# Patient Record
Sex: Female | Born: 1995 | Race: Black or African American | Hispanic: No | Marital: Single | Smoking: Never smoker
Health system: Southern US, Community
[De-identification: ages and names within clinical notes are randomized; demographics above are authoritative.]

## PROBLEM LIST (undated history)

## (undated) ENCOUNTER — Inpatient Hospital Stay (HOSPITAL_COMMUNITY): Payer: Self-pay

## (undated) ENCOUNTER — Emergency Department (HOSPITAL_COMMUNITY): Discharge status: Against Medical Advice | Payer: Medicaid Other

## (undated) DIAGNOSIS — R569 Unspecified convulsions: Secondary | ICD-10-CM

## (undated) DIAGNOSIS — A749 Chlamydial infection, unspecified: Secondary | ICD-10-CM

## (undated) DIAGNOSIS — K8020 Calculus of gallbladder without cholecystitis without obstruction: Secondary | ICD-10-CM

## (undated) DIAGNOSIS — K219 Gastro-esophageal reflux disease without esophagitis: Secondary | ICD-10-CM

## (undated) DIAGNOSIS — F319 Bipolar disorder, unspecified: Secondary | ICD-10-CM

## (undated) DIAGNOSIS — F445 Conversion disorder with seizures or convulsions: Secondary | ICD-10-CM

## (undated) DIAGNOSIS — F419 Anxiety disorder, unspecified: Secondary | ICD-10-CM

## (undated) DIAGNOSIS — D649 Anemia, unspecified: Secondary | ICD-10-CM

## (undated) HISTORY — DX: Conversion disorder with seizures or convulsions: F44.5

## (undated) HISTORY — PX: CHOLECYSTECTOMY: SHX55

## (undated) HISTORY — DX: Unspecified convulsions: R56.9

---
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
## 2014-01-24 ENCOUNTER — Other Ambulatory Visit: Payer: Self-pay

## 2014-01-24 ENCOUNTER — Emergency Department (HOSPITAL_COMMUNITY)
Admission: EM | Admit: 2014-01-24 | Discharge: 2014-01-24 | Disposition: A | Discharge status: Home / Self Care | Payer: Medicaid Other | Attending: Emergency Medicine | Admitting: Emergency Medicine

## 2014-01-24 ENCOUNTER — Encounter (HOSPITAL_COMMUNITY): Payer: Self-pay | Admitting: Emergency Medicine

## 2014-01-24 DIAGNOSIS — Z862 Personal history of diseases of the blood and blood-forming organs and certain disorders involving the immune mechanism: Secondary | ICD-10-CM | POA: Diagnosis not present

## 2014-01-24 DIAGNOSIS — N3289 Other specified disorders of bladder: Secondary | ICD-10-CM | POA: Diagnosis not present

## 2014-01-24 DIAGNOSIS — Z792 Long term (current) use of antibiotics: Secondary | ICD-10-CM | POA: Diagnosis not present

## 2014-01-24 DIAGNOSIS — Z8659 Personal history of other mental and behavioral disorders: Secondary | ICD-10-CM | POA: Insufficient documentation

## 2014-01-24 DIAGNOSIS — Z3202 Encounter for pregnancy test, result negative: Secondary | ICD-10-CM | POA: Insufficient documentation

## 2014-01-24 DIAGNOSIS — N390 Urinary tract infection, site not specified: Secondary | ICD-10-CM | POA: Diagnosis not present

## 2014-01-24 DIAGNOSIS — R350 Frequency of micturition: Secondary | ICD-10-CM | POA: Diagnosis present

## 2014-01-24 LAB — URINALYSIS, ROUTINE W REFLEX MICROSCOPIC
Glucose, UA: NEGATIVE mg/dL
Ketones, ur: 15 mg/dL — AB
Nitrite: NEGATIVE
PH: 5.5 (ref 5.0–8.0)
Protein, ur: 100 mg/dL — AB
Specific Gravity, Urine: 1.027 (ref 1.005–1.030)
Urobilinogen, UA: 1 mg/dL (ref 0.0–1.0)

## 2014-01-24 LAB — WET PREP, GENITAL
TRICH WET PREP: NONE SEEN
Yeast Wet Prep HPF POC: NONE SEEN

## 2014-01-24 LAB — PREGNANCY, URINE: Preg Test, Ur: NEGATIVE

## 2014-01-24 LAB — URINE MICROSCOPIC-ADD ON

## 2014-01-24 MED ORDER — IBUPROFEN 200 MG PO TABS
400.0000 mg | ORAL_TABLET | Freq: Once | ORAL | Status: AC
  Administered 2014-01-24: 400 mg via ORAL
  Filled 2014-01-24: qty 2

## 2014-01-24 MED ORDER — CEPHALEXIN 500 MG PO CAPS: 500.0000 mg | ORAL_CAPSULE | Freq: Two times a day (BID) | ORAL | Status: DC

## 2014-01-24 NOTE — Discharge Instructions (Signed)

## 2014-01-24 NOTE — ED Notes (Signed)
The pt is c/o bladder pain for 2 days.  Painful urination.  The pt reports that she feels like it is beating inside .  Her urine has changed color.  lmp last month

## 2014-01-24 NOTE — ED Provider Notes (Signed)
CSN: [id number]     Arrival date & time 01/24/14  0154 History   First MD Initiated Contact with Patient 01/24/14 [phone]     Chief Complaint  Patient presents with  . poss uti     (Consider location/radiation/quality/duration/timing/severity/associated sxs/prior Treatment) HPI Comments: Patient is a 18 year old female who presents to the emergency department for suprapubic discomfort x2 days. Patient states that she has felt a "thumping" in her suprapubic region. Patient denies dysuria, but states that she has had urinary frequency and urgency. She also states she has been having vaginal discharge associated with her symptoms. The patient denies fever, chest pain, shortness of breath, back pain, nausea or vomiting, diarrhea, melena or hematochezia, hematuria, and numbness/weakness. Patient states she is sexually active with one partner and does not use condoms. She denies concern for STDs  The history is provided by the patient. No language interpreter was used.    Past Medical History  Diagnosis Date  . Anemia   . Seizure     pseudoseizures  . Depression   . Anxiety     no meds during pregnancy   Past Surgical History  Procedure Laterality Date  . Wisdom tooth extraction     Family History  Problem Relation Age of Onset  . Arthritis Mother   . Bronchitis Mother   . Asthma Mother    History  Substance Use Topics  . Smoking status: Never Smoker   . Smokeless tobacco: Never Used  . Alcohol Use: No   OB History   Grav Para Term Preterm Abortions TAB SAB Ect Mult Living   1 1 1  0 0 0 0 0 0 1      Review of Systems  Gastrointestinal: Positive for abdominal pain (suprapubic).  Genitourinary: Positive for urgency, frequency and vaginal discharge.  All other systems reviewed and are negative.    Allergies  Review of patient's allergies indicates no known allergies.  Home Medications   Prior to Admission medications   Medication Sig Start Date End Date Taking?  Authorizing Provider  medroxyPROGESTERone (DEPO-PROVERA) 150 MG/ML injection Inject 1 mL (150 mg total) into the muscle every 3 (three) months. 11/23/13  Yes Amy Dessa PhiHowell Wren, CNM  cephALEXin (KEFLEX) 500 MG capsule Take 1 capsule (500 mg total) by mouth 2 (two) times daily. 01/24/14   Antony MaduraKelly Nan Maya, PA-C   BP 111/71  Pulse 78  Temp(Src) 98.8 F (37.1 C) (Oral)  Resp 18  SpO2 100%  Physical Exam  Nursing note and vitals reviewed. Constitutional: She is oriented to person, place, and time. She appears well-developed and well-nourished. No distress.  Nontoxic/nonseptic appearing  HENT:  Head: Normocephalic and atraumatic.  Eyes: Conjunctivae and EOM are normal. No scleral icterus.  Neck: Normal range of motion.  Cardiovascular: Normal rate, regular rhythm and intact distal pulses.   Distal radial pulse 2+ in right upper extremity  Pulmonary/Chest: Effort normal. No respiratory distress. She has no wheezes.  Abdominal: Soft. She exhibits no distension. There is no tenderness. There is no rebound and no guarding.  Abdomen soft. No masses or tenderness.  Genitourinary: There is no rash, tenderness, lesion or injury on the right labia. There is no rash, tenderness, lesion or injury on the left labia. Uterus is tender (mild). Cervix exhibits no motion tenderness and no friability. Right adnexum displays no mass, no tenderness and no fullness. Left adnexum displays no mass, no tenderness and no fullness. No signs of injury around the vagina. No vaginal discharge found.  Musculoskeletal: Normal  range of motion.  Neurological: She is alert and oriented to person, place, and time.  GCS 15. Patient moves extremities without ataxia.  Skin: Skin is warm and dry. No rash noted. She is not diaphoretic. No erythema. No pallor.  Psychiatric: She has a normal mood and affect. Her behavior is normal.    ED Course  Procedures (including critical care time) Labs Review Labs Reviewed  WET PREP, GENITAL -  Abnormal; Notable for the following:    Clue Cells Wet Prep HPF POC FEW (*)    WBC, Wet Prep HPF POC FEW (*)    All other components within normal limits  URINALYSIS, ROUTINE W REFLEX MICROSCOPIC - Abnormal; Notable for the following:    Color, Urine AMBER (*)    APPearance TURBID (*)    Hgb urine dipstick LARGE (*)    Bilirubin Urine SMALL (*)    Ketones, ur 15 (*)    Protein, ur 100 (*)    Leukocytes, UA LARGE (*)    All other components within normal limits  URINE CULTURE  GC/CHLAMYDIA PROBE AMP  PREGNANCY, URINE  URINE MICROSCOPIC-ADD ON    Imaging Review No results found.   EKG Interpretation None      MDM   Final diagnoses:  UTI (lower urinary tract infection)  Bladder spasms    18 year old female presents to the emergency department for "thumping" in her suprapubic region associated with urinary frequency and urgency. Patient also endorsing vaginal discharge. No fevers, nausea, vomiting, back pain, or pelvic pain. Patient with soft abdomen on physical exam. No masses or peritoneal signs. Urinalysis with large number of leukocytes and red blood cells. Wet prep only with few white blood cells. Workup in physical exam consistent with urinary tract infection with bladder spasms. Hemorrhagic cystitis likely the cause of microscopic hematuria today.  Patient stable and appropriate for discharge with instruction to take Keflex and followup with her primary care doctor. Return precautions provided and patient agreeable to plan with no unaddressed concerns.   Filed Vitals:   01/24/14 0200 01/24/14 0345 01/24/14 0400  BP: 121/49 107/68 111/71  Pulse: 98 73 78  Temp: 98.8 F (37.1 C)    TempSrc: Oral    Resp: 18    SpO2: 99% 100% 100%     Antony MaduraKelly Yi Haugan, PA-C 01/24/14 [phone]

## 2014-01-25 LAB — GC/CHLAMYDIA PROBE AMP
CT Probe RNA: POSITIVE — AB
GC PROBE AMP APTIMA: NEGATIVE

## 2014-01-25 LAB — URINE CULTURE: Colony Count: 2000

## 2014-01-26 ENCOUNTER — Telehealth (HOSPITAL_BASED_OUTPATIENT_CLINIC_OR_DEPARTMENT_OTHER): Payer: Self-pay

## 2014-01-26 NOTE — Telephone Encounter (Signed)
Results received from [facility].  (+) Chlamydia.  No antibiotic treatment or Prescription given for STD.  Chart to MD office for review.  [facility]HS form attached. 

## 2014-01-26 NOTE — ED Provider Notes (Signed)
Medical screening examination/treatment/procedure(s) were performed by non-physician practitioner and as supervising physician I was immediately available for consultation/collaboration.   EKG Interpretation None       Donn Wilmot, MD 01/26/14 0408 

## 2014-01-28 NOTE — Telephone Encounter (Signed)
Chart returned from EDP office. Per Linwood DibblesJon Knapp MD, give Zithromax 1 gram PO x once.

## 2014-01-29 ENCOUNTER — Telehealth (HOSPITAL_BASED_OUTPATIENT_CLINIC_OR_DEPARTMENT_OTHER): Payer: Self-pay | Admitting: *Deleted

## 2014-01-29 NOTE — Telephone Encounter (Signed)
Zithromax 1 gram PO x 1 [phone]) per pt request.

## 2014-01-31 ENCOUNTER — Ambulatory Visit: Payer: Medicaid Other | Admitting: Obstetrics & Gynecology

## 2014-02-02 ENCOUNTER — Encounter: Payer: Self-pay | Admitting: Obstetrics & Gynecology

## 2014-02-02 ENCOUNTER — Ambulatory Visit (INDEPENDENT_AMBULATORY_CARE_PROVIDER_SITE_OTHER): Payer: Medicaid Other | Admitting: Obstetrics & Gynecology

## 2014-02-02 VITALS — BP 113/75 | HR 70 | Temp 98.4°F | Wt 88.0 lb

## 2014-02-02 DIAGNOSIS — N730 Acute parametritis and pelvic cellulitis: Secondary | ICD-10-CM

## 2014-02-02 MED ORDER — METRONIDAZOLE 500 MG PO TABS: 500.0000 mg | ORAL_TABLET | Freq: Two times a day (BID) | ORAL | Status: DC

## 2014-02-02 MED ORDER — AZITHROMYCIN 250 MG PO TABS: 1000.0000 mg | ORAL_TABLET | Freq: Once | ORAL | Status: DC

## 2014-02-02 MED ORDER — CEFTRIAXONE SODIUM 1 G IJ SOLR
250.0000 mg | Freq: Once | INTRAMUSCULAR | Status: AC
  Administered 2014-02-02: 250 mg via INTRAMUSCULAR

## 2014-02-02 NOTE — Patient Instructions (Signed)
Pelvic Inflammatory Disease °Pelvic inflammatory disease (PID) refers to an infection in some or all of the female organs. The infection can be in the uterus, ovaries, fallopian tubes, or the surrounding tissues in the pelvis. PID can cause abdominal or pelvic pain that comes on suddenly (acute pelvic pain). PID is a serious infection because it can lead to lasting (chronic) pelvic pain or the inability to have children (infertile).  °CAUSES  °The infection is often caused by the normal bacteria found in the vaginal tissues. PID may also be caused by an infection that is spread during sexual contact. PID can also occur following:  °· The birth of a baby.   °· A miscarriage.   °· An abortion.   °· Major pelvic surgery.   °· The use of an intrauterine device (IUD).   °· A sexual assault.   °RISK FACTORS °Certain factors can put a person at higher risk for PID, such as: °· Being younger than 25 years. °· Being sexually active at a young age. °· Using nonbarrier contraception. °· Having multiple sexual partners. °· Having sex with someone who has symptoms of a genital infection. °· Using oral contraception. °Other times, certain behaviors can increase the possibility of getting PID, such as: °· Having sex during your period. °· Using a vaginal douche. °· Having an intrauterine device (IUD) in place. °SYMPTOMS  °· Abdominal or pelvic pain.   °· Fever.   °· Chills.   °· Abnormal vaginal discharge. °· Abnormal uterine bleeding.   °· Unusual pain shortly after finishing your period. °DIAGNOSIS  °Your caregiver will choose some of the following methods to make a diagnosis, such as:  °· Performing a physical exam and history. A pelvic exam typically reveals a very tender uterus and surrounding pelvis.   °· Ordering laboratory tests including a pregnancy test, blood tests, and urine test.  °· Ordering cultures of the vagina and cervix to check for a sexually transmitted infection (STI). °· Performing an ultrasound.    °· Performing a laparoscopic procedure to look inside the pelvis.   °TREATMENT  °· Antibiotic medicines may be prescribed and taken by mouth.   °· Sexual partners may be treated when the infection is caused by a sexually transmitted disease (STD).   °· Hospitalization may be needed to give antibiotics intravenously. °· Surgery may be needed, but this is rare. °It may take weeks until you are completely well. If you are diagnosed with PID, you should also be checked for human immunodeficiency virus (HIV).   °HOME CARE INSTRUCTIONS  °· If given, take your antibiotics as directed. Finish the medicine even if you start to feel better.   °· Only take over-the-counter or prescription medicines for pain, discomfort, or fever as directed by your caregiver.   °· Do not have sexual intercourse until treatment is completed or as directed by your caregiver. If PID is confirmed, your recent sexual partner(s) will need treatment.   °· Keep your follow-up appointments. °SEEK MEDICAL CARE IF:  °· You have increased or abnormal vaginal discharge.   °· You need prescription medicine for your pain.   °· You vomit.   °· You cannot take your medicines.   °· Your partner has an STD.   °SEEK IMMEDIATE MEDICAL CARE IF:  °· You have a fever.   °· You have increased abdominal or pelvic pain.   °· You have chills.   °· You have pain when you urinate.   °· You are not better after 72 hours following treatment.   °MAKE SURE YOU:  °· Understand these instructions. °· Will watch your condition. °· Will get help right away if you are not doing well or get worse. °  Document Released: 07/08/2005 Document Revised: 11/02/2012 Document Reviewed: 07/04/2011 °ExitCare® Patient Information ©2015 [employer]. This information is not intended to replace advice given to you by your health care provider. Make sure you discuss any questions you have with your health care provider. ° °

## 2014-02-02 NOTE — Progress Notes (Signed)
Patient ID: Leah [name], female   DOB: Dec 04, 1995, 18 y.o.   MRN: [id number]  Chief Complaint  Patient presents with  . Follow-up    Uti; pelvic pain    HPI Leah [name] is [name] 18 y.o. female.  See above.  She presented to an ED with similar complaints on 7/6.  [name] CT probe was positive.  She was treated for [name] UTI  HPI  Past Medical History  Diagnosis Date  . Anemia   . Seizure     pseudoseizures  . Depression   . Anxiety     no meds during pregnancy    Past Surgical History  Procedure Laterality Date  . Wisdom tooth extraction      Family History  Problem Relation Age of Onset  . Arthritis Mother   . Bronchitis Mother   . Asthma Mother     Social History History  Substance Use Topics  . Smoking status: Never Smoker   . Smokeless tobacco: Never Used  . Alcohol Use: No    No Known Allergies  Current Outpatient Prescriptions  Medication Sig Dispense Refill  . cephALEXin (KEFLEX) 500 MG capsule Take 1 capsule (500 mg total) by mouth 2 (two) times daily.  14 capsule  0  . medroxyPROGESTERone (DEPO-PROVERA) 150 MG/ML injection Inject 1 mL (150 mg total) into the muscle every 3 (three) months.  1 mL  5   Current Facility-Administered Medications  Medication Dose Route Frequency Provider Last Rate Last Dose  . medroxyPROGESTERone (DEPO-PROVERA) injection 150 mg  150 mg Intramuscular Q90 days Amy Dessa PhiHowell Wren, CNM   150 mg at 12/07/13 0932    Review of Systems Review of Systems Constitutional: negative for fatigue and weight loss Respiratory: negative for cough and wheezing Cardiovascular: negative for chest pain, fatigue and palpitations Gastrointestinal: negative for abdominal pain and change in bowel habits Genitourinary: positive for pelvic pain Integument/breast: negative for nipple discharge Musculoskeletal:negative for myalgias Neurological: negative for gait problems and tremors Behavioral/Psych: negative for abusive relationship, depression Endocrine:  negative for temperature intolerance     Blood pressure 113/75, pulse 70, temperature 98.4 F (36.9 C), weight 39.917 kg (88 lb), last menstrual period 01/03/2014, not currently breastfeeding.  Physical Exam Physical Exam General:   alert    50% of 15 min visit spent on counseling and coordination of care.   Data Reviewed Labs, notes, UPT  Assessment    Pelvic pain, R/O PID     Plan   Possible management options include: outpatient treatment for PID Follow up in 1 mth         JACKSON-MOORE,Leah [name] 02/02/2014, 3:06 PM

## 2014-02-02 NOTE — Addendum Note (Signed)
Addended by: Marya LandryFOSTER, SUZANNE D on: 02/02/2014 03:33 PM   Modules accepted: Orders

## 2014-02-08 ENCOUNTER — Encounter (HOSPITAL_COMMUNITY): Payer: Self-pay | Admitting: Emergency Medicine

## 2014-02-08 ENCOUNTER — Emergency Department (HOSPITAL_COMMUNITY)
Admission: EM | Admit: 2014-02-08 | Discharge: 2014-02-09 | Disposition: A | Discharge status: Home / Self Care | Payer: Medicaid Other | Attending: Emergency Medicine | Admitting: Emergency Medicine

## 2014-02-08 DIAGNOSIS — R110 Nausea: Secondary | ICD-10-CM | POA: Diagnosis not present

## 2014-02-08 DIAGNOSIS — K8020 Calculus of gallbladder without cholecystitis without obstruction: Secondary | ICD-10-CM | POA: Diagnosis not present

## 2014-02-08 DIAGNOSIS — Z3202 Encounter for pregnancy test, result negative: Secondary | ICD-10-CM | POA: Insufficient documentation

## 2014-02-08 DIAGNOSIS — R197 Diarrhea, unspecified: Secondary | ICD-10-CM | POA: Diagnosis not present

## 2014-02-08 DIAGNOSIS — Z79899 Other long term (current) drug therapy: Secondary | ICD-10-CM | POA: Insufficient documentation

## 2014-02-08 DIAGNOSIS — R1011 Right upper quadrant pain: Secondary | ICD-10-CM | POA: Diagnosis present

## 2014-02-08 DIAGNOSIS — Z8659 Personal history of other mental and behavioral disorders: Secondary | ICD-10-CM | POA: Insufficient documentation

## 2014-02-08 DIAGNOSIS — Z862 Personal history of diseases of the blood and blood-forming organs and certain disorders involving the immune mechanism: Secondary | ICD-10-CM | POA: Diagnosis not present

## 2014-02-08 DIAGNOSIS — K8050 Calculus of bile duct without cholangitis or cholecystitis without obstruction: Secondary | ICD-10-CM

## 2014-02-08 DIAGNOSIS — R1013 Epigastric pain: Secondary | ICD-10-CM | POA: Insufficient documentation

## 2014-02-08 NOTE — ED Notes (Signed)
Pt is c/o abd pain, back pain, flank pain, and diarrhea  Pt states her back and kidneys having been hurting for a while but her diarrhea started 2 days ago  Pt states she was seen by her dr on Wednesday for follow up after being treated for an STD and is still on one of the medications they gave her   Pt states she has nausea without vomiting

## 2014-02-09 ENCOUNTER — Emergency Department (HOSPITAL_COMMUNITY): Payer: Medicaid Other

## 2014-02-09 LAB — COMPREHENSIVE METABOLIC PANEL
ALBUMIN: 4.1 g/dL (ref 3.5–5.2)
ALT: 17 U/L (ref 0–35)
ANION GAP: 13 (ref 5–15)
AST: 20 U/L (ref 0–37)
Alkaline Phosphatase: 56 U/L (ref 39–117)
BILIRUBIN TOTAL: 0.5 mg/dL (ref 0.3–1.2)
BUN: 14 mg/dL (ref 6–23)
CO2: 21 meq/L (ref 19–32)
Calcium: 9.3 mg/dL (ref 8.4–10.5)
Chloride: 106 mEq/L (ref 96–112)
Creatinine, Ser: 0.66 mg/dL (ref 0.50–1.10)
GFR calc Af Amer: >90 mL/min (ref >90)
GFR calc non Af Amer: >90 mL/min (ref >90)
Glucose, Bld: 90 mg/dL (ref 70–99)
Potassium: 3.5 mEq/L — ABNORMAL LOW (ref 3.7–5.3)
Sodium: 140 mEq/L (ref 137–147)
Total Protein: 7.3 g/dL (ref 6.0–8.3)

## 2014-02-09 LAB — CBC WITH DIFFERENTIAL/PLATELET
BASOS PCT: 0 % (ref 0–1)
Basophils Absolute: 0 10*3/uL (ref 0.0–0.1)
Eosinophils Absolute: 0.1 10*3/uL (ref 0.0–0.7)
Eosinophils Relative: 1 % (ref 0–5)
HCT: 35.5 % — ABNORMAL LOW (ref 36.0–46.0)
HEMOGLOBIN: 12 g/dL (ref 12.0–15.0)
LYMPHS PCT: 41 % (ref 12–46)
Lymphs Abs: 3 10*3/uL (ref 0.7–4.0)
MCH: 28.8 pg (ref 26.0–34.0)
MCHC: 33.8 g/dL (ref 30.0–36.0)
MCV: 85.1 fL (ref 78.0–100.0)
MONOS PCT: 6 % (ref 3–12)
Monocytes Absolute: 0.5 10*3/uL (ref 0.1–1.0)
NEUTROS PCT: 52 % (ref 43–77)
Neutro Abs: 3.8 10*3/uL (ref 1.7–7.7)
Platelets: 260 10*3/uL (ref 150–400)
RBC: 4.17 MIL/uL (ref 3.87–5.11)
RDW: 12.7 % (ref 11.5–15.5)
WBC: 7.3 10*3/uL (ref 4.0–10.5)

## 2014-02-09 LAB — URINE MICROSCOPIC-ADD ON

## 2014-02-09 LAB — POC URINE PREG, ED: Preg Test, Ur: NEGATIVE

## 2014-02-09 LAB — URINALYSIS, ROUTINE W REFLEX MICROSCOPIC
Bilirubin Urine: NEGATIVE
GLUCOSE, UA: NEGATIVE mg/dL
HGB URINE DIPSTICK: NEGATIVE
Ketones, ur: NEGATIVE mg/dL
Nitrite: NEGATIVE
Protein, ur: NEGATIVE mg/dL
SPECIFIC GRAVITY, URINE: 1.027 (ref 1.005–1.030)
Urobilinogen, UA: 0.2 mg/dL (ref 0.0–1.0)
pH: 6 (ref 5.0–8.0)

## 2014-02-09 LAB — LIPASE, BLOOD: Lipase: 38 U/L (ref 11–59)

## 2014-02-09 MED ORDER — ONDANSETRON HCL 4 MG PO TABS: 4.0000 mg | ORAL_TABLET | Freq: Four times a day (QID) | ORAL | Status: DC

## 2014-02-09 MED ORDER — HYDROCODONE-ACETAMINOPHEN 5-325 MG PO TABS: 2.0000 | ORAL_TABLET | ORAL | Status: DC | PRN

## 2014-02-09 MED ORDER — MORPHINE SULFATE 4 MG/ML IJ SOLN
2.0000 mg | Freq: Once | INTRAMUSCULAR | Status: AC
  Administered 2014-02-09: 2 mg via INTRAVENOUS
  Filled 2014-02-09: qty 1

## 2014-02-09 MED ORDER — SODIUM CHLORIDE 0.9 % IV BOLUS (SEPSIS)
1000.0000 mL | Freq: Once | INTRAVENOUS | Status: AC
  Administered 2014-02-09: 1000 mL via INTRAVENOUS

## 2014-02-09 MED ORDER — HYDROCODONE-ACETAMINOPHEN 5-325 MG PO TABS
2.0000 | ORAL_TABLET | Freq: Once | ORAL | Status: AC
  Administered 2014-02-09: 2 via ORAL
  Filled 2014-02-09: qty 2

## 2014-02-09 MED ORDER — ONDANSETRON HCL 4 MG/2ML IJ SOLN
4.0000 mg | INTRAMUSCULAR | Status: AC
  Administered 2014-02-09: 4 mg via INTRAVENOUS
  Filled 2014-02-09: qty 2

## 2014-02-09 MED ORDER — HYDROCODONE-ACETAMINOPHEN 5-325 MG PO TABS: 1.0000 | ORAL_TABLET | Freq: Four times a day (QID) | ORAL | Status: DC | PRN

## 2014-02-09 NOTE — Discharge Instructions (Signed)
Biliary Colic  °Biliary colic is a steady or irregular pain in the upper abdomen. It is usually under the right side of the rib cage. It happens when gallstones interfere with the normal flow of bile from the gallbladder. Bile is a liquid that helps to digest fats. Bile is made in the liver and stored in the gallbladder. When you eat a meal, bile passes from the gallbladder through the cystic duct and the common bile duct into the small intestine. There, it mixes with partially digested food. If a gallstone blocks either of these ducts, the normal flow of bile is blocked. The muscle cells in the bile duct contract forcefully to try to move the stone. This causes the pain of biliary colic.  °SYMPTOMS  °· A person with biliary colic usually complains of pain in the upper abdomen. This pain can be: °¨ In the center of the upper abdomen just below the breastbone. °¨ In the upper-right part of the abdomen, near the gallbladder and liver. °¨ Spread back toward the right shoulder blade. °· Nausea and vomiting. °· The pain usually occurs after eating. °· Biliary colic is usually triggered by the digestive system's demand for bile. The demand for bile is high after fatty meals. Symptoms can also occur when a person who has been fasting suddenly eats a very large meal. Most episodes of biliary colic pass after 1 to 5 hours. After the most intense pain passes, your abdomen may continue to ache mildly for about 24 hours. °DIAGNOSIS  °After you describe your symptoms, your caregiver will perform a physical exam. He or she will pay attention to the upper right portion of your belly (abdomen). This is the area of your liver and gallbladder. An ultrasound will help your caregiver look for gallstones. Specialized scans of the gallbladder may also be done. Blood tests may be done, especially if you have fever or if your pain persists. °PREVENTION  °Biliary colic can be prevented by controlling the risk factors for gallstones. Some of  these risk factors, such as heredity, increasing age, and pregnancy are a normal part of life. Obesity and a high-fat diet are risk factors you can change through a healthy lifestyle. Women going through menopause who take hormone replacement therapy (estrogen) are also more likely to develop biliary colic. °TREATMENT  °· Pain medication may be prescribed. °· You may be encouraged to eat a fat-free diet. °· If the first episode of biliary colic is severe, or episodes of colic keep retuning, surgery to remove the gallbladder (cholecystectomy) is usually recommended. This procedure can be done through small incisions using an instrument called a laparoscope. The procedure often requires a brief stay in the hospital. Some people can leave the hospital the same day. It is the most widely used treatment in people troubled by painful gallstones. It is effective and safe, with no complications in more than 90% of cases. °· If surgery cannot be done, medication that dissolves gallstones may be used. This medication is expensive and can take months or years to work. Only small stones will dissolve. °· Rarely, medication to dissolve gallstones is combined with a procedure called shock-wave lithotripsy. This procedure uses carefully aimed shock waves to break up gallstones. In many people treated with this procedure, gallstones form again within a few years. °PROGNOSIS  °If gallstones block your cystic duct or common bile duct, you are at risk for repeated episodes of biliary colic. There is also a 25% chance that you will develop   a gallbladder infection(acute cholecystitis), or some other complication of gallstones within 10 to 20 years. If you have surgery, schedule it at a time that is convenient for you and at a time when you are not sick. °HOME CARE INSTRUCTIONS  °· Drink plenty of clear fluids. °· Avoid fatty, greasy or fried foods, or any foods that make your pain worse. °· Take medications as directed. °SEEK MEDICAL  CARE IF:  °· You develop a fever over 100.5° F (38.1° C). °· Your pain gets worse over time. °· You develop nausea that prevents you from eating and drinking. °· You develop vomiting. °SEEK IMMEDIATE MEDICAL CARE IF:  °· You have continuous or severe belly (abdominal) pain which is not relieved with medications. °· You develop nausea and vomiting which is not relieved with medications. °· You have symptoms of biliary colic and you suddenly develop a fever and shaking chills. This may signal cholecystitis. Call your caregiver immediately. °· You develop a yellow color to your skin or the white part of your eyes (jaundice). °Document Released: 12/09/2005 Document Revised: 09/30/2011 Document Reviewed: 02/18/2008 °ExitCare® Patient Information ©2015 [employer]. This information is not intended to replace advice given to you by your health care provider. Make sure you discuss any questions you have with your health care provider. ° °

## 2014-02-09 NOTE — ED Notes (Signed)
Pt is notified that urine is needed for a sample.

## 2014-02-09 NOTE — ED Notes (Signed)
US at bedside

## 2014-02-09 NOTE — ED Provider Notes (Signed)
CSN: [id number]     Arrival date & time 02/08/14  2301 History   First MD Initiated Contact with Patient 02/09/14 0047     Chief Complaint  Patient presents with  . Abdominal Pain  . Back Pain  . Diarrhea    (Consider location/radiation/quality/duration/timing/severity/associated sxs/prior Treatment) HPI Comments: Patient is a [age] y/o female with a hx of anemia, depression and anxiety. She presents to the ED today for abdominal pain x2 days. Patient states that her pain is in her epigastric and right upper quadrant. She states that it intermittently radiates to her back and to her right flank. Patient states the symptoms became associated with diarrhea 2 days ago. She states her diarrhea has been watery and nonbloody. She also endorses associated nausea with worsening pain. Denies vomiting. Patient states she was also seen by her primary doctor one week ago for STD check and follow up. She is currently on Flagyl for coverage of PID. Patient, today, denies associated fever, CP, SOB, melena, hematochezia, urinary symptoms, vaginal bleeding/discharge, and rashes.  The history is provided by the patient. No language interpreter was used.    Past Medical History  Diagnosis Date  . Anemia   . Seizure     pseudoseizures  . Depression   . Anxiety     no meds during pregnancy   Past Surgical History  Procedure Laterality Date  . Wisdom tooth extraction     Family History  Problem Relation Age of Onset  . Arthritis Mother   . Bronchitis Mother   . Asthma Mother    History  Substance Use Topics  . Smoking status: Never Smoker   . Smokeless tobacco: Never Used  . Alcohol Use: No   OB History   Grav Para Term Preterm Abortions TAB SAB Ect Mult Living   1 1 1  0 0 0 0 0 0 1      Review of Systems  Constitutional: Negative for fever.  Respiratory: Negative for shortness of breath.   Cardiovascular: Negative for chest pain.  Gastrointestinal: Positive for nausea, abdominal pain and  diarrhea. Negative for blood in stool.  Genitourinary: Negative for dysuria, hematuria, vaginal bleeding, vaginal discharge and pelvic pain.  Neurological: Negative for syncope, weakness and numbness.  All other systems reviewed and are negative.    Allergies  Review of patient's allergies indicates no known allergies.  Home Medications   Prior to Admission medications   Medication Sig Start Date End Date Taking? Authorizing Provider  acetaminophen (TYLENOL) 500 MG tablet Take 500 mg by mouth every 6 (six) hours as needed for mild pain.   Yes Historical Provider, MD  medroxyPROGESTERone (DEPO-PROVERA) 150 MG/ML injection Inject 1 mL (150 mg total) into the muscle every 3 (three) months. 11/23/13  Yes Amy Dessa PhiHowell Wren, CNM  metroNIDAZOLE (FLAGYL) 500 MG tablet Take 1 tablet (500 mg total) by mouth 2 (two) times daily. For 14 days 02/02/14   Antionette CharLisa Jackson-Moore, MD   BP 110/55  Pulse 77  Temp(Src) 98.7 F (37.1 C) (Oral)  Resp 18  Wt 84 lb (38.102 kg)  SpO2 100%  LMP 01/03/2014  Physical Exam  Nursing note and vitals reviewed. Constitutional: She is oriented to person, place, and time. She appears well-developed and well-nourished. No distress.  Nontoxic/nonseptic appearing. Patient in no visible or audible discomfort.  HENT:  Head: Normocephalic and atraumatic.  Mouth/Throat: Oropharynx is clear and moist. No oropharyngeal exudate.  Eyes: Conjunctivae and EOM are normal. No scleral icterus.  Neck: Normal range  of motion.  Cardiovascular: Normal rate, regular rhythm and normal heart sounds.   Pulmonary/Chest: Effort normal and breath sounds normal. No respiratory distress. She has no wheezes. She has no rales.  Chest expansion symmetric.  Abdominal: Soft. She exhibits no distension. There is tenderness. There is no rebound and no guarding.  Mild TTP in RUQ and epigastric region. No peritoneal signs or masses. No abdominal distension  Musculoskeletal: Normal range of motion.   Neurological: She is alert and oriented to person, place, and time. She exhibits normal muscle tone. Coordination normal.  Skin: Skin is warm and dry. No rash noted. She is not diaphoretic. No erythema. No pallor.  Psychiatric: She has a normal mood and affect. Her behavior is normal.    ED Course  Procedures (including critical care time) Labs Review Labs Reviewed  CBC WITH DIFFERENTIAL - Abnormal; Notable for the following:    HCT 35.5 (*)    All other components within normal limits  COMPREHENSIVE METABOLIC PANEL - Abnormal; Notable for the following:    Potassium 3.5 (*)    All other components within normal limits  URINALYSIS, ROUTINE W REFLEX MICROSCOPIC - Abnormal; Notable for the following:    Leukocytes, UA MODERATE (*)    All other components within normal limits  URINE MICROSCOPIC-ADD ON - Abnormal; Notable for the following:    Squamous Epithelial / LPF FEW (*)    All other components within normal limits  LIPASE, BLOOD  POC URINE PREG, ED    Imaging Review US Abdomen Complete  02/09/2014   CLINICAL DATA:  Abdominal pain, nausea, vomiting and diarrhea.  EXAM: ULTRASOUND ABDOMEN COMPLETE  COMPARISON:  Abdominal ultrasound performed 09/19/2010  FINDINGS: Gallbladder:  A few stones are seen dependently within the gallbladder. The gallbladder is otherwise unremarkable. No gallbladder wall thickening or pericholecystic fluid is seen. No ultrasonographic Murphy's sign is elicited.  Common bile duct:  Diameter: 0.2 cm, within normal limits in caliber.  Liver:  No focal lesion identified. Within normal limits in parenchymal echogenicity.  IVC:  No abnormality visualized.  Pancreas:  Visualized portion unremarkable.  Spleen:  Size and appearance within normal limits.  Right Kidney:  Length: 9.3 cm. Echogenicity within normal limits. No mass or hydronephrosis visualized.  Left Kidney:  Length: 9.3 cm. Echogenicity within normal limits. No mass or hydronephrosis visualized.  Abdominal  aorta:  No aneurysm visualized.  Other findings:  None.  IMPRESSION: 1. No acute abnormality seen within the abdomen. 2. Cholelithiasis; gallbladder otherwise unremarkable in appearance.   Electronically Signed   By: Roanna Raider M.D.   On: 02/09/2014 02:39     EKG Interpretation None      MDM   Final diagnoses:  Gallstones  Biliary colic    18 year old female presents to the emergency department for epigastric and right upper quadrant abdominal pain with associated nausea and diarrhea. Patient currently on Flagyl for coverage for PID. She denies complaint of pelvic pain, vaginal bleeding, or vaginal discharge. Symptoms ongoing x2 days and intermittent. Labs today show no leukocytosis, anemia, or electrolyte imbalance. Liver function preserved. Lipase normal and urinalysis did not suggest infection. Urine pregnancy negative.  Imaging obtained further evaluation of symptoms which shows cholelithiasis without evidence of cholecystitis. Symptoms likely secondary to biliary colic. Also possible that diarrhea is a side effect of pain taking Flagyl rather than being related to her gallbladder. His abdominal reexaminations over ED course the stable. Pain well controlled with morphine. The patient is appropriate for followup as outpatient. Will  manage symptoms with Norco and Zofran. Return precautions discussed and provided. Patient agreeable to plan with no unaddressed concerns.   Filed Vitals:   02/08/14 2323 02/09/14 0255 02/09/14 0419  BP: 98/52 110/55 110/56  Pulse: 78 77 68  Temp: 98.5 F (36.9 C) 98.7 F (37.1 C) 98.6 F (37 C)  TempSrc: Oral Oral Oral  Resp: 16 18 18   Weight: 84 lb (38.102 kg)    SpO2: 99% 100% 100%      Antony Madura, PA-C 02/13/14 [phone]

## 2014-02-14 NOTE — ED Provider Notes (Signed)
Medical screening examination/treatment/procedure(s) were performed by non-physician practitioner and as supervising physician I was immediately available for consultation/collaboration.   EKG Interpretation None        Raney Koeppen M Kearston Putman, MD 02/14/14 1049 

## 2014-02-24 ENCOUNTER — Ambulatory Visit (INDEPENDENT_AMBULATORY_CARE_PROVIDER_SITE_OTHER): Payer: Medicaid Other | Admitting: Obstetrics & Gynecology

## 2014-02-24 ENCOUNTER — Encounter: Payer: Self-pay | Admitting: Obstetrics & Gynecology

## 2014-02-24 VITALS — BP 103/58 | HR 71 | Temp 97.6°F | Ht 60.0 in | Wt 93.0 lb

## 2014-02-24 DIAGNOSIS — N949 Unspecified condition associated with female genital organs and menstrual cycle: Secondary | ICD-10-CM

## 2014-02-24 DIAGNOSIS — R102 Pelvic and perineal pain: Secondary | ICD-10-CM

## 2014-02-24 NOTE — Patient Instructions (Signed)
Constipation  Constipation is when a person has fewer than three bowel movements a week, has difficulty having a bowel movement, or has stools that are dry, hard, or larger than normal. As people grow older, constipation is more common. If you try to fix constipation with medicines that make you have a bowel movement (laxatives), the problem may get worse. Long-term laxative use may cause the muscles of the colon to become weak. A low-fiber diet, not taking in enough fluids, and taking certain medicines may make constipation worse.   CAUSES   · Certain medicines, such as antidepressants, pain medicine, iron supplements, antacids, and water pills.    · Certain diseases, such as diabetes, irritable bowel syndrome (IBS), thyroid disease, or depression.    · Not drinking enough water.    · Not eating enough fiber-rich foods.    · Stress or travel.    · Lack of physical activity or exercise.    · Ignoring the urge to have a bowel movement.    · Using laxatives too much.    SIGNS AND SYMPTOMS   · Having fewer than three bowel movements a week.    · Straining to have a bowel movement.    · Having stools that are hard, dry, or larger than normal.    · Feeling full or bloated.    · Pain in the lower abdomen.    · Not feeling relief after having a bowel movement.    DIAGNOSIS   Your health care provider will take a medical history and perform a physical exam. Further testing may be done for severe constipation. Some tests may include:  · A barium enema X-ray to examine your rectum, colon, and, sometimes, your small intestine.    · A sigmoidoscopy to examine your lower colon.    · A colonoscopy to examine your entire colon.  TREATMENT   Treatment will depend on the severity of your constipation and what is causing it. Some dietary treatments include drinking more fluids and eating more fiber-rich foods. Lifestyle treatments may include regular exercise. If these diet and lifestyle recommendations do not help, your health care  provider may recommend taking over-the-counter laxative medicines to help you have bowel movements. Prescription medicines may be prescribed if over-the-counter medicines do not work.   HOME CARE INSTRUCTIONS   · Eat foods that have a lot of fiber, such as fruits, vegetables, whole grains, and beans.  · Limit foods high in fat and processed sugars, such as french fries, hamburgers, cookies, candies, and soda.    · A fiber supplement may be added to your diet if you cannot get enough fiber from foods.    · Drink enough fluids to keep your urine clear or pale yellow.    · Exercise regularly or as directed by your health care provider.    · Go to the restroom when you have the urge to go. Do not hold it.    · Only take over-the-counter or prescription medicines as directed by your health care provider. Do not take other medicines for constipation without talking to your health care provider first.    SEEK IMMEDIATE MEDICAL CARE IF:   · You have bright red blood in your stool.    · Your constipation lasts for more than 4 days or gets worse.    · You have abdominal or rectal pain.    · You have thin, pencil-like stools.    · You have unexplained weight loss.  MAKE SURE YOU:   · Understand these instructions.  · Will watch your condition.  · Will get help right away if you are not   you have with your health care provider. Diet and Irritable Bowel Syndrome  No cure has been found for irritable bowel syndrome (IBS). Many options are available to treat the symptoms. Your caregiver will give you the best treatments available for your symptoms. He or she will also encourage you to manage stress and to make changes to your diet. You  need to work with your caregiver and Registered Dietician to find the best combination of medicine, diet, counseling, and support to control your symptoms. The following are some diet suggestions. FOODS THAT MAKE IBS WORSE  Fatty foods, such as [place] fries.  Milk products, such as cheese or ice cream.  Chocolate.  Alcohol.  Caffeine (found in coffee and some sodas).  Carbonated drinks, such as soda. If certain foods cause symptoms, you should eat less of them or stop eating them. FOOD JOURNAL   Keep a journal of the foods that seem to cause distress. Write down:  What you are eating during the day and when.  What problems you are having after eating.  When the symptoms occur in relation to your meals.  What foods always make you feel badly.  Take your notes with you to your caregiver to see if you should stop eating certain foods. FOODS THAT MAKE IBS BETTER Fiber reduces IBS symptoms, especially constipation, because it makes stools soft, bulky, and easier to pass. Fiber is found in bran, bread, cereal, beans, fruit, and vegetables. Examples of foods with fiber include:  Apples.  Peaches.  Pears.  Berries.  Figs.  Broccoli, raw.  Cabbage.  Carrots.  Raw peas.  Kidney beans.  Lima beans.  Whole-grain bread.  Whole-grain cereal. Add foods with fiber to your diet a little at a time. This will let your body get used to them. Too much fiber at once might cause gas and swelling of your abdomen. This can trigger symptoms in a person with IBS. Caregivers usually recommend a diet with enough fiber to produce soft, painless bowel movements. High fiber diets may cause gas and bloating. However, these symptoms often go away within a few weeks, as your body adjusts. In many cases, dietary fiber may lessen IBS symptoms, particularly constipation. However, it may not help pain or diarrhea. High fiber diets keep the colon mildly enlarged (distended) with the added fiber.  This may help prevent spasms in the colon. Some forms of fiber also keep water in the stool, thereby preventing hard stools that are difficult to pass.  Besides telling you to eat more foods with fiber, your caregiver may also tell you to get more fiber by taking a fiber pill or drinking water mixed with a special high fiber powder. An example of this is a natural fiber laxative containing psyllium seed.  TIPS  Large meals can cause cramping and diarrhea in people with IBS. If this happens to you, try eating 4 or 5 small meals a day, or try eating less at each of your usual 3 meals. It may also help if your meals are low in fat and high in carbohydrates. Examples of carbohydrates are pasta, rice, whole-grain breads and cereals, fruits, and vegetables.  If dairy products cause your symptoms to flare up, you can try eating less of those foods. You might be able to handle yogurt better than other dairy products, because it contains bacteria that helps with digestion. Dairy products are an important source of calcium and other nutrients. If you need to avoid dairy products, be sure to talk  with a Registered Dietitian about getting these nutrients through other food sources.  Drink enough water and fluids to keep your urine clear or pale yellow. This is important, especially if you have diarrhea. FOR MORE INFORMATION  International Foundation for Functional Gastrointestinal Disorders: www.iffgd.org  National Digestive Diseases Information Clearinghouse: digestive.StageSync.siniddk.nih.gov Document Released: 09/28/2003 Document Revised: 09/30/2011 Document Reviewed: 10/08/2013 [facility] Patient Information 2015 [place]. This information is not intended to replace advice given to you by your health care provider. Make sure you discuss any questions you have with your health care provider. Diet and Irritable Bowel Syndrome  No cure has been found for irritable bowel syndrome (IBS). Many options are available to  treat the symptoms. Your caregiver will give you the best treatments available for your symptoms. He or she will also encourage you to manage stress and to make changes to your diet. You need to work with your caregiver and Registered Dietician to find the best combination of medicine, diet, counseling, and support to control your symptoms. The following are some diet suggestions. FOODS THAT MAKE IBS WORSE  Fatty foods, such as [place] fries.  Milk products, such as cheese or ice cream.  Chocolate.  Alcohol.  Caffeine (found in coffee and some sodas).  Carbonated drinks, such as soda. If certain foods cause symptoms, you should eat less of them or stop eating them. FOOD JOURNAL   Keep a journal of the foods that seem to cause distress. Write down:  What you are eating during the day and when.  What problems you are having after eating.  When the symptoms occur in relation to your meals.  What foods always make you feel badly.  Take your notes with you to your caregiver to see if you should stop eating certain foods. FOODS THAT MAKE IBS BETTER Fiber reduces IBS symptoms, especially constipation, because it makes stools soft, bulky, and easier to pass. Fiber is found in bran, bread, cereal, beans, fruit, and vegetables. Examples of foods with fiber include:  Apples.  Peaches.  Pears.  Berries.  Figs.  Broccoli, raw.  Cabbage.  Carrots.  Raw peas.  Kidney beans.  Lima beans.  Whole-grain bread.  Whole-grain cereal. Add foods with fiber to your diet a little at a time. This will let your body get used to them. Too much fiber at once might cause gas and swelling of your abdomen. This can trigger symptoms in a person with IBS. Caregivers usually recommend a diet with enough fiber to produce soft, painless bowel movements. High fiber diets may cause gas and bloating. However, these symptoms often go away within a few weeks, as your body adjusts. In many cases, dietary  fiber may lessen IBS symptoms, particularly constipation. However, it may not help pain or diarrhea. High fiber diets keep the colon mildly enlarged (distended) with the added fiber. This may help prevent spasms in the colon. Some forms of fiber also keep water in the stool, thereby preventing hard stools that are difficult to pass.  Besides telling you to eat more foods with fiber, your caregiver may also tell you to get more fiber by taking a fiber pill or drinking water mixed with a special high fiber powder. An example of this is a natural fiber laxative containing psyllium seed.  TIPS  Large meals can cause cramping and diarrhea in people with IBS. If this happens to you, try eating 4 or 5 small meals a day, or try eating less at each of your usual 3  meals. It may also help if your meals are low in fat and high in carbohydrates. Examples of carbohydrates are pasta, rice, whole-grain breads and cereals, fruits, and vegetables.  If dairy products cause your symptoms to flare up, you can try eating less of those foods. You might be able to handle yogurt better than other dairy products, because it contains bacteria that helps with digestion. Dairy products are an important source of calcium and other nutrients. If you need to avoid dairy products, be sure to talk with a Registered Dietitian about getting these nutrients through other food sources.  Drink enough water and fluids to keep your urine clear or pale yellow. This is important, especially if you have diarrhea. FOR MORE INFORMATION  International Foundation for Functional Gastrointestinal Disorders: www.iffgd.org  National Digestive Diseases Information Clearinghouse: digestive.StageSync.si Document Released: 09/28/2003 Document Revised: 09/30/2011 Document Reviewed: 10/08/2013 [facility] Patient Information 2015 [place], [place]. This information is not intended to replace advice given to you by your health care provider. Make sure you discuss  any questions you have with your health care provider. Irritable Bowel Syndrome Irritable bowel syndrome (IBS) is caused by a disturbance of normal bowel function and is a common digestive disorder. You may also hear this condition called spastic colon, mucous colitis, and irritable colon. There is no cure for IBS. However, symptoms often gradually improve or disappear with a good diet, stress management, and medicine. This condition usually appears in late adolescence or early adulthood. Women develop it twice as often as men. CAUSES  After food has been digested and absorbed in the small intestine, waste material is moved into the large intestine, or colon. In the colon, water and salts are absorbed from the undigested products coming from the small intestine. The remaining residue, or fecal material, is held for elimination. Under normal circumstances, gentle, rhythmic contractions of the bowel walls push the fecal material along the colon toward the rectum. In IBS, however, these contractions are irregular and poorly coordinated. The fecal material is either retained too long, resulting in constipation, or expelled too soon, producing diarrhea. SIGNS AND SYMPTOMS  The most common symptom of IBS is abdominal pain. It is often in the lower left side of the abdomen, but it may occur anywhere in the abdomen. The pain comes from spasms of the bowel muscles happening too much and from the buildup of gas and fecal material in the colon. This pain:  Can range from sharp abdominal cramps to a dull, continuous ache.  Often worsens soon after eating.  Is often relieved by having a bowel movement or passing gas. Abdominal pain is usually accompanied by constipation, but it may also produce diarrhea. The diarrhea often occurs right after a meal or upon waking up in the morning. The stools are often soft, watery, and flecked with mucus. Other symptoms of IBS include:  Bloating.  Loss of  appetite.  Heartburn.  Backache.  Dull pain in the arms or shoulders.  Nausea.  Burping.  Vomiting.  Gas. IBS may also cause symptoms that are unrelated to the digestive system, such as:  Fatigue.  Headaches.  Anxiety.  Shortness of breath.  Trouble concentrating.  Dizziness. These symptoms tend to come and go. DIAGNOSIS  The symptoms of IBS may seem like symptoms of other, more serious digestive disorders. Your health care provider may want to perform tests to exclude these disorders.  TREATMENT Many medicines are available to help correct bowel function or relieve bowel spasms and abdominal pain. Among  the medicines available are:  Laxatives for severe constipation and to help restore normal bowel habits.  Specific antidiarrheal medicines to treat severe or lasting diarrhea.  Antispasmodic agents to relieve intestinal cramps. Your health care provider may also decide to treat you with a mild tranquilizer or sedative during unusually stressful periods in your life. Your health care provider may also prescribe antidepressant medicine. The use of this medicine has been shown to reduce pain and other symptoms of IBS. Remember that if any medicine is prescribed for you, you should take it exactly as directed. Make sure your health care provider knows how well it worked for you. HOME CARE INSTRUCTIONS   Take all medicines as directed by your health care provider.  Avoid foods that are high in fat or oils, such as heavy cream, butter, frankfurters, sausage, and other fatty meats.  Avoid foods that make you go to the bathroom, such as fruit, fruit juice, and dairy products.  Cut out carbonated drinks, chewing gum, and "gassy" foods such as beans and cabbage. This may help relieve bloating and burping.  Eat foods with bran, and drink plenty of liquids with the bran foods. This helps relieve constipation.  Keep track of what foods seem to bring on your symptoms.  Avoid  emotionally charged situations or circumstances that produce anxiety.  Start or continue exercising.  Get plenty of rest and sleep. Document Released: 07/08/2005 Document Revised: 07/13/2013 Document Reviewed: 02/26/2008 [facility] Patient Information 2015 [place], [place]. This information is not intended to replace advice given to you by your health care provider. Make sure you discuss any questions you have with your health care provider.

## 2014-02-25 LAB — WET PREP BY MOLECULAR PROBE
CANDIDA SPECIES: NEGATIVE
Gardnerella vaginalis: POSITIVE — AB
Trichomonas vaginosis: NEGATIVE

## 2014-02-25 NOTE — Progress Notes (Signed)
Patient ID: Leah [name], female   DOB: 1996-03-09, 18 y.o.   MRN: [id number]  Chief Complaint  Patient presents with  . Follow-up    PID    HPI Leah [name] is a 18 y.o. female.  C/O diffuse abdominal pain, constipation--chronic symptoms.  HPI  Past Medical History  Diagnosis Date  . Anemia   . Seizure     pseudoseizures  . Depression   . Anxiety     no meds during pregnancy    Past Surgical History  Procedure Laterality Date  . Wisdom tooth extraction      Family History  Problem Relation Age of Onset  . Arthritis Mother   . Bronchitis Mother   . Asthma Mother     Social History History  Substance Use Topics  . Smoking status: Never Smoker   . Smokeless tobacco: Never Used  . Alcohol Use: No    No Known Allergies  Current Outpatient Prescriptions  Medication Sig Dispense Refill  . acetaminophen (TYLENOL) 500 MG tablet Take 500 mg by mouth every 6 (six) hours as needed for mild pain.      Marland Kitchen. HYDROcodone-acetaminophen (NORCO/VICODIN) 5-325 MG per tablet Take 1-2 tablets by mouth every 6 (six) hours as needed for moderate pain or severe pain.  15 tablet  0  . medroxyPROGESTERone (DEPO-PROVERA) 150 MG/ML injection Inject 1 mL (150 mg total) into the muscle every 3 (three) months.  1 mL  5  . ondansetron (ZOFRAN) 4 MG tablet Take 1 tablet (4 mg total) by mouth every 6 (six) hours.  12 tablet  0   Current Facility-Administered Medications  Medication Dose Route Frequency Provider Last Rate Last Dose  . medroxyPROGESTERone (DEPO-PROVERA) injection 150 mg  150 mg Intramuscular Q90 days Amy Dessa PhiHowell Wren, CNM   150 mg at 12/07/13 0932    Review of Systems Review of Systems Constitutional: negative for fatigue and weight loss Respiratory: negative for cough and wheezing Cardiovascular: negative for chest pain, fatigue and palpitations Gastrointestinal: positive for abdominal pain, constipation Genitourinary:negative for abnormal vaginal discharge Integument/breast:  negative for nipple discharge Musculoskeletal:negative for myalgias Neurological: negative for gait problems and tremors Behavioral/Psych: negative for abusive relationship, depression Endocrine: negative for temperature intolerance     Blood pressure 103/58, pulse 71, temperature 97.6 F (36.4 C), height 5' (1.524 m), weight 42.185 kg (93 lb), last menstrual period 01/03/2014, not currently breastfeeding.  Physical Exam Physical Exam General:   alert  Skin:   no rash or abnormalities  Lungs:   clear to auscultation bilaterally  Heart:   regular rate and rhythm, S1, S2 normal, no murmur, click, rub or gallop  Abdomen:  normal findings: no organomegaly, soft, non-tender and no hernia  Pelvis:  External genitalia: normal general appearance Urinary system: urethral meatus normal and bladder without fullness, nontender Vaginal: normal without tenderness, induration or masses Cervix: normal appearance Adnexa: normal bimanual exam Uterus: anteverted and non-tender, normal size    .   Data Reviewed None  Assessment    S/P outpatient treatment for PID Chronic abdominal pain--?IBS, myofascial pain     Plan    Orders Placed This Encounter  Procedures  . WET PREP BY MOLECULAR PROBE    Follow up as needed.         JACKSON-MOORE,[name] 02/25/2014, 9:05 PM

## 2014-02-28 ENCOUNTER — Ambulatory Visit: Payer: Self-pay

## 2014-03-07 ENCOUNTER — Other Ambulatory Visit: Payer: Self-pay | Admitting: *Deleted

## 2014-03-07 DIAGNOSIS — N760 Acute vaginitis: Principal | ICD-10-CM

## 2014-03-07 DIAGNOSIS — B9689 Other specified bacterial agents as the cause of diseases classified elsewhere: Secondary | ICD-10-CM

## 2014-03-07 MED ORDER — METRONIDAZOLE 500 MG PO TABS: 500.0000 mg | ORAL_TABLET | Freq: Two times a day (BID) | ORAL | Status: DC

## 2014-03-08 ENCOUNTER — Emergency Department (HOSPITAL_COMMUNITY)
Admission: EM | Admit: 2014-03-08 | Discharge: 2014-03-08 | Disposition: A | Discharge status: Home / Self Care | Payer: Medicaid Other | Attending: Emergency Medicine | Admitting: Emergency Medicine

## 2014-03-08 ENCOUNTER — Encounter (HOSPITAL_COMMUNITY): Payer: Self-pay | Admitting: Emergency Medicine

## 2014-03-08 ENCOUNTER — Emergency Department (HOSPITAL_COMMUNITY): Payer: Medicaid Other

## 2014-03-08 DIAGNOSIS — Z8659 Personal history of other mental and behavioral disorders: Secondary | ICD-10-CM | POA: Diagnosis not present

## 2014-03-08 DIAGNOSIS — Z79899 Other long term (current) drug therapy: Secondary | ICD-10-CM | POA: Diagnosis not present

## 2014-03-08 DIAGNOSIS — Z3202 Encounter for pregnancy test, result negative: Secondary | ICD-10-CM | POA: Insufficient documentation

## 2014-03-08 DIAGNOSIS — Z862 Personal history of diseases of the blood and blood-forming organs and certain disorders involving the immune mechanism: Secondary | ICD-10-CM | POA: Diagnosis not present

## 2014-03-08 DIAGNOSIS — K8020 Calculus of gallbladder without cholecystitis without obstruction: Secondary | ICD-10-CM | POA: Insufficient documentation

## 2014-03-08 DIAGNOSIS — K8050 Calculus of bile duct without cholangitis or cholecystitis without obstruction: Secondary | ICD-10-CM

## 2014-03-08 LAB — CBC
HCT: 37 % (ref 36.0–46.0)
Hemoglobin: 12.5 g/dL (ref 12.0–15.0)
MCH: 28.9 pg (ref 26.0–34.0)
MCHC: 33.8 g/dL (ref 30.0–36.0)
MCV: 85.6 fL (ref 78.0–100.0)
Platelets: 222 10*3/uL (ref 150–400)
RBC: 4.32 MIL/uL (ref 3.87–5.11)
RDW: 12.8 % (ref 11.5–15.5)
WBC: 5.4 10*3/uL (ref 4.0–10.5)

## 2014-03-08 LAB — COMPREHENSIVE METABOLIC PANEL
ALT: 20 U/L (ref 0–35)
AST: 25 U/L (ref 0–37)
Albumin: 4.3 g/dL (ref 3.5–5.2)
Alkaline Phosphatase: 52 U/L (ref 39–117)
Anion gap: 13 (ref 5–15)
BUN: 12 mg/dL (ref 6–23)
CO2: 24 mEq/L (ref 19–32)
Calcium: 9.7 mg/dL (ref 8.4–10.5)
Chloride: 105 mEq/L (ref 96–112)
Creatinine, Ser: 0.63 mg/dL (ref 0.50–1.10)
GFR calc Af Amer: >90 mL/min (ref >90)
GFR calc non Af Amer: >90 mL/min (ref >90)
Glucose, Bld: 86 mg/dL (ref 70–99)
Potassium: 3.7 mEq/L (ref 3.7–5.3)
Sodium: 142 mEq/L (ref 137–147)
Total Bilirubin: 0.8 mg/dL (ref 0.3–1.2)
Total Protein: 7.7 g/dL (ref 6.0–8.3)

## 2014-03-08 LAB — POC URINE PREG, ED: Preg Test, Ur: NEGATIVE

## 2014-03-08 MED ORDER — ONDANSETRON HCL 4 MG/2ML IJ SOLN
4.0000 mg | Freq: Once | INTRAMUSCULAR | Status: AC
  Administered 2014-03-08: 4 mg via INTRAVENOUS
  Filled 2014-03-08: qty 2

## 2014-03-08 MED ORDER — HYDROCODONE-ACETAMINOPHEN 5-325 MG PO TABS: 1.0000 | ORAL_TABLET | ORAL | Status: DC | PRN

## 2014-03-08 MED ORDER — MORPHINE SULFATE 4 MG/ML IJ SOLN
4.0000 mg | Freq: Once | INTRAMUSCULAR | Status: AC
  Administered 2014-03-08: 4 mg via INTRAVENOUS
  Filled 2014-03-08: qty 1

## 2014-03-08 NOTE — ED Notes (Signed)
Pt states he has diagnosed gallstones. Pt c/o abd pain but no n/v.

## 2014-03-08 NOTE — ED Notes (Signed)
Pt aware that urine sample is needed.  Unable to void at this time.  

## 2014-03-08 NOTE — ED Notes (Signed)
Pt unable to void at this time. 

## 2014-03-08 NOTE — ED Provider Notes (Signed)
CSN: [id number]     Arrival date & time 03/08/14  1749 History   First MD Initiated Contact with Patient 03/08/14 1812     Chief Complaint  Patient presents with  . Cholelithiasis     (Consider location/radiation/quality/duration/timing/severity/associated sxs/prior Treatment) HPI Pt is a 18yo female dx with cholelithiasis with biliary colic about 1 month ago on 7/82 presenting to ED with c/o worsening RUQ pain with associated nausea over last few days.  Pt states pain is constant, waxing and waning, 9/10 at worse, mild to moderate improvement with norco pain medication.  Denies fever, vomiting or urinary or vaginal symptoms. States she did call [facility] and was told some paperwork needed to be faxed, however, states her PCP told her "we don't do that." pt states she does not know how to get in to see [facility] and is unsure if her PCP is able to help.  Denies hx of previous abdominal surgeries, sick contact or recent travel.    Past Medical History  Diagnosis Date  . Anemia   . Seizure     pseudoseizures  . Depression   . Anxiety     no meds during pregnancy   Past Surgical History  Procedure Laterality Date  . Wisdom tooth extraction     Family History  Problem Relation Age of Onset  . Arthritis Mother   . Bronchitis Mother   . Asthma Mother    History  Substance Use Topics  . Smoking status: Never Smoker   . Smokeless tobacco: Never Used  . Alcohol Use: No   OB History   Grav Para Term Preterm Abortions TAB SAB Ect Mult Living   1 1 1  0 0 0 0 0 0 1     Review of Systems  Constitutional: Positive for appetite change. Negative for fever, chills and fatigue.  Respiratory: Negative for cough and shortness of breath.   Cardiovascular: Negative for chest pain and palpitations.  Gastrointestinal: Positive for nausea and abdominal pain ( RUQ). Negative for vomiting, diarrhea and constipation.  Genitourinary: Negative for dysuria, urgency, frequency, hematuria,  flank pain, vaginal pain and pelvic pain.  Musculoskeletal: Negative for back pain and myalgias.  All other systems reviewed and are negative.     Allergies  Review of patient's allergies indicates no known allergies.  Home Medications   Prior to Admission medications   Medication Sig Start Date End Date Taking? Authorizing Provider  acetaminophen (TYLENOL) 500 MG tablet Take 1,000 mg by mouth every 6 (six) hours as needed for mild pain.    Yes Historical Provider, MD  HYDROcodone-acetaminophen (NORCO/VICODIN) 5-325 MG per tablet Take 1-2 tablets by mouth every 6 (six) hours as needed for moderate pain or severe pain. 02/09/14  Yes Antony Madura, PA-C  medroxyPROGESTERone (DEPO-PROVERA) 150 MG/ML injection Inject 1 mL (150 mg total) into the muscle every 3 (three) months. 11/23/13  Yes Amy Dessa Phi, CNM  metroNIDAZOLE (FLAGYL) 500 MG tablet Take 1 tablet (500 mg total) by mouth 2 (two) times daily. 03/07/14  Yes Antionette Char, MD  ondansetron (ZOFRAN) 4 MG tablet Take 1 tablet (4 mg total) by mouth every 6 (six) hours. 02/09/14  Yes Antony Madura, PA-C  HYDROcodone-acetaminophen (NORCO/VICODIN) 5-325 MG per tablet Take 1-2 tablets by mouth every 4 (four) hours as needed for moderate pain or severe pain. 03/08/14   Junius Finner, PA-C   BP 111/62  Pulse 69  Temp(Src) 98.2 F (36.8 C) (Oral)  Resp 14  SpO2 97% Physical Exam  Nursing note and vitals reviewed. Constitutional: She appears well-developed and well-nourished.  Thin female lying in exam bed, holding right upper abdomen. Appears uncomfortable.   HENT:  Head: Normocephalic and atraumatic.  Eyes: Conjunctivae are normal. No scleral icterus.  Neck: Normal range of motion.  Cardiovascular: Normal rate, regular rhythm and normal heart sounds.   Pulmonary/Chest: Effort normal and breath sounds normal. No respiratory distress. She has no wheezes. She has no rales. She exhibits no tenderness.  Abdominal: Soft. Bowel sounds are  normal. She exhibits no distension and no mass. There is tenderness (upper abdomen, worse in RUQ and epigastrium). There is no rebound, no guarding and no CVA tenderness.  Musculoskeletal: Normal range of motion.  Neurological: She is alert.  Skin: Skin is warm and dry.    ED Course  Procedures (including critical care time) Labs Review Labs Reviewed  CBC  COMPREHENSIVE METABOLIC PANEL  POC URINE PREG, ED    Imaging Review [place] Abdomen Complete  03/08/2014   CLINICAL DATA:  Chololithiasis.  Worsening pain.  EXAM: ULTRASOUND ABDOMEN COMPLETE  COMPARISON:  02/09/2014  FINDINGS: Gallbladder:  Scattered layering gallstones in the gallbladder, measuring up to 10 mm in single stone diameter. No gallbladder wall thickening or pericholecystic fluid. Sonographic Murphy's sign absent.  Common bile duct:  Diameter: 4 mm  Liver:  No focal lesion identified. Within normal limits in parenchymal echogenicity.  IVC:  No abnormality visualized.  Pancreas:  Visualized portion unremarkable.  Spleen:  Size and appearance within normal limits.  Right Kidney:  Length: 10.2. Echogenicity within normal limits. No mass or hydronephrosis visualized.  Left Kidney:  Length: 9.8. Echogenicity within normal limits. No mass or hydronephrosis visualized.  Abdominal aorta:  No aneurysm visualized.  Other findings:  None.  IMPRESSION: 1. Chololithiasis, with various layering tiny gallstones and one gallstone measuring up to 1 cm in diameter. No gallbladder wall thickening, pericholecystic fluid, or sonographic Murphy sign.   Electronically Signed   By: Herbie BaltimoreWalt  Liebkemann M.D.   On: 03/08/2014 20:00     EKG Interpretation None      MDM   Final diagnoses:  Biliary colic    Pt is a 18yo female presenting to ED with c/o RUQ pain, dx with cholelithiasis about 1 month ago. Pain worsening over last few days.  Pt was unable to f/u with [facility] as recommended during last ED visit.  Will recheck labs and U/S to ensure no cholecystitis.    Labs and imaging consistent with previous.  Pain and nausea were managed with morphine and zofran.  Pt looks well, non-toxic, afebrile. Strongly encouraged pt to f/u with [facility], also provided information to establish care [facility] if current PCP is not able to provide pt with resources and references needed for her biliary colic.  Return precautions provided. Pt verbalized understanding and agreement with tx plan.     Junius Finnerrin O'Malley, PA-C 03/08/14 2207

## 2014-03-08 NOTE — Discharge Instructions (Signed)
Biliary Colic  °Biliary colic is a steady or irregular pain in the upper abdomen. It is usually under the right side of the rib cage. It happens when gallstones interfere with the normal flow of bile from the gallbladder. Bile is a liquid that helps to digest fats. Bile is made in the liver and stored in the gallbladder. When you eat a meal, bile passes from the gallbladder through the cystic duct and the common bile duct into the small intestine. There, it mixes with partially digested food. If a gallstone blocks either of these ducts, the normal flow of bile is blocked. The muscle cells in the bile duct contract forcefully to try to move the stone. This causes the pain of biliary colic.  °SYMPTOMS  °· A person with biliary colic usually complains of pain in the upper abdomen. This pain can be: °¨ In the center of the upper abdomen just below the breastbone. °¨ In the upper-right part of the abdomen, near the gallbladder and liver. °¨ Spread back toward the right shoulder blade. °· Nausea and vomiting. °· The pain usually occurs after eating. °· Biliary colic is usually triggered by the digestive system's demand for bile. The demand for bile is high after fatty meals. Symptoms can also occur when a person who has been fasting suddenly eats a very large meal. Most episodes of biliary colic pass after 1 to 5 hours. After the most intense pain passes, your abdomen may continue to ache mildly for about 24 hours. °DIAGNOSIS  °After you describe your symptoms, your caregiver will perform a physical exam. He or she will pay attention to the upper right portion of your belly (abdomen). This is the area of your liver and gallbladder. An ultrasound will help your caregiver look for gallstones. Specialized scans of the gallbladder may also be done. Blood tests may be done, especially if you have fever or if your pain persists. °PREVENTION  °Biliary colic can be prevented by controlling the risk factors for gallstones. Some of  these risk factors, such as heredity, increasing age, and pregnancy are a normal part of life. Obesity and a high-fat diet are risk factors you can change through a healthy lifestyle. Women going through menopause who take hormone replacement therapy (estrogen) are also more likely to develop biliary colic. °TREATMENT  °· Pain medication may be prescribed. °· You may be encouraged to eat a fat-free diet. °· If the first episode of biliary colic is severe, or episodes of colic keep retuning, surgery to remove the gallbladder (cholecystectomy) is usually recommended. This procedure can be done through small incisions using an instrument called a laparoscope. The procedure often requires a brief stay in the hospital. Some people can leave the hospital the same day. It is the most widely used treatment in people troubled by painful gallstones. It is effective and safe, with no complications in more than 90% of cases. °· If surgery cannot be done, medication that dissolves gallstones may be used. This medication is expensive and can take months or years to work. Only small stones will dissolve. °· Rarely, medication to dissolve gallstones is combined with a procedure called shock-wave lithotripsy. This procedure uses carefully aimed shock waves to break up gallstones. In many people treated with this procedure, gallstones form again within a few years. °PROGNOSIS  °If gallstones block your cystic duct or common bile duct, you are at risk for repeated episodes of biliary colic. There is also a 25% chance that you will develop   a gallbladder infection(acute cholecystitis), or some other complication of gallstones within 10 to 20 years. If you have surgery, schedule it at a time that is convenient for you and at a time when you are not sick. °HOME CARE INSTRUCTIONS  °· Drink plenty of clear fluids. °· Avoid fatty, greasy or fried foods, or any foods that make your pain worse. °· Take medications as directed. °SEEK MEDICAL  CARE IF:  °· You develop a fever over 100.5° F (38.1° C). °· Your pain gets worse over time. °· You develop nausea that prevents you from eating and drinking. °· You develop vomiting. °SEEK IMMEDIATE MEDICAL CARE IF:  °· You have continuous or severe belly (abdominal) pain which is not relieved with medications. °· You develop nausea and vomiting which is not relieved with medications. °· You have symptoms of biliary colic and you suddenly develop a fever and shaking chills. This may signal cholecystitis. Call your caregiver immediately. °· You develop a yellow color to your skin or the white part of your eyes (jaundice). °Document Released: 12/09/2005 Document Revised: 09/30/2011 Document Reviewed: 02/18/2008 °ExitCare® Patient Information ©2015 [employer]. This information is not intended to replace advice given to you by your health care provider. Make sure you discuss any questions you have with your health care provider. ° °

## 2014-03-09 NOTE — ED Provider Notes (Signed)
Medical screening examination/treatment/procedure(s) were performed by non-physician practitioner and as supervising physician I was immediately available for consultation/collaboration.   EKG Interpretation None        Nasario Czerniak, MD 03/09/14 0002 

## 2014-03-10 ENCOUNTER — Encounter: Payer: Self-pay | Admitting: Family Medicine

## 2014-03-10 ENCOUNTER — Ambulatory Visit: Payer: Medicaid Other | Attending: Family Medicine | Admitting: Family Medicine

## 2014-03-10 VITALS — BP 110/81 | HR 84 | Temp 98.0°F | Resp 16 | Ht 60.0 in | Wt 89.0 lb

## 2014-03-10 DIAGNOSIS — B9689 Other specified bacterial agents as the cause of diseases classified elsewhere: Secondary | ICD-10-CM | POA: Insufficient documentation

## 2014-03-10 DIAGNOSIS — A499 Bacterial infection, unspecified: Secondary | ICD-10-CM | POA: Insufficient documentation

## 2014-03-10 DIAGNOSIS — Z309 Encounter for contraceptive management, unspecified: Secondary | ICD-10-CM | POA: Insufficient documentation

## 2014-03-10 DIAGNOSIS — R110 Nausea: Secondary | ICD-10-CM | POA: Diagnosis not present

## 2014-03-10 DIAGNOSIS — R109 Unspecified abdominal pain: Secondary | ICD-10-CM | POA: Diagnosis not present

## 2014-03-10 DIAGNOSIS — Z3040 Encounter for surveillance of contraceptives, unspecified: Secondary | ICD-10-CM

## 2014-03-10 DIAGNOSIS — N760 Acute vaginitis: Secondary | ICD-10-CM | POA: Diagnosis not present

## 2014-03-10 DIAGNOSIS — R634 Abnormal weight loss: Secondary | ICD-10-CM | POA: Insufficient documentation

## 2014-03-10 DIAGNOSIS — Z3049 Encounter for surveillance of other contraceptives: Secondary | ICD-10-CM | POA: Insufficient documentation

## 2014-03-10 DIAGNOSIS — IMO0001 Reserved for inherently not codable concepts without codable children: Secondary | ICD-10-CM | POA: Insufficient documentation

## 2014-03-10 DIAGNOSIS — K8020 Calculus of gallbladder without cholecystitis without obstruction: Secondary | ICD-10-CM

## 2014-03-10 DIAGNOSIS — K8050 Calculus of bile duct without cholangitis or cholecystitis without obstruction: Secondary | ICD-10-CM | POA: Insufficient documentation

## 2014-03-10 LAB — POCT URINE PREGNANCY: Preg Test, Ur: NEGATIVE

## 2014-03-10 MED ORDER — FLUCONAZOLE 150 MG PO TABS: 150.0000 mg | ORAL_TABLET | ORAL | Status: DC

## 2014-03-10 MED ORDER — MEDROXYPROGESTERONE ACETATE 150 MG/ML IM SUSP: 150.0000 mg | INTRAMUSCULAR | Status: DC

## 2014-03-10 NOTE — Progress Notes (Signed)
Pt is here to establish care. Pt is here wanting to have surgery for the gallstones. Pt reports loosing weight and having body aches.

## 2014-03-10 NOTE — Assessment & Plan Note (Signed)
U preg negative. Depo given today.

## 2014-03-10 NOTE — Assessment & Plan Note (Signed)
Please finish flagyl for BV. Take diflucan to prevent a yeast infection.

## 2014-03-10 NOTE — Progress Notes (Signed)
   Subjective:    Patient ID: Leah [name], female    DOB: 03-15-1996, 18 y.o.   MRN: [id number] CC: stomach pain  HPI 18 year old female presents with her mother to establish care and discuss the following:  #1 abdominal pain: Patient with daily abdominal pain since July of 2015. She's been diagnosed with gallstones and biliary colic. She takes Vicodin at night as needed for pain. She admits to nausea and some weight loss. She denies fever, chills, vomiting, dysuria, pain in back and flanks..   #2 contraception: Patient has one child she is on Depo-Provera for birth control. She does not have menstrual periods. She likes Depo-Provera to continue it. She was one week over due for her shot. She's not currently 6 active.   #3 recent STD: She was diagnosed with Chlamydia one month ago and was treated. She had a recurrence of vaginal discharge  was diagnosed with bacterial vaginosis 2 weeks ago and is currently taking Flagyl.  Social history: Nonsmoker  Review of Systems As per history of present illness including weight loss, and bright red blood on tissue when she wiped.     Objective:   Physical Exam BP 110/81  Pulse 84  Temp(Src) 98 F (36.7 C) (Oral)  Resp 16  Ht 5' (1.524 m)  Wt 89 lb (40.37 kg)  BMI 17.38 kg/m2  SpO2 100% Wt Readings from Last 3 Encounters:  03/10/14 89 lb (40.37 kg) (0%*, Z = -2.95)  02/24/14 93 lb (42.185 kg) (1%*, Z = -2.48)  02/08/14 84 lb (38.102 kg) (0%*, Z = -3.61)   * Growth percentiles are based on CDC 2-20 Years data.  General appearance: thin,  alert, cooperative and no distress Neck: no adenopathy, supple, symmetrical, trachea midline and thyroid not enlarged, symmetric, no tenderness/mass/nodules Lungs: clear to auscultation bilaterally Heart: regular rate and rhythm, S1, S2 normal, no murmur, click, rub or gallop Abdomen: flat, soft, mild TTP upper abdomen above a umbilicus without rebound or guarding. Extremities: normal   U preg:  Negative.     Assessment & Plan:

## 2014-03-10 NOTE — Patient Instructions (Signed)
Leah [name],  Thank you for coming in today. It was a pleasure meeting you. I look forward to being your doctor.   Please finish flagyl for BV. Take diflucan to prevent a yeast infection.  I have placed a referral to general surgery, our office will assist you with scheduling the appt. In the meantime see info below regarding biliary colic and gallstones and things to avoid.  Call if symptoms worsen.  Dr. Armen Pickup   Biliary Colic  Biliary colic is a steady or irregular pain in the upper abdomen. It is usually under the right side of the rib cage. It happens when gallstones interfere with the normal flow of bile from the gallbladder. Bile is a liquid that helps to digest fats. Bile is made in the liver and stored in the gallbladder. When you eat a meal, bile passes from the gallbladder through the cystic duct and the common bile duct into the small intestine. There, it mixes with partially digested food. If a gallstone blocks either of these ducts, the normal flow of bile is blocked. The muscle cells in the bile duct contract forcefully to try to move the stone. This causes the pain of biliary colic.  SYMPTOMS   A person with biliary colic usually complains of pain in the upper abdomen. This pain can be:  In the center of the upper abdomen just below the breastbone.  In the upper-right part of the abdomen, near the gallbladder and liver.  Spread back toward the right shoulder blade.  Nausea and vomiting.  The pain usually occurs after eating.  Biliary colic is usually triggered by the digestive system's demand for bile. The demand for bile is high after fatty meals. Symptoms can also occur when a person who has been fasting suddenly eats a very large meal. Most episodes of biliary colic pass after 1 to 5 hours. After the most intense pain passes, your abdomen may continue to ache mildly for about 24 hours. DIAGNOSIS  After you describe your symptoms, your caregiver will perform a  physical exam. He or she will pay attention to the upper right portion of your belly (abdomen). This is the area of your liver and gallbladder. An ultrasound will help your caregiver look for gallstones. Specialized scans of the gallbladder may also be done. Blood tests may be done, especially if you have fever or if your pain persists. PREVENTION  Biliary colic can be prevented by controlling the risk factors for gallstones. Some of these risk factors, such as heredity, increasing age, and pregnancy are a normal part of life. Obesity and a high-fat diet are risk factors you can change through a healthy lifestyle. Women going through menopause who take hormone replacement therapy (estrogen) are also more likely to develop biliary colic. TREATMENT   Pain medication may be prescribed.  You may be encouraged to eat a fat-free diet.  If the first episode of biliary colic is severe, or episodes of colic keep retuning, surgery to remove the gallbladder (cholecystectomy) is usually recommended. This procedure can be done through small incisions using an instrument called a laparoscope. The procedure often requires a brief stay in the hospital. Some people can leave the hospital the same day. It is the most widely used treatment in people troubled by painful gallstones. It is effective and safe, with no complications in more than 90% of cases.  If surgery cannot be done, medication that dissolves gallstones may be used. This medication is expensive and can take months or  years to work. Only small stones will dissolve.  Rarely, medication to dissolve gallstones is combined with a procedure called shock-wave lithotripsy. This procedure uses carefully aimed shock waves to break up gallstones. In many people treated with this procedure, gallstones form again within a few years. PROGNOSIS  If gallstones block your cystic duct or common bile duct, you are at risk for repeated episodes of biliary colic. There is  also a 25% chance that you will develop a gallbladder infection(acute cholecystitis), or some other complication of gallstones within 10 to 20 years. If you have surgery, schedule it at a time that is convenient for you and at a time when you are not sick. HOME CARE INSTRUCTIONS   Drink plenty of clear fluids.  Avoid fatty, greasy or fried foods, or any foods that make your pain worse.  Take medications as directed. SEEK MEDICAL CARE IF:   You develop a fever over 100.5 F (38.1 C).  Your pain gets worse over time.  You develop nausea that prevents you from eating and drinking.  You develop vomiting. SEEK IMMEDIATE MEDICAL CARE IF:   You have continuous or severe belly (abdominal) pain which is not relieved with medications.  You develop nausea and vomiting which is not relieved with medications.  You have symptoms of biliary colic and you suddenly develop a fever and shaking chills. This may signal cholecystitis. Call your caregiver immediately.  You develop a yellow color to your skin or the white part of your eyes (jaundice). Document Released: 12/09/2005 Document Revised: 09/30/2011 Document Reviewed: 02/18/2008 [facility] Patient Information 2015 [place]. This information is not intended to replace advice given to you by your health care provider. Make sure you discuss any questions you have with your health care provider.

## 2014-03-12 ENCOUNTER — Encounter (HOSPITAL_COMMUNITY): Payer: Self-pay | Admitting: Emergency Medicine

## 2014-03-12 ENCOUNTER — Emergency Department (HOSPITAL_COMMUNITY): Payer: Medicaid Other

## 2014-03-12 ENCOUNTER — Emergency Department (HOSPITAL_COMMUNITY)
Admission: EM | Admit: 2014-03-12 | Discharge: 2014-03-12 | Disposition: A | Discharge status: Home / Self Care | Payer: Medicaid Other | Attending: Emergency Medicine | Admitting: Emergency Medicine

## 2014-03-12 DIAGNOSIS — K5900 Constipation, unspecified: Secondary | ICD-10-CM | POA: Diagnosis not present

## 2014-03-12 DIAGNOSIS — Z8659 Personal history of other mental and behavioral disorders: Secondary | ICD-10-CM | POA: Insufficient documentation

## 2014-03-12 DIAGNOSIS — Z3202 Encounter for pregnancy test, result negative: Secondary | ICD-10-CM | POA: Diagnosis not present

## 2014-03-12 DIAGNOSIS — Z8669 Personal history of other diseases of the nervous system and sense organs: Secondary | ICD-10-CM | POA: Diagnosis not present

## 2014-03-12 DIAGNOSIS — Z79899 Other long term (current) drug therapy: Secondary | ICD-10-CM | POA: Diagnosis not present

## 2014-03-12 DIAGNOSIS — R110 Nausea: Secondary | ICD-10-CM | POA: Diagnosis not present

## 2014-03-12 DIAGNOSIS — R1084 Generalized abdominal pain: Secondary | ICD-10-CM | POA: Diagnosis not present

## 2014-03-12 DIAGNOSIS — Z792 Long term (current) use of antibiotics: Secondary | ICD-10-CM | POA: Insufficient documentation

## 2014-03-12 DIAGNOSIS — Z862 Personal history of diseases of the blood and blood-forming organs and certain disorders involving the immune mechanism: Secondary | ICD-10-CM | POA: Insufficient documentation

## 2014-03-12 LAB — COMPREHENSIVE METABOLIC PANEL
ALK PHOS: 52 U/L (ref 39–117)
ALT: 15 U/L (ref 0–35)
AST: 26 U/L (ref 0–37)
Albumin: 4.3 g/dL (ref 3.5–5.2)
Anion gap: 12 (ref 5–15)
BUN: 9 mg/dL (ref 6–23)
CALCIUM: 9.7 mg/dL (ref 8.4–10.5)
CO2: 23 meq/L (ref 19–32)
Chloride: 104 mEq/L (ref 96–112)
Creatinine, Ser: 0.66 mg/dL (ref 0.50–1.10)
GLUCOSE: 88 mg/dL (ref 70–99)
Potassium: 4.7 mEq/L (ref 3.7–5.3)
Sodium: 139 mEq/L (ref 137–147)
Total Bilirubin: 0.6 mg/dL (ref 0.3–1.2)
Total Protein: 7.7 g/dL (ref 6.0–8.3)

## 2014-03-12 LAB — URINALYSIS, ROUTINE W REFLEX MICROSCOPIC
BILIRUBIN URINE: NEGATIVE
Glucose, UA: NEGATIVE mg/dL
HGB URINE DIPSTICK: NEGATIVE
Ketones, ur: NEGATIVE mg/dL
Nitrite: NEGATIVE
PROTEIN: NEGATIVE mg/dL
Specific Gravity, Urine: 1.017 (ref 1.005–1.030)
UROBILINOGEN UA: 0.2 mg/dL (ref 0.0–1.0)
pH: 7.5 (ref 5.0–8.0)

## 2014-03-12 LAB — CBC WITH DIFFERENTIAL/PLATELET
Basophils Absolute: 0 10*3/uL (ref 0.0–0.1)
Basophils Relative: 0 % (ref 0–1)
Eosinophils Absolute: 0 10*3/uL (ref 0.0–0.7)
Eosinophils Relative: 1 % (ref 0–5)
HCT: 35.7 % — ABNORMAL LOW (ref 36.0–46.0)
Hemoglobin: 12.3 g/dL (ref 12.0–15.0)
Lymphocytes Relative: 31 % (ref 12–46)
Lymphs Abs: 1.8 10*3/uL (ref 0.7–4.0)
MCH: 29.6 pg (ref 26.0–34.0)
MCHC: 34.5 g/dL (ref 30.0–36.0)
MCV: 85.8 fL (ref 78.0–100.0)
Monocytes Absolute: 0.3 10*3/uL (ref 0.1–1.0)
Monocytes Relative: 6 % (ref 3–12)
Neutro Abs: 3.6 10*3/uL (ref 1.7–7.7)
Neutrophils Relative %: 62 % (ref 43–77)
Platelets: 225 10*3/uL (ref 150–400)
RBC: 4.16 MIL/uL (ref 3.87–5.11)
RDW: 12.9 % (ref 11.5–15.5)
WBC: 5.8 10*3/uL (ref 4.0–10.5)

## 2014-03-12 LAB — URINE MICROSCOPIC-ADD ON

## 2014-03-12 LAB — PREGNANCY, URINE: Preg Test, Ur: NEGATIVE

## 2014-03-12 MED ORDER — DICYCLOMINE HCL 20 MG PO TABS: 20.0000 mg | ORAL_TABLET | Freq: Two times a day (BID) | ORAL | Status: DC | PRN

## 2014-03-12 MED ORDER — POLYETHYLENE GLYCOL 3350 17 G PO PACK: 17.0000 g | PACK | Freq: Every day | ORAL | Status: DC

## 2014-03-12 NOTE — ED Notes (Signed)
Patient transported to X-ray 

## 2014-03-12 NOTE — ED Notes (Signed)
Pt c/o upper abd pain, pt dx with gallstones in July, states pain and nausea medication not effective.

## 2014-03-12 NOTE — Discharge Instructions (Signed)

## 2014-03-12 NOTE — ED Provider Notes (Signed)
CSN: [id number]     Arrival date & time 03/12/14  0007 History   First MD Initiated Contact with Patient 03/12/14 0136     Chief Complaint  Patient presents with  . Cholelithiasis     (Consider location/radiation/quality/duration/timing/severity/associated sxs/prior Treatment) HPI Patient has a history of cholelithiasis. She states for the past few months she has had episodic upper abdominal pain after she eats. Over the last 3 weeks patient has had generalized abdominal discomfort that is constant. It is not associated with food. She has nausea but denies any vomiting. She's had no fever or chills. She is yet to be evaluated by a Careers advisersurgeon.   Past Medical History  Diagnosis Date  . Anemia 2013  . Anxiety 2011    no meds during pregnancy  . Depression 2011  . 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 Depression 03/16/2013    History of Depression. Review PP. Have patient RTC in 2 weeks    Past Surgical History  Procedure Laterality Date  . Wisdom tooth extraction  2013   Family History  Problem Relation Age of Onset  . Arthritis Mother   . Bronchitis Mother   . Asthma Mother   . Hearing loss Paternal Grandfather   . Cancer Neg Hx   . Diabetes Neg Hx    History  Substance Use Topics  . Smoking status: Never Smoker   . Smokeless tobacco: Never Used  . Alcohol Use: No   OB History   Grav Para Term Preterm Abortions TAB SAB Ect Mult Living   1 1 1  0 0 0 0 0 0 1     Review of Systems  Constitutional: Negative for fever and chills.  Respiratory: Negative for cough and shortness of breath.   Cardiovascular: Negative for chest pain.  Gastrointestinal: Positive for nausea, abdominal pain and constipation. Negative for vomiting, diarrhea and blood in stool.  Genitourinary: Negative for dysuria, frequency and flank pain.  Musculoskeletal: Negative for back pain, myalgias, neck pain and neck stiffness.  Skin: Negative for rash and wound.  Neurological:  Negative for dizziness, weakness, light-headedness, numbness and headaches.  All other systems reviewed and are negative.     Allergies  Review of patient's allergies indicates no known allergies.  Home Medications   Prior to Admission medications   Medication Sig Start Date End Date Taking? Authorizing Provider  acetaminophen (TYLENOL) 500 MG tablet Take 1,000 mg by mouth every 6 (six) hours as needed for mild pain.    Yes Historical Provider, MD  HYDROcodone-acetaminophen (NORCO/VICODIN) 5-325 MG per tablet Take 1-2 tablets by mouth every 6 (six) hours as needed for moderate pain or severe pain. 02/09/14  Yes Antony MaduraKelly Humes, PA-C  medroxyPROGESTERone (DEPO-PROVERA) 150 MG/ML injection Inject 1 mL (150 mg total) into the muscle every 3 (three) months. 03/10/14  Yes Josalyn C Funches, MD  metroNIDAZOLE (FLAGYL) 500 MG tablet Take 1 tablet (500 mg total) by mouth 2 (two) times daily. 03/07/14  Yes Antionette CharLisa Jackson-Moore, MD  ondansetron (ZOFRAN) 4 MG tablet Take 1 tablet (4 mg total) by mouth every 6 (six) hours. 02/09/14  Yes Antony MaduraKelly Humes, PA-C  dicyclomine (BENTYL) 20 MG tablet Take 1 tablet (20 mg total) by mouth 2 (two) times daily as needed for spasms. 03/12/14   Loren Raceravid Canyon Lohr, MD  fluconazole (DIFLUCAN) 150 MG tablet Take 1 tablet (150 mg total) by mouth every 3 (three) days. 03/10/14   Lora PaulaJosalyn C Funches, MD  polyethylene glycol (MIRALAX / GLYCOLAX)  packet Take 17 g by mouth daily. 03/12/14   Loren Racer, MD   BP 104/54  Pulse 65  Temp(Src) 98.8 F (37.1 C) (Oral)  Resp 18  Ht 5' (1.524 m)  Wt 86 lb (39.009 kg)  BMI 16.80 kg/m2  SpO2 98% Physical Exam  Nursing note and vitals reviewed. Constitutional: She is oriented to person, place, and time. She appears well-developed and well-nourished. No distress.  HENT:  Head: Normocephalic and atraumatic.  Mouth/Throat: Oropharynx is clear and moist.  Eyes: EOM are normal. Pupils are equal, round, and reactive to light.  Neck: Normal range  of motion. Neck supple.  Cardiovascular: Normal rate and regular rhythm.   Pulmonary/Chest: Effort normal and breath sounds normal. No respiratory distress. She has no wheezes. She has no rales.  Abdominal: Soft. Bowel sounds are normal. She exhibits distension. She exhibits no mass. There is tenderness (diffuse tenderness throughout. She appears to be more tender on the left side of her abdomen.). There is no rebound and no guarding.  Musculoskeletal: Normal range of motion. She exhibits no edema and no tenderness.  No CVA tenderness.  Neurological: She is alert and oriented to person, place, and time.  Skin: Skin is warm and dry. No rash noted. No erythema.  Psychiatric: She has a normal mood and affect. Her behavior is normal.    ED Course  Procedures (including critical care time) Labs Review Labs Reviewed  CBC WITH DIFFERENTIAL - Abnormal; Notable for the following:    HCT 35.7 (*)    All other components within normal limits  URINALYSIS, ROUTINE W REFLEX MICROSCOPIC - Abnormal; Notable for the following:    Leukocytes, UA TRACE (*)    All other components within normal limits  URINE MICROSCOPIC-ADD ON - Abnormal; Notable for the following:    Squamous Epithelial / LPF FEW (*)    All other components within normal limits  COMPREHENSIVE METABOLIC PANEL  PREGNANCY, URINE    Imaging Review Dg Abd Acute W/chest  03/12/2014   CLINICAL DATA:  Abdominal pain  EXAM: ACUTE ABDOMEN SERIES (ABDOMEN 2 VIEW & CHEST 1 VIEW)  COMPARISON:  12/01/2013 chest x-ray  FINDINGS: There is no evidence of dilated bowel loops or free intraperitoneal air. No radiopaque calculi or other significant radiographic abnormality is seen. Heart size and mediastinal contours are within normal limits. Both lungs are clear.  IMPRESSION: Negative abdominal radiographs.  No acute cardiopulmonary disease.   Electronically Signed   By: Tiburcio Pea M.D.   On: 03/12/2014 02:48     EKG Interpretation None      MDM    Final diagnoses:  Generalized abdominal pain   Patient has history of cholelithiasis. I do not believe that her abdominal pain is due to to this. This pain has been constant for several weeks. It is not associated with food. She has nausea but no vomiting. She has had constipation in the past. She's had no fever or chills. Abdominal pain is diffuse. Laboratory workup is normal. She has no elevation in her LFTs or white blood cell count. I do not believe that repeat ultrasound imaging is necessary this point. She did have plain radiographs without any obstructive findings. Patient does have scattered stool throughout the colon. I think likely her pain is due more to colonic spasm than biliary colic. We'll treat as constipation and give Bentyl. Patient is in agreement with plan. She is following up with surgery. She's been given clear return caution status post understanding. She is asking  for food in the emergency department and is tolerating without difficulty. Her abdominal exam is benign. She has no focal tenderness, rebound or guarding.    Loren Racer, MD 03/12/14 [phone]

## 2014-03-12 NOTE — ED Notes (Signed)
MD at bedside. 

## 2014-03-15 ENCOUNTER — Telehealth: Payer: Self-pay | Admitting: *Deleted

## 2014-03-15 NOTE — Telephone Encounter (Signed)
Patient has been to the hospital a couple times for her gall stones- she is ready to see a surgeon- can we do her referral as we are listed as her primary. Told patient we would let Dr Tamela Oddi know.

## 2014-03-23 NOTE — Telephone Encounter (Signed)
Please have front office make referral

## 2014-03-24 ENCOUNTER — Ambulatory Visit: Payer: Self-pay | Admitting: Family Medicine

## 2014-03-30 ENCOUNTER — Other Ambulatory Visit: Payer: Self-pay | Admitting: *Deleted

## 2014-03-30 DIAGNOSIS — K8020 Calculus of gallbladder without cholecystitis without obstruction: Secondary | ICD-10-CM

## 2014-03-30 NOTE — Telephone Encounter (Signed)
Referral to general surgery put in- note forwarded to [facility]

## 2014-03-31 ENCOUNTER — Telehealth: Payer: Self-pay

## 2014-03-31 NOTE — Telephone Encounter (Signed)
Patient has appt with [facility] [place] [facility] 04/14/14 @ 10 am with Dr. Derrell Lolling They are no longer on [facility], but schedule is in referral under scheduling.

## 2014-04-24 ENCOUNTER — Ambulatory Visit (HOSPITAL_COMMUNITY)
Admission: EM | Admit: 2014-04-24 | Discharge: 2014-04-24 | Disposition: A | Discharge status: Home / Self Care | Payer: Medicaid Other | Attending: Otolaryngology | Admitting: Otolaryngology

## 2014-04-24 ENCOUNTER — Encounter (HOSPITAL_COMMUNITY): Payer: Self-pay | Admitting: Emergency Medicine

## 2014-04-24 ENCOUNTER — Emergency Department (HOSPITAL_COMMUNITY): Payer: Medicaid Other | Admitting: Anesthesiology

## 2014-04-24 ENCOUNTER — Encounter (HOSPITAL_COMMUNITY): Payer: Medicaid Other | Admitting: Anesthesiology

## 2014-04-24 ENCOUNTER — Emergency Department (HOSPITAL_COMMUNITY): Payer: Medicaid Other

## 2014-04-24 ENCOUNTER — Encounter (HOSPITAL_COMMUNITY)
Admission: EM | Disposition: A | Discharge status: Home / Self Care | Payer: Self-pay | Source: Home / Self Care | Attending: Emergency Medicine

## 2014-04-24 DIAGNOSIS — F329 Major depressive disorder, single episode, unspecified: Secondary | ICD-10-CM | POA: Insufficient documentation

## 2014-04-24 DIAGNOSIS — R05 Cough: Secondary | ICD-10-CM | POA: Diagnosis not present

## 2014-04-24 DIAGNOSIS — R070 Pain in throat: Secondary | ICD-10-CM | POA: Insufficient documentation

## 2014-04-24 DIAGNOSIS — T17208A Unspecified foreign body in pharynx causing other injury, initial encounter: Secondary | ICD-10-CM | POA: Diagnosis present

## 2014-04-24 DIAGNOSIS — M795 Residual foreign body in soft tissue: Secondary | ICD-10-CM

## 2014-04-24 DIAGNOSIS — Z79899 Other long term (current) drug therapy: Secondary | ICD-10-CM | POA: Insufficient documentation

## 2014-04-24 DIAGNOSIS — F419 Anxiety disorder, unspecified: Secondary | ICD-10-CM | POA: Diagnosis not present

## 2014-04-24 HISTORY — PX: DIRECT LARYNGOSCOPY: SHX5326

## 2014-04-24 HISTORY — PX: FOREIGN BODY REMOVAL ESOPHAGEAL: SHX5322

## 2014-04-24 SURGERY — LARYNGOSCOPY, DIRECT
Anesthesia: General | Site: Mouth

## 2014-04-24 MED ORDER — DEXAMETHASONE SODIUM PHOSPHATE 4 MG/ML IJ SOLN
INTRAMUSCULAR | Status: AC
  Filled 2014-04-24: qty 1

## 2014-04-24 MED ORDER — PROPOFOL 10 MG/ML IV BOLUS
INTRAVENOUS | Status: DC | PRN
  Administered 2014-04-24: 160 mg via INTRAVENOUS

## 2014-04-24 MED ORDER — ONDANSETRON HCL 4 MG/2ML IJ SOLN
INTRAMUSCULAR | Status: AC
  Filled 2014-04-24: qty 2

## 2014-04-24 MED ORDER — PROPOFOL 10 MG/ML IV BOLUS
INTRAVENOUS | Status: AC
  Filled 2014-04-24: qty 20

## 2014-04-24 MED ORDER — HYDROMORPHONE HCL 1 MG/ML IJ SOLN
0.2500 mg | INTRAMUSCULAR | Status: DC | PRN
Start: 2014-04-24 — End: 2014-04-24

## 2014-04-24 MED ORDER — DEXAMETHASONE SODIUM PHOSPHATE 10 MG/ML IJ SOLN
10.0000 mg | Freq: Once | INTRAMUSCULAR | Status: AC
  Administered 2014-04-24: 10 mg via INTRAVENOUS
  Filled 2014-04-24: qty 1

## 2014-04-24 MED ORDER — SUCCINYLCHOLINE CHLORIDE 20 MG/ML IJ SOLN
INTRAMUSCULAR | Status: DC | PRN
  Administered 2014-04-24: 100 mg via INTRAVENOUS

## 2014-04-24 MED ORDER — FAMOTIDINE IN NACL 20-0.9 MG/50ML-% IV SOLN
20.0000 mg | Freq: Once | INTRAVENOUS | Status: AC
  Administered 2014-04-24: 20 mg via INTRAVENOUS
  Filled 2014-04-24: qty 50

## 2014-04-24 MED ORDER — DIPHENHYDRAMINE HCL 50 MG/ML IJ SOLN
25.0000 mg | Freq: Once | INTRAMUSCULAR | Status: AC
  Administered 2014-04-24: 25 mg via INTRAVENOUS
  Filled 2014-04-24: qty 1

## 2014-04-24 MED ORDER — METOCLOPRAMIDE HCL 5 MG/ML IJ SOLN: 10.0000 mg | Freq: Once | INTRAMUSCULAR | Status: DC | PRN

## 2014-04-24 MED ORDER — SODIUM CHLORIDE 0.9 % IV BOLUS (SEPSIS)
1000.0000 mL | Freq: Once | INTRAVENOUS | Status: AC
  Administered 2014-04-24: 1000 mL via INTRAVENOUS

## 2014-04-24 MED ORDER — SUCCINYLCHOLINE CHLORIDE 20 MG/ML IJ SOLN
INTRAMUSCULAR | Status: AC
  Filled 2014-04-24: qty 1

## 2014-04-24 MED ORDER — LIDOCAINE HCL (CARDIAC) 20 MG/ML IV SOLN
INTRAVENOUS | Status: AC
  Filled 2014-04-24: qty 5

## 2014-04-24 MED ORDER — LIDOCAINE HCL (CARDIAC) 20 MG/ML IV SOLN
INTRAVENOUS | Status: DC | PRN
  Administered 2014-04-24: 50 mg via INTRAVENOUS

## 2014-04-24 MED ORDER — ONDANSETRON HCL 4 MG/2ML IJ SOLN
INTRAMUSCULAR | Status: DC | PRN
  Administered 2014-04-24: 4 mg via INTRAVENOUS

## 2014-04-24 MED ORDER — SODIUM CHLORIDE 0.9 % IV SOLN
INTRAVENOUS | Status: DC | PRN
  Administered 2014-04-24: 09:00:00 via INTRAVENOUS

## 2014-04-24 MED ORDER — DEXAMETHASONE SODIUM PHOSPHATE 4 MG/ML IJ SOLN
INTRAMUSCULAR | Status: DC | PRN
  Administered 2014-04-24: 4 mg via INTRAVENOUS

## 2014-04-24 SURGICAL SUPPLY — 19 items
BLADE 10 SAFETY STRL DISP (BLADE) ×2 IMPLANT
CANISTER SUCTION 2500CC (MISCELLANEOUS) ×2 IMPLANT
COVER TABLE BACK 60X90 (DRAPES) ×2 IMPLANT
DRAPE PROXIMA HALF (DRAPES) ×2 IMPLANT
GAUZE SPONGE 4X4 12PLY STRL (GAUZE/BANDAGES/DRESSINGS) ×2 IMPLANT
GLOVE BIO SURGEON STRL SZ7.5 (GLOVE) ×2 IMPLANT
GOWN STRL REUS W/ TWL LRG LVL3 (GOWN DISPOSABLE) ×2 IMPLANT
GOWN STRL REUS W/TWL LRG LVL3 (GOWN DISPOSABLE) ×2
GUARD TEETH (MISCELLANEOUS) ×2 IMPLANT
KIT ROOM TURNOVER OR (KITS) ×2 IMPLANT
MARKER SKIN DUAL TIP RULER LAB (MISCELLANEOUS) IMPLANT
NS IRRIG 1000ML POUR BTL (IV SOLUTION) IMPLANT
PAD ARMBOARD 7.5X6 YLW CONV (MISCELLANEOUS) ×4 IMPLANT
PATTIES SURGICAL .5 X1 (DISPOSABLE) IMPLANT
SOLUTION ANTI FOG 6CC (MISCELLANEOUS) IMPLANT
SURGILUBE 2OZ TUBE FLIPTOP (MISCELLANEOUS) ×2 IMPLANT
TOWEL OR 17X26 10 PK STRL BLUE (TOWEL DISPOSABLE) ×2 IMPLANT
TUBE CONNECTING 12X1/4 (SUCTIONS) ×2 IMPLANT
WATER STERILE IRR 1000ML POUR (IV SOLUTION) IMPLANT

## 2014-04-24 NOTE — Discharge Instructions (Signed)
Resume all previous activities.

## 2014-04-24 NOTE — ED Notes (Signed)
Pt arrived to the ED with a complaint of a cough.  Pt has a persistent cough in triage with limited pauses.  Pt states she ate a pork chop and then began coughing and became hoarse.  Pt is only able to vocalize in a whisper.  Pt states it feels as if she has her airway closing off

## 2014-04-24 NOTE — Anesthesia Preprocedure Evaluation (Addendum)
Anesthesia Evaluation  Patient identified by MRN, date of birth, ID band Patient awake    Airway Mallampati: II TM Distance: >3 FB Neck ROM: Full    Dental  (+) Teeth Intact, Dental Advisory Given   Pulmonary  breath sounds clear to auscultation        Cardiovascular Rhythm:Regular Rate:Normal     Neuro/Psych Anxiety Depression    GI/Hepatic   Endo/Other    Renal/GU      Musculoskeletal   Abdominal   Peds  Hematology   Anesthesia Other Findings   Reproductive/Obstetrics                         Anesthesia Physical Anesthesia Plan  ASA: II  Anesthesia Plan: General   Post-op Pain Management:    Induction: Intravenous  Airway Management Planned: Oral ETT  Additional Equipment:   Intra-op Plan:   Post-operative Plan: Extubation in OR  Informed Consent: I have reviewed the patients History and Physical, chart, labs and discussed the procedure including the risks, benefits and alternatives for the proposed anesthesia with the patient or authorized representative who has indicated his/her understanding and acceptance.   Dental advisory given  Plan Discussed with: CRNA and Anesthesiologist  Anesthesia Plan Comments:         Anesthesia Quick Evaluation

## 2014-04-24 NOTE — ED Notes (Signed)
Pt transported to short stay 

## 2014-04-24 NOTE — ED Notes (Signed)
Pt states she prepared pork chops this evening with salt, pepper, onion powder, garlic powder @ midnight. Pt states @ 5 min after eating began coughing. Cough is dry and constant. No oral swelling noted, no hives or rash.

## 2014-04-24 NOTE — Consult Note (Signed)
Reason for Consult: Laryngeal foreign body Referring Physician: April Smitty CordsK Palumbo-Rasch, MD  HPI:  Leah [name] is an 18 y.o. female who [facility] this AM c/o throat discomfort. She feels like throat is closing post eating boneless pork chop. Her neck Xray shows radiopaque foreign material in the upper cervical airway at the level of the piriform sinuses. No respiratory distress. No stridor. ENT consulted for foreign body removal.  Past Medical History  Diagnosis Date  . Anemia 2013  . Anxiety 2011    no meds during pregnancy  . Depression 2011  . 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 Depression 03/16/2013    History of Depression. Review PP. Have patient RTC in 2 weeks     Past Surgical History  Procedure Laterality Date  . Wisdom tooth extraction  2013    Family History  Problem Relation Age of Onset  . Arthritis Mother   . Bronchitis Mother   . Asthma Mother   . Hearing loss Paternal Grandfather   . Cancer Neg Hx   . Diabetes Neg Hx     Social History:  reports that she has never smoked. She has never used smokeless tobacco. She reports that she does not drink alcohol or use illicit drugs.  Allergies: No Known Allergies  Prior to Admission medications   Medication Sig Start Date End Date Taking? Authorizing Provider  acetaminophen (TYLENOL) 500 MG tablet Take 1,000 mg by mouth every 6 (six) hours as needed for mild pain.    Yes Historical Provider, MD  dicyclomine (BENTYL) 20 MG tablet Take 1 tablet (20 mg total) by mouth 2 (two) times daily as needed for spasms. 03/12/14  Yes Loren Raceravid Yelverton, MD  HYDROcodone-acetaminophen (NORCO/VICODIN) 5-325 MG per tablet Take 1-2 tablets by mouth every 6 (six) hours as needed for moderate pain or severe pain. 02/09/14  Yes Antony MaduraKelly Humes, PA-C  medroxyPROGESTERone (DEPO-PROVERA) 150 MG/ML injection Inject 1 mL (150 mg total) into the muscle every 3 (three) months. 03/10/14  Yes Josalyn C Funches, MD   polyethylene glycol (MIRALAX / GLYCOLAX) packet Take 17 g by mouth daily. 03/12/14  Yes Loren Raceravid Yelverton, MD    No results found for this or any previous visit (from the past 48 hour(s)).  Dg Neck Soft Tissue  04/24/2014   CLINICAL DATA:  Persistent cough. Patient 8 report top in then began coughing and became horse.  EXAM: NECK SOFT TISSUES - 1+ VIEW  COMPARISON:  None.  FINDINGS: Radiopaque foreign material is demonstrated in the upper cervical airway at the level of C3-4. This is localized at the level of the piriform sinuses. No prevertebral soft tissue swelling or submental soft tissue swelling. Visualized bones appear intact.  IMPRESSION: Radiopaque foreign material is demonstrated in the upper cervical airway at the level of the piriform sinuses.   Electronically Signed   By: Burman NievesWilliam  Stevens M.D.   On: 04/24/2014 03:04   Review of Systems  HENT: Negative for congestion and drooling.  Respiratory: Negative for shortness of breath.  Cardiovascular: Negative for chest pain.  Gastrointestinal: Negative for abdominal pain.  Neurological: Negative for headaches.  All other systems reviewed and are negative.  Blood pressure 101/64, pulse 89, temperature 98.3 F (36.8 C), temperature source Oral, resp. rate 12, height 5' (1.524 m), weight 84 lb (38.102 kg), SpO2 99.00%, not currently breastfeeding.  Physical Exam  Constitutional: She is oriented to person, place, and time. She appears well-developed and well-nourished. No  distress.  HENT:  Head: Normocephalic and atraumatic.  Mouth/Throat: Oropharynx is clear and moist. No oropharyngeal exudate.  No swelling of the lips tongue or uvula  Eyes: Conjunctivae and EOM are normal. Pupils are equal, round, and reactive to light.  Neck: Normal range of motion. Neck supple. No tracheal deviation present.  Cardiovascular: Normal rate, regular rhythm and intact distal pulses.  Pulmonary/Chest: Effort normal and breath sounds normal. No stridor. No  respiratory distress. She has no wheezes. She has no rales.  Abdominal: Soft. Bowel sounds are normal. There is no tenderness. There is no rebound and no guarding.  Musculoskeletal: Normal range of motion.  Neurological: She is alert and oriented to person, place, and time.  Skin: Skin is warm and dry.  Psychiatric: She has a normal mood and affect.   Assessment/Plan: Laryngeal foreign body. Plan direct laryngoscopy with foreign body removal. R/B/A discussed with the patient.  Leah [name],SUI W 04/24/2014, 7:37 AM

## 2014-04-24 NOTE — Brief Op Note (Signed)
04/24/2014  9:55 AM  PATIENT:  Leah [name]  18 y.o. female  PRE-OPERATIVE DIAGNOSIS:  Foreign body ingestion  POST-OPERATIVE DIAGNOSIS:  Foreign body ingestion  PROCEDURE:  Procedure(s): DIRECT LARYNGOSCOPY (N/A) Esophagoscopy  SURGEON:  Surgeon(s) and Role:    * Darletta MollSui W Cortne Amara, MD - Primary  PHYSICIAN ASSISTANT:   ASSISTANTS: none   ANESTHESIA:   general  EBL:  Total I/O In: 300 [I.V.:300] Out: -   BLOOD ADMINISTERED:none  DRAINS: none   LOCAL MEDICATIONS USED:  NONE  SPECIMEN:  No Specimen  DISPOSITION OF SPECIMEN:  N/A  COUNTS:  YES  TOURNIQUET:  * No tourniquets in log *  DICTATION: .Other Dictation: Dictation Number [id number]  PLAN OF CARE: Discharge to home after PACU  PATIENT DISPOSITION:  PACU - hemodynamically stable.   Delay start of Pharmacological VTE agent (>24hrs) due to surgical blood loss or risk of bleeding: not applicable

## 2014-04-24 NOTE — Anesthesia Procedure Notes (Signed)
Procedure Name: Intubation Date/Time: 04/24/2014 9:22 AM Performed by: Alanda AmassFRIEDMAN, Makalyn Lennox A Pre-anesthesia Checklist: Patient identified, Timeout performed, Emergency Drugs available, Suction available and Patient being monitored Patient Re-evaluated:Patient Re-evaluated prior to inductionOxygen Delivery Method: Circle system utilized Preoxygenation: Pre-oxygenation with 100% oxygen Intubation Type: IV induction Ventilation: Mask ventilation without difficulty Laryngoscope Size: Mac and 3 Grade View: Grade I Tube type: Oral Tube size: 6.5 mm Number of attempts: 1 Airway Equipment and Method: Stylet Placement Confirmation: ETT inserted through vocal cords under direct vision,  breath sounds checked- equal and bilateral and positive ETCO2 Secured at: 18 cm Tube secured with: Tape Dental Injury: Teeth and Oropharynx as per pre-operative assessment

## 2014-04-24 NOTE — ED Notes (Signed)
NAD noted. Pt states her throat hurts worse rates pain 10/10.

## 2014-04-24 NOTE — ED Notes (Signed)
[facility] at bedside 

## 2014-04-24 NOTE — ED Provider Notes (Signed)
CSN: [id number]     Arrival date & time 04/24/14  0130 History   First MD Initiated Contact with Patient 04/24/14 0211     Chief Complaint  Patient presents with  . Cough     (Consider location/radiation/quality/duration/timing/severity/associated sxs/prior Treatment) Patient is a 18 y.o. female presenting with pharyngitis. The history is provided by the patient.  Sore Throat This is a new problem. The current episode started 1 to 2 hours ago. The problem occurs constantly. The problem has not changed since onset.Pertinent negatives include no chest pain, no abdominal pain, no headaches and no shortness of breath. Nothing aggravates the symptoms. Nothing relieves the symptoms. She has tried nothing for the symptoms. The treatment provided no relief.  Feels like throat is closing post eating boneless pork chop.    Past Medical History  Diagnosis Date  . Anemia 2013  . Anxiety 2011    no meds during pregnancy  . Depression 2011  . 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 Depression 03/16/2013    History of Depression. Review PP. Have patient RTC in 2 weeks    Past Surgical History  Procedure Laterality Date  . Wisdom tooth extraction  2013   Family History  Problem Relation Age of Onset  . Arthritis Mother   . Bronchitis Mother   . Asthma Mother   . Hearing loss Paternal Grandfather   . Cancer Neg Hx   . Diabetes Neg Hx    History  Substance Use Topics  . Smoking status: Never Smoker   . Smokeless tobacco: Never Used  . Alcohol Use: No   OB History   Grav Para Term Preterm Abortions TAB SAB Ect Mult Living   1 1 1  0 0 0 0 0 0 1     Review of Systems  HENT: Negative for congestion and drooling.   Respiratory: Negative for shortness of breath.   Cardiovascular: Negative for chest pain.  Gastrointestinal: Negative for abdominal pain.  Neurological: Negative for headaches.  All other systems reviewed and are negative.     Allergies  Review of  patient's allergies indicates no known allergies.  Home Medications   Prior to Admission medications   Medication Sig Start Date End Date Taking? Authorizing Provider  acetaminophen (TYLENOL) 500 MG tablet Take 1,000 mg by mouth every 6 (six) hours as needed for mild pain.    Yes Historical Provider, MD  dicyclomine (BENTYL) 20 MG tablet Take 1 tablet (20 mg total) by mouth 2 (two) times daily as needed for spasms. 03/12/14  Yes Loren Racer, MD  HYDROcodone-acetaminophen (NORCO/VICODIN) 5-325 MG per tablet Take 1-2 tablets by mouth every 6 (six) hours as needed for moderate pain or severe pain. 02/09/14  Yes Antony Madura, PA-C  medroxyPROGESTERone (DEPO-PROVERA) 150 MG/ML injection Inject 1 mL (150 mg total) into the muscle every 3 (three) months. 03/10/14  Yes Josalyn C Funches, MD  polyethylene glycol (MIRALAX / GLYCOLAX) packet Take 17 g by mouth daily. 03/12/14  Yes Loren Racer, MD   BP 99/66  Pulse 80  Temp(Src) 99.1 F (37.3 C) (Oral)  Resp 18  Ht 5\' 4"  (1.626 m)  Wt 84 lb (38.102 kg)  BMI 14.41 kg/m2  SpO2 100% Physical Exam  Constitutional: She is oriented to person, place, and time. She appears well-developed and well-nourished. No distress.  HENT:  Head: Normocephalic and atraumatic.  Mouth/Throat: Oropharynx is clear and moist. No oropharyngeal exudate.  No swelling of the lips tongue or  uvula  Eyes: Conjunctivae and EOM are normal. Pupils are equal, round, and reactive to light.  Neck: Normal range of motion. Neck supple. No tracheal deviation present.  Cardiovascular: Normal rate, regular rhythm and intact distal pulses.   Pulmonary/Chest: Effort normal and breath sounds normal. No stridor. No respiratory distress. She has no wheezes. She has no rales.  Abdominal: Soft. Bowel sounds are normal. There is no tenderness. There is no rebound and no guarding.  Musculoskeletal: Normal range of motion.  Neurological: She is alert and oriented to person, place, and time.   Skin: Skin is warm and dry.  Psychiatric: She has a normal mood and affect.    ED Course  Procedures (including critical care time) Labs Review Labs Reviewed - No data to display  Imaging Review Dg Neck Soft Tissue  04/24/2014   CLINICAL DATA:  Persistent cough. Patient 8 report top in then began coughing and became horse.  EXAM: NECK SOFT TISSUES - 1+ VIEW  COMPARISON:  None.  FINDINGS: Radiopaque foreign material is demonstrated in the upper cervical airway at the level of C3-4. This is localized at the level of the piriform sinuses. No prevertebral soft tissue swelling or submental soft tissue swelling. Visualized bones appear intact.  IMPRESSION: Radiopaque foreign material is demonstrated in the upper cervical airway at the level of the piriform sinuses.   Electronically Signed   By: Burman NievesWilliam  Stevens M.D.   On: 04/24/2014 03:04     EKG Interpretation None      MDM   Final diagnoses:  None    Foreign body in the piriform sinus  332 am case d/w Dr. Suszanne Connerseoh [facility]  No need to call.  Will see in am and take to OR in am maintain NPO.    338 case d/w Dr. Belenda CruiseKristin Ward DO who is aware of the transfer    Assad Harbeson K Charnel Giles-Rasch, MD 04/24/14 [phone]

## 2014-04-24 NOTE — Transfer of Care (Signed)
Immediate Anesthesia Transfer of Care Note  Patient: Leah [name]  Procedure(s) Performed: Procedure(s): DIRECT LARYNGOSCOPY (N/A) REMOVAL FOREIGN BODY ESOPHAGEAL (N/A)  Patient Location: PACU  Anesthesia Type:General  Level of Consciousness: awake  Airway & Oxygen Therapy: Patient Spontanous Breathing  Post-op Assessment: Report given to PACU RN and Post -op Vital signs reviewed and stable  Post vital signs: Reviewed and stable  Complications: No apparent anesthesia complications

## 2014-04-24 NOTE — ED Notes (Signed)
Spoke with OR; pt on schedule and has one case ahead of her

## 2014-04-24 NOTE — ED Notes (Signed)
Report given to Ian MalkinZach, RN in ED and [facility].

## 2014-04-24 NOTE — Anesthesia Postprocedure Evaluation (Signed)
  Anesthesia Post-op Note  Patient: Leah [name]  Procedure(s) Performed: Procedure(s): DIRECT LARYNGOSCOPY (N/A) REMOVAL FOREIGN BODY ESOPHAGEAL (N/A)  Patient Location: PACU  Anesthesia Type:General  Level of Consciousness: awake, alert  and oriented  Airway and Oxygen Therapy: Patient Spontanous Breathing  Post-op Pain: none  Post-op Assessment: Post-op Vital signs reviewed, Patient's Cardiovascular Status Stable, Respiratory Function Stable, Patent Airway, No signs of Nausea or vomiting and Pain level controlled  Post-op Vital Signs: stable  Last Vitals:  Filed Vitals:   04/24/14 1045  BP:   Pulse: 88  Temp: 37 C  Resp: 18    Complications: No apparent anesthesia complications

## 2014-04-24 NOTE — Op Note (Signed)
NAMKirk [name]:  Gindlesperger, Ramey                  ACCOUNT NO.:  [id number]  MEDICAL RECORD NO.:  [id number]  LOCATION:  [facility]                         FACILITY:  [facility]  PHYSICIAN:  Newman PiesSu Darry Kelnhofer, MD            DATE OF BIRTH:  07-22-1996  DATE OF PROCEDURE:  04/24/2014 DATE OF DISCHARGE:                              OPERATIVE REPORT   SURGEON:  Newman PiesSu Scotland Korver, MD  PREOPERATIVE DIAGNOSIS:  Foreign body ingestion.  POSTOPERATIVE DIAGNOSIS:  Foreign body ingestion.  PROCEDURE PERFORMED: 1. Direct laryngoscopy. 2. Esophagoscopy.  ANESTHESIA:  General endotracheal tube anesthesia.  COMPLICATIONS:  None.  ESTIMATED BLOOD LOSS:  None.  INDICATION FOR PROCEDURE:  The patient is a 18 year old female who initially presented to [facility] room this morning, complaining of a strangulation sensation in her throat.  On her neck x- ray, possible radiopaque foreign body was noted within the piriform sinus.  The patient was therefore [facility] for direct laryngoscopy, esophagoscopy, and possible foreign body removal.  The risks, benefits, alternatives, and details of the procedure were discussed with the patient and her mother.  Questions were invited and answered.  Informed consent was obtained.  DESCRIPTION:  The patient was taken to the operating room and placed supine on the operating table.  General endotracheal tube anesthesia was administered by the anesthesiologist.  The patient was positioned and prepped and draped in a standard fashion for laryngoscopy examination.  A mouth guard was placed to protect the patient's dentition.  A Dedo laryngoscope was inserted via the oral cavity into the pharynx.  The tongue base, vallecula, epiglottis, piriform sinuses, vocal cords, and arytenoids were all noted to be normal.  No foreign body was noted.  The Dedo laryngoscope was withdrawn.  The rigid esophagoscope was then inserted via the oral cavity into the esophageal inlet.   An esophagoscope was then advanced, examined the rest of the esophageal mucosa.  No foreign body was noted.  The rigid esophagoscope was removed.  The patient tolerated the procedure well.  Care of the patient was turned over to the anesthesiologist.  The patient was awakened from anesthesia without difficulty.  She was extubated and transferred to the recovery room in good condition.  OPERATIVE FINDINGS:  No foreign body was noted on today's direct laryngoscopy and esophagoscopy.  The radiopaque foreign body was likely swallowed.  SPECIMEN:  None.  FOLLOWUP CARE:  The patient will be discharged home.  She may follow up in my office on a p.r.n. basis.     Newman PiesSu Delan Ksiazek, MD     ST/MEDQ  D:  04/24/2014  T:  04/24/2014  Job:  [id number]

## 2014-04-25 ENCOUNTER — Encounter (HOSPITAL_COMMUNITY): Payer: Self-pay | Admitting: Otolaryngology

## 2014-05-23 ENCOUNTER — Encounter (HOSPITAL_COMMUNITY): Payer: Self-pay | Admitting: Otolaryngology

## 2014-05-26 ENCOUNTER — Ambulatory Visit: Payer: Self-pay

## 2014-06-02 DIAGNOSIS — Z8659 Personal history of other mental and behavioral disorders: Secondary | ICD-10-CM | POA: Insufficient documentation

## 2014-06-02 DIAGNOSIS — Z862 Personal history of diseases of the blood and blood-forming organs and certain disorders involving the immune mechanism: Secondary | ICD-10-CM | POA: Insufficient documentation

## 2014-06-02 DIAGNOSIS — R1084 Generalized abdominal pain: Secondary | ICD-10-CM | POA: Diagnosis not present

## 2014-06-02 DIAGNOSIS — Z79899 Other long term (current) drug therapy: Secondary | ICD-10-CM | POA: Diagnosis not present

## 2014-06-02 DIAGNOSIS — K8020 Calculus of gallbladder without cholecystitis without obstruction: Secondary | ICD-10-CM | POA: Diagnosis not present

## 2014-06-02 DIAGNOSIS — Z8669 Personal history of other diseases of the nervous system and sense organs: Secondary | ICD-10-CM | POA: Diagnosis not present

## 2014-06-02 DIAGNOSIS — Z3202 Encounter for pregnancy test, result negative: Secondary | ICD-10-CM | POA: Diagnosis not present

## 2014-06-02 DIAGNOSIS — R109 Unspecified abdominal pain: Secondary | ICD-10-CM | POA: Diagnosis present

## 2014-06-02 NOTE — ED Notes (Signed)
Pt in c/o lower abd pain with nausea for the last three weeks, denies other symptoms, no distress noted

## 2014-06-03 ENCOUNTER — Encounter (HOSPITAL_COMMUNITY): Payer: Self-pay | Admitting: *Deleted

## 2014-06-03 ENCOUNTER — Emergency Department (HOSPITAL_COMMUNITY)
Admission: EM | Admit: 2014-06-03 | Discharge: 2014-06-03 | Disposition: A | Discharge status: Home / Self Care | Payer: Medicaid Other | Attending: Emergency Medicine | Admitting: Emergency Medicine

## 2014-06-03 DIAGNOSIS — K8020 Calculus of gallbladder without cholecystitis without obstruction: Secondary | ICD-10-CM

## 2014-06-03 DIAGNOSIS — R1084 Generalized abdominal pain: Secondary | ICD-10-CM

## 2014-06-03 LAB — URINALYSIS, ROUTINE W REFLEX MICROSCOPIC
Bilirubin Urine: NEGATIVE
Glucose, UA: NEGATIVE mg/dL
Hgb urine dipstick: NEGATIVE
Ketones, ur: NEGATIVE mg/dL
Leukocytes, UA: NEGATIVE
Nitrite: NEGATIVE
PROTEIN: NEGATIVE mg/dL
Specific Gravity, Urine: 1.035 — ABNORMAL HIGH (ref 1.005–1.030)
UROBILINOGEN UA: 0.2 mg/dL (ref 0.0–1.0)
pH: 5.5 (ref 5.0–8.0)

## 2014-06-03 LAB — COMPREHENSIVE METABOLIC PANEL
ALK PHOS: 66 U/L (ref 39–117)
ALT: 19 U/L (ref 0–35)
ANION GAP: 14 (ref 5–15)
AST: 18 U/L (ref 0–37)
Albumin: 3.9 g/dL (ref 3.5–5.2)
BUN: 16 mg/dL (ref 6–23)
CO2: 23 mEq/L (ref 19–32)
CREATININE: 0.63 mg/dL (ref 0.50–1.10)
Calcium: 9.3 mg/dL (ref 8.4–10.5)
Chloride: 105 mEq/L (ref 96–112)
GFR calc non Af Amer: >90 mL/min (ref >90)
GLUCOSE: 82 mg/dL (ref 70–99)
Potassium: 3.6 mEq/L — ABNORMAL LOW (ref 3.7–5.3)
Sodium: 142 mEq/L (ref 137–147)
TOTAL PROTEIN: 6.9 g/dL (ref 6.0–8.3)
Total Bilirubin: 0.4 mg/dL (ref 0.3–1.2)

## 2014-06-03 LAB — CBC WITH DIFFERENTIAL/PLATELET
Basophils Absolute: 0 10*3/uL (ref 0.0–0.1)
Basophils Relative: 0 % (ref 0–1)
EOS PCT: 2 % (ref 0–5)
Eosinophils Absolute: 0.1 10*3/uL (ref 0.0–0.7)
HCT: 34.5 % — ABNORMAL LOW (ref 36.0–46.0)
Hemoglobin: 11.9 g/dL — ABNORMAL LOW (ref 12.0–15.0)
LYMPHS ABS: 3.6 10*3/uL (ref 0.7–4.0)
Lymphocytes Relative: 51 % — ABNORMAL HIGH (ref 12–46)
MCH: 30.3 pg (ref 26.0–34.0)
MCHC: 34.5 g/dL (ref 30.0–36.0)
MCV: 87.8 fL (ref 78.0–100.0)
MONO ABS: 0.4 10*3/uL (ref 0.1–1.0)
Monocytes Relative: 6 % (ref 3–12)
Neutro Abs: 2.8 10*3/uL (ref 1.7–7.7)
Neutrophils Relative %: 41 % — ABNORMAL LOW (ref 43–77)
Platelets: 239 10*3/uL (ref 150–400)
RBC: 3.93 MIL/uL (ref 3.87–5.11)
RDW: 12.3 % (ref 11.5–15.5)
WBC: 6.9 10*3/uL (ref 4.0–10.5)

## 2014-06-03 LAB — LIPASE, BLOOD: Lipase: 34 U/L (ref 11–59)

## 2014-06-03 LAB — POC URINE PREG, ED: PREG TEST UR: NEGATIVE

## 2014-06-03 MED ORDER — TRAMADOL HCL 50 MG PO TABS: 50.0000 mg | ORAL_TABLET | Freq: Four times a day (QID) | ORAL | Status: DC | PRN

## 2014-06-03 MED ORDER — HYDROMORPHONE HCL 1 MG/ML IJ SOLN
1.0000 mg | Freq: Once | INTRAMUSCULAR | Status: AC
  Administered 2014-06-03: 1 mg via INTRAVENOUS
  Filled 2014-06-03: qty 1

## 2014-06-03 MED ORDER — SODIUM CHLORIDE 0.9 % IV BOLUS (SEPSIS)
1000.0000 mL | Freq: Once | INTRAVENOUS | Status: AC
  Administered 2014-06-03: 1000 mL via INTRAVENOUS

## 2014-06-03 MED ORDER — RANITIDINE HCL 150 MG PO TABS: 150.0000 mg | ORAL_TABLET | Freq: Two times a day (BID) | ORAL | Status: DC

## 2014-06-03 MED ORDER — ONDANSETRON HCL 4 MG/2ML IJ SOLN
4.0000 mg | Freq: Once | INTRAMUSCULAR | Status: AC
  Administered 2014-06-03: 4 mg via INTRAVENOUS
  Filled 2014-06-03: qty 2

## 2014-06-03 NOTE — ED Notes (Signed)
Pt. Refused wheelchair 

## 2014-06-03 NOTE — Discharge Instructions (Signed)
Abdominal Pain °Many things can cause abdominal pain. Usually, abdominal pain is not caused by a disease and will improve without treatment. It can often be observed and treated at home. Your health care provider will do a physical exam and possibly order blood tests and X-rays to help determine the seriousness of your pain. However, in many cases, more time must pass before a clear cause of the pain can be found. Before that point, your health care provider may not know if you need more testing or further treatment. °HOME CARE INSTRUCTIONS  °Monitor your abdominal pain for any changes. The following actions may help to alleviate any discomfort you are experiencing: °· Only take over-the-counter or prescription medicines as directed by your health care provider. °· Do not take laxatives unless directed to do so by your health care provider. °· Try a clear liquid diet (broth, tea, or water) as directed by your health care provider. Slowly move to a bland diet as tolerated. °SEEK MEDICAL CARE IF: °· You have unexplained abdominal pain. °· You have abdominal pain associated with nausea or diarrhea. °· You have pain when you urinate or have a bowel movement. °· You experience abdominal pain that wakes you in the night. °· You have abdominal pain that is worsened or improved by eating food. °· You have abdominal pain that is worsened with eating fatty foods. °· You have a fever. °SEEK IMMEDIATE MEDICAL CARE IF:  °· Your pain does not go away within 2 hours. °· You keep throwing up (vomiting). °· Your pain is felt only in portions of the abdomen, such as the right side or the left lower portion of the abdomen. °· You pass bloody or black tarry stools. °MAKE SURE YOU: °· Understand these instructions.   °· Will watch your condition.   °· Will get help right away if you are not doing well or get worse.   °Document Released: 04/17/2005 Document Revised: 07/13/2013 Document Reviewed: 03/17/2013 °ExitCare® Patient Information  ©2015 [employer]. This information is not intended to replace advice given to you by your health care provider. Make sure you discuss any questions you have with your health care provider. ° °Cholelithiasis °Cholelithiasis (also called gallstones) is a form of gallbladder disease in which gallstones form in your gallbladder. The gallbladder is an organ that stores bile made in the liver, which helps digest fats. Gallstones begin as small crystals and slowly grow into stones. Gallstone pain occurs when the gallbladder spasms and a gallstone is blocking the duct. Pain can also occur when a stone passes out of the duct.  °RISK FACTORS °· Being female.   °· Having multiple pregnancies. Health care providers sometimes advise removing diseased gallbladders before future pregnancies.   °· Being obese. °· Eating a diet heavy in fried foods and fat.   °· Being older than 60 years and increasing age.   °· Prolonged use of medicines containing female hormones.   °· Having diabetes mellitus.   °· Rapidly losing weight.   °· Having a family history of gallstones (heredity).   °SYMPTOMS °· Nausea.   °· Vomiting. °· Abdominal pain.   °· Yellowing of the skin (jaundice).   °· Sudden pain. It may persist from several minutes to several hours. °· Fever.   °· Tenderness to the touch.  °In some cases, when gallstones do not move into the bile duct, people have no pain or symptoms. These are called "silent" gallstones.  °TREATMENT °Silent gallstones do not need treatment. In severe cases, emergency surgery may be required. Options for treatment include: °· Surgery to remove   the gallbladder. This is the most common treatment. °· Medicines. These do not always work and may take 6-12 months or more to work. °· Shock wave treatment (extracorporeal biliary lithotripsy). In this treatment an ultrasound machine sends shock waves to the gallbladder to break gallstones into smaller pieces that can pass into the intestines or be dissolved by  medicine. °HOME CARE INSTRUCTIONS  °· Only take over-the-counter or prescription medicines for pain, discomfort, or fever as directed by your health care provider.   °· Follow a low-fat diet until seen again by your health care provider. Fat causes the gallbladder to contract, which can result in pain.   °· Follow up with your health care provider as directed. Attacks are almost always recurrent and surgery is usually required for permanent treatment.   °SEEK IMMEDIATE MEDICAL CARE IF:  °· Your pain increases and is not controlled by medicines.   °· You have a fever or persistent symptoms for more than 2-3 days.   °· You have a fever and your symptoms suddenly get worse.   °· You have persistent nausea and vomiting.   °MAKE SURE YOU:  °· Understand these instructions. °· Will watch your condition. °· Will get help right away if you are not doing well or get worse. °Document Released: 07/04/2005 Document Revised: 03/10/2013 Document Reviewed: 12/30/2012 °ExitCare® Patient Information ©2015 [employer]. This information is not intended to replace advice given to you by your health care provider. Make sure you discuss any questions you have with your health care provider. ° °

## 2014-06-03 NOTE — ED Provider Notes (Signed)
CSN: [id number]     Arrival date & time 06/02/14  2351 History   First MD Initiated Contact with Patient 06/03/14 0101     Chief Complaint  Patient presents with  . Abdominal Pain     (Consider location/radiation/quality/duration/timing/severity/associated sxs/prior Treatment) HPI Comments: Patient presents to the ER for evaluation of abdominal pain. Patient reports that the pain has been present for 2 or 3 weeks intermittently. Patient reports sharp pains with nausea but has not vomited. There is no fever. Patient reports that she has a history of gallstone, but this feels different. Denies urinary symptoms. No vaginal bleeding or discharge.  Patient is a 18 y.o. female presenting with abdominal pain.  Abdominal Pain   Past Medical History  Diagnosis Date  . Anemia 2013  . Anxiety 2011    no meds during pregnancy  . Depression 2011  . 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 Depression 03/16/2013    History of Depression. Review PP. Have patient RTC in 2 weeks    Past Surgical History  Procedure Laterality Date  . Wisdom tooth extraction  2013  . Direct laryngoscopy N/A 04/24/2014    Procedure: DIRECT LARYNGOSCOPY;  Surgeon: Darletta MollSui W Teoh, MD;  [facility];  Service: ENT;  Laterality: N/A;  . Foreign body removal esophageal N/A 04/24/2014    Procedure: REMOVAL FOREIGN BODY ESOPHAGEAL;  Surgeon: Darletta MollSui W Teoh, MD;  [facility];  Service: ENT;  Laterality: N/A;   Family History  Problem Relation Age of Onset  . Arthritis Mother   . Bronchitis Mother   . Asthma Mother   . Hearing loss Paternal Grandfather   . Cancer Neg Hx   . Diabetes Neg Hx    History  Substance Use Topics  . Smoking status: Never Smoker   . Smokeless tobacco: Never Used  . Alcohol Use: No   OB History    Gravida Para Term Preterm AB TAB SAB Ectopic Multiple Living   1 1 1  0 0 0 0 0 0 1     Review of Systems  Gastrointestinal: Positive for abdominal pain.  All other systems  reviewed and are negative.     Allergies  Review of patient's allergies indicates no known allergies.  Home Medications   Prior to Admission medications   Medication Sig Start Date End Date Taking? Authorizing Provider  acetaminophen (TYLENOL) 500 MG tablet Take 1,000 mg by mouth every 6 (six) hours as needed for mild pain.    Yes Historical Provider, MD  dicyclomine (BENTYL) 20 MG tablet Take 1 tablet (20 mg total) by mouth 2 (two) times daily as needed for spasms. 03/12/14  Yes Loren Raceravid Yelverton, MD  HYDROcodone-acetaminophen (NORCO/VICODIN) 5-325 MG per tablet Take 1-2 tablets by mouth every 6 (six) hours as needed for moderate pain or severe pain. 02/09/14  Yes Antony MaduraKelly Humes, PA-C  medroxyPROGESTERone (DEPO-PROVERA) 150 MG/ML injection Inject 1 mL (150 mg total) into the muscle every 3 (three) months. 03/10/14  Yes Josalyn C Funches, MD  polyethylene glycol (MIRALAX / GLYCOLAX) packet Take 17 g by mouth daily. Patient taking differently: Take 17 g by mouth daily as needed for mild constipation.  03/12/14  Yes Loren Raceravid Yelverton, MD   BP 93/47 mmHg  Pulse 60  Temp(Src) 98.5 F (36.9 C) (Oral)  Resp 10  Ht 5' (1.524 m)  Wt 84 lb (38.102 kg)  BMI 16.41 kg/m2  SpO2 100% Physical Exam  Constitutional: She is oriented to person, place,  and time. She appears well-developed and well-nourished. No distress.  HENT:  Head: Normocephalic and atraumatic.  Right Ear: Hearing normal.  Left Ear: Hearing normal.  Nose: Nose normal.  Mouth/Throat: Oropharynx is clear and moist and mucous membranes are normal.  Eyes: Conjunctivae and EOM are normal. Pupils are equal, round, and reactive to light.  Neck: Normal range of motion. Neck supple.  Cardiovascular: Regular rhythm, S1 normal and S2 normal.  Exam reveals no gallop and no friction rub.   No murmur heard. Pulmonary/Chest: Effort normal and breath sounds normal. No respiratory distress. She exhibits no tenderness.  Abdominal: Soft. Normal  appearance and bowel sounds are normal. There is no hepatosplenomegaly. There is tenderness in the right upper quadrant, epigastric area, periumbilical area and left upper quadrant. There is no rebound, no guarding, no tenderness at McBurney's point and negative Murphy's sign. No hernia.  Musculoskeletal: Normal range of motion.  Neurological: She is alert and oriented to person, place, and time. She has normal strength. No cranial nerve deficit or sensory deficit. Coordination normal. GCS eye subscore is 4. GCS verbal subscore is 5. GCS motor subscore is 6.  Skin: Skin is warm, dry and intact. No rash noted. No cyanosis.  Psychiatric: She has a normal mood and affect. Her speech is normal and behavior is normal. Thought content normal.  Nursing note and vitals reviewed.   ED Course  Procedures (including critical care time) Labs Review Labs Reviewed  CBC WITH DIFFERENTIAL - Abnormal; Notable for the following:    Hemoglobin 11.9 (*)    HCT 34.5 (*)    Neutrophils Relative % 41 (*)    Lymphocytes Relative 51 (*)    All other components within normal limits  COMPREHENSIVE METABOLIC PANEL - Abnormal; Notable for the following:    Potassium 3.6 (*)    All other components within normal limits  URINALYSIS, ROUTINE W REFLEX MICROSCOPIC - Abnormal; Notable for the following:    APPearance CLOUDY (*)    Specific Gravity, Urine 1.035 (*)    All other components within normal limits  LIPASE, BLOOD  POC URINE PREG, ED    Imaging Review No results found.   EKG Interpretation None      MDM   Final diagnoses:  None  abdominal pain Cholelithiasis  Patient presents to the ER for evaluation of abdominal pain. Patient complaining of diffuse abdominal pain. She has had multiple  Visits for abdominal pain in the past. She does, however, have a history of cholelithiasis. She did have tenderness in the right upper quadrant, but no Murphy sign. Symptoms might be consistent with biliary colic,  she has had resolution of pain after IV analgesia. Patient's lab work is normal, no concern for acute cholecystitis. Discharge, analgesia, follow up with general surgery.    Gilda Creasehristopher J. Pollina, MD 06/03/14 [phone]

## 2014-06-06 ENCOUNTER — Encounter: Payer: Self-pay | Admitting: Obstetrics & Gynecology

## 2014-06-06 ENCOUNTER — Ambulatory Visit (INDEPENDENT_AMBULATORY_CARE_PROVIDER_SITE_OTHER): Payer: Medicaid Other | Admitting: Obstetrics & Gynecology

## 2014-06-06 VITALS — BP 114/70 | HR 91 | Temp 98.1°F | Ht 60.0 in | Wt 96.0 lb

## 2014-06-06 DIAGNOSIS — Z113 Encounter for screening for infections with a predominantly sexual mode of transmission: Secondary | ICD-10-CM

## 2014-06-06 NOTE — Progress Notes (Signed)
Patient ID: Leah [name], female   DOB: 1995-09-11, 18 y.o.   MRN: [id number]  Chief Complaint  Patient presents with  . Follow-up    TOC    HPI Leah [name] is a 18 y.o. female.  The patient was treated for PID with an outpatient regimen.  She has no complaints today.  HPI  Past Medical History  Diagnosis Date  . Anemia 2013  . Anxiety 2011    no meds during pregnancy  . Depression 2011  . 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 Depression 03/16/2013    History of Depression. Review PP. Have patient RTC in 2 weeks     Past Surgical History  Procedure Laterality Date  . Wisdom tooth extraction  2013  . Direct laryngoscopy N/A 04/24/2014    Procedure: DIRECT LARYNGOSCOPY;  Surgeon: Darletta MollSui W Teoh, MD;  [facility];  Service: ENT;  Laterality: N/A;  . Foreign body removal esophageal N/A 04/24/2014    Procedure: REMOVAL FOREIGN BODY ESOPHAGEAL;  Surgeon: Darletta MollSui W Teoh, MD;  [facility];  Service: ENT;  Laterality: N/A;    Family History  Problem Relation Age of Onset  . Arthritis Mother   . Bronchitis Mother   . Asthma Mother   . Hearing loss Paternal Grandfather   . Cancer Neg Hx   . Diabetes Neg Hx     Social History History  Substance Use Topics  . Smoking status: Never Smoker   . Smokeless tobacco: Never Used  . Alcohol Use: No    No Known Allergies  Current Outpatient Prescriptions  Medication Sig Dispense Refill  . dicyclomine (BENTYL) 20 MG tablet Take 1 tablet (20 mg total) by mouth 2 (two) times daily as needed for spasms. 20 tablet 0  . HYDROcodone-acetaminophen (NORCO/VICODIN) 5-325 MG per tablet Take 1-2 tablets by mouth every 6 (six) hours as needed for moderate pain or severe pain. 15 tablet 0  . medroxyPROGESTERone (DEPO-PROVERA) 150 MG/ML injection Inject 1 mL (150 mg total) into the muscle every 3 (three) months. 1 mL 5  . polyethylene glycol (MIRALAX / GLYCOLAX) packet Take 17 g by mouth daily. (Patient taking differently: Take  17 g by mouth daily as needed for mild constipation. ) 14 each 0  . ranitidine (ZANTAC) 150 MG tablet Take 1 tablet (150 mg total) by mouth 2 (two) times daily. 60 tablet 0  . traMADol (ULTRAM) 50 MG tablet Take 1 tablet (50 mg total) by mouth every 6 (six) hours as needed. 15 tablet 0   Current Facility-Administered Medications  Medication Dose Route Frequency Provider Last Rate Last Dose  . medroxyPROGESTERone (DEPO-PROVERA) injection 150 mg  150 mg Intramuscular Q90 days Amy Dessa PhiHowell Wren, CNM   150 mg at 03/10/14 1803    Review of Systems Review of Systems Constitutional: negative for fatigue and weight loss Respiratory: negative for cough and wheezing Cardiovascular: negative for chest pain, fatigue and palpitations Gastrointestinal: negative for abdominal pain and change in bowel habits Genitourinary:negative for abnormal vaginal discharge Integument/breast: negative for nipple discharge Musculoskeletal:negative for myalgias Neurological: negative for gait problems and tremors Behavioral/Psych: negative for abusive relationship, depression Endocrine: negative for temperature intolerance     Blood pressure 114/70, pulse 91, temperature 98.1 F (36.7 C), height 5' (1.524 m), weight 43.545 kg (96 lb), not currently breastfeeding.  Physical Exam Physical Exam General:   alert  Skin:   no rash or abnormalities  Lungs:   clear to auscultation bilaterally  Heart:   regular rate and rhythm, S1, S2 normal, no murmur, click, rub or gallop  Abdomen:  normal findings: no organomegaly, soft, non-tender and no hernia  Pelvis:  External genitalia: normal general appearance Urinary system: urethral meatus normal and bladder without fullness, nontender Vaginal: normal without tenderness, induration or masses Cervix: normal appearance Adnexa: normal bimanual exam Uterus: anteverted and non-tender, normal size      Data Reviewed labs  Assessment    Normal exam     Plan    Orders  Placed This Encounter  Procedures  . GC/Chlamydia Probe Amp  . HIV antibody  . RPR    Follow up as needed.         JACKSON-MOORE,Laquandra Carrillo A 06/06/2014, 5:23 PM

## 2014-06-06 NOTE — Patient Instructions (Signed)

## 2014-06-07 LAB — HIV ANTIBODY (ROUTINE TESTING W REFLEX): HIV 1&2 Ab, 4th Generation: NONREACTIVE

## 2014-06-07 LAB — GC/CHLAMYDIA PROBE AMP
CT Probe RNA: NEGATIVE
GC Probe RNA: NEGATIVE

## 2014-06-07 LAB — RPR

## 2014-06-08 ENCOUNTER — Ambulatory Visit: Payer: Medicaid Other

## 2014-07-07 ENCOUNTER — Encounter (HOSPITAL_COMMUNITY): Payer: Self-pay | Admitting: *Deleted

## 2014-07-07 ENCOUNTER — Emergency Department (HOSPITAL_COMMUNITY)
Admission: EM | Admit: 2014-07-07 | Discharge: 2014-07-07 | Disposition: A | Discharge status: Home / Self Care | Payer: Medicaid Other | Attending: Emergency Medicine | Admitting: Emergency Medicine

## 2014-07-07 DIAGNOSIS — F419 Anxiety disorder, unspecified: Secondary | ICD-10-CM | POA: Insufficient documentation

## 2014-07-07 DIAGNOSIS — Z8669 Personal history of other diseases of the nervous system and sense organs: Secondary | ICD-10-CM | POA: Insufficient documentation

## 2014-07-07 DIAGNOSIS — Z862 Personal history of diseases of the blood and blood-forming organs and certain disorders involving the immune mechanism: Secondary | ICD-10-CM | POA: Diagnosis not present

## 2014-07-07 DIAGNOSIS — R109 Unspecified abdominal pain: Secondary | ICD-10-CM | POA: Insufficient documentation

## 2014-07-07 DIAGNOSIS — F329 Major depressive disorder, single episode, unspecified: Secondary | ICD-10-CM | POA: Insufficient documentation

## 2014-07-07 DIAGNOSIS — Z3202 Encounter for pregnancy test, result negative: Secondary | ICD-10-CM | POA: Diagnosis not present

## 2014-07-07 DIAGNOSIS — Z79899 Other long term (current) drug therapy: Secondary | ICD-10-CM | POA: Diagnosis not present

## 2014-07-07 LAB — CBC WITH DIFFERENTIAL/PLATELET
BASOS ABS: 0 10*3/uL (ref 0.0–0.1)
BASOS PCT: 0 % (ref 0–1)
EOS ABS: 0 10*3/uL (ref 0.0–0.7)
Eosinophils Relative: 1 % (ref 0–5)
HEMATOCRIT: 38.9 % (ref 36.0–46.0)
Hemoglobin: 13 g/dL (ref 12.0–15.0)
Lymphocytes Relative: 37 % (ref 12–46)
Lymphs Abs: 2.5 10*3/uL (ref 0.7–4.0)
MCH: 29.7 pg (ref 26.0–34.0)
MCHC: 33.4 g/dL (ref 30.0–36.0)
MCV: 88.8 fL (ref 78.0–100.0)
MONO ABS: 0.3 10*3/uL (ref 0.1–1.0)
Monocytes Relative: 4 % (ref 3–12)
NEUTROS ABS: 4 10*3/uL (ref 1.7–7.7)
Neutrophils Relative %: 58 % (ref 43–77)
Platelets: 228 10*3/uL (ref 150–400)
RBC: 4.38 MIL/uL (ref 3.87–5.11)
RDW: 12.2 % (ref 11.5–15.5)
WBC: 6.9 10*3/uL (ref 4.0–10.5)

## 2014-07-07 LAB — COMPREHENSIVE METABOLIC PANEL
ALBUMIN: 4.4 g/dL (ref 3.5–5.2)
ALK PHOS: 66 U/L (ref 39–117)
ALT: 17 U/L (ref 0–35)
AST: 18 U/L (ref 0–37)
Anion gap: 14 (ref 5–15)
BILIRUBIN TOTAL: 0.9 mg/dL (ref 0.3–1.2)
BUN: 13 mg/dL (ref 6–23)
CHLORIDE: 102 meq/L (ref 96–112)
CO2: 23 mEq/L (ref 19–32)
Calcium: 9.7 mg/dL (ref 8.4–10.5)
Creatinine, Ser: 0.66 mg/dL (ref 0.50–1.10)
GFR calc non Af Amer: >90 mL/min (ref >90)
GLUCOSE: 79 mg/dL (ref 70–99)
POTASSIUM: 3.7 meq/L (ref 3.7–5.3)
Sodium: 139 mEq/L (ref 137–147)
Total Protein: 8 g/dL (ref 6.0–8.3)

## 2014-07-07 LAB — URINALYSIS, ROUTINE W REFLEX MICROSCOPIC
Bilirubin Urine: NEGATIVE
Glucose, UA: NEGATIVE mg/dL
HGB URINE DIPSTICK: NEGATIVE
KETONES UR: NEGATIVE mg/dL
Nitrite: NEGATIVE
PROTEIN: NEGATIVE mg/dL
Specific Gravity, Urine: 1.026 (ref 1.005–1.030)
UROBILINOGEN UA: 0.2 mg/dL (ref 0.0–1.0)
pH: 5.5 (ref 5.0–8.0)

## 2014-07-07 LAB — PREGNANCY, URINE: Preg Test, Ur: NEGATIVE

## 2014-07-07 LAB — URINE MICROSCOPIC-ADD ON

## 2014-07-07 LAB — LIPASE, BLOOD: Lipase: 25 U/L (ref 11–59)

## 2014-07-07 MED ORDER — HYOSCYAMINE SULFATE 0.125 MG PO TABS
0.1250 mg | ORAL_TABLET | Freq: Once | ORAL | Status: AC
  Administered 2014-07-07: 0.125 mg via ORAL
  Filled 2014-07-07: qty 1

## 2014-07-07 NOTE — ED Notes (Signed)
Bed: WA02 Expected date:  Expected time:  Means of arrival:  Comments: Triage--recurrent abd pain, has not taken meds

## 2014-07-07 NOTE — Discharge Instructions (Signed)
The cause of your abdominal pain was not identified today.  Get rechecked tomorrow if your pain comes back or you develop fevers, vomiting, or new concerning symptoms.     Abdominal Pain, Women Abdominal (stomach, pelvic, or belly) pain can be caused by many things. It is important to tell your doctor:  The location of the pain.  Does it come and go or is it present all the time?  Are there things that start the pain (eating certain foods, exercise)?  Are there other symptoms associated with the pain (fever, nausea, vomiting, diarrhea)? All of this is helpful to know when trying to find the cause of the pain. CAUSES   Stomach: virus or bacteria infection, or ulcer.  Intestine: appendicitis (inflamed appendix), regional ileitis (Crohn's disease), ulcerative colitis (inflamed colon), irritable bowel syndrome, diverticulitis (inflamed diverticulum of the colon), or cancer of the stomach or intestine.  Gallbladder disease or stones in the gallbladder.  Kidney disease, kidney stones, or infection.  Pancreas infection or cancer.  Fibromyalgia (pain disorder).  Diseases of the female organs:  Uterus: fibroid (non-cancerous) tumors or infection.  Fallopian tubes: infection or tubal pregnancy.  Ovary: cysts or tumors.  Pelvic adhesions (scar tissue).  Endometriosis (uterus lining tissue growing in the pelvis and on the pelvic organs).  Pelvic congestion syndrome (female organs filling up with blood just before the menstrual period).  Pain with the menstrual period.  Pain with ovulation (producing an egg).  Pain with an IUD (intrauterine device, birth control) in the uterus.  Cancer of the female organs.  Functional pain (pain not caused by a disease, may improve without treatment).  Psychological pain.  Depression. DIAGNOSIS  Your doctor will decide the seriousness of your pain by doing an examination.  Blood tests.  X-rays.  Ultrasound.  CT scan (computed  tomography, special type of X-ray).  MRI (magnetic resonance imaging).  Cultures, for infection.  Barium enema (dye inserted in the large intestine, to better view it with X-rays).  Colonoscopy (looking in intestine with a lighted tube).  Laparoscopy (minor surgery, looking in abdomen with a lighted tube).  Major abdominal exploratory surgery (looking in abdomen with a large incision). TREATMENT  The treatment will depend on the cause of the pain.   Many cases can be observed and treated at home.  Over-the-counter medicines recommended by your caregiver.  Prescription medicine.  Antibiotics, for infection.  Birth control pills, for painful periods or for ovulation pain.  Hormone treatment, for endometriosis.  Nerve blocking injections.  Physical therapy.  Antidepressants.  Counseling with a psychologist or psychiatrist.  Minor or major surgery. HOME CARE INSTRUCTIONS   Do not take laxatives, unless directed by your caregiver.  Take over-the-counter pain medicine only if ordered by your caregiver. Do not take aspirin because it can cause an upset stomach or bleeding.  Try a clear liquid diet (broth or water) as ordered by your caregiver. Slowly move to a bland diet, as tolerated, if the pain is related to the stomach or intestine.  Have a thermometer and take your temperature several times a day, and record it.  Bed rest and sleep, if it helps the pain.  Avoid sexual intercourse, if it causes pain.  Avoid stressful situations.  Keep your follow-up appointments and tests, as your caregiver orders.  If the pain does not go away with medicine or surgery, you may try:  Acupuncture.  Relaxation exercises (yoga, meditation).  Group therapy.  Counseling. SEEK MEDICAL CARE IF:   You notice  certain foods cause stomach pain.  Your home care treatment is not helping your pain.  You need stronger pain medicine.  You want your IUD removed.  You feel faint  or lightheaded.  You develop nausea and vomiting.  You develop a rash.  You are having side effects or an allergy to your medicine. SEEK IMMEDIATE MEDICAL CARE IF:   Your pain does not go away or gets worse.  You have a fever.  Your pain is felt only in portions of the abdomen. The right side could possibly be appendicitis. The left lower portion of the abdomen could be colitis or diverticulitis.  You are passing blood in your stools (bright red or black tarry stools, with or without vomiting).  You have blood in your urine.  You develop chills, with or without a fever.  You pass out. MAKE SURE YOU:   Understand these instructions.  Will watch your condition.  Will get help right away if you are not doing well or get worse. Document Released: 05/05/2007 Document Revised: 11/22/2013 Document Reviewed: 05/25/2009 [facility] Patient Information 2015 [place], [place]. This information is not intended to replace advice given to you by your health care provider. Make sure you discuss any questions you have with your health care provider.

## 2014-07-07 NOTE — ED Notes (Signed)
Pt states she has had constant, sharp 9/10 RLQ pain since 6PM. Pt states she has had nausea for the past week, but has not vomited.

## 2014-07-07 NOTE — ED Provider Notes (Signed)
CSN: [id number]     Arrival date & time 07/07/14  1917 History   First MD Initiated Contact with Patient 07/07/14 1923     Chief Complaint  Patient presents with  . Abdominal Pain      Patient is a 18 y.o. female presenting with abdominal pain. The history is provided by the patient. No language interpreter was used.  Abdominal Pain  Miss Early CharsSiler is here for evaluation of right-sided abdominal pain. She has a history of recurrent abdominal pain, but this is different than her typical pain. She has pain in her right mid abdomen that is described as sharp in nature, it radiates to her right flank. Pain started about an hour and a half ago. Pain is better when pressure is applied to her abdomen. She denies any fevers, vomiting, dysuria, vaginal discharge, diarrhea, constipation. She does have some nausea. Symptoms are moderate, constant.  Past Medical History  Diagnosis Date  . Anemia 2013  . Anxiety 2011    no meds during pregnancy  . Depression 2011  . 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 Depression 03/16/2013    History of Depression. Review PP. Have patient RTC in 2 weeks    Past Surgical History  Procedure Laterality Date  . Wisdom tooth extraction  2013  . Direct laryngoscopy N/A 04/24/2014    Procedure: DIRECT LARYNGOSCOPY;  Surgeon: Darletta MollSui W Teoh, MD;  [facility];  Service: ENT;  Laterality: N/A;  . Foreign body removal esophageal N/A 04/24/2014    Procedure: REMOVAL FOREIGN BODY ESOPHAGEAL;  Surgeon: Darletta MollSui W Teoh, MD;  [facility];  Service: ENT;  Laterality: N/A;   Family History  Problem Relation Age of Onset  . Arthritis Mother   . Bronchitis Mother   . Asthma Mother   . Hearing loss Paternal Grandfather   . Cancer Neg Hx   . Diabetes Neg Hx    History  Substance Use Topics  . Smoking status: Never Smoker   . Smokeless tobacco: Never Used  . Alcohol Use: No   OB History    Gravida Para Term Preterm AB TAB SAB Ectopic Multiple Living   1 1 1  0 0 0 0 0 0 1     Review of Systems  Gastrointestinal: Positive for abdominal pain.  All other systems reviewed and are negative.     Allergies  Review of patient's allergies indicates no known allergies.  Home Medications   Prior to Admission medications   Medication Sig Start Date End Date Taking? Authorizing Provider  dicyclomine (BENTYL) 20 MG tablet Take 1 tablet (20 mg total) by mouth 2 (two) times daily as needed for spasms. 03/12/14   Loren Raceravid Yelverton, MD  HYDROcodone-acetaminophen (NORCO/VICODIN) 5-325 MG per tablet Take 1-2 tablets by mouth every 6 (six) hours as needed for moderate pain or severe pain. 02/09/14   Antony MaduraKelly Humes, PA-C  medroxyPROGESTERone (DEPO-PROVERA) 150 MG/ML injection Inject 1 mL (150 mg total) into the muscle every 3 (three) months. 03/10/14   Lora PaulaJosalyn C Funches, MD  polyethylene glycol (MIRALAX / GLYCOLAX) packet Take 17 g by mouth daily. Patient taking differently: Take 17 g by mouth daily as needed for mild constipation.  03/12/14   Loren Raceravid Yelverton, MD  ranitidine (ZANTAC) 150 MG tablet Take 1 tablet (150 mg total) by mouth 2 (two) times daily. 06/03/14   Gilda Creasehristopher J. Pollina, MD  traMADol (ULTRAM) 50 MG tablet Take 1 tablet (50 mg total) by mouth every 6 (six)  hours as needed. 06/03/14   Gilda Creasehristopher J. Pollina, MD   BP 120/68 mmHg  Pulse 87  Temp(Src) 98.8 F (37.1 C) (Oral)  Resp 18  SpO2 100% Physical Exam  Constitutional: She is oriented to person, place, and time. She appears well-developed and well-nourished.  HENT:  Head: Normocephalic and atraumatic.  Cardiovascular: Normal rate and regular rhythm.   No murmur heard. Pulmonary/Chest: Effort normal and breath sounds normal. No respiratory distress.  Abdominal: Soft. There is no rebound and no guarding.  Mild diffuse tenderness, greatest over right abdomen without guarding or rebound.   Musculoskeletal: She exhibits no edema or tenderness.  Neurological: She is alert and oriented to  person, place, and time.  Skin: Skin is warm and dry.  Psychiatric: She has a normal mood and affect. Her behavior is normal.  Nursing note and vitals reviewed.   ED Course  Procedures (including critical care time) Labs Review Labs Reviewed  URINALYSIS, ROUTINE W REFLEX MICROSCOPIC - Abnormal; Notable for the following:    APPearance CLOUDY (*)    Leukocytes, UA MODERATE (*)    All other components within normal limits  URINE MICROSCOPIC-ADD ON - Abnormal; Notable for the following:    Squamous Epithelial / LPF FEW (*)    Bacteria, UA FEW (*)    All other components within normal limits  COMPREHENSIVE METABOLIC PANEL  CBC WITH DIFFERENTIAL  PREGNANCY, URINE  LIPASE, BLOOD    Imaging Review No results found.   EKG Interpretation None      MDM   Final diagnoses:  Right sided abdominal pain    Patient here for evaluation of right-sided abdominal pain. On recheck patient's pain is resolved. Clinical picture is not consistent with acute cholecystitis, acute appendicitis, acute urinary tract infection. Discussed with patient need for repeat abdominal exam and return precautions if her pain recurs.    Tilden FossaElizabeth Orly Quimby, MD 07/07/14 2123

## 2014-07-18 ENCOUNTER — Encounter: Payer: Self-pay | Admitting: *Deleted

## 2014-07-19 ENCOUNTER — Encounter: Payer: Self-pay | Admitting: Obstetrics & Gynecology

## 2014-08-10 ENCOUNTER — Emergency Department (HOSPITAL_COMMUNITY)
Admission: EM | Admit: 2014-08-10 | Discharge: 2014-08-11 | Disposition: A | Discharge status: Home / Self Care | Payer: Medicaid Other | Attending: Emergency Medicine | Admitting: Emergency Medicine

## 2014-08-10 DIAGNOSIS — Z3202 Encounter for pregnancy test, result negative: Secondary | ICD-10-CM | POA: Diagnosis not present

## 2014-08-10 DIAGNOSIS — R109 Unspecified abdominal pain: Secondary | ICD-10-CM | POA: Insufficient documentation

## 2014-08-10 DIAGNOSIS — Z862 Personal history of diseases of the blood and blood-forming organs and certain disorders involving the immune mechanism: Secondary | ICD-10-CM | POA: Insufficient documentation

## 2014-08-10 DIAGNOSIS — F419 Anxiety disorder, unspecified: Secondary | ICD-10-CM | POA: Diagnosis not present

## 2014-08-10 DIAGNOSIS — F329 Major depressive disorder, single episode, unspecified: Secondary | ICD-10-CM | POA: Insufficient documentation

## 2014-08-10 DIAGNOSIS — R110 Nausea: Secondary | ICD-10-CM | POA: Insufficient documentation

## 2014-08-10 DIAGNOSIS — Z79899 Other long term (current) drug therapy: Secondary | ICD-10-CM | POA: Diagnosis not present

## 2014-08-10 DIAGNOSIS — Z791 Long term (current) use of non-steroidal anti-inflammatories (NSAID): Secondary | ICD-10-CM | POA: Insufficient documentation

## 2014-08-11 ENCOUNTER — Encounter (HOSPITAL_COMMUNITY): Payer: Self-pay

## 2014-08-11 LAB — POC URINE PREG, ED: PREG TEST UR: NEGATIVE

## 2014-08-11 MED ORDER — ONDANSETRON 4 MG PO TBDP
4.0000 mg | ORAL_TABLET | Freq: Once | ORAL | Status: AC
  Administered 2014-08-11: 4 mg via ORAL
  Filled 2014-08-11: qty 1

## 2014-08-11 NOTE — Discharge Instructions (Signed)
You were able to eat chili cheese fries and a milkshake while in the department  Please make an appointment with your PCP for follow up evaluations

## 2014-08-11 NOTE — ED Notes (Signed)
Pt has eaten chili cheese fries and a milkshake that pt's friend brought to her. Still reports nausea. No emesis noted. Dondra SpryGail NP made aware.

## 2014-08-11 NOTE — ED Notes (Signed)
PT given gingerale at this time

## 2014-08-11 NOTE — ED Notes (Signed)
Pt complains of abdominal pain for two weeks, was dx with gallstones in July

## 2014-08-11 NOTE — ED Provider Notes (Signed)
CSN: [id number]     Arrival date & time 08/10/14  2354 History   First MD Initiated Contact with Patient 08/11/14 0037     Chief Complaint  Patient presents with  . Abdominal Pain     (Consider location/radiation/quality/duration/timing/severity/associated sxs/prior Treatment) HPI Comments: Cramping abdominal pain with nausea Denies vomiting, dysuria, cough, fever, constipation, diarrhea, vaginal discharge/bleeding.  Last Depo shot was due in November but did not get it Has been sexually active since without alternative form of birth control   Patient is a 19 y.o. female presenting with abdominal pain. The history is provided by the patient.  Abdominal Pain Pain location:  Generalized Pain quality: cramping   Pain radiates to:  Does not radiate Pain severity:  Mild Onset quality:  Gradual Duration:  2 weeks Timing:  Intermittent Progression:  Waxing and waning Chronicity:  New Context: recent sexual activity   Context: not alcohol use, not recent illness, not retching and not trauma   Relieved by:  Nothing Worsened by:  Nothing tried Ineffective treatments: took one bentyl about 2 hours ago without relief. Associated symptoms: nausea   Associated symptoms: no chills, no constipation, no cough, no diarrhea, no dysuria, no fever, no flatus, no hematemesis, no hematochezia, no hematuria, no shortness of breath, no sore throat, no vaginal bleeding, no vaginal discharge and no vomiting     Past Medical History  Diagnosis Date  . Anemia 2013  . Anxiety 2011    no meds during pregnancy  . 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 Depression 03/16/2013    History of Depression. Review PP. Have patient RTC in 2 weeks    Past Surgical History  Procedure Laterality Date  . Wisdom tooth extraction  2013  . Direct laryngoscopy N/A 04/24/2014    Procedure: DIRECT LARYNGOSCOPY;  Surgeon: Darletta MollSui W Teoh, MD;  [facility];  Service: ENT;  Laterality: N/A;  . Foreign  body removal esophageal N/A 04/24/2014    Procedure: REMOVAL FOREIGN BODY ESOPHAGEAL;  Surgeon: Darletta MollSui W Teoh, MD;  [facility];  Service: ENT;  Laterality: N/A;   Family History  Problem Relation Age of Onset  . Arthritis Mother   . Bronchitis Mother   . Asthma Mother   . Hearing loss Paternal Grandfather   . Cancer Neg Hx   . Diabetes Neg Hx    History  Substance Use Topics  . Smoking status: Never Smoker   . Smokeless tobacco: Never Used  . Alcohol Use: No   OB History    Gravida Para Term Preterm AB TAB SAB Ectopic Multiple Living   1 1 1  0 0 0 0 0 0 1     Review of Systems  Constitutional: Negative for fever and chills.  HENT: Negative for sore throat.   Respiratory: Negative for cough and shortness of breath.   Gastrointestinal: Positive for nausea and abdominal pain. Negative for vomiting, diarrhea, constipation, hematochezia, abdominal distention, flatus and hematemesis.  Genitourinary: Negative for dysuria, hematuria, flank pain, vaginal bleeding, vaginal discharge, difficulty urinating, menstrual problem and pelvic pain.  All other systems reviewed and are negative.     Allergies  Review of patient's allergies indicates no known allergies.  Home Medications   Prior to Admission medications   Medication Sig Start Date End Date Taking? Authorizing Provider  dicyclomine (BENTYL) 20 MG tablet Take 1 tablet (20 mg total) by mouth 2 (two) times daily as needed for spasms. 03/12/14  Yes Loren Raceravid Yelverton, MD  ibuprofen (ADVIL,MOTRIN) 600 MG tablet Take 600 mg by mouth every 6 (six) hours as needed for headache (headache).   Yes Historical Provider, MD  polyethylene glycol (MIRALAX / GLYCOLAX) packet Take 17 g by mouth daily. Patient taking differently: Take 17 g by mouth daily as needed for mild constipation.  03/12/14  Yes Loren Racer, MD  ranitidine (ZANTAC) 150 MG tablet Take 1 tablet (150 mg total) by mouth 2 (two) times daily. 06/03/14  Yes Gilda Crease,  MD  traMADol (ULTRAM) 50 MG tablet Take 1 tablet (50 mg total) by mouth every 6 (six) hours as needed. Patient taking differently: Take 50 mg by mouth every 6 (six) hours as needed for moderate pain.  06/03/14  Yes Gilda Crease, MD  HYDROcodone-acetaminophen (NORCO/VICODIN) 5-325 MG per tablet Take 1-2 tablets by mouth every 6 (six) hours as needed for moderate pain or severe pain. Patient not taking: Reported on 08/11/2014 02/09/14   Antony Madura, PA-C  medroxyPROGESTERone (DEPO-PROVERA) 150 MG/ML injection Inject 1 mL (150 mg total) into the muscle every 3 (three) months. Patient not taking: Reported on 07/07/2014 03/10/14   Ellison Carwin Funches, MD   BP 93/51 mmHg  Pulse 62  Temp(Src) 97.7 F (36.5 C) (Oral)  Resp 18  SpO2 99% Physical Exam  Constitutional: She is oriented to person, place, and time. She appears well-developed and well-nourished.  HENT:  Head: Normocephalic.  Eyes: Pupils are equal, round, and reactive to light.  Neck: Normal range of motion.  Cardiovascular: Normal rate and regular rhythm.   Pulmonary/Chest: Effort normal and breath sounds normal.  Abdominal: Soft. Bowel sounds are normal. She exhibits no distension. There is no tenderness.  Musculoskeletal: Normal range of motion. She exhibits no edema.  Neurological: She is alert and oriented to person, place, and time.  Skin: Skin is warm and dry.  Nursing note and vitals reviewed.   ED Course  Procedures (including critical care time) Labs Review Labs Reviewed  POC URINE PREG, ED    Imaging Review No results found.   EKG Interpretation None      MDM   Final diagnoses:  Nausea         Arman Filter, NP 08/11/14 2130  Hanley Seamen, MD 08/11/14 2251

## 2014-08-17 ENCOUNTER — Encounter (HOSPITAL_COMMUNITY): Payer: Self-pay | Admitting: *Deleted

## 2014-08-17 ENCOUNTER — Inpatient Hospital Stay (HOSPITAL_COMMUNITY)
Admission: AD | Admit: 2014-08-17 | Discharge: 2014-08-17 | Disposition: A | Discharge status: Home / Self Care | Payer: Medicaid Other | Source: Ambulatory Visit | Attending: Obstetrics | Admitting: Obstetrics

## 2014-08-17 DIAGNOSIS — R112 Nausea with vomiting, unspecified: Secondary | ICD-10-CM | POA: Insufficient documentation

## 2014-08-17 DIAGNOSIS — R1013 Epigastric pain: Secondary | ICD-10-CM | POA: Diagnosis not present

## 2014-08-17 DIAGNOSIS — Z3202 Encounter for pregnancy test, result negative: Secondary | ICD-10-CM

## 2014-08-17 DIAGNOSIS — R109 Unspecified abdominal pain: Secondary | ICD-10-CM | POA: Diagnosis present

## 2014-08-17 HISTORY — DX: Calculus of gallbladder without cholecystitis without obstruction: K80.20

## 2014-08-17 LAB — URINALYSIS, ROUTINE W REFLEX MICROSCOPIC
Bilirubin Urine: NEGATIVE
Glucose, UA: NEGATIVE mg/dL
Hgb urine dipstick: NEGATIVE
Ketones, ur: NEGATIVE mg/dL
Leukocytes, UA: NEGATIVE
NITRITE: NEGATIVE
PH: 7 (ref 5.0–8.0)
PROTEIN: NEGATIVE mg/dL
SPECIFIC GRAVITY, URINE: 1.02 (ref 1.005–1.030)
Urobilinogen, UA: 0.2 mg/dL (ref 0.0–1.0)

## 2014-08-17 LAB — POCT PREGNANCY, URINE: Preg Test, Ur: NEGATIVE

## 2014-08-17 LAB — WET PREP, GENITAL
Clue Cells Wet Prep HPF POC: NONE SEEN
Trich, Wet Prep: NONE SEEN
YEAST WET PREP: NONE SEEN

## 2014-08-17 NOTE — MAU Note (Signed)
Pt was on depo, last dose was in August.  Hasn't had a period at all since then.  Upper abd pain for the last 4 weeks.  Denies dysuria.

## 2014-08-17 NOTE — MAU Provider Note (Signed)
History     CSN: [id number]  Arrival date and time: 08/17/14 7829   First Provider Initiated Contact with Patient 08/17/14 1100      No chief complaint on file.  HPI    [name] is a 19 y.o. female G1P1001 who presents to MAU for a pregnancy test. She received depo in August by [facility]. She did not go back for another shot of depo because she did not want it. She had spotting on Saturday and thought it was implantation bleeding; so she came today for a pregnancy test. She denies bleeding currently.  She would also like testing for STD's .   She is also concerned because she continues to have milk production from her left breast. She stopped breastfeeding her baby 9 months ago and when she squeezes her Left nipple she produces a drop of milk.   OB History    Gravida Para Term Preterm AB TAB SAB Ectopic Multiple Living   0 0 0 0 0 0 1      Past Medical History  Diagnosis Date  . Anemia 2013  . Anxiety 2011    no meds during pregnancy  . 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 Depression 03/16/2013    History of Depression. Review PP. Have patient RTC in 2 weeks   . Gall stones     Past Surgical History  Procedure Laterality Date  . Wisdom tooth extraction  2013  . Direct laryngoscopy N/A 04/24/2014    Procedure: DIRECT LARYNGOSCOPY;  Surgeon: [name], MD;  Location: [facility] OR;  Service: ENT;  Laterality: N/A;  . Foreign body removal esophageal N/A 04/24/2014    Procedure: REMOVAL FOREIGN BODY ESOPHAGEAL;  Surgeon: [name], MD;  Location: [facility] OR;  Service: ENT;  Laterality: N/A;    Family History  Problem Relation Age of Onset  . Arthritis Mother   . Bronchitis Mother   . Asthma Mother   . Hearing loss Paternal Grandfather   . Cancer Neg Hx   . Diabetes Neg Hx     History  Substance Use Topics  . Smoking status: Never Smoker   . Smokeless tobacco: Never Used  . Alcohol Use: No    Allergies: No Known  Allergies  Facility-administered medications prior to admission  Medication Dose Route Frequency Provider Last Rate Last Dose  . medroxyPROGESTERone (DEPO-PROVERA) injection 150 mg  150 mg Intramuscular Q90 days Amy Dessa Phi, CNM   150 mg at 03/10/14 1803   Prescriptions prior to admission  Medication Sig Dispense Refill Last Dose  . dicyclomine (BENTYL) 20 MG tablet Take 1 tablet (20 mg total) by mouth 2 (two) times daily as needed for spasms. 20 tablet 0 Past Month at Unknown time  . traMADol (ULTRAM) 50 MG tablet Take 1 tablet (50 mg total) by mouth every 6 (six) hours as needed. (Patient taking differently: Take 50 mg by mouth every 6 (six) hours as needed for moderate pain or severe pain. ) 15 tablet 0 Past Month at Unknown time  . HYDROcodone-acetaminophen (NORCO/VICODIN) 5-325 MG per tablet Take 1-2 tablets by mouth every 6 (six) hours as needed for moderate pain or severe pain. (Patient not taking: Reported on 08/11/2014) 15 tablet 0 Completed Course at Unknown time  . ibuprofen (ADVIL,MOTRIN) 600 MG tablet Take 600 mg by mouth every 6 (six) hours as needed for headache (headache).   08/15/2014  . medroxyPROGESTERone (DEPO-PROVERA)  150 MG/ML injection Inject 1 mL (150 mg total) into the muscle every 3 (three) months. (Patient not taking: Reported on 07/07/2014) 1 mL 5 Not Taking at Unknown time  . polyethylene glycol (MIRALAX / GLYCOLAX) packet Take 17 g by mouth daily. (Patient taking differently: Take 17 g by mouth daily as needed for mild constipation. ) 14 each 0 08/14/2014  . ranitidine (ZANTAC) 150 MG tablet Take 1 tablet (150 mg total) by mouth 2 (two) times daily. (Patient not taking: Reported on 08/17/2014) 60 tablet 0 Past Month at Unknown time   Results for orders placed or performed during the hospital encounter of 08/17/14 (from the past 48 hour(s))  Urinalysis, Routine w reflex microscopic     Status: None   Collection Time: 08/17/14 10:15 AM  Result Value Ref Range   Color,  Urine YELLOW YELLOW   APPearance CLEAR CLEAR   Specific Gravity, Urine 1.020 1.005 - 1.030   pH 7.0 5.0 - 8.0   Glucose, UA NEGATIVE NEGATIVE mg/dL   Hgb urine dipstick NEGATIVE NEGATIVE   Bilirubin Urine NEGATIVE NEGATIVE   Ketones, ur NEGATIVE NEGATIVE mg/dL   Protein, ur NEGATIVE NEGATIVE mg/dL   Urobilinogen, UA 0.2 0.0 - 1.0 mg/dL   Nitrite NEGATIVE NEGATIVE   Leukocytes, UA NEGATIVE NEGATIVE    Comment: MICROSCOPIC NOT DONE ON URINES WITH NEGATIVE PROTEIN, BLOOD, LEUKOCYTES, NITRITE, OR GLUCOSE <1000 mg/dL.  Pregnancy, urine POC     Status: None   Collection Time: 08/17/14 10:23 AM  Result Value Ref Range   Preg Test, Ur NEGATIVE NEGATIVE    Comment:        THE SENSITIVITY OF THIS METHODOLOGY IS >24 mIU/mL   Wet prep, genital     Status: Abnormal   Collection Time: 08/17/14 11:10 AM  Result Value Ref Range   Yeast Wet Prep HPF POC NONE SEEN NONE SEEN   Trich, Wet Prep NONE SEEN NONE SEEN   Clue Cells Wet Prep HPF POC NONE SEEN NONE SEEN   WBC, Wet Prep HPF POC FEW (A) NONE SEEN    Comment: MODERATE BACTERIA SEEN    Review of Systems  Constitutional: Negative for fever and chills.  Gastrointestinal: Positive for nausea. Negative for vomiting, abdominal pain (None currently), diarrhea and constipation.  Genitourinary: Negative for dysuria, urgency, frequency and hematuria.   Physical Exam   Blood pressure 106/67, pulse 80, temperature 98.8 F (37.1 C), temperature source Oral, resp. rate 16, not currently breastfeeding.  Physical Exam  Constitutional: She is oriented to person, place, and time. She appears well-developed and well-nourished. No distress.  HENT:  Head: Normocephalic.  Eyes: Pupils are equal, round, and reactive to light.  Neck: Neck supple.  Cardiovascular: Normal rate and normal heart sounds.   Respiratory: Effort normal. No respiratory distress. Left breast exhibits nipple discharge. Left breast exhibits no inverted nipple, no mass, no skin change  and no tenderness.    Musculoskeletal: Normal range of motion.  Neurological: She is alert and oriented to person, place, and time.  Skin: Skin is warm. She is not diaphoretic.  Psychiatric: Her behavior is normal.    MAU Course  Procedures  None  MDM Wet prep collected by RN  GC HIV  UA  UPT   Assessment and Plan   A:  Encounter for pregnancy test with results negative Nipple discharge  P:  Discharge home in stable condition Follow up with Dr. Clearance Coots Refrain from nipple stimulation  Return to MAU for emergencies only  Iona HansenJennifer Irene Colten Desroches, NP 08/19/2014 11:23 PM

## 2014-08-17 NOTE — MAU Note (Signed)
Having intermittent N&V with epigatric pain that pt rates a 10 for past 4 weeks;

## 2014-08-17 NOTE — MAU Note (Signed)
Hx of gall stones; c/o still havingmilk in L breast -stopped breastfeeding 9 months ago;

## 2014-08-17 NOTE — MAU Note (Signed)
[facility];

## 2014-08-17 NOTE — Discharge Instructions (Signed)
Pregnancy Tests °HOW DO PREGNANCY TESTS WORK? °All pregnancy tests look for a special hormone in the urine or blood that is only present in pregnant women. This hormone, human chorionic gonadotropin (hCG), is also called the pregnancy hormone.  °WHAT IS THE DIFFERENCE BETWEEN A URINE AND A BLOOD PREGNANCY TEST? IS ONE BETTER THAN THE OTHER? °There are two types of pregnancy tests. °· Blood tests. °· Urine tests. °Both tests look for the presence of hCG, the pregnancy hormone. Many women use a urine test or home pregnancy test (HPT) to find out if they are pregnant. HPTs are cheap, easy to use, can be done at home, and are private. When a woman has a positive result on an HPT, she needs to see her caregiver right away. The caregiver can confirm a positive HPT result with another urine test, a blood test, ultrasound, and a pelvic exam.  °There are two types of blood tests you can get from a caregiver.  °· A quantitative blood test (or the beta hCG test). This test measures the exact amount of hCG in the blood. This means it can pick up very small amounts of hCG, making it a very accurate test. °· A qualitative hCG blood test. This test gives a simple yes or no answer to whether you are pregnant. This test is more like a urine test in terms of its accuracy. °Blood tests can pick up hCG earlier in a pregnancy than urine tests can. Blood tests can tell if you are pregnant about 6 to 8 days after you release an egg from an ovary (ovulate). Urine tests can determine pregnancy about 2 weeks after ovulation.  °HOW IS A HOME PREGNANCY TEST DONE?  °There are many types of home pregnancy tests or HPTs that can be bought over-the-counter at drug or discount stores.  °· Some involve collecting your urine in a cup and dipping a stick into the urine or putting some of the urine into a special container with an eyedropper. °· Others are done by placing a stick into your urine stream. °· Tests vary in how long you need to wait for  the stick or container to turn a certain color or have a symbol on it (like a plus or a minus). °· All tests come with written instructions. Most tests also have toll-free phone numbers to call if you have any questions about how to do the test or read the results. °HOW ACCURATE ARE HOME PREGNANCY TESTS?  °HPTs are very accurate. Most brands of HPTs say they are 97% to 99% accurate when taken 1 week after missing your menstrual period, but this can vary with actual use. Each brand varies in how sensitive it is in picking up the pregnancy hormone hCG. If a test is not done correctly, it will be less accurate. Always check the package to make sure it is not past its expiration date. If it is, it will not be accurate. Most brands of HPTs tell users to do the test again in a few days, no matter what the results.  °If you use an HPT too early in your pregnancy, you may not have enough of the pregnancy hormone hCG in your urine to have a positive test result. Most HPTs will be accurate if you test yourself around the time your period is due (about 2 weeks after you ovulate). You can get a negative test result if you are not pregnant or if you ovulated later than you thought you did.   You may also have problems with the pregnancy, which affects the amount of hCG you have in your urine. If your HPT is negative, test yourself again within a few days to 1 week. If you keep getting a negative result and think you are pregnant, talk with your caregiver right away about getting a blood pregnancy test.  °FALSE POSITIVE PREGNANCY TEST °A false positive HPT can happen if there is blood or protein present in your urine. A false positive can also happen if you were recently pregnant or if you take a pregnancy test too soon after taking fertility drug that contains hCG. Also, some prescription medicines such as water pills (diuretics), tranquilizers, seizure medicines, psychiatric medicines, and allergy and nausea medicines  (promethazine) give false positive readings. °FALSE NEGATIVE PREGNANCY TEST °· A false negative HPT can happen if you do the test too early. Try to wait until you are at least 1 day late for your menstrual period. °· It may happen if you wait too long to test the urine (longer than 15 minutes). °· It may also happen if the urine is too diluted because you drank a lot of fluids before getting the urine sample. It is best to test the first morning urine after you get out of bed. °If your menstrual period did not start after a week of a negative HPT, repeat the pregnancy test. °CAN ANYTHING INTERFERE WITH HOME PREGNANCY TEST RESULTS?  °Most medicines, both over-the-counter and prescription drugs, including birth control pills and antibiotics, should not affect the results of a HPT. Only those drugs that have the pregnancy hormone hCG in them can give a false positive test result. Drugs that have hCG in them may be used for treating infertility (not being able to get pregnant). Alcohol and illegal drugs do not affect HPT results, but you should not be using these substances if you are trying to get pregnant. If you have a positive pregnancy test, call your caregiver to make an appointment to begin prenatal care. °Document Released: 07/11/2003 Document Revised: 09/30/2011 Document Reviewed: 10/22/2013 °[employer]® Patient Information ©2015 [employer]. This information is not intended to replace advice given to you by your health care provider. Make sure you discuss any questions you have with your health care provider. ° °

## 2014-08-18 ENCOUNTER — Other Ambulatory Visit: Payer: Self-pay | Admitting: Obstetrics

## 2014-08-18 ENCOUNTER — Emergency Department (HOSPITAL_COMMUNITY): Payer: Medicaid Other

## 2014-08-18 ENCOUNTER — Emergency Department (HOSPITAL_COMMUNITY)
Admission: EM | Admit: 2014-08-18 | Discharge: 2014-08-18 | Disposition: A | Discharge status: Home / Self Care | Payer: Medicaid Other | Attending: Emergency Medicine | Admitting: Emergency Medicine

## 2014-08-18 ENCOUNTER — Encounter (HOSPITAL_COMMUNITY): Payer: Self-pay

## 2014-08-18 DIAGNOSIS — Z8719 Personal history of other diseases of the digestive system: Secondary | ICD-10-CM | POA: Diagnosis not present

## 2014-08-18 DIAGNOSIS — F329 Major depressive disorder, single episode, unspecified: Secondary | ICD-10-CM | POA: Insufficient documentation

## 2014-08-18 DIAGNOSIS — A5609 Other chlamydial infection of lower genitourinary tract: Secondary | ICD-10-CM

## 2014-08-18 DIAGNOSIS — J069 Acute upper respiratory infection, unspecified: Secondary | ICD-10-CM | POA: Insufficient documentation

## 2014-08-18 DIAGNOSIS — F419 Anxiety disorder, unspecified: Secondary | ICD-10-CM | POA: Insufficient documentation

## 2014-08-18 DIAGNOSIS — Z862 Personal history of diseases of the blood and blood-forming organs and certain disorders involving the immune mechanism: Secondary | ICD-10-CM | POA: Insufficient documentation

## 2014-08-18 DIAGNOSIS — Z79899 Other long term (current) drug therapy: Secondary | ICD-10-CM | POA: Insufficient documentation

## 2014-08-18 DIAGNOSIS — R05 Cough: Secondary | ICD-10-CM | POA: Diagnosis present

## 2014-08-18 DIAGNOSIS — Z8669 Personal history of other diseases of the nervous system and sense organs: Secondary | ICD-10-CM | POA: Diagnosis not present

## 2014-08-18 LAB — GC/CHLAMYDIA PROBE AMP (~~LOC~~) NOT AT ARMC
CHLAMYDIA, DNA PROBE: POSITIVE — AB
Neisseria Gonorrhea: NEGATIVE

## 2014-08-18 LAB — HIV ANTIBODY (ROUTINE TESTING W REFLEX): HIV SCREEN 4TH GENERATION: NONREACTIVE

## 2014-08-18 MED ORDER — CEFIXIME 400 MG PO TABS: 400.0000 mg | ORAL_TABLET | Freq: Every day | ORAL | Status: DC

## 2014-08-18 MED ORDER — GUAIFENESIN 100 MG/5ML PO LIQD: 100.0000 mg | ORAL | Status: DC | PRN

## 2014-08-18 MED ORDER — AZITHROMYCIN 250 MG PO TABS: 1000.0000 mg | ORAL_TABLET | Freq: Once | ORAL | Status: DC

## 2014-08-18 NOTE — Discharge Instructions (Signed)
Upper Respiratory Infection, Adult An upper respiratory infection (URI) is also sometimes known as the common cold. The upper respiratory tract includes the nose, sinuses, throat, trachea, and bronchi. Bronchi are the airways leading to the lungs. Most people improve within 1 week, but symptoms can last up to 2 weeks. A residual cough may last even longer.  CAUSES Many different viruses can infect the tissues lining the upper respiratory tract. The tissues become irritated and inflamed and often become very moist. Mucus production is also common. A cold is contagious. You can easily spread the virus to others by oral contact. This includes kissing, sharing a glass, coughing, or sneezing. Touching your mouth or nose and then touching a surface, which is then touched by another person, can also spread the virus. SYMPTOMS  Symptoms typically develop 1 to 3 days after you come in contact with a cold virus. Symptoms vary from person to person. They may include:  Runny nose.  Sneezing.  Nasal congestion.  Sinus irritation.  Sore throat.  Loss of voice (laryngitis).  Cough.  Fatigue.  Muscle aches.  Loss of appetite.  Headache.  Low-grade fever. DIAGNOSIS  You might diagnose your own cold based on familiar symptoms, since most people get a cold 2 to 3 times a year. Your caregiver can confirm this based on your exam. Most importantly, your caregiver can check that your symptoms are not due to another disease such as strep throat, sinusitis, pneumonia, asthma, or epiglottitis. Blood tests, throat tests, and X-rays are not necessary to diagnose a common cold, but they may sometimes be helpful in excluding other more serious diseases. Your caregiver will decide if any further tests are required. RISKS AND COMPLICATIONS  You may be at risk for a more severe case of the common cold if you smoke cigarettes, have chronic heart disease (such as heart failure) or lung disease (such as asthma), or if  you have a weakened immune system. The very young and very old are also at risk for more serious infections. Bacterial sinusitis, middle ear infections, and bacterial pneumonia can complicate the common cold. The common cold can worsen asthma and chronic obstructive pulmonary disease (COPD). Sometimes, these complications can require emergency medical care and may be life-threatening. PREVENTION  The best way to protect against getting a cold is to practice good hygiene. Avoid oral or hand contact with people with cold symptoms. Wash your hands often if contact occurs. There is no clear evidence that vitamin C, vitamin E, echinacea, or exercise reduces the chance of developing a cold. However, it is always recommended to get plenty of rest and practice good nutrition. TREATMENT  Treatment is directed at relieving symptoms. There is no cure. Antibiotics are not effective, because the infection is caused by a virus, not by bacteria. Treatment may include:  Increased fluid intake. Sports drinks offer valuable electrolytes, sugars, and fluids.  Breathing heated mist or steam (vaporizer or shower).  Eating chicken soup or other clear broths, and maintaining good nutrition.  Getting plenty of rest.  Using gargles or lozenges for comfort.  Controlling fevers with ibuprofen or acetaminophen as directed by your caregiver.  Increasing usage of your inhaler if you have asthma. Zinc gel and zinc lozenges, taken in the first 24 hours of the common cold, can shorten the duration and lessen the severity of symptoms. Pain medicines may help with fever, muscle aches, and throat pain. A variety of non-prescription medicines are available to treat congestion and runny nose. Your caregiver   can make recommendations and may suggest nasal or lung inhalers for other symptoms.  HOME CARE INSTRUCTIONS   Only take over-the-counter or prescription medicines for pain, discomfort, or fever as directed by your  caregiver.  Use a warm mist humidifier or inhale steam from a shower to increase air moisture. This may keep secretions moist and make it easier to breathe.  Drink enough water and fluids to keep your urine clear or pale yellow.  Rest as needed.  Return to work when your temperature has returned to normal or as your caregiver advises. You may need to stay home longer to avoid infecting others. You can also use a face mask and careful hand washing to prevent spread of the virus. SEEK MEDICAL CARE IF:   After the first few days, you feel you are getting worse rather than better.  You need your caregiver's advice about medicines to control symptoms.  You develop chills, worsening shortness of breath, or brown or red sputum. These may be signs of pneumonia.  You develop yellow or brown nasal discharge or pain in the face, especially when you bend forward. These may be signs of sinusitis.  You develop a fever, swollen neck glands, pain with swallowing, or white areas in the back of your throat. These may be signs of strep throat. SEEK IMMEDIATE MEDICAL CARE IF:   You have a fever.  You develop severe or persistent headache, ear pain, sinus pain, or chest pain.  You develop wheezing, a prolonged cough, cough up blood, or have a change in your usual mucus (if you have chronic lung disease).  You develop sore muscles or a stiff neck. Document Released: 01/01/2001 Document Revised: 09/30/2011 Document Reviewed: 10/13/2013 Exit[employer] Patient Information 2015 [employer]. This information is not intended to replace advice given to you by your health care provider. Make sure you discuss any questions you have with your health care provider.  

## 2014-08-18 NOTE — ED Provider Notes (Signed)
CSN: [id number]     Arrival date & time 08/18/14  1236 History  This chart was scribed for non-physician practitioner Fayrene HelperBowie Destinie Thornsberry, working with Mirian MoMatthew Gentry, MD by Carl Bestelina Holson, ED Scribe. This patient was seen in room WTR8/WTR8 and the patient's care was started at 1:33 PM.   Chief Complaint  Patient presents with  . Cough    Patient is a 19 y.o. female presenting with cough. The history is provided by the patient. No language interpreter was used.  Cough Associated symptoms: shortness of breath   Associated symptoms: no chills, no fever and no rhinorrhea    HPI Comments: Leah [name] is a 19 y.o. female who presents to the Emergency Department complaining of constant, aching chest pain that started when she woke up this morning.  She experienced some abdominal pain yesterday.  Breathing aggravates the pain.  She become short of breath when she walks fast and has never experienced this before.  She denies lightheadedness, leg or calf swelling, dizziness, lightheadedness, rhinorrhea, fever, and chills as associated symptoms.  She uses Depo for birth control.  She has not had any recent surgery and has not been sitting in a car for a long period of time.  She has been in contact with her son who was experiencing rhinorrhea.   Past Medical History  Diagnosis Date  . Anemia 2013  . Anxiety 2011    no meds during pregnancy  . 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 Depression 03/16/2013    History of Depression. Review PP. Have patient RTC in 2 weeks   . Gall stones    Past Surgical History  Procedure Laterality Date  . Wisdom tooth extraction  2013  . Direct laryngoscopy N/A 04/24/2014    Procedure: DIRECT LARYNGOSCOPY;  Surgeon: Darletta MollSui W Teoh, MD;  [facility];  Service: ENT;  Laterality: N/A;  . Foreign body removal esophageal N/A 04/24/2014    Procedure: REMOVAL FOREIGN BODY ESOPHAGEAL;  Surgeon: Darletta MollSui W Teoh, MD;  [facility];  Service: ENT;  Laterality: N/A;    Family History  Problem Relation Age of Onset  . Arthritis Mother   . Bronchitis Mother   . Asthma Mother   . Hearing loss Paternal Grandfather   . Cancer Neg Hx   . Diabetes Neg Hx    History  Substance Use Topics  . Smoking status: Never Smoker   . Smokeless tobacco: Never Used  . Alcohol Use: No   OB History    Gravida Para Term Preterm AB TAB SAB Ectopic Multiple Living   1 1 1  0 0 0 0 0 0 1     Review of Systems  Constitutional: Negative for fever and chills.  HENT: Negative for rhinorrhea.   Respiratory: Positive for cough and shortness of breath.   Cardiovascular: Negative for leg swelling.  Gastrointestinal: Negative for abdominal pain.  Neurological: Negative for dizziness and light-headedness.  All other systems reviewed and are negative.     Allergies  Review of patient's allergies indicates no known allergies.  Home Medications   Prior to Admission medications   Medication Sig Start Date End Date Taking? Authorizing Provider  azithromycin (ZITHROMAX) 250 MG tablet Take 4 tablets (1,000 mg total) by mouth once. 08/18/14   Brock Badharles A Harper, MD  cefixime (SUPRAX) 400 MG tablet Take 1 tablet (400 mg total) by mouth daily. 08/18/14   Brock Badharles A Harper, MD  dicyclomine (BENTYL) 20 MG tablet Take 1  tablet (20 mg total) by mouth 2 (two) times daily as needed for spasms. 03/12/14   Loren Racer, MD  ibuprofen (ADVIL,MOTRIN) 600 MG tablet Take 600 mg by mouth every 6 (six) hours as needed for headache (headache).    Historical Provider, MD  polyethylene glycol (MIRALAX / GLYCOLAX) packet Take 17 g by mouth daily. Patient taking differently: Take 17 g by mouth daily as needed for mild constipation.  03/12/14   Loren Racer, MD  traMADol (ULTRAM) 50 MG tablet Take 1 tablet (50 mg total) by mouth every 6 (six) hours as needed. Patient taking differently: Take 50 mg by mouth every 6 (six) hours as needed for moderate pain or severe pain.  06/03/14   Gilda Crease, MD   Triage Vitals: BP 117/66 mmHg  Pulse 80  Temp(Src) 98 F (36.7 C) (Oral)  Resp 16  SpO2 100%  LMP 08/18/2014  Physical Exam  Constitutional: She is oriented to person, place, and time. She appears well-developed and well-nourished.  HENT:  Head: Normocephalic and atraumatic.  Right Ear: External ear normal.  Left Ear: External ear normal.  Mouth/Throat: Oropharynx is clear and moist. No oropharyngeal exudate.  Eyes: EOM are normal.  Neck: Normal range of motion.  Cardiovascular: Normal rate, regular rhythm and normal heart sounds.  Exam reveals no gallop and no friction rub.   No murmur heard. Pulmonary/Chest: Effort normal and breath sounds normal. No respiratory distress. She has no wheezes. She has no rales.  Musculoskeletal: Normal range of motion.  Neurological: She is alert and oriented to person, place, and time.  Skin: Skin is warm and dry.  Psychiatric: She has a normal mood and affect. Her behavior is normal.  Nursing note and vitals reviewed.   ED Course  Procedures (including critical care time)  DIAGNOSTIC STUDIES: Oxygen Saturation is 100% on room air, normal by my interpretation.    COORDINATION OF CARE: 1:36 PM- will order a chest x-ray to rule out pneumonia.  The patient agreed to the treatment plan.  1:42 PM PERC negative, doubt PE.  CXR neg for pna.  Reassurance given.  Likely viral etiology.  Cough medication prescribed.     Labs Review Labs Reviewed - No data to display  Imaging Review Dg Chest 2 View  08/18/2014   CLINICAL DATA:  Acute chest pain, nonproductive cough.  EXAM: CHEST  2 VIEW  COMPARISON:  March 12, 2014.  FINDINGS: The heart size and mediastinal contours are within normal limits. Both lungs are clear. No pneumothorax or pleural effusion is noted. The visualized skeletal structures are unremarkable.  IMPRESSION: No acute cardiopulmonary abnormality seen.   Electronically Signed   By: Roque Lias M.D.   On: 08/18/2014  13:36     EKG Interpretation None      MDM   Final diagnoses:  URI (upper respiratory infection)    BP 117/66 mmHg  Pulse 80  Temp(Src) 98 F (36.7 C) (Oral)  Resp 16  SpO2 100%  LMP 08/18/2014  I have reviewed nursing notes and vital signs. I personally reviewed the imaging tests through PACS system  I reviewed available ER/hospitalization records thought the EMR  I personally performed the services described in this documentation, which was scribed in my presence. The recorded information has been reviewed and is accurate.     Fayrene Helper, PA-C 08/18/14 1343  Mirian Mo, MD 08/19/14 [phone]

## 2014-08-18 NOTE — ED Notes (Signed)
Pt states woke up this morning with cough.  Pt states discomfort with breathing.  Throat drainage.  Pt was find before bed last night.  No fever. No vomiting.

## 2014-09-20 ENCOUNTER — Ambulatory Visit (INDEPENDENT_AMBULATORY_CARE_PROVIDER_SITE_OTHER): Payer: Self-pay | Admitting: General Surgery

## 2014-09-20 NOTE — H&P (Signed)
History of Present Illness Leah [name](Claudina Oliphant MD; 08/19/2014 10:09 AM) Patient words: gallstones.  The patient is a 19 year old female who presents for evaluation of gall stones. The patient is a 19 year old female with history of right upper quadrant pain. Patient underwent ultrasound back in August 2015 which revealed multiple gallstones. Patient states the pain usually is after meals. This is worse after spicy or fatty meals.   Other Problems Gilmer Mor(Sonya Bynum, CMA; 08/19/2014 9:56 AM) Anxiety Disorder Back Pain Chest pain Cholelithiasis Depression Gastroesophageal Reflux Disease Seizure Disorder  Past Surgical History Gilmer Mor(Sonya Bynum, CMA; 08/19/2014 9:56 AM) Oral Surgery  Diagnostic Studies History Gilmer Mor(Sonya Bynum, CMA; 08/19/2014 9:56 AM) Colonoscopy never Mammogram never Pap Smear 1-5 years ago  Allergies Lamar Laundry(Sonya Bynum, CMA; 08/19/2014 9:57 AM) No Known Drug Allergies01/29/2016  Medication History Gilmer Mor(Sonya Bynum, CMA; 08/19/2014 9:58 AM) No Current Medications  Social History Gilmer Mor(Sonya Bynum, CMA; 08/19/2014 9:56 AM) Caffeine use Coffee, Tea. No alcohol use No drug use Tobacco use Never smoker.  Family History Gilmer Mor(Sonya Bynum, CMA; 08/19/2014 9:56 AM) Alcohol Abuse Father. Anesthetic complications Family Members In General. Arthritis Mother. Bleeding disorder Family Members In General. Breast Cancer Family Members In General. Cancer Family Members In General. Cerebrovascular Accident Family Members In General. Cervical Cancer Family Members In General. Colon Cancer Family Members In General. Colon Polyps Family Members In General. Depression Family Members In General. Diabetes Mellitus Family Members In General. Heart Disease Family Members In General. Hypertension Family Members In General. Ischemic Bowel Disease Family Members In General. Kidney Disease Family Members In General. Malignant Neoplasm Of Pancreas Family Members In General. Melanoma  Family Members In General. Migraine Headache Mother. Ovarian Cancer Family Members In General. Prostate Cancer Family Members In General. Rectal Cancer Family Members In General. Respiratory Condition Family Members In General. Seizure disorder Family Members In General. Thyroid problems Family Members In General.  Pregnancy / Birth History Gilmer Mor(Sonya Bynum, CMA; 08/19/2014 9:56 AM) Age at menarche 12 years. Contraceptive History Depo-provera, Oral contraceptives. Gravida 1 Irregular periods Maternal age 20-20 Para 1  Review of Systems Lamar Laundry(Sonya Bynum CMA; 08/19/2014 9:56 AM) General Present- Appetite Loss, Fatigue, Night Sweats and Weight Loss. Not Present- Chills, Fever and Weight Gain. Skin Not Present- Change in Wart/Mole, Dryness, Hives, Jaundice, New Lesions, Non-Healing Wounds, Rash and Ulcer. HEENT Present- Wears glasses/contact lenses. Not Present- Earache, Hearing Loss, Hoarseness, Nose Bleed, Oral Ulcers, Ringing in the Ears, Seasonal Allergies, Sinus Pain, Sore Throat, Visual Disturbances and Yellow Eyes. Respiratory Present- Difficulty Breathing. Not Present- Bloody sputum, Chronic Cough, Snoring and Wheezing. Breast Present- Nipple Discharge. Not Present- Breast Mass, Breast Pain and Skin Changes. Cardiovascular Present- Chest Pain. Not Present- Difficulty Breathing Lying Down, Leg Cramps, Palpitations, Rapid Heart Rate, Shortness of Breath and Swelling of Extremities. Gastrointestinal Present- Abdominal Pain, Bloating, Bloody Stool, Constipation, Excessive gas, Gets full quickly at meals, Nausea and Rectal Pain. Not Present- Change in Bowel Habits, Chronic diarrhea, Difficulty Swallowing, Hemorrhoids, Indigestion and Vomiting. Female Genitourinary Present- Frequency and Urgency. Not Present- Nocturia, Painful Urination and Pelvic Pain. Musculoskeletal Present- Back Pain. Not Present- Joint Pain, Joint Stiffness, Muscle Pain, Muscle Weakness and Swelling of  Extremities. Neurological Present- Headaches, Tingling, Tremor and Weakness. Not Present- Decreased Memory, Fainting, Numbness, Seizures and Trouble walking. Psychiatric Present- Anxiety. Not Present- Bipolar, Change in Sleep Pattern, Depression, Fearful and Frequent crying. Endocrine Not Present- Cold Intolerance, Excessive Hunger, Hair Changes, Heat Intolerance, Hot flashes and New Diabetes. Hematology Present- Easy Bruising. Not Present- Excessive bleeding, Gland problems, HIV and Persistent Infections.  Vitals (Sonya Bynum CMA; 08/19/2014 9:57 AM) 08/19/2014 9:57 AM Weight: 93 lb Height: 60in Body Surface Area: 1.34 m Body Mass Index: 18.16 kg/m Temp.: 9F(Temporal)  Pulse: 76 (Regular)  BP: 118/70 (Sitting, Left Arm, Standard)    Physical Exam Leah Filler MD; 08/19/2014 10:08 AM) General Mental Status-Alert. General Appearance-Consistent with stated age. Hydration-Well hydrated. Voice-Normal.  Head and Neck Head-normocephalic, atraumatic with no lesions or palpable masses.  Chest and Lung Exam Chest and lung exam reveals -quiet, even and easy respiratory effort with no use of accessory muscles and on auscultation, normal breath sounds, no adventitious sounds and normal vocal resonance. Inspection Chest Wall - Normal. Back - normal.  Cardiovascular Cardiovascular examination reveals -on palpation PMI is normal in location and amplitude, no palpable S3 or S4. Normal cardiac borders., normal heart sounds, regular rate and rhythm with no murmurs, carotid auscultation reveals no bruits and normal pedal pulses bilaterally.  Abdomen Inspection Normal Exam - No Hernias. Skin - Scar - no surgical scars. Palpation/Percussion Normal exam - Soft, Non Tender, No Rebound tenderness, No Rigidity (guarding) and No hepatosplenomegaly. Auscultation Normal exam - Bowel sounds normal.  Neurologic Neurologic evaluation reveals -alert and oriented x 3 with  no impairment of recent or remote memory. Mental Status-Normal.  Musculoskeletal Normal Exam - Left-Upper Extremity Strength Normal and Lower Extremity Strength Normal. Normal Exam - Right-Upper Extremity Strength Normal, Lower Extremity Weakness.    Assessment & Plan Leah Filler MD; 08/19/2014 10:10 AM) SYMPTOMATIC CHOLELITHIASIS (574.20  K80.20) Impression: 19 year old female some choledocholithiasis  1. Will proceed to the operating room for laparoscopic cholecystectomy. 2. Risks and benefits were discussed with the patient to generally include, but not limited to: infection, bleeding, possible need for post op ERCP, damage to the bile ducts, bile leak, and possible need for further surgery. Alternatives were offered and described. All questions were answered and the patient voiced understanding of the procedure and wishes to proceed at this point with a laparoscopic cholecystectomy

## 2014-09-21 ENCOUNTER — Encounter (HOSPITAL_COMMUNITY): Payer: Self-pay

## 2014-09-21 ENCOUNTER — Encounter (HOSPITAL_COMMUNITY)
Admission: RE | Admit: 2014-09-21 | Discharge: 2014-09-21 | Disposition: A | Discharge status: Home / Self Care | Payer: Medicaid Other | Source: Ambulatory Visit | Attending: General Surgery | Admitting: General Surgery

## 2014-09-21 DIAGNOSIS — Z01812 Encounter for preprocedural laboratory examination: Secondary | ICD-10-CM | POA: Insufficient documentation

## 2014-09-21 DIAGNOSIS — K8020 Calculus of gallbladder without cholecystitis without obstruction: Secondary | ICD-10-CM | POA: Insufficient documentation

## 2014-09-21 DIAGNOSIS — Z01818 Encounter for other preprocedural examination: Secondary | ICD-10-CM | POA: Insufficient documentation

## 2014-09-21 HISTORY — DX: Gastro-esophageal reflux disease without esophagitis: K21.9

## 2014-09-21 LAB — CBC
HEMATOCRIT: 37.9 % (ref 36.0–46.0)
HEMOGLOBIN: 12.7 g/dL (ref 12.0–15.0)
MCH: 30.1 pg (ref 26.0–34.0)
MCHC: 33.5 g/dL (ref 30.0–36.0)
MCV: 89.8 fL (ref 78.0–100.0)
Platelets: 209 10*3/uL (ref 150–400)
RBC: 4.22 MIL/uL (ref 3.87–5.11)
RDW: 12.7 % (ref 11.5–15.5)
WBC: 5 10*3/uL (ref 4.0–10.5)

## 2014-09-21 LAB — HCG, SERUM, QUALITATIVE: Preg, Serum: NEGATIVE

## 2014-09-21 NOTE — Pre-Procedure Instructions (Signed)
Leah [name]  09/21/2014   Your procedure is scheduled on:  09-28-2014    Wednesday   [facility]  AM.   Call this number if you have problems the morning of surgery: [phone]   Remember:   Do not eat food or drink liquids after midnight.    Take these medicines the morning of surgery with A SIP OF WATER: zofran if needed,ranitidine(zantac).              Stop ibuprofen,advil aleve 5 days prior to surgery   Do not wear jewelry,   Do not wear lotions, powders, or perfumes. You may not wear deodorant.  Do not shave 48 hours prior to surgery.   Do not bring valuables to the hospital.  [facility] is not responsible for any belongings or valuables.               Contacts, dentures or bridgework may not be worn into surgery.   Leave suitcase in the car. After surgery it may be brought to your room.  For patients admitted to the hospital, discharge time is determined by your  treatment team.               Patients discharged the day of surgery will not be allowed to drive home.   Name and phone number of your driver:    Special Instructions: See attached sheet for instructions on CHG  Shower/bath     Please read over the following fact sheets that you were given: Pain Booklet, Coughing and Deep Breathing and Surgical Site Infection Prevention

## 2014-09-27 MED ORDER — CEFAZOLIN SODIUM-DEXTROSE 2-3 GM-% IV SOLR
2.0000 g | INTRAVENOUS | Status: DC
  Filled 2014-09-27: qty 50

## 2014-09-28 ENCOUNTER — Ambulatory Visit (HOSPITAL_COMMUNITY): Payer: Medicaid Other | Admitting: Certified Registered"

## 2014-09-28 ENCOUNTER — Encounter (HOSPITAL_COMMUNITY): Payer: Self-pay | Admitting: *Deleted

## 2014-09-28 ENCOUNTER — Ambulatory Visit (HOSPITAL_COMMUNITY)
Admission: RE | Admit: 2014-09-28 | Discharge: 2014-09-28 | Disposition: A | Discharge status: Home / Self Care | Payer: Medicaid Other | Source: Ambulatory Visit | Attending: General Surgery | Admitting: General Surgery

## 2014-09-28 ENCOUNTER — Encounter (HOSPITAL_COMMUNITY)
Admission: RE | Disposition: A | Discharge status: Home / Self Care | Payer: Self-pay | Source: Ambulatory Visit | Attending: General Surgery

## 2014-09-28 DIAGNOSIS — K8010 Calculus of gallbladder with chronic cholecystitis without obstruction: Secondary | ICD-10-CM | POA: Insufficient documentation

## 2014-09-28 DIAGNOSIS — K219 Gastro-esophageal reflux disease without esophagitis: Secondary | ICD-10-CM | POA: Diagnosis not present

## 2014-09-28 DIAGNOSIS — F329 Major depressive disorder, single episode, unspecified: Secondary | ICD-10-CM | POA: Diagnosis not present

## 2014-09-28 DIAGNOSIS — G40909 Epilepsy, unspecified, not intractable, without status epilepticus: Secondary | ICD-10-CM | POA: Insufficient documentation

## 2014-09-28 DIAGNOSIS — K8020 Calculus of gallbladder without cholecystitis without obstruction: Secondary | ICD-10-CM | POA: Diagnosis present

## 2014-09-28 HISTORY — PX: CHOLECYSTECTOMY: SHX55

## 2014-09-28 SURGERY — LAPAROSCOPIC CHOLECYSTECTOMY
Anesthesia: General | Site: Abdomen

## 2014-09-28 MED ORDER — ONDANSETRON HCL 4 MG/2ML IJ SOLN
INTRAMUSCULAR | Status: DC | PRN
  Administered 2014-09-28: 4 mg via INTRAVENOUS

## 2014-09-28 MED ORDER — ONDANSETRON HCL 4 MG/2ML IJ SOLN
INTRAMUSCULAR | Status: AC
  Filled 2014-09-28: qty 2

## 2014-09-28 MED ORDER — DEXAMETHASONE SODIUM PHOSPHATE 4 MG/ML IJ SOLN
INTRAMUSCULAR | Status: AC
  Filled 2014-09-28: qty 2

## 2014-09-28 MED ORDER — PROPOFOL 10 MG/ML IV BOLUS
INTRAVENOUS | Status: DC | PRN
  Administered 2014-09-28: 150 mg via INTRAVENOUS

## 2014-09-28 MED ORDER — SODIUM CHLORIDE 0.9 % IR SOLN
Status: DC | PRN
  Administered 2014-09-28: 1000 mL

## 2014-09-28 MED ORDER — ACETAMINOPHEN 325 MG PO TABS
650.0000 mg | ORAL_TABLET | ORAL | Status: DC | PRN
  Filled 2014-09-28: qty 2

## 2014-09-28 MED ORDER — SODIUM CHLORIDE 0.9 % IJ SOLN
INTRAMUSCULAR | Status: AC
  Filled 2014-09-28: qty 10

## 2014-09-28 MED ORDER — OXYCODONE HCL 5 MG PO TABS
ORAL_TABLET | ORAL | Status: AC
  Filled 2014-09-28: qty 1

## 2014-09-28 MED ORDER — BUPIVACAINE HCL (PF) 0.25 % IJ SOLN
INTRAMUSCULAR | Status: AC
  Filled 2014-09-28: qty 60

## 2014-09-28 MED ORDER — KETOROLAC TROMETHAMINE 30 MG/ML IJ SOLN
INTRAMUSCULAR | Status: AC
  Filled 2014-09-28: qty 1

## 2014-09-28 MED ORDER — HYDROMORPHONE HCL 1 MG/ML IJ SOLN
INTRAMUSCULAR | Status: AC
  Filled 2014-09-28: qty 1

## 2014-09-28 MED ORDER — PHENYLEPHRINE 40 MCG/ML (10ML) SYRINGE FOR IV PUSH (FOR BLOOD PRESSURE SUPPORT)
PREFILLED_SYRINGE | INTRAVENOUS | Status: AC
  Filled 2014-09-28: qty 10

## 2014-09-28 MED ORDER — SODIUM CHLORIDE 0.9 % IJ SOLN: 3.0000 mL | INTRAMUSCULAR | Status: DC | PRN

## 2014-09-28 MED ORDER — GLYCOPYRROLATE 0.2 MG/ML IJ SOLN
INTRAMUSCULAR | Status: DC | PRN
  Administered 2014-09-28: 0.4 mg via INTRAVENOUS

## 2014-09-28 MED ORDER — BUPIVACAINE HCL 0.25 % IJ SOLN
INTRAMUSCULAR | Status: DC | PRN
  Administered 2014-09-28: 3 mL

## 2014-09-28 MED ORDER — LIDOCAINE HCL (CARDIAC) 20 MG/ML IV SOLN
INTRAVENOUS | Status: AC
  Filled 2014-09-28: qty 5

## 2014-09-28 MED ORDER — SODIUM CHLORIDE 0.9 % IJ SOLN
3.0000 mL | Freq: Two times a day (BID) | INTRAMUSCULAR | Status: DC
Start: 2014-09-28 — End: 2014-09-28

## 2014-09-28 MED ORDER — CHLORHEXIDINE GLUCONATE 4 % EX LIQD: 1.0000 "application " | Freq: Once | CUTANEOUS | Status: DC

## 2014-09-28 MED ORDER — NEOSTIGMINE METHYLSULFATE 10 MG/10ML IV SOLN
INTRAVENOUS | Status: AC
  Filled 2014-09-28: qty 1

## 2014-09-28 MED ORDER — LACTATED RINGERS IV SOLN
INTRAVENOUS | Status: DC | PRN
  Administered 2014-09-28 (×2): via INTRAVENOUS

## 2014-09-28 MED ORDER — PROPOFOL 10 MG/ML IV BOLUS
INTRAVENOUS | Status: AC
  Filled 2014-09-28: qty 20

## 2014-09-28 MED ORDER — FENTANYL CITRATE 0.05 MG/ML IJ SOLN
INTRAMUSCULAR | Status: AC
  Filled 2014-09-28: qty 5

## 2014-09-28 MED ORDER — HYDROMORPHONE HCL 1 MG/ML IJ SOLN
0.2500 mg | INTRAMUSCULAR | Status: DC | PRN
  Administered 2014-09-28 (×4): 0.5 mg via INTRAVENOUS

## 2014-09-28 MED ORDER — ROCURONIUM BROMIDE 100 MG/10ML IV SOLN
INTRAVENOUS | Status: DC | PRN
  Administered 2014-09-28: 30 mg via INTRAVENOUS

## 2014-09-28 MED ORDER — PROMETHAZINE HCL 25 MG/ML IJ SOLN
6.2500 mg | INTRAMUSCULAR | Status: DC | PRN
Start: 2014-09-28 — End: 2014-09-28
  Administered 2014-09-28: 6.25 mg via INTRAVENOUS

## 2014-09-28 MED ORDER — ROCURONIUM BROMIDE 50 MG/5ML IV SOLN
INTRAVENOUS | Status: AC
  Filled 2014-09-28: qty 1

## 2014-09-28 MED ORDER — FENTANYL CITRATE 0.05 MG/ML IJ SOLN
INTRAMUSCULAR | Status: DC | PRN
  Administered 2014-09-28: 100 ug via INTRAVENOUS

## 2014-09-28 MED ORDER — OXYCODONE HCL 5 MG PO TABS
5.0000 mg | ORAL_TABLET | ORAL | Status: DC | PRN
  Administered 2014-09-28: 5 mg via ORAL

## 2014-09-28 MED ORDER — SODIUM CHLORIDE 0.9 % IV SOLN: 250.0000 mL | INTRAVENOUS | Status: DC | PRN

## 2014-09-28 MED ORDER — ACETAMINOPHEN 10 MG/ML IV SOLN
1000.0000 mg | Freq: Four times a day (QID) | INTRAVENOUS | Status: DC
  Administered 2014-09-28: 1000 mg via INTRAVENOUS

## 2014-09-28 MED ORDER — GLYCOPYRROLATE 0.2 MG/ML IJ SOLN
INTRAMUSCULAR | Status: AC
  Filled 2014-09-28: qty 2

## 2014-09-28 MED ORDER — ACETAMINOPHEN 10 MG/ML IV SOLN
INTRAVENOUS | Status: AC
  Filled 2014-09-28: qty 100

## 2014-09-28 MED ORDER — DEXAMETHASONE SODIUM PHOSPHATE 4 MG/ML IJ SOLN
INTRAMUSCULAR | Status: DC | PRN
  Administered 2014-09-28: 8 mg via INTRAVENOUS

## 2014-09-28 MED ORDER — OXYCODONE-ACETAMINOPHEN 5-325 MG PO TABS: 1.0000 | ORAL_TABLET | ORAL | Status: DC | PRN

## 2014-09-28 MED ORDER — LIDOCAINE HCL (CARDIAC) 20 MG/ML IV SOLN
INTRAVENOUS | Status: DC | PRN
  Administered 2014-09-28: 80 mg via INTRAVENOUS

## 2014-09-28 MED ORDER — MIDAZOLAM HCL 2 MG/2ML IJ SOLN
INTRAMUSCULAR | Status: AC
  Filled 2014-09-28: qty 2

## 2014-09-28 MED ORDER — NEOSTIGMINE METHYLSULFATE 10 MG/10ML IV SOLN
INTRAVENOUS | Status: DC | PRN
  Administered 2014-09-28: 3 mg via INTRAVENOUS
  Administered 2014-09-28: 1 mg via INTRAVENOUS

## 2014-09-28 MED ORDER — EPHEDRINE SULFATE 50 MG/ML IJ SOLN
INTRAMUSCULAR | Status: AC
  Filled 2014-09-28: qty 1

## 2014-09-28 MED ORDER — PROMETHAZINE HCL 25 MG/ML IJ SOLN
INTRAMUSCULAR | Status: AC
  Filled 2014-09-28: qty 1

## 2014-09-28 MED ORDER — ACETAMINOPHEN 650 MG RE SUPP
650.0000 mg | RECTAL | Status: DC | PRN
  Filled 2014-09-28: qty 1

## 2014-09-28 MED ORDER — KETOROLAC TROMETHAMINE 30 MG/ML IJ SOLN
30.0000 mg | Freq: Once | INTRAMUSCULAR | Status: AC | PRN
  Administered 2014-09-28: 30 mg via INTRAVENOUS

## 2014-09-28 MED ORDER — MIDAZOLAM HCL 5 MG/5ML IJ SOLN
INTRAMUSCULAR | Status: DC | PRN
  Administered 2014-09-28: 2 mg via INTRAVENOUS

## 2014-09-28 MED ORDER — ACETAMINOPHEN 325 MG PO TABS
ORAL_TABLET | ORAL | Status: AC
  Filled 2014-09-28: qty 2

## 2014-09-28 MED ORDER — SUCCINYLCHOLINE CHLORIDE 20 MG/ML IJ SOLN
INTRAMUSCULAR | Status: AC
  Filled 2014-09-28: qty 1

## 2014-09-28 MED ORDER — KETOROLAC TROMETHAMINE 30 MG/ML IJ SOLN
INTRAMUSCULAR | Status: AC
  Administered 2014-09-28: 30 mg
  Filled 2014-09-28: qty 1

## 2014-09-28 SURGICAL SUPPLY — 43 items
BENZOIN TINCTURE PRP APPL 2/3 (GAUZE/BANDAGES/DRESSINGS) ×2 IMPLANT
CANISTER SUCTION 2500CC (MISCELLANEOUS) ×2 IMPLANT
CHLORAPREP W/TINT 26ML (MISCELLANEOUS) ×2 IMPLANT
CLIP LIGATING HEMO O LOK GREEN (MISCELLANEOUS) ×4 IMPLANT
COVER MAYO STAND STRL (DRAPES) IMPLANT
COVER SURGICAL LIGHT HANDLE (MISCELLANEOUS) ×2 IMPLANT
COVER TRANSDUCER ULTRASND (DRAPES) ×2 IMPLANT
DEVICE TROCAR PUNCTURE CLOSURE (ENDOMECHANICALS) ×2 IMPLANT
DRAPE C-ARM 42X72 X-RAY (DRAPES) IMPLANT
DRAPE LAPAROSCOPIC ABDOMINAL (DRAPES) ×2 IMPLANT
ELECT REM PT RETURN 9FT ADLT (ELECTROSURGICAL) ×2
ELECTRODE REM PT RTRN 9FT ADLT (ELECTROSURGICAL) ×1 IMPLANT
GAUZE SPONGE 2X2 8PLY STRL LF (GAUZE/BANDAGES/DRESSINGS) ×1 IMPLANT
GLOVE BIO SURGEON STRL SZ 6.5 (GLOVE) ×2 IMPLANT
GLOVE BIO SURGEON STRL SZ7.5 (GLOVE) ×2 IMPLANT
GLOVE BIOGEL PI IND STRL 7.0 (GLOVE) ×1 IMPLANT
GLOVE BIOGEL PI INDICATOR 7.0 (GLOVE) ×1
GOWN STRL REUS W/ TWL LRG LVL3 (GOWN DISPOSABLE) ×2 IMPLANT
GOWN STRL REUS W/ TWL XL LVL3 (GOWN DISPOSABLE) ×1 IMPLANT
GOWN STRL REUS W/TWL LRG LVL3 (GOWN DISPOSABLE) ×2
GOWN STRL REUS W/TWL XL LVL3 (GOWN DISPOSABLE) ×1
IV CATH 14GX2 1/4 (CATHETERS) IMPLANT
KIT BASIN OR (CUSTOM PROCEDURE TRAY) ×2 IMPLANT
KIT ROOM TURNOVER OR (KITS) ×2 IMPLANT
NEEDLE INSUFFLATION 14GA 120MM (NEEDLE) ×2 IMPLANT
NS IRRIG 1000ML POUR BTL (IV SOLUTION) ×2 IMPLANT
PAD ARMBOARD 7.5X6 YLW CONV (MISCELLANEOUS) ×4 IMPLANT
POUCH SPECIMEN RETRIEVAL 10MM (ENDOMECHANICALS) IMPLANT
SCISSORS LAP 5X35 DISP (ENDOMECHANICALS) ×2 IMPLANT
SET CHOLANGIOGRAPHY FRANKLIN (SET/KITS/TRAYS/PACK) IMPLANT
SET IRRIG TUBING LAPAROSCOPIC (IRRIGATION / IRRIGATOR) ×2 IMPLANT
SLEEVE ENDOPATH XCEL 5M (ENDOMECHANICALS) ×2 IMPLANT
SPECIMEN JAR SMALL (MISCELLANEOUS) ×2 IMPLANT
SPONGE GAUZE 2X2 STER 10/PKG (GAUZE/BANDAGES/DRESSINGS) ×1
STRIP CLOSURE SKIN 1/2X4 (GAUZE/BANDAGES/DRESSINGS) ×2 IMPLANT
SUT MNCRL AB 3-0 PS2 18 (SUTURE) ×2 IMPLANT
TAPE CLOTH SURG 4X10 WHT LF (GAUZE/BANDAGES/DRESSINGS) ×2 IMPLANT
TOWEL OR 17X24 6PK STRL BLUE (TOWEL DISPOSABLE) ×2 IMPLANT
TOWEL OR 17X26 10 PK STRL BLUE (TOWEL DISPOSABLE) ×2 IMPLANT
TRAY LAPAROSCOPIC (CUSTOM PROCEDURE TRAY) ×2 IMPLANT
TROCAR XCEL NON-BLD 11X100MML (ENDOMECHANICALS) ×2 IMPLANT
TROCAR XCEL NON-BLD 5MMX100MML (ENDOMECHANICALS) ×2 IMPLANT
TUBING INSUFFLATION (TUBING) ×2 IMPLANT

## 2014-09-28 NOTE — H&P (View-Only) (Signed)
History of Present Illness (Leah Soucek MD; 08/19/2014 10:09 AM) Patient words: gallstones.  The patient is a 18 year old female who presents for evaluation of gall stones. The patient is a 18-year-old female with history of right upper quadrant pain. Patient underwent ultrasound back in August 2015 which revealed multiple gallstones. Patient states the pain usually is after meals. This is worse after spicy or fatty meals.   Other Problems (Sonya Bynum, CMA; 08/19/2014 9:56 AM) Anxiety Disorder Back Pain Chest pain Cholelithiasis Depression Gastroesophageal Reflux Disease Seizure Disorder  Past Surgical History (Sonya Bynum, CMA; 08/19/2014 9:56 AM) Oral Surgery  Diagnostic Studies History (Sonya Bynum, CMA; 08/19/2014 9:56 AM) Colonoscopy never Mammogram never Pap Smear 1-5 years ago  Allergies (Sonya Bynum, CMA; 08/19/2014 9:57 AM) No Known Drug Allergies01/29/2016  Medication History (Sonya Bynum, CMA; 08/19/2014 9:58 AM) No Current Medications  Social History (Sonya Bynum, CMA; 08/19/2014 9:56 AM) Caffeine use Coffee, Tea. No alcohol use No drug use Tobacco use Never smoker.  Family History (Sonya Bynum, CMA; 08/19/2014 9:56 AM) Alcohol Abuse Father. Anesthetic complications Family Members In General. Arthritis Mother. Bleeding disorder Family Members In General. Breast Cancer Family Members In General. Cancer Family Members In General. Cerebrovascular Accident Family Members In General. Cervical Cancer Family Members In General. Colon Cancer Family Members In General. Colon Polyps Family Members In General. Depression Family Members In General. Diabetes Mellitus Family Members In General. Heart Disease Family Members In General. Hypertension Family Members In General. Ischemic Bowel Disease Family Members In General. Kidney Disease Family Members In General. Malignant Neoplasm Of Pancreas Family Members In General. Melanoma  Family Members In General. Migraine Headache Mother. Ovarian Cancer Family Members In General. Prostate Cancer Family Members In General. Rectal Cancer Family Members In General. Respiratory Condition Family Members In General. Seizure disorder Family Members In General. Thyroid problems Family Members In General.  Pregnancy / Birth History (Sonya Bynum, CMA; 08/19/2014 9:56 AM) Age at menarche 12 years. Contraceptive History Depo-provera, Oral contraceptives. Gravida 1 Irregular periods Maternal age 15-20 Para 1  Review of Systems (Sonya Bynum CMA; 08/19/2014 9:56 AM) General Present- Appetite Loss, Fatigue, Night Sweats and Weight Loss. Not Present- Chills, Fever and Weight Gain. Skin Not Present- Change in Wart/Mole, Dryness, Hives, Jaundice, New Lesions, Non-Healing Wounds, Rash and Ulcer. HEENT Present- Wears glasses/contact lenses. Not Present- Earache, Hearing Loss, Hoarseness, Nose Bleed, Oral Ulcers, Ringing in the Ears, Seasonal Allergies, Sinus Pain, Sore Throat, Visual Disturbances and Yellow Eyes. Respiratory Present- Difficulty Breathing. Not Present- Bloody sputum, Chronic Cough, Snoring and Wheezing. Breast Present- Nipple Discharge. Not Present- Breast Mass, Breast Pain and Skin Changes. Cardiovascular Present- Chest Pain. Not Present- Difficulty Breathing Lying Down, Leg Cramps, Palpitations, Rapid Heart Rate, Shortness of Breath and Swelling of Extremities. Gastrointestinal Present- Abdominal Pain, Bloating, Bloody Stool, Constipation, Excessive gas, Gets full quickly at meals, Nausea and Rectal Pain. Not Present- Change in Bowel Habits, Chronic diarrhea, Difficulty Swallowing, Hemorrhoids, Indigestion and Vomiting. Female Genitourinary Present- Frequency and Urgency. Not Present- Nocturia, Painful Urination and Pelvic Pain. Musculoskeletal Present- Back Pain. Not Present- Joint Pain, Joint Stiffness, Muscle Pain, Muscle Weakness and Swelling of  Extremities. Neurological Present- Headaches, Tingling, Tremor and Weakness. Not Present- Decreased Memory, Fainting, Numbness, Seizures and Trouble walking. Psychiatric Present- Anxiety. Not Present- Bipolar, Change in Sleep Pattern, Depression, Fearful and Frequent crying. Endocrine Not Present- Cold Intolerance, Excessive Hunger, Hair Changes, Heat Intolerance, Hot flashes and New Diabetes. Hematology Present- Easy Bruising. Not Present- Excessive bleeding, Gland problems, HIV and Persistent Infections.     Vitals (Sonya Bynum CMA; 08/19/2014 9:57 AM) 08/19/2014 9:57 AM Weight: 93 lb Height: 60in Body Surface Area: 1.34 m Body Mass Index: 18.16 kg/m Temp.: 99F(Temporal)  Pulse: 76 (Regular)  BP: 118/70 (Sitting, Left Arm, Standard)    Physical Exam (Leah Villagomez MD; 08/19/2014 10:08 AM) General Mental Status-Alert. General Appearance-Consistent with stated age. Hydration-Well hydrated. Voice-Normal.  Head and Neck Head-normocephalic, atraumatic with no lesions or palpable masses.  Chest and Lung Exam Chest and lung exam reveals -quiet, even and easy respiratory effort with no use of accessory muscles and on auscultation, normal breath sounds, no adventitious sounds and normal vocal resonance. Inspection Chest Wall - Normal. Back - normal.  Cardiovascular Cardiovascular examination reveals -on palpation PMI is normal in location and amplitude, no palpable S3 or S4. Normal cardiac borders., normal heart sounds, regular rate and rhythm with no murmurs, carotid auscultation reveals no bruits and normal pedal pulses bilaterally.  Abdomen Inspection Normal Exam - No Hernias. Skin - Scar - no surgical scars. Palpation/Percussion Normal exam - Soft, Non Tender, No Rebound tenderness, No Rigidity (guarding) and No hepatosplenomegaly. Auscultation Normal exam - Bowel sounds normal.  Neurologic Neurologic evaluation reveals -alert and oriented x 3 with  no impairment of recent or remote memory. Mental Status-Normal.  Musculoskeletal Normal Exam - Left-Upper Extremity Strength Normal and Lower Extremity Strength Normal. Normal Exam - Right-Upper Extremity Strength Normal, Lower Extremity Weakness.    Assessment & Plan (Leah Sternberg MD; 08/19/2014 10:10 AM) SYMPTOMATIC CHOLELITHIASIS (574.20  K80.20) Impression: 18-year-old female some choledocholithiasis  1. Will proceed to the operating room for laparoscopic cholecystectomy. 2. Risks and benefits were discussed with the patient to generally include, but not limited to: infection, bleeding, possible need for post op ERCP, damage to the bile ducts, bile leak, and possible need for further surgery. Alternatives were offered and described. All questions were answered and the patient voiced understanding of the procedure and wishes to proceed at this point with a laparoscopic cholecystectomy 

## 2014-09-28 NOTE — Progress Notes (Signed)
C/o nausea, phenergan 6.25 mg given. Tylenol put back till able to take oral,

## 2014-09-28 NOTE — Op Note (Signed)
09/28/2014  9:10 AM  PATIENT:  Leah [name]  19 y.o. female  PRE-OPERATIVE DIAGNOSIS:  Gallstones  POST-OPERATIVE DIAGNOSIS:  Gallstones  PROCEDURE:  Procedure(s): LAPAROSCOPIC CHOLECYSTECTOMY (N/A)  SURGEON:  Surgeon(s) and Role:    * Axel FillerArmando Xenia Nile, MD - Primary  ANESTHESIA:   local and general  EBL:   min  BLOOD ADMINISTERED:none  DRAINS: none   LOCAL MEDICATIONS USED:  BUPIVICAINE   SPECIMEN:  Source of Specimen:  gallbladder  DISPOSITION OF SPECIMEN:  PATHOLOGY  COUNTS:  YES  TOURNIQUET:  * No tourniquets in log *  DICTATION: .Dragon Dictation  Findings:Chronically inflammed gallbladder  Indications for procedure: Pt is a 19 y/o F with RUQ pain and seen to have gallstones.   Details of the procedure: The patient was taken to the operating and placed in the supine position with bilateral SCDs in place. A time out was called and all facts were verified. A pneumoperitoneum was obtained via A Veress needle technique to a pressure of 14mm of mercury. A 5mm trochar was then placed in the right upper quadrant under visualization, and there were no injuries to any abdominal organs. A 11 mm port was then placed in the umbilical region after infiltrating with local anesthesia under direct visualization. A second epigastric port was placed under direct visualization. The gallbladder was identified and retracted, the peritoneum was then sharply dissected from the gallbladder and this dissection was carried down to Calot's triangle. The cystic duct was identified and stripped away circumferentially and seen going into the gallbladder 360, the critical angle was obtained. 2 clips were placed proximally one distally and the cystic duct transected. The cystic artery was identified and 2 clips placed proximally and one distally and transected. We then proceeded to remove the gallbladder off the hepatic fossa with Bovie cautery. A retrieval bag was then placed in the abdomen and  gallbladder placed in the bag. The hepatic fossa was then reexamined and hemostasis was achieved with Bovie cautery and was excellent at this portion of the case. The subhepatic fossa and perihepatic fossa was then irrigated until the effluent was clear.  The bag was then removed from the abdominal cavity. The 11 mm trocar was removed and the fascia was reapproximated with the Endo Close #1 Vicryl x1. The pneumoperitoneum was evacuated and all trochars removed under direct visulalization. The skin was then closed with 4-0 Monocryl and the skin dressed with Steri-Strips, gauze, and tape. The patient was awaken from general anesthesia and taken to the recovery room in stable condition.    PLAN OF CARE: Discharge to home after PACU  PATIENT DISPOSITION:  PACU - hemodynamically stable.   Delay start of Pharmacological VTE agent (>24hrs) due to surgical blood loss or risk of bleeding: not applicable

## 2014-09-28 NOTE — Interval H&P Note (Signed)
History and Physical Interval Note:  09/28/2014 7:17 AM  [place] Stark [name] Schutt  has presented today for surgery, with the diagnosis of Gallstones  The various methods of treatment have been discussed with the patient and family. After consideration of risks, benefits and other options for treatment, the patient has consented to  Procedure(s): LAPAROSCOPIC CHOLECYSTECTOMY (N/A) as a surgical intervention .  The patient's history has been reviewed, patient examined, no change in status, stable for surgery.  I have reviewed the patient's chart and labs.  Questions were answered to the patient's satisfaction.     Marigene Ehlersamirez Jr., Jed LimerickArmando

## 2014-09-28 NOTE — Anesthesia Preprocedure Evaluation (Signed)
Anesthesia Evaluation  Patient identified by MRN, date of birth, ID band Patient awake    Reviewed: Allergy & Precautions, NPO status , Patient's Chart, lab work & pertinent test results  Airway Mallampati: II  TM Distance: >3 FB Neck ROM: Full    Dental no notable dental hx.    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      GI/Hepatic negative GI ROS, Neg liver ROS,   Endo/Other  negative endocrine ROS  Renal/GU negative Renal ROS  negative genitourinary   Musculoskeletal negative musculoskeletal ROS (+)   Abdominal   Peds negative pediatric ROS (+)  Hematology negative hematology ROS (+)   Anesthesia Other Findings   Reproductive/Obstetrics negative OB ROS                             Anesthesia Physical Anesthesia Plan  ASA: II  Anesthesia Plan: General   Post-op Pain Management:    Induction: Intravenous  Airway Management Planned: Oral ETT  Additional Equipment:   Intra-op Plan:   Post-operative Plan: Extubation in OR  Informed Consent: I have reviewed the patients History and Physical, chart, labs and discussed the procedure including the risks, benefits and alternatives for the proposed anesthesia with the patient or authorized representative who has indicated his/her understanding and acceptance.   Dental advisory given  Plan Discussed with: CRNA and Surgeon  Anesthesia Plan Comments:         Anesthesia Quick Evaluation

## 2014-09-28 NOTE — Anesthesia Procedure Notes (Signed)
Procedure Name: Intubation Date/Time: 09/28/2014 8:31 AM Performed by: Jerilee HohMUMM, Angelic Schnelle N Pre-anesthesia Checklist: Patient identified, Emergency Drugs available, Suction available and Patient being monitored Patient Re-evaluated:Patient Re-evaluated prior to inductionOxygen Delivery Method: Circle system utilized Preoxygenation: Pre-oxygenation with 100% oxygen Intubation Type: IV induction Ventilation: Mask ventilation without difficulty Laryngoscope Size: 3 and Mac Grade View: Grade I Tube type: Oral Tube size: 7.0 mm Number of attempts: 1 Airway Equipment and Method: Stylet Placement Confirmation: ETT inserted through vocal cords under direct vision,  positive ETCO2 and breath sounds checked- equal and bilateral Secured at: 20 cm Tube secured with: Tape Dental Injury: Teeth and Oropharynx as per pre-operative assessment

## 2014-09-28 NOTE — Transfer of Care (Signed)
Immediate Anesthesia Transfer of Care Note  Patient: Leah [name]  Procedure(s) Performed: Procedure(s): LAPAROSCOPIC CHOLECYSTECTOMY (N/A)  Patient Location: PACU  Anesthesia Type:General  Level of Consciousness: awake, alert , oriented and patient cooperative  Airway & Oxygen Therapy: Patient Spontanous Breathing and Patient connected to nasal cannula oxygen  Post-op Assessment: Report given to RN, Post -op Vital signs reviewed and stable and Patient moving all extremities  Post vital signs: Reviewed and stable  Last Vitals:  Filed Vitals:   09/28/14 0612  BP: 114/72  Pulse: 87  Temp: 36.7 C  Resp: 20    Complications: No apparent anesthesia complications

## 2014-09-28 NOTE — Progress Notes (Signed)
Pt able to ambulate to restroom/ void, denies nausea, pain tolerable, pt would like to go home. IV removed, site dry and without redness. Abdomen dressings intact and has scant old drainage. DC papers reviewed with patient, script given. Pt will have boyfriend with her this evening. DC via w/c.

## 2014-09-28 NOTE — Anesthesia Postprocedure Evaluation (Signed)
  Anesthesia Post-op Note  Patient: Leah [name]  Procedure(s) Performed: Procedure(s) (LRB): LAPAROSCOPIC CHOLECYSTECTOMY (N/A)  Patient Location: PACU  Anesthesia Type: General  Level of Consciousness: awake and alert   Airway and Oxygen Therapy: Patient Spontanous Breathing  Post-op Pain: mild  Post-op Assessment: Post-op Vital signs reviewed, Patient's Cardiovascular Status Stable, Respiratory Function Stable, Patent Airway and No signs of Nausea or vomiting  Last Vitals:  Filed Vitals:   09/28/14 0952  BP: 122/76  Pulse: 72  Temp:   Resp: 28    Post-op Vital Signs: stable   Complications: No apparent anesthesia complications

## 2014-09-28 NOTE — Discharge Instructions (Signed)
[facility] ______[facility] °LAPAROSCOPIC SURGERY: POST OP INSTRUCTIONS °Always review your discharge instruction sheet given to you by the facility where your surgery was performed. °IF YOU HAVE DISABILITY OR FAMILY LEAVE FORMS, YOU MUST BRING THEM TO THE OFFICE FOR PROCESSING.   °DO NOT GIVE THEM TO YOUR DOCTOR. ° °1. A prescription for pain medication may be given to you upon discharge.  Take your pain medication as prescribed, if needed.  If narcotic pain medicine is not needed, then you may take acetaminophen (Tylenol) or ibuprofen (Advil) as needed. °2. Take your usually prescribed medications unless otherwise directed. °3. If you need a refill on your pain medication, please contact your pharmacy.  They will contact our office to request authorization. Prescriptions will not be filled after 5pm or on week-ends. °4. You should follow a light diet the first few days after arrival home, such as soup and crackers, etc.  Be sure to include lots of fluids daily. °5. Most patients will experience some swelling and bruising in the area of the incisions.  Ice packs will help.  Swelling and bruising can take several days to resolve.  °6. It is common to experience some constipation if taking pain medication after surgery.  Increasing fluid intake and taking a stool softener (such as Colace) will usually help or prevent this problem from occurring.  A mild laxative (Milk of Magnesia or Miralax) should be taken according to package instructions if there are no bowel movements after 48 hours. °7. Unless discharge instructions indicate otherwise, you may remove your bandages 24-48 hours after surgery, and you may shower at that time.  You may have steri-strips (small skin tapes) in place directly over the incision.  These strips should be left on the skin for 7-10 days.  If your surgeon used skin glue on the incision, you may shower in 24 hours.  The glue will flake off over the next 2-3 weeks.  Any sutures or  staples will be removed at the office during your follow-up visit. °8. ACTIVITIES:  You may resume regular (light) daily activities beginning the next day--such as daily self-care, walking, climbing stairs--gradually increasing activities as tolerated.  You may have sexual intercourse when it is comfortable.  Refrain from any heavy lifting or straining until approved by your doctor. °a. You may drive when you are no longer taking prescription pain medication, you can comfortably wear a seatbelt, and you can safely maneuver your car and apply brakes. °b. RETURN TO WORK:  __________________________________________________________ °9. You should see your doctor in the office for a follow-up appointment approximately 2-3 weeks after your surgery.  Make sure that you call for this appointment within a day or two after you arrive home to insure a convenient appointment time. °10. OTHER INSTRUCTIONS: __________________________________________________________________________________________________________________________ __________________________________________________________________________________________________________________________ °WHEN TO CALL YOUR DOCTOR: °1. Fever over 101.0 °2. Inability to urinate °3. Continued bleeding from incision. °4. Increased pain, redness, or drainage from the incision. °5. Increasing abdominal pain ° °The clinic staff is available to answer your questions during regular business hours.  Please don’t hesitate to call and ask to speak to one of the nurses for clinical concerns.  If you have a medical emergency, go to the nearest emergency room or call 911.  A surgeon from [facility] is always on call at the hospital. °[address], [address], [place], [place]  [address] ? [address] [address], [place], [place]   [address] °[phone] ? [phone] ? FAX [phone] °Web site:   [email or web address] °

## 2014-09-29 ENCOUNTER — Encounter (HOSPITAL_COMMUNITY): Payer: Self-pay | Admitting: General Surgery

## 2014-09-30 ENCOUNTER — Encounter (HOSPITAL_COMMUNITY): Payer: Self-pay | Admitting: Emergency Medicine

## 2014-09-30 ENCOUNTER — Emergency Department (HOSPITAL_COMMUNITY)
Admission: EM | Admit: 2014-09-30 | Discharge: 2014-09-30 | Disposition: A | Discharge status: Home / Self Care | Payer: Medicaid Other | Attending: Emergency Medicine | Admitting: Emergency Medicine

## 2014-09-30 DIAGNOSIS — K219 Gastro-esophageal reflux disease without esophagitis: Secondary | ICD-10-CM | POA: Diagnosis not present

## 2014-09-30 DIAGNOSIS — R109 Unspecified abdominal pain: Secondary | ICD-10-CM | POA: Insufficient documentation

## 2014-09-30 DIAGNOSIS — Z79899 Other long term (current) drug therapy: Secondary | ICD-10-CM | POA: Insufficient documentation

## 2014-09-30 DIAGNOSIS — Z9089 Acquired absence of other organs: Secondary | ICD-10-CM | POA: Diagnosis not present

## 2014-09-30 DIAGNOSIS — Z8669 Personal history of other diseases of the nervous system and sense organs: Secondary | ICD-10-CM | POA: Diagnosis not present

## 2014-09-30 DIAGNOSIS — G8918 Other acute postprocedural pain: Secondary | ICD-10-CM | POA: Diagnosis not present

## 2014-09-30 DIAGNOSIS — Z862 Personal history of diseases of the blood and blood-forming organs and certain disorders involving the immune mechanism: Secondary | ICD-10-CM | POA: Insufficient documentation

## 2014-09-30 DIAGNOSIS — Z8659 Personal history of other mental and behavioral disorders: Secondary | ICD-10-CM | POA: Diagnosis not present

## 2014-09-30 LAB — CBC WITH DIFFERENTIAL/PLATELET
Basophils Absolute: 0 10*3/uL (ref 0.0–0.1)
Basophils Relative: 0 % (ref 0–1)
EOS ABS: 0 10*3/uL (ref 0.0–0.7)
Eosinophils Relative: 1 % (ref 0–5)
HCT: 37.8 % (ref 36.0–46.0)
HEMOGLOBIN: 12.5 g/dL (ref 12.0–15.0)
LYMPHS ABS: 2.5 10*3/uL (ref 0.7–4.0)
Lymphocytes Relative: 34 % (ref 12–46)
MCH: 29.8 pg (ref 26.0–34.0)
MCHC: 33.1 g/dL (ref 30.0–36.0)
MCV: 90 fL (ref 78.0–100.0)
MONOS PCT: 6 % (ref 3–12)
Monocytes Absolute: 0.4 10*3/uL (ref 0.1–1.0)
Neutro Abs: 4.3 10*3/uL (ref 1.7–7.7)
Neutrophils Relative %: 59 % (ref 43–77)
Platelets: 198 10*3/uL (ref 150–400)
RBC: 4.2 MIL/uL (ref 3.87–5.11)
RDW: 12.5 % (ref 11.5–15.5)
WBC: 7.2 10*3/uL (ref 4.0–10.5)

## 2014-09-30 LAB — COMPREHENSIVE METABOLIC PANEL
ALBUMIN: 4.5 g/dL (ref 3.5–5.2)
ALT: 101 U/L — AB (ref 0–35)
AST: 82 U/L — AB (ref 0–37)
Alkaline Phosphatase: 64 U/L (ref 39–117)
Anion gap: 5 (ref 5–15)
BUN: 14 mg/dL (ref 6–23)
CHLORIDE: 105 mmol/L (ref 96–112)
CO2: 28 mmol/L (ref 19–32)
CREATININE: 0.56 mg/dL (ref 0.50–1.10)
Calcium: 9.3 mg/dL (ref 8.4–10.5)
GFR calc Af Amer: >90 mL/min (ref >90)
Glucose, Bld: 83 mg/dL (ref 70–99)
Potassium: 3.6 mmol/L (ref 3.5–5.1)
SODIUM: 138 mmol/L (ref 135–145)
Total Bilirubin: 1 mg/dL (ref 0.3–1.2)
Total Protein: 7.4 g/dL (ref 6.0–8.3)

## 2014-09-30 LAB — LIPASE, BLOOD: Lipase: 21 U/L (ref 11–59)

## 2014-09-30 NOTE — Discharge Instructions (Signed)
Continue medication given by your surgeon. Follow-up with your surgeon. Incision Care An incision is when a surgeon cuts into your body tissues. After surgery, the incision needs to be cared for properly to prevent infection.  HOME CARE INSTRUCTIONS   Take all medicine as directed by your caregiver. Only take over-the-counter or prescription medicines for pain, discomfort, or fever as directed by your caregiver.  Do not remove your bandage (dressing) or get your incision wet until your surgeon gives you permission. In the event that your dressing becomes wet, dirty, or starts to smell, change the dressing and call your surgeon for instructions as soon as possible.  Take showers. Do not take tub baths, swim, or do anything that may soak the wound until it is healed.  Resume your normal diet and activities as directed or allowed.  Avoid lifting any weight until you are instructed otherwise.  Use anti-itch antihistamine medicine as directed by your caregiver. The wound may itch when it is healing. Do not pick or scratch at the wound.  Follow up with your caregiver for stitch (suture) or staple removal as directed.  Drink enough fluids to keep your urine clear or pale yellow. SEEK MEDICAL CARE IF:   You have redness, swelling, or increasing pain in the wound that is not controlled with medicine.  You have drainage, blood, or pus coming from the wound that lasts longer than 1 day.  You develop muscle aches, chills, or a general ill feeling.  You notice a bad smell coming from the wound or dressing.  Your wound edges separate after the sutures, staples, or skin adhesive strips have been removed.  You develop persistent nausea or vomiting. SEEK IMMEDIATE MEDICAL CARE IF:   You have a fever.  You develop a rash.  You develop dizzy episodes or faint while standing.  You have difficulty breathing.  You develop any reaction or side effects to medicine given. MAKE SURE YOU:    Understand these instructions.  Will watch your condition.  Will get help right away if you are not doing well or get worse. Document Released: 01/25/2005 Document Revised: 09/30/2011 Document Reviewed: 09/01/2013 [facility] Patient Information 2015 [employer] [place]. This information is not intended to replace advice given to you by your health care provider. Make sure you discuss any questions you have with your health care provider.

## 2014-09-30 NOTE — ED Notes (Addendum)
Pt had gallbladder removed on 3/9, pt c/o of pain to right side of abd and states the incision is bleeding, no blood noted in triage. Pt c/o nausea as well.

## 2014-09-30 NOTE — ED Provider Notes (Signed)
CSN: [id number]     Arrival date & time 09/30/14  1538 History   First MD Initiated Contact with Patient 09/30/14 1722     Chief Complaint  Patient presents with  . Post-op Problem     (Consider location/radiation/quality/duration/timing/severity/associated sxs/prior Treatment) HPI Comments: 19 year old female presenting with bleeding from her incision after having laparoscopic cholecystectomy 2 days ago by Dr. Derrell Lolling. She noticed a small amount of blood from one of the incisions this morning. Admits to associated right-sided abdominal pain, constant, 9/10 "since I woke up from surgery". States "oxy doesn't do anything". Pain unchanged from awaking post-op. Denies fever, chills, nausea or vomiting. Despite triage summary, patient does deny nausea. Normal bowel movements. She did not try to call Dr. Derrell Lolling office.  The history is provided by the patient.    Past Medical History  Diagnosis Date  . Anxiety 2011    no meds during pregnancy  . 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 Gall stones   . Anemia 2013  . GERD (gastroesophageal reflux disease)    Past Surgical History  Procedure Laterality Date  . Wisdom tooth extraction  2013  . Direct laryngoscopy N/A 04/24/2014    Procedure: DIRECT LARYNGOSCOPY;  Surgeon: [name], MD;  Location: [facility] OR;  Service: ENT;  Laterality: N/A;  . Foreign body removal esophageal N/A 04/24/2014    Procedure: REMOVAL FOREIGN BODY ESOPHAGEAL;  Surgeon: [name], MD;  Location: [facility] OR;  Service: ENT;  Laterality: N/A;  . Cholecystectomy N/A 09/28/2014    Procedure: LAPAROSCOPIC CHOLECYSTECTOMY;  Surgeon: Axel Filler, MD;  Location: [facility] OR;  Service: General;  Laterality: N/A;   Family History  Problem Relation Age of Onset  . Arthritis Mother   . Bronchitis Mother   . Asthma Mother   . Hearing loss Paternal Grandfather   . Cancer Neg Hx   . Diabetes Neg Hx    History  Substance Use  Topics  . Smoking status: Never Smoker   . Smokeless tobacco: Never Used  . Alcohol Use: No   OB History    Gravida Para Term Preterm AB TAB SAB Ectopic Multiple Living   0 0 0 0 0 0 1     Review of Systems  Gastrointestinal: Positive for abdominal pain.  Skin: Positive for wound.  All other systems reviewed and are negative.     Allergies  Review of patient's allergies indicates no known allergies.  Home Medications   Prior to Admission medications   Medication Sig Start Date End Date Taking? Authorizing Provider  medroxyPROGESTERone (DEPO-PROVERA) 150 MG/ML injection Inject 150 mg into the muscle every 3 (three) months.   Yes Historical Provider, MD  ondansetron (ZOFRAN) 4 MG tablet Take 4 mg by mouth every 8 (eight) hours as needed for nausea or vomiting.   Yes Historical Provider, MD  oxyCODONE-acetaminophen (ROXICET) 5-325 MG per tablet Take 1-2 tablets by mouth every 4 (four) hours as needed. 09/28/14  Yes Axel Filler, MD  ranitidine (ZANTAC) 150 MG tablet Take 150 mg by mouth 2 (two) times daily.   Yes Historical Provider, MD   BP 115/66 mmHg  Pulse 84  Temp(Src) 98.8 F (37.1 C) (Oral)  Resp 18  SpO2 100%  LMP 09/20/2014 Physical Exam  Constitutional: She is oriented to person, place, and time. She appears well-developed and well-nourished. No distress.  HENT:  Head: Normocephalic and  atraumatic.  Mouth/Throat: Oropharynx is clear and moist.  Eyes: Conjunctivae are normal.  Neck: Normal range of motion. Neck supple.  Cardiovascular: Normal rate, regular rhythm and normal heart sounds.   Pulmonary/Chest: Effort normal and breath sounds normal.  Abdominal: Soft. Bowel sounds are normal.    Three well healing 1 cm incisions with steri-strips in place. No bleeding, erythema, warmth. Mild tenderness over incisions. Abdomen soft. BS normal. No peritoneal signs.  Musculoskeletal: Normal range of motion. She exhibits no edema.  Neurological: She is alert and  oriented to person, place, and time.  Skin: Skin is warm and dry. She is not diaphoretic.  Psychiatric: She has a normal mood and affect. Her behavior is normal.    ED Course  Procedures (including critical care time) Labs Review Labs Reviewed  COMPREHENSIVE METABOLIC PANEL - Abnormal; Notable for the following:    AST 82 (*)    ALT 101 (*)    All other components within normal limits  CBC WITH DIFFERENTIAL/PLATELET  LIPASE, BLOOD    Imaging Review No results found.   EKG Interpretation None      MDM   Final diagnoses:  Post-op pain   NAD. AF VSS. Abdomen is soft with mild tenderness over incisions. No peritoneal signs. Surgical incisions are well appearing. No signs of infection. I discussed with her that the pain will not go away immediately after surgery, and to continue taking the medication prescribed by Dr. Derrell Lollingamirez. I also advised her to follow-up with Dr. Derrell Lollingamirez. Very low suspicion for bowel obstruction or postop infection. Stable for d/c. Return precautions given. Patient states understanding of treatment care plan and is agreeable.   Leah SpeedRobyn M Theora Vankirk, PA-C 09/30/14 1810  Arby BarretteMarcy Pfeiffer, MD 09/30/14 [phone]

## 2014-10-11 ENCOUNTER — Other Ambulatory Visit: Payer: Medicaid Other

## 2014-10-12 ENCOUNTER — Other Ambulatory Visit (INDEPENDENT_AMBULATORY_CARE_PROVIDER_SITE_OTHER): Payer: Medicaid Other

## 2014-10-12 DIAGNOSIS — Z30013 Encounter for initial prescription of injectable contraceptive: Secondary | ICD-10-CM | POA: Diagnosis not present

## 2014-10-12 LAB — POCT URINE PREGNANCY: Preg Test, Ur: NEGATIVE

## 2014-10-12 NOTE — Progress Notes (Signed)
Patient is in the office today for a UPT depo restart. This is her first UPT, results were negative. Patient notified that she would need to abstain from sexual intercourse for 2 weeks and then we would make her an appointment to come back for another UPT and her DEPO Injection as long as she was not sexually active in those 2 weeks. Patient voiced understanding and denied any concerns today.   Results for orders placed or performed in visit on 10/12/14 (from the past 24 hour(s))  POCT urine pregnancy     Status: None   Collection Time: 10/12/14  2:59 PM  Result Value Ref Range   Preg Test, Ur Negative

## 2014-10-27 ENCOUNTER — Other Ambulatory Visit (INDEPENDENT_AMBULATORY_CARE_PROVIDER_SITE_OTHER): Payer: Medicaid Other | Admitting: *Deleted

## 2014-10-27 VITALS — BP 96/63 | HR 82 | Temp 98.2°F | Wt 91.6 lb

## 2014-10-27 DIAGNOSIS — Z3042 Encounter for surveillance of injectable contraceptive: Secondary | ICD-10-CM

## 2014-10-27 LAB — POCT URINE PREGNANCY: Preg Test, Ur: NEGATIVE

## 2014-10-27 MED ORDER — MEDROXYPROGESTERONE ACETATE 150 MG/ML IM SUSP
150.0000 mg | Freq: Once | INTRAMUSCULAR | Status: AC
  Administered 2014-10-27: 150 mg via INTRAMUSCULAR

## 2014-10-27 NOTE — Progress Notes (Addendum)
Pt is in office today for upt and restart depo.  Pt UPT in office today is negative.  Pt states that she had intercourse 4 days ago with condom use.  Per Dr Clearance CootsHarper, ok to give depo today.  Pt tolerated well.  Pt advised to RTO on 01-18-15 for next depo injection.  Pt reminded of depo policy and to be on time for injection or will have to restart again.  Pt states understanding. .lab.  BP 96/63 mmHg  Pulse 82  Temp(Src) 98.2 F (36.8 C)  Wt 91 lb 9.6 oz (41.549 kg)  Administrations This Visit    medroxyPROGESTERone (DEPO-PROVERA) injection 150 mg    Admin Date Action Dose Route Administered By         10/27/2014 Given 150 mg Intramuscular Lanney GinsSuzanne D Foster, CMA                 Lab Results  Component Value Date   PREGTESTUR Negative 10/27/2014   PREGSERUM NEGATIVE 09/21/2014   HCG 32838.0 09/30/2012

## 2014-10-28 ENCOUNTER — Ambulatory Visit: Payer: Medicaid Other | Admitting: Certified Nurse Midwife

## 2014-11-04 ENCOUNTER — Ambulatory Visit: Payer: Medicaid Other | Admitting: Certified Nurse Midwife

## 2014-11-04 ENCOUNTER — Encounter (HOSPITAL_COMMUNITY): Payer: Self-pay | Admitting: *Deleted

## 2014-11-04 ENCOUNTER — Emergency Department (HOSPITAL_COMMUNITY)
Admission: EM | Admit: 2014-11-04 | Discharge: 2014-11-05 | Disposition: A | Discharge status: Home / Self Care | Payer: Medicaid Other | Attending: Emergency Medicine | Admitting: Emergency Medicine

## 2014-11-04 DIAGNOSIS — Z8659 Personal history of other mental and behavioral disorders: Secondary | ICD-10-CM | POA: Diagnosis not present

## 2014-11-04 DIAGNOSIS — Z79899 Other long term (current) drug therapy: Secondary | ICD-10-CM | POA: Insufficient documentation

## 2014-11-04 DIAGNOSIS — R112 Nausea with vomiting, unspecified: Secondary | ICD-10-CM | POA: Diagnosis not present

## 2014-11-04 DIAGNOSIS — R109 Unspecified abdominal pain: Secondary | ICD-10-CM | POA: Diagnosis present

## 2014-11-04 DIAGNOSIS — Z862 Personal history of diseases of the blood and blood-forming organs and certain disorders involving the immune mechanism: Secondary | ICD-10-CM | POA: Diagnosis not present

## 2014-11-04 DIAGNOSIS — K219 Gastro-esophageal reflux disease without esophagitis: Secondary | ICD-10-CM | POA: Insufficient documentation

## 2014-11-04 DIAGNOSIS — R197 Diarrhea, unspecified: Secondary | ICD-10-CM | POA: Diagnosis not present

## 2014-11-04 LAB — CBC WITH DIFFERENTIAL/PLATELET
Basophils Absolute: 0 10*3/uL (ref 0.0–0.1)
Basophils Relative: 0 % (ref 0–1)
Eosinophils Absolute: 0.2 10*3/uL (ref 0.0–0.7)
Eosinophils Relative: 3 % (ref 0–5)
HCT: 37.1 % (ref 36.0–46.0)
HEMOGLOBIN: 12.2 g/dL (ref 12.0–15.0)
LYMPHS PCT: 55 % — AB (ref 12–46)
Lymphs Abs: 3.3 10*3/uL (ref 0.7–4.0)
MCH: 29.3 pg (ref 26.0–34.0)
MCHC: 32.9 g/dL (ref 30.0–36.0)
MCV: 89 fL (ref 78.0–100.0)
MONOS PCT: 6 % (ref 3–12)
Monocytes Absolute: 0.3 10*3/uL (ref 0.1–1.0)
NEUTROS ABS: 2.1 10*3/uL (ref 1.7–7.7)
Neutrophils Relative %: 36 % — ABNORMAL LOW (ref 43–77)
Platelets: 205 10*3/uL (ref 150–400)
RBC: 4.17 MIL/uL (ref 3.87–5.11)
RDW: 12.6 % (ref 11.5–15.5)
WBC: 5.9 10*3/uL (ref 4.0–10.5)

## 2014-11-04 LAB — URINALYSIS, ROUTINE W REFLEX MICROSCOPIC
BILIRUBIN URINE: NEGATIVE
Glucose, UA: NEGATIVE mg/dL
Hgb urine dipstick: NEGATIVE
Ketones, ur: NEGATIVE mg/dL
Leukocytes, UA: NEGATIVE
Nitrite: NEGATIVE
PH: 5.5 (ref 5.0–8.0)
Protein, ur: NEGATIVE mg/dL
Specific Gravity, Urine: 1.021 (ref 1.005–1.030)
Urobilinogen, UA: 0.2 mg/dL (ref 0.0–1.0)

## 2014-11-04 LAB — COMPREHENSIVE METABOLIC PANEL
ALBUMIN: 4.1 g/dL (ref 3.5–5.2)
ALT: 18 U/L (ref 0–35)
AST: 21 U/L (ref 0–37)
Alkaline Phosphatase: 68 U/L (ref 39–117)
Anion gap: 10 (ref 5–15)
BUN: 12 mg/dL (ref 6–23)
CALCIUM: 9 mg/dL (ref 8.4–10.5)
CO2: 23 mmol/L (ref 19–32)
Chloride: 107 mmol/L (ref 96–112)
Creatinine, Ser: 0.76 mg/dL (ref 0.50–1.10)
GFR calc Af Amer: >90 mL/min (ref >90)
GFR calc non Af Amer: >90 mL/min (ref >90)
Glucose, Bld: 87 mg/dL (ref 70–99)
Potassium: 3.5 mmol/L (ref 3.5–5.1)
SODIUM: 140 mmol/L (ref 135–145)
Total Bilirubin: 0.8 mg/dL (ref 0.3–1.2)
Total Protein: 7 g/dL (ref 6.0–8.3)

## 2014-11-04 LAB — LIPASE, BLOOD: Lipase: 31 U/L (ref 11–59)

## 2014-11-04 MED ORDER — SODIUM CHLORIDE 0.9 % IV BOLUS (SEPSIS)
1000.0000 mL | Freq: Once | INTRAVENOUS | Status: AC
  Administered 2014-11-05: 1000 mL via INTRAVENOUS

## 2014-11-04 MED ORDER — ONDANSETRON HCL 4 MG/2ML IJ SOLN
4.0000 mg | Freq: Once | INTRAMUSCULAR | Status: AC
  Administered 2014-11-05: 4 mg via INTRAVENOUS
  Filled 2014-11-04: qty 2

## 2014-11-04 NOTE — ED Notes (Signed)
The pt is c/o abd pain with nv and d today.  lmp none on depo

## 2014-11-04 NOTE — ED Provider Notes (Signed)
CSN: [id number]     Arrival date & time 11/04/14  2200 History   First MD Initiated Contact with Patient 11/04/14 2232     Chief Complaint  Patient presents with  . Abdominal Pain     (Consider location/radiation/quality/duration/timing/severity/associated sxs/prior Treatment) HPI   19 year old female with history of anxiety, pseudoseizures, GERD, who presents complaining of abdominal pain. Patient endorse nausea vomiting diarrhea for nearly a week. Her vomit is non-bilious, nonbloody, and diarrhea is non-mucousy nonbloody. She reports vomiting and diarrhea usually happen shortly after eating. She developed left lower quadrant abdominal pain since a few hours ago. Pain is described as a stabbing cramping sensation, 9 out of 10, nonradiating and persistent. Patient reports sick contact with similar complaint including both her son and her friend. Patient has tried taking Tylenol with minimal relief.  Has had cholecystectomy last month.    Past Medical History  Diagnosis Date  . Anxiety 2011    no meds during pregnancy  . 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 Gall stones   . Anemia 2013  . GERD (gastroesophageal reflux disease)    Past Surgical History  Procedure Laterality Date  . Wisdom tooth extraction  2013  . Direct laryngoscopy N/A 04/24/2014    Procedure: DIRECT LARYNGOSCOPY;  Surgeon: [name], MD;  Location: [facility] OR;  Service: ENT;  Laterality: N/A;  . Foreign body removal esophageal N/A 04/24/2014    Procedure: REMOVAL FOREIGN BODY ESOPHAGEAL;  Surgeon: [name], MD;  Location: [facility] OR;  Service: ENT;  Laterality: N/A;  . Cholecystectomy N/A 09/28/2014    Procedure: LAPAROSCOPIC CHOLECYSTECTOMY;  Surgeon: Axel Filler, MD;  Location: [facility] OR;  Service: General;  Laterality: N/A;   Family History  Problem Relation Age of Onset  . Arthritis Mother   . Bronchitis Mother   . Asthma Mother   . Hearing loss Paternal  Grandfather   . Cancer Neg Hx   . Diabetes Neg Hx    History  Substance Use Topics  . Smoking status: Never Smoker   . Smokeless tobacco: Never Used  . Alcohol Use: No   OB History    Gravida Para Term Preterm AB TAB SAB Ectopic Multiple Living   0 0 0 0 0 0 1     Review of Systems  All other systems reviewed and are negative.     Allergies  Review of patient's allergies indicates no known allergies.  Home Medications   Prior to Admission medications   Medication Sig Start Date End Date Taking? Authorizing Provider  medroxyPROGESTERone (DEPO-PROVERA) 150 MG/ML injection Inject 150 mg into the muscle every 3 (three) months.    Historical Provider, MD  ondansetron (ZOFRAN) 4 MG tablet Take 4 mg by mouth every 8 (eight) hours as needed for nausea or vomiting.    Historical Provider, MD  oxyCODONE-acetaminophen (ROXICET) 5-325 MG per tablet Take 1-2 tablets by mouth every 4 (four) hours as needed. 09/28/14   Axel Filler, MD  ranitidine (ZANTAC) 150 MG tablet Take 150 mg by mouth 2 (two) times daily.    Historical Provider, MD   BP 115/67 mmHg  Pulse 78  Temp(Src) 97.6 F (36.4 C) (Oral)  Resp 16  Ht 5' (1.524 m)  Wt 93 lb 4.8 oz (42.321 kg)  BMI 18.22 kg/m2  SpO2 100% Physical Exam  Constitutional: She is oriented to person, place, and time.  She appears well-developed and well-nourished. No distress.  HENT:  Head: Atraumatic.  Eyes: Conjunctivae are normal.  Neck: Neck supple.  Cardiovascular: Normal rate and regular rhythm.   Pulmonary/Chest: Effort normal and breath sounds normal.  Abdominal: Soft. Bowel sounds are normal. There is tenderness (tenderness to L side of abdomen without guarding or rebound tenderness.  Negative Murphy sign, no pain at McBurney's point. ).  Neurological: She is alert and oriented to person, place, and time.  Skin: No rash noted.  Psychiatric: She has a normal mood and affect.  Nursing note and vitals reviewed.   ED Course   Procedures (including critical care time)  Patient presents with symptoms consistence with viral GI. She has a nonsurgical abdomen. Labs are reassuring, no fever, vital signs stable. Patient felt better after receiving treatment including IV fluid. Patient is stable for discharge with strict return precaution.  She is able to tolerates PO.    Labs Review Labs Reviewed  CBC WITH DIFFERENTIAL/PLATELET - Abnormal; Notable for the following:    Neutrophils Relative % 36 (*)    Lymphocytes Relative 55 (*)    All other components within normal limits  URINALYSIS, ROUTINE W REFLEX MICROSCOPIC - Abnormal; Notable for the following:    APPearance CLOUDY (*)    All other components within normal limits  COMPREHENSIVE METABOLIC PANEL  LIPASE, BLOOD    Imaging Review No results found.   EKG Interpretation None      MDM   Final diagnoses:  Nausea vomiting and diarrhea    BP 101/62 mmHg  Pulse 71  Temp(Src) 97.6 F (36.4 C) (Oral)  Resp 16  Ht 5' (1.524 m)  Wt 93 lb 4.8 oz (42.321 kg)  BMI 18.22 kg/m2  SpO2 99%     Fayrene HelperBowie Leya Paige, PA-C 11/05/14 0057  Tilden FossaElizabeth Rees, MD 11/05/14 [phone]

## 2014-11-05 MED ORDER — DICYCLOMINE HCL 20 MG PO TABS: 20.0000 mg | ORAL_TABLET | Freq: Two times a day (BID) | ORAL | Status: DC

## 2014-11-05 MED ORDER — ONDANSETRON HCL 4 MG PO TABS: 4.0000 mg | ORAL_TABLET | Freq: Three times a day (TID) | ORAL | Status: DC | PRN

## 2014-11-05 NOTE — ED Notes (Signed)
Pt. Left with all belongings and refused wheelchair 

## 2014-11-05 NOTE — Discharge Instructions (Signed)

## 2014-11-18 ENCOUNTER — Other Ambulatory Visit: Payer: Self-pay | Admitting: Certified Nurse Midwife

## 2014-11-18 ENCOUNTER — Encounter: Payer: Self-pay | Admitting: Certified Nurse Midwife

## 2014-11-18 ENCOUNTER — Telehealth: Payer: Self-pay

## 2014-11-18 ENCOUNTER — Ambulatory Visit (INDEPENDENT_AMBULATORY_CARE_PROVIDER_SITE_OTHER): Payer: Medicaid Other | Admitting: Certified Nurse Midwife

## 2014-11-18 VITALS — BP 101/64 | HR 77 | Temp 97.4°F | Ht 60.0 in | Wt 91.0 lb

## 2014-11-18 DIAGNOSIS — E46 Unspecified protein-calorie malnutrition: Secondary | ICD-10-CM

## 2014-11-18 DIAGNOSIS — Z0000 Encounter for general adult medical examination without abnormal findings: Secondary | ICD-10-CM

## 2014-11-18 DIAGNOSIS — Z01419 Encounter for gynecological examination (general) (routine) without abnormal findings: Secondary | ICD-10-CM

## 2014-11-18 DIAGNOSIS — O9279 Other disorders of lactation: Secondary | ICD-10-CM

## 2014-11-18 DIAGNOSIS — O9270 Unspecified disorders of lactation: Secondary | ICD-10-CM | POA: Diagnosis not present

## 2014-11-18 LAB — COMPREHENSIVE METABOLIC PANEL
ALT: 14 U/L (ref 0–35)
AST: 16 U/L (ref 0–37)
Albumin: 4.2 g/dL (ref 3.5–5.2)
Alkaline Phosphatase: 58 U/L (ref 39–117)
BUN: 12 mg/dL (ref 6–23)
CHLORIDE: 108 meq/L (ref 96–112)
CO2: 21 meq/L (ref 19–32)
CREATININE: 0.59 mg/dL (ref 0.50–1.10)
Calcium: 9.2 mg/dL (ref 8.4–10.5)
GLUCOSE: 66 mg/dL — AB (ref 70–99)
POTASSIUM: 4.1 meq/L (ref 3.5–5.3)
Sodium: 140 mEq/L (ref 135–145)
Total Bilirubin: 0.9 mg/dL (ref 0.2–1.2)
Total Protein: 6.9 g/dL (ref 6.0–8.3)

## 2014-11-18 LAB — CBC WITH DIFFERENTIAL/PLATELET
BASOS ABS: 0 10*3/uL (ref 0.0–0.1)
BASOS PCT: 0 % (ref 0–1)
EOS ABS: 0.1 10*3/uL (ref 0.0–0.7)
Eosinophils Relative: 2 % (ref 0–5)
HCT: 36.8 % (ref 36.0–46.0)
Hemoglobin: 12.2 g/dL (ref 12.0–15.0)
Lymphocytes Relative: 48 % — ABNORMAL HIGH (ref 12–46)
Lymphs Abs: 2.3 10*3/uL (ref 0.7–4.0)
MCH: 29.5 pg (ref 26.0–34.0)
MCHC: 33.2 g/dL (ref 30.0–36.0)
MCV: 89.1 fL (ref 78.0–100.0)
MONO ABS: 0.2 10*3/uL (ref 0.1–1.0)
MONOS PCT: 5 % (ref 3–12)
MPV: 10.7 fL (ref 8.6–12.4)
NEUTROS PCT: 45 % (ref 43–77)
Neutro Abs: 2.2 10*3/uL (ref 1.7–7.7)
PLATELETS: 245 10*3/uL (ref 150–400)
RBC: 4.13 MIL/uL (ref 3.87–5.11)
RDW: 13.8 % (ref 11.5–15.5)
WBC: 4.8 10*3/uL (ref 4.0–10.5)

## 2014-11-18 LAB — TSH: TSH: 1.003 u[IU]/mL (ref 0.350–4.500)

## 2014-11-18 NOTE — Progress Notes (Signed)
Patient ID: Leah [name], female   DOB: 06/23/1996, 19 y.o.   MRN: [id number]   Subjective:     Leah [name] is a 19 y.o. female here for a routine exam.  Current complaints: amenorrhea for 1.5 years since on Depo-provera, reports regular cycles before depo. Does not like not having periods.  Desires to switch birth control to Mirena IUD.   Had past hx of depression was on lexapro for several years until pregnancy.   Stopped breastfeeding at 3 months, her son is 2421 months old now.  Only has leakage from left breast, educated not to stimulate the breasts and to get a good supportive bra.  Not currently sexually active.  Desires STD screening.    Personal health questionnaire:  Is patient Ashkenazi Jewish, have a family history of breast and/or ovarian cancer: no Is there a family history of uterine cancer diagnosed at age < 1850, gastrointestinal cancer, urinary tract cancer, family member who is a Personnel officerLynch syndrome-associated carrier: no Is the patient overweight and hypertensive, family history of diabetes, personal history of gestational diabetes, preeclampsia or PCOS: no Is patient over 1555, have PCOS,  family history of premature CHD under age 20, diabetes, smoke, have hypertension or peripheral artery disease:  no At any time, has a partner hit, kicked or otherwise hurt or frightened you?: no Over the past 2 weeks, have you felt down, depressed or hopeless?: no Over the past 2 weeks, have you felt little interest or pleasure in doing things?:no   Gynecologic History No LMP recorded. Patient has had an injection. Contraception: Depo-Provera injections Last Pap: N/A.  Last mammogram: N/A.   Obstetric History OB History  Gravida Para Term Preterm AB SAB TAB Ectopic Multiple Living  1 1 1  0 0 0 0 0 0 1    # Outcome Date GA Lbr Len/2nd Weight Sex Delivery Anes PTL Lv  1 Term 03/14/13 [id number] 17:30 / 00:33 2.895 kg (6 lb 6.1 oz) M Vag-Spont EPI  Y     Comments: none      Past Medical History   Diagnosis Date  . Anxiety 2011    no meds during pregnancy  . 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 Gall stones   . Anemia 2013  . GERD (gastroesophageal reflux disease)     Past Surgical History  Procedure Laterality Date  . Wisdom tooth extraction  2013  . Direct laryngoscopy N/A 04/24/2014    Procedure: DIRECT LARYNGOSCOPY;  Surgeon: Darletta MollSui W Teoh, MD;  [facility];  Service: ENT;  Laterality: N/A;  . Foreign body removal esophageal N/A 04/24/2014    Procedure: REMOVAL FOREIGN BODY ESOPHAGEAL;  Surgeon: Darletta MollSui W Teoh, MD;  [facility];  Service: ENT;  Laterality: N/A;  . Cholecystectomy N/A 09/28/2014    Procedure: LAPAROSCOPIC CHOLECYSTECTOMY;  Surgeon: Axel FillerArmando Ramirez, MD;  Location: [facility] OR;  Service: General;  Laterality: N/A;     Current outpatient prescriptions:  .  ibuprofen (ADVIL,MOTRIN) 600 MG tablet, Take 600 mg by mouth every 6 (six) hours as needed., Disp: , Rfl:  .  medroxyPROGESTERone (DEPO-PROVERA) 150 MG/ML injection, Inject 150 mg into the muscle every 3 (three) months., Disp: , Rfl:  No Known Allergies  History  Substance Use Topics  . Smoking status: Never Smoker   . Smokeless tobacco: Never Used  . Alcohol Use: No    Family History  Problem Relation  Age of Onset  . Arthritis Mother   . Bronchitis Mother   . Asthma Mother   . Hearing loss Paternal Grandfather   . Cancer Neg Hx   . Diabetes Neg Hx       Review of Systems  Constitutional: negative for fatigue and weight loss Respiratory: negative for cough and wheezing Cardiovascular: negative for chest pain, fatigue and palpitations Gastrointestinal: negative for abdominal pain and change in bowel habits Musculoskeletal:negative for myalgias Neurological: negative for gait problems and tremors Behavioral/Psych: negative for abusive relationship, depression Endocrine: negative for temperature intolerance   Genitourinary:negative for  abnormal menstrual periods, genital lesions, hot flashes, sexual problems and vaginal discharge Integument/breast: negative for breast lump, breast tenderness,  and skin lesion(s).  +nipple discharge from left breast.    Objective:       BP 101/64 mmHg  Pulse 77  Temp(Src) 97.4 F (36.3 C)  Ht 5' (1.524 m)  Wt 41.277 kg (91 lb)  BMI 17.77 kg/m2 General:   alert  Skin:   no rash or abnormalities  Lungs:   clear to auscultation bilaterally  Heart:   regular rate and rhythm, S1, S2 normal, no murmur, click, rub or gallop  Breasts:   normal without suspicious masses, skin or nipple changes or axillary nodes.  + breast milk expression on left side white.   Abdomen:  normal findings: no organomegaly, soft, non-tender and no hernia  Pelvis:  External genitalia: normal general appearance Urinary system: urethral meatus normal and bladder without fullness, nontender Vaginal: normal without tenderness, induration or masses Cervix: normal appearance Adnexa: normal bimanual exam Uterus: anteverted and non-tender, normal size   Lab Review Urine pregnancy test Labs reviewed yes Radiologic studies reviewed no  50% of 30 min visit spent on counseling and coordination of care.   Assessment:    Healthy female exam.   Lactation in non breastfeeding female malnurished   Plan:    Education reviewed: depression evaluation, safe sex/STD prevention, self breast exams, skin cancer screening, weight bearing exercise and increased caloric intake. Contraception: Depo-Provera injections. Follow up in: 2 months. For Mirena insertion RX for boost  Meds ordered this encounter  Medications  . ibuprofen (ADVIL,MOTRIN) 600 MG tablet    Sig: Take 600 mg by mouth every 6 (six) hours as needed.   Orders Placed This Encounter  Procedures  . MM Digital Diagnostic Bilat    Standing Status: Future     Number of Occurrences:      Standing Expiration Date: 01/18/2016    Order Specific Question:  Reason  for Exam (SYMPTOM  OR DIAGNOSIS REQUIRED)    Answer:  Left breastmilk production    Order Specific Question:  Is the patient pregnant?    Answer:  No    Order Specific Question:  Preferred imaging location?    Answer:  [facility]  . HIV antibody (with reflex)  . Hepatitis B surface antigen  . RPR  . Hepatitis C antibody  . TSH  . Prolactin  . CBC with Differential/Platelet  . Comprehensive metabolic panel  . Referral to Nutrition and Diabetes Services    Referral Priority:  Routine    Referral Type:  Consultation    Referral Reason:  Specialty Services Required    Number of Visits Requested:  1

## 2014-11-18 NOTE — Telephone Encounter (Signed)
[facility] app[facility] at [facility] at 10am on 11/24/14 - called her to let her know - also [facility] will call her for her appt with them

## 2014-11-19 LAB — HIV ANTIBODY (ROUTINE TESTING W REFLEX): HIV 1&2 Ab, 4th Generation: NONREACTIVE

## 2014-11-19 LAB — HEPATITIS B SURFACE ANTIGEN: Hepatitis B Surface Ag: NEGATIVE

## 2014-11-19 LAB — RPR

## 2014-11-19 LAB — PROLACTIN: Prolactin: 6.4 ng/mL

## 2014-11-19 LAB — HEPATITIS C ANTIBODY: HCV Ab: NEGATIVE

## 2014-11-22 LAB — SURESWAB, VAGINOSIS/VAGINITIS PLUS
Atopobium vaginae: 5.2 Log (cells/mL)
BV CATEGORY: UNDETERMINED — AB
C. albicans, DNA: NOT DETECTED
C. glabrata, DNA: NOT DETECTED
C. parapsilosis, DNA: DETECTED — AB
C. trachomatis RNA, TMA: NOT DETECTED
C. tropicalis, DNA: NOT DETECTED
GARDNERELLA VAGINALIS: 7.4 Log (cells/mL)
LACTOBACILLUS SPECIES: DETECTED Log (cells/mL)
MEGASPHAERA SPECIES: NOT DETECTED Log (cells/mL)
N. gonorrhoeae RNA, TMA: NOT DETECTED
T. VAGINALIS RNA, QL TMA: NOT DETECTED

## 2014-11-24 ENCOUNTER — Other Ambulatory Visit: Payer: Self-pay

## 2014-11-25 ENCOUNTER — Other Ambulatory Visit: Payer: Self-pay | Admitting: *Deleted

## 2014-11-25 DIAGNOSIS — B9689 Other specified bacterial agents as the cause of diseases classified elsewhere: Secondary | ICD-10-CM

## 2014-11-25 DIAGNOSIS — B379 Candidiasis, unspecified: Secondary | ICD-10-CM

## 2014-11-25 DIAGNOSIS — N760 Acute vaginitis: Principal | ICD-10-CM

## 2014-11-25 MED ORDER — FLUCONAZOLE 150 MG PO TABS: 150.0000 mg | ORAL_TABLET | Freq: Once | ORAL | Status: DC

## 2014-11-25 MED ORDER — METRONIDAZOLE 500 MG PO TABS: 500.0000 mg | ORAL_TABLET | Freq: Two times a day (BID) | ORAL | Status: DC

## 2014-11-25 NOTE — Progress Notes (Signed)
Rx for BV and yeast to pharmacy. See lab note

## 2014-11-28 ENCOUNTER — Telehealth: Payer: Self-pay | Admitting: *Deleted

## 2014-11-28 NOTE — Telephone Encounter (Signed)
Attempted to contact the patient to schedule an appointment for a Mirena IUD. Patient's number is not in service.

## 2014-12-08 ENCOUNTER — Ambulatory Visit: Payer: Self-pay | Admitting: *Deleted

## 2015-01-12 ENCOUNTER — Emergency Department (HOSPITAL_COMMUNITY): Payer: Medicaid Other

## 2015-01-12 ENCOUNTER — Telehealth: Payer: Self-pay | Admitting: *Deleted

## 2015-01-12 ENCOUNTER — Emergency Department (HOSPITAL_COMMUNITY)
Admission: EM | Admit: 2015-01-12 | Discharge: 2015-01-13 | Disposition: A | Discharge status: Home / Self Care | Payer: Medicaid Other | Attending: Emergency Medicine | Admitting: Emergency Medicine

## 2015-01-12 ENCOUNTER — Encounter (HOSPITAL_COMMUNITY): Payer: Self-pay | Admitting: *Deleted

## 2015-01-12 DIAGNOSIS — Z8719 Personal history of other diseases of the digestive system: Secondary | ICD-10-CM | POA: Insufficient documentation

## 2015-01-12 DIAGNOSIS — N898 Other specified noninflammatory disorders of vagina: Secondary | ICD-10-CM | POA: Insufficient documentation

## 2015-01-12 DIAGNOSIS — Z862 Personal history of diseases of the blood and blood-forming organs and certain disorders involving the immune mechanism: Secondary | ICD-10-CM | POA: Insufficient documentation

## 2015-01-12 DIAGNOSIS — Z9049 Acquired absence of other specified parts of digestive tract: Secondary | ICD-10-CM | POA: Insufficient documentation

## 2015-01-12 DIAGNOSIS — R1033 Periumbilical pain: Secondary | ICD-10-CM | POA: Insufficient documentation

## 2015-01-12 DIAGNOSIS — R630 Anorexia: Secondary | ICD-10-CM | POA: Insufficient documentation

## 2015-01-12 DIAGNOSIS — Z3202 Encounter for pregnancy test, result negative: Secondary | ICD-10-CM | POA: Insufficient documentation

## 2015-01-12 DIAGNOSIS — Z8659 Personal history of other mental and behavioral disorders: Secondary | ICD-10-CM | POA: Insufficient documentation

## 2015-01-12 DIAGNOSIS — Z79899 Other long term (current) drug therapy: Secondary | ICD-10-CM | POA: Insufficient documentation

## 2015-01-12 LAB — CBC WITH DIFFERENTIAL/PLATELET
Basophils Absolute: 0 10*3/uL (ref 0.0–0.1)
Basophils Relative: 0 % (ref 0–1)
Eosinophils Absolute: 0.1 10*3/uL (ref 0.0–0.7)
Eosinophils Relative: 2 % (ref 0–5)
HEMATOCRIT: 38 % (ref 36.0–46.0)
Hemoglobin: 12.6 g/dL (ref 12.0–15.0)
LYMPHS PCT: 45 % (ref 12–46)
Lymphs Abs: 2.8 10*3/uL (ref 0.7–4.0)
MCH: 29.4 pg (ref 26.0–34.0)
MCHC: 33.2 g/dL (ref 30.0–36.0)
MCV: 88.6 fL (ref 78.0–100.0)
MONO ABS: 0.4 10*3/uL (ref 0.1–1.0)
Monocytes Relative: 7 % (ref 3–12)
NEUTROS ABS: 2.8 10*3/uL (ref 1.7–7.7)
NEUTROS PCT: 46 % (ref 43–77)
Platelets: 230 10*3/uL (ref 150–400)
RBC: 4.29 MIL/uL (ref 3.87–5.11)
RDW: 12.4 % (ref 11.5–15.5)
WBC: 6.1 10*3/uL (ref 4.0–10.5)

## 2015-01-12 LAB — WET PREP, GENITAL
TRICH WET PREP: NONE SEEN
WBC WET PREP: NONE SEEN
YEAST WET PREP: NONE SEEN

## 2015-01-12 LAB — COMPREHENSIVE METABOLIC PANEL
ALT: 17 U/L (ref 14–54)
AST: 22 U/L (ref 15–41)
Albumin: 4.3 g/dL (ref 3.5–5.0)
Alkaline Phosphatase: 61 U/L (ref 38–126)
Anion gap: 9 (ref 5–15)
BILIRUBIN TOTAL: 1.1 mg/dL (ref 0.3–1.2)
BUN: 17 mg/dL (ref 6–20)
CO2: 24 mmol/L (ref 22–32)
Calcium: 9.2 mg/dL (ref 8.9–10.3)
Chloride: 109 mmol/L (ref 101–111)
Creatinine, Ser: 0.71 mg/dL (ref 0.44–1.00)
GFR calc Af Amer: >60 mL/min (ref >60)
GLUCOSE: 79 mg/dL (ref 65–99)
POTASSIUM: 3.5 mmol/L (ref 3.5–5.1)
Sodium: 142 mmol/L (ref 135–145)
Total Protein: 7.8 g/dL (ref 6.5–8.1)

## 2015-01-12 LAB — URINALYSIS, ROUTINE W REFLEX MICROSCOPIC
BILIRUBIN URINE: NEGATIVE
GLUCOSE, UA: NEGATIVE mg/dL
Hgb urine dipstick: NEGATIVE
KETONES UR: NEGATIVE mg/dL
Nitrite: NEGATIVE
PROTEIN: NEGATIVE mg/dL
Specific Gravity, Urine: 1.03 (ref 1.005–1.030)
UROBILINOGEN UA: 0.2 mg/dL (ref 0.0–1.0)
pH: 5.5 (ref 5.0–8.0)

## 2015-01-12 LAB — URINE MICROSCOPIC-ADD ON

## 2015-01-12 LAB — POC URINE PREG, ED: Preg Test, Ur: NEGATIVE

## 2015-01-12 LAB — LIPASE, BLOOD: Lipase: 24 U/L (ref 22–51)

## 2015-01-12 MED ORDER — OXYCODONE-ACETAMINOPHEN 5-325 MG PO TABS
2.0000 | ORAL_TABLET | Freq: Once | ORAL | Status: AC
  Administered 2015-01-12: 2 via ORAL
  Filled 2015-01-12: qty 2

## 2015-01-12 NOTE — ED Provider Notes (Signed)
CSN: [id number]     Arrival date & time 01/12/15  1825 History   First MD Initiated Contact with Patient 01/12/15 2203     Chief Complaint  Patient presents with  . Abdominal Pain     (Consider location/radiation/quality/duration/timing/severity/associated sxs/prior Treatment) The history is provided by the patient and medical records. No language interpreter was used.     Leah [name] is a 19 y.o. female  with a hx of anxiety, cholecystectomy, pseudoseizures, anemia, GERD presents to the Emergency Department complaining of sudden, persistent, progressively worsening epigastric abdominal pain onset 4:30pm today.  Pt reports the pain is sharp, rated at a 8/10, and radiating to the right mid quadrant. Associated symptoms include loose stool x1 today without melena or hematochezia.  Nothing makes it better and turning and moving makes it worse.  Pt denies fever, chills, headache, neck pain, chest pain, SOB, N/V, weakness, dizziness, syncope, dysuria.  Pt denies abd surgery aside from her cholecystectomy.      Past Medical History  Diagnosis Date  . Anxiety 2011    no meds during pregnancy  . 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 Gall stones   . Anemia 2013  . GERD (gastroesophageal reflux disease)    Past Surgical History  Procedure Laterality Date  . Wisdom tooth extraction  2013  . Direct laryngoscopy N/A 04/24/2014    Procedure: DIRECT LARYNGOSCOPY;  Surgeon: [name], MD;  Location: [facility] OR;  Service: ENT;  Laterality: N/A;  . Foreign body removal esophageal N/A 04/24/2014    Procedure: REMOVAL FOREIGN BODY ESOPHAGEAL;  Surgeon: [name], MD;  Location: [facility] OR;  Service: ENT;  Laterality: N/A;  . Cholecystectomy N/A 09/28/2014    Procedure: LAPAROSCOPIC CHOLECYSTECTOMY;  Surgeon: Axel Filler, MD;  Location: [facility] OR;  Service: General;  Laterality: N/A;   Family History  Problem Relation Age of Onset  . Arthritis Mother     . Bronchitis Mother   . Asthma Mother   . Hearing loss Paternal Grandfather   . Cancer Neg Hx   . Diabetes Neg Hx    History  Substance Use Topics  . Smoking status: Never Smoker   . Smokeless tobacco: Never Used  . Alcohol Use: No   OB History    Gravida Para Term Preterm AB TAB SAB Ectopic Multiple Living   1 1 1  0 0 0 0 0 0 1     Review of Systems  Constitutional: Negative for fever, diaphoresis, appetite change, fatigue and unexpected weight change.  HENT: Negative for mouth sores and trouble swallowing.   Respiratory: Negative for cough, chest tightness, shortness of breath, wheezing and stridor.   Cardiovascular: Negative for chest pain and palpitations.  Gastrointestinal: Positive for abdominal pain. Negative for nausea, vomiting, diarrhea, constipation, blood in stool, abdominal distention and rectal pain.  Genitourinary: Negative for dysuria, urgency, frequency, hematuria, flank pain and difficulty urinating.  Musculoskeletal: Negative for back pain, neck pain and neck stiffness.  Skin: Negative for rash.  Neurological: Negative for weakness.  Hematological: Negative for adenopathy.  Psychiatric/Behavioral: Negative for confusion.  All other systems reviewed and are negative.     Allergies  Review of patient's allergies indicates no known allergies.  Home Medications   Prior to Admission medications   Medication Sig Start Date End Date Taking? Authorizing Provider  BIOTIN PO Take 1 tablet by mouth daily.   Yes Historical  Provider, MD  fluconazole (DIFLUCAN) 150 MG tablet Take 1 tablet (150 mg total) by mouth once. Patient not taking: Reported on 01/12/2015 11/25/14   Rachelle A Denney, CNM  metroNIDAZOLE (FLAGYL) 500 MG tablet Take 1 tablet (500 mg total) by mouth 2 (two) times daily. Patient not taking: Reported on 01/12/2015 11/25/14   Rachelle A Denney, CNM   BP 119/65 mmHg  Pulse 71  Temp(Src) 98.5 F (36.9 C) (Oral)  Resp 16  SpO2 100%  LMP  Physical  Exam  Constitutional: She appears well-developed and well-nourished. No distress.  Awake, alert, nontoxic appearance  HENT:  Head: Normocephalic and atraumatic.  Mouth/Throat: Oropharynx is clear and moist. No oropharyngeal exudate.  Eyes: Conjunctivae are normal. No scleral icterus.  Neck: Normal range of motion. Neck supple.  Cardiovascular: Normal rate, regular rhythm, normal heart sounds and intact distal pulses.   No murmur heard. Pulmonary/Chest: Effort normal and breath sounds normal. No respiratory distress. She has no wheezes.  Equal chest expansion  Abdominal: Soft. Bowel sounds are normal. She exhibits no distension and no mass. There is tenderness in the periumbilical area. There is no rebound, no guarding and no CVA tenderness. Hernia confirmed negative in the right inguinal area and confirmed negative in the left inguinal area.    Genitourinary: Uterus normal. No labial fusion. There is no rash, tenderness or lesion on the right labia. There is no rash, tenderness or lesion on the left labia. Uterus is not deviated, not enlarged, not fixed and not tender. Cervix exhibits no motion tenderness, no discharge and no friability. Right adnexum displays no mass, no tenderness and no fullness. Left adnexum displays no mass, no tenderness and no fullness. No erythema, tenderness or bleeding in the vagina. No foreign body around the vagina. No signs of injury around the vagina. Vaginal discharge (minimal, white) found.  Musculoskeletal: Normal range of motion. She exhibits no edema.  Lymphadenopathy:       Right: No inguinal adenopathy present.       Left: No inguinal adenopathy present.  Neurological: She is alert.  Speech is clear and goal oriented Moves extremities without ataxia  Skin: Skin is warm and dry. She is not diaphoretic. No erythema.  Psychiatric: She has a normal mood and affect.  Nursing note and vitals reviewed.   ED Course  Procedures (including critical care  time) Labs Review Labs Reviewed  WET PREP, GENITAL - Abnormal; Notable for the following:    Clue Cells Wet Prep HPF POC MODERATE (*)    All other components within normal limits  URINALYSIS, ROUTINE W REFLEX MICROSCOPIC (NOT AT [facility]) - Abnormal; Notable for the following:    Leukocytes, UA TRACE (*)    All other components within normal limits  URINE MICROSCOPIC-ADD ON - Abnormal; Notable for the following:    Squamous Epithelial / LPF FEW (*)    Bacteria, UA FEW (*)    All other components within normal limits  CBC WITH DIFFERENTIAL/PLATELET  COMPREHENSIVE METABOLIC PANEL  LIPASE, BLOOD  POC URINE PREG, ED  GC/CHLAMYDIA PROBE AMP ([place]) NOT AT [facility]    Imaging Review No results found.   EKG Interpretation None      MDM   Final diagnoses:  Periumbilical abdominal pain  Periumbilical abdominal pain    Leah [name] presents with several hours of anorexia, one episode of loose stools and periumbilical abdominal pain.  Exam abdomen is soft without peritoneal signs however patient has tenderness the periumbilical region and up into  the left quadrant.  Labs reassuring and no evidence of urinary tract infection. Will give pain control, perform pelvic exam and obtain CT scan.   12:22 AM Pelvic exam without CMT or foul smelling discharge.    1:15 AM CT pending.  Case discussed with Dr. Wilkie Aye who will follow CT and dispo accordingly.  Anticipate d/c home with symptom control including Miralax.    BP 119/65 mmHg  Pulse 71  Temp(Src) 98.5 F (36.9 C) (Oral)  Resp 16  SpO2 100%  LMP    Dierdre Forth, PA-C 01/13/15 1610  Pricilla Loveless, MD 01/17/15 1439

## 2015-01-12 NOTE — ED Notes (Signed)
Pt states that she began having upper abd pain around 4:30 pm; pt states that the pain radiates from above umbilicus down to the rt mid quad; pt denies N/V/D

## 2015-01-12 NOTE — ED Notes (Signed)
Two unsuccessful IV attempts by this writer.  

## 2015-01-12 NOTE — Telephone Encounter (Signed)
Patient is interested in scheduling a Mirena IUD insertion. Patient states she is currently on Depo and is due for her next injection between January 12, 2015 and January 26, 2015. Patient states she has not been sexually active in 4 months. Patient has been scheduled for 01-17-15 @ 10:30 am.

## 2015-01-13 LAB — GC/CHLAMYDIA PROBE AMP (~~LOC~~) NOT AT ARMC
Chlamydia: NEGATIVE
NEISSERIA GONORRHEA: NEGATIVE

## 2015-01-13 MED ORDER — POLYETHYLENE GLYCOL 3350 17 G PO PACK: 17.0000 g | PACK | Freq: Every day | ORAL | Status: DC

## 2015-01-13 MED ORDER — IOHEXOL 300 MG/ML  SOLN
100.0000 mL | Freq: Once | INTRAMUSCULAR | Status: AC | PRN
  Administered 2015-01-13: 80 mL via INTRAVENOUS

## 2015-01-13 MED ORDER — IOHEXOL 300 MG/ML  SOLN
25.0000 mL | Freq: Once | INTRAMUSCULAR | Status: AC | PRN
  Administered 2015-01-13: 25 mL via ORAL

## 2015-01-13 NOTE — Discharge Instructions (Signed)
1. Medications: Miralax, usual home medications 2. Treatment: rest, drink plenty of fluids, advance diet slowly 3. Follow Up: Please followup with your primary doctor in 2 days for discussion of your diagnoses and further evaluation after today's visit; if you do not have a primary care doctor use the resource guide provided to find one; Please return to the ER for persistent vomiting, high fevers or worsening symptoms    Abdominal Pain, Women Abdominal (stomach, pelvic, or belly) pain can be caused by many things. It is important to tell your doctor:  The location of the pain.  Does it come and go or is it present all the time?  Are there things that start the pain (eating certain foods, exercise)?  Are there other symptoms associated with the pain (fever, nausea, vomiting, diarrhea)? All of this is helpful to know when trying to find the cause of the pain. CAUSES   Stomach: virus or bacteria infection, or ulcer.  Intestine: appendicitis (inflamed appendix), regional ileitis (Crohn's disease), ulcerative colitis (inflamed colon), irritable bowel syndrome, diverticulitis (inflamed diverticulum of the colon), or cancer of the stomach or intestine.  Gallbladder disease or stones in the gallbladder.  Kidney disease, kidney stones, or infection.  Pancreas infection or cancer.  Fibromyalgia (pain disorder).  Diseases of the female organs:  Uterus: fibroid (non-cancerous) tumors or infection.  Fallopian tubes: infection or tubal pregnancy.  Ovary: cysts or tumors.  Pelvic adhesions (scar tissue).  Endometriosis (uterus lining tissue growing in the pelvis and on the pelvic organs).  Pelvic congestion syndrome (female organs filling up with blood just before the menstrual period).  Pain with the menstrual period.  Pain with ovulation (producing an egg).  Pain with an IUD (intrauterine device, birth control) in the uterus.  Cancer of the female organs.  Functional pain  (pain not caused by a disease, may improve without treatment).  Psychological pain.  Depression. DIAGNOSIS  Your doctor will decide the seriousness of your pain by doing an examination.  Blood tests.  X-rays.  Ultrasound.  CT scan (computed tomography, special type of X-ray).  MRI (magnetic resonance imaging).  Cultures, for infection.  Barium enema (dye inserted in the large intestine, to better view it with X-rays).  Colonoscopy (looking in intestine with a lighted tube).  Laparoscopy (minor surgery, looking in abdomen with a lighted tube).  Major abdominal exploratory surgery (looking in abdomen with a large incision). TREATMENT  The treatment will depend on the cause of the pain.   Many cases can be observed and treated at home.  Over-the-counter medicines recommended by your caregiver.  Prescription medicine.  Antibiotics, for infection.  Birth control pills, for painful periods or for ovulation pain.  Hormone treatment, for endometriosis.  Nerve blocking injections.  Physical therapy.  Antidepressants.  Counseling with a psychologist or psychiatrist.  Minor or major surgery. HOME CARE INSTRUCTIONS   Do not take laxatives, unless directed by your caregiver.  Take over-the-counter pain medicine only if ordered by your caregiver. Do not take aspirin because it can cause an upset stomach or bleeding.  Try a clear liquid diet (broth or water) as ordered by your caregiver. Slowly move to a bland diet, as tolerated, if the pain is related to the stomach or intestine.  Have a thermometer and take your temperature several times a day, and record it.  Bed rest and sleep, if it helps the pain.  Avoid sexual intercourse, if it causes pain.  Avoid stressful situations.  Keep your follow-up appointments and  tests, as your caregiver orders.  If the pain does not go away with medicine or surgery, you may try:  Acupuncture.  Relaxation exercises (yoga,  meditation).  Group therapy.  Counseling. SEEK MEDICAL CARE IF:   You notice certain foods cause stomach pain.  Your home care treatment is not helping your pain.  You need stronger pain medicine.  You want your IUD removed.  You feel faint or lightheaded.  You develop nausea and vomiting.  You develop a rash.  You are having side effects or an allergy to your medicine. SEEK IMMEDIATE MEDICAL CARE IF:   Your pain does not go away or gets worse.  You have a fever.  Your pain is felt only in portions of the abdomen. The right side could possibly be appendicitis. The left lower portion of the abdomen could be colitis or diverticulitis.  You are passing blood in your stools (bright red or black tarry stools, with or without vomiting).  You have blood in your urine.  You develop chills, with or without a fever.  You pass out. MAKE SURE YOU:   Understand these instructions.  Will watch your condition.  Will get help right away if you are not doing well or get worse. Document Released: 05/05/2007 Document Revised: 11/22/2013 Document Reviewed: 05/25/2009 [facility] Patient Information 2015 [place], [place]. This information is not intended to replace advice given to you by your health care provider. Make sure you discuss any questions you have with your health care provider.

## 2015-01-17 ENCOUNTER — Ambulatory Visit: Payer: Medicaid Other | Admitting: Certified Nurse Midwife

## 2015-01-18 ENCOUNTER — Ambulatory Visit: Payer: Medicaid Other

## 2015-03-03 ENCOUNTER — Encounter (HOSPITAL_COMMUNITY): Payer: Self-pay | Admitting: Emergency Medicine

## 2015-03-03 ENCOUNTER — Emergency Department (HOSPITAL_COMMUNITY)
Admission: EM | Admit: 2015-03-03 | Discharge: 2015-03-03 | Disposition: A | Discharge status: Home / Self Care | Payer: Medicaid Other | Attending: Emergency Medicine | Admitting: Emergency Medicine

## 2015-03-03 DIAGNOSIS — M791 Myalgia, unspecified site: Secondary | ICD-10-CM

## 2015-03-03 DIAGNOSIS — Z8659 Personal history of other mental and behavioral disorders: Secondary | ICD-10-CM | POA: Diagnosis not present

## 2015-03-03 DIAGNOSIS — Z79899 Other long term (current) drug therapy: Secondary | ICD-10-CM | POA: Insufficient documentation

## 2015-03-03 DIAGNOSIS — Z862 Personal history of diseases of the blood and blood-forming organs and certain disorders involving the immune mechanism: Secondary | ICD-10-CM | POA: Insufficient documentation

## 2015-03-03 DIAGNOSIS — J069 Acute upper respiratory infection, unspecified: Secondary | ICD-10-CM | POA: Diagnosis not present

## 2015-03-03 DIAGNOSIS — Z8719 Personal history of other diseases of the digestive system: Secondary | ICD-10-CM | POA: Diagnosis not present

## 2015-03-03 DIAGNOSIS — J029 Acute pharyngitis, unspecified: Secondary | ICD-10-CM | POA: Diagnosis present

## 2015-03-03 LAB — URINALYSIS, ROUTINE W REFLEX MICROSCOPIC
Bilirubin Urine: NEGATIVE
GLUCOSE, UA: NEGATIVE mg/dL
HGB URINE DIPSTICK: NEGATIVE
Ketones, ur: NEGATIVE mg/dL
Leukocytes, UA: NEGATIVE
Nitrite: NEGATIVE
PROTEIN: NEGATIVE mg/dL
Specific Gravity, Urine: >1.046 — ABNORMAL HIGH (ref 1.005–1.030)
Urobilinogen, UA: 1 mg/dL (ref 0.0–1.0)
pH: 5.5 (ref 5.0–8.0)

## 2015-03-03 LAB — URINE MICROSCOPIC-ADD ON

## 2015-03-03 MED ORDER — IBUPROFEN 600 MG PO TABS: 600.0000 mg | ORAL_TABLET | Freq: Four times a day (QID) | ORAL | Status: DC | PRN

## 2015-03-03 MED ORDER — IBUPROFEN 200 MG PO TABS
600.0000 mg | ORAL_TABLET | Freq: Once | ORAL | Status: AC
  Administered 2015-03-03: 600 mg via ORAL
  Filled 2015-03-03: qty 3

## 2015-03-03 NOTE — ED Provider Notes (Addendum)
CSN: [id number]     Arrival date & time 03/03/15  1909 History  This chart was scribed for non-physician practitioner Earley Favor, NP working with Richardean Canal, MD by Littie Deeds, ED Scribe. This patient was seen in room WTR6/WTR6 and the patient's care was started at 9:02 PM.      Chief Complaint  Patient presents with  . bodyaches   . Sore Throat   The history is provided by the patient. No language interpreter was used.   HPI Comments: Leah [name] is a 19 y.o. female who presents to the Emergency Department complaining of gradual onset myalgias that started yesterday. Patient also reports having associated headache, sore throat, and congestion. She had an episode of vomiting yesterday. She has been taking Mucinex and Tylenol but without relief. Patient denies fever, nausea, and diarrhea. She also denies history of asthma.    Past Medical History  Diagnosis Date  . Anxiety 2011    no meds during pregnancy  . 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 Gall stones   . Anemia 2013  . GERD (gastroesophageal reflux disease)    Past Surgical History  Procedure Laterality Date  . Wisdom tooth extraction  2013  . Direct laryngoscopy N/A 04/24/2014    Procedure: DIRECT LARYNGOSCOPY;  Surgeon: [name], MD;  Location: [facility] OR;  Service: ENT;  Laterality: N/A;  . Foreign body removal esophageal N/A 04/24/2014    Procedure: REMOVAL FOREIGN BODY ESOPHAGEAL;  Surgeon: [name], MD;  Location: [facility] OR;  Service: ENT;  Laterality: N/A;  . Cholecystectomy N/A 09/28/2014    Procedure: LAPAROSCOPIC CHOLECYSTECTOMY;  Surgeon: Axel Filler, MD;  Location: [facility] OR;  Service: General;  Laterality: N/A;   Family History  Problem Relation Age of Onset  . Arthritis Mother   . Bronchitis Mother   . Asthma Mother   . Hearing loss Paternal Grandfather   . Cancer Neg Hx   . Diabetes Neg Hx    Social History  Substance Use Topics  . Smoking status:  Never Smoker   . Smokeless tobacco: Never Used  . Alcohol Use: No   OB History    Gravida Para Term Preterm AB TAB SAB Ectopic Multiple Living   0 0 0 0 0 0 1     Review of Systems    Allergies  Review of patient's allergies indicates no known allergies.  Home Medications   Prior to Admission medications   Medication Sig Start Date End Date Taking? Authorizing Provider  guaiFENesin (MUCINEX) 600 MG 12 hr tablet Take 1,200 mg by mouth 2 (two) times daily as needed for cough.   Yes Historical Provider, MD  guaiFENesin (ROBITUSSIN) 100 MG/5ML liquid Take 200 mg by mouth 3 (three) times daily as needed for cough.   Yes Historical Provider, MD  fluconazole (DIFLUCAN) 150 MG tablet Take 1 tablet (150 mg total) by mouth once. Patient not taking: Reported on 01/12/2015 11/25/14   Rachelle A Denney, CNM  ibuprofen (ADVIL,MOTRIN) 600 MG tablet Take 1 tablet (600 mg total) by mouth every 6 (six) hours as needed for mild pain or moderate pain. 03/03/15   Earley Favor, NP  metroNIDAZOLE (FLAGYL) 500 MG tablet Take 1 tablet (500 mg total) by mouth 2 (two) times daily. Patient not taking: Reported on 01/12/2015 11/25/14   Roe Coombs, CNM  polyethylene glycol (MIRALAX / GLYCOLAX)  packet Take 17 g by mouth daily. 01/13/15   Hannah Muthersbaugh, PA-C   BP 97/60 mmHg  Pulse 74  Temp(Src) 98.3 F (36.8 C) (Oral)  Resp 16  SpO2 100% Physical Exam  Constitutional: She is oriented to person, place, and time. She appears well-developed and well-nourished.  HENT:  Head: Normocephalic.  Eyes: Pupils are equal, round, and reactive to light.  Cardiovascular: Normal rate.   Pulmonary/Chest: Effort normal and breath sounds normal.  Abdominal: Soft.  Musculoskeletal: Normal range of motion.  Neurological: She is alert and oriented to person, place, and time.  Skin: Skin is warm. No rash noted. No pallor.  Nursing note and vitals reviewed.   ED Course  Procedures  DIAGNOSTIC STUDIES: Oxygen  Saturation is 100% on room air, normal by my interpretation.    COORDINATION OF CARE: 9:04 PM-Discussed treatment plan which includes ibuprofen and UA with patient/guardian at bedside and patient/guardian agreed to plan.    Labs Review Labs Reviewed  URINALYSIS, ROUTINE W REFLEX MICROSCOPIC (NOT AT [facility]) - Abnormal; Notable for the following:    Color, Urine AMBER (*)    APPearance TURBID (*)    Specific Gravity, Urine >1.046 (*)    All other components within normal limits  URINE MICROSCOPIC-ADD ON - Abnormal; Notable for the following:    Squamous Epithelial / LPF FEW (*)    All other components within normal limits    Imaging Review No results found. I personally reviewed and evaluated these images and lab results as part of my medical decision-making.   EKG Interpretation None      MDM   Final diagnoses:  URI (upper respiratory infection)  Myalgia    I personally performed the services described in this documentation, which was scribed in my presence. The recorded information has been reviewed and is accurate.   Earley Favor, NP 03/03/15 2223  Richardean Canal, MD 03/03/15 2315  Earley Favor, NP 03/18/15 0157  Richardean Canal, MD 03/20/15 [phone]

## 2015-03-03 NOTE — ED Notes (Signed)
Pt from home c/o headache, body aches, sore throat, and stuffy nose since yesterday. Pt reports using mucinex, and cold relief medication with out relief.

## 2015-03-03 NOTE — ED Notes (Signed)
AVS explained in detail. Given heat packs. Advised to follow up with PCP. No other c/c.

## 2015-04-13 ENCOUNTER — Encounter (HOSPITAL_COMMUNITY): Payer: Self-pay | Admitting: Emergency Medicine

## 2015-04-13 ENCOUNTER — Emergency Department (HOSPITAL_COMMUNITY)
Admission: EM | Admit: 2015-04-13 | Discharge: 2015-04-13 | Disposition: A | Discharge status: Home / Self Care | Payer: Medicaid Other | Attending: Emergency Medicine | Admitting: Emergency Medicine

## 2015-04-13 DIAGNOSIS — Z862 Personal history of diseases of the blood and blood-forming organs and certain disorders involving the immune mechanism: Secondary | ICD-10-CM | POA: Insufficient documentation

## 2015-04-13 DIAGNOSIS — Z3202 Encounter for pregnancy test, result negative: Secondary | ICD-10-CM | POA: Insufficient documentation

## 2015-04-13 DIAGNOSIS — Z8669 Personal history of other diseases of the nervous system and sense organs: Secondary | ICD-10-CM | POA: Insufficient documentation

## 2015-04-13 DIAGNOSIS — Z8719 Personal history of other diseases of the digestive system: Secondary | ICD-10-CM | POA: Insufficient documentation

## 2015-04-13 DIAGNOSIS — B3731 Acute candidiasis of vulva and vagina: Secondary | ICD-10-CM

## 2015-04-13 DIAGNOSIS — Z8659 Personal history of other mental and behavioral disorders: Secondary | ICD-10-CM | POA: Insufficient documentation

## 2015-04-13 DIAGNOSIS — B373 Candidiasis of vulva and vagina: Secondary | ICD-10-CM | POA: Insufficient documentation

## 2015-04-13 LAB — I-STAT BETA HCG BLOOD, ED (MC, WL, AP ONLY)

## 2015-04-13 LAB — WET PREP, GENITAL
Trich, Wet Prep: NONE SEEN
Yeast Wet Prep HPF POC: NONE SEEN

## 2015-04-13 MED ORDER — FLUCONAZOLE 150 MG PO TABS: 150.0000 mg | ORAL_TABLET | Freq: Once | ORAL | Status: DC

## 2015-04-13 NOTE — ED Provider Notes (Signed)
CSN: [id number]     Arrival date & time 04/13/15  4098 History   First MD Initiated Contact with Patient 04/13/15 1109     Chief Complaint  Patient presents with  . Vaginal Discharge     (Consider location/radiation/quality/duration/timing/severity/associated sxs/prior Treatment) HPI Vaginal discharge this week. She reports is white. She reports itching. Patient thought that she had a yeast infection. She tried Monistat. The itching and the whitish discharge is worsening. She denies pelvic pain or back pain. Denies emergency urgency or dysuria. Patient denies probable STD Past Medical History  Diagnosis Date  . Anxiety 2011    no meds during pregnancy  . 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 Gall stones   . Anemia 2013  . GERD (gastroesophageal reflux disease)    Past Surgical History  Procedure Laterality Date  . Wisdom tooth extraction  2013  . Direct laryngoscopy N/A 04/24/2014    Procedure: DIRECT LARYNGOSCOPY;  Surgeon: [name], MD;  Location: [facility] OR;  Service: ENT;  Laterality: N/A;  . Foreign body removal esophageal N/A 04/24/2014    Procedure: REMOVAL FOREIGN BODY ESOPHAGEAL;  Surgeon: [name], MD;  Location: [facility] OR;  Service: ENT;  Laterality: N/A;  . Cholecystectomy N/A 09/28/2014    Procedure: LAPAROSCOPIC CHOLECYSTECTOMY;  Surgeon: Axel Filler, MD;  Location: [facility] OR;  Service: General;  Laterality: N/A;   Family History  Problem Relation Age of Onset  . Arthritis Mother   . Bronchitis Mother   . Asthma Mother   . Hearing loss Paternal Grandfather   . Cancer Neg Hx   . Diabetes Neg Hx    Social History  Substance Use Topics  . Smoking status: Never Smoker   . Smokeless tobacco: Never Used  . Alcohol Use: No   OB History    Gravida Para Term Preterm AB TAB SAB Ectopic Multiple Living   0 0 0 0 0 0 1     Review of Systems Constitutional: No fever no chills GI: No abdominal pain nausea or  vomiting.   Allergies  Review of patient's allergies indicates no known allergies.  Home Medications   Prior to Admission medications   Medication Sig Start Date End Date Taking? Authorizing Provider  acetaminophen (TYLENOL) 500 MG tablet Take 1,000 mg by mouth every 6 (six) hours as needed for headache.   Yes Historical Provider, MD  guaiFENesin (ROBITUSSIN) 100 MG/5ML liquid Take 200 mg by mouth 3 (three) times daily as needed for cough.   Yes Historical Provider, MD  miconazole (MONISTAT 7) 2 % vaginal cream Place 1 Applicatorful vaginally at bedtime.   Yes Historical Provider, MD  polyethylene glycol (MIRALAX / GLYCOLAX) packet Take 17 g by mouth daily. Patient taking differently: Take 17 g by mouth daily as needed (For constipation.).  01/13/15  Yes Hannah Muthersbaugh, PA-C  fluconazole (DIFLUCAN) 150 MG tablet Take 1 tablet (150 mg total) by mouth once. Patient not taking: Reported on 01/12/2015 11/25/14   Rachelle A Denney, CNM  fluconazole (DIFLUCAN) 150 MG tablet Take 1 tablet (150 mg total) by mouth once. 04/13/15   Arby Barrette, MD  ibuprofen (ADVIL,MOTRIN) 600 MG tablet Take 1 tablet (600 mg total) by mouth every 6 (six) hours as needed for mild pain or moderate pain. Patient not taking: Reported on 04/13/2015 03/03/15   Earley Favor, NP  metroNIDAZOLE (FLAGYL) 500 MG tablet Take 1  tablet (500 mg total) by mouth 2 (two) times daily. Patient not taking: Reported on 01/12/2015 11/25/14   Boykin Reaper A Denney, CNM   BP 96/59 mmHg  Pulse 69  Temp(Src) 98.5 F (36.9 C) (Oral)  Resp 16 Physical Exam  Constitutional: She is oriented to person, place, and time. She appears well-developed and well-nourished.  HENT:  Head: Normocephalic and atraumatic.  Eyes: EOM are normal. Pupils are equal, round, and reactive to light.  Neck: Neck supple.  Cardiovascular: Normal rate, regular rhythm, normal heart sounds and intact distal pulses.   Pulmonary/Chest: Effort normal and breath sounds normal.   Abdominal: Soft. Bowel sounds are normal. She exhibits no distension. There is no tenderness.  Genitourinary:  Normal axis internal vaginal exam no lesions. Speculum examination white, creamy material in the vaginal vault (consistent with Monistat as described by patient). Cervix is not friable no cervical discharge. Nontender. No adnexal tenderness.  Musculoskeletal: Normal range of motion. She exhibits no edema.  Neurological: She is alert and oriented to person, place, and time. She has normal strength. Coordination normal. GCS eye subscore is 4. GCS verbal subscore is 5. GCS motor subscore is 6.  Skin: Skin is warm, dry and intact.  Psychiatric: She has a normal mood and affect.    ED Course  Procedures (including critical care time) Labs Review Labs Reviewed  I-STAT BETA HCG BLOOD, ED ([facility], [facility], [facility] ONLY)    Imaging Review No results found. I have personally reviewed and evaluated these images and lab results as part of my medical decision-making.   EKG Interpretation None      MDM   Final diagnoses:  Yeast infection of the vagina   No abdominal pain, fever or other associated symptom. Currently no suggestion of acute cervicitis. Will be treated with Diflucan as OTC Monistat is not effective for the patient.    Arby Barrette, MD 04/13/15 [phone]

## 2015-04-13 NOTE — ED Notes (Signed)
Pt verbalized understanding d/c instructions and Rx.

## 2015-04-13 NOTE — ED Notes (Signed)
Per pt, states vaginal discharge that started last week-OTC med made symptoms worse

## 2015-04-13 NOTE — Discharge Instructions (Signed)

## 2015-04-14 LAB — GC/CHLAMYDIA PROBE AMP (~~LOC~~) NOT AT ARMC
Chlamydia: POSITIVE — AB
Neisseria Gonorrhea: NEGATIVE

## 2015-04-17 ENCOUNTER — Telehealth (HOSPITAL_COMMUNITY): Payer: Self-pay

## 2015-04-17 NOTE — Telephone Encounter (Signed)
Positive for chlamydia. Chart sent to EDP office for review.  

## 2015-04-19 ENCOUNTER — Telehealth (HOSPITAL_BASED_OUTPATIENT_CLINIC_OR_DEPARTMENT_OTHER): Payer: Self-pay | Admitting: Emergency Medicine

## 2015-04-19 NOTE — Telephone Encounter (Signed)
Post ED Visit - Positive Culture Follow-up: Successful Patient Follow-Up  Culture assessed and recommendations reviewed by:  Celedonio Miyamoto, Pharm.D., BCPS-AQ ID  Georgina Pillion, Pharm.D., BCPS  [place], [address].D., BCPS, AAHIVP  Estella Husk, Pharm.D., BCPS, AAHIVP  [employer], [address].D.  Tennis Must, Pharm.D.  Positive chalmydia culture   Patient discharged without antimicrobial prescription and treatment is now indicated  Organism is resistant to prescribed ED discharge antimicrobial  Patient with positive blood cultures  Changes discussed with ED Daisi Kentner: Dr. Gwendolyn Grant New antibiotic prescription Azithromycin 1 gram po once Called to [facility]  Contacted patient, 04/19/15 1110   Berle Mull 04/19/2015, 11:09 AM

## 2015-04-20 ENCOUNTER — Emergency Department (HOSPITAL_COMMUNITY)
Admission: EM | Admit: 2015-04-20 | Discharge: 2015-04-20 | Disposition: A | Discharge status: Home / Self Care | Payer: Medicaid Other | Attending: Emergency Medicine | Admitting: Emergency Medicine

## 2015-04-20 ENCOUNTER — Emergency Department (HOSPITAL_COMMUNITY): Payer: Medicaid Other

## 2015-04-20 ENCOUNTER — Encounter (HOSPITAL_COMMUNITY): Payer: Self-pay

## 2015-04-20 DIAGNOSIS — Z8659 Personal history of other mental and behavioral disorders: Secondary | ICD-10-CM | POA: Insufficient documentation

## 2015-04-20 DIAGNOSIS — R51 Headache: Secondary | ICD-10-CM | POA: Insufficient documentation

## 2015-04-20 DIAGNOSIS — Z87891 Personal history of nicotine dependence: Secondary | ICD-10-CM | POA: Insufficient documentation

## 2015-04-20 DIAGNOSIS — R519 Headache, unspecified: Secondary | ICD-10-CM

## 2015-04-20 DIAGNOSIS — Z862 Personal history of diseases of the blood and blood-forming organs and certain disorders involving the immune mechanism: Secondary | ICD-10-CM | POA: Insufficient documentation

## 2015-04-20 DIAGNOSIS — Z8619 Personal history of other infectious and parasitic diseases: Secondary | ICD-10-CM | POA: Insufficient documentation

## 2015-04-20 DIAGNOSIS — Z8719 Personal history of other diseases of the digestive system: Secondary | ICD-10-CM | POA: Insufficient documentation

## 2015-04-20 DIAGNOSIS — Z79899 Other long term (current) drug therapy: Secondary | ICD-10-CM | POA: Insufficient documentation

## 2015-04-20 HISTORY — DX: Chlamydial infection, unspecified: A74.9

## 2015-04-20 MED ORDER — METOCLOPRAMIDE HCL 5 MG/ML IJ SOLN
10.0000 mg | Freq: Once | INTRAMUSCULAR | Status: AC
  Administered 2015-04-20: 10 mg via INTRAVENOUS
  Filled 2015-04-20: qty 2

## 2015-04-20 MED ORDER — DIPHENHYDRAMINE HCL 50 MG/ML IJ SOLN
25.0000 mg | Freq: Once | INTRAMUSCULAR | Status: AC
  Administered 2015-04-20: 25 mg via INTRAVENOUS
  Filled 2015-04-20: qty 1

## 2015-04-20 MED ORDER — AZITHROMYCIN 500 MG PO TABS: 1000.0000 mg | ORAL_TABLET | Freq: Every day | ORAL | Status: DC

## 2015-04-20 MED ORDER — DEXAMETHASONE SODIUM PHOSPHATE 10 MG/ML IJ SOLN
10.0000 mg | Freq: Once | INTRAMUSCULAR | Status: AC
  Administered 2015-04-20: 10 mg via INTRAVENOUS
  Filled 2015-04-20: qty 1

## 2015-04-20 NOTE — ED Provider Notes (Signed)
CSN: [id number]     Arrival date & time 04/20/15  1217 History   First MD Initiated Contact with Patient 04/20/15 1226     Chief Complaint  Patient presents with  . Headache     (Consider location/radiation/quality/duration/timing/severity/associated sxs/prior Treatment) HPI Comments: Patient presents today with a frontal headache.  Headache has been constant for the past month.  She states that the headache never goes away.  She states that she has taken several OTC pain medications without relief.  Headache is unchanged.  She denies prior history of Migraines.  She denies vision changes, nausea, vomiting, neck pain/stiffness, fever, chills, dizziness, syncope, focal weakness, numbness, or tingling.    Patient is a 19 y.o. female presenting with headaches. The history is provided by the patient.  Headache   Past Medical History  Diagnosis Date  . Anxiety 2011    no meds during pregnancy  . 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 Gall stones   . Anemia 2013  . GERD (gastroesophageal reflux disease)   . Chlamydia    Past Surgical History  Procedure Laterality Date  . Wisdom tooth extraction  2013  . Direct laryngoscopy N/A 04/24/2014    Procedure: DIRECT LARYNGOSCOPY;  Surgeon: [name], MD;  Location: [facility] OR;  Service: ENT;  Laterality: N/A;  . Foreign body removal esophageal N/A 04/24/2014    Procedure: REMOVAL FOREIGN BODY ESOPHAGEAL;  Surgeon: [name], MD;  Location: [facility] OR;  Service: ENT;  Laterality: N/A;  . Cholecystectomy N/A 09/28/2014    Procedure: LAPAROSCOPIC CHOLECYSTECTOMY;  Surgeon: Axel Filler, MD;  Location: [facility] OR;  Service: General;  Laterality: N/A;   Family History  Problem Relation Age of Onset  . Arthritis Mother   . Bronchitis Mother   . Asthma Mother   . Hearing loss Paternal Grandfather   . Cancer Neg Hx   . Diabetes Neg Hx    Social History  Substance Use Topics  . Smoking status:  Former Games developer  . Smokeless tobacco: Never Used  . Alcohol Use: No   OB History    Gravida Para Term Preterm AB TAB SAB Ectopic Multiple Living   0 0 0 0 0 0 1     Review of Systems  Neurological: Positive for headaches.  All other systems reviewed and are negative.     Allergies  Review of patient's allergies indicates no known allergies.  Home Medications   Prior to Admission medications   Medication Sig Start Date End Date Taking? Authorizing Provider  acetaminophen (TYLENOL) 500 MG tablet Take 1,000 mg by mouth every 4 (four) hours as needed for headache.    Yes Historical Provider, MD  ibuprofen (ADVIL,MOTRIN) 200 MG tablet Take 400 mg by mouth every 4 (four) hours as needed for fever, headache, moderate pain or cramping.   Yes Historical Provider, MD  naproxen sodium (ANAPROX) 220 MG tablet Take 440 mg by mouth every 2 (two) hours as needed (for pain and headache).   Yes Historical Provider, MD  polyethylene glycol (MIRALAX / GLYCOLAX) packet Take 17 g by mouth daily. Patient taking differently: Take 17 g by mouth daily as needed (For constipation.).  01/13/15  Yes Hannah Muthersbaugh, PA-C  fluconazole (DIFLUCAN) 150 MG tablet Take 1 tablet (150 mg total) by mouth once. Patient not taking: Reported on 01/12/2015 11/25/14   Roe Coombs, CNM  fluconazole (DIFLUCAN)  150 MG tablet Take 1 tablet (150 mg total) by mouth once. Patient not taking: Reported on 04/20/2015 04/13/15   Arby Barrette, MD  ibuprofen (ADVIL,MOTRIN) 600 MG tablet Take 1 tablet (600 mg total) by mouth every 6 (six) hours as needed for mild pain or moderate pain. Patient not taking: Reported on 04/13/2015 03/03/15   Earley Favor, NP  metroNIDAZOLE (FLAGYL) 500 MG tablet Take 1 tablet (500 mg total) by mouth 2 (two) times daily. Patient not taking: Reported on 01/12/2015 11/25/14   Boykin Reaper A Denney, CNM   BP 111/76 mmHg  Pulse 86  Temp(Src) 98.4 F (36.9 C) (Oral)  Resp 17  SpO2 100%  LMP  Physical  Exam  Constitutional: She appears well-developed and well-nourished.  HENT:  Head: Normocephalic and atraumatic.  Mouth/Throat: Oropharynx is clear and moist.  Eyes: EOM are normal. Pupils are equal, round, and reactive to light.  Neck: Normal range of motion. Neck supple.  Cardiovascular: Normal rate and normal heart sounds.   Pulmonary/Chest: Effort normal and breath sounds normal.  Musculoskeletal: Normal range of motion.  Neurological: She is alert. She has normal strength. No cranial nerve deficit or sensory deficit. Coordination and gait normal.  Normal finger to nose testing Normal rapid alternating movements.  Skin: Skin is warm and dry.  Psychiatric: She has a normal mood and affect.  Nursing note and vitals reviewed.   ED Course  Procedures (including critical care time) Labs Review Labs Reviewed - No data to display  Imaging Review Ct Head Wo Contrast  04/20/2015   CLINICAL DATA:  Frontal headache for 1 month  EXAM: CT HEAD WITHOUT CONTRAST  TECHNIQUE: Contiguous axial images were obtained from the base of the skull through the vertex without intravenous contrast.  COMPARISON:  06/04/2010, brain MRI 06/18/2010  FINDINGS: No acute hemorrhage, infarct, or mass lesion is identified. No midline shift. Ventricles are normal in size. Orbits and paranasal sinuses are unremarkable. No skull fracture.  IMPRESSION: Normal exam.   Electronically Signed   By: Christiana Pellant M.D.   On: 04/20/2015 13:34   I have personally reviewed and evaluated these images and lab results as part of my medical decision-making.   EKG Interpretation None     2:39 PM Reassessed patient.  She reports improvement in her headache at this time. MDM   Final diagnoses:  None   Patient presents today with a headache that has been present for the past month.  Normal neurological exam.  Afebrile.  No nuchal rigidity.  CT head is negative.  Headache improved while in the ED.  Feel that the patient is  stable for discharge.  Return precautions given.    Santiago Glad, PA-C 04/20/15 1606  Mancel Bale, MD 04/24/15 [phone]

## 2015-04-20 NOTE — ED Notes (Signed)
Patient transported to CT 

## 2015-04-20 NOTE — ED Notes (Addendum)
Pt c/o constant headache x 1 month.  Pain score 8/10.  Pt reports taking OTC pain medication w/o relief.  Denies blurred vision and light sensitivity.  Also, Pt reports that she was prescribed Azithromycin yesterday for a STD and sts she vomited 20 mins after taking the medication.

## 2015-05-10 ENCOUNTER — Emergency Department (HOSPITAL_COMMUNITY)
Admission: EM | Admit: 2015-05-10 | Discharge: 2015-05-10 | Disposition: A | Discharge status: Home / Self Care | Payer: Medicaid Other | Attending: Emergency Medicine | Admitting: Emergency Medicine

## 2015-05-10 ENCOUNTER — Emergency Department (HOSPITAL_COMMUNITY): Payer: Medicaid Other

## 2015-05-10 ENCOUNTER — Encounter (HOSPITAL_COMMUNITY): Payer: Self-pay

## 2015-05-10 DIAGNOSIS — R079 Chest pain, unspecified: Secondary | ICD-10-CM | POA: Insufficient documentation

## 2015-05-10 DIAGNOSIS — B9789 Other viral agents as the cause of diseases classified elsewhere: Secondary | ICD-10-CM

## 2015-05-10 DIAGNOSIS — Z79899 Other long term (current) drug therapy: Secondary | ICD-10-CM | POA: Insufficient documentation

## 2015-05-10 DIAGNOSIS — J069 Acute upper respiratory infection, unspecified: Secondary | ICD-10-CM | POA: Insufficient documentation

## 2015-05-10 DIAGNOSIS — Z87891 Personal history of nicotine dependence: Secondary | ICD-10-CM | POA: Insufficient documentation

## 2015-05-10 DIAGNOSIS — R05 Cough: Secondary | ICD-10-CM

## 2015-05-10 DIAGNOSIS — Z8619 Personal history of other infectious and parasitic diseases: Secondary | ICD-10-CM | POA: Insufficient documentation

## 2015-05-10 DIAGNOSIS — R000 Tachycardia, unspecified: Secondary | ICD-10-CM | POA: Insufficient documentation

## 2015-05-10 DIAGNOSIS — R059 Cough, unspecified: Secondary | ICD-10-CM

## 2015-05-10 DIAGNOSIS — Z792 Long term (current) use of antibiotics: Secondary | ICD-10-CM | POA: Insufficient documentation

## 2015-05-10 DIAGNOSIS — Z862 Personal history of diseases of the blood and blood-forming organs and certain disorders involving the immune mechanism: Secondary | ICD-10-CM | POA: Insufficient documentation

## 2015-05-10 DIAGNOSIS — J040 Acute laryngitis: Secondary | ICD-10-CM

## 2015-05-10 DIAGNOSIS — J988 Other specified respiratory disorders: Secondary | ICD-10-CM

## 2015-05-10 DIAGNOSIS — Z8659 Personal history of other mental and behavioral disorders: Secondary | ICD-10-CM | POA: Insufficient documentation

## 2015-05-10 DIAGNOSIS — Z8719 Personal history of other diseases of the digestive system: Secondary | ICD-10-CM | POA: Insufficient documentation

## 2015-05-10 MED ORDER — ACETAMINOPHEN 325 MG PO TABS
650.0000 mg | ORAL_TABLET | Freq: Once | ORAL | Status: AC | PRN
  Administered 2015-05-10: 650 mg via ORAL
  Filled 2015-05-10: qty 2

## 2015-05-10 MED ORDER — NAPROXEN 500 MG PO TABS
500.0000 mg | ORAL_TABLET | Freq: Once | ORAL | Status: AC
  Administered 2015-05-10: 500 mg via ORAL
  Filled 2015-05-10: qty 1

## 2015-05-10 MED ORDER — ALBUTEROL SULFATE HFA 108 (90 BASE) MCG/ACT IN AERS
2.0000 | INHALATION_SPRAY | Freq: Once | RESPIRATORY_TRACT | Status: AC
  Administered 2015-05-10: 2 via RESPIRATORY_TRACT
  Filled 2015-05-10: qty 6.7

## 2015-05-10 MED ORDER — NAPROXEN 500 MG PO TABS: 500.0000 mg | ORAL_TABLET | Freq: Two times a day (BID) | ORAL | Status: DC

## 2015-05-10 MED ORDER — BENZONATATE 100 MG PO CAPS: 100.0000 mg | ORAL_CAPSULE | Freq: Three times a day (TID) | ORAL | Status: DC

## 2015-05-10 NOTE — ED Notes (Signed)
Per EMS, Pt, from home, c/o chest congestion, chest pain w/ deep breathing, cough, and headache x 5 days.  Pain score 10/10.  Pt reports taking OTC medication w/o relief.

## 2015-05-10 NOTE — ED Provider Notes (Signed)
CSN: [id number]     Arrival date & time 05/10/15  1439 History  By signing my name below, I, Placido Sou, attest that this documentation has been prepared under the direction and in the presence of Renne Crigler, [place]. Electronically Signed: Placido Sou, ED Scribe. 05/10/2015. 3:31 PM.   Chief Complaint  Patient presents with  . Chest Congestion   . Cough   The history is provided by the patient. No language interpreter was used.    HPI Comments: Leah [name] is a 19 y.o. female who presents to the Emergency Department by ambulance complaining of constant, moderate, chest and sinus congestion with onset 5 days ago. Pt notes associated, mild, CP that worsens when breathing, productive cough, wheezing, fever (TMAX 100.8) and sore throat. She denies any hx of chest or abd issues. Pt denies any hx of DM. She denies sick contacts. Pt denies ear pain.   Past Medical History  Diagnosis Date  . Anxiety 2011    no meds during pregnancy  . Seizure [facility]) 2011    pseudoseizures  . 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 Gall stones   . Anemia 2013  . GERD (gastroesophageal reflux disease)   . Chlamydia    Past Surgical History  Procedure Laterality Date  . Wisdom tooth extraction  2013  . Direct laryngoscopy N/A 04/24/2014    Procedure: DIRECT LARYNGOSCOPY;  Surgeon: [name], MD;  Location: [facility] OR;  Service: ENT;  Laterality: N/A;  . Foreign body removal esophageal N/A 04/24/2014    Procedure: REMOVAL FOREIGN BODY ESOPHAGEAL;  Surgeon: [name], MD;  Location: [facility] OR;  Service: ENT;  Laterality: N/A;  . Cholecystectomy N/A 09/28/2014    Procedure: LAPAROSCOPIC CHOLECYSTECTOMY;  Surgeon: Axel Filler, MD;  Location: [facility] OR;  Service: General;  Laterality: N/A;   Family History  Problem Relation Age of Onset  . Arthritis Mother   . Bronchitis Mother   . Asthma Mother   . Hearing loss Paternal Grandfather   . Cancer Neg Hx   . Diabetes Neg Hx    Social  History  Substance Use Topics  . Smoking status: Former Games developer  . Smokeless tobacco: Never Used  . Alcohol Use: No   OB History    Gravida Para Term Preterm AB TAB SAB Ectopic Multiple Living   0 0 0 0 0 0 1     Review of Systems  Constitutional: Positive for fever, chills and fatigue.  HENT: Positive for congestion, rhinorrhea, sore throat and voice change. Negative for ear pain and sinus pressure.   Eyes: Negative for redness.  Respiratory: Positive for cough and wheezing. Negative for shortness of breath.   Cardiovascular: Positive for chest pain. Negative for leg swelling.  Gastrointestinal: Negative for nausea, vomiting, abdominal pain and diarrhea.  Genitourinary: Negative for dysuria.  Musculoskeletal: Negative for myalgias and neck stiffness.  Skin: Negative for rash.  Neurological: Positive for headaches.  Hematological: Negative for adenopathy.   Allergies  Review of patient's allergies indicates no known allergies.  Home Medications   Prior to Admission medications   Medication Sig Start Date End Date Taking? Authorizing Provider  acetaminophen (TYLENOL) 500 MG tablet Take 1,000 mg by mouth every 4 (four) hours as needed for headache.     Historical Provider, MD  azithromycin (ZITHROMAX) 500 MG tablet Take 2 tablets (1,000 mg total) by mouth daily. 04/20/15   Santiago Glad, PA-C  fluconazole (DIFLUCAN) 150 MG tablet Take 1 tablet (150 mg total) by mouth once. Patient not taking: Reported on 01/12/2015 11/25/14   Rachelle A Denney, CNM  fluconazole (DIFLUCAN) 150 MG tablet Take 1 tablet (150 mg total) by mouth once. Patient not taking: Reported on 04/20/2015 04/13/15   Arby Barrette, MD  ibuprofen (ADVIL,MOTRIN) 200 MG tablet Take 400 mg by mouth every 4 (four) hours as needed for fever, headache, moderate pain or cramping.    Historical Provider, MD  ibuprofen (ADVIL,MOTRIN) 600 MG tablet Take 1 tablet (600 mg total) by mouth every 6 (six) hours as needed for mild  pain or moderate pain. Patient not taking: Reported on 04/13/2015 03/03/15   Earley Favor, NP  metroNIDAZOLE (FLAGYL) 500 MG tablet Take 1 tablet (500 mg total) by mouth 2 (two) times daily. Patient not taking: Reported on 01/12/2015 11/25/14   Roe Coombs, CNM  naproxen sodium (ANAPROX) 220 MG tablet Take 440 mg by mouth every 2 (two) hours as needed (for pain and headache).    Historical Provider, MD  polyethylene glycol (MIRALAX / GLYCOLAX) packet Take 17 g by mouth daily. Patient taking differently: Take 17 g by mouth daily as needed (For constipation.).  01/13/15   Hannah Muthersbaugh, PA-C   BP 119/63 mmHg  Pulse 108  Temp(Src) 100.8 F (38.2 C) (Oral)  Resp 16  SpO2 99%  LMP    Physical Exam  Constitutional: She appears well-developed and well-nourished.  HENT:  Head: Normocephalic and atraumatic.  Right Ear: Tympanic membrane, external ear and ear canal normal.  Left Ear: Tympanic membrane, external ear and ear canal normal.  Nose: Mucosal edema and rhinorrhea present.  Mouth/Throat: Uvula is midline and mucous membranes are normal. Mucous membranes are not dry. No oral lesions. No trismus in the jaw. No uvula swelling. Posterior oropharyngeal erythema present. No oropharyngeal exudate, posterior oropharyngeal edema or tonsillar abscesses.  Eyes: Conjunctivae are normal. Right eye exhibits no discharge. Left eye exhibits no discharge.  Neck: Normal range of motion. Neck supple.  Full range of motion in all 6 directions without pain. Patient with hoarse voice consistent with pharyngitis.  Cardiovascular: Regular rhythm and normal heart sounds.  Tachycardia present.   No murmur heard. Mild tachycardia  Pulmonary/Chest: Effort normal and breath sounds normal. No respiratory distress. She has no wheezes. She has no rales.  Abdominal: Soft. There is no tenderness.  Lymphadenopathy:    She has no cervical adenopathy.  Neurological: She is alert.  Skin: Skin is warm and dry.   Psychiatric: She has a normal mood and affect.  Nursing note and vitals reviewed.   ED Course  Procedures  DIAGNOSTIC STUDIES: Oxygen Saturation is 100% on RA, normal by my interpretation.    COORDINATION OF CARE: 3:21 PM Discussed treatment plan with pt at bedside including 1x tylenol and rx's for albuterol inhaler, naproxen, and tessalon. Pt agreed to plan.  Labs Review Labs Reviewed - No data to display  Imaging Review Dg Chest 2 View  05/10/2015  CLINICAL DATA:  Shortness of breath for 5 days.  Body aches. EXAM: CHEST  2 VIEW COMPARISON:  08/18/2014 FINDINGS: The heart size and mediastinal contours are within normal limits. Both lungs are clear. The visualized skeletal structures are unremarkable. IMPRESSION: No active cardiopulmonary disease. Electronically Signed   By: Elige Ko   On: 05/10/2015 15:10     EKG Interpretation None       3:37 PM Patient seen and examined. Medications ordered.   Vital  signs reviewed and are as follows: BP 119/63 mmHg  Pulse 108  Temp(Src) 100.8 F (38.2 C) (Oral)  Resp 16  SpO2 99%  LMP   Result chest x-ray reviewed with patient. Will give inhaler for home.  Patient counseled on supportive care for viral URI and s/s to return including worsening symptoms, persistent fever, persistent vomiting, or if they have any other concerns. Urged to see PCP if symptoms persist for more than 3 days. Patient verbalizes understanding and agrees with plan.    MDM   Final diagnoses:  Laryngitis  Cough  Viral respiratory infection   Patient with clinical signs symptoms consistent with laryngitis. She has no respiratory distress. Low-grade fever with mild tachycardia. Chest x-ray does not demonstrate any pneumonia. Patient has full range of motion in neck and I do not suspect a deep space infection in neck. She appears well, nontoxic. Tolerating orals. Suspect that her chest pain is due to bronchitis/muscle pain from coughing. No concern for PE  given consolation of symptoms and lack of risk factors.  I personally performed the services described in this documentation, which was scribed in my presence. The recorded information has been reviewed and is accurate.     Renne CriglerJoshua Prentice Sackrider, PA-C 05/10/15 1541  Leta BaptistEmily Roe Nguyen, MD 05/11/15 [phone]

## 2015-05-10 NOTE — Discharge Instructions (Signed)
Please read and follow all provided instructions.  Your diagnoses today include:  1. Laryngitis   2. Cough   3. Viral respiratory infection    You appear to have an upper respiratory infection (URI). An upper respiratory tract infection, or cold, is a viral infection of the air passages leading to the lungs. It should improve gradually after 5-7 days. You may have a lingering cough that lasts for 2- 4 weeks after the infection.  Tests performed today include:  Vital signs. See below for your results today.   Medications prescribed:   Albuterol inhaler - medication that opens up your airway  Use inhaler as follows: 1-2 puffs with spacer every 4 hours as needed for wheezing, cough, or shortness of breath.    Naproxen - anti-inflammatory pain medication  Do not exceed 500mg  naproxen every 12 hours, take with food  You have been prescribed an anti-inflammatory medication or NSAID. Take with food. Take smallest effective dose for the shortest duration needed for your pain. Stop taking if you experience stomach pain or vomiting.    Tessalon Perles - cough suppressant medication  Take any prescribed medications only as directed. Treatment for your infection is aimed at treating the symptoms. There are no medications, such as antibiotics, that will cure your infection.   Home care instructions:  Follow any educational materials contained in this packet.   Your illness is contagious and can be spread to others, especially during the first 3 or 4 days. It cannot be cured by antibiotics or other medicines. Take basic precautions such as washing your hands often, covering your mouth when you cough or sneeze, and avoiding public places where you could spread your illness to others.   Please continue drinking plenty of fluids.  Use over-the-counter medicines as needed as directed on packaging for symptom relief.  You may also use ibuprofen or tylenol as directed on packaging for pain or fever.   Do not take multiple medicines containing Tylenol or acetaminophen to avoid taking too much of this medication.  Follow-up instructions: Please follow-up with your primary care provider in the next 3 days for further evaluation of your symptoms if you are not feeling better.   Return instructions:   Please return to the Emergency Department if you experience worsening symptoms.   RETURN IMMEDIATELY IF you develop shortness of breath, confusion or altered mental status, a new rash, become dizzy, faint, or poorly responsive, or are unable to be cared for at home.  Please return if you have persistent vomiting and cannot keep down fluids or develop a fever that is not controlled by tylenol or motrin.    Please return if you have any other emergent concerns.  Additional Information:  Your vital signs today were: BP 119/63 mmHg   Pulse 108   Temp(Src) 100.8 F (38.2 C) (Oral)   Resp 16   SpO2 99%   LMP  If your blood pressure (BP) was elevated above 135/85 this visit, please have this repeated by your doctor within one month. --------------

## 2015-05-15 ENCOUNTER — Encounter (HOSPITAL_COMMUNITY): Payer: Self-pay | Admitting: Emergency Medicine

## 2015-05-15 ENCOUNTER — Emergency Department (HOSPITAL_COMMUNITY)
Admission: EM | Admit: 2015-05-15 | Discharge: 2015-05-16 | Disposition: A | Discharge status: Home / Self Care | Payer: Medicaid Other | Attending: Emergency Medicine | Admitting: Emergency Medicine

## 2015-05-15 DIAGNOSIS — Z87891 Personal history of nicotine dependence: Secondary | ICD-10-CM | POA: Insufficient documentation

## 2015-05-15 DIAGNOSIS — Z8719 Personal history of other diseases of the digestive system: Secondary | ICD-10-CM | POA: Insufficient documentation

## 2015-05-15 DIAGNOSIS — Z791 Long term (current) use of non-steroidal anti-inflammatories (NSAID): Secondary | ICD-10-CM | POA: Insufficient documentation

## 2015-05-15 DIAGNOSIS — N739 Female pelvic inflammatory disease, unspecified: Secondary | ICD-10-CM

## 2015-05-15 DIAGNOSIS — Z862 Personal history of diseases of the blood and blood-forming organs and certain disorders involving the immune mechanism: Secondary | ICD-10-CM | POA: Insufficient documentation

## 2015-05-15 DIAGNOSIS — Z8619 Personal history of other infectious and parasitic diseases: Secondary | ICD-10-CM | POA: Insufficient documentation

## 2015-05-15 DIAGNOSIS — Z8659 Personal history of other mental and behavioral disorders: Secondary | ICD-10-CM | POA: Insufficient documentation

## 2015-05-15 DIAGNOSIS — Z3202 Encounter for pregnancy test, result negative: Secondary | ICD-10-CM | POA: Insufficient documentation

## 2015-05-15 NOTE — ED Notes (Signed)
Patient c/o low abd pain and pain with voiding. Patient c/o nausea without emesis.

## 2015-05-16 LAB — URINALYSIS, ROUTINE W REFLEX MICROSCOPIC
Bilirubin Urine: NEGATIVE
Glucose, UA: NEGATIVE mg/dL
HGB URINE DIPSTICK: NEGATIVE
Ketones, ur: NEGATIVE mg/dL
LEUKOCYTES UA: NEGATIVE
Nitrite: NEGATIVE
PH: 5 (ref 5.0–8.0)
Protein, ur: NEGATIVE mg/dL
SPECIFIC GRAVITY, URINE: 1.027 (ref 1.005–1.030)
Urobilinogen, UA: 0.2 mg/dL (ref 0.0–1.0)

## 2015-05-16 LAB — URINE MICROSCOPIC-ADD ON

## 2015-05-16 LAB — GC/CHLAMYDIA PROBE AMP (~~LOC~~) NOT AT ARMC
Chlamydia: NEGATIVE
Neisseria Gonorrhea: NEGATIVE

## 2015-05-16 LAB — WET PREP, GENITAL
TRICH WET PREP: NONE SEEN
YEAST WET PREP: NONE SEEN

## 2015-05-16 LAB — PREGNANCY, URINE: Preg Test, Ur: NEGATIVE

## 2015-05-16 MED ORDER — HYDROCODONE-ACETAMINOPHEN 5-325 MG PO TABS
2.0000 | ORAL_TABLET | Freq: Once | ORAL | Status: AC
  Administered 2015-05-16: 2 via ORAL
  Filled 2015-05-16: qty 2

## 2015-05-16 MED ORDER — DOXYCYCLINE HYCLATE 100 MG PO CAPS: 100.0000 mg | ORAL_CAPSULE | Freq: Two times a day (BID) | ORAL | Status: DC

## 2015-05-16 MED ORDER — CEFTRIAXONE SODIUM 250 MG IJ SOLR
250.0000 mg | Freq: Once | INTRAMUSCULAR | Status: AC
  Administered 2015-05-16: 250 mg via INTRAMUSCULAR
  Filled 2015-05-16: qty 250

## 2015-05-16 MED ORDER — STERILE WATER FOR INJECTION IJ SOLN
INTRAMUSCULAR | Status: AC
  Administered 2015-05-16: 2.1 mL
  Filled 2015-05-16: qty 10

## 2015-05-16 MED ORDER — TRAMADOL HCL 50 MG PO TABS: 50.0000 mg | ORAL_TABLET | Freq: Four times a day (QID) | ORAL | Status: DC | PRN

## 2015-05-16 NOTE — Discharge Instructions (Signed)

## 2015-05-16 NOTE — ED Provider Notes (Signed)
CSN: [id number]     Arrival date & time 05/15/15  2332 History   First MD Initiated Contact with Patient 05/16/15 0201     Chief Complaint  Patient presents with  . Urinary Tract Infection     (Consider location/radiation/quality/duration/timing/severity/associated sxs/prior Treatment) HPI Comments: 19 year old female with a history of anxiety, pseudoseizures, and esophageal reflux presents to the emergency department for further evaluation of abdominal pain. Patient states that she has had suprapubic abdominal pain of the past 2 days. Pain is constant and worsening and associated with urinary frequency as well as nausea. Patient has taken Tylenol and naproxen for symptoms without relief. She denies associated fever, vomiting, diarrhea, vaginal bleeding, vaginal discharge, dysuria. Abdominal surgical history significant for cholecystectomy. She states her last bowel movement was 2 days ago. Last menstrual period was 2 years ago. Patient denies any birth control use currently. She is sexually active with one partner and denies the use of condoms. She denies concern for STDs.  Patient is a 19 y.o. female presenting with urinary tract infection. The history is provided by the patient. No language interpreter was used.  Urinary Tract Infection Associated symptoms: abdominal pain     Past Medical History  Diagnosis Date  . Anxiety 2011    no meds during pregnancy  . Seizure [facility]) 2011    pseudoseizures  . 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 Gall stones   . Anemia 2013  . GERD (gastroesophageal reflux disease)   . Chlamydia    Past Surgical History  Procedure Laterality Date  . Wisdom tooth extraction  2013  . Direct laryngoscopy N/A 04/24/2014    Procedure: DIRECT LARYNGOSCOPY;  Surgeon: [name], MD;  Location: [facility] OR;  Service: ENT;  Laterality: N/A;  . Foreign body removal esophageal N/A 04/24/2014    Procedure: REMOVAL FOREIGN BODY ESOPHAGEAL;   Surgeon: [name], MD;  Location: [facility] OR;  Service: ENT;  Laterality: N/A;  . Cholecystectomy N/A 09/28/2014    Procedure: LAPAROSCOPIC CHOLECYSTECTOMY;  Surgeon: Axel Filler, MD;  Location: [facility] OR;  Service: General;  Laterality: N/A;   Family History  Problem Relation Age of Onset  . Arthritis Mother   . Bronchitis Mother   . Asthma Mother   . Hearing loss Paternal Grandfather   . Cancer Neg Hx   . Diabetes Neg Hx    Social History  Substance Use Topics  . Smoking status: Former Games developer  . Smokeless tobacco: Never Used  . Alcohol Use: No   OB History    Gravida Para Term Preterm AB TAB SAB Ectopic Multiple Living   0 0 0 0 0 0 1      Review of Systems  Gastrointestinal: Positive for abdominal pain.  Genitourinary: Positive for frequency.  All other systems reviewed and are negative.   Allergies  Review of patient's allergies indicates no known allergies.  Home Medications   Prior to Admission medications   Medication Sig Start Date End Date Taking? Authorizing Provider  acetaminophen (TYLENOL) 500 MG tablet Take 1,000 mg by mouth every 4 (four) hours as needed for headache.    Yes Historical Provider, MD  benzonatate (TESSALON) 100 MG capsule Take 1 capsule (100 mg total) by mouth every 8 (eight) hours. 05/10/15  Yes Renne Crigler, PA-C  naproxen (NAPROSYN) 500 MG tablet Take 1 tablet (500 mg total) by mouth 2 (two) times daily. 05/10/15  Yes Renne Crigler, PA-C  azithromycin (ZITHROMAX) 500 MG tablet Take 2 tablets (1,000 mg total) by mouth daily. Patient not taking: Reported on 05/16/2015 04/20/15   Santiago Glad, PA-C  doxycycline (VIBRAMYCIN) 100 MG capsule Take 1 capsule (100 mg total) by mouth 2 (two) times daily. 05/16/15   Antony Madura, PA-C  traMADol (ULTRAM) 50 MG tablet Take 1 tablet (50 mg total) by mouth every 6 (six) hours as needed for severe pain. 05/16/15   Antony Madura, PA-C   BP 110/59 mmHg  Pulse 83  Temp(Src) 97.7 F (36.5 C) (Oral)  Resp  18  SpO2 100%   Physical Exam  Constitutional: She is oriented to person, place, and time. She appears well-developed and well-nourished. No distress.  Nontoxic/nonseptic appearing  HENT:  Head: Normocephalic and atraumatic.  Eyes: Conjunctivae and EOM are normal. No scleral icterus.  Neck: Normal range of motion.  Pulmonary/Chest: Effort normal. No respiratory distress. She has no wheezes.  Respirations even and unlabored  Abdominal: Soft. There is tenderness. There is guarding. There is no rebound.  Soft abdomen with tenderness to palpation in the suprapubic region. There is voluntary guarding. No masses or peritoneal signs.  Genitourinary: There is no rash, tenderness, lesion or injury on the right labia. There is no rash, tenderness, lesion or injury on the left labia. Uterus is tender. Cervix exhibits motion tenderness. Cervix exhibits no friability. Right adnexum displays no mass and no fullness. Left adnexum displays no mass and no fullness. No bleeding in the vagina. Vaginal discharge (Scant white, thick) found.  Musculoskeletal: Normal range of motion.  Neurological: She is alert and oriented to person, place, and time. She exhibits normal muscle tone. Coordination normal.  GCS 15. Patient moving all extremities.  Skin: Skin is warm and dry. No rash noted. She is not diaphoretic. No erythema. No pallor.  Psychiatric: She has a normal mood and affect. Her behavior is normal.  Nursing note and vitals reviewed.   ED Course  Procedures (including critical care time) Labs Review Labs Reviewed  WET PREP, GENITAL - Abnormal; Notable for the following:    Clue Cells Wet Prep HPF POC FEW (*)    WBC, Wet Prep HPF POC RARE (*)    All other components within normal limits  URINALYSIS, ROUTINE W REFLEX MICROSCOPIC (NOT AT [facility]) - Abnormal; Notable for the following:    APPearance TURBID (*)    All other components within normal limits  URINE MICROSCOPIC-ADD ON - Abnormal; Notable for  the following:    Squamous Epithelial / LPF FEW (*)    Bacteria, UA FEW (*)    All other components within normal limits  URINE CULTURE  PREGNANCY, URINE  GC/CHLAMYDIA PROBE AMP ([place]) NOT AT [facility]    Imaging Review No results found.   I have personally reviewed and evaluated these images and lab results as part of my medical decision-making.   EKG Interpretation None      MDM   Final diagnoses:  PID (pelvic inflammatory disease)    19 year old female presents to the emergency department for 2 days of suprapubic abdominal pain. Symptoms associated with urinary frequency without dysuria. Urinalysis today does not suggest infection. Pregnancy is negative. Patient does have cervical motion tenderness on GU exam. Patient has a history of chlamydia infection in July 2015. Wet prep during this encounter showed few white blood cells. Wet prep today with rare WBCs.   Given location of patient's symptoms, will cover for pelvic inflammatory disease with Rocephin and outpatient course of doxycycline.  Urine sent for culture. Gonorrhea and chlamydia cultures are pending. Doubt ovarian torsion given chronicity of symptoms. Also doubt appendicitis given lack of fever, nausea, and vomiting. No focal tenderness appreciated at McBurney's point. Return precautions given at discharge. Patient discharged in satisfactory condition; VSS.   Filed Vitals:   05/15/15 2339 05/16/15 0230 05/16/15 0348  BP: 100/61 98/59 110/59  Pulse: 79 77 83  Temp: 97.7 F (36.5 C)    TempSrc: Oral    Resp: 18 18 18   SpO2: 100% 99% 100%     Antony MaduraKelly Sharonna Vinje, PA-C 05/16/15 0456  Dione Boozeavid Glick, MD 05/16/15 [phone]

## 2015-05-17 LAB — URINE CULTURE

## 2015-07-03 ENCOUNTER — Emergency Department (HOSPITAL_COMMUNITY)
Admission: EM | Admit: 2015-07-03 | Discharge: 2015-07-04 | Disposition: A | Discharge status: Home / Self Care | Payer: Medicaid Other | Attending: Emergency Medicine | Admitting: Emergency Medicine

## 2015-07-03 ENCOUNTER — Emergency Department (HOSPITAL_COMMUNITY): Payer: Medicaid Other

## 2015-07-03 ENCOUNTER — Encounter (HOSPITAL_COMMUNITY): Payer: Self-pay

## 2015-07-03 DIAGNOSIS — N3000 Acute cystitis without hematuria: Secondary | ICD-10-CM | POA: Insufficient documentation

## 2015-07-03 DIAGNOSIS — Z3202 Encounter for pregnancy test, result negative: Secondary | ICD-10-CM | POA: Diagnosis not present

## 2015-07-03 DIAGNOSIS — Z791 Long term (current) use of non-steroidal anti-inflammatories (NSAID): Secondary | ICD-10-CM | POA: Diagnosis not present

## 2015-07-03 DIAGNOSIS — Z79899 Other long term (current) drug therapy: Secondary | ICD-10-CM | POA: Diagnosis not present

## 2015-07-03 DIAGNOSIS — R102 Pelvic and perineal pain: Secondary | ICD-10-CM

## 2015-07-03 DIAGNOSIS — Z87891 Personal history of nicotine dependence: Secondary | ICD-10-CM | POA: Diagnosis not present

## 2015-07-03 DIAGNOSIS — N760 Acute vaginitis: Secondary | ICD-10-CM | POA: Diagnosis not present

## 2015-07-03 DIAGNOSIS — B9689 Other specified bacterial agents as the cause of diseases classified elsewhere: Secondary | ICD-10-CM

## 2015-07-03 DIAGNOSIS — Z862 Personal history of diseases of the blood and blood-forming organs and certain disorders involving the immune mechanism: Secondary | ICD-10-CM | POA: Insufficient documentation

## 2015-07-03 DIAGNOSIS — F419 Anxiety disorder, unspecified: Secondary | ICD-10-CM | POA: Diagnosis not present

## 2015-07-03 DIAGNOSIS — Z8719 Personal history of other diseases of the digestive system: Secondary | ICD-10-CM | POA: Insufficient documentation

## 2015-07-03 DIAGNOSIS — Z8619 Personal history of other infectious and parasitic diseases: Secondary | ICD-10-CM | POA: Insufficient documentation

## 2015-07-03 LAB — CBC
HEMATOCRIT: 36.6 % (ref 36.0–46.0)
Hemoglobin: 11.9 g/dL — ABNORMAL LOW (ref 12.0–15.0)
MCH: 29.7 pg (ref 26.0–34.0)
MCHC: 32.5 g/dL (ref 30.0–36.0)
MCV: 91.3 fL (ref 78.0–100.0)
PLATELETS: 213 10*3/uL (ref 150–400)
RBC: 4.01 MIL/uL (ref 3.87–5.11)
RDW: 13.2 % (ref 11.5–15.5)
WBC: 5.6 10*3/uL (ref 4.0–10.5)

## 2015-07-03 LAB — COMPREHENSIVE METABOLIC PANEL
ALT: 15 U/L (ref 14–54)
AST: 18 U/L (ref 15–41)
Albumin: 4.3 g/dL (ref 3.5–5.0)
Alkaline Phosphatase: 64 U/L (ref 38–126)
Anion gap: 5 (ref 5–15)
BUN: 15 mg/dL (ref 6–20)
CHLORIDE: 107 mmol/L (ref 101–111)
CO2: 28 mmol/L (ref 22–32)
CREATININE: 0.63 mg/dL (ref 0.44–1.00)
Calcium: 9.3 mg/dL (ref 8.9–10.3)
GFR calc Af Amer: >60 mL/min (ref >60)
GFR calc non Af Amer: >60 mL/min (ref >60)
Glucose, Bld: 91 mg/dL (ref 65–99)
POTASSIUM: 3.8 mmol/L (ref 3.5–5.1)
SODIUM: 140 mmol/L (ref 135–145)
Total Bilirubin: 1.4 mg/dL — ABNORMAL HIGH (ref 0.3–1.2)
Total Protein: 7.4 g/dL (ref 6.5–8.1)

## 2015-07-03 LAB — URINE MICROSCOPIC-ADD ON: RBC / HPF: NONE SEEN RBC/hpf (ref 0–5)

## 2015-07-03 LAB — URINALYSIS, ROUTINE W REFLEX MICROSCOPIC
BILIRUBIN URINE: NEGATIVE
Glucose, UA: NEGATIVE mg/dL
Hgb urine dipstick: NEGATIVE
Ketones, ur: NEGATIVE mg/dL
NITRITE: POSITIVE — AB
Protein, ur: NEGATIVE mg/dL
SPECIFIC GRAVITY, URINE: 1.02 (ref 1.005–1.030)
pH: 6.5 (ref 5.0–8.0)

## 2015-07-03 LAB — WET PREP, GENITAL
Sperm: NONE SEEN
TRICH WET PREP: NONE SEEN
Yeast Wet Prep HPF POC: NONE SEEN

## 2015-07-03 LAB — LIPASE, BLOOD: LIPASE: 27 U/L (ref 11–51)

## 2015-07-03 LAB — POC URINE PREG, ED: Preg Test, Ur: NEGATIVE

## 2015-07-03 MED ORDER — KETOROLAC TROMETHAMINE 30 MG/ML IJ SOLN
30.0000 mg | Freq: Once | INTRAMUSCULAR | Status: AC
  Administered 2015-07-03: 30 mg via INTRAVENOUS
  Filled 2015-07-03: qty 1

## 2015-07-03 MED ORDER — IOHEXOL 300 MG/ML  SOLN
25.0000 mL | Freq: Once | INTRAMUSCULAR | Status: AC | PRN
  Administered 2015-07-03: 25 mL via ORAL

## 2015-07-03 MED ORDER — IOHEXOL 300 MG/ML  SOLN
100.0000 mL | Freq: Once | INTRAMUSCULAR | Status: AC | PRN
  Administered 2015-07-03: 100 mL via INTRAVENOUS

## 2015-07-03 NOTE — ED Notes (Signed)
Patient transported to CT 

## 2015-07-03 NOTE — ED Notes (Signed)
Pt presents with c/o abdominal pain and pelvic pain for approx one week. Pt reports she was seen here recently for same pain and given antibiotics and some pain meds. Pt reports she has some nausea, no vomiting.

## 2015-07-03 NOTE — ED Provider Notes (Signed)
CSN: [id number]     Arrival date & time 07/03/15  1627 History   First MD Initiated Contact with Patient 07/03/15 2139     Chief Complaint  Patient presents with  . Abdominal Pain  . Pelvic Pain     (Consider location/radiation/quality/duration/timing/severity/associated sxs/prior Treatment) HPI   Leah [name] is a 19 y.o. female, with a history of cholecystectomy, presenting to the ED with pelvic pain accompanied by nausea for the last month. Pt states the pain is sharp, constant, rates it 9/10, non-radiating. Pt was seen for this complaint on 05/16/15, was diagnosed with PID, given ABX and discharged home. Pt states she now has frequent urination and the pain has worsened. Pt denies fever/chills, abdominal pain, hematochezia, hematuria, dysuria, vaginal discharge or bleeding, or any other pain or complaints. Pt has not tried anything for the pain. Pt states her GC/Chlamydia labs came back negative.     Past Medical History  Diagnosis Date  . Anxiety 2011    no meds during pregnancy  . Seizure [facility]) 2011    pseudoseizures  . 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 Gall stones   . Anemia 2013  . GERD (gastroesophageal reflux disease)   . Chlamydia    Past Surgical History  Procedure Laterality Date  . Wisdom tooth extraction  2013  . Direct laryngoscopy N/A 04/24/2014    Procedure: DIRECT LARYNGOSCOPY;  Surgeon: [name], MD;  Location: [facility] OR;  Service: ENT;  Laterality: N/A;  . Foreign body removal esophageal N/A 04/24/2014    Procedure: REMOVAL FOREIGN BODY ESOPHAGEAL;  Surgeon: [name], MD;  Location: [facility] OR;  Service: ENT;  Laterality: N/A;  . Cholecystectomy N/A 09/28/2014    Procedure: LAPAROSCOPIC CHOLECYSTECTOMY;  Surgeon: Axel Filler, MD;  Location: [facility] OR;  Service: General;  Laterality: N/A;   Family History  Problem Relation Age of Onset  . Arthritis Mother   . Bronchitis Mother   . Asthma Mother   . Hearing loss Paternal  Grandfather   . Cancer Neg Hx   . Diabetes Neg Hx    Social History  Substance Use Topics  . Smoking status: Former Games developer  . Smokeless tobacco: Never Used  . Alcohol Use: No   OB History    Gravida Para Term Preterm AB TAB SAB Ectopic Multiple Living   1 1 1  0 0 0 0 0 0 1     Review of Systems  Constitutional: Negative for fever, chills and diaphoresis.  Gastrointestinal: Positive for nausea and vomiting. Negative for abdominal pain, diarrhea, constipation and blood in stool.  Genitourinary: Positive for urgency, frequency and pelvic pain. Negative for flank pain, vaginal bleeding, vaginal discharge and vaginal pain.  Neurological: Negative for dizziness, syncope, weakness and light-headedness.      Allergies  Review of patient's allergies indicates no known allergies.  Home Medications   Prior to Admission medications   Medication Sig Start Date End Date Taking? Authorizing Provider  naproxen (NAPROSYN) 500 MG tablet Take 1 tablet (500 mg total) by mouth 2 (two) times daily. 05/10/15  Yes Renne Crigler, PA-C  azithromycin (ZITHROMAX) 500 MG tablet Take 2 tablets (1,000 mg total) by mouth daily. Patient not taking: Reported on 05/16/2015 04/20/15   Santiago Glad, PA-C  benzonatate (TESSALON) 100 MG capsule Take 1 capsule (100 mg total) by mouth every 8 (eight) hours. Patient not taking: Reported on 07/03/2015 05/10/15   Renne Crigler, PA-C  doxycycline (  VIBRAMYCIN) 100 MG capsule Take 1 capsule (100 mg total) by mouth 2 (two) times daily. Patient not taking: Reported on 07/03/2015 05/16/15   Antony MaduraKelly Humes, PA-C  ibuprofen (ADVIL,MOTRIN) 800 MG tablet Take 1 tablet (800 mg total) by mouth 3 (three) times daily. 07/04/15   Shawn C Joy, PA-C  metroNIDAZOLE (FLAGYL) 500 MG tablet Take 1 tablet (500 mg total) by mouth 2 (two) times daily. 07/04/15   Shawn C Joy, PA-C  oxyCODONE-acetaminophen (PERCOCET/ROXICET) 5-325 MG tablet Take 2 tablets by mouth every 4 (four) hours as needed  for severe pain. 07/04/15   Shawn C Joy, PA-C  phenazopyridine (PYRIDIUM) 200 MG tablet Take 1 tablet (200 mg total) by mouth 3 (three) times daily. 07/04/15   Shawn C Joy, PA-C  sulfamethoxazole-trimethoprim (BACTRIM DS,SEPTRA DS) 800-160 MG tablet Take 1 tablet by mouth 2 (two) times daily. 07/04/15 07/11/15  Shawn C Joy, PA-C  traMADol (ULTRAM) 50 MG tablet Take 1 tablet (50 mg total) by mouth every 6 (six) hours as needed for severe pain. Patient not taking: Reported on 07/03/2015 05/16/15   Antony MaduraKelly Humes, PA-C   BP 117/80 mmHg  Pulse 74  Temp(Src) 97.8 F (36.6 C) (Oral)  Resp 18  SpO2 100% Physical Exam  Constitutional: She appears well-developed and well-nourished. No distress.  HENT:  Head: Normocephalic and atraumatic.  Eyes: Conjunctivae are normal. Pupils are equal, round, and reactive to light.  Cardiovascular: Normal rate, regular rhythm and normal heart sounds.   Pulmonary/Chest: Effort normal and breath sounds normal. No respiratory distress.  Abdominal: Soft. Bowel sounds are normal. There is tenderness in the suprapubic area. There is no CVA tenderness, no tenderness at McBurney's point and negative Murphy's sign.  Genitourinary: Rectum normal and uterus normal. Pelvic exam was performed with patient supine. There is no rash, tenderness or lesion on the right labia. There is no rash, tenderness or lesion on the left labia. Cervix exhibits discharge. Cervix exhibits no motion tenderness and no friability. Right adnexum displays no mass and no tenderness. Left adnexum displays tenderness. Left adnexum displays no mass.  External genitalia normal Vagina with discharge - moderate white discharge Cervix  normal negative for cervical motion tenderness Adnexa palpated, no masses or positive for tenderness noted on the left Bladder palpated negative for tenderness Uterus palpated no masses or negative for tenderness Med Tech, Megan, served as chaperone during exam.   Musculoskeletal: She exhibits no edema or tenderness.  Neurological: She is alert.  Skin: Skin is warm and dry. She is not diaphoretic.  Nursing note and vitals reviewed.   ED Course  Procedures (including critical care time) Labs Review Labs Reviewed  WET PREP, GENITAL - Abnormal; Notable for the following:    Clue Cells Wet Prep HPF POC PRESENT (*)    WBC, Wet Prep HPF POC MANY (*)    All other components within normal limits  COMPREHENSIVE METABOLIC PANEL - Abnormal; Notable for the following:    Total Bilirubin 1.4 (*)    All other components within normal limits  CBC - Abnormal; Notable for the following:    Hemoglobin 11.9 (*)    All other components within normal limits  URINALYSIS, ROUTINE W REFLEX MICROSCOPIC (NOT AT [facility]) - Abnormal; Notable for the following:    APPearance TURBID (*)    Nitrite POSITIVE (*)    Leukocytes, UA MODERATE (*)    All other components within normal limits  URINE MICROSCOPIC-ADD ON - Abnormal; Notable for the following:    Squamous Epithelial /  LPF 0-5 (*)    Bacteria, UA MANY (*)    All other components within normal limits  LIPASE, BLOOD  RPR  HIV ANTIBODY (ROUTINE TESTING)  POC URINE PREG, ED  GC/CHLAMYDIA PROBE AMP ([place]) NOT AT [facility]    Imaging Review US Transvaginal Non-ob  07/04/2015  CLINICAL DATA:  Pelvic pain for 1 week. Negative pregnancy test. Pain is greatest on the left. EXAM: TRANSABDOMINAL AND TRANSVAGINAL ULTRASOUND OF PELVIS TECHNIQUE: Both transabdominal and transvaginal ultrasound examinations of the pelvis were performed. Transabdominal technique was performed for global imaging of the pelvis including uterus, ovaries, adnexal regions, and pelvic cul-de-sac. It was necessary to proceed with endovaginal exam following the transabdominal exam to visualize the ovaries and endometrium. COMPARISON:  CT abdomen and pelvis 07/03/2015 FINDINGS: Uterus Measurements: 7.6 x 3.7 x 5.3 cm. Uterus is anteverted. No fibroids or  other mass visualized. Endometrium Thickness: 6 mm.  No focal abnormality visualized. Right ovary Measurements: 2.4 x 2 x 2.2 cm. Normal appearance/no adnexal mass. Left ovary Measurements: 2.9 x 2.1 x 2.2 cm. Normal appearance/no adnexal mass. Other findings No free fluid. IMPRESSION: Normal ultrasound appearance of the uterus and ovaries. Normal follicular changes. Electronically Signed   By: Burman Nieves M.D.   On: 07/04/2015 00:42   US Pelvis Complete  07/04/2015  CLINICAL DATA:  Pelvic pain for 1 week. Negative pregnancy test. Pain is greatest on the left. EXAM: TRANSABDOMINAL AND TRANSVAGINAL ULTRASOUND OF PELVIS TECHNIQUE: Both transabdominal and transvaginal ultrasound examinations of the pelvis were performed. Transabdominal technique was performed for global imaging of the pelvis including uterus, ovaries, adnexal regions, and pelvic cul-de-sac. It was necessary to proceed with endovaginal exam following the transabdominal exam to visualize the ovaries and endometrium. COMPARISON:  CT abdomen and pelvis 07/03/2015 FINDINGS: Uterus Measurements: 7.6 x 3.7 x 5.3 cm. Uterus is anteverted. No fibroids or other mass visualized. Endometrium Thickness: 6 mm.  No focal abnormality visualized. Right ovary Measurements: 2.4 x 2 x 2.2 cm. Normal appearance/no adnexal mass. Left ovary Measurements: 2.9 x 2.1 x 2.2 cm. Normal appearance/no adnexal mass. Other findings No free fluid. IMPRESSION: Normal ultrasound appearance of the uterus and ovaries. Normal follicular changes. Electronically Signed   By: Burman Nieves M.D.   On: 07/04/2015 00:42   Ct Abdomen Pelvis W Contrast  07/04/2015  CLINICAL DATA:  Initial evaluation for acute abdominal and pelvic pain for 1 week. EXAM: CT ABDOMEN AND PELVIS WITH CONTRAST TECHNIQUE: Multidetector CT imaging of the abdomen and pelvis was performed using the standard protocol following bolus administration of intravenous contrast. CONTRAST:  25mL OMNIPAQUE IOHEXOL 300  MG/ML SOLN, OMNIPAQUE IOHEXOL 300 MG/ML SOLN COMPARISON:  Prior CT from 01/13/2015. FINDINGS: Visualized lung bases are clear. The liver demonstrates a normal contrast enhanced appearance. Focal fat deposition noted adjacent to the fissure for ligamentum teres. Gallbladder is absent. Mild intrahepatic biliary dilatation, similar to prior, and likely related to post cholecystectomy changes. Spleen, adrenal glands, and pancreas demonstrate a normal contrast enhanced appearance. Kidneys are equal in size with symmetric enhancement. No nephrolithiasis, hydronephrosis, or focal enhancing renal mass. Stomach within normal limits. No evidence for bowel obstruction. No abnormal wall thickening, mucosal enhancement, or inflammatory fat stranding seen about the bowels. Colon is largely decompressed. Appendix visualized in the right lower quadrant and is of normal caliber and appearance without associated inflammatory changes to suggest acute appendicitis. Bladder within normal limits. Uterus and ovaries normal for patient age. Probable small physiologic cyst noted within the ovaries bilaterally,  the largest of which measures 18 mm on the left. Small volume free fluid within the pelvis, likely physiologic. No free air. Dx no pathologically enlarged intra-abdominal pelvic lymph nodes. Normal intravascular enhancement seen throughout the intra-abdominal aorta and its branch vessels. No acute osseous abnormality. No worrisome lytic or blastic osseous lesions. IMPRESSION: 1. No CT evidence for acute intra-abdominal or pelvic process. 2. Status post cholecystectomy. Electronically Signed   By: Rise Mu M.D.   On: 07/04/2015 00:12   I have personally reviewed and evaluated these images and lab results as part of my medical decision-making.   EKG Interpretation None      MDM   Final diagnoses:  Acute cystitis without hematuria  Bacterial vaginosis    Leah M Heyne presents with pelvic pain for the  last month.  Findings and plan of care discussed with Paula Libra, MD.  This patient has a UTI, however this would not explain all of her pain especially that found on the pelvic exam. Further investigation through the use of ultrasound is indicated. Wet prep indicates bacterial vaginosis. Once results of CT are received and are normal, patient will be discharged with antibiotics and instructions to follow-up with OB/GYN. No abnormalities were found on the CT or the ultrasound, to include torsion. Patient will need to be referred to OB/GYN outpatient. Patient will be given pain management and antibiotics. This plan of care was communicated with the patient, who agreed to the plan, and is comfortable with discharge.  Anselm Pancoast, PA-C 07/04/15 0059  Paula Libra, MD 07/04/15 [phone]

## 2015-07-04 LAB — RPR: RPR: NONREACTIVE

## 2015-07-04 LAB — HIV ANTIBODY (ROUTINE TESTING W REFLEX): HIV SCREEN 4TH GENERATION: NONREACTIVE

## 2015-07-04 MED ORDER — MORPHINE SULFATE (PF) 4 MG/ML IV SOLN
4.0000 mg | Freq: Once | INTRAVENOUS | Status: AC
  Administered 2015-07-04: 4 mg via INTRAVENOUS
  Filled 2015-07-04: qty 1

## 2015-07-04 MED ORDER — PHENAZOPYRIDINE HCL 200 MG PO TABS: 200.0000 mg | ORAL_TABLET | Freq: Three times a day (TID) | ORAL | Status: DC

## 2015-07-04 MED ORDER — SULFAMETHOXAZOLE-TRIMETHOPRIM 800-160 MG PO TABS: 1.0000 | ORAL_TABLET | Freq: Two times a day (BID) | ORAL | Status: AC

## 2015-07-04 MED ORDER — ONDANSETRON HCL 4 MG/2ML IJ SOLN
4.0000 mg | Freq: Once | INTRAMUSCULAR | Status: AC
  Administered 2015-07-04: 4 mg via INTRAVENOUS
  Filled 2015-07-04: qty 2

## 2015-07-04 MED ORDER — IBUPROFEN 800 MG PO TABS: 800.0000 mg | ORAL_TABLET | Freq: Three times a day (TID) | ORAL | Status: DC

## 2015-07-04 MED ORDER — OXYCODONE-ACETAMINOPHEN 5-325 MG PO TABS: 2.0000 | ORAL_TABLET | ORAL | Status: DC | PRN

## 2015-07-04 MED ORDER — METRONIDAZOLE 500 MG PO TABS: 500.0000 mg | ORAL_TABLET | Freq: Two times a day (BID) | ORAL | Status: DC

## 2015-07-04 NOTE — Discharge Instructions (Signed)
You have been seen today for pelvic pain. Your urinalysis indicates that you have a UTI. Follow up with PCP as needed. Return to ED should symptoms worsen. [facility]

## 2015-07-04 NOTE — ED Notes (Signed)
Patient was alert, oriented and stable upon discharge. RN went over AVS and patient had no further questions.  

## 2015-07-05 LAB — GC/CHLAMYDIA PROBE AMP (~~LOC~~) NOT AT ARMC
CHLAMYDIA, DNA PROBE: NEGATIVE
NEISSERIA GONORRHEA: NEGATIVE

## 2015-07-27 ENCOUNTER — Encounter: Payer: Self-pay | Admitting: Obstetrics & Gynecology

## 2015-08-03 DIAGNOSIS — R102 Pelvic and perineal pain: Secondary | ICD-10-CM | POA: Diagnosis not present

## 2015-08-03 DIAGNOSIS — R350 Frequency of micturition: Secondary | ICD-10-CM | POA: Insufficient documentation

## 2015-08-04 ENCOUNTER — Encounter (HOSPITAL_COMMUNITY): Payer: Self-pay | Admitting: Emergency Medicine

## 2015-08-04 ENCOUNTER — Emergency Department (HOSPITAL_COMMUNITY)
Admission: EM | Admit: 2015-08-04 | Discharge: 2015-08-04 | Discharge status: Against Medical Advice | Payer: Medicaid Other | Attending: Emergency Medicine | Admitting: Emergency Medicine

## 2015-08-04 LAB — URINALYSIS, ROUTINE W REFLEX MICROSCOPIC
Bilirubin Urine: NEGATIVE
Glucose, UA: NEGATIVE mg/dL
Hgb urine dipstick: NEGATIVE
Ketones, ur: NEGATIVE mg/dL
LEUKOCYTES UA: NEGATIVE
NITRITE: NEGATIVE
Protein, ur: NEGATIVE mg/dL
SPECIFIC GRAVITY, URINE: 1.029 (ref 1.005–1.030)
pH: 7 (ref 5.0–8.0)

## 2015-08-04 NOTE — ED Notes (Signed)
Patient presents for pelvic pain, white thick discharge and urinary frequency. Rates pain 9/10.

## 2015-08-04 NOTE — ED Notes (Signed)
Pt called for VS recheck, no answer when pt's name called

## 2015-08-04 NOTE — ED Notes (Signed)
Pt called for VS recheck and pt did not answer

## 2015-08-04 NOTE — ED Notes (Signed)
Pt called for VS recheck; no answer when pt's name called; 3rd call; pt eloped from waiting room

## 2015-10-10 ENCOUNTER — Encounter (HOSPITAL_COMMUNITY): Payer: Self-pay | Admitting: Emergency Medicine

## 2015-10-10 ENCOUNTER — Emergency Department (HOSPITAL_COMMUNITY)
Admission: EM | Admit: 2015-10-10 | Discharge: 2015-10-11 | Disposition: A | Discharge status: Home / Self Care | Payer: Medicaid Other | Attending: Emergency Medicine | Admitting: Emergency Medicine

## 2015-10-10 DIAGNOSIS — Z862 Personal history of diseases of the blood and blood-forming organs and certain disorders involving the immune mechanism: Secondary | ICD-10-CM | POA: Diagnosis not present

## 2015-10-10 DIAGNOSIS — Z8619 Personal history of other infectious and parasitic diseases: Secondary | ICD-10-CM | POA: Diagnosis not present

## 2015-10-10 DIAGNOSIS — Z87891 Personal history of nicotine dependence: Secondary | ICD-10-CM | POA: Diagnosis not present

## 2015-10-10 DIAGNOSIS — T798XXA Other early complications of trauma, initial encounter: Secondary | ICD-10-CM

## 2015-10-10 DIAGNOSIS — Z792 Long term (current) use of antibiotics: Secondary | ICD-10-CM | POA: Insufficient documentation

## 2015-10-10 DIAGNOSIS — L089 Local infection of the skin and subcutaneous tissue, unspecified: Secondary | ICD-10-CM | POA: Diagnosis not present

## 2015-10-10 DIAGNOSIS — F419 Anxiety disorder, unspecified: Secondary | ICD-10-CM | POA: Insufficient documentation

## 2015-10-10 DIAGNOSIS — Z791 Long term (current) use of non-steroidal anti-inflammatories (NSAID): Secondary | ICD-10-CM | POA: Diagnosis not present

## 2015-10-10 DIAGNOSIS — Z8719 Personal history of other diseases of the digestive system: Secondary | ICD-10-CM | POA: Insufficient documentation

## 2015-10-10 DIAGNOSIS — Z79899 Other long term (current) drug therapy: Secondary | ICD-10-CM | POA: Insufficient documentation

## 2015-10-10 NOTE — ED Provider Notes (Signed)
CSN: [id number]     Arrival date & time 10/10/15  2318 History  By signing my name below, I, Marisue Humble, attest that this documentation has been prepared under the direction and in the presence of non-physician practitioner, Antony Madura, PA-C. Electronically Signed: Marisue Humble, Scribe. 10/11/2015. 12:04 AM.   Chief Complaint  Patient presents with  . Skin Problem   The history is provided by the patient. No language interpreter was used.   HPI Comments:  Leah [name] is a 20 y.o. female with PMHx of seizure, gallstones, and GERD who presents to the Emergency Department complaining of intermittent, mild pain around belly button ring for the past month. She expresses concern for infection. Pt had her belly button pierced ~ 4 months ago. No alleviating factors noted or treatments attempted PTA. She reports pain is worse if she "hits something". No associated fevers, N/V.  Past Medical History  Diagnosis Date  . Anxiety 2011    no meds during pregnancy  . 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 Gall stones   . Anemia 2013  . GERD (gastroesophageal reflux disease)   . Chlamydia    Past Surgical History  Procedure Laterality Date  . Wisdom tooth extraction  2013  . Direct laryngoscopy N/A 04/24/2014    Procedure: DIRECT LARYNGOSCOPY;  Surgeon: [name], MD;  Location: [facility] OR;  Service: ENT;  Laterality: N/A;  . Foreign body removal esophageal N/A 04/24/2014    Procedure: REMOVAL FOREIGN BODY ESOPHAGEAL;  Surgeon: [name], MD;  Location: [facility] OR;  Service: ENT;  Laterality: N/A;  . Cholecystectomy N/A 09/28/2014    Procedure: LAPAROSCOPIC CHOLECYSTECTOMY;  Surgeon: Axel Filler, MD;  Location: [facility] OR;  Service: General;  Laterality: N/A;   Family History  Problem Relation Age of Onset  . Arthritis Mother   . Bronchitis Mother   . Asthma Mother   . Hearing loss Paternal Grandfather   . Cancer Neg Hx   .  Diabetes Neg Hx    Social History  Substance Use Topics  . Smoking status: Former Games developer  . Smokeless tobacco: Never Used  . Alcohol Use: No   OB History    Gravida Para Term Preterm AB TAB SAB Ectopic Multiple Living   0 0 0 0 0 0 1      Review of Systems  Constitutional: Negative for fever.  Skin: Positive for wound.  All other systems reviewed and are negative.   Allergies  Review of patient's allergies indicates no known allergies.  Home Medications   Prior to Admission medications   Medication Sig Start Date End Date Taking? Authorizing Provider  azithromycin (ZITHROMAX) 500 MG tablet Take 2 tablets (1,000 mg total) by mouth daily. Patient not taking: Reported on 05/16/2015 04/20/15   Santiago Glad, PA-C  benzonatate (TESSALON) 100 MG capsule Take 1 capsule (100 mg total) by mouth every 8 (eight) hours. Patient not taking: Reported on 07/03/2015 05/10/15   Renne Crigler, PA-C  doxycycline (VIBRAMYCIN) 100 MG capsule Take 1 capsule (100 mg total) by mouth 2 (two) times daily. Patient not taking: Reported on 07/03/2015 05/16/15   Antony Madura, PA-C  ibuprofen (ADVIL,MOTRIN) 800 MG tablet Take 1 tablet (800 mg total) by mouth 3 (three) times daily. 07/04/15   Shawn C Joy, PA-C  metroNIDAZOLE (FLAGYL) 500 MG tablet Take 1 tablet (500 mg total) by mouth  2 (two) times daily. 07/04/15   Shawn C Joy, PA-C  naproxen (NAPROSYN) 500 MG tablet Take 1 tablet (500 mg total) by mouth 2 (two) times daily. 05/10/15   Renne CriglerJoshua Geiple, PA-C  oxyCODONE-acetaminophen (PERCOCET/ROXICET) 5-325 MG tablet Take 2 tablets by mouth every 4 (four) hours as needed for severe pain. 07/04/15   Shawn C Joy, PA-C  phenazopyridine (PYRIDIUM) 200 MG tablet Take 1 tablet (200 mg total) by mouth 3 (three) times daily. 07/04/15   Shawn C Joy, PA-C  sulfamethoxazole-trimethoprim (BACTRIM DS,SEPTRA DS) 800-160 MG tablet Take 1 tablet by mouth 2 (two) times daily. 10/11/15 10/18/15  Antony MaduraKelly Azari Janssens, PA-C  traMADol  (ULTRAM) 50 MG tablet Take 1 tablet (50 mg total) by mouth every 6 (six) hours as needed for severe pain. Patient not taking: Reported on 07/03/2015 05/16/15   Antony MaduraKelly Saydie Gerdts, PA-C   BP 103/69 mmHg  Pulse 76  Temp(Src) 98.4 F (36.9 C) (Oral)  Resp 18  SpO2 100%   Physical Exam  Constitutional: She is oriented to person, place, and time. She appears well-developed and well-nourished. No distress.  HENT:  Head: Normocephalic and atraumatic.  Eyes: Conjunctivae and EOM are normal. No scleral icterus.  Neck: Normal range of motion.  Cardiovascular: Normal rate, regular rhythm and intact distal pulses.   Pulmonary/Chest: Effort normal. No respiratory distress.  Abdominal: Soft. She exhibits no distension. There is no rebound and no guarding.  Soft, nondistended abdomen. No rigidity.  Musculoskeletal: Normal range of motion.  Neurological: She is alert and oriented to person, place, and time. She exhibits normal muscle tone. Coordination normal.  Skin: Skin is warm and dry. No rash noted. She is not diaphoretic. There is erythema. No pallor.  Piercing to the superior aspect of the umbilicus with mild TTP. Scant purulence noted at wound site. No fluctuance or red linear streaking. Induration noted of 0.5cm diameter.  Psychiatric: She has a normal mood and affect. Her behavior is normal.  Nursing note and vitals reviewed.   ED Course  Procedures  DIAGNOSTIC STUDIES:  Oxygen Saturation is 100% on RA, normal by my interpretation.    COORDINATION OF CARE:  12:02 AM Recommend washing with warm soap and water. Will discharge with antibiotics. Discussed treatment plan with pt at bedside and pt agreed to plan.  Labs Review Labs Reviewed - No data to display  Imaging Review No results found. I have personally reviewed and evaluated these images and lab results as part of my medical decision-making.   EKG Interpretation None      MDM   Final diagnoses:  Infected piercing of trunk,  initial encounter    20 year old female presents to the emergency department for evaluation of one month of drainage and mild redness to her umbilicus piercing. Concern for mild infection. No evidence of abscess. No concern for peritonitis. Patient placed on Bactrim. Have advised warm compresses and wound recheck with PCP in 3 days. Return precautions discussed and provided. Patient discharged in good condition with no unaddressed concerns.  I personally performed the services described in this documentation, which was scribed in my presence. The recorded information has been reviewed and is accurate.    Filed Vitals:   10/10/15 2323  BP: 103/69  Pulse: 76  Temp: 98.4 F (36.9 C)  TempSrc: Oral  Resp: 18  SpO2: 100%     Antony MaduraKelly Dinorah Masullo, PA-C 10/11/15 0033  Derwood KaplanAnkit Nanavati, MD 10/11/15 [phone]

## 2015-10-10 NOTE — ED Notes (Signed)
Pt stated "it's been like this since I got it done 4 months ago but it didn't look this bad."

## 2015-10-10 NOTE — ED Notes (Signed)
Pt states that her belly button ring has been infected x 1 month. Alert and oriented.

## 2015-10-11 MED ORDER — SULFAMETHOXAZOLE-TRIMETHOPRIM 800-160 MG PO TABS: 1.0000 | ORAL_TABLET | Freq: Two times a day (BID) | ORAL | Status: AC

## 2015-10-11 NOTE — Discharge Instructions (Signed)
Take Bactrim as prescribed. Do not stop antibiotics early. Apply warm compresses 2-3 times per day. Have your wound rechecked by your primary doctor.  Wound Infection A wound infection happens when a type of germ (bacteria) starts growing in the wound. In some cases, this can cause the wound to break open. If cared for properly, the infected wound will heal from the inside to the outside. Wound infections need treatment. CAUSES An infection is caused by bacteria growing in the wound.  SYMPTOMS   Increase in redness, swelling, or pain at the wound site.  Increase in drainage at the wound site.  Wound or bandage (dressing) starts to smell bad.  Fever.  Feeling tired or fatigued.  Pus draining from the wound. TREATMENT  Your health care provider will prescribe antibiotic medicine. The wound infection should improve within 24 to 48 hours. Any redness around the wound should stop spreading and the wound should be less painful.  HOME CARE INSTRUCTIONS   Only take over-the-counter or prescription medicines for pain, discomfort, or fever as directed by your health care provider.  Take your antibiotics as directed. Finish them even if you start to feel better.  Gently wash the area with mild soap and water 2 times a day, or as directed. Rinse off the soap. Pat the area dry with a clean towel. Do not rub the wound. This may cause bleeding.  Follow your health care provider's instructions for how often you need to change the dressing.  Apply ointment and a dressing to the wound as directed.  If the dressing sticks, moisten it with soapy water and gently remove it.  Change the bandage right away if it becomes wet, dirty, or develops a bad smell.  Take showers. Do not take tub baths, swim, or do anything that may soak the wound until it is healed.  Avoid exercises that make you sweat heavily.  Use anti-itch medicine as directed by your health care provider. The wound may itch when it is  healing. Do not pick or scratch at the wound.  Follow up with your health care provider to get your wound rechecked as directed. SEEK MEDICAL CARE IF:  You have an increase in swelling, pain, or redness around the wound.  You have an increase in the amount of pus coming from the wound.  There is a bad smell coming from the wound.  More of the wound breaks open.  You have a fever. MAKE SURE YOU:   Understand these instructions.  Will watch your condition.  Will get help right away if you are not doing well or get worse.   This information is not intended to replace advice given to you by your health care provider. Make sure you discuss any questions you have with your health care provider.   Document Released: 04/06/2003 Document Revised: 07/13/2013 Document Reviewed: 12/26/2014 Elsevier Interactive Patient Education [employer]2016 Elsevier Inc.

## 2015-10-29 ENCOUNTER — Emergency Department (HOSPITAL_COMMUNITY)
Admission: EM | Admit: 2015-10-29 | Discharge: 2015-10-29 | Disposition: A | Discharge status: Home / Self Care | Payer: Medicaid Other | Attending: Emergency Medicine | Admitting: Emergency Medicine

## 2015-10-29 ENCOUNTER — Encounter (HOSPITAL_COMMUNITY): Payer: Self-pay | Admitting: *Deleted

## 2015-10-29 DIAGNOSIS — Z8619 Personal history of other infectious and parasitic diseases: Secondary | ICD-10-CM | POA: Diagnosis not present

## 2015-10-29 DIAGNOSIS — Z862 Personal history of diseases of the blood and blood-forming organs and certain disorders involving the immune mechanism: Secondary | ICD-10-CM | POA: Insufficient documentation

## 2015-10-29 DIAGNOSIS — Z8659 Personal history of other mental and behavioral disorders: Secondary | ICD-10-CM | POA: Insufficient documentation

## 2015-10-29 DIAGNOSIS — J029 Acute pharyngitis, unspecified: Secondary | ICD-10-CM

## 2015-10-29 DIAGNOSIS — Z8719 Personal history of other diseases of the digestive system: Secondary | ICD-10-CM | POA: Diagnosis not present

## 2015-10-29 DIAGNOSIS — R220 Localized swelling, mass and lump, head: Secondary | ICD-10-CM | POA: Insufficient documentation

## 2015-10-29 DIAGNOSIS — Z791 Long term (current) use of non-steroidal anti-inflammatories (NSAID): Secondary | ICD-10-CM | POA: Diagnosis not present

## 2015-10-29 DIAGNOSIS — Z792 Long term (current) use of antibiotics: Secondary | ICD-10-CM | POA: Diagnosis not present

## 2015-10-29 DIAGNOSIS — Z79899 Other long term (current) drug therapy: Secondary | ICD-10-CM | POA: Insufficient documentation

## 2015-10-29 DIAGNOSIS — R070 Pain in throat: Secondary | ICD-10-CM | POA: Diagnosis present

## 2015-10-29 LAB — RAPID STREP SCREEN (MED CTR MEBANE ONLY): Streptococcus, Group A Screen (Direct): NEGATIVE

## 2015-10-29 MED ORDER — DEXAMETHASONE SODIUM PHOSPHATE 10 MG/ML IJ SOLN
10.0000 mg | Freq: Once | INTRAMUSCULAR | Status: AC
  Administered 2015-10-29: 10 mg via INTRAMUSCULAR
  Filled 2015-10-29: qty 1

## 2015-10-29 MED ORDER — DIPHENHYDRAMINE HCL 25 MG PO CAPS
25.0000 mg | ORAL_CAPSULE | Freq: Once | ORAL | Status: AC
  Administered 2015-10-29: 25 mg via ORAL
  Filled 2015-10-29: qty 1

## 2015-10-29 MED ORDER — LORATADINE 10 MG PO TABS: 10.0000 mg | ORAL_TABLET | Freq: Every day | ORAL | Status: DC

## 2015-10-29 MED ORDER — DIPHENHYDRAMINE HCL 25 MG PO TABS: 25.0000 mg | ORAL_TABLET | Freq: Four times a day (QID) | ORAL | Status: DC

## 2015-10-29 NOTE — ED Notes (Signed)
Per EMS report: EMS found pt lying on bed and when asked what was going on, pt states "I have no allergies but I'm having an allergic reaction to pollen."  Pt reports that she feels like her throat is closing up.  EMS reports that the pt talks in full sentences with ease and has no rash or hives on her body.  On arrival to ED, pt is texting on her cell phone with no acute distress or signs of facial edema noted.  Pt is a/o x 4 and ambulatory.

## 2015-10-29 NOTE — ED Provider Notes (Signed)
CSN: [id number]     Arrival date & time 10/29/15  1121 History   First MD Initiated Contact with Patient 10/29/15 1123     Chief Complaint  Patient presents with  . Allergic Reaction   HPI  Leah [name] is a 20 year old female with a past medical history of anxiety, pseudoseizures and reflux presenting with throat pain and facial swelling. Patient states that she was talking on her cell phone last evening when she felt like her cheeks were swelling. She sent a picture to her friend who confirms that her cheeks looked swollen. She is also complaining of a sore throat. She states that it hurts to swallow. She denies difficulty handling secretions, breathing or swallowing. She states that since her cheeks were swelling she thought she was having an allergic reaction. She took a video of her throat and said that it looked like it was pulsating and she became concerned that her throat was closing up. She denies difficulty breathing at any time last evening or today. She endorses postnasal drip but no nasal congestion or rhinorrhea. She states that her mother told her she was allergic to pollen. She denies other known allergies. She denies new food, personal hygiene products or other recent exposures. She has not tried any medications at home for this problem. She denies fevers, chills, dizziness, syncope, ear pain, ear fullness, eye discharge, eye itching, eyelid swelling, lip swelling, tongue swelling, chest pain, wheezing, shortness of breath, abdominal pain, nausea or vomiting, diarrhea or rashes.  Past Medical History  Diagnosis Date  . Anxiety 2011    no meds during pregnancy  . 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 Gall stones   . Anemia 2013  . GERD (gastroesophageal reflux disease)   . Chlamydia    Past Surgical History  Procedure Laterality Date  . Wisdom tooth extraction  2013  . Direct laryngoscopy N/A 04/24/2014   Procedure: DIRECT LARYNGOSCOPY;  Surgeon: [name], MD;  Location: [facility] OR;  Service: ENT;  Laterality: N/A;  . Foreign body removal esophageal N/A 04/24/2014    Procedure: REMOVAL FOREIGN BODY ESOPHAGEAL;  Surgeon: [name], MD;  Location: [facility] OR;  Service: ENT;  Laterality: N/A;  . Cholecystectomy N/A 09/28/2014    Procedure: LAPAROSCOPIC CHOLECYSTECTOMY;  Surgeon: Axel Filler, MD;  Location: [facility] OR;  Service: General;  Laterality: N/A;   Family History  Problem Relation Age of Onset  . Arthritis Mother   . Bronchitis Mother   . Asthma Mother   . Hearing loss Paternal Grandfather   . Cancer Neg Hx   . Diabetes Neg Hx    Social History  Substance Use Topics  . Smoking status: Never Smoker   . Smokeless tobacco: Never Used  . Alcohol Use: No   OB History    Gravida Para Term Preterm AB TAB SAB Ectopic Multiple Living   0 0 0 0 0 0 1     Review of Systems  Constitutional: Negative for fever and chills.  HENT: Positive for facial swelling (cheeks), postnasal drip and sore throat. Negative for congestion, drooling, ear pain, rhinorrhea, sneezing and trouble swallowing.   Eyes: Negative for discharge, redness and itching.  Respiratory: Negative for cough, chest tightness, shortness of breath and wheezing.   Cardiovascular: Negative for chest pain.  Gastrointestinal: Negative for nausea, vomiting, abdominal pain and diarrhea.  Skin: Negative for  rash.  Allergic/Immunologic: Negative for environmental allergies and food allergies.  Neurological: Negative for dizziness and syncope.  All other systems reviewed and are negative.     Allergies  Review of patient's allergies indicates no known allergies.  Home Medications   Prior to Admission medications   Medication Sig Start Date End Date Taking? Authorizing Provider  azithromycin (ZITHROMAX) 500 MG tablet Take 2 tablets (1,000 mg total) by mouth daily. Patient not taking: Reported on 05/16/2015 04/20/15   Santiago GladHeather  Laisure, PA-C  benzonatate (TESSALON) 100 MG capsule Take 1 capsule (100 mg total) by mouth every 8 (eight) hours. Patient not taking: Reported on 07/03/2015 05/10/15   Renne CriglerJoshua Geiple, PA-C  diphenhydrAMINE (BENADRYL) 25 MG tablet Take 1 tablet (25 mg total) by mouth every 6 (six) hours. 10/29/15   Ladeja Pelham, PA-C  doxycycline (VIBRAMYCIN) 100 MG capsule Take 1 capsule (100 mg total) by mouth 2 (two) times daily. Patient not taking: Reported on 07/03/2015 05/16/15   Antony MaduraKelly Humes, PA-C  ibuprofen (ADVIL,MOTRIN) 800 MG tablet Take 1 tablet (800 mg total) by mouth 3 (three) times daily. 07/04/15   Shawn C Joy, PA-C  loratadine (CLARITIN) 10 MG tablet Take 1 tablet (10 mg total) by mouth daily. 10/29/15   Camillia Marcy, PA-C  metroNIDAZOLE (FLAGYL) 500 MG tablet Take 1 tablet (500 mg total) by mouth 2 (two) times daily. 07/04/15   Shawn C Joy, PA-C  naproxen (NAPROSYN) 500 MG tablet Take 1 tablet (500 mg total) by mouth 2 (two) times daily. 05/10/15   Renne CriglerJoshua Geiple, PA-C  oxyCODONE-acetaminophen (PERCOCET/ROXICET) 5-325 MG tablet Take 2 tablets by mouth every 4 (four) hours as needed for severe pain. 07/04/15   Shawn C Joy, PA-C  phenazopyridine (PYRIDIUM) 200 MG tablet Take 1 tablet (200 mg total) by mouth 3 (three) times daily. 07/04/15   Shawn C Joy, PA-C  traMADol (ULTRAM) 50 MG tablet Take 1 tablet (50 mg total) by mouth every 6 (six) hours as needed for severe pain. Patient not taking: Reported on 07/03/2015 05/16/15   Antony MaduraKelly Humes, PA-C   BP 115/73 mmHg  Pulse 82  Temp(Src) 99.1 F (37.3 C) (Oral)  Resp 16  SpO2 100%  LMP 10/04/2015 Physical Exam  Constitutional: She appears well-developed and well-nourished. No distress.  Patient is in no acute distress. Talking in full sentences and texting on her cell phone  HENT:  Head: Normocephalic and atraumatic.  Mouth/Throat: Oropharynx is clear and moist.  No eyelid, lip or tongue edema. Oropharynx is clear without edema or erythema. Uvula is  midline without edema. Pt handling secretions well.   Eyes: Conjunctivae are normal. Right eye exhibits no discharge. Left eye exhibits no discharge.  Neck: Normal range of motion. Neck supple.  No stridor. FROM of neck intact.   Cardiovascular: Normal rate, regular rhythm and normal heart sounds.   Pulmonary/Chest: Effort normal and breath sounds normal. No respiratory distress. She has no wheezes. She has no rales.  Breathing unlabored. Lungs CTAB  Abdominal: Soft. She exhibits no distension. There is no tenderness.  Musculoskeletal: Normal range of motion.  Moves all extremities spontaneously  Neurological: She is alert. Coordination normal.  Skin: Skin is warm and dry. No rash noted.  No urticaria   Psychiatric: She has a normal mood and affect. Her behavior is normal.  Nursing note and vitals reviewed.   ED Course  Procedures (including critical care time) Labs Review Labs Reviewed  RAPID STRE[facility] SCREEN (NOT [facility])  CULTURE, GROUP A STREP [facility])  Imaging Review No results found. I have personally reviewed and evaluated these images and lab results as part of my medical decision-making.   EKG Interpretation None      MDM   Final diagnoses:  Sore throat   20 year old female presenting with sore throat, cheek swelling and postnasal drip 1 day. Afebrile, hemodynamically stable and nontoxic appearing. No edema of eyelids or lips, no oropharyngeal edema or erythema, no stridor, no wheezing and patient is in no respiratory distress. Denies difficulty breathing, swallowing or handling secretions. Treated with Decadron and benadryl in ED. Rapid strep negative. Patient is hemodynamically stable, in no respiratory distress, and denies the feeling of throat closing. Presentation consistent with seasonal allergies versus viral pharyngitis. Will discharge with  Benadryl and claritin. Instructed to return to the ED if they have recurrent reaction with symptoms including difficulty  breathing, difficulty swallowing, sensation of throat closing or swelling of the lips, face or tongue. Pt is to follow up with their PCP as needed. Pt is agreeable with plan & verbalizes understanding. Return precautions given in discharge paperwork and discussed with pt at bedside. Pt stable for discharge      Alveta Heimlich, PA-C 10/29/15 1224  Lyndal Pulley, MD 10/29/15 1758

## 2015-10-29 NOTE — ED Notes (Signed)
Bed: [id number] Expected date:  Expected time:  Means of arrival:  Comments: ems

## 2015-10-29 NOTE — Discharge Instructions (Signed)
Schedule a follow up appointment with your PCP. Take benadryl and claritin for seasonal allergies. You may buy chloraseptic spray at a pharmacy which will help with your sore throat. Return to ED with new, worsening or concerning symptoms.    Sore Throat A sore throat is pain, burning, irritation, or scratchiness of the throat. There is often pain or tenderness when swallowing or talking. A sore throat may be accompanied by other symptoms, such as coughing, sneezing, fever, and swollen neck glands. A sore throat is often the first sign of another sickness, such as a cold, flu, strep throat, or mononucleosis (commonly known as mono). Most sore throats go away without medical treatment. CAUSES  The most common causes of a sore throat include:  A viral infection, such as a cold, flu, or mono.  A bacterial infection, such as strep throat, tonsillitis, or whooping cough.  Seasonal allergies.  Dryness in the air.  Irritants, such as smoke or pollution.  Gastroesophageal reflux disease (GERD). HOME CARE INSTRUCTIONS   Only take over-the-counter medicines as directed by your caregiver.  Drink enough fluids to keep your urine clear or pale yellow.  Rest as needed.  Try using throat sprays, lozenges, or sucking on hard candy to ease any pain (if older than 4 years or as directed).  Sip warm liquids, such as broth, herbal tea, or warm water with honey to relieve pain temporarily. You may also eat or drink cold or frozen liquids such as frozen ice pops.  Gargle with salt water (mix 1 tsp salt with 8 oz of water).  Do not smoke and avoid secondhand smoke.  Put a cool-mist humidifier in your bedroom at night to moisten the air. You can also turn on a hot shower and sit in the bathroom with the door closed for 5-10 minutes. SEEK IMMEDIATE MEDICAL CARE IF:  You have difficulty breathing.  You are unable to swallow fluids, soft foods, or your saliva.  You have increased swelling in the  throat.  Your sore throat does not get better in 7 days.  You have nausea and vomiting.  You have a fever or persistent symptoms for more than 2-3 days.  You have a fever and your symptoms suddenly get worse. MAKE SURE YOU:   Understand these instructions.  Will watch your condition.  Will get help right away if you are not doing well or get worse.   This information is not intended to replace advice given to you by your health care provider. Make sure you discuss any questions you have with your health care provider.   Document Released: 08/15/2004 Document Revised: 07/29/2014 Document Reviewed: 03/15/2012 Elsevier Interactive Patient Education 2016 [employer]. Allergic Rhinitis Allergic rhinitis is when the mucous membranes in the nose respond to allergens. Allergens are particles in the air that cause your body to have an allergic reaction. This causes you to release allergic antibodies. Through a chain of events, these eventually cause you to release histamine into the blood stream. Although meant to protect the body, it is this release of histamine that causes your discomfort, such as frequent sneezing, congestion, and an itchy, runny nose.  CAUSES Seasonal allergic rhinitis (hay fever) is caused by pollen allergens that may come from grasses, trees, and weeds. Year-round allergic rhinitis (perennial allergic rhinitis) is caused by allergens such as house dust mites, pet dander, and mold spores. SYMPTOMS  Nasal stuffiness (congestion).  Itchy, runny nose with sneezing and tearing of the eyes. DIAGNOSIS Your health care  provider can help you determine the allergen or allergens that trigger your symptoms. If you and your health care provider are unable to determine the allergen, skin or blood testing may be used. Your health care provider will diagnose your condition after taking your health history and performing a physical exam. Your health care provider may assess you for  other related conditions, such as asthma, pink eye, or an ear infection. TREATMENT Allergic rhinitis does not have a cure, but it can be controlled by:  Medicines that block allergy symptoms. These may include allergy shots, nasal sprays, and oral antihistamines.  Avoiding the allergen. Hay fever may often be treated with antihistamines in pill or nasal spray forms. Antihistamines block the effects of histamine. There are over-the-counter medicines that may help with nasal congestion and swelling around the eyes. Check with your health care provider before taking or giving this medicine. If avoiding the allergen or the medicine prescribed do not work, there are many new medicines your health care provider can prescribe. Stronger medicine may be used if initial measures are ineffective. Desensitizing injections can be used if medicine and avoidance does not work. Desensitization is when a patient is given ongoing shots until the body becomes less sensitive to the allergen. Make sure you follow up with your health care provider if problems continue. HOME CARE INSTRUCTIONS It is not possible to completely avoid allergens, but you can reduce your symptoms by taking steps to limit your exposure to them. It helps to know exactly what you are allergic to so that you can avoid your specific triggers. SEEK MEDICAL CARE IF:  You have a fever.  You develop a cough that does not stop easily (persistent).  You have shortness of breath.  You start wheezing.  Symptoms interfere with normal daily activities.   This information is not intended to replace advice given to you by your health care provider. Make sure you discuss any questions you have with your health care provider.   Document Released: 04/02/2001 Document Revised: 07/29/2014 Document Reviewed: 03/15/2013 Elsevier Interactive Patient Education [employer] Elsevier Inc.

## 2015-10-31 LAB — CULTURE, GROUP A STREP (THRC)

## 2015-12-06 ENCOUNTER — Emergency Department (HOSPITAL_COMMUNITY)
Admission: EM | Admit: 2015-12-06 | Discharge: 2015-12-07 | Disposition: A | Discharge status: Other Acute Inpatient Hospital | Payer: Medicaid Other | Attending: Emergency Medicine | Admitting: Emergency Medicine

## 2015-12-06 DIAGNOSIS — Y9289 Other specified places as the place of occurrence of the external cause: Secondary | ICD-10-CM | POA: Insufficient documentation

## 2015-12-06 DIAGNOSIS — Y998 Other external cause status: Secondary | ICD-10-CM | POA: Diagnosis not present

## 2015-12-06 DIAGNOSIS — F1410 Cocaine abuse, uncomplicated: Secondary | ICD-10-CM | POA: Diagnosis not present

## 2015-12-06 DIAGNOSIS — Z8659 Personal history of other mental and behavioral disorders: Secondary | ICD-10-CM | POA: Insufficient documentation

## 2015-12-06 DIAGNOSIS — Y9389 Activity, other specified: Secondary | ICD-10-CM | POA: Insufficient documentation

## 2015-12-06 DIAGNOSIS — T1491 Suicide attempt: Secondary | ICD-10-CM | POA: Diagnosis present

## 2015-12-06 DIAGNOSIS — T39312A Poisoning by propionic acid derivatives, intentional self-harm, initial encounter: Secondary | ICD-10-CM | POA: Diagnosis not present

## 2015-12-06 DIAGNOSIS — Z3202 Encounter for pregnancy test, result negative: Secondary | ICD-10-CM | POA: Diagnosis not present

## 2015-12-06 DIAGNOSIS — T50912A Poisoning by multiple unspecified drugs, medicaments and biological substances, intentional self-harm, initial encounter: Secondary | ICD-10-CM

## 2015-12-06 LAB — I-STAT BETA HCG BLOOD, ED (MC, WL, AP ONLY): I-stat hCG, quantitative: <5 m[IU]/mL (ref <5)

## 2015-12-06 LAB — CBG MONITORING, ED: Glucose-Capillary: 86 mg/dL (ref 65–99)

## 2015-12-06 MED ORDER — SODIUM CHLORIDE 0.9 % IV BOLUS (SEPSIS)
1000.0000 mL | Freq: Once | INTRAVENOUS | Status: AC
  Administered 2015-12-06: 1000 mL via INTRAVENOUS

## 2015-12-06 NOTE — ED Provider Notes (Addendum)
By signing my name below, I, Marisue Humble, attest that this documentation has been prepared under the direction and in the presence of Yanis Juma N Melton Walls, DO . Electronically Signed: Marisue Humble, Scribe. 12/06/2015. 11:33 PM.  TIME SEEN: 11:19 PM  CHIEF COMPLAINT: Drug Overdose  Level 5 Caveat due to pt condition  HPI: HPI Comments:  Leah [name] is a 20 y.o. female with PMHx pf depression who presents to the Emergency Department s/p suspected overdose. The pt's friend told nursing staff that pt overdosed on ibuprofen. Unsure the amount or what time. Friend denied any illegal drug use. Pt sent mother a text ~1 hour ago stating that she wanted to die. Per pt's mother, pt has previously attempted suicide. Pt does not currently see a therapist or PCP. Was previously on Lexapro but is no longer taking this medication per mother. Mother denies any other medical history or medication use.   ROS: Level 5 Caveat due to pt condition  PAST MEDICAL HISTORY/PAST SURGICAL HISTORY:  No past medical history on file.  MEDICATIONS:  Prior to Admission medications   Not on File    ALLERGIES:  Allergies not on file  SOCIAL HISTORY:  Social History  Substance Use Topics  . Smoking status: Not on file  . Smokeless tobacco: Not on file  . Alcohol Use: Not on file    FAMILY HISTORY: No family history on file.  EXAM: BP 134/92 mmHg  Pulse 115  Resp 17  SpO2 100% CONSTITUTIONAL: Alert; seems to be moving eyes voluntarily but will not Answer questions or follow commands; seems volitional; Well-appearing; well-nourished HEAD: Normocephalic; patient does have some right-sided periorbital ecchymosis EYES: Conjunctivae clear, PERRL, EOMI ENT: normal nose; no rhinorrhea; moist mucous membranes; pharynx without lesions noted; no dental injury; no septal hematoma NECK: Supple, no meningismus, no LAD; no midline spinal tenderness, step-off or deformity CARD: RRR; S1 and S2 appreciated; no murmurs,  no clicks, no rubs, no gallops RESP: Normal chest excursion without splinting or tachypnea; breath sounds clear and equal bilaterally; no wheezes, no rhonchi, no rales; no hypoxia or respiratory distress CHEST:  chest wall stable, no crepitus or ecchymosis or deformity, nontender to palpation ABD/GI: Normal bowel sounds; non-distended; soft, non-tender, no rebound, no guarding PELVIS:  stable, nontender to palpation BACK:  The back appears normal and is non-tender to palpation, there is no CVA tenderness; no midline spinal tenderness, step-off or deformity EXT: Normal ROM in all joints; non-tender to palpation; no edema; normal capillary refill; no cyanosis, no bony tenderness or bony deformity of patient's extremities, no joint effusion, no ecchymosis or lacerations    SKIN: Normal color for age and race; warm NEURO: Patient will not answer questions or follow commands, she does appear to be moving her eyes purposefully, no seizure-like activity   MEDICAL DECISION MAKING: Patient here with possible overdose. She is hemodynamically stable. Patient will not answer questions or follow commands but when we sit her upright in the bed she will hold her head up on her own. When I open her eyelids she tries to forcibly close them and looks down. I feel that her twitching eye movements are volitional. She is protecting her airway. CBG is 86. Will obtain labs, urine, CT of her head, neck and face given she does have some right-sided periorbital ecchymosis. No other sign of trauma on exam.  ED PROGRESS: 1:15 AM  Pt now slightly more cooperative. She will open her eyes to voice and is tearful. She does move her  extremities. She's been hemodynamically stable.  Labs have been unremarkable other than potassium of 2.8 with no EKG changes. Tylenol, salicylate levels pending.   2:20 AM  D/w Onalee Huaavid with poison control. He agrees with replacing patient's potassium. Recommends checking a Tylenol and salicylate level at 5  AM. Tylenol and salicylate were slightly elevated but not in toxic range.   4:15 AM  Pt now talking to nursing staff. Complaining of abdominal pain. Abdominal exam has been benign. Likely from ingestion. We'll give Protonix.    6:00 AM  Pt's second Tylenol level has gone down slightly. Her salicylate level has gone up some. Discussed with Onalee Huaavid with poison control who recommends checking another salicylate level and 2 hours after the second one to ensure that it is decreasing.   Patient's abdominal pain is gone after Protonix. She still has a very benign exam is asking for something to eat. She does now report that she took ibuprofen and Aleve and attempt to kill herself. Is unable to tell me when she took these tablets or how much she took. Denies any other ingestions.   8:00 AM  Pt's third salicylate level is going back down.  D/w Maryann with poison control.  Pt eating without difficulty.  No N/V.  Normal vitals.  Normal renal function and urine output.  Does not need another salicylate or tylenol level.  I feel patient is medically cleared and poison control agrees. We'll consult TTS. She is here voluntarily but if she decided to leave I feel she will need IVC paperwork. She doesn't report that this ingestion was an attempt to kill herself.   EKG Interpretation  Date/Time:  Wednesday Dec 06 2015 23:19:53 EDT Ventricular Rate:  93 PR Interval:  136 QRS Duration: 85 QT Interval:  345 QTC Calculation: 429 R Axis:   81 Text Interpretation:  Age not entered, assumed to be  20 years old for purpose of ECG interpretation Sinus rhythm Probable anteroseptal infarct, old Baseline wander in lead(s) II aVR No old tracing to compare Confirmed by Clova Morlock,  DO, Grover Robinson [phone]([id number] on 12/06/2015 11:38:24 PM         I personally performed the services described in this documentation, which was scribed in my presence. The recorded information has been reviewed and is accurate.    Layla MawKristen N Jamelle Goldston,  DO 12/07/15 [id number]  Layla MawKristen N Nahom Carfagno, DO 12/07/15 [id number]

## 2015-12-06 NOTE — ED Notes (Signed)
Family states patient sent out text messages stating that she planned on ending her life. Dr. Elesa MassedWard informed.

## 2015-12-06 NOTE — ED Notes (Signed)
Pt dropped off by friend for suspected overdose on ibuprofen. Not responding to pain but moving her eyes.

## 2015-12-07 ENCOUNTER — Emergency Department (HOSPITAL_COMMUNITY): Payer: Medicaid Other

## 2015-12-07 ENCOUNTER — Inpatient Hospital Stay (HOSPITAL_COMMUNITY)
Admission: AD | Admit: 2015-12-07 | Discharge: 2015-12-11 | DRG: 885 | Disposition: A | Discharge status: Home / Self Care | Payer: Medicaid Other | Source: Intra-hospital | Attending: Psychiatry | Admitting: Psychiatry

## 2015-12-07 ENCOUNTER — Encounter (HOSPITAL_COMMUNITY): Payer: Self-pay

## 2015-12-07 DIAGNOSIS — T1491 Suicide attempt: Secondary | ICD-10-CM | POA: Diagnosis not present

## 2015-12-07 DIAGNOSIS — F331 Major depressive disorder, recurrent, moderate: Principal | ICD-10-CM | POA: Diagnosis present

## 2015-12-07 DIAGNOSIS — R45851 Suicidal ideations: Secondary | ICD-10-CM | POA: Diagnosis present

## 2015-12-07 DIAGNOSIS — F329 Major depressive disorder, single episode, unspecified: Secondary | ICD-10-CM | POA: Diagnosis present

## 2015-12-07 DIAGNOSIS — T39312A Poisoning by propionic acid derivatives, intentional self-harm, initial encounter: Secondary | ICD-10-CM | POA: Diagnosis not present

## 2015-12-07 DIAGNOSIS — T391X2A Poisoning by 4-Aminophenol derivatives, intentional self-harm, initial encounter: Secondary | ICD-10-CM | POA: Diagnosis not present

## 2015-12-07 DIAGNOSIS — T50902A Poisoning by unspecified drugs, medicaments and biological substances, intentional self-harm, initial encounter: Secondary | ICD-10-CM | POA: Diagnosis present

## 2015-12-07 DIAGNOSIS — Z915 Personal history of self-harm: Secondary | ICD-10-CM | POA: Diagnosis not present

## 2015-12-07 LAB — URINALYSIS, ROUTINE W REFLEX MICROSCOPIC
Bilirubin Urine: NEGATIVE
GLUCOSE, UA: NEGATIVE mg/dL
Hgb urine dipstick: NEGATIVE
Ketones, ur: 15 mg/dL — AB
LEUKOCYTES UA: NEGATIVE
Nitrite: NEGATIVE
PH: 6.5 (ref 5.0–8.0)
Protein, ur: NEGATIVE mg/dL
SPECIFIC GRAVITY, URINE: 1.014 (ref 1.005–1.030)

## 2015-12-07 LAB — RAPID URINE DRUG SCREEN, HOSP PERFORMED
Amphetamines: NOT DETECTED
Barbiturates: NOT DETECTED
Benzodiazepines: NOT DETECTED
Cocaine: POSITIVE — AB
Opiates: NOT DETECTED
Tetrahydrocannabinol: NOT DETECTED

## 2015-12-07 LAB — COMPREHENSIVE METABOLIC PANEL
ALK PHOS: 62 U/L (ref 38–126)
ALT: 13 U/L — ABNORMAL LOW (ref 14–54)
ANION GAP: 13 (ref 5–15)
AST: 21 U/L (ref 15–41)
Albumin: 4.1 g/dL (ref 3.5–5.0)
BUN: 9 mg/dL (ref 6–20)
CALCIUM: 9 mg/dL (ref 8.9–10.3)
CO2: 21 mmol/L — AB (ref 22–32)
Chloride: 107 mmol/L (ref 101–111)
Creatinine, Ser: 0.71 mg/dL (ref 0.44–1.00)
GFR calc non Af Amer: >60 mL/min (ref >60)
Glucose, Bld: 91 mg/dL (ref 65–99)
Potassium: 2.8 mmol/L — ABNORMAL LOW (ref 3.5–5.1)
SODIUM: 141 mmol/L (ref 135–145)
Total Bilirubin: 1.1 mg/dL (ref 0.3–1.2)
Total Protein: 7.8 g/dL (ref 6.5–8.1)

## 2015-12-07 LAB — SALICYLATE LEVEL
SALICYLATE LVL: 5.6 mg/dL (ref 2.8–30.0)
Salicylate Lvl: 7.7 mg/dL (ref 2.8–30.0)
Salicylate Lvl: 6.9 mg/dL (ref 2.8–30.0)

## 2015-12-07 LAB — CBC
HCT: 37.4 % (ref 36.0–46.0)
HEMOGLOBIN: 12.6 g/dL (ref 12.0–15.0)
MCH: 30.2 pg (ref 26.0–34.0)
MCHC: 33.7 g/dL (ref 30.0–36.0)
MCV: 89.7 fL (ref 78.0–100.0)
PLATELETS: 246 10*3/uL (ref 150–400)
RBC: 4.17 MIL/uL (ref 3.87–5.11)
RDW: 12.9 % (ref 11.5–15.5)
WBC: 8 10*3/uL (ref 4.0–10.5)

## 2015-12-07 LAB — ETHANOL

## 2015-12-07 LAB — ACETAMINOPHEN LEVEL
ACETAMINOPHEN (TYLENOL), SERUM: 18 ug/mL (ref 10–30)
Acetaminophen (Tylenol), Serum: 19 ug/mL (ref 10–30)

## 2015-12-07 MED ORDER — POTASSIUM CHLORIDE 10 MEQ/100ML IV SOLN
10.0000 meq | INTRAVENOUS | Status: AC
  Administered 2015-12-07 (×4): 10 meq via INTRAVENOUS
  Filled 2015-12-07 (×4): qty 100

## 2015-12-07 MED ORDER — ALUM & MAG HYDROXIDE-SIMETH 200-200-20 MG/5ML PO SUSP: 30.0000 mL | ORAL | Status: DC | PRN

## 2015-12-07 MED ORDER — TRAZODONE HCL 50 MG PO TABS
25.0000 mg | ORAL_TABLET | Freq: Every evening | ORAL | Status: DC | PRN
  Administered 2015-12-07: 25 mg via ORAL
  Filled 2015-12-07: qty 1

## 2015-12-07 MED ORDER — ACETAMINOPHEN 325 MG PO TABS
650.0000 mg | ORAL_TABLET | Freq: Four times a day (QID) | ORAL | Status: DC | PRN
  Administered 2015-12-07: 650 mg via ORAL
  Filled 2015-12-07: qty 2

## 2015-12-07 MED ORDER — BENZOCAINE 10 % MT GEL
Freq: Three times a day (TID) | OROMUCOSAL | Status: DC | PRN
  Administered 2015-12-07 – 2015-12-10 (×2): via OROMUCOSAL
  Filled 2015-12-07: qty 9.4

## 2015-12-07 MED ORDER — PANTOPRAZOLE SODIUM 40 MG IV SOLR
40.0000 mg | Freq: Once | INTRAVENOUS | Status: AC
  Administered 2015-12-07: 40 mg via INTRAVENOUS
  Filled 2015-12-07: qty 40

## 2015-12-07 MED ORDER — MAGNESIUM HYDROXIDE 400 MG/5ML PO SUSP: 30.0000 mL | Freq: Every day | ORAL | Status: DC | PRN

## 2015-12-07 MED ORDER — SODIUM CHLORIDE 0.9 % IV SOLN
INTRAVENOUS | Status: DC
  Administered 2015-12-07: 02:00:00 via INTRAVENOUS

## 2015-12-07 NOTE — ED Notes (Signed)
Called security to wand. Placed in scrubs.

## 2015-12-07 NOTE — ED Notes (Signed)
Pt ambulatory to bathroom

## 2015-12-07 NOTE — ED Notes (Signed)
Pt continuing to refuse to speak to RN

## 2015-12-07 NOTE — BH Assessment (Addendum)
Tele Assessment Note   Leah [name] is an 20 y.o. female. Pt reports SI. Pt overdosed on unknown amount of Ibuprofen. Pt denies HI. Pt denies AVH. Pt states she that has many issues in her life at this time that have contributed to her suicidal thoughts. Pt did not want to elaborate on those issues. Pt reports 1 previous inpatient [facility] when she was 20 yo [facility] for self-harming behaviors. Pt reports previous outpatient treatment but could not recall the when or where the treatment occurred. Pt reports depressive symptoms. Pt reports isolating, fatigue, depressed mood most of the day, and loss of interest in activities. Pt states she has been depressed since 20 yo. Pt reports occasional alcohol use. Pt denies previous abuse.   Writer consulted with Renata Capriceonrad, DNP. Per Renata Capriceonrad Pt meets inpatient criteria. TTS to seek placement  Diagnosis:  F33.2 MDD, recurrent, severe  Past Medical History: No past medical history on file.  No past surgical history on file.  Family History: No family history on file.  Social History:  has no tobacco, alcohol, and drug history on file.  Additional Social History:  Alcohol / Drug Use Pain Medications: Pt denies  Prescriptions: Pt denies  Over the Counter: Pt denies  History of alcohol / drug use?: Yes Longest period of sobriety (when/how long): unknown Substance #1 Name of Substance 1: alcohol 1 - Age of First Use: unknown 1 - Amount (size/oz): "full bottle" 1 - Frequency: unknown 1 - Duration: unknown 1 - Last Use / Amount: unknown  CIWA: CIWA-Ar BP: 120/73 mmHg Pulse Rate: 83 COWS:    PATIENT STRENGTHS: (choose at least two) Communication skills Supportive family/friends  Allergies: Allergies not on file  Home Medications:  (Not in a hospital admission)  OB/GYN Status:  No LMP recorded.  General Assessment Data Location of Assessment: [facility] ED TTS Assessment: In system Is this a Tele or Face-to-Face Assessment?: Tele Assessment Is this an  Initial Assessment or a Re-assessment for this encounter?: Initial Assessment Marital status: Single Maiden name: NA Is patient pregnant?: No Pregnancy Status: No Living Arrangements: Alone Can pt return to current living arrangement?: Yes Admission Status: Voluntary Is patient capable of signing voluntary admission?: Yes Referral Source: Self/Family/Friend Insurance type: SP     Crisis Care Plan Living Arrangements: Alone Legal Guardian: Other: (self) Name of Psychiatrist: NA Name of Therapist: NA  Education Status Is patient currently in school?: Yes Current Grade: NA Highest grade of school patient has completed: 12 Name of school: NA Contact person: NA  Risk to self with the past 6 months Suicidal Ideation: Yes-Currently Present Has patient been a risk to self within the past 6 months prior to admission? : No Suicidal Intent: Yes-Currently Present Has patient had any suicidal intent within the past 6 months prior to admission? : No Is patient at risk for suicide?: Yes Suicidal Plan?: Yes-Currently Present Has patient had any suicidal plan within the past 6 months prior to admission? : Yes Specify Current Suicidal Plan: Pt overdosed Access to Means: Yes Specify Access to Suicidal Means: access to pills What has been your use of drugs/alcohol within the last 12 months?: alcohol Previous Attempts/Gestures: No How many times?: 0 Other Self Harm Risks: NA Triggers for Past Attempts: None known Intentional Self Injurious Behavior: None Family Suicide History: No Recent stressful life event(s): Other (Comment) (Pt did not want to disclose) Persecutory voices/beliefs?: No Depression: Yes Depression Symptoms: Despondent, Insomnia, Tearfulness, Isolating, Fatigue, Guilt, Loss of interest in usual pleasures,  Feeling worthless/self pity, Feeling angry/irritable Substance abuse history and/or treatment for substance abuse?: No Suicide prevention information given to  non-admitted patients: Not applicable  Risk to Others within the past 6 months Homicidal Ideation: No Does patient have any lifetime risk of violence toward others beyond the six months prior to admission? : No Thoughts of Harm to Others: No Current Homicidal Intent: No Current Homicidal Plan: No Access to Homicidal Means: No Identified Victim: NA History of harm to others?: No Assessment of Violence: None Noted Violent Behavior Description: NA Does patient have access to weapons?: No Criminal Charges Pending?: No Does patient have a court date: No Is patient on probation?: No  Psychosis Hallucinations: None noted Delusions: None noted  Mental Status Report Appearance/Hygiene: Disheveled Eye Contact: Fair Motor Activity: Freedom of movement Speech: Logical/coherent Level of Consciousness: Alert Mood: Depressed, Sad Affect: Depressed, Sad Anxiety Level: None Thought Processes: Coherent, Relevant Judgement: Unimpaired Orientation: Person, Place, Time, Situation Obsessive Compulsive Thoughts/Behaviors: None  Cognitive Functioning Concentration: Normal Memory: Recent Intact, Remote Intact IQ: Average Insight: Fair Impulse Control: Poor Appetite: Good Weight Loss: 0 Weight Gain: 0 Sleep: No Change Total Hours of Sleep: 8 Vegetative Symptoms: None  ADLScreening [facility] Assessment Services) Patient's cognitive ability adequate to safely complete daily activities?: Yes Patient able to express need for assistance with ADLs?: Yes Independently performs ADLs?: Yes (appropriate for developmental age)  Prior Inpatient Therapy Prior Inpatient Therapy: Yes Prior Therapy Dates: unknown Prior Therapy Facilty/Provider(s): [facility] Reason for Treatment: depression, self-harm  Prior Outpatient Therapy Prior Outpatient Therapy: Yes Prior Therapy Dates: unknown Prior Therapy Facilty/Provider(s): unknown Reason for Treatment: depression Does patient have an ACCT team?: No Does  patient have Intensive In-House Services?  : No Does patient have [facility] services? : No Does patient have P4CC services?: No  ADL Screening (condition at time of admission) Patient's cognitive ability adequate to safely complete daily activities?: Yes Is the patient deaf or have difficulty hearing?: No Does the patient have difficulty seeing, even when wearing glasses/contacts?: No Does the patient have difficulty concentrating, remembering, or making decisions?: No Patient able to express need for assistance with ADLs?: Yes Does the patient have difficulty dressing or bathing?: No Independently performs ADLs?: Yes (appropriate for developmental age) Does the patient have difficulty walking or climbing stairs?: No Weakness of Legs: None Weakness of Arms/Hands: None       Abuse/Neglect Assessment (Assessment to be complete while patient is alone) Physical Abuse: Denies Verbal Abuse: Denies Sexual Abuse: Denies Exploitation of patient/patient's resources: Denies Self-Neglect: Denies Values / Beliefs Cultural Requests During Hospitalization: None Spiritual Requests During Hospitalization: None   Advance Directives (For Healthcare) Does patient have an advance directive?: No Would patient like information on creating an advanced directive?: No - patient declined information    Additional Information 1:1 In Past 12 Months?: No CIRT Risk: No Elopement Risk: No Does patient have medical clearance?: Yes     Disposition:  Disposition Initial Assessment Completed for this Encounter: Yes  Christofer Shen D 12/07/2015 8:55 AM

## 2015-12-07 NOTE — Tx Team (Addendum)
Initial Interdisciplinary Treatment Plan   PATIENT STRESSORS: Occupational concerns Substance abuse   PATIENT STRENGTHS: Physical Health Supportive family/friends   PROBLEM LIST: Problem List/Patient Goals Date to be addressed Date deferred Reason deferred Estimated date of resolution  Suicide attempt 12/07/15     Impulsivity 12/07/15     "Learn how not to take an overdose again" 12/07/15     "I don't know" 12/07/15     Substance abuse 12/07/15                              DISCHARGE CRITERIA:  Improved stabilization in mood, thinking, and/or behavior Verbal commitment to aftercare and medication compliance  PRELIMINARY DISCHARGE PLAN: Outpatient therapy Medication management  PATIENT/FAMIILY INVOLVEMENT: This treatment plan has been presented to and reviewed with the patient, Leah [name].  The patient and family have been given the opportunity to ask questions and make suggestions.  Norm ParcelHeather V Shykeria Sakamoto 12/07/2015, 3:01 PM

## 2015-12-07 NOTE — Progress Notes (Signed)
D: Client report  In bed reports toothache. Client did not say the reason for this admission, just smiles. A: Writer encouraged client to report any concerns. Obtained order for topical analgesic for tooth pain (see MAR). Staff will maintain safety with q8815min checks.R: client is safe on the unit, attended karaoke.

## 2015-12-07 NOTE — ED Notes (Addendum)
Pt placed in paper scrubs, belongings inventoried by Apache CorporationEmilie RN & signed for by pt, wanded by security, and placed in Room C23 with sitter at the bedside. Regular breakfast tray ordered by phone.

## 2015-12-07 NOTE — ED Notes (Signed)
Patient was given a snack and drink. A regular diet ordered for lunch. 

## 2015-12-07 NOTE — ED Notes (Signed)
Notified Dr. Elesa MassedWard of abdominal pain, see new orders.

## 2015-12-07 NOTE — ED Notes (Signed)
Patient speaking with TTS.  

## 2015-12-07 NOTE — ED Notes (Signed)
Pt alert and some answering questions, reporting abdominal pain. When asked how much ibuprofen she took, just nodding her head.

## 2015-12-07 NOTE — Progress Notes (Signed)
Leah [name] is a 20 year old female being admitted voluntarily to [facility] from [facility].  She came to the ED OD on unknown amount of ibuprofen and she was unresponsive upon admission.  She reports that she has many issues going on in her life but didn't want to elaborate.  She reported to ED having depressive symptoms of isolating, fatigue, depressed mood and anhedonia.  She denies SI/HI or A/V hallucinations.  She states that she has multiple stressors but denies any issues at this time.  She was very quiet during the assessment and would answer many questions with "I don't know."  She has a diagnosis of MDD, recurrent, severe.  Admission paperwork completed and signed.  Belongings searched and secured in locker # 4 see belongings assessment sheet in paper chart.  Skin assessment completed and noted multiple tattoos and old gallbladder scar on mid abdomen.  Q 15 minute checks initiated for safety.  We will monitor the progress towards her goals.

## 2015-12-07 NOTE — ED Provider Notes (Signed)
The patient is accepted to the behavioral health Hospital by Dr. Jama Flavorsobos. Patient currently well-appearing, with stable vital signs, and no complaints. Blood work reviewed and with hypokalemia. She had received potassium repletion prior to my evaluation. Is felt to be medically cleared and stable for transfer to behavioral health Hospital for ongoing psychiatric management.  Leah Guiseana Duo Claiborne Stroble, MD 12/07/15 [phone]

## 2015-12-08 ENCOUNTER — Encounter (HOSPITAL_COMMUNITY): Payer: Self-pay | Admitting: Psychiatry

## 2015-12-08 DIAGNOSIS — R45851 Suicidal ideations: Secondary | ICD-10-CM

## 2015-12-08 DIAGNOSIS — T50902A Poisoning by unspecified drugs, medicaments and biological substances, intentional self-harm, initial encounter: Secondary | ICD-10-CM | POA: Diagnosis present

## 2015-12-08 MED ORDER — LOPERAMIDE HCL 2 MG PO CAPS: 2.0000 mg | ORAL_CAPSULE | ORAL | Status: AC | PRN

## 2015-12-08 MED ORDER — LORAZEPAM 1 MG PO TABS
1.0000 mg | ORAL_TABLET | Freq: Four times a day (QID) | ORAL | Status: AC | PRN
  Filled 2015-12-08: qty 1

## 2015-12-08 MED ORDER — THIAMINE HCL 100 MG/ML IJ SOLN: 100.0000 mg | Freq: Once | INTRAMUSCULAR | Status: DC

## 2015-12-08 MED ORDER — ONDANSETRON 4 MG PO TBDP: 4.0000 mg | ORAL_TABLET | Freq: Four times a day (QID) | ORAL | Status: AC | PRN

## 2015-12-08 MED ORDER — ADULT MULTIVITAMIN W/MINERALS CH
1.0000 | ORAL_TABLET | Freq: Every day | ORAL | Status: DC
  Administered 2015-12-08 – 2015-12-11 (×4): 1 via ORAL
  Filled 2015-12-08 (×6): qty 1

## 2015-12-08 MED ORDER — HYDROXYZINE HCL 25 MG PO TABS
25.0000 mg | ORAL_TABLET | Freq: Four times a day (QID) | ORAL | Status: AC | PRN
Start: 2015-12-08 — End: 2015-12-11
  Filled 2015-12-08: qty 10

## 2015-12-08 MED ORDER — VITAMIN B-1 100 MG PO TABS
100.0000 mg | ORAL_TABLET | Freq: Every day | ORAL | Status: DC
  Administered 2015-12-09 – 2015-12-11 (×3): 100 mg via ORAL
  Filled 2015-12-08 (×5): qty 1

## 2015-12-08 MED ORDER — MIRTAZAPINE 15 MG PO TABS
15.0000 mg | ORAL_TABLET | Freq: Every day | ORAL | Status: DC
  Administered 2015-12-08 – 2015-12-10 (×3): 15 mg via ORAL
  Filled 2015-12-08 (×5): qty 1
  Filled 2015-12-08: qty 7

## 2015-12-08 MED ORDER — POTASSIUM CHLORIDE CRYS ER 20 MEQ PO TBCR
20.0000 meq | EXTENDED_RELEASE_TABLET | Freq: Two times a day (BID) | ORAL | Status: AC
Start: 2015-12-08 — End: 2015-12-10
  Administered 2015-12-08: 20 meq via ORAL
  Filled 2015-12-08 (×4): qty 1
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
## 2015-12-08 NOTE — [facility] Group Notes (Signed)
[facility] LCSW Aftercare Discharge Planning Group Note  12/08/2015 8:45 AM  Pt did not attend, declined invitation.   Chad CordialLauren Carter, LCSWA 12/08/2015 9:29 AM

## 2015-12-08 NOTE — Progress Notes (Signed)
NUTRITION ASSESSMENT  Pt identified as at risk on the Malnutrition Screen Tool  INTERVENTION: 1. Educated patient on the importance of nutrition and encouraged intake of food and beverages. 2. Discussed weight goals. 3. Supplements: None at this time  NUTRITION DIAGNOSIS: Unintentional weight loss related to sub-optimal intake as evidenced by pt report.   Goal: Pt to meet >/= 90% of their estimated nutrition needs.  Monitor:  PO intake  Assessment:  Leah [name] is a  20 y.o. female who presents with suicide attempt via drug OD. She has a hx of depression, states she has been depressed since she was 20 yo. [facility] This was a [facility] admission. Pts wt is ok. Claims 90# to be normal weight. Denies wt loss. Endorses ok appetite "when I get depressed I don't eat anything." Encouraged pt to eat as much as she could. Snack if need be when she has depression spells.  Labs and medications reviewed  Height: Ht Readings from Last 1 Encounters:  12/07/15 4\' 11"  (1.499 m)    Weight: Wt Readings from Last 1 Encounters:  12/07/15 90 lb (40.824 kg)    Weight Hx: Wt Readings from Last 10 Encounters:  12/07/15 90 lb (40.824 kg)  12/06/15 110 lb (49.896 kg)    BMI:  Body mass index is 18.17 kg/(m^2). Pt meets criteria for underweight based on current BMI.  Estimated Nutritional Needs: Kcal: 25-30 kcal/kg Protein: > 1 gram protein/kg Fluid: 1 ml/kcal  Diet Order: Diet regular Room service appropriate?: Yes; Fluid consistency:: Thin Pt is also offered choice of unit snacks mid-morning and mid-afternoon.  Pt is eating as desired.   Lab results and medications reviewed.   Dionne AnoWilliam M. Ahlani Wickes, MS, RD LDN Inpatient Clinical Dietitian Pager [phone]

## 2015-12-08 NOTE — Progress Notes (Signed)
Patient ID: Leah [name], female   DOB: [birth year], 20 y.o.   MRN: [id number] D: Client visible on unit today, has visitor "my friend" reports anxiety "7" of 10. Client reports admission has been "kinda" helpful, "got some sleep"  Client shared that the bruise under right eye was self inflicted "hitting myself on the door knob" "they say I must be bipolar" "I just wake up mad" "I use to have a therapist, when I was [age]" "I never took no medicine" A: Writer commended client on sharing encouraged her to continue to take a part in her treatment. Medication reviewed, administered as ordered. Staff will monitor q1715min for safety. R: Client is safe on the unit, attended group.

## 2015-12-08 NOTE — H&P (Signed)
Psychiatric Admission Assessment Adult  Patient Identification: Leah [name] MRN:  [id number] Date of Evaluation:  12/08/2015 Chief Complaint:  MDD Principal Diagnosis: Suicide attempt by drug ingestion [facility]) Diagnosis:   Patient Active Problem List   Diagnosis Date Noted  . MDD (major depressive disorder), recurrent episode, moderate ([place]) [F33.1] 12/07/2015   IO:EVOJ Mooers is a 20 year female who lives independently in an apartment. She works at a [employer]. She has a 46 year old son, who is currently with her sister at the moment. She does not attend any college at the moment.   Chief Compliant:"OD on Ibuprofen and Excedrin. I took 2 handfuls of Ibuprofen and 4 Excedrin. Somebody sent me something where someone was talking about me. I got upset and started crying I sent out goodbye text messages. One of the persons I sent the message too came to my house and took me to the hospital. Before I got to the hospital I had vomited up some."  The pt's friend told nursing staff that pt overdosed on ibuprofen. Unsure the amount or what time. Friend denied any illegal drug use. Pt sent mother a text ~1 hour ago stating that she wanted to die. Per pt's mother, pt has previously attempted suicide. Pt does not currently see a therapist or PCP. Was previously on Lexapro but is no longer taking this medication per mother. Mother denies any other medical history or medication use.  HPI:  Below information from behavioral health assessment has been reviewed by me and I agreed with the findings. Leah [name] is an 21 y.o. female. Pt reports SI. Pt overdosed on unknown amount of Ibuprofen. Pt denies HI. Pt denies AVH. Pt states she that has many issues in her life at this time that have contributed to her suicidal thoughts. Pt did not want to elaborate on those issues. Pt reports 1 previous inpatient stay when she was 20 yo at [facility] for self-harming behaviors. Pt reports previous outpatient treatment but  could not recall the when or where the treatment occurred. Pt reports depressive symptoms. Pt reports isolating, fatigue, depressed mood most of the day, and loss of interest in activities. Pt states she has been depressed since 20 yo. Pt reports occasional alcohol use. Pt denies previous abuse.   Upon admission to the unit: [name] is a 20 year old female being admitted voluntarily to [facility] from [facility]. She came to the ED OD on unknown amount of ibuprofen and she was unresponsive upon admission. She reports that she has many issues going on in her life but didn't want to elaborate. She reported to ED having depressive symptoms of isolating, fatigue, depressed mood and anhedonia. She denies SI/HI or A/V hallucinations. She states that she has multiple stressors but denies any issues at this time. She was very quiet during the assessment and would answer many questions with "I don't know." She has a diagnosis of MDD, recurrent, severe. Admission paperwork completed and signed. Belongings searched and secured in locker # 4 see belongings assessment sheet in paper chart. Skin assessment completed and noted multiple tattoos and old gallbladder scar on mid abdomen. Drug related disorders:Alcohol use  Legal History:None  Past Psychiatric History: MDD, Self harm injury   Outpatient: None   Inpatient:[facility] for self -harm and depression   Past medication trial:Lexapro   Past JK:KXFG   Psychological testing:None  Medical Problems:None  Allergies:NOne  Surgeries:None  Head trauma:None  HWE:XHBZ   Family Psychiatric history: Denies   Family Medical  History:Denies  Associated Signs/Symptoms: Depression Symptoms:  Despondent, Insomnia, Tearfulness, Isolating, Fatigue, Guilt, Loss of interest in usual pleasures, Feeling worthless/self pity, Feeling angry/irritable (Hypo) Manic Symptoms:  Impulsivity, Irritable Mood, Labiality of Mood, Anxiety Symptoms:  Excessive Worry, Panic  Symptoms, Psychotic Symptoms:   PTSD Symptoms: Negative Total Time spent with patient: 30 minutes  Is the patient at risk to self? Yes.    Has the patient been a risk to self in the past 6 months? No.  Has the patient been a risk to self within the distant past? No.  Is the patient a risk to others? No.  Has the patient been a risk to others in the past 6 months? No.  Has the patient been a risk to others within the distant past? No.    Alcohol Screening: 1. How often do you have a drink containing alcohol?: 2 to 3 times a week 2. How many drinks containing alcohol do you have on a typical day when you are drinking?: 3 or 4 3. How often do you have six or more drinks on one occasion?: Never Preliminary Score: 1 9. Have you or someone else been injured as a result of your drinking?: No 10. Has a relative or friend or a doctor or another health worker been concerned about your drinking or suggested you cut down?: Yes, during the last year Alcohol Use Disorder Identification Test Final Score (AUDIT): 8 Brief Intervention: Yes  Past Medical History: History reviewed. No pertinent past medical history.  Past Surgical History  Procedure Laterality Date  . Cholecystectomy     Family History: History reviewed. No pertinent family history.  Tobacco Screening: Negative Social History:  History  Alcohol Use  . 3.6 oz/week  . 6 Cans of beer per week     History  Drug Use Not on file    Additional Social History:   Allergies:  No Known Allergies Lab Results:  Results for orders placed or performed during the hospital encounter of 12/06/15 (from the past 48 hour(s))  CBG monitoring, ED     Status: None   Collection Time: 12/06/15 11:22 PM  Result Value Ref Range   Glucose-Capillary 86 65 - 99 mg/dL  I-Stat beta hCG blood, ED     Status: None   Collection Time: 12/06/15 11:43 PM  Result Value Ref Range   I-stat hCG, quantitative <5.0 <5 mIU/mL   Comment 3            Comment:    GEST. AGE      CONC.  (mIU/mL)   <=1 WEEK        5 - 50     2 WEEKS       50 - 500     3 WEEKS       100 - 10,000     4 WEEKS     1,000 - 30,000        FEMALE AND NON-PREGNANT FEMALE:     LESS THAN 5 mIU/mL   Comprehensive metabolic panel     Status: Abnormal   Collection Time: 12/07/15 12:13 AM  Result Value Ref Range   Sodium 141 135 - 145 mmol/L   Potassium 2.8 (L) 3.5 - 5.1 mmol/L   Chloride 107 101 - 111 mmol/L   CO2 21 (L) 22 - 32 mmol/L   Glucose, Bld 91 65 - 99 mg/dL   BUN 9 6 - 20 mg/dL   Creatinine, Ser 0.71 0.44 - 1.00 mg/dL  Calcium 9.0 8.9 - 10.3 mg/dL   Total Protein 7.8 6.5 - 8.1 g/dL   Albumin 4.1 3.5 - 5.0 g/dL   AST 21 15 - 41 U/L   ALT 13 (L) 14 - 54 U/L   Alkaline Phosphatase 62 38 - 126 U/L   Total Bilirubin 1.1 0.3 - 1.2 mg/dL   GFR calc non Af Amer >60 >60 mL/min   GFR calc Af Amer >60 >60 mL/min    Comment: (NOTE) The eGFR has been calculated using the CKD EPI equation. This calculation has not been validated in all clinical situations. eGFR's persistently <60 mL/min signify possible Chronic Kidney Disease.    Anion gap 13 5 - 15  cbc     Status: None   Collection Time: 12/07/15 12:13 AM  Result Value Ref Range   WBC 8.0 4.0 - 10.5 K/uL   RBC 4.17 3.87 - 5.11 MIL/uL   Hemoglobin 12.6 12.0 - 15.0 g/dL   HCT 37.4 36.0 - 46.0 %   MCV 89.7 78.0 - 100.0 fL   MCH 30.2 26.0 - 34.0 pg   MCHC 33.7 30.0 - 36.0 g/dL   RDW 12.9 11.5 - 15.5 %   Platelets 246 150 - 400 K/uL  Rapid urine drug screen (hospital performed)     Status: Abnormal   Collection Time: 12/07/15 12:45 AM  Result Value Ref Range   Opiates NONE DETECTED NONE DETECTED   Cocaine POSITIVE (A) NONE DETECTED   Benzodiazepines NONE DETECTED NONE DETECTED   Amphetamines NONE DETECTED NONE DETECTED   Tetrahydrocannabinol NONE DETECTED NONE DETECTED   Barbiturates NONE DETECTED NONE DETECTED    Comment:        DRUG SCREEN FOR MEDICAL PURPOSES ONLY.  IF CONFIRMATION IS NEEDED FOR ANY  PURPOSE, NOTIFY LAB WITHIN 5 DAYS.        LOWEST DETECTABLE LIMITS FOR URINE DRUG SCREEN Drug Class       Cutoff (ng/mL) Amphetamine      1000 Barbiturate      200 Benzodiazepine   229 Tricyclics       798 Opiates          300 Cocaine          300 THC              50   Urinalysis, Routine w reflex microscopic (not at [facility])     Status: Abnormal   Collection Time: 12/07/15 12:45 AM  Result Value Ref Range   Color, Urine YELLOW YELLOW   APPearance CLEAR CLEAR   Specific Gravity, Urine 1.014 1.005 - 1.030   pH 6.5 5.0 - 8.0   Glucose, UA NEGATIVE NEGATIVE mg/dL   Hgb urine dipstick NEGATIVE NEGATIVE   Bilirubin Urine NEGATIVE NEGATIVE   Ketones, ur 15 (A) NEGATIVE mg/dL   Protein, ur NEGATIVE NEGATIVE mg/dL   Nitrite NEGATIVE NEGATIVE   Leukocytes, UA NEGATIVE NEGATIVE    Comment: MICROSCOPIC NOT DONE ON URINES WITH NEGATIVE PROTEIN, BLOOD, LEUKOCYTES, NITRITE, OR GLUCOSE <1000 mg/dL.  Ethanol     Status: None   Collection Time: 12/07/15  1:24 AM  Result Value Ref Range   Alcohol, Ethyl (B) <5 <5 mg/dL    Comment:        LOWEST DETECTABLE LIMIT FOR SERUM ALCOHOL IS 5 mg/dL FOR MEDICAL PURPOSES ONLY   Salicylate level     Status: None   Collection Time: 12/07/15  1:24 AM  Result Value Ref Range   Salicylate Lvl 5.6 2.8 -  30.0 mg/dL  Acetaminophen level     Status: None   Collection Time: 12/07/15  1:24 AM  Result Value Ref Range   Acetaminophen (Tylenol), Serum 19 10 - 30 ug/mL    Comment:        THERAPEUTIC CONCENTRATIONS VARY SIGNIFICANTLY. A RANGE OF 10-30 ug/mL MAY BE AN EFFECTIVE CONCENTRATION FOR MANY PATIENTS. HOWEVER, SOME ARE BEST TREATED AT CONCENTRATIONS OUTSIDE THIS RANGE. ACETAMINOPHEN CONCENTRATIONS >150 ug/mL AT 4 HOURS AFTER INGESTION AND >50 ug/mL AT 12 HOURS AFTER INGESTION ARE OFTEN ASSOCIATED WITH TOXIC REACTIONS.   Acetaminophen level     Status: None   Collection Time: 12/07/15  5:04 AM  Result Value Ref Range   Acetaminophen (Tylenol),  Serum 18 10 - 30 ug/mL    Comment:        THERAPEUTIC CONCENTRATIONS VARY SIGNIFICANTLY. A RANGE OF 10-30 ug/mL MAY BE AN EFFECTIVE CONCENTRATION FOR MANY PATIENTS. HOWEVER, SOME ARE BEST TREATED AT CONCENTRATIONS OUTSIDE THIS RANGE. ACETAMINOPHEN CONCENTRATIONS >150 ug/mL AT 4 HOURS AFTER INGESTION AND >50 ug/mL AT 12 HOURS AFTER INGESTION ARE OFTEN ASSOCIATED WITH TOXIC REACTIONS.   Salicylate level     Status: None   Collection Time: 12/07/15  5:04 AM  Result Value Ref Range   Salicylate Lvl 7.7 2.8 - 19.5 mg/dL  Salicylate level     Status: None   Collection Time: 12/07/15  7:17 AM  Result Value Ref Range   Salicylate Lvl 6.9 2.8 - 30.0 mg/dL    Blood Alcohol level:  Lab Results  Component Value Date   ETH <5 09/32/6712    Metabolic Disorder Labs:  No results found for: HGBA1C, MPG No results found for: PROLACTIN No results found for: CHOL, TRIG, HDL, CHOLHDL, VLDL, LDLCALC  Current Medications: Current Facility-Administered Medications  Medication Dose Route Frequency Provider Last Rate Last Dose  . acetaminophen (TYLENOL) tablet 650 mg  650 mg Oral Q6H PRN Benjamine Mola, FNP   650 mg at 12/07/15 1840  . alum & mag hydroxide-simeth (MAALOX/MYLANTA) 200-200-20 MG/5ML suspension 30 mL  30 mL Oral Q4H PRN Benjamine Mola, FNP      . benzocaine (ORAJEL) 10 % mucosal gel   Mouth/Throat TID PRN Lurena Nida, NP      . magnesium hydroxide (MILK OF MAGNESIA) suspension 30 mL  30 mL Oral Daily PRN Benjamine Mola, FNP      . traZODone (DESYREL) tablet 25 mg  25 mg Oral QHS PRN Lurena Nida, NP   25 mg at 12/07/15 2139   PTA Medications: No prescriptions prior to admission    Musculoskeletal: Strength & Muscle Tone: within normal limits Gait & Station: normal Patient leans: N/A  Psychiatric Specialty Exam: Physical Exam  ROS  Blood pressure 107/77, pulse 64, temperature 98 F (36.7 C), temperature source Oral, resp. rate 16, height 4' 11"  (1.499 m), weight  40.824 kg (90 lb), SpO2 99 %.Body mass index is 18.17 kg/(m^2).  General Appearance: Disheveled in paper scrubs  Eye Contact::  Minimal  Speech:  Clear and Coherent and Normal Rate  Volume:  Decreased  Mood:  Depressed and Hopeless  Affect:  Depressed and Flat  Thought Process:  Goal Directed and Intact  Orientation:  Full (Time, Place, and Person)  Thought Content:  WDL  Suicidal Thoughts:  Yes.  with intent/plan  Homicidal Thoughts:  No  Memory:  Immediate;   Fair Recent;   Good  Judgement:  Impaired  Insight:  Lacking  Psychomotor Activity:  Normal  Concentration:  Fair  Recall:  [place]: Fair  Akathisia:  No  Handed:  Right  AIMS (if indicated):     Assets:  Communication Skills Desire for Improvement Financial Resources/Insurance Housing Leisure Time Physical Health Social Support Talents/Skills Transportation  ADL's:  Intact  Cognition: WNL  Sleep:  Number of Hours: 6.75     Treatment Plan Summary: Daily contact with patient to assess and evaluate symptoms and progress in treatment and Medication management Plan: Review of chart, vital signs, medications, and notes.  1-Admit for crisis management and stabilization. Estimated length of stay 5-7 days past her current stay of 1  2-Individual and group therapy encouraged  3-Medication management for depression and anxiety to reduce current symptoms to base line and improve the patient's overall level of functioning: Medications reviewed with the patient and Mirtazapine 55m po daily for depression. Will initiate CIWA protocol.  4-Coping skills for depression, substance abuse, anger issues, and anxiety developing--  5-Continue crisis stabilization and management  6-Address health issues--monitoring vital signs, stable  7-Treatment plan in progress to prevent relapse of depression, angry outbursts, and anxiety  8-Psychosocial education regarding relapse prevention and self-care   9-Health care follow up as needed for any health concerns  10-Call for consult with hospitalist for additional specialty patient services as needed. 11. Will obtain repeat CMP for hypokalemia, and TSH levels.  12. Toothache- Orajel ordered at this time.   Observation Level/Precautions:  15 minute checks  Laboratory:  Labs obtained in the ED have been reviewed.  UDS positive for Cocaine, Potassium 2.8 was replaced.   Psychotherapy:  Individual and group therapy  Medications:  See above  Consultations:  Per need  Discharge Concerns:  Safety  Estimated LOS: 5-7 days  Other:     I certify that inpatient services furnished can reasonably be expected to improve the patient's condition.    TNanci Pina FNP 5/19/201711:43 AM Patient case reviewed and patient seen by me Agree with NP note and assessment  20year old female, recently overdosed on OTC analgesic ( NSAID ) . States she took about " two handfuls of ibuprofen". States attempt was not planned out, was impulsive. States she was upset at the time because she was getting texts from an acquaintance essentially teasing her about a dental prosthesis she has and " calling me all sort of things ". States she sent messages to friends saying goodbye, and that a friend came to see her and got her to hospital. States that before this incident she was not particularly depressed, and was not suicidal . However, she does report symptoms of depression such as low energy, poor sleep, poor appetite and has lost 5 lbs over recent days. Patient reports one prior admission for depression as a teenager, and describes history of self cutting in the past.  Denies drug abuse, describes history of alcohol abuse, drinks up to bottle of liquor 2-3 times per week. States last drank 2-3 days ago- admission BAL negative. No current symptoms of WDL.  Dx- Suicide attempt by overdose, alcohol abuse  Plan- inpatient admission- we discussed medication options ,  agrees to REMERON trial, which may also help sleep and appetite . ATIVAN PRNs to minimize risk of possible alcohol WDL symptoms. Check TSH

## 2015-12-08 NOTE — Tx Team (Signed)
Interdisciplinary Treatment Plan Update (Adult) Date: 12/08/2015   Date: 12/08/2015 8:30 AM  Progress in Treatment:  Attending groups: Pt is new to milieu, continuing to assess  Participating in groups: Pt is new to milieu, continuing to assess  Taking medication as prescribed: Yes  Tolerating medication: Yes  Family/Significant othe contact made: No, CSW assessing for appropriate contact Patient understands diagnosis: Continuing to assess Discussing patient identified problems/goals with staff: Yes  Medical problems stabilized or resolved: Yes  Denies suicidal/homicidal ideation: No, recently admitted after an intentional overdose Patient has not harmed self or Others: Yes   New problem(s) identified: None identified at this time.   Discharge Plan or Barriers: CSW will assess for appropriate discharge plan and relevant barriers.   Additional comments:  Patient and CSW reviewed pt's identified goals and treatment plan. Patient verbalized understanding and agreed to treatment plan. CSW reviewed [facility] "Discharge Process and Patient Involvement" Form. Pt verbalized understanding of information provided and signed form.   Reason for Continuation of Hospitalization:  Anxiety Depression Medication stabilization Suicidal ideation  Estimated length of stay: 3-5 days  Review of initial/current patient goals per problem list:   1.  Goal(s): Patient will participate in aftercare plan  Met:  No  Target date: 3-5 days from date of admission   As evidenced by: Patient will participate within aftercare plan AEB aftercare provider and housing plan at discharge being identified.  12/08/15: CSW to work with Pt to assess for appropriate discharge plan and faciliate appointments and referrals as needed prior to d/c.  2.  Goal (s): Patient will exhibit decreased depressive symptoms and suicidal ideations.  Met:  No  Target date: 3-5 days from date of admission   As evidenced by: Patient will  utilize self rating of depression at 3 or below and demonstrate decreased signs of depression or be deemed stable for discharge by MD. 12/08/15: Pt was admitted with symptoms of depression, rating 10/10. Pt continues to present with flat affect and depressive symptoms.  Pt will demonstrate decreased symptoms of depression and rate depression at 3/10 or lower prior to discharge.  3.  Goal(s): Patient will demonstrate decreased signs and symptoms of anxiety.  Met:  No  Target date: 3-5 days from date of admission   As evidenced by: Patient will utilize self rating of anxiety at 3 or below and demonstrated decreased signs of anxiety, or be deemed stable for discharge by MD 12/08/15: Pt was admitted with increased levels of anxiety and is currently rating those symptoms highly. Pt will demonstrated decreased symptoms of anxiety and rate it at 3/10 prior to d/c.   Attendees:  Patient:    Family:    Physician: Dr. Parke Poisson, MD  12/08/2015 8:30 AM  Nursing: Leah Pinks, RN Case manager  12/08/2015 8:30 AM  Clinical Social Worker Leah [name], Leah [name] 12/08/2015 8:30 AM  Other:  12/08/2015 8:30 AM  Clinical: Sandre Kitty, RN 12/08/2015 8:30 AM  Other: , RN Charge Nurse 12/08/2015 8:30 AM  Other: Hilda Lias, [place], [place] Social Work [phone]

## 2015-12-08 NOTE — [facility] Group Notes (Signed)
[facility] LCSW Group Therapy 12/08/2015 1:15pm  Type of Therapy: Group Therapy- Feelings Around Relapse and Recovery  Pt did not attend, declined invitation.    Chad CordialLauren Carter, Theresia MajorsLCSWA [phone] 12/08/2015 3:42 PM

## 2015-12-08 NOTE — Progress Notes (Signed)
Leah [name] is seen OOB UAL on the [address] hall today...she tolerates this well. She avoids spekaing to staff. SHe minimizes her problems. She avoids eye contact. A She completed her daily assessment and on it she wrote she deneid SI and she rated her depression, hopelessness and anxiety " 0/0/9",respectively. She is scheduled for labwork and notified she can have prn ativan if needed. R Safety in place.

## 2015-12-08 NOTE — [facility] Suicide Risk Assessment (Addendum)
[facility]   Nursing information obtained from:   patient and chart  Demographic factors:   20, single, has three year old child, lives alone,  Son currently with patient's sister, employed .  Current Mental Status:   see below  Loss Factors:   single mother, limited support, being criticized via social media Historical Factors:   one prior psychiatric admission for depression as a teenager, history of self cutting years ago, but not in several years.  Risk Reduction Factors:   resilience, sense of responsibility to her child   Total Time spent with patient: 45 minutes Principal Problem: suicidal attempt  Diagnosis:   Patient Active Problem List   Diagnosis Date Noted  . MDD (major depressive disorder), recurrent episode, moderate (HCC) [F33.1] 12/07/2015     Continued Clinical Symptoms:  Alcohol Use Disorder Identification Test Final Score (AUDIT): 8 The "Alcohol Use Disorders Identification Test", Guidelines for Use in Primary Care, Second Edition.  World Science writerHealth Organization [facility](WHO). Score between 0-7:  no or low risk or alcohol related problems. Score between 8-15:  moderate risk of alcohol related problems. Score between 16-19:  high risk of alcohol related problems. Score 20 or above:  warrants further diagnostic evaluation for alcohol dependence and treatment.   CLINICAL FACTORS:   20 year old female, recently overdosed on OTC analgesic ( NSAID ) . States she took about " two handfuls of ibuprofen". States attempt was not planned out, was impulsive. States she was upset at the time because she was getting texts from an acquaintance essentially teasing her about a dental prosthesis she has and " calling me all sort of things ".  States she sent messages to friends saying goodbye, and that a friend came to see her and got her to hospital. States that before this incident she was not particularly depressed, and was not suicidal . However, she does report  symptoms of depression such as low energy, poor sleep, poor appetite and has lost 5 lbs over recent days. Patient reports one prior admission for depression as a teenager, and describes history of self cutting in the past.  Denies drug abuse, describes history of alcohol abuse, drinks up to bottle of liquor 2-3 times per week.  States last drank 2-3 days ago- admission BAL negative. No current symptoms of WDL.  Dx- Suicide attempt by overdose, alcohol abuse  Plan- inpatient admission- we discussed medication options , agrees to REMERON trial, which may also help sleep and appetite . ATIVAN PRNs to minimize risk of possible alcohol WDL symptoms. Check TSH   Musculoskeletal: Strength & Muscle Tone: within normal limits no current tremors, no restlessness, no diaphoresis Gait & Station: normal Patient leans: N/A  Psychiatric Specialty Exam: ROS denies headache, no visual disturbances, no chest pain, no shortness of breath, no vomiting , no diarrhea  Blood pressure 107/77, pulse 64, temperature 98 F (36.7 C), temperature source Oral, resp. rate 16, height 4\' 11"  (1.499 m), weight 90 lb (40.824 kg), SpO2 99 %.Body mass index is 18.17 kg/(m^2).  General Appearance: Fairly Groomed  Patent attorneyye Contact::  Fair  Speech:  Normal Rate  Volume:  Decreased  Mood:  minimizes depression, but appears depressed, constricted in affect  Affect:  Constricted, although does smile appropriately at times   Thought Process:  Linear  Orientation:  Other:  fully alert and attentive   Thought Content:  no hallucinations, no delusions   Suicidal Thoughts:  No at this time denies any suicidal or self  injurious ideations, denies any homicidal or violent ideations  Homicidal Thoughts:  No  Memory:  recent and remote grossly intact   Judgement:  Fair  Insight:  Fair  Psychomotor Activity:  Normal  Concentration:  Good  Recall:  Good  Fund of Knowledge:Good  Language: Good  Akathisia:  Negative  Handed:  Right  AIMS (if  indicated):     Assets:  Desire for Improvement Physical Health Resilience  Sleep:  Number of Hours: 6.75  Cognition: WNL  ADL's:  Intact    COGNITIVE FEATURES THAT CONTRIBUTE TO RISK:  Closed-mindedness and Loss of executive function    SUICIDE RISK:   Moderate:  Frequent suicidal ideation with limited intensity, and duration, some specificity in terms of plans, no associated intent, good self-control, limited dysphoria/symptomatology, some risk factors present, and identifiable protective factors, including available and accessible social support.  PLAN OF CARE: Patient will be admitted to inpatient psychiatric unit for stabilization and safety. Will provide and encourage milieu participation. Provide medication management and maked adjustments as needed.  Will follow daily.    I certify that inpatient services furnished can reasonably be expected to improve the patient's condition.   Nehemiah Massed, MD 12/08/2015, 12:40 PM

## 2015-12-08 NOTE — Progress Notes (Signed)
Recreation Therapy Notes  Date: 05.19.2017 Time: 9:30am Location: [address] Hall Group Room   Group Topic: Stress Management  Goal Area(s) Addresses:  Patient will actively participate in stress management techniques presented during session.   Behavioral Response: Did not attend.    Marykay Lexenise L Saydi Kobel, LRT/CTRS        Deborah Lazcano L 12/08/2015 1:56 PM

## 2015-12-09 LAB — COMPREHENSIVE METABOLIC PANEL
ALBUMIN: 4 g/dL (ref 3.5–5.0)
ALT: 13 U/L — AB (ref 14–54)
AST: 13 U/L — AB (ref 15–41)
Alkaline Phosphatase: 53 U/L (ref 38–126)
Anion gap: 4 — ABNORMAL LOW (ref 5–15)
BUN: 14 mg/dL (ref 6–20)
CHLORIDE: 108 mmol/L (ref 101–111)
CO2: 27 mmol/L (ref 22–32)
CREATININE: 0.63 mg/dL (ref 0.44–1.00)
Calcium: 9.2 mg/dL (ref 8.9–10.3)
GFR calc non Af Amer: >60 mL/min (ref >60)
GLUCOSE: 91 mg/dL (ref 65–99)
Potassium: 3.9 mmol/L (ref 3.5–5.1)
SODIUM: 139 mmol/L (ref 135–145)
Total Bilirubin: 0.6 mg/dL (ref 0.3–1.2)
Total Protein: 7.3 g/dL (ref 6.5–8.1)

## 2015-12-09 LAB — TSH: TSH: 1.223 u[IU]/mL (ref 0.350–4.500)

## 2015-12-09 NOTE — Progress Notes (Signed)
Patient ID: Leah [name], female   DOB: 12-May-1996, 20 y.o.   MRN: [id number]   Pt currently presents with a flat affect and isolative behavior. Per self inventory, pt rates depression, hopelessness and anxiety at a 0. Pt's daily goal is "to go home" and they intend to do so by "show that I'm not suicidal." Pt reports good sleep, a fair appetite, normal energy and good concentration. Pt remains in bed throughout the day today. Pt took some of her morning medications. Forwards nothing to staff, periods of selective mutism.   Pt provided with medications per providers orders. Pt's labs and vitals were monitored throughout the day. Pt supported emotionally and encouraged to express concerns and questions. Pt educated on medications.  Pt's safety ensured with 15 minute and environmental checks. Pt currently denies SI/HI and A/V hallucinations. Pt verbally agrees to seek staff if SI/HI or A/VH occurs and to consult with staff before acting on any harmful thoughts. Will continue POC.

## 2015-12-09 NOTE — Progress Notes (Signed)
[facility] MD Progress Note  12/09/2015 3:07 PM [place]  MRN:  [id number] Subjective:  Patient reports " I am okay."  Objective: Leah [name] is awake, alert and oriented X3 , found resting in bedroom.  Denies suicidal or homicidal ideation. Denies auditory or visual hallucination and does not appear to be responding to internal stimuli. Patient is guarded, withdrawn and has an flat affect. Patient is only responding to questions with a "yes or no" responds. Patient didn't elaborate with her thoughts or feelings at this time. Patient reports she is medication compliant without mediation side effects. Patient denies depression or depression symptoms at this time 0/10.  Reports good appetite and states she is resting well. Support, encouragement and reassurance was provided.   Principal Problem: Suicide attempt by drug ingestion [facility]) Diagnosis:   Patient Active Problem List   Diagnosis Date Noted  . Suicide attempt by drug ingestion ([place]) [T50.902A]   . MDD (major depressive disorder), recurrent episode, moderate ([place]) [F33.1] 12/07/2015   Total Time spent with patient: 30 minutes  Past Psychiatric History: See Above  Past Medical History: History reviewed. No pertinent past medical history.  Past Surgical History  Procedure Laterality Date  . Cholecystectomy     Family History: History reviewed. No pertinent family history. Family Psychiatric  History: See H&P Social History:  History  Alcohol Use  . 3.6 oz/week  . 6 Cans of beer per week     History  Drug Use Not on file    Social History   Social History  . Marital Status: Single    Spouse Name: N/A  . Number of Children: N/A  . Years of Education: N/A   Social History Main Topics  . Smoking status: Never Smoker   . Smokeless tobacco: None  . Alcohol Use: 3.6 oz/week    6 Cans of beer per week  . Drug Use: None  . Sexual Activity: Yes    Birth Control/ Protection: IUD   Other Topics Concern  . None   Social  History Narrative   Additional Social History:                         Sleep: Fair  Appetite:  Fair  Current Medications: Current Facility-Administered Medications  Medication Dose Route Frequency Provider Last Rate Last Dose  . acetaminophen (TYLENOL) tablet 650 mg  650 mg Oral Q6H PRN Benjamine Mola, FNP   650 mg at 12/07/15 1840  . alum & mag hydroxide-simeth (MAALOX/MYLANTA) 200-200-20 MG/5ML suspension 30 mL  30 mL Oral Q4H PRN Benjamine Mola, FNP      . benzocaine (ORAJEL) 10 % mucosal gel   Mouth/Throat TID PRN Lurena Nida, NP      . hydrOXYzine (ATARAX/VISTARIL) tablet 25 mg  25 mg Oral Q6H PRN Jenne Campus, MD      . loperamide (IMODIUM) capsule 2-4 mg  2-4 mg Oral PRN Jenne Campus, MD      . LORazepam (ATIVAN) tablet 1 mg  1 mg Oral Q6H PRN Myer Peer Cobos, MD      . magnesium hydroxide (MILK OF MAGNESIA) suspension 30 mL  30 mL Oral Daily PRN Benjamine Mola, FNP      . mirtazapine (REMERON) tablet 15 mg  15 mg Oral QHS Jenne Campus, MD   15 mg at 12/08/15 2202  . multivitamin with minerals tablet 1 tablet  1 tablet Oral Daily Fernando A Cobos,  MD   1 tablet at 12/09/15 0859  . ondansetron (ZOFRAN-ODT) disintegrating tablet 4 mg  4 mg Oral Q6H PRN Myer Peer Cobos, MD      . potassium chloride SA (K-DUR,KLOR-CON) CR tablet 20 mEq  20 mEq Oral BID Jenne Campus, MD   20 mEq at 12/08/15 1933  . thiamine (B-1) injection 100 mg  100 mg Intramuscular Once Jenne Campus, MD      . thiamine (VITAMIN B-1) tablet 100 mg  100 mg Oral Daily Jenne Campus, MD   100 mg at 12/09/15 8546    Lab Results:  Results for orders placed or performed during the hospital encounter of 12/07/15 (from the past 48 hour(s))  TSH     Status: None   Collection Time: 12/09/15  6:33 AM  Result Value Ref Range   TSH 1.223 0.350 - 4.500 uIU/mL    Comment: Performed at [facility]  Comprehensive metabolic panel     Status: Abnormal   Collection Time:  12/09/15  6:33 AM  Result Value Ref Range   Sodium 139 135 - 145 mmol/L   Potassium 3.9 3.5 - 5.1 mmol/L   Chloride 108 101 - 111 mmol/L   CO2 27 22 - 32 mmol/L   Glucose, Bld 91 65 - 99 mg/dL   BUN 14 6 - 20 mg/dL   Creatinine, Ser 0.63 0.44 - 1.00 mg/dL   Calcium 9.2 8.9 - 10.3 mg/dL   Total Protein 7.3 6.5 - 8.1 g/dL   Albumin 4.0 3.5 - 5.0 g/dL   AST 13 (L) 15 - 41 U/L   ALT 13 (L) 14 - 54 U/L   Alkaline Phosphatase 53 38 - 126 U/L   Total Bilirubin 0.6 0.3 - 1.2 mg/dL   GFR calc non Af Amer >60 >60 mL/min   GFR calc Af Amer >60 >60 mL/min    Comment: (NOTE) The eGFR has been calculated using the CKD EPI equation. This calculation has not been validated in all clinical situations. eGFR's persistently <60 mL/min signify possible Chronic Kidney Disease.    Anion gap 4 (L) 5 - 15    Comment: Performed at [facility]    Blood Alcohol level:  Lab Results  Component Value Date   [facility] <5 12/07/2015    Physical Findings: AIMS: Facial and Oral Movements Muscles of Facial Expression: None, normal Lips and Perioral Area: None, normal Jaw: None, normal Tongue: None, normal,Extremity Movements Upper (arms, wrists, hands, fingers): None, normal Lower (legs, knees, ankles, toes): None, normal, Trunk Movements Neck, shoulders, hips: None, normal, Overall Severity Severity of abnormal movements (highest score from questions above): None, normal Incapacitation due to abnormal movements: None, normal Patient's awareness of abnormal movements (rate only patient's report): No Awareness, Dental Status Current problems with teeth and/or dentures?: No Does patient usually wear dentures?: No  CIWA:  CIWA-Ar Total: 0 COWS:     Musculoskeletal: Strength & Muscle Tone: within normal limits Gait & Station: normal Patient leans: N/A  Psychiatric Specialty Exam: Review of Systems  Psychiatric/Behavioral: Positive for depression and substance abuse. Negative for  suicidal ideas and hallucinations. The patient is nervous/anxious.   All other systems reviewed and are negative.   Blood pressure 106/57, pulse 129, temperature 98.6 F (37 C), temperature source Oral, resp. rate 18, height '4\' 11"'$  (1.499 m), weight 40.824 kg (90 lb), SpO2 99 %.Body mass index is 18.17 kg/(m^2).  General Appearance: Guarded  Eye Contact::  Minimal  Speech:  Clear and Coherent  Volume:  Decreased  Mood:  Depressed  Affect:  Depressed and Flat  Thought Process:  Logical  Orientation:  Full (Time, Place, and Person)  Thought Content:  Hallucinations: None  Suicidal Thoughts:  No  Homicidal Thoughts:  Yes.  with intent/plan  Memory:  Immediate;   Fair Recent;   Fair Remote;   Fair  Judgement:  Poor  Insight:  Lacking  Psychomotor Activity:  Restlessness  Concentration:  Poor  Recall:  [employer] of Knowledge:Fair  Language: Good  Akathisia:  No  Handed:  Right  AIMS (if indicated):     Assets:  Desire for Improvement Social Support  ADL's:  Intact  Cognition: WNL  Sleep:  Number of Hours: 6.5    I agree with current treatment plan on 12/09/2015, Patient seen face-to-face for psychiatric evaluation follow-up, chart reviewed. Reviewed the information documented and agree with the treatment plan.  Treatment Plan Summary: Daily contact with patient to assess and evaluate symptoms and progress in treatment and Medication management  1-Admit for crisis management and stabilization. Estimated length of stay 5-7 days.  2-Individual and group therapy encouraged  3-Medication management for depression and anxiety to reduce current symptoms to base line and improve the patient's overall level of functioning: Medications reviewed:  Continue Mirtazapine 53m po daily for depression. Continue CIWA protocol.    4-Coping skills for depression, substance abuse, anger issues, and anxiety developing--  5-Continue crisis stabilization and management  6-Address health  issues--monitoring vital signs, stable  7-Treatment plan in progress to prevent relapse of depression, angry outbursts, and anxiety  8-Psychosocial education regarding relapse prevention and self-care  9-Health care follow up as needed for any health concerns  10-Call for consult with hospitalist for additional specialty patient services as needed.  TDerrill Center NP 12/09/2015, 3:07 PM

## 2015-12-09 NOTE — [facility] Group Notes (Signed)
  [facility] LCSW Group Therapy Note 12/09/2015 at 10:10 AM  Type of Therapy and Topic:  Group Therapy: Avoiding Self-Sabotaging and Enabling Behaviors  Participation Level:  Did Not Attend; encouraged to attend yet choose not to    Carney Bernatherine C Harrill, LCSW

## 2015-12-09 NOTE — Progress Notes (Signed)
Patient ID: Leah [name], female   DOB: 05/08/1996, 20 y.o.   MRN: [id number]  Adult Psychoeducational Group Note  Date:  12/09/2015 Time:  01:30pm   Group Topic/Focus:  Developing a Wellness Toolbox:   The focus of this group is to help patients develop a "wellness toolbox" with skills and strategies to promote recovery upon discharge. FOCUS on exploring different perspectives.   Participation Level:  Did Not Attend  Participation Quality: n/a  Affect: n/a  Cognitive: n/a  Insight: n/a  Engagement in Group: n/a  Modes of Intervention:  Activity, Discussion, Education and Support  Additional Comments:  Pt did not attend group, pt in bed asleep.   Aurora Maskwyman, Odie Edmonds E 12/09/2015, 4:15 PM

## 2015-12-09 NOTE — Progress Notes (Signed)
Adult Psychoeducational Group Note  Date:  12/09/2015 Time:  10:45 PM  Group Topic/Focus:  Wrap-Up Group:   The focus of this group is to help patients review their daily goal of treatment and discuss progress on daily workbooks.  Participation Level:  Minimal  Participation Quality:  Appropriate  Affect:  Appropriate  Cognitive:  Alert  Insight: Appropriate  Engagement in Group:  Lacking  Modes of Intervention:  Discussion  Additional Comments:  Pt rated in her day 10/10. Two positive coping skills: reading and singing.   Kaleen OdeaCOOKE, Bettymae Yott R 12/09/2015, 10:45 PM

## 2015-12-09 NOTE — Plan of Care (Signed)
Problem: Medication: Goal: Compliance with prescribed medication regimen will improve Outcome: Progressing Client will take medications AEB writer reviewing medications, administering as prescribed.

## 2015-12-10 MED ORDER — ENSURE ENLIVE PO LIQD
237.0000 mL | Freq: Two times a day (BID) | ORAL | Status: DC
  Administered 2015-12-11: 237 mL via ORAL

## 2015-12-10 NOTE — Progress Notes (Signed)
Patient ID: Leah [name], female   DOB: 1995-08-01, 20 y.o.   MRN: [id number]  DAR: Pt. Denies SI/HI and A/V Hallucinations. She reports sleep is good, appetite is fair, energy level is normal, and concentration is good. She rates depression, anxiety, and hopelessness 0/10. Patient does report pain in tooth and received Orajel which provided relief. Support and encouragement provided to the patient. Scheduled medications administered to patient per physician's orders. Patient is cooperative and pleasant in mood. She is able to come to Clinical research associatewriter with questions or concerns. She reports poor appetite and states, "I've never been able to gain weight." Nutritional supplement was ordered per protocol. Q15 minute checks are maintained for safety.

## 2015-12-10 NOTE — Progress Notes (Signed)
[facility] MD Progress Note  12/10/2015 10:18 AM Leah [name]  MRN:  [id number] Subjective:  Patient reports " I am feeling better today. I am ready to leave ."  Objective: Leah [name] is awake, alert and oriented X4, found sitting in the hallways.  Denies suicidal or homicidal ideation. Denies auditory or visual hallucination and does not appear to be responding to internal stimuli. Patient is pleasant with a bright affect today. Patient reports she is feeling better and has a better plan to deal with peer pressure. Patient reports she will not pay attention to social media. Patient reports she is medication compliant without mediation side effects. Patient denies depression or depression symptoms at this time 0/10. Reports good appetite and states she is resting well. Support, encouragement and reassurance was provided.   Principal Problem: Suicide attempt by drug ingestion [facility]) Diagnosis:   Patient Active Problem List   Diagnosis Date Noted  . Suicide attempt by drug ingestion ([place]) [T50.902A]   . MDD (major depressive disorder), recurrent episode, moderate ([place]) [F33.1] 12/07/2015   Total Time spent with patient: 30 minutes  Past Psychiatric History: See Above  Past Medical History: History reviewed. No pertinent past medical history.  Past Surgical History  Procedure Laterality Date  . Cholecystectomy     Family History: History reviewed. No pertinent family history. Family Psychiatric  History: See H&P Social History:  History  Alcohol Use  . 3.6 oz/week  . 6 Cans of beer per week     History  Drug Use Not on file    Social History   Social History  . Marital Status: Single    Spouse Name: N/A  . Number of Children: N/A  . Years of Education: N/A   Social History Main Topics  . Smoking status: Never Smoker   . Smokeless tobacco: None  . Alcohol Use: 3.6 oz/week    6 Cans of beer per week  . Drug Use: None  . Sexual Activity: Yes    Birth Control/ Protection: IUD    Other Topics Concern  . None   Social History Narrative   Additional Social History:                         Sleep: Fair  Appetite:  Fair  Current Medications: Current Facility-Administered Medications  Medication Dose Route Frequency Provider Last Rate Last Dose  . acetaminophen (TYLENOL) tablet 650 mg  650 mg Oral Q6H PRN Benjamine Mola, FNP   650 mg at 12/07/15 1840  . alum & mag hydroxide-simeth (MAALOX/MYLANTA) 200-200-20 MG/5ML suspension 30 mL  30 mL Oral Q4H PRN Benjamine Mola, FNP      . benzocaine (ORAJEL) 10 % mucosal gel   Mouth/Throat TID PRN Lurena Nida, NP      . hydrOXYzine (ATARAX/VISTARIL) tablet 25 mg  25 mg Oral Q6H PRN Jenne Campus, MD      . loperamide (IMODIUM) capsule 2-4 mg  2-4 mg Oral PRN Jenne Campus, MD      . LORazepam (ATIVAN) tablet 1 mg  1 mg Oral Q6H PRN Myer Peer Cobos, MD      . magnesium hydroxide (MILK OF MAGNESIA) suspension 30 mL  30 mL Oral Daily PRN Benjamine Mola, FNP      . mirtazapine (REMERON) tablet 15 mg  15 mg Oral QHS Jenne Campus, MD   15 mg at 12/09/15 2126  . multivitamin with minerals tablet 1  tablet  1 tablet Oral Daily Jenne Campus, MD   1 tablet at 12/10/15 0840  . ondansetron (ZOFRAN-ODT) disintegrating tablet 4 mg  4 mg Oral Q6H PRN Jenne Campus, MD      . thiamine (B-1) injection 100 mg  100 mg Intramuscular Once Jenne Campus, MD      . thiamine (VITAMIN B-1) tablet 100 mg  100 mg Oral Daily Jenne Campus, MD   100 mg at 12/10/15 0840    Lab Results:  Results for orders placed or performed during the hospital encounter of 12/07/15 (from the past 48 hour(s))  TSH     Status: None   Collection Time: 12/09/15  6:33 AM  Result Value Ref Range   TSH 1.223 0.350 - 4.500 uIU/mL    Comment: Performed at [facility]  Comprehensive metabolic panel     Status: Abnormal   Collection Time: 12/09/15  6:33 AM  Result Value Ref Range   Sodium 139 135 - 145 mmol/L    Potassium 3.9 3.5 - 5.1 mmol/L   Chloride 108 101 - 111 mmol/L   CO2 27 22 - 32 mmol/L   Glucose, Bld 91 65 - 99 mg/dL   BUN 14 6 - 20 mg/dL   Creatinine, Ser 0.63 0.44 - 1.00 mg/dL   Calcium 9.2 8.9 - 10.3 mg/dL   Total Protein 7.3 6.5 - 8.1 g/dL   Albumin 4.0 3.5 - 5.0 g/dL   AST 13 (L) 15 - 41 U/L   ALT 13 (L) 14 - 54 U/L   Alkaline Phosphatase 53 38 - 126 U/L   Total Bilirubin 0.6 0.3 - 1.2 mg/dL   GFR calc non Af Amer >60 >60 mL/min   GFR calc Af Amer >60 >60 mL/min    Comment: (NOTE) The eGFR has been calculated using the CKD EPI equation. This calculation has not been validated in all clinical situations. eGFR's persistently <60 mL/min signify possible Chronic Kidney Disease.    Anion gap 4 (L) 5 - 15    Comment: Performed at [facility]    Blood Alcohol level:  Lab Results  Component Value Date   [facility] <5 12/07/2015    Physical Findings: AIMS: Facial and Oral Movements Muscles of Facial Expression: None, normal Lips and Perioral Area: None, normal Jaw: None, normal Tongue: None, normal,Extremity Movements Upper (arms, wrists, hands, fingers): None, normal Lower (legs, knees, ankles, toes): None, normal, Trunk Movements Neck, shoulders, hips: None, normal, Overall Severity Severity of abnormal movements (highest score from questions above): None, normal Incapacitation due to abnormal movements: None, normal Patient's awareness of abnormal movements (rate only patient's report): No Awareness, Dental Status Current problems with teeth and/or dentures?: No Does patient usually wear dentures?: No  CIWA:  CIWA-Ar Total: 0 COWS:     Musculoskeletal: Strength & Muscle Tone: within normal limits Gait & Station: normal Patient leans: N/A  Psychiatric Specialty Exam: Review of Systems  Psychiatric/Behavioral: Positive for depression and substance abuse. Negative for suicidal ideas and hallucinations. The patient is nervous/anxious.   All other  systems reviewed and are negative.   Blood pressure 117/74, pulse 75, temperature 98.2 F (36.8 C), temperature source Oral, resp. rate 16, height 4' 11"  (1.499 m), weight 40.824 kg (90 lb), SpO2 99 %.Body mass index is 18.17 kg/(m^2).  General Appearance: Casual  Eye Contact::  Good  Speech:  Clear and Coherent  Volume:  Normal  Mood:  Euthymic  Affect:  Congruent  Thought Process:  Logical  Orientation:  Full (Time, Place, and Person)  Thought Content:  Hallucinations: None  Suicidal Thoughts:  No  Homicidal Thoughts:  Yes.  with intent/plan  Memory:  Immediate;   Fair Recent;   Fair Remote;   Fair  Judgement:  Poor  Insight:  Lacking  Psychomotor Activity:  Restlessness  Concentration:  Poor  Recall:  [employer] of Knowledge:Fair  Language: Good  Akathisia:  No  Handed:  Right  AIMS (if indicated):     Assets:  Desire for Improvement Social Support  ADL's:  Intact  Cognition: WNL  Sleep:  Number of Hours: 6.5    I agree with current treatment plan on 12/10/2015, Patient seen face-to-face for psychiatric evaluation follow-up, chart reviewed. Reviewed the information documented and agree with the treatment plan.  Treatment Plan Summary: Daily contact with patient to assess and evaluate symptoms and progress in treatment and Medication management  1-Admit for crisis management and stabilization. Estimated length of stay 5-7 days.  2-Individual and group therapy encouraged  3-Medication management for depression and anxiety to reduce current symptoms to base line and improve the patient's overall level of functioning: Medications reviewed:  Continue Mirtazapine 28m po daily for depression. Continue CIWA protocol.   4-Coping skills for depression, substance abuse, anger issues, and anxiety developing--  5-Continue crisis stabilization and management  6-Address health issues--monitoring vital signs, stable  7-Treatment plan in progress to prevent relapse of  depression, angry outbursts, and anxiety  8-Psychosocial education regarding relapse prevention and self-care  9-Health care follow up as needed for any health concerns  10-Call for consult with hospitalist for additional specialty patient services as needed.  TDerrill Center NP 12/10/2015, 10:18 AM

## 2015-12-10 NOTE — Progress Notes (Signed)
D: Patient at the cafeteria with visitors during the shift change. Cheerful upon approach. Verbalizes no concern. Denies pain, SI, AH/VH at this time. No behavioral issues noted.  A: staff offered support and encouragement. Due/prn meds given as ordered. Every 15 minutes check for safety maintained. Will continue to monitor patient for safety and stability. R: patient remains safe.

## 2015-12-10 NOTE — Progress Notes (Signed)
Patient ID: Leah [name], female   DOB: 15-May-1996, 20 y.o.   MRN: [id number] D: Client in bed initially was mildly confused upon awakening, but shortly thereafter client was laughing, interacting, animated, concerned about weight "I think I done gained some" Client weight by MHT weight was 97 lbs. "I gained seven" A: Writer provided emotional support, medications reviewed, administered as ordered. R: Client is safe on the unit, attended group.

## 2015-12-10 NOTE — [facility] Group Notes (Signed)
[facility] LCSW Group Therapy Note   12/10/2015  10:30 AM   Type of Therapy and Topic: Group Therapy: Feelings Around Returning Home & Establishing a Supportive Framework\  Participation Level: Active   Description of Group:  Patients first processed thoughts and feelings about up coming discharge. These included fears of upcoming changes, lack of change, new living environments, judgements and expectations from others and overall stigma of MH issues. We then discussed what is a supportive framework? What does it look like feel like and how do I discern it from and unhealthy non-supportive network? Learn how to cope when supports are not helpful and don't support you. Discuss what to do when your family/friends are not supportive.   Summary of Patient Progress:  Pt engaged easily during group session. As patients processed their anxiety about discharge and described healthy supports patient shared that she realizes that self care will be one of her biggest challenges upon discharge. Patient took notes during description of HALT for self care; otherwise pt was quietly attentive.   Carney Bernatherine C Makenleigh Crownover, LCSW

## 2015-12-10 NOTE — [facility] Group Notes (Signed)
Patient attend group her day was a 10. Patient reach goal she's going home tomorrow.

## 2015-12-10 NOTE — [facility] Group Notes (Signed)
[facility] Group Notes:  (Nursing/MHT/Case Management/Adjunct)  Date:  12/10/2015  Time:  4:00 PM  Type of Therapy:  Psychoeducational Skills  Participation Level:  Did Not Attend  Participation Quality:  N/A  Affect:  N/A  Cognitive:  N/A  Insight:  None  Engagement in Group:  None  Modes of Intervention:  Activity, Discussion, Education and Support  Summary of Progress/Problems: Patient was invited to group but did not attend.   Ned ClinesDopson, Heily Carlucci E 12/10/2015, 4:00 PM

## 2015-12-11 DIAGNOSIS — T39312A Poisoning by propionic acid derivatives, intentional self-harm, initial encounter: Secondary | ICD-10-CM

## 2015-12-11 DIAGNOSIS — T1491 Suicide attempt: Secondary | ICD-10-CM

## 2015-12-11 DIAGNOSIS — T391X2A Poisoning by 4-Aminophenol derivatives, intentional self-harm, initial encounter: Secondary | ICD-10-CM

## 2015-12-11 MED ORDER — MIRTAZAPINE 15 MG PO TABS: 15.0000 mg | ORAL_TABLET | Freq: Every day | ORAL | Status: DC

## 2015-12-11 MED ORDER — HYDROXYZINE HCL 25 MG PO TABS: 25.0000 mg | ORAL_TABLET | Freq: Four times a day (QID) | ORAL | Status: DC | PRN

## 2015-12-11 NOTE — Tx Team (Signed)
Interdisciplinary Treatment Plan Update (Adult) Date: 12/11/2015   Date: 12/11/2015 10:59 AM  Progress in Treatment:  Attending groups: No Participating in groups: No  Taking medication as prescribed: Yes  Tolerating medication: Yes  Family/Significant othe contact made: Yes, CSW provided patient with emotional support and encouragement.  Patient understands diagnosis: Continuing to assess Discussing patient identified problems/goals with staff: Yes  Medical problems stabilized or resolved: Yes  Denies suicidal/homicidal ideation: Yes, Patient denies  Patient has not harmed self or Others: Yes   New problem(s) identified: None identified at this time.   Discharge Plan or Barriers: Patient plans to return home to follow up with outpatient services.   Additional comments:  Patient and CSW reviewed pt's identified goals and treatment plan. Patient verbalized understanding and agreed to treatment plan. CSW reviewed [facility] "Discharge Process and Patient Involvement" Form. Pt verbalized understanding of information provided and signed form.   Reason for Continuation of Hospitalization:  Anxiety Depression Medication stabilization Suicidal ideation  Estimated length of stay: Discharge anticipated for today 12/11/15.  Review of initial/current patient goals per problem list:   1.  Goal(s): Patient will participate in aftercare plan  Met:  Yes  Target date: 3-5 days from date of admission   As evidenced by: Patient will participate within aftercare plan AEB aftercare provider and housing plan at discharge being identified.  12/08/15: CSW to work with Pt to assess for appropriate discharge plan and faciliate appointments and referrals as needed prior to d/c. 5/22: Goal met. Patient plans to return home to follow up with outpatient services.   2.  Goal (s): Patient will exhibit decreased depressive symptoms and suicidal ideations.  Met:  Adequate for discharge per MD  Target date: 3-5  days from date of admission   As evidenced by: Patient will utilize self rating of depression at 3 or below and demonstrate decreased signs of depression or be deemed stable for discharge by MD.  12/08/15: Pt was admitted with symptoms of depression, rating 10/10. Pt continues to present with flat affect and depressive symptoms.  Pt will demonstrate decreased symptoms of depression and rate depression at 3/10 or lower prior to discharge.  5/22: Adequate for discharge per MD. Patient reports improvements in her symptoms, reports feeling safe for discharge today to follow up with outpatient services. Denies SI.    3.  Goal(s): Patient will demonstrate decreased signs and symptoms of anxiety.  Met:  Adequate for discharge per MD  Target date: 3-5 days from date of admission   As evidenced by: Patient will utilize self rating of anxiety at 3 or below and demonstrated decreased signs of anxiety, or be deemed stable for discharge by MD  12/08/15: Pt was admitted with increased levels of anxiety and is currently rating those symptoms highly. Pt will demonstrated decreased symptoms of anxiety and rate it at 3/10 prior to d/c.   5/22: Adequate for discharge per MD. Patient reports improvements in her symptoms, reports feeling safe for discharge today to follow up with outpatient services. Denies SI.    Attendees:  Patient:    Family:    Physician: Dr. Parke Poisson, MD  12/11/2015 10:59 AM  Nursing:  12/11/2015 10:59 AM  Clinical Social Worker Andrik Sandt, LCSW  12/11/2015 10:59 AM  Other:  12/11/2015 10:59 AM  Clinical: Alyssa Grove, RN  12/11/2015 10:59 AM  Other: Larose Kells, NP 12/11/2015 10:59 AM  Other:     Tilden Fossa, LCSW Clinical Social Worker [place]  [facility] [phone]

## 2015-12-11 NOTE — [facility] Suicide Risk Assessment (Signed)
[facility]   Principal Problem: Suicide attempt by drug ingestion [facility](HCC) Discharge Diagnoses:  Patient Active Problem List   Diagnosis Date Noted  . Suicide attempt by drug ingestion ([facility]) [T50.902A]   . MDD (major depressive disorder), recurrent episode, moderate ([facility]) [F33.1] 12/07/2015    Total Time spent with patient: 30 minutes  Musculoskeletal: Strength & Muscle Tone: within normal limits Gait & Station: normal Patient leans: N/A  Psychiatric Specialty Exam: ROS  Blood pressure 100/62, pulse 93, temperature 97.3 F (36.3 C), temperature source Oral, resp. rate 16, height 4\' 11"  (1.499 m), weight 90 lb (40.824 kg), SpO2 99 %.Body mass index is 18.17 kg/(m^2).  General Appearance: improved grooming   Eye Contact::  Good  Speech:  Normal Rate409  Volume:  Normal  Mood:  improved mood - denies depression at this time    Affect:  appropriate , more reactive, smiles at times appropriately   Thought Process:  Linear  Orientation:  Full (Time, Place, and Person)  Thought Content:  denies hallucinations, no delusions, not internally preoccupied   Suicidal Thoughts:  No denies any suicidal ideations, denies any self injurious ideations   Homicidal Thoughts:  No denies any homicidal or violent ideations   Memory:  recent and remote grossly intact   Judgement:  Improved   Insight:   Improved   Psychomotor Activity:  Normal  Concentration:  Good  Recall:  Good  Fund of Knowledge:Good  Language: Good  Akathisia:  Negative  Handed:  Right  AIMS (if indicated):     Assets:  Desire for Improvement Resilience  Sleep:  Number of Hours: 6.75  Cognition: WNL  ADL's:  Intact   Mental Status Per Nursing Assessment::   On Admission:     Demographic Factors:  20 year old employed female   Loss Factors: Limited support network  Historical Factors: History of depression   Risk Reduction Factors:   Responsible for children under 21 years of age,  Sense of responsibility to family, Employed and Positive coping skills or problem solving skills  Continued Clinical Symptoms:  At this time patient reports feeling better and currently denies significant depression, affect is more reactive, no thought disorder, no SI or HI, no psychotic symptoms, future oriented, plans to return to work later this week  Cognitive Features That Contribute To Risk:  No gross cognitive deficits noted upon discharge. Is alert , attentive, and oriented x 3  Suicide Risk:  Mild:  Suicidal ideation of limited frequency, intensity, duration, and specificity.  There are no identifiable plans, no associated intent, mild dysphoria and related symptoms, good self-control (both objective and subjective assessment), few other risk factors, and identifiable protective factors, including available and accessible social support.  Follow-up Information    [facility]   Specialty:  Professional Counselor   Why:  Walk-in clinic on Wednesday May 24th at 8am for assessment for therapy and medication management services.    Contact information:   [facility] [address] [place] [address] [phone]       Plan Of Care/Follow-up recommendations:  Activity:  as tolerated  Diet:  Regular Tests:  NA Other:  see below  Patient is requesting discharge. States family member will pick her up from hospital later this afternoon Plans to return home . Follow up as above  Nehemiah MassedOBOS, FERNANDO, MD 12/11/2015, 1:06 PM

## 2015-12-11 NOTE — [facility] Suicide Risk Assessment (Signed)
[facility] INPATIENT:  Family/Significant Other Suicide Prevention Education  Suicide Prevention Education:  Education Completed; mother Creola CornKim [name] [phone],  (name of family member/significant other) has been identified by the patient as the family member/significant other with whom the patient will be residing, and identified as the person(s) who will aid the patient in the event of a mental health crisis (suicidal ideations/suicide attempt).  With written consent from the patient, the family member/significant other has been provided the following suicide prevention education, prior to the and/or following the discharge of the patient.  The suicide prevention education provided includes the following:  Suicide risk factors  Suicide prevention and interventions  National Suicide Hotline telephone number  [facility] assessment telephone number  [facility] [place] Emergency Assistance 911  [facility] and/or Residential Mobile Crisis Unit telephone number  Request made of family/significant other to:  Remove weapons (e.g., guns, rifles, knives), all items previously/currently identified as safety concern.    Remove drugs/medications (over-the-counter, prescriptions, illicit drugs), all items previously/currently identified as a safety concern.  The family member/significant other verbalizes understanding of the suicide prevention education information provided.  The family member/significant other agrees to remove the items of safety concern listed above.  Taylee Gunnells, [place] CarboKristin L 12/11/2015, 10:58 AM

## 2015-12-11 NOTE — Progress Notes (Signed)
Recreation Therapy Notes  Date: 05.22.2017 Time: 9:30am Location: [address] Group Room   Group Topic: Stress Management  Goal Area(s) Addresses:  Patient will actively participate in stress management techniques presented during session.   Behavioral Response: Did not attend.   Delvon Chipps L Lauranne Beyersdorf, LRT/CTRS        Xeng Kucher L 12/11/2015 12:00 PM 

## 2015-12-11 NOTE — Discharge Summary (Signed)
Physician Discharge Summary Note  Patient:  Leah [name] is an 20 y.o., female MRN:  [id number] DOB:  1995/12/29 Patient phone:  [phone] (home)  Patient address:   [address] [place] [address],  Total Time spent with patient: 30 minutes  Date of Admission:  12/07/2015 Date of Discharge: 12/11/2015  Reason for Admission:  "OD on Ibuprofen and Excedrin. I took 2 handfuls of Ibuprofen and 4 Excedrin. Somebody sent me something where someone was talking about me. I got upset"  Principal Problem: Suicide attempt by drug ingestion [facility]) Discharge Diagnoses: Patient Active Problem List   Diagnosis Date Noted  . Suicide attempt by drug ingestion (HCC) [T50.902A]   . MDD (major depressive disorder), recurrent episode, moderate (HCC) [F33.1] 12/07/2015   Past Psychiatric History:  See above noted   Past Medical History: History reviewed. No pertinent past medical history.  Past Surgical History  Procedure Laterality Date  . Cholecystectomy     Family History: History reviewed. No pertinent family history. Family Psychiatric  History: denied Social History:  History  Alcohol Use  . 3.6 oz/week  . 6 Cans of beer per week     History  Drug Use Not on file    Social History   Social History  . Marital Status: Single    Spouse Name: N/A  . Number of Children: N/A  . Years of Education: N/A   Social History Main Topics  . Smoking status: Never Smoker   . Smokeless tobacco: None  . Alcohol Use: 3.6 oz/week    6 Cans of beer per week  . Drug Use: None  . Sexual Activity: Yes    Birth Control/ Protection: IUD   Other Topics Concern  . None   Social History Narrative    Hospital Course:   Leah ROX MCGRIFF was admitted for Suicide attempt by drug ingestion [facility]) and crisis management.  She was treated with medications and their indications listed below.  Medical problems were identified and treated as needed.  Home medications were restarted as  appropriate.  Improvement was monitored by observation and [name] daily report of symptom reduction.  Emotional and mental status was monitored by daily self inventory reports completed by [name] and clinical staff.  Patient reported continued improvement, denied any new concerns.  Patient had been compliant on medications and denied side effects.  Support and encouragement was provided.    Patient did well during inpatient stay.  At time of discharge, patient rated both depression and anxiety levels to be manageable and minimal.  Patient was able to identify the triggers of emotional crises and de-stabilizations.  Patient identified the positive things in life that would help in dealing with feelings of loss, depression and unhealthy or abusive tendencies.         Leah CHERYE GAERTNER was evaluated by the treatment team for stability and plans for continued recovery upon discharge.  She was offered further treatment options upon discharge including Residential, Intensive Outpatient and Outpatient treatment.  She will follow up with  for medication management and counseling.  Encouraged patient to maintain satisfactory support network and home environment.  Advised to adhere to medication compliance and outpatient treatment follow up.      Leah ROSAELENA KEMNITZ motivation was an integral factor for scheduling further treatment.  Employment, transportation, bed availability, health status, family support, and any pending legal issues were also considered during her hospital stay.  Upon completion of this admission the patient  was both mentally and medically stable for discharge denying suicidal/homicidal ideation, auditory/visual/tactile hallucinations, delusional thoughts and paranoia.      Physical Findings: AIMS: Facial and Oral Movements Muscles of Facial Expression: None, normal Lips and Perioral Area: None, normal Jaw: None, normal Tongue: None, normal,Extremity Movements Upper (arms, wrists, hands,  fingers): None, normal Lower (legs, knees, ankles, toes): None, normal, Trunk Movements Neck, shoulders, hips: None, normal, Overall Severity Severity of abnormal movements (highest score from questions above): None, normal Incapacitation due to abnormal movements: None, normal Patient's awareness of abnormal movements (rate only patient's report): No Awareness, Dental Status Current problems with teeth and/or dentures?: No Does patient usually wear dentures?: No  CIWA:  CIWA-Ar Total: 0 COWS:     Musculoskeletal: Strength & Muscle Tone: within normal limits Gait & Station: normal Patient leans: N/A  Psychiatric Specialty Exam: Physical Exam  Vitals reviewed.   Review of Systems  Psychiatric/Behavioral: Negative for suicidal ideas and hallucinations. Depression: improving.  All other systems reviewed and are negative.   Blood pressure 100/62, pulse 93, temperature 97.3 F (36.3 C), temperature source Oral, resp. rate 16, height  (1.499 m), weight 40.824 kg (90 lb), SpO2 99 %.Body mass index is 18.17 kg/(m^2).    Have you used any form of tobacco in the last 30 days? (Cigarettes, Smokeless Tobacco, Cigars, and/or Pipes): No  Has this patient used any form of tobacco in the last 30 days? (Cigarettes, Smokeless Tobacco, Cigars, and/or Pipes) Yes, N/A  Blood Alcohol level:  Lab Results  Component Value Date   ETH <5 12/07/2015    Metabolic Disorder Labs:  No results found for: HGBA1C, MPG No results found for: PROLACTIN No results found for: CHOL, TRIG, HDL, CHOLHDL, VLDL, LDLCALC  See Psychiatric Specialty Exam and Suicide Risk Assessment completed by Attending Physician prior to discharge.  Discharge destination:  Home  Is patient on multiple antipsychotic therapies at discharge:  No   Has Patient had three or more failed trials of antipsychotic monotherapy by history:  No  Recommended Plan for Multiple Antipsychotic Therapies: NA     Medication List     TAKE these medications      Indication   hydrOXYzine 25 MG tablet  Commonly known as:  ATARAX/VISTARIL  Take 1 tablet (25 mg total) by mouth every 6 (six) hours as needed (anxiety/agitation or CIWA < or = 10).   Indication:  Anxiety Neurosis     mirtazapine 15 MG tablet  Commonly known as:  REMERON  Take 1 tablet (15 mg total) by mouth at bedtime.   Indication:  Trouble Sleeping, Major Depressive Disorder           Follow-up Information    Follow up with [facility].   Specialty:  Behavioral Health   Why:  You may walk-in between 8am-3pm Monday-Friday to be evalauted for medication management and therapy.   [address] [place] [address] [phone]       Follow-up recommendations:  Activity:  as tol Diet:  as tol  Comments:  1.  Take all your medications as prescribed.   2.  Report any adverse side effects to outpatient provider. 3.  Patient instructed to not use alcohol or illegal drugs while on prescription medicines. 4.  In the event of worsening symptoms, instructed patient to call 911, the crisis hotline or go to nearest emergency room for evaluation of symptoms.  Signed: Lindwood Qua, NP [facility] 12/11/2015, 10:34 AM   Patient seen,  Suicide Assessment Completed.  Disposition Plan Reviewed

## 2015-12-11 NOTE — Progress Notes (Signed)
  [facility] :  Will you be returning to the same living situation after discharge:  Yes,  patient will return home At discharge, do you have transportation home?:  Yes, mother will pick up Do you have the ability to pay for your medications: Yes,  patient will be provided with prescriptions at discharge  Release of information consent forms completed and in the chart;  Patient's signature needed at discharge.  Patient to Follow up at: Follow-up Information    [facility]   Specialty:  Professional Counselor   Why:  Walk-in clinic on Wednesday May 24th at 8am for assessment for therapy and medication management services.    Contact information:   [facility] [address] [place] [phone]       Next level of care provider has access to [place] Link: No  Safety Planning and Suicide Prevention discussed: Yes,  with patient and mother  Have you used any form of tobacco in the last 30 days? (Cigarettes, Smokeless Tobacco, Cigars, and/or Pipes): No  Has patient been referred to the Quitline?: Patient refused referral  Patient has been referred for addiction treatment: Yes  Delvis Kau, [place] CarboKristin L 12/11/2015, 11:04 AM

## 2015-12-11 NOTE — Progress Notes (Signed)
Leah [name] was D/C from the unit to lobby accompanied by family.  She was pleasant and cooperative. She voiced no SI/HI or A/V halluciations.  She denies any pain or discomfort.  D/C instructions and medications reviewed with pt.  Pt. verbalized understanding of medications and d/c instructions.   All belongings (locker 4) returned to pt. Q 15 min checks maintained until discharge.  She left the unit in no apparent distress.

## 2015-12-12 ENCOUNTER — Encounter (HOSPITAL_COMMUNITY): Payer: Self-pay | Admitting: *Deleted

## 2016-01-30 ENCOUNTER — Ambulatory Visit (HOSPITAL_COMMUNITY)
Admission: EM | Admit: 2016-01-30 | Discharge: 2016-01-30 | Disposition: A | Discharge status: Home / Self Care | Payer: Self-pay | Attending: Family Medicine | Admitting: Family Medicine

## 2016-01-30 ENCOUNTER — Encounter (HOSPITAL_COMMUNITY): Payer: Self-pay | Admitting: Emergency Medicine

## 2016-01-30 ENCOUNTER — Ambulatory Visit (INDEPENDENT_AMBULATORY_CARE_PROVIDER_SITE_OTHER): Payer: Self-pay

## 2016-01-30 DIAGNOSIS — S93602A Unspecified sprain of left foot, initial encounter: Secondary | ICD-10-CM

## 2016-01-30 DIAGNOSIS — S93402A Sprain of unspecified ligament of left ankle, initial encounter: Secondary | ICD-10-CM

## 2016-01-30 NOTE — ED Provider Notes (Signed)
CSN: [id number]     Arrival date & time 01/30/16  1740 History   First MD Initiated Contact with Patient 01/30/16 1847     Chief Complaint  Patient presents with  . Ankle Pain   (Consider location/radiation/quality/duration/timing/severity/associated sxs/prior Treatment) HPI History obtained from patient:  Pt presents with the cc of:  Left foot and ankle pain Duration of symptoms: Since yesterday Treatment prior to arrival: Cold compresses and elevation Context: [place]umped [place] twisting her left ankle and foot Other symptoms include: Painful to weight-bear Pain score: 5 or 6 FAMILY HISTORY: Arthritis    Past Medical History  Diagnosis Date  . Anxiety 2011    no meds during pregnancy  . 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 Gall stones   . Anemia 2013  . GERD (gastroesophageal reflux disease)   . Chlamydia    Past Surgical History  Procedure Laterality Date  . Wisdom tooth extraction  2013  . Direct laryngoscopy N/A 04/24/2014    Procedure: DIRECT LARYNGOSCOPY;  Surgeon: Darletta MollSui W Teoh, MD;  [facility];  Service: ENT;  Laterality: N/A;  . Foreign body removal esophageal N/A 04/24/2014    Procedure: REMOVAL FOREIGN BODY ESOPHAGEAL;  Surgeon: Darletta MollSui W Teoh, MD;  [facility];  Service: ENT;  Laterality: N/A;  . Cholecystectomy N/A 09/28/2014    Procedure: LAPAROSCOPIC CHOLECYSTECTOMY;  Surgeon: Axel FillerArmando Ramirez, MD;  Location: [facility] OR;  Service: General;  Laterality: N/A;  . Cholecystectomy     Family History  Problem Relation Age of Onset  . Arthritis Mother   . Bronchitis Mother   . Asthma Mother   . Hearing loss Paternal Grandfather   . Cancer Neg Hx   . Diabetes Neg Hx    Social History  Substance Use Topics  . Smoking status: Never Smoker   . Smokeless tobacco: None  . Alcohol Use: 3.6 oz/week    0 Standard drinks or equivalent, 6 Cans of beer per week   OB History    Gravida Para Term  Preterm AB TAB SAB Ectopic Multiple Living   1 1 1  0 0 0 0 0  1     Review of Systems  Denies: HEADACHE, NAUSEA, ABDOMINAL PAIN, CHEST PAIN, CONGESTION, DYSURIA, SHORTNESS OF BREATH  Allergies  Review of patient's allergies indicates no known allergies.  Home Medications   Prior to Admission medications   Medication Sig Start Date End Date Taking? Authorizing Provider  divalproex (DEPAKOTE) 250 MG DR tablet Take 250 mg by mouth 3 (three) times daily.   Yes Historical Provider, MD  indomethacin (INDOCIN) 25 MG capsule Take 25 mg by mouth 2 (two) times daily with a meal.   Yes Historical Provider, MD  mirtazapine (REMERON) 15 MG tablet Take 1 tablet (15 mg total) by mouth at bedtime. 12/11/15  Yes Adonis BrookSheila Agustin, NP  azithromycin (ZITHROMAX) 500 MG tablet Take 2 tablets (1,000 mg total) by mouth daily. Patient not taking: Reported on 05/16/2015 04/20/15   Santiago GladHeather Laisure, PA-C  benzonatate (TESSALON) 100 MG capsule Take 1 capsule (100 mg total) by mouth every 8 (eight) hours. Patient not taking: Reported on 07/03/2015 05/10/15   Renne CriglerJoshua Geiple, PA-C  diphenhydrAMINE (BENADRYL) 25 MG tablet Take 1 tablet (25 mg total) by mouth every 6 (six) hours. 10/29/15   Stevi Barrett, PA-C  doxycycline (VIBRAMYCIN) 100 MG capsule Take 1 capsule (100 mg total) by mouth 2 (  two) times daily. Patient not taking: Reported on 07/03/2015 05/16/15   Antony Madura, PA-C  hydrOXYzine (ATARAX/VISTARIL) 25 MG tablet Take 1 tablet (25 mg total) by mouth every 6 (six) hours as needed (anxiety/agitation or CIWA < or = 10). 12/11/15   Adonis Brook, NP  ibuprofen (ADVIL,MOTRIN) 800 MG tablet Take 1 tablet (800 mg total) by mouth 3 (three) times daily. 07/04/15   Shawn C Joy, PA-C  loratadine (CLARITIN) 10 MG tablet Take 1 tablet (10 mg total) by mouth daily. 10/29/15   Stevi Barrett, PA-C  metroNIDAZOLE (FLAGYL) 500 MG tablet Take 1 tablet (500 mg total) by mouth 2 (two) times daily. 07/04/15   Shawn C Joy, PA-C  naproxen  (NAPROSYN) 500 MG tablet Take 1 tablet (500 mg total) by mouth 2 (two) times daily. 05/10/15   Renne Crigler, PA-C  oxyCODONE-acetaminophen (PERCOCET/ROXICET) 5-325 MG tablet Take 2 tablets by mouth every 4 (four) hours as needed for severe pain. 07/04/15   Shawn C Joy, PA-C  phenazopyridine (PYRIDIUM) 200 MG tablet Take 1 tablet (200 mg total) by mouth 3 (three) times daily. 07/04/15   Shawn C Joy, PA-C  traMADol (ULTRAM) 50 MG tablet Take 1 tablet (50 mg total) by mouth every 6 (six) hours as needed for severe pain. Patient not taking: Reported on 07/03/2015 05/16/15   Antony Madura, PA-C   Meds Ordered and Administered this Visit  Medications - No data to display  BP 101/64 mmHg  Pulse 92  Temp(Src) 97.9 F (36.6 C) (Oral)  Resp 16  SpO2 100% No data found.   Physical Exam NURSES NOTES AND VITAL SIGNS REVIEWED. CONSTITUTIONAL: Well developed, well nourished, no acute distress HEENT: normocephalic, atraumatic EYES: Conjunctiva normal NECK:normal ROM, supple, no adenopathy PULMONARY:No respiratory distress, normal effort ABDOMINAL: Soft, ND, NT BS+, No CVAT MUSCULOSKELETAL: Normal ROM of all extremities, Left foot ankle, tender to palpation, sensory and Motor function are intact. No significant soft tissue swelling. SKIN: warm and dry without rash PSYCHIATRIC: Mood and affect, behavior are normal  ED Course  Procedures (including critical care time)  Labs Review Labs Reviewed - No data to display  Imaging Review Dg Ankle Complete Left  01/30/2016  CLINICAL DATA:  Pain following jumping injury EXAM: LEFT ANKLE COMPLETE - 3+ VIEW COMPARISON:  April 18, 2011 FINDINGS: Frontal, oblique, and lateral views were obtained. There is mild generalized soft tissue swelling. There is no demonstrable fracture or joint effusion. The ankle mortise appears intact. No appreciable joint space narrowing. IMPRESSION: Mild soft tissue swelling.  No fracture.  Mortise intact. Electronically Signed    By: Bretta Bang III M.D.   On: 01/30/2016 19:52   Dg Foot Complete Left  01/30/2016  CLINICAL DATA:  Pain following jumping injury EXAM: LEFT FOOT - COMPLETE 3+ VIEW COMPARISON:  September 02/08/2011 FINDINGS: Frontal, oblique, and lateral views were obtained. There is no fracture or dislocation. The joint spaces appear normal. No erosive change. IMPRESSION: There is demonstrable fracture or dislocation. No apparent arthropathy. Electronically Signed   By: Bretta Bang III M.D.   On: 01/30/2016 19:59    dISCUSSED WITH PATIENT Visual Acuity Review  Right Eye Distance:   Left Eye Distance:   Bilateral Distance:    Right Eye Near:   Left Eye Near:    Bilateral Near:       Ace wrap and crutches are provided. Return to work note is provided also.  MDM   1. Ankle sprain, left, initial encounter   2. Foot sprain,  left, initial encounter     Patient is reassured that there are no issues that require transfer to higher level of care at this time or additional tests. Patient is advised to continue home symptomatic treatment. Patient is advised that if there are new or worsening symptoms to attend the emergency department, contact primary care provider, or return to UC. Instructions of care provided discharged home in stable condition.    THIS NOTE WAS GENERATED USING A VOICE RECOGNITION SOFTWARE PROGRAM. ALL REASONABLE EFFORTS  WERE MADE TO PROOFREAD THIS DOCUMENT FOR ACCURACY.  I have verbally reviewed the discharge instructions with the patient. A printed AVS was given to the patient.  All questions were answered prior to discharge.      Tharon Aquas, PA 01/30/16 2110

## 2016-01-30 NOTE — Discharge Instructions (Signed)
Ankle Sprain  An ankle sprain is an injury to the strong, fibrous tissues (ligaments) that hold the bones of your ankle joint together.   CAUSES  An ankle sprain is usually caused by a fall or by twisting your ankle. Ankle sprains most commonly occur when you step on the outer edge of your foot, and your ankle turns inward. People who participate in sports are more prone to these types of injuries.   SYMPTOMS    Pain in your ankle. The pain may be present at rest or only when you are trying to stand or walk.   Swelling.   Bruising. Bruising may develop immediately or within 1 to 2 days after your injury.   Difficulty standing or walking, particularly when turning corners or changing directions.  DIAGNOSIS   Your caregiver will ask you details about your injury and perform a physical exam of your ankle to determine if you have an ankle sprain. During the physical exam, your caregiver will press on and apply pressure to specific areas of your foot and ankle. Your caregiver will try to move your ankle in certain ways. An X-ray exam may be done to be sure a bone was not broken or a ligament did not separate from one of the bones in your ankle (avulsion fracture).   TREATMENT   Certain types of braces can help stabilize your ankle. Your caregiver can make a recommendation for this. Your caregiver may recommend the use of medicine for pain. If your sprain is severe, your caregiver may refer you to a surgeon who helps to restore function to parts of your skeletal system (orthopedist) or a physical therapist.  HOME CARE INSTRUCTIONS    Apply ice to your injury for 1-2 days or as directed by your caregiver. Applying ice helps to reduce inflammation and pain.    Put ice in a plastic bag.    Place a towel between your skin and the bag.    Leave the ice on for 15-20 minutes at a time, every 2 hours while you are awake.   Only take over-the-counter or prescription medicines for pain, discomfort, or fever as directed by  your caregiver.   Elevate your injured ankle above the level of your heart as much as possible for 2-3 days.   If your caregiver recommends crutches, use them as instructed. Gradually put weight on the affected ankle. Continue to use crutches or a cane until you can walk without feeling pain in your ankle.   If you have a plaster splint, wear the splint as directed by your caregiver. Do not rest it on anything harder than a pillow for the first 24 hours. Do not put weight on it. Do not get it wet. You may take it off to take a shower or bath.   You may have been given an elastic bandage to wear around your ankle to provide support. If the elastic bandage is too tight (you have numbness or tingling in your foot or your foot becomes cold and blue), adjust the bandage to make it comfortable.   If you have an air splint, you may blow more air into it or let air out to make it more comfortable. You may take your splint off at night and before taking a shower or bath. Wiggle your toes in the splint several times per day to decrease swelling.  SEEK MEDICAL CARE IF:    You have rapidly increasing bruising or swelling.   Your toes feel   extremely cold or you lose feeling in your foot.   Your pain is not relieved with medicine.  SEEK IMMEDIATE MEDICAL CARE IF:   Your toes are numb or blue.   You have severe pain that is increasing.  MAKE SURE YOU:    Understand these instructions.   Will watch your condition.   Will get help right away if you are not doing well or get worse.     This information is not intended to replace advice given to you by your health care provider. Make sure you discuss any questions you have with your health care provider.     Document Released: 07/08/2005 Document Revised: 07/29/2014 Document Reviewed: 07/20/2011  Elsevier Interactive Patient Education 2016 [employer]

## 2016-01-30 NOTE — ED Notes (Signed)
[facility] with a complaint of left ankle pain secondary to landing wrong on it after jumping off of a hill yesterday.

## 2016-03-13 ENCOUNTER — Encounter (HOSPITAL_COMMUNITY): Payer: Self-pay | Admitting: Neurology

## 2016-03-13 ENCOUNTER — Emergency Department (HOSPITAL_COMMUNITY)
Admission: EM | Admit: 2016-03-13 | Discharge: 2016-03-13 | Disposition: A | Discharge status: Home / Self Care | Payer: Self-pay | Attending: Emergency Medicine | Admitting: Emergency Medicine

## 2016-03-13 DIAGNOSIS — N390 Urinary tract infection, site not specified: Secondary | ICD-10-CM

## 2016-03-13 DIAGNOSIS — N730 Acute parametritis and pelvic cellulitis: Secondary | ICD-10-CM

## 2016-03-13 LAB — URINALYSIS, ROUTINE W REFLEX MICROSCOPIC
BILIRUBIN URINE: NEGATIVE
GLUCOSE, UA: NEGATIVE mg/dL
KETONES UR: NEGATIVE mg/dL
Nitrite: NEGATIVE
PH: 8 (ref 5.0–8.0)
Protein, ur: 100 mg/dL — AB
SPECIFIC GRAVITY, URINE: 1.024 (ref 1.005–1.030)

## 2016-03-13 LAB — WET PREP, GENITAL
Clue Cells Wet Prep HPF POC: NONE SEEN
SPERM: NONE SEEN
Trich, Wet Prep: NONE SEEN
Yeast Wet Prep HPF POC: NONE SEEN

## 2016-03-13 LAB — URINE MICROSCOPIC-ADD ON

## 2016-03-13 LAB — POC URINE PREG, ED: PREG TEST UR: NEGATIVE

## 2016-03-13 MED ORDER — FOSFOMYCIN TROMETHAMINE 3 G PO PACK
3.0000 g | PACK | Freq: Once | ORAL | Status: AC
  Administered 2016-03-13: 3 g via ORAL
  Filled 2016-03-13: qty 3

## 2016-03-13 MED ORDER — IBUPROFEN 400 MG PO TABS
600.0000 mg | ORAL_TABLET | Freq: Once | ORAL | Status: AC
  Administered 2016-03-13: 600 mg via ORAL
  Filled 2016-03-13: qty 1

## 2016-03-13 MED ORDER — DOXYCYCLINE HYCLATE 100 MG PO TABS
100.0000 mg | ORAL_TABLET | Freq: Once | ORAL | Status: AC
  Administered 2016-03-13: 100 mg via ORAL
  Filled 2016-03-13: qty 1

## 2016-03-13 MED ORDER — ONDANSETRON 4 MG PO TBDP
4.0000 mg | ORAL_TABLET | Freq: Once | ORAL | Status: AC
  Administered 2016-03-13: 4 mg via ORAL

## 2016-03-13 MED ORDER — DOXYCYCLINE HYCLATE 100 MG PO CAPS: 100.0000 mg | ORAL_CAPSULE | Freq: Two times a day (BID) | ORAL | 0 refills | Status: AC

## 2016-03-13 MED ORDER — ONDANSETRON 4 MG PO TBDP
4.0000 mg | ORAL_TABLET | Freq: Once | ORAL | Status: DC
  Filled 2016-03-13: qty 1

## 2016-03-13 MED ORDER — ONDANSETRON HCL 4 MG PO TABS: 4.0000 mg | ORAL_TABLET | Freq: Three times a day (TID) | ORAL | 0 refills | Status: DC | PRN

## 2016-03-13 MED ORDER — METRONIDAZOLE 500 MG PO TABS: 500.0000 mg | ORAL_TABLET | Freq: Two times a day (BID) | ORAL | 0 refills | Status: AC

## 2016-03-13 MED ORDER — LIDOCAINE HCL (PF) 1 % IJ SOLN
2.0000 mL | Freq: Once | INTRAMUSCULAR | Status: AC
  Administered 2016-03-13: 2 mL
  Filled 2016-03-13: qty 5

## 2016-03-13 MED ORDER — IBUPROFEN 600 MG PO TABS: 600.0000 mg | ORAL_TABLET | Freq: Four times a day (QID) | ORAL | 0 refills | Status: DC | PRN

## 2016-03-13 MED ORDER — CEFTRIAXONE SODIUM 250 MG IJ SOLR
250.0000 mg | Freq: Once | INTRAMUSCULAR | Status: AC
  Administered 2016-03-13: 250 mg via INTRAMUSCULAR
  Filled 2016-03-13: qty 250

## 2016-03-13 NOTE — ED Triage Notes (Signed)
Pt reports pelvic pain x 1 week, with urinary frequency, urgency. Denies vaginal d/c. Reports was seen at health dept last week for STDs and was negative.

## 2016-03-13 NOTE — ED Provider Notes (Signed)
[facility]-EMERGENCY DEPT Provider Note   CSN: [id number] Arrival date & time: 03/13/16  1014     History   Chief Complaint Chief Complaint  Patient presents with  . Urinary Tract Infection    HPI Leah [name] is a 20 y.o. female.  The history is provided by the patient.  Vaginal Bleeding  Primary symptoms include discharge, pelvic pain, dysuria.  Primary symptoms include no vaginal bleeding. There has been no fever. This is a new problem. The current episode started more than 2 days ago. The problem occurs constantly. The problem has been gradually worsening. The symptoms occur during urination, during intercourse and spontaneously. She is not pregnant. She has not missed her period. The patient's menstrual history has been regular. The discharge was white. Associated symptoms include abdominal pain. Pertinent negatives include no abdominal swelling, no diarrhea, no nausea, no vomiting, no light-headedness and no dizziness. She has tried nothing for the symptoms. Sexual activity: sexually active. She uses nothing for contraception.    Past Medical History:  Diagnosis Date  . Anemia 2013  . Anxiety 2011   no meds during pregnancy  . Chlamydia   . Gall stones   . GERD (gastroesophageal reflux disease)   . 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 Seizure [facility](HCC) 2011   pseudoseizures    Patient Active Problem List   Diagnosis Date Noted  . Suicide attempt by drug ingestion (HCC)   . MDD (major depressive disorder), recurrent episode, moderate (HCC) 12/07/2015  . Contraception 03/10/2014  . Biliary colic 03/10/2014  . BV (bacterial vaginosis) 03/10/2014  . Depression 03/16/2013  . Allergic rhinitis, seasonal 01/26/2013  . Anemia, iron deficiency 12/01/2012    Past Surgical History:  Procedure Laterality Date  . CHOLECYSTECTOMY N/A 09/28/2014   Procedure: LAPAROSCOPIC CHOLECYSTECTOMY;  Surgeon: Axel FillerArmando Ramirez, MD;  Location: [facility] OR;  Service: General;   Laterality: N/A;  . CHOLECYSTECTOMY    . DIRECT LARYNGOSCOPY N/A 04/24/2014   Procedure: DIRECT LARYNGOSCOPY;  Surgeon: Darletta MollSui W Teoh, MD;  [facility];  Service: ENT;  Laterality: N/A;  . FOREIGN BODY REMOVAL ESOPHAGEAL N/A 04/24/2014   Procedure: REMOVAL FOREIGN BODY ESOPHAGEAL;  Surgeon: Darletta MollSui W Teoh, MD;  [facility];  Service: ENT;  Laterality: N/A;  . WISDOM TOOTH EXTRACTION  2013    OB History    Gravida Para Term Preterm AB Living   1 1 1  0 0 1   SAB TAB Ectopic Multiple Live Births   0 0 0   1       Home Medications    Prior to Admission medications   Medication Sig Start Date End Date Taking? Authorizing Provider  benzonatate (TESSALON) 100 MG capsule Take 1 capsule (100 mg total) by mouth every 8 (eight) hours. Patient not taking: Reported on 07/03/2015 05/10/15   Renne CriglerJoshua Geiple, PA-C  diphenhydrAMINE (BENADRYL) 25 MG tablet Take 1 tablet (25 mg total) by mouth every 6 (six) hours. 10/29/15   Stevi Barrett, PA-C  divalproex (DEPAKOTE) 250 MG DR tablet Take 250 mg by mouth 3 (three) times daily.    Historical Provider, MD  doxycycline (VIBRAMYCIN) 100 MG capsule Take 1 capsule (100 mg total) by mouth 2 (two) times daily. 03/13/16 03/27/16  Shaune Pollackameron Kolden Dupee, MD  hydrOXYzine (ATARAX/VISTARIL) 25 MG tablet Take 1 tablet (25 mg total) by mouth every 6 (six) hours as needed (anxiety/agitation or CIWA < or = 10). 12/11/15   Adonis BrookSheila Agustin, NP  ibuprofen (  ADVIL,MOTRIN) 600 MG tablet Take 1 tablet (600 mg total) by mouth every 6 (six) hours as needed for moderate pain. 03/13/16   Shaune Pollackameron Edilia Ghuman, MD  indomethacin (INDOCIN) 25 MG capsule Take 25 mg by mouth 2 (two) times daily with a meal.    Historical Provider, MD  loratadine (CLARITIN) 10 MG tablet Take 1 tablet (10 mg total) by mouth daily. 10/29/15   Stevi Barrett, PA-C  metroNIDAZOLE (FLAGYL) 500 MG tablet Take 1 tablet (500 mg total) by mouth 2 (two) times daily. 03/13/16 03/27/16  Shaune Pollackameron Canna Nickelson, MD  mirtazapine (REMERON) 15 MG tablet Take  1 tablet (15 mg total) by mouth at bedtime. 12/11/15   Adonis BrookSheila Agustin, NP  naproxen (NAPROSYN) 500 MG tablet Take 1 tablet (500 mg total) by mouth 2 (two) times daily. 05/10/15   Renne CriglerJoshua Geiple, PA-C  ondansetron (ZOFRAN) 4 MG tablet Take 1 tablet (4 mg total) by mouth every 8 (eight) hours as needed for nausea or vomiting. 03/13/16   Shaune Pollackameron Jandel Patriarca, MD  oxyCODONE-acetaminophen (PERCOCET/ROXICET) 5-325 MG tablet Take 2 tablets by mouth every 4 (four) hours as needed for severe pain. 07/04/15   Shawn C Joy, PA-C  phenazopyridine (PYRIDIUM) 200 MG tablet Take 1 tablet (200 mg total) by mouth 3 (three) times daily. 07/04/15   Anselm PancoastShawn C Joy, PA-C    Family History Family History  Problem Relation Age of Onset  . Arthritis Mother   . Bronchitis Mother   . Asthma Mother   . Hearing loss Paternal Grandfather   . Cancer Neg Hx   . Diabetes Neg Hx     Social History Social History  Substance Use Topics  . Smoking status: Never Smoker  . Smokeless tobacco: Not on file  . Alcohol use 3.6 oz/week    6 Cans of beer per week     Allergies   Review of patient's allergies indicates no known allergies.   Review of Systems Review of Systems  Constitutional: Negative for chills and fever.  HENT: Negative for congestion, rhinorrhea and sore throat.   Eyes: Negative for visual disturbance.  Respiratory: Negative for cough, shortness of breath and wheezing.   Cardiovascular: Negative for chest pain and leg swelling.  Gastrointestinal: Positive for abdominal pain. Negative for diarrhea, nausea and vomiting.  Genitourinary: Positive for dysuria and pelvic pain. Negative for flank pain, hematuria, vaginal bleeding and vaginal discharge.  Musculoskeletal: Negative for neck pain.  Skin: Negative for rash.  Allergic/Immunologic: Negative for immunocompromised state.  Neurological: Negative for dizziness, syncope, light-headedness and headaches.  Hematological: Does not bruise/bleed easily.  All other  systems reviewed and are negative.    Physical Exam Updated Vital Signs BP 106/64 (BP Location: Left Arm)   Pulse 80   Temp 98.7 F (37.1 C) (Oral)   Resp 16   Wt 100 lb (45.4 kg)   SpO2 100%   BMI 20.20 kg/m   Physical Exam  Constitutional: She is oriented to person, place, and time. She appears well-developed and well-nourished. No distress.  HENT:  Head: Normocephalic and atraumatic.  Eyes: Conjunctivae are normal.  Neck: Neck supple.  Cardiovascular: Normal rate, regular rhythm and normal heart sounds.  Exam reveals no friction rub.   No murmur heard. Pulmonary/Chest: Effort normal and breath sounds normal. No respiratory distress. She has no wheezes. She has no rales.  Abdominal: She exhibits no distension.  Genitourinary: Pelvic exam was performed with patient supine. There is no lesion on the right labia. There is no lesion on the left  labia. Uterus is tender. Uterus is not deviated, not enlarged and not fixed. Cervix exhibits motion tenderness and discharge. Cervix exhibits no friability. Right adnexum displays no mass and no tenderness. Left adnexum displays no mass and no tenderness. Vaginal discharge (Moderate, yellow-white) found.  Musculoskeletal: She exhibits no edema.  Neurological: She is alert and oriented to person, place, and time. She exhibits normal muscle tone.  Skin: Skin is warm. Capillary refill takes less than 2 seconds.  Psychiatric: She has a normal mood and affect.  Nursing note and vitals reviewed.    ED Treatments / Results  Labs (all labs ordered are listed, but only abnormal results are displayed) Labs Reviewed  WET PREP, GENITAL - Abnormal; Notable for the following:       Result Value   WBC, Wet Prep HPF POC MODERATE (*)    All other components within normal limits  URINALYSIS, ROUTINE W REFLEX MICROSCOPIC (NOT AT [facility]) - Abnormal; Notable for the following:    APPearance CLOUDY (*)    Hgb urine dipstick LARGE (*)    Protein, ur 100 (*)     Leukocytes, UA MODERATE (*)    All other components within normal limits  URINE MICROSCOPIC-ADD ON - Abnormal; Notable for the following:    Squamous Epithelial / LPF 0-5 (*)    Bacteria, UA RARE (*)    All other components within normal limits  URINE CULTURE  POC URINE PREG, ED  GC/CHLAMYDIA PROBE AMP ([place]) NOT AT [facility]    EKG  EKG Interpretation None       Radiology No results found.  Procedures Procedures (including critical care time)  Medications Ordered in ED Medications  cefTRIAXone (ROCEPHIN) injection 250 mg (250 mg Intramuscular Given 03/13/16 1306)  ibuprofen (ADVIL,MOTRIN) tablet 600 mg (600 mg Oral Given 03/13/16 1303)  doxycycline (VIBRA-TABS) tablet 100 mg (100 mg Oral Given 03/13/16 1303)  fosfomycin (MONUROL) packet 3 g (3 g Oral Given 03/13/16 1304)  lidocaine (PF) (XYLOCAINE) 1 % injection 2 mL (2 mLs Other Given 03/13/16 1305)  ondansetron (ZOFRAN-ODT) disintegrating tablet 4 mg (4 mg Oral Given 03/13/16 1349)     Initial Impression / Assessment and Plan / ED Course  I have reviewed the triage vital signs and the nursing notes.  Pertinent labs & imaging results that were available during my care of the patient were reviewed by me and considered in my medical decision making (see chart for details).  Clinical Course    20 year old female who presents with a one-week history of pelvic pain and urinary frequency and dysuria. No fevers or chills. Vital signs are stable and within normal limits. Examination is as above, concerning for cervical motion tenderness and vaginal discharge. Urinalysis concerning for possible UTI. Wet prep is positive for WBCs. Urine pregnancy is negative. I suspect the patient's symptoms are secondary to likely PID given cervical motion tenderness and discharge on exam. She may also have a concomitant UTI. As mentioned, she has no adnexal tenderness, fullness, or signs of TOA or ovarian pathology such as torsion. She is  afebrile, without signs of systemic illness. She is tolerating by mouth. Will treat empirically with Rocephin, doxycycline, and add Flagyl, given duration of symptoms. Return precautions given. Safe sex precautions given as well.  Final Clinical Impressions(s) / ED Diagnoses   Final diagnoses:  UTI (lower urinary tract infection)  PID (acute pelvic inflammatory disease)      Shaune Pollack, MD 03/13/16 1840

## 2016-03-14 LAB — GC/CHLAMYDIA PROBE AMP (~~LOC~~) NOT AT ARMC
CHLAMYDIA, DNA PROBE: POSITIVE — AB
NEISSERIA GONORRHEA: NEGATIVE

## 2016-03-14 LAB — URINE CULTURE

## 2016-03-15 ENCOUNTER — Telehealth: Payer: Self-pay | Admitting: Emergency Medicine

## 2016-03-15 ENCOUNTER — Telehealth (HOSPITAL_BASED_OUTPATIENT_CLINIC_OR_DEPARTMENT_OTHER): Payer: Self-pay | Admitting: Emergency Medicine

## 2016-03-27 ENCOUNTER — Encounter (HOSPITAL_COMMUNITY): Payer: Self-pay | Admitting: *Deleted

## 2016-03-27 ENCOUNTER — Emergency Department (HOSPITAL_COMMUNITY)
Admission: EM | Admit: 2016-03-27 | Discharge: 2016-03-27 | Disposition: A | Discharge status: Home / Self Care | Payer: Medicaid Other | Attending: Emergency Medicine | Admitting: Emergency Medicine

## 2016-03-27 ENCOUNTER — Emergency Department (HOSPITAL_COMMUNITY): Payer: Medicaid Other

## 2016-03-27 DIAGNOSIS — Z791 Long term (current) use of non-steroidal anti-inflammatories (NSAID): Secondary | ICD-10-CM | POA: Insufficient documentation

## 2016-03-27 DIAGNOSIS — R1032 Left lower quadrant pain: Secondary | ICD-10-CM | POA: Diagnosis not present

## 2016-03-27 DIAGNOSIS — Z79899 Other long term (current) drug therapy: Secondary | ICD-10-CM | POA: Insufficient documentation

## 2016-03-27 LAB — URINALYSIS, ROUTINE W REFLEX MICROSCOPIC
Bilirubin Urine: NEGATIVE
Glucose, UA: NEGATIVE mg/dL
Hgb urine dipstick: NEGATIVE
KETONES UR: NEGATIVE mg/dL
NITRITE: NEGATIVE
PH: 7 (ref 5.0–8.0)
Protein, ur: NEGATIVE mg/dL
SPECIFIC GRAVITY, URINE: 1.03 (ref 1.005–1.030)

## 2016-03-27 LAB — COMPREHENSIVE METABOLIC PANEL
ALBUMIN: 4.2 g/dL (ref 3.5–5.0)
ALT: 18 U/L (ref 14–54)
ANION GAP: 7 (ref 5–15)
AST: 18 U/L (ref 15–41)
Alkaline Phosphatase: 60 U/L (ref 38–126)
BILIRUBIN TOTAL: 1 mg/dL (ref 0.3–1.2)
BUN: 19 mg/dL (ref 6–20)
CO2: 25 mmol/L (ref 22–32)
Calcium: 9.1 mg/dL (ref 8.9–10.3)
Chloride: 107 mmol/L (ref 101–111)
Creatinine, Ser: 0.68 mg/dL (ref 0.44–1.00)
GFR calc Af Amer: >60 mL/min (ref >60)
Glucose, Bld: 105 mg/dL — ABNORMAL HIGH (ref 65–99)
POTASSIUM: 3.9 mmol/L (ref 3.5–5.1)
Sodium: 139 mmol/L (ref 135–145)
TOTAL PROTEIN: 7.6 g/dL (ref 6.5–8.1)

## 2016-03-27 LAB — URINE MICROSCOPIC-ADD ON

## 2016-03-27 LAB — CBC
HEMATOCRIT: 35.9 % — AB (ref 36.0–46.0)
HEMOGLOBIN: 12.2 g/dL (ref 12.0–15.0)
MCH: 30.7 pg (ref 26.0–34.0)
MCHC: 34 g/dL (ref 30.0–36.0)
MCV: 90.2 fL (ref 78.0–100.0)
Platelets: 239 10*3/uL (ref 150–400)
RBC: 3.98 MIL/uL (ref 3.87–5.11)
RDW: 12.8 % (ref 11.5–15.5)
WBC: 6 10*3/uL (ref 4.0–10.5)

## 2016-03-27 LAB — RAPID URINE DRUG SCREEN, HOSP PERFORMED
Amphetamines: NOT DETECTED
BARBITURATES: NOT DETECTED
BENZODIAZEPINES: NOT DETECTED
COCAINE: NOT DETECTED
Opiates: NOT DETECTED
Tetrahydrocannabinol: NOT DETECTED

## 2016-03-27 LAB — POC URINE PREG, ED: Preg Test, Ur: NEGATIVE

## 2016-03-27 LAB — WET PREP, GENITAL
Clue Cells Wet Prep HPF POC: NONE SEEN
Sperm: NONE SEEN
Trich, Wet Prep: NONE SEEN
Yeast Wet Prep HPF POC: NONE SEEN

## 2016-03-27 LAB — LIPASE, BLOOD: Lipase: 23 U/L (ref 11–51)

## 2016-03-27 MED ORDER — ONDANSETRON 4 MG PO TBDP
4.0000 mg | ORAL_TABLET | Freq: Once | ORAL | Status: AC | PRN
  Administered 2016-03-27: 4 mg via ORAL
  Filled 2016-03-27: qty 1

## 2016-03-27 MED ORDER — IOPAMIDOL (ISOVUE-300) INJECTION 61%
100.0000 mL | Freq: Once | INTRAVENOUS | Status: AC | PRN
  Administered 2016-03-27: 80 mL via INTRAVENOUS

## 2016-03-27 MED ORDER — ONDANSETRON HCL 4 MG/2ML IJ SOLN
4.0000 mg | Freq: Once | INTRAMUSCULAR | Status: AC
  Administered 2016-03-27: 4 mg via INTRAVENOUS
  Filled 2016-03-27: qty 2

## 2016-03-27 MED ORDER — SODIUM CHLORIDE 0.9 % IV BOLUS (SEPSIS)
1000.0000 mL | Freq: Once | INTRAVENOUS | Status: AC
  Administered 2016-03-27: 1000 mL via INTRAVENOUS

## 2016-03-27 MED ORDER — KETOROLAC TROMETHAMINE 30 MG/ML IJ SOLN
30.0000 mg | Freq: Once | INTRAMUSCULAR | Status: AC
  Administered 2016-03-27: 30 mg via INTRAVENOUS
  Filled 2016-03-27: qty 1

## 2016-03-27 MED ORDER — FENTANYL CITRATE (PF) 100 MCG/2ML IJ SOLN
50.0000 ug | Freq: Once | INTRAMUSCULAR | Status: AC
  Administered 2016-03-27: 50 ug via INTRAVENOUS
  Filled 2016-03-27: qty 2

## 2016-03-27 MED ORDER — TRAMADOL HCL 50 MG PO TABS: 50.0000 mg | ORAL_TABLET | Freq: Four times a day (QID) | ORAL | 0 refills | Status: DC | PRN

## 2016-03-27 NOTE — ED Triage Notes (Addendum)
Per EMS, pt complain of LLQ abdominal pain for the past 2 weeks, vomiting since yesterday. Pt was diagnosed with PID 2 weeks ago.

## 2016-03-27 NOTE — ED Provider Notes (Signed)
[facility]-EMERGENCY DEPT Provider Note   CSN: [id number] Arrival date & time: 03/27/16  1427     History   Chief Complaint Chief Complaint  Patient presents with  . Abdominal Pain   HPI   Leah [name] is an 20 y.o. female seen in the ED two weeks ago for evaluation of abdominal pain, found to have PID. She returns to the ED today reporting persisting lower abdominal pain particularly on the left side with nausea and vomiting since yesterday. She states today is her last dose of the antibiotics but she is still having abdominal pain. No fever or chills. Denies urinary symptoms. Denies vaginal discharge.   Past Medical History:  Diagnosis Date  . Anemia 2013  . Anxiety 2011   no meds during pregnancy  . Chlamydia   . Gall stones   . GERD (gastroesophageal reflux disease)   . 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 Seizure [facility](HCC) 2011   pseudoseizures    Patient Active Problem List   Diagnosis Date Noted  . Suicide attempt by drug ingestion (HCC)   . MDD (major depressive disorder), recurrent episode, moderate (HCC) 12/07/2015  . Contraception 03/10/2014  . Biliary colic 03/10/2014  . BV (bacterial vaginosis) 03/10/2014  . Depression 03/16/2013  . Allergic rhinitis, seasonal 01/26/2013  . Anemia, iron deficiency 12/01/2012    Past Surgical History:  Procedure Laterality Date  . CHOLECYSTECTOMY N/A 09/28/2014   Procedure: LAPAROSCOPIC CHOLECYSTECTOMY;  Surgeon: Axel FillerArmando Ramirez, MD;  Location: [facility] OR;  Service: General;  Laterality: N/A;  . CHOLECYSTECTOMY    . DIRECT LARYNGOSCOPY N/A 04/24/2014   Procedure: DIRECT LARYNGOSCOPY;  Surgeon: Darletta MollSui W Teoh, MD;  [facility];  Service: ENT;  Laterality: N/A;  . FOREIGN BODY REMOVAL ESOPHAGEAL N/A 04/24/2014   Procedure: REMOVAL FOREIGN BODY ESOPHAGEAL;  Surgeon: Darletta MollSui W Teoh, MD;  [facility];  Service: ENT;  Laterality: N/A;  . WISDOM TOOTH EXTRACTION  2013    OB History    Gravida Para Term Preterm AB  Living   1 1 1  0 0 1   SAB TAB Ectopic Multiple Live Births   0 0 0   1       Home Medications    Prior to Admission medications   Medication Sig Start Date End Date Taking? Authorizing Provider  benzonatate (TESSALON) 100 MG capsule Take 1 capsule (100 mg total) by mouth every 8 (eight) hours. Patient not taking: Reported on 07/03/2015 05/10/15   Renne CriglerJoshua Geiple, PA-C  diphenhydrAMINE (BENADRYL) 25 MG tablet Take 1 tablet (25 mg total) by mouth every 6 (six) hours. 10/29/15   Stevi Barrett, PA-C  divalproex (DEPAKOTE) 250 MG DR tablet Take 250 mg by mouth 3 (three) times daily.    Historical Provider, MD  doxycycline (VIBRAMYCIN) 100 MG capsule Take 1 capsule (100 mg total) by mouth 2 (two) times daily. 03/13/16 03/27/16  Shaune Pollackameron Isaacs, MD  hydrOXYzine (ATARAX/VISTARIL) 25 MG tablet Take 1 tablet (25 mg total) by mouth every 6 (six) hours as needed (anxiety/agitation or CIWA < or = 10). 12/11/15   Adonis BrookSheila Agustin, NP  ibuprofen (ADVIL,MOTRIN) 600 MG tablet Take 1 tablet (600 mg total) by mouth every 6 (six) hours as needed for moderate pain. 03/13/16   Shaune Pollackameron Isaacs, MD  indomethacin (INDOCIN) 25 MG capsule Take 25 mg by mouth 2 (two) times daily with a meal.    Historical Provider, MD  loratadine (CLARITIN) 10 MG tablet Take 1  tablet (10 mg total) by mouth daily. 10/29/15   Stevi Barrett, PA-C  metroNIDAZOLE (FLAGYL) 500 MG tablet Take 1 tablet (500 mg total) by mouth 2 (two) times daily. 03/13/16 03/27/16  Shaune Pollack, MD  mirtazapine (REMERON) 15 MG tablet Take 1 tablet (15 mg total) by mouth at bedtime. 12/11/15   Adonis Brook, NP  naproxen (NAPROSYN) 500 MG tablet Take 1 tablet (500 mg total) by mouth 2 (two) times daily. 05/10/15   Renne Crigler, PA-C  ondansetron (ZOFRAN) 4 MG tablet Take 1 tablet (4 mg total) by mouth every 8 (eight) hours as needed for nausea or vomiting. 03/13/16   Shaune Pollack, MD  oxyCODONE-acetaminophen (PERCOCET/ROXICET) 5-325 MG tablet Take 2 tablets by mouth every 4  (four) hours as needed for severe pain. 07/04/15   Shawn C Joy, PA-C  phenazopyridine (PYRIDIUM) 200 MG tablet Take 1 tablet (200 mg total) by mouth 3 (three) times daily. 07/04/15   Anselm Pancoast, PA-C    Family History Family History  Problem Relation Age of Onset  . Arthritis Mother   . Bronchitis Mother   . Asthma Mother   . Hearing loss Paternal Grandfather   . Cancer Neg Hx   . Diabetes Neg Hx     Social History Social History  Substance Use Topics  . Smoking status: Never Smoker  . Smokeless tobacco: Never Used  . Alcohol use 3.6 oz/week    6 Cans of beer per week     Allergies   Review of patient's allergies indicates no known allergies.   Review of Systems Review of Systems 10 Systems reviewed and are negative for acute change except as noted in the HPI.  Physical Exam Updated Vital Signs BP (!) 112/53 (BP Location: Right Arm)   Pulse 69   Temp 98.8 F (37.1 C) (Oral)   Resp 18   SpO2 100%   Physical Exam  Constitutional: She is oriented to person, place, and time.  HENT:  Right Ear: External ear normal.  Left Ear: External ear normal.  Nose: Nose normal.  Mouth/Throat: Oropharynx is clear and moist. No oropharyngeal exudate.  Eyes: Conjunctivae are normal.  Neck: Neck supple.  Cardiovascular: Normal rate, regular rhythm, normal heart sounds and intact distal pulses.   Pulmonary/Chest: Effort normal and breath sounds normal. No respiratory distress. She has no wheezes.  Abdominal: Soft. Bowel sounds are normal. She exhibits no distension. There is tenderness. There is no rebound and no guarding.  Mild LLQ tenderness  Genitourinary:  Genitourinary Comments: Vault with thin yellow-white discharge. Cervix non friable. No CMT or adnexal tenderness. No external lesions.  Musculoskeletal: She exhibits no edema.  Lymphadenopathy:    She has no cervical adenopathy.  Neurological: She is alert and oriented to person, place, and time. No cranial nerve deficit.   Skin: Skin is warm and dry.  Psychiatric: She has a normal mood and affect.  Nursing note and vitals reviewed.    ED Treatments / Results  Labs (all labs ordered are listed, but only abnormal results are displayed) Labs Reviewed  WET PREP, GENITAL - Abnormal; Notable for the following:       Result Value   WBC, Wet Prep HPF POC MODERATE (*)    All other components within normal limits  COMPREHENSIVE METABOLIC PANEL - Abnormal; Notable for the following:    Glucose, Bld 105 (*)    All other components within normal limits  CBC - Abnormal; Notable for the following:    HCT 35.9 (*)  All other components within normal limits  URINALYSIS, ROUTINE W REFLEX MICROSCOPIC (NOT AT [facility]) - Abnormal; Notable for the following:    Leukocytes, UA TRACE (*)    All other components within normal limits  URINE MICROSCOPIC-ADD ON - Abnormal; Notable for the following:    Squamous Epithelial / LPF 6-30 (*)    Bacteria, UA RARE (*)    All other components within normal limits  LIPASE, BLOOD  URINE RAPID DRUG SCREEN, HOSP PERFORMED  POC URINE PREG, ED  GC/CHLAMYDIA PROBE AMP ([place]) NOT AT [facility]    EKG  EKG Interpretation None       Radiology Ct Abdomen Pelvis W Contrast  Result Date: 03/27/2016 CLINICAL DATA:  20 year old female with left lower quadrant abdominal pain and vomiting. Recent diagnosis of PID. EXAM: CT ABDOMEN AND PELVIS WITH CONTRAST TECHNIQUE: Multidetector CT imaging of the abdomen and pelvis was performed using the standard protocol following bolus administration of intravenous contrast. CONTRAST:  80mL ISOVUE-300 IOPAMIDOL (ISOVUE-300) INJECTION 61% COMPARISON:  Pelvic ultrasound dated 07/04/2015 obtained CT dated 07/03/2015 FINDINGS: The visualized lung bases are clear. No intra-abdominal free air. Trace free fluid within the posterior pelvis. Focal ill-defined hypodensity in the left lobe of the liver along the falciform ligament likely representing a focal fatty  infiltration. The liver is otherwise unremarkable. Cholecystectomy. The pancreas, spleen, adrenal glands, kidneys, visualized ureters appear unremarkable. The urinary bladder is collapsed. There is apparent diffuse thickening of the bladder wall which may be partly related to underdistention or secondary to pelvic inflammatory process. Cystitis is not excluded. Correlation with urinalysis recommended. The uterus is anteverted. An intrauterine device is noted. Bilateral ovarian follicles noted. There is diffuse haziness of the pelvic floor fat with induration of therectouterine fat planes, likely sequela of recent pelvic inflammatory disease. No drainable fluid collection or abscess identified. Evaluation of the bowel is limited in the absence of oral contrast. There is no evidence of bowel obstruction or active inflammation. Normal appendix. The abdominal aorta and IVC appear unremarkable. The origins of the celiac axis, SMA, IMA as well as the origins of the renal arteries are patent. No portal venous gas identified. There is no adenopathy. The abdominal wall soft tissues appear unremarkable. The osseous structures are intact. IMPRESSION: Trace free fluid within pelvis with induration of the pelvic floor fat likely related to recent pelvic inflammatory disease. No drainable fluid collection/ abscess identified. Thickened appearance of the bladder wall likely related to nondistention and reactive to inflammatory changes of the pelvis. Correlation with urinalysis recommended to evaluate for cystitis. No bowel obstruction.  Normal appendix. Electronically Signed   By: Elgie Collard M.D.   On: 03/27/2016 21:09    Procedures Procedures (including critical care time)  Medications Ordered in ED Medications  ondansetron (ZOFRAN-ODT) disintegrating tablet 4 mg (4 mg Oral Given 03/27/16 1704)  sodium chloride 0.9 % bolus 1,000 mL (0 mLs Intravenous Stopped 03/27/16 2204)  ondansetron (ZOFRAN) injection 4 mg (4 mg  Intravenous Given 03/27/16 1911)  ketorolac (TORADOL) 30 MG/ML injection 30 mg (30 mg Intravenous Given 03/27/16 1911)  fentaNYL (SUBLIMAZE) injection 50 mcg (50 mcg Intravenous Given 03/27/16 1959)  iopamidol (ISOVUE-300) 61 % injection 100 mL (80 mLs Intravenous Contrast Given 03/27/16 2043)     Initial Impression / Assessment and Plan / ED Course  I have reviewed the triage vital signs and the nursing notes.  Pertinent labs & imaging results that were available during my care of the patient were reviewed by me and considered  in my medical decision making (see chart for details).  Clinical Course    Labs unremarkable. Given persistent pain and tenderness CT abd/pelvis was ordered and reveals some trace free fluid and pelvic floor induration likely related to recent PID. Suspect pt's pain from residual inflammation/induration. Rx given for tramadol. Pt has nausea medicine at home. Her pain is improved and she is tolerating PO. Referral to wellness given to establish PCP for follow up. ER return precautions given. The patient appears reasonably screened and/or stabilized for discharge and I doubt any other medical condition or other [facility] requiring further screening, evaluation, or treatment in the ED at this time prior to discharge.   Final Clinical Impressions(s) / ED Diagnoses   Final diagnoses:  Left lower quadrant pain    New Prescriptions Discharge Medication List as of 03/27/2016 10:16 PM    START taking these medications   Details  traMADol (ULTRAM) 50 MG tablet Take 1 tablet (50 mg total) by mouth every 6 (six) hours as needed., Starting Wed 03/27/2016, Print         Carlene Coria, PA-C 03/28/16 0031    Benjiman Core, MD 03/29/16 [phone]

## 2016-03-27 NOTE — Progress Notes (Signed)
Patient noted to have had 5 ED visits within the last six months.  EDCM spoke to patient at bedside. Patient confirms she [place] a pcp [place]  [facility], informed patient of services there and walk in times.  EDCM also provided patient with list of pcps who accept self pay patients, list of discount pharmacies and websites needymeds.org and GoodRX.com for medication assistance, phone number to inquire about the orange card, phone number to inquire about Medicaid, phone number to inquire about the Affordable Care Act, financial resources in the community such as local churches, [employer], urban ministries, and dental assistance for uninsured patients.  Patient thankful for resources.  No further EDCM needs at this time.  [facility]  She reports she has Medicaid Family Planning.  She is aware this insurance does not assist in the cost of medications.

## 2016-03-28 LAB — GC/CHLAMYDIA PROBE AMP (~~LOC~~) NOT AT ARMC
CHLAMYDIA, DNA PROBE: NEGATIVE
Neisseria Gonorrhea: NEGATIVE

## 2016-05-14 ENCOUNTER — Telehealth (HOSPITAL_BASED_OUTPATIENT_CLINIC_OR_DEPARTMENT_OTHER): Payer: Self-pay | Admitting: Emergency Medicine

## 2016-05-14 NOTE — Telephone Encounter (Signed)
Lost to followup 

## 2016-08-02 ENCOUNTER — Emergency Department (HOSPITAL_COMMUNITY)
Admission: EM | Admit: 2016-08-02 | Discharge: 2016-08-02 | Disposition: A | Discharge status: Home / Self Care | Payer: Medicaid Other | Attending: Emergency Medicine | Admitting: Emergency Medicine

## 2016-08-02 ENCOUNTER — Encounter (HOSPITAL_COMMUNITY): Payer: Self-pay | Admitting: *Deleted

## 2016-08-02 ENCOUNTER — Emergency Department (HOSPITAL_COMMUNITY): Payer: Medicaid Other

## 2016-08-02 DIAGNOSIS — R197 Diarrhea, unspecified: Secondary | ICD-10-CM | POA: Diagnosis not present

## 2016-08-02 DIAGNOSIS — R112 Nausea with vomiting, unspecified: Secondary | ICD-10-CM

## 2016-08-02 DIAGNOSIS — R109 Unspecified abdominal pain: Secondary | ICD-10-CM | POA: Insufficient documentation

## 2016-08-02 DIAGNOSIS — R05 Cough: Secondary | ICD-10-CM | POA: Insufficient documentation

## 2016-08-02 LAB — COMPREHENSIVE METABOLIC PANEL
ALBUMIN: 4.1 g/dL (ref 3.5–5.0)
ALT: 19 U/L (ref 14–54)
AST: 24 U/L (ref 15–41)
Alkaline Phosphatase: 61 U/L (ref 38–126)
Anion gap: 9 (ref 5–15)
BUN: 15 mg/dL (ref 6–20)
CHLORIDE: 106 mmol/L (ref 101–111)
CO2: 25 mmol/L (ref 22–32)
CREATININE: 0.93 mg/dL (ref 0.44–1.00)
Calcium: 9.5 mg/dL (ref 8.9–10.3)
GFR calc Af Amer: >60 mL/min (ref >60)
GFR calc non Af Amer: >60 mL/min (ref >60)
Glucose, Bld: 145 mg/dL — ABNORMAL HIGH (ref 65–99)
POTASSIUM: 3.6 mmol/L (ref 3.5–5.1)
SODIUM: 140 mmol/L (ref 135–145)
Total Bilirubin: 1.6 mg/dL — ABNORMAL HIGH (ref 0.3–1.2)
Total Protein: 7.5 g/dL (ref 6.5–8.1)

## 2016-08-02 LAB — CBC
HEMATOCRIT: 40.7 % (ref 36.0–46.0)
Hemoglobin: 13.8 g/dL (ref 12.0–15.0)
MCH: 30.2 pg (ref 26.0–34.0)
MCHC: 33.9 g/dL (ref 30.0–36.0)
MCV: 89.1 fL (ref 78.0–100.0)
PLATELETS: 234 10*3/uL (ref 150–400)
RBC: 4.57 MIL/uL (ref 3.87–5.11)
RDW: 12.3 % (ref 11.5–15.5)
WBC: 5.5 10*3/uL (ref 4.0–10.5)

## 2016-08-02 LAB — URINALYSIS, ROUTINE W REFLEX MICROSCOPIC
Bilirubin Urine: NEGATIVE
Glucose, UA: NEGATIVE mg/dL
HGB URINE DIPSTICK: NEGATIVE
KETONES UR: NEGATIVE mg/dL
LEUKOCYTES UA: NEGATIVE
Nitrite: NEGATIVE
PH: 6 (ref 5.0–8.0)
PROTEIN: NEGATIVE mg/dL
Specific Gravity, Urine: 1.025 (ref 1.005–1.030)

## 2016-08-02 LAB — LIPASE, BLOOD: LIPASE: 23 U/L (ref 11–51)

## 2016-08-02 LAB — I-STAT BETA HCG BLOOD, ED (MC, WL, AP ONLY): I-stat hCG, quantitative: <5 m[IU]/mL (ref <5)

## 2016-08-02 MED ORDER — ONDANSETRON HCL 8 MG PO TABS: 8.0000 mg | ORAL_TABLET | Freq: Three times a day (TID) | ORAL | 0 refills | Status: DC | PRN

## 2016-08-02 MED ORDER — ACETAMINOPHEN 325 MG PO TABS
650.0000 mg | ORAL_TABLET | Freq: Once | ORAL | Status: AC
  Administered 2016-08-02: 650 mg via ORAL
  Filled 2016-08-02: qty 2

## 2016-08-02 MED ORDER — ONDANSETRON 4 MG PO TBDP
8.0000 mg | ORAL_TABLET | Freq: Once | ORAL | Status: AC
  Administered 2016-08-02: 8 mg via ORAL
  Filled 2016-08-02: qty 2

## 2016-08-02 NOTE — ED Notes (Signed)
E signature not available, no questions or concerns with dc

## 2016-08-02 NOTE — ED Notes (Signed)
Pt st's she is hungry and is requesting something to eat.

## 2016-08-02 NOTE — ED Provider Notes (Signed)
[facility]-EMERGENCY DEPT Provider Note   CSN: [id number] Arrival date & time: 08/02/16  1114     History   Chief Complaint Chief Complaint  Patient presents with  . Abdominal Pain    HPI Leah [name] is a 21 y.o. female.  HPI Complains of intermittent crampy abdominal pain onset 12 days ago accompanied by vomiting diarrhea. She vomited one time today has had greater than 5 episodes of diarrhea. No blood per rectum no hematemesis. No fever. She is treated herself with Pepto-Bismol and with NyQuil, without relief. Other associated symptoms include mild cough patient states "I think I have the stomach flu." Nothing makes symptoms better or worse. No other associated symptoms Past Medical History:  Diagnosis Date  . Anemia 2013  . Anxiety 2011   no meds during pregnancy  . Chlamydia   . Gall stones   . GERD (gastroesophageal reflux disease)   . 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 Seizure [facility]) 2011   pseudoseizures    Patient Active Problem List   Diagnosis Date Noted  . Suicide attempt by drug ingestion (HCC)   . MDD (major depressive disorder), recurrent episode, moderate (HCC) 12/07/2015  . Contraception 03/10/2014  . Biliary colic 03/10/2014  . BV (bacterial vaginosis) 03/10/2014  . Depression 03/16/2013  . Allergic rhinitis, seasonal 01/26/2013  . Anemia, iron deficiency 12/01/2012    Past Surgical History:  Procedure Laterality Date  . CHOLECYSTECTOMY N/A 09/28/2014   Procedure: LAPAROSCOPIC CHOLECYSTECTOMY;  Surgeon: Axel Filler, MD;  Location: [facility] OR;  Service: General;  Laterality: N/A;  . CHOLECYSTECTOMY    . DIRECT LARYNGOSCOPY N/A 04/24/2014   Procedure: DIRECT LARYNGOSCOPY;  Surgeon: [name], MD;  Location: [facility] OR;  Service: ENT;  Laterality: N/A;  . FOREIGN BODY REMOVAL ESOPHAGEAL N/A 04/24/2014   Procedure: REMOVAL FOREIGN BODY ESOPHAGEAL;  Surgeon: [name], MD;  Location: [facility] OR;  Service: ENT;  Laterality: N/A;  . WISDOM TOOTH  EXTRACTION  2013    OB History    Gravida Para Term Preterm AB Living   1 1 1  0 0 1   SAB TAB Ectopic Multiple Live Births   0 0 0   1       Home Medications    Prior to Admission medications   Medication Sig Start Date End Date Taking? Authorizing Provider  divalproex (DEPAKOTE) 250 MG DR tablet Take 250 mg by mouth 2 (two) times daily.     Historical Provider, MD  hydrOXYzine (ATARAX/VISTARIL) 25 MG tablet Take 1 tablet (25 mg total) by mouth every 6 (six) hours as needed (anxiety/agitation or CIWA < or = 10). Patient not taking: Reported on 03/27/2016 12/11/15   Adonis Brook, NP  ibuprofen (ADVIL,MOTRIN) 600 MG tablet Take 1 tablet (600 mg total) by mouth every 6 (six) hours as needed for moderate pain. Patient not taking: Reported on 03/27/2016 03/13/16   Shaune Pollack, MD  indomethacin (INDOCIN) 25 MG capsule Take 25 mg by mouth 3 (three) times daily as needed for mild pain or moderate pain.     Historical Provider, MD  levonorgestrel (MIRENA) 20 MCG/24HR IUD 1 each by Intrauterine route once. Placed in 09/2015    Historical Provider, MD  loratadine (CLARITIN) 10 MG tablet Take 1 tablet (10 mg total) by mouth daily. Patient not taking: Reported on 03/27/2016 10/29/15   Rolm Gala Barrett, PA-C  mirtazapine (REMERON) 15 MG tablet Take 1 tablet (15 mg total) by mouth at  bedtime. 12/11/15   Adonis BrookSheila Agustin, NP  ondansetron (ZOFRAN) 4 MG tablet Take 1 tablet (4 mg total) by mouth every 8 (eight) hours as needed for nausea or vomiting. Patient not taking: Reported on 03/27/2016 03/13/16   Shaune Pollackameron Isaacs, MD  traMADol (ULTRAM) 50 MG tablet Take 1 tablet (50 mg total) by mouth every 6 (six) hours as needed. 03/27/16   Carlene CoriaSerena Y Annsleigh Dragoo, PA-C    Family History Family History  Problem Relation Age of Onset  . Arthritis Mother   . Bronchitis Mother   . Asthma Mother   . Hearing loss Paternal Grandfather   . Cancer Neg Hx   . Diabetes Neg Hx     Social History Social History  Substance Use Topics  .  Smoking status: Never Smoker  . Smokeless tobacco: Never Used  . Alcohol use 3.6 oz/week    6 Cans of beer per week     Allergies   Patient has no known allergies.   Review of Systems Review of Systems  Constitutional: Negative.   HENT: Negative.   Respiratory: Positive for cough.   Cardiovascular: Negative.   Gastrointestinal: Positive for abdominal pain, diarrhea, nausea and vomiting.  Musculoskeletal: Negative.   Skin: Negative.   Neurological: Negative.   Psychiatric/Behavioral: Negative.   All other systems reviewed and are negative.    Physical Exam Updated Vital Signs BP 126/68   Pulse 82   Temp 98.8 F (37.1 C) (Oral)   Resp 14   SpO2 100%   Physical Exam  Constitutional: She appears well-developed and well-nourished.  HENT:  Head: Normocephalic and atraumatic.  Eyes: Conjunctivae are normal. Pupils are equal, round, and reactive to light.  Neck: Neck supple. No tracheal deviation present. No thyromegaly present.  Cardiovascular: Normal rate and regular rhythm.   No murmur heard. Pulmonary/Chest: Effort normal and breath sounds normal.  Abdominal: Soft. Bowel sounds are normal. She exhibits no distension. There is no tenderness.  Musculoskeletal: Normal range of motion. She exhibits no edema or tenderness.  Neurological: She is alert. Coordination normal.  Skin: Skin is warm and dry. No rash noted.  Psychiatric: She has a normal mood and affect.  Nursing note and vitals reviewed.    ED Treatments / Results  Labs (all labs ordered are listed, but only abnormal results are displayed) Labs Reviewed  COMPREHENSIVE METABOLIC PANEL - Abnormal; Notable for the following:       Result Value   Glucose, Bld 145 (*)    Total Bilirubin 1.6 (*)    All other components within normal limits  LIPASE, BLOOD  CBC  URINALYSIS, ROUTINE W REFLEX MICROSCOPIC  I-STAT BETA HCG BLOOD, ED ([facility], [facility], [facility] ONLY)    EKG  EKG Interpretation None       Radiology No  results found.  Procedures Procedures (including critical care time)  Medications Ordered in ED Medications - No data to display  X-rays viewed by me Results for orders placed or performed during the hospital encounter of 08/02/16  Lipase, blood  Result Value Ref Range   Lipase 23 11 - 51 U/L  Comprehensive metabolic panel  Result Value Ref Range   Sodium 140 135 - 145 mmol/L   Potassium 3.6 3.5 - 5.1 mmol/L   Chloride 106 101 - 111 mmol/L   CO2 25 22 - 32 mmol/L   Glucose, Bld 145 (H) 65 - 99 mg/dL   BUN 15 6 - 20 mg/dL   Creatinine, Ser 1.610.93 0.44 - 1.00 mg/dL  Calcium 9.5 8.9 - 10.3 mg/dL   Total Protein 7.5 6.5 - 8.1 g/dL   Albumin 4.1 3.5 - 5.0 g/dL   AST 24 15 - 41 U/L   ALT 19 14 - 54 U/L   Alkaline Phosphatase 61 38 - 126 U/L   Total Bilirubin 1.6 (H) 0.3 - 1.2 mg/dL   GFR calc non Af Amer >60 >60 mL/min   GFR calc Af Amer >60 >60 mL/min   Anion gap 9 5 - 15  CBC  Result Value Ref Range   WBC 5.5 4.0 - 10.5 K/uL   RBC 4.57 3.87 - 5.11 MIL/uL   Hemoglobin 13.8 12.0 - 15.0 g/dL   HCT 08.6 57.8 - 46.9 %   MCV 89.1 78.0 - 100.0 fL   MCH 30.2 26.0 - 34.0 pg   MCHC 33.9 30.0 - 36.0 g/dL   RDW 62.9 52.8 - 41.3 %   Platelets 234 150 - 400 K/uL  Urinalysis, Routine w reflex microscopic  Result Value Ref Range   Color, Urine YELLOW YELLOW   APPearance HAZY (A) CLEAR   Specific Gravity, Urine 1.025 1.005 - 1.030   pH 6.0 5.0 - 8.0   Glucose, UA NEGATIVE NEGATIVE mg/dL   Hgb urine dipstick NEGATIVE NEGATIVE   Bilirubin Urine NEGATIVE NEGATIVE   Ketones, ur NEGATIVE NEGATIVE mg/dL   Protein, ur NEGATIVE NEGATIVE mg/dL   Nitrite NEGATIVE NEGATIVE   Leukocytes, UA NEGATIVE NEGATIVE  I-Stat beta hCG blood, ED  Result Value Ref Range   I-stat hCG, quantitative <5.0 <5 mIU/mL   Comment 3           Dg Abd Acute W/chest  Result Date: 08/02/2016 CLINICAL DATA:  Generalized abdominal pain with nausea vomiting. EXAM: DG ABDOMEN ACUTE W/ 1V CHEST COMPARISON:  CT scan  03/27/2016. FINDINGS: The lungs are clear wiithout focal pneumonia, edema, pneumothorax or pleural effusion. The cardiopericardial silhouette is within normal limits for size. The visualized bony structures of the thorax are intact. Upright film shows no evidence for intraperitoneal free air. There is no evidence for gaseous bowel dilation to suggest obstruction. IUD projects over the left paramidline anatomic pelvis. Visualized bony anatomy is unremarkable. IMPRESSION: Negative abdominal radiographs.  No acute cardiopulmonary disease. Electronically Signed   By: Kennith Center M.D.   On: 08/02/2016 19:22   Initial Impression / Assessment and Plan / ED Course  I have reviewed the triage vital signs and the nursing notes.  Pertinent labs & imaging results that were available during my care of the patient were reviewed by me and considered in my medical decision making (see chart for details).  Clinical Course   Clinically patient does not appear to be dehydrated  9:30 PM nausea has resolved after treatment with Zofran. She is able to drink without difficulty. She continues to complain of abdominal pain after treatment with Tylenol however abdomen remains nontender. Symptoms are consistent with viral illness with vomiting diarrhea and cough. Plan Imodium prescription Zofran. Avoid dairy. Encourage oral hydration. Referral [place] [facility] wellness [facility]. Final Clinical Impressions(s) / ED Diagnoses  Diagnoses #1 nausea vomiting diarrhea #2 abdominal pain #3 cough Final diagnoses:  None    New Prescriptions New Prescriptions   No medications on file     Doug Sou, MD 08/02/16 2145

## 2016-08-02 NOTE — Discharge Instructions (Signed)
Drink at least six 8 ounce glasses of water or Gatorade each day in order to stay well-hydrated. Take Tylenol as directed for pain Avoid milk or foods containing milk such as cheese or ice cream all having diarrhea. Take Imodium as directed for diarrhea. You can take up to 8 tablets per day. Take the medication prescribed as needed for nausea. [facility] next week to get a primary care doctor and to be seen if not feeling better or you can call the 800 number on these instructions. Return if you are unable to hold down fluids without vomiting after taking the medication prescribed or if concern for any reason

## 2016-08-02 NOTE — ED Triage Notes (Signed)
Pt reports N/V/D and abdominal pain for 1 week.

## 2016-08-18 ENCOUNTER — Emergency Department (HOSPITAL_COMMUNITY)
Admission: EM | Admit: 2016-08-18 | Discharge: 2016-08-19 | Disposition: A | Discharge status: Other Acute Inpatient Hospital | Payer: Medicaid Other | Attending: Emergency Medicine | Admitting: Emergency Medicine

## 2016-08-18 DIAGNOSIS — Z79899 Other long term (current) drug therapy: Secondary | ICD-10-CM | POA: Insufficient documentation

## 2016-08-18 DIAGNOSIS — Y638 Failure in dosage during other surgical and medical care: Secondary | ICD-10-CM | POA: Insufficient documentation

## 2016-08-18 DIAGNOSIS — T50902A Poisoning by unspecified drugs, medicaments and biological substances, intentional self-harm, initial encounter: Secondary | ICD-10-CM

## 2016-08-18 DIAGNOSIS — F332 Major depressive disorder, recurrent severe without psychotic features: Secondary | ICD-10-CM | POA: Diagnosis not present

## 2016-08-18 HISTORY — DX: Bipolar disorder, unspecified: F31.9

## 2016-08-18 NOTE — ED Notes (Signed)
Bed: WA26 Expected date:  Expected time:  Means of arrival:  Comments: 

## 2016-08-18 NOTE — ED Triage Notes (Signed)
Per [facility], pt left a very disturbing suicide note, has not taken her meds in 3-4 days.  Pt has motion sickness & began vomiting during transport.

## 2016-08-18 NOTE — ED Notes (Signed)
Bed: XL24WA28 Expected date:  Expected time:  Means of arrival:  Comments: 21 yo F/SI

## 2016-08-19 ENCOUNTER — Inpatient Hospital Stay
Admission: EM | Admit: 2016-08-19 | Discharge: 2016-08-23 | DRG: 885 | Disposition: A | Discharge status: Home / Self Care | Payer: No Typology Code available for payment source | Source: Intra-hospital | Attending: Psychiatry | Admitting: Psychiatry

## 2016-08-19 ENCOUNTER — Encounter (HOSPITAL_COMMUNITY): Payer: Self-pay | Admitting: *Deleted

## 2016-08-19 DIAGNOSIS — Z9049 Acquired absence of other specified parts of digestive tract: Secondary | ICD-10-CM | POA: Diagnosis not present

## 2016-08-19 DIAGNOSIS — N760 Acute vaginitis: Secondary | ICD-10-CM | POA: Diagnosis present

## 2016-08-19 DIAGNOSIS — T50902A Poisoning by unspecified drugs, medicaments and biological substances, intentional self-harm, initial encounter: Secondary | ICD-10-CM | POA: Diagnosis not present

## 2016-08-19 DIAGNOSIS — Z825 Family history of asthma and other chronic lower respiratory diseases: Secondary | ICD-10-CM

## 2016-08-19 DIAGNOSIS — Z8261 Family history of arthritis: Secondary | ICD-10-CM

## 2016-08-19 DIAGNOSIS — Z9889 Other specified postprocedural states: Secondary | ICD-10-CM

## 2016-08-19 DIAGNOSIS — F4010 Social phobia, unspecified: Secondary | ICD-10-CM | POA: Diagnosis present

## 2016-08-19 DIAGNOSIS — K219 Gastro-esophageal reflux disease without esophagitis: Secondary | ICD-10-CM | POA: Diagnosis present

## 2016-08-19 DIAGNOSIS — Z915 Personal history of self-harm: Secondary | ICD-10-CM

## 2016-08-19 DIAGNOSIS — F332 Major depressive disorder, recurrent severe without psychotic features: Secondary | ICD-10-CM | POA: Diagnosis present

## 2016-08-19 DIAGNOSIS — N390 Urinary tract infection, site not specified: Secondary | ICD-10-CM | POA: Diagnosis present

## 2016-08-19 DIAGNOSIS — G4700 Insomnia, unspecified: Secondary | ICD-10-CM | POA: Diagnosis present

## 2016-08-19 LAB — URINALYSIS, ROUTINE W REFLEX MICROSCOPIC
Bilirubin Urine: NEGATIVE
Glucose, UA: NEGATIVE mg/dL
Hgb urine dipstick: NEGATIVE
Ketones, ur: 80 mg/dL — AB
Nitrite: NEGATIVE
Protein, ur: 100 mg/dL — AB
RBC / HPF: NONE SEEN RBC/hpf (ref 0–5)
SPECIFIC GRAVITY, URINE: 1.03 (ref 1.005–1.030)
pH: 5 (ref 5.0–8.0)

## 2016-08-19 LAB — SALICYLATE LEVEL

## 2016-08-19 LAB — COMPREHENSIVE METABOLIC PANEL
ALBUMIN: 4.8 g/dL (ref 3.5–5.0)
ALK PHOS: 65 U/L (ref 38–126)
ALT: 20 U/L (ref 14–54)
AST: 26 U/L (ref 15–41)
Anion gap: 5 (ref 5–15)
BILIRUBIN TOTAL: 1.2 mg/dL (ref 0.3–1.2)
BUN: 15 mg/dL (ref 6–20)
CALCIUM: 9.4 mg/dL (ref 8.9–10.3)
CO2: 24 mmol/L (ref 22–32)
Chloride: 108 mmol/L (ref 101–111)
Creatinine, Ser: 0.65 mg/dL (ref 0.44–1.00)
GFR calc Af Amer: >60 mL/min (ref >60)
GFR calc non Af Amer: >60 mL/min (ref >60)
GLUCOSE: 103 mg/dL — AB (ref 65–99)
POTASSIUM: 3 mmol/L — AB (ref 3.5–5.1)
Sodium: 137 mmol/L (ref 135–145)
TOTAL PROTEIN: 8 g/dL (ref 6.5–8.1)

## 2016-08-19 LAB — ETHANOL

## 2016-08-19 LAB — CBC WITH DIFFERENTIAL/PLATELET
BASOS PCT: 0 %
Basophils Absolute: 0 10*3/uL (ref 0.0–0.1)
EOS ABS: 0.1 10*3/uL (ref 0.0–0.7)
Eosinophils Relative: 1 %
HEMATOCRIT: 36 % (ref 36.0–46.0)
HEMOGLOBIN: 12.5 g/dL (ref 12.0–15.0)
LYMPHS ABS: 2.2 10*3/uL (ref 0.7–4.0)
Lymphocytes Relative: 31 %
MCH: 30 pg (ref 26.0–34.0)
MCHC: 34.7 g/dL (ref 30.0–36.0)
MCV: 86.3 fL (ref 78.0–100.0)
Monocytes Absolute: 0.4 10*3/uL (ref 0.1–1.0)
Monocytes Relative: 5 %
Neutro Abs: 4.5 10*3/uL (ref 1.7–7.7)
Neutrophils Relative %: 63 %
Platelets: 242 10*3/uL (ref 150–400)
RBC: 4.17 MIL/uL (ref 3.87–5.11)
RDW: 12.7 % (ref 11.5–15.5)
WBC: 7.1 10*3/uL (ref 4.0–10.5)

## 2016-08-19 LAB — RAPID URINE DRUG SCREEN, HOSP PERFORMED
Amphetamines: NOT DETECTED
BARBITURATES: NOT DETECTED
Benzodiazepines: NOT DETECTED
Cocaine: NOT DETECTED
Opiates: NOT DETECTED
Tetrahydrocannabinol: NOT DETECTED

## 2016-08-19 LAB — I-STAT BETA HCG BLOOD, ED (MC, WL, AP ONLY)

## 2016-08-19 LAB — LIPASE, BLOOD: LIPASE: 31 U/L (ref 11–51)

## 2016-08-19 LAB — ACETAMINOPHEN LEVEL: Acetaminophen (Tylenol), Serum: 11 ug/mL (ref 10–30)

## 2016-08-19 MED ORDER — ALUM & MAG HYDROXIDE-SIMETH 200-200-20 MG/5ML PO SUSP: 30.0000 mL | ORAL | Status: DC | PRN

## 2016-08-19 MED ORDER — MIRTAZAPINE 15 MG PO TABS
15.0000 mg | ORAL_TABLET | Freq: Every day | ORAL | Status: DC
  Administered 2016-08-19 – 2016-08-22 (×4): 15 mg via ORAL
  Filled 2016-08-19 (×4): qty 1

## 2016-08-19 MED ORDER — IBUPROFEN 600 MG PO TABS
600.0000 mg | ORAL_TABLET | Freq: Three times a day (TID) | ORAL | Status: DC | PRN
  Administered 2016-08-19: 600 mg via ORAL
  Filled 2016-08-19 (×2): qty 1

## 2016-08-19 MED ORDER — DOCUSATE SODIUM 100 MG PO CAPS
100.0000 mg | ORAL_CAPSULE | Freq: Two times a day (BID) | ORAL | Status: DC
  Administered 2016-08-19: 100 mg via ORAL
  Filled 2016-08-19: qty 1

## 2016-08-19 MED ORDER — HYDROXYZINE HCL 25 MG PO TABS
25.0000 mg | ORAL_TABLET | Freq: Three times a day (TID) | ORAL | Status: DC | PRN
  Administered 2016-08-22: 25 mg via ORAL
  Filled 2016-08-19 (×3): qty 1

## 2016-08-19 MED ORDER — POTASSIUM CHLORIDE CRYS ER 20 MEQ PO TBCR
40.0000 meq | EXTENDED_RELEASE_TABLET | Freq: Once | ORAL | Status: AC
  Administered 2016-08-19: 40 meq via ORAL
  Filled 2016-08-19: qty 2

## 2016-08-19 MED ORDER — IBUPROFEN 200 MG PO TABS: 600.0000 mg | ORAL_TABLET | Freq: Three times a day (TID) | ORAL | Status: DC | PRN

## 2016-08-19 MED ORDER — SODIUM CHLORIDE 0.9 % IV BOLUS (SEPSIS)
1000.0000 mL | Freq: Once | INTRAVENOUS | Status: AC
  Administered 2016-08-19: 1000 mL via INTRAVENOUS

## 2016-08-19 MED ORDER — TRAZODONE HCL 100 MG PO TABS
100.0000 mg | ORAL_TABLET | Freq: Every evening | ORAL | Status: DC | PRN
  Administered 2016-08-21: 100 mg via ORAL
  Filled 2016-08-19: qty 1

## 2016-08-19 MED ORDER — LORAZEPAM 1 MG PO TABS: 1.0000 mg | ORAL_TABLET | Freq: Three times a day (TID) | ORAL | Status: DC | PRN

## 2016-08-19 MED ORDER — FOSFOMYCIN TROMETHAMINE 3 G PO PACK
3.0000 g | PACK | Freq: Once | ORAL | Status: AC
  Administered 2016-08-19: 3 g via ORAL
  Filled 2016-08-19: qty 3

## 2016-08-19 MED ORDER — DOCUSATE SODIUM 100 MG PO CAPS
100.0000 mg | ORAL_CAPSULE | Freq: Two times a day (BID) | ORAL | Status: DC
  Administered 2016-08-19 – 2016-08-23 (×8): 100 mg via ORAL
  Filled 2016-08-19 (×8): qty 1

## 2016-08-19 MED ORDER — DIVALPROEX SODIUM 250 MG PO DR TAB: 250.0000 mg | DELAYED_RELEASE_TABLET | Freq: Three times a day (TID) | ORAL | Status: DC

## 2016-08-19 MED ORDER — ONDANSETRON HCL 4 MG PO TABS
4.0000 mg | ORAL_TABLET | Freq: Three times a day (TID) | ORAL | Status: DC | PRN
  Administered 2016-08-22: 4 mg via ORAL
  Filled 2016-08-19: qty 1

## 2016-08-19 MED ORDER — ACETAMINOPHEN 325 MG PO TABS
650.0000 mg | ORAL_TABLET | Freq: Four times a day (QID) | ORAL | Status: DC | PRN
  Administered 2016-08-22: 650 mg via ORAL
  Filled 2016-08-19: qty 2

## 2016-08-19 MED ORDER — MAGNESIUM HYDROXIDE 400 MG/5ML PO SUSP: 30.0000 mL | Freq: Every day | ORAL | Status: DC | PRN

## 2016-08-19 MED ORDER — METRONIDAZOLE 500 MG PO TABS
500.0000 mg | ORAL_TABLET | Freq: Two times a day (BID) | ORAL | Status: DC
  Administered 2016-08-19 – 2016-08-22 (×6): 500 mg via ORAL
  Filled 2016-08-19 (×8): qty 1

## 2016-08-19 MED ORDER — ONDANSETRON HCL 4 MG/2ML IJ SOLN
4.0000 mg | Freq: Once | INTRAMUSCULAR | Status: AC
  Administered 2016-08-19: 4 mg via INTRAVENOUS
  Filled 2016-08-19: qty 2

## 2016-08-19 MED ORDER — ONDANSETRON HCL 4 MG PO TABS: 4.0000 mg | ORAL_TABLET | Freq: Three times a day (TID) | ORAL | Status: DC | PRN

## 2016-08-19 MED ORDER — LAMOTRIGINE 25 MG PO TABS
25.0000 mg | ORAL_TABLET | Freq: Every day | ORAL | Status: DC
  Administered 2016-08-19 – 2016-08-22 (×4): 25 mg via ORAL
  Filled 2016-08-19 (×4): qty 1

## 2016-08-19 NOTE — Progress Notes (Signed)
Patient is sad &tearful but cooperative during admission assessment. Patient denies SI/HI at this time. Patient denies AVH. Patient informed of fall risk status, fall risk assessed "low" at this time. Patient oriented to unit/staff/room. Patient denies any questions/concerns at this time. Patient safe on unit with Q15 minute checks for safety. Skin assessment & body search done.No contraband found. 

## 2016-08-19 NOTE — ED Notes (Signed)
Pt now stating "I took Dayquil and tylenol."

## 2016-08-19 NOTE — ED Notes (Signed)
Called lab to f/u on U/A, spoke with Joni ReiningNicole.
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
## 2016-08-19 NOTE — ED Notes (Signed)
Poison control notified and informed them of repeat lab results and they advised that case would be closed out at this time.

## 2016-08-19 NOTE — [facility] Suicide Risk Assessment (Signed)
[facility]   Nursing information obtained from:  Patient Demographic factors:  Living alone Current Mental Status:  Self-harm thoughts Loss Factors:  NA Historical Factors:  Prior suicide attempts Risk Reduction Factors:  Responsible for children under 22 years of age, Employed  Total Time spent with patient: 1 hour Principal Problem: Major depressive disorder, recurrent severe without psychotic features (HCC) Diagnosis:   Patient Active Problem List   Diagnosis Date Noted  . Major depressive disorder, recurrent severe without psychotic features (HCC) [F33.2] 08/19/2016  . UTI (urinary tract infection) [N39.0] 08/19/2016  . Suicide attempt by drug ingestion (HCC) [T50.902A]   . Contraception [Z30.9] 03/10/2014  . Biliary colic [K80.50] 03/10/2014  . BV (bacterial vaginosis) [N76.0, B96.89] 03/10/2014  . Depression [F32.9] 03/16/2013  . Allergic rhinitis, seasonal [J30.2] 01/26/2013  . Anemia, iron deficiency [D50.9] 12/01/2012   Subjective Data: overdose.  Continued Clinical Symptoms:  Alcohol Use Disorder Identification Test Final Score (AUDIT): 1 The "Alcohol Use Disorders Identification Test", Guidelines for Use in Primary Care, Second Edition.  World Science writerHealth Organization [facility](WHO). Score between 0-7:  no or low risk or alcohol related problems. Score between 8-15:  moderate risk of alcohol related problems. Score between 16-19:  high risk of alcohol related problems. Score 20 or above:  warrants further diagnostic evaluation for alcohol dependence and treatment.   CLINICAL FACTORS:   Bipolar Disorder:   Depressive phase Depression:   Impulsivity   Musculoskeletal: Strength & Muscle Tone: within normal limits Gait & Station: normal Patient leans: N/A  Psychiatric Specialty Exam: Physical Exam  Nursing note and vitals reviewed. Psychiatric: Her speech is normal. She is slowed and withdrawn. Cognition and memory are normal. She expresses  impulsivity. She exhibits a depressed mood. She expresses suicidal ideation. She expresses suicidal plans.    Review of Systems  All other systems reviewed and are negative.   Blood pressure 108/68, pulse 68, temperature 98.6 F (37 C), temperature source Oral, resp. rate 16, height 5' (1.524 m), weight 46.7 kg (103 lb), SpO2 99 %.Body mass index is 20.12 kg/m.  General Appearance: Casual  Eye Contact:  Minimal  Speech:  Slow  Volume:  Decreased  Mood:  Anxious and Depressed  Affect:  Tearful  Thought Process:  Goal Directed and Descriptions of Associations: Intact  Orientation:  Full (Time, Place, and Person)  Thought Content:  WDL  Suicidal Thoughts:  Yes.  with intent/plan  Homicidal Thoughts:  No  Memory:  Immediate;   Fair Recent;   Fair Remote;   Fair  Judgement:  Poor  Insight:  Shallow  Psychomotor Activity:  Psychomotor Retardation  Concentration:  Concentration: Fair and Attention Span: Fair  Recall:  [employer]  Fund of Knowledge:  Fair  Language:  Fair  Akathisia:  No  Handed:  Right  AIMS (if indicated):     Assets:  ArchitectCommunication Skills Financial Resources/Insurance Physical Health Resilience Social Support Talents/Skills Transportation Vocational/Educational  ADL's:  Intact  Cognition:  WNL  Sleep:         COGNITIVE FEATURES THAT CONTRIBUTE TO RISK:  None    SUICIDE RISK:   Moderate:  Frequent suicidal ideation with limited intensity, and duration, some specificity in terms of plans, no associated intent, good self-control, limited dysphoria/symptomatology, some risk factors present, and identifiable protective factors, including available and accessible social support.  PLAN OF CARE: Hospital admission, medication management, discharge planning.  Ms. Early CharsSiler is a 21 year old female with a history of depression, anxiety, and mood is needed  after suicide attempt by medication overdose in the context of social stressors.  1. Suicidal ideation. The patient  is able to contract for safety in the hospital.   2. Mood. She has been maintained on a combination of Remeron and low-dose Depakote in the community. She has not been compliant with medications. We will restart Remeron and start Lamictal to stabilize her mood.  3. UTI. We ordered urine culture and gave her a dose of fosfomycin.  4. Vaginosis. We will start metronidazole.  5. Anxiety. Hydroxyzine is available.  6. Insomnia. Trazodone is available.  7. Disposition. She will be discharged back to her apartment. She will follow up with [facility] [place].  I certify that inpatient services furnished can reasonably be expected to improve the patient's condition.   Kristine Linea, MD 08/19/2016, 1:46 PM

## 2016-08-19 NOTE — [facility] Group Notes (Signed)
[facility] Group Notes:  (Nursing/MHT/Case Management/Adjunct)  Date:  08/19/2016  Time:  3:45 PM  Type of Therapy:  Psychoeducational Skills  Participation Level:  Did Not Attend  Lynelle SmokeCara Travis [facility] 08/19/2016, 3:45 PM

## 2016-08-19 NOTE — ED Notes (Signed)
Poison Control called to inquire about pt status and lab results. Results given and advised Poison Control that they would be contacted with repeat results of Tylenol level.
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
## 2016-08-19 NOTE — [facility] Group Notes (Signed)
[facility] Group Notes:  (Nursing/MHT/Case Management/Adjunct)  Date:  08/19/2016  Time:  9:33 PM  Type of Therapy:  Psychoeducational Skills  Participation Level:  Did Not Attend  Participation Quality:   Summary of Progress/Problems:  Mayra NeerJackie L Gene Glazebrook 08/19/2016, 9:33 PM

## 2016-08-19 NOTE — BH Assessment (Signed)
[facility] per Marylu LundJanet, RN, Charge Nurse by Dr. Jennet MaduroPucilowska. Dr. Jennet MaduroPucilowska attending. Patient to sign voluntary consent to treat and ROI faxed to [phone]. Call nurse report to [phone]. Patients nurse informed of acceptance.   Leah PokeJoVea Alondra Sahni, LCSW Therapeutic Triage Specialist [facility] 08/19/2016 9:46 AM
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
## 2016-08-19 NOTE — BH Assessment (Signed)
Patient reviewed and signed voluntary consent to treat and ROI to her mother. Patient denies any questions or concerns at this time. [facility] [phone] and given to patients nurse [facility].    Davina PokeJoVea Yazlynn Birkeland, LCSW Therapeutic Triage Specialist [facility] 08/19/2016 10:02 AM

## 2016-08-19 NOTE — ED Notes (Signed)
Sitter at bedside.  Assisted patient to restroom.

## 2016-08-19 NOTE — ED Notes (Signed)
Pt vomiting upon arrival

## 2016-08-19 NOTE — ED Provider Notes (Signed)
[facility]-EMERGENCY DEPT Provider Note   CSN: [id number] Arrival date & time: 08/18/16  2341   By signing my name below, I, Teofilo Pod, attest that this documentation has been prepared under the direction and in the presence of Zadie Rhine, MD . Electronically Signed: Teofilo Pod, ED Scribe. 08/19/2016. 12:15 AM.   History   Chief Complaint Chief Complaint  Patient presents with  . Suicidal   LEVEL 5 CAVEAT DUE TO PSYCHIATRIC CONDITION  The history is provided by the patient. No language interpreter was used.   HPI Comments:  Leah [name] is a 21 y.o. female who presents to the Emergency Department via [facility] due to suicidal ideation that began yesterday. Pt states that today she took a large amount of unknown medication. However per nurse, pt stopped taking her medication 3-4 days ago. EMS reports that she left a "disturbing" suicide note, and the pt does not remember what she wrote. Pt complains of associated abdominal pain. [employer] reports that they found an empty Benztropine bottle at her home, and that the pt vomited en route to the ED in the ambulance. No alleviating factors noted. Pt has no other complaints.   Past Medical History:  Diagnosis Date  . Anemia 2013  . Anxiety 2011   no meds during pregnancy  . Chlamydia   . Gall stones   . GERD (gastroesophageal reflux disease)   . 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 Seizure [facility]) 2011   pseudoseizures    Patient Active Problem List   Diagnosis Date Noted  . Suicide attempt by drug ingestion (HCC)   . MDD (major depressive disorder), recurrent episode, moderate (HCC) 12/07/2015  . Contraception 03/10/2014  . Biliary colic 03/10/2014  . BV (bacterial vaginosis) 03/10/2014  . Depression 03/16/2013  . Allergic rhinitis, seasonal 01/26/2013  . Anemia, iron deficiency 12/01/2012    Past Surgical History:  Procedure Laterality Date  . CHOLECYSTECTOMY N/A 09/28/2014   Procedure:  LAPAROSCOPIC CHOLECYSTECTOMY;  Surgeon: Axel Filler, MD;  Location: [facility] OR;  Service: General;  Laterality: N/A;  . CHOLECYSTECTOMY    . DIRECT LARYNGOSCOPY N/A 04/24/2014   Procedure: DIRECT LARYNGOSCOPY;  Surgeon: [name], MD;  [facility];  Service: ENT;  Laterality: N/A;  . FOREIGN BODY REMOVAL ESOPHAGEAL N/A 04/24/2014   Procedure: REMOVAL FOREIGN BODY ESOPHAGEAL;  Surgeon: [name], MD;  Location: [facility] OR;  Service: ENT;  Laterality: N/A;  . WISDOM TOOTH EXTRACTION  2013    OB History    Gravida Para Term Preterm AB Living   1 1 1  0 0 1   SAB TAB Ectopic Multiple Live Births   0 0 0   1       Home Medications    Prior to Admission medications   Medication Sig Start Date End Date Taking? Authorizing Provider  divalproex (DEPAKOTE) 250 MG DR tablet Take 250 mg by mouth 2 (two) times daily.     Historical Provider, MD  DM-Doxylamine-Acetaminophen (NYQUIL COLD & FLU PO) Take 1 capsule by mouth at bedtime as needed.    Historical Provider, MD  hydrOXYzine (ATARAX/VISTARIL) 25 MG tablet Take 1 tablet (25 mg total) by mouth every 6 (six) hours as needed (anxiety/agitation or CIWA < or = 10). Patient not taking: Reported on 08/02/2016 12/11/15   Adonis Brook, NP  ibuprofen (ADVIL,MOTRIN) 600 MG tablet Take 1 tablet (600 mg total) by mouth every 6 (six) hours as needed for  moderate pain. Patient not taking: Reported on 08/02/2016 03/13/16   Shaune Pollackameron Isaacs, MD  indomethacin (INDOCIN) 25 MG capsule Take 25 mg by mouth 3 (three) times daily as needed for mild pain or moderate pain.     Historical Provider, MD  levonorgestrel (MIRENA) 20 MCG/24HR IUD 1 each by Intrauterine route once. Placed in 09/2015    Historical Provider, MD  loratadine (CLARITIN) 10 MG tablet Take 1 tablet (10 mg total) by mouth daily. Patient not taking: Reported on 08/02/2016 10/29/15   Rolm GalaStevi Barrett, PA-C  mirtazapine (REMERON) 15 MG tablet Take 1 tablet (15 mg total) by mouth at bedtime. 12/11/15   Adonis BrookSheila Agustin,  NP  ondansetron (ZOFRAN) 8 MG tablet Take 1 tablet (8 mg total) by mouth every 8 (eight) hours as needed for nausea or vomiting. 08/02/16   Doug SouSam Jacubowitz, MD  traMADol (ULTRAM) 50 MG tablet Take 1 tablet (50 mg total) by mouth every 6 (six) hours as needed. Patient not taking: Reported on 08/02/2016 03/27/16   Carlene CoriaSerena Y Sam, PA-C    Family History Family History  Problem Relation Age of Onset  . Arthritis Mother   . Bronchitis Mother   . Asthma Mother   . Hearing loss Paternal Grandfather   . Cancer Neg Hx   . Diabetes Neg Hx     Social History Social History  Substance Use Topics  . Smoking status: Never Smoker  . Smokeless tobacco: Never Used  . Alcohol use 3.6 oz/week    6 Cans of beer per week     Allergies   Patient has no known allergies.   Review of Systems Review of Systems  Unable to perform ROS: Other     Physical Exam Updated Vital Signs BP 120/88 (BP Location: Left Arm)   Pulse 98   Temp 98.3 F (36.8 C) (Oral)   Resp 18   SpO2 97%   Physical Exam CONSTITUTIONAL: Disheveled, anxious HEAD: Normocephalic/atraumatic EYES: EOMI ENMT: Mucous membranes moist NECK: supple no meningeal signs SPINE/BACK:entire spine nontender CV: S1/S2 noted, no murmurs/rubs/gallops noted LUNGS: Lungs are clear to auscultation bilaterally, no apparent distress ABDOMEN: soft, mild diffuse tenderness, no rebound or guarding, bowel sounds noted throughout abdomen GU:no cva tenderness NEURO: Pt is awake/alert/appropriate, moves all extremitiesx4.  No facial droop.   EXTREMITIES: pulses normal/equal, full ROM SKIN: warm, color normal PSYCH: anxious and tearful.   ED Treatments / Results  DIAGNOSTIC STUDIES:  Oxygen Saturation is 97% on RA, normal by my interpretation.    COORDINATION OF CARE:  12:15 AM Will order blood work. Discussed treatment plan with pt at bedside and pt agreed to plan.   Labs (all labs ordered are listed, but only abnormal results are  displayed) Labs Reviewed  COMPREHENSIVE METABOLIC PANEL - Abnormal; Notable for the following:       Result Value   Potassium 3.0 (*)    Glucose, Bld 103 (*)    All other components within normal limits  ACETAMINOPHEN LEVEL - Abnormal; Notable for the following:    Acetaminophen (Tylenol), Serum <10 (*)    All other components within normal limits  CBC WITH DIFFERENTIAL/PLATELET  LIPASE, BLOOD  ETHANOL  ACETAMINOPHEN LEVEL  SALICYLATE LEVEL  RAPID URINE DRUG SCREEN, HOSP PERFORMED  SALICYLATE LEVEL  I-STAT BETA HCG BLOOD, ED ([facility], [facility], [facility] ONLY)    EKG  EKG Interpretation  Date/Time:  Monday August 19 2016 00:48:09 EST Ventricular Rate:  71 PR Interval:  188 QRS Duration: 84 QT Interval:  390 QTC  Calculation: 423 R Axis:   76 Text Interpretation:  Normal sinus rhythm Artifact Non-specific ST-t changes Abnormal ekg Confirmed by Bebe Shaggy  MD, Dorinda Hill ([id number]) on 08/19/2016 1:03:12 AM       Radiology No results found.  Procedures Procedures   Medications Ordered in ED Medications  ondansetron (ZOFRAN) tablet 4 mg (not administered)  ibuprofen (ADVIL,MOTRIN) tablet 600 mg (not administered)  LORazepam (ATIVAN) tablet 1 mg (not administered)  ondansetron (ZOFRAN) injection 4 mg (4 mg Intravenous Given 08/19/16 0108)  sodium chloride 0.9 % bolus 1,000 mL (0 mLs Intravenous Stopped 08/19/16 0334)  potassium chloride SA (K-DUR,KLOR-CON) CR tablet 40 mEq (40 mEq Oral Given 08/19/16 0333)     Initial Impression / Assessment and Plan / ED Course  I have reviewed the triage vital signs and the nursing notes.  Pertinent labs results that were available during my care of the patient were reviewed by me and considered in my medical decision making (see chart for details).     1:37 AM Pt now reporting she took APAP/dayquil Labs pending at this time 4:31 AM Pt medically stable Repeat APAP level negative Will consult psych Pt awake/alert, no distress  Final Clinical  Impressions(s) / ED Diagnoses   Final diagnoses:  Intentional drug overdose, initial encounter [facility])    New Prescriptions New Prescriptions   No medications on file  I personally performed the services described in this documentation, which was scribed in my presence. The recorded information has been reviewed and is accurate.        Zadie Rhine, MD 08/19/16 [phone]

## 2016-08-19 NOTE — ED Notes (Addendum)
Pt reports to taking appx 6-8 Tylenol and 12 Dayquil at appx 2200.

## 2016-08-19 NOTE — ED Notes (Signed)
Patient blood levels sent to lab.

## 2016-08-19 NOTE — ED Notes (Signed)
Bed: WA17 Expected date:  Expected time:  Means of arrival:  Comments: Res A 

## 2016-08-19 NOTE — ED Notes (Addendum)
Pt stated "I took pills, my pills.  I was mad at myself.  I haven't taken my meds in 3-4 days because they don't work.".  [employer] provided 2 drug names of mirtazipine and benzotropine & stated "both bottles were empty"  Pt also stated "a co-worker is threatening me, it was today only.  It was a guy and girl that was staying with me and I got in trouble with my landlord so I told them they had to go".

## 2016-08-19 NOTE — Tx Team (Signed)
Initial Treatment Plan 08/19/2016 1:34 PM Leah [name] ZOX:[id number]    PATIENT STRESSORS: Medication change or noncompliance Traumatic event   PATIENT STRENGTHS: Average or above average intelligence Capable of independent living Communication skills Work skills   PATIENT IDENTIFIED PROBLEMS: Major depression 08/19/2016  Suicidal attempt 1/29/.2018                   DISCHARGE CRITERIA:  Ability to meet basic life and health needs Adequate post-discharge living arrangements Improved stabilization in mood, thinking, and/or behavior  PRELIMINARY DISCHARGE PLAN: Attend aftercare/continuing care group Return to previous living arrangement Return to previous work or school arrangements  PATIENT/FAMILY INVOLVEMENT: This treatment plan has been presented to and reviewed with the patient, Leah [name], and/or family member,   The patient and family have been given the opportunity to ask questions and make suggestions.  Leah SalonGigi George Navreet Bolda, RN 08/19/2016, 1:34 PM

## 2016-08-19 NOTE — ED Notes (Signed)
Poison Control notified and advised to monitor for 6hr post ingestion along with 4 hr Tylenol Level and monitor for any changes in EKG.

## 2016-08-20 MED ORDER — ENSURE ENLIVE PO LIQD
237.0000 mL | Freq: Two times a day (BID) | ORAL | Status: DC
  Administered 2016-08-20 – 2016-08-22 (×5): 237 mL via ORAL

## 2016-08-20 NOTE — Progress Notes (Signed)
D: Pt denies SI/HI/AVH. Pt is pleasant and cooperative. Pt appears less anxious and she is interacting with staff appropriately.  A: Pt was offered support and encouragement. Pt was given scheduled medications. Pt was encouraged to attend groups. Q 15 minute checks were done for safety.  R:Pt did not attend group. Pt is taking medication. Pt has no complaints.Pt receptive to treatment and safety maintained on unit.

## 2016-08-20 NOTE — Plan of Care (Signed)
Problem: [facility] Participation in Recreation Therapeutic Interventions Goal: STG-Patient will identify at least five coping skills for ** STG: Coping Skills - Within 4 treatment sessions, patient will verbalize at least 5 coping skills for anger in each of 2 treatment sessions to increase anger management skills post d/c.  Outcome: Progressing Treatment Session 1; Completed 1 out of 2: At approximately 2:15 pm, LRT met with patient in consultation room. Patient verbalized 5 coping skills for anger. Patient verbalized what triggers her to get angry, and how her body responds to anger. LRT provided suggestions on ways patient can remind herself to use her healthy coping skills.  Leonette Monarch, LRT/CTRS 01.30.18 2:51 pm Goal: STG-Other Recreation Therapy Goal (Specify) STG: Stress Management - Within 4 treatment sessions, patient will verbalize understanding of the stress management techniques in each of 2 treatment sessions to increase stress management skills post d/c.  Outcome: Progressing Treatment Session 1; Completed 1 out of 2: At approximately 2:15 pm, LRT met with patient in consultation room. LRT educated and provided patient with handouts on stress management techniques. Patient verbalized understanding. LRT encouraged patient to read over and practice the stress management techniques.  Leonette Monarch, LRT/CTRS 01.30.18 2:52 pm

## 2016-08-20 NOTE — Progress Notes (Signed)
Patient isolative to self and room. Forwards little. Denies SI, HI, AVH. Endorses sadness. Pt out for meals. Did not want to take Flagl, reports it makes her body sick.  Encouragement and support offered. Medication given as prescribed. Safety checks maintained.  Pt receptive. Remains isolative to self and room. Patient signed 72 hour request for discharge.  Remains safe on unit with q 15 min checks.

## 2016-08-20 NOTE — Progress Notes (Signed)
Recreation Therapy Notes  Date: 01.30.18 Time: 3:00 pm Location: Craft Room  Group Topic: Self-expression  Goal Area(s) Addresses:  Patient will be able to identify a color that represents each emotion. Patient will verbalize benefit of using art as a means of self-expression. Patient will verbalize one emotion experienced during group.  Behavioral Response: Attentive  Intervention: The Colors Within Me  Activity: Patients were given a blank face worksheet and were instructed to pick a color for each emotion they were feeling and show on the worksheet how much of that emotion they were feeling.  Education: LRT educated patients on different forms of self-expression.  Education Outcome: In group clarification offered  Clinical Observations/Feedback: Patient picked a color for each emotion she was feeling and showed on the worksheet how much of that emotion she was feeling. Patient did not contribute to group discussion.  Jacquelynn CreeGreene,Lorenda Grecco M, LRT/CTRS 08/20/2016 3:49 PM

## 2016-08-20 NOTE — Progress Notes (Signed)
[facility] MD Progress Note  08/20/2016 11:02 AM Leah [name]  MRN:  [id number]  Subjective:  [name] met with the treatment team today. She appears very depressed with severe psychomotor retardation and flat affect. She barely opens her eyes. She speaks in a whisper. She accepts medications and tolerates them well, including antibiotics. She still has symptoms of urinary tract infection. Urine culture is pending. She does not participate in programming.  Per Nursing: D: Pt denies SI/HI/AVH. Pt is pleasant and cooperative. Pt appears less anxious and she is interacting with staff appropriately.  A: Pt was offered support and encouragement. Pt was given scheduled medications. Pt was encouraged to attend groups. Q 15 minute checks were done for safety.  R:Pt did not attend group. Pt is taking medication. Pt has no complaints.Pt receptive to treatment and safety maintained on unit.  Principal Problem: Major depressive disorder, recurrent severe without psychotic features ([place]) Diagnosis:   Patient Active Problem List   Diagnosis Date Noted  . Major depressive disorder, recurrent severe without psychotic features ([place]) [F33.2] 08/19/2016  . UTI (urinary tract infection) [N39.0] 08/19/2016  . Suicide attempt by drug ingestion ([place]) [T50.902A]   . Contraception [Z30.9] 03/10/2014  . Biliary colic [M62.94] 76/54/6503  . BV (bacterial vaginosis) [N76.0, B96.89] 03/10/2014  . Depression [F32.9] 03/16/2013  . Allergic rhinitis, seasonal [J30.2] 01/26/2013  . Anemia, iron deficiency [D50.9] 12/01/2012   Total Time spent with patient: 20 minutes  Past Psychiatric History: Depression and anxiety.  Past Medical History:  Past Medical History:  Diagnosis Date  . Anemia 2013  . Anxiety 2011   no meds during pregnancy  . Bipolar 1 disorder ([place])   . Chlamydia   . Gall stones   . GERD (gastroesophageal reflux disease)   . 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 Seizure [facility]  2011   pseudoseizures    Past Surgical History:  Procedure Laterality Date  . CHOLECYSTECTOMY N/A 09/28/2014   Procedure: LAPAROSCOPIC CHOLECYSTECTOMY;  Surgeon: Ralene Ok, MD;  Location: [place];  Service: General;  Laterality: N/A;  . CHOLECYSTECTOMY    . DIRECT LARYNGOSCOPY N/A 04/24/2014   Procedure: DIRECT LARYNGOSCOPY;  Surgeon: Ascencion Dike, MD;  Location: [place];  Service: ENT;  Laterality: N/A;  . FOREIGN BODY REMOVAL ESOPHAGEAL N/A 04/24/2014   Procedure: REMOVAL FOREIGN BODY ESOPHAGEAL;  Surgeon: Ascencion Dike, MD;  Location: [facility] OR;  Service: ENT;  Laterality: N/A;  . WISDOM TOOTH EXTRACTION  2013   Family History:  Family History  Problem Relation Age of Onset  . Arthritis Mother   . Bronchitis Mother   . Asthma Mother   . Hearing loss Paternal Grandfather   . Cancer Neg Hx   . Diabetes Neg Hx    Family Psychiatric  History: None reported. Social History:  History  Alcohol Use No     History  Drug Use No    Social History   Social History  . Marital status: Single    Spouse name: N/A  . Number of children: 1  . Years of education: 29 th    Occupational History  . [employer]   Social History Main Topics  . Smoking status: Never Smoker  . Smokeless tobacco: Never Used  . Alcohol use No  . Drug use: No  . Sexual activity: Yes    Partners: Male    Birth control/ protection: IUD, None   Other Topics Concern  .  None   Social History Narrative   ** Merged History Encounter **       Live with roommate and son.    Starting at [employer].    Additional Social History:    History of alcohol / drug use?: Yes                    Sleep: Fair  Appetite:  Fair  Current Medications: Current Facility-Administered Medications  Medication Dose Route Frequency Provider Last Rate Last Dose  . acetaminophen (TYLENOL) tablet 650 mg  650 mg Oral Q6H PRN Patrecia Pour, NP      . alum & mag hydroxide-simeth (MAALOX/MYLANTA) 200-200-20 MG/5ML  suspension 30 mL  30 mL Oral Q4H PRN Patrecia Pour, NP      . docusate sodium (COLACE) capsule 100 mg  100 mg Oral BID Patrecia Pour, NP   100 mg at 08/20/16 0981  . hydrOXYzine (ATARAX/VISTARIL) tablet 25 mg  25 mg Oral TID PRN Clovis Fredrickson, MD      . ibuprofen (ADVIL,MOTRIN) tablet 600 mg  600 mg Oral Q8H PRN Patrecia Pour, NP   600 mg at 08/19/16 1257  . lamoTRIgine (LAMICTAL) tablet 25 mg  25 mg Oral QHS Clovis Fredrickson, MD   25 mg at 08/19/16 2224  . magnesium hydroxide (MILK OF MAGNESIA) suspension 30 mL  30 mL Oral Daily PRN Patrecia Pour, NP      . metroNIDAZOLE (FLAGYL) tablet 500 mg  500 mg Oral Q12H Larissa Pegg B Nickalas Mccarrick, MD   500 mg at 08/20/16 0836  . mirtazapine (REMERON) tablet 15 mg  15 mg Oral QHS Clovis Fredrickson, MD   15 mg at 08/19/16 2224  . ondansetron (ZOFRAN) tablet 4 mg  4 mg Oral Q8H PRN Patrecia Pour, NP      . traZODone (DESYREL) tablet 100 mg  100 mg Oral QHS PRN Clovis Fredrickson, MD        Lab Results:  Results for orders placed or performed during the hospital encounter of 08/18/16 (from the past 48 hour(s))  Comprehensive metabolic panel     Status: Abnormal   Collection Time: 08/19/16 12:54 AM  Result Value Ref Range   Sodium 137 135 - 145 mmol/L   Potassium 3.0 (L) 3.5 - 5.1 mmol/L   Chloride 108 101 - 111 mmol/L   CO2 24 22 - 32 mmol/L   Glucose, Bld 103 (H) 65 - 99 mg/dL   BUN 15 6 - 20 mg/dL   Creatinine, Ser 0.65 0.44 - 1.00 mg/dL   Calcium 9.4 8.9 - 10.3 mg/dL   Total Protein 8.0 6.5 - 8.1 g/dL   Albumin 4.8 3.5 - 5.0 g/dL   AST 26 15 - 41 U/L   ALT 20 14 - 54 U/L   Alkaline Phosphatase 65 38 - 126 U/L   Total Bilirubin 1.2 0.3 - 1.2 mg/dL   GFR calc non Af Amer >60 >60 mL/min   GFR calc Af Amer >60 >60 mL/min    Comment: (NOTE) The eGFR has been calculated using the CKD EPI equation. This calculation has not been validated in all clinical situations. eGFR's persistently <60 mL/min signify possible Chronic  Kidney Disease.    Anion gap 5 5 - 15  CBC with Differential/Platelet     Status: None   Collection Time: 08/19/16 12:54 AM  Result Value Ref Range   WBC 7.1 4.0 - 10.5 K/uL   RBC  4.17 3.87 - 5.11 MIL/uL   Hemoglobin 12.5 12.0 - 15.0 g/dL   HCT 36.0 36.0 - 46.0 %   MCV 86.3 78.0 - 100.0 fL   MCH 30.0 26.0 - 34.0 pg   MCHC 34.7 30.0 - 36.0 g/dL   RDW 12.7 11.5 - 15.5 %   Platelets 242 150 - 400 K/uL   Neutrophils Relative % 63 %   Neutro Abs 4.5 1.7 - 7.7 K/uL   Lymphocytes Relative 31 %   Lymphs Abs 2.2 0.7 - 4.0 K/uL   Monocytes Relative 5 %   Monocytes Absolute 0.4 0.1 - 1.0 K/uL   Eosinophils Relative 1 %   Eosinophils Absolute 0.1 0.0 - 0.7 K/uL   Basophils Relative 0 %   Basophils Absolute 0.0 0.0 - 0.1 K/uL  Lipase, blood     Status: None   Collection Time: 08/19/16 12:54 AM  Result Value Ref Range   Lipase 31 11 - 51 U/L  Ethanol     Status: None   Collection Time: 08/19/16 12:54 AM  Result Value Ref Range   Alcohol, Ethyl (B) <5 <5 mg/dL    Comment:        LOWEST DETECTABLE LIMIT FOR SERUM ALCOHOL IS 5 mg/dL FOR MEDICAL PURPOSES ONLY   Acetaminophen level     Status: None   Collection Time: 08/19/16 12:54 AM  Result Value Ref Range   Acetaminophen (Tylenol), Serum 11 10 - 30 ug/mL    Comment:        THERAPEUTIC CONCENTRATIONS VARY SIGNIFICANTLY. A RANGE OF 10-30 ug/mL MAY BE AN EFFECTIVE CONCENTRATION FOR MANY PATIENTS. HOWEVER, SOME ARE BEST TREATED AT CONCENTRATIONS OUTSIDE THIS RANGE. ACETAMINOPHEN CONCENTRATIONS >150 ug/mL AT 4 HOURS AFTER INGESTION AND >50 ug/mL AT 12 HOURS AFTER INGESTION ARE OFTEN ASSOCIATED WITH TOXIC REACTIONS.   Salicylate level     Status: None   Collection Time: 08/19/16 12:54 AM  Result Value Ref Range   Salicylate Lvl <0.4 2.8 - 30.0 mg/dL  I-Stat Beta hCG blood, ED ([facility], [facility], [facility] only)     Status: None   Collection Time: 08/19/16  1:08 AM  Result Value Ref Range   I-stat hCG, quantitative <5.0 <5 mIU/mL   Comment 3             Comment:   GEST. AGE      CONC.  (mIU/mL)   <=1 WEEK        5 - 50     2 WEEKS       50 - 500     3 WEEKS       100 - 10,000     4 WEEKS     1,000 - 30,000        FEMALE AND NON-PREGNANT FEMALE:     LESS THAN 5 mIU/mL   Rapid urine drug screen (hospital performed)     Status: None   Collection Time: 08/19/16  2:01 AM  Result Value Ref Range   Opiates NONE DETECTED NONE DETECTED   Cocaine NONE DETECTED NONE DETECTED   Benzodiazepines NONE DETECTED NONE DETECTED   Amphetamines NONE DETECTED NONE DETECTED   Tetrahydrocannabinol NONE DETECTED NONE DETECTED   Barbiturates NONE DETECTED NONE DETECTED    Comment:        DRUG SCREEN FOR MEDICAL PURPOSES ONLY.  IF CONFIRMATION IS NEEDED FOR ANY PURPOSE, NOTIFY LAB WITHIN 5 DAYS.        LOWEST DETECTABLE LIMITS FOR URINE DRUG SCREEN Drug Class  Cutoff (ng/mL) Amphetamine      1000 Barbiturate      200 Benzodiazepine   935 Tricyclics       701 Opiates          300 Cocaine          300 THC              50   Urinalysis, Routine w reflex microscopic     Status: Abnormal   Collection Time: 08/19/16  2:06 AM  Result Value Ref Range   Color, Urine YELLOW YELLOW   APPearance TURBID (A) CLEAR   Specific Gravity, Urine 1.030 1.005 - 1.030   pH 5.0 5.0 - 8.0   Glucose, UA NEGATIVE NEGATIVE mg/dL   Hgb urine dipstick NEGATIVE NEGATIVE   Bilirubin Urine NEGATIVE NEGATIVE   Ketones, ur 80 (A) NEGATIVE mg/dL   Protein, ur 100 (A) NEGATIVE mg/dL   Nitrite NEGATIVE NEGATIVE   Leukocytes, UA LARGE (A) NEGATIVE   RBC / HPF NONE SEEN 0 - 5 RBC/hpf   WBC, UA TOO NUMEROUS TO COUNT 0 - 5 WBC/hpf   Bacteria, UA MANY (A) NONE SEEN   Squamous Epithelial / LPF 0-5 (A) NONE SEEN   Mucous PRESENT   Acetaminophen level     Status: Abnormal   Collection Time: 08/19/16  3:37 AM  Result Value Ref Range   Acetaminophen (Tylenol), Serum <10 (L) 10 - 30 ug/mL    Comment:        THERAPEUTIC CONCENTRATIONS VARY SIGNIFICANTLY. A RANGE OF  10-30 ug/mL MAY BE AN EFFECTIVE CONCENTRATION FOR MANY PATIENTS. HOWEVER, SOME ARE BEST TREATED AT CONCENTRATIONS OUTSIDE THIS RANGE. ACETAMINOPHEN CONCENTRATIONS >150 ug/mL AT 4 HOURS AFTER INGESTION AND >50 ug/mL AT 12 HOURS AFTER INGESTION ARE OFTEN ASSOCIATED WITH TOXIC REACTIONS.   Salicylate level     Status: None   Collection Time: 08/19/16  3:37 AM  Result Value Ref Range   Salicylate Lvl <7.7 2.8 - 30.0 mg/dL    Blood Alcohol level:  Lab Results  Component Value Date   ETH <5 08/19/2016   ETH <5 93/90/3009    Metabolic Disorder Labs: No results found for: HGBA1C, MPG Lab Results  Component Value Date   PROLACTIN 6.4 11/18/2014   PROLACTIN  11/14/2010    31.2 (NOTE)     Reference Ranges:                 Female:                       2.1 -  17.1 ng/ml                 Female:   Pregnant          9.7 - 208.5 ng/mL                           Non Pregnant      2.8 -  29.2 ng/mL                           Post  Menopausal   1.8 -  20.3 ng/mL                     No results found for: CHOL, TRIG, HDL, CHOLHDL, VLDL, LDLCALC  Physical Findings: AIMS:  , ,  ,  ,    CIWA:  COWS:     Musculoskeletal: Strength & Muscle Tone: within normal limits Gait & Station: normal Patient leans: N/A  Psychiatric Specialty Exam: Physical Exam  Nursing note and vitals reviewed.   Review of Systems  Genitourinary: Positive for dysuria and urgency.  Psychiatric/Behavioral: Positive for depression and suicidal ideas. The patient is nervous/anxious.   All other systems reviewed and are negative.   Blood pressure 109/61, pulse 71, temperature 98.5 F (36.9 C), temperature source Oral, resp. rate 16, height 5' (1.524 m), weight 46.7 kg (103 lb), SpO2 100 %.Body mass index is 20.12 kg/m.  General Appearance: Casual  Eye Contact:  Minimal  Speech:  Slow  Volume:  Decreased  Mood:  Depressed  Affect:  Blunt  Thought Process:  Goal Directed and Descriptions of Associations: Intact   Orientation:  Full (Time, Place, and Person)  Thought Content:  WDL  Suicidal Thoughts:  No  Homicidal Thoughts:  No  Memory:  Immediate;   Fair Recent;   Fair Remote;   Fair  Judgement:  Impaired  Insight:  Shallow  Psychomotor Activity:  Psychomotor Retardation  Concentration:  Concentration: Fair and Attention Span: Fair  Recall:  [employer] of Knowledge:  Fair  Language:  Fair  Akathisia:  No  Handed:  Right  AIMS (if indicated):     Assets:  Communication Skills Desire for Improvement Financial Resources/Insurance Housing Intimacy Physical Health Resilience Social Support Talents/Skills Transportation Vocational/Educational  ADL's:  Intact  Cognition:  WNL  Sleep:  Number of Hours: 8.5     Treatment Plan Summary: Daily contact with patient to assess and evaluate symptoms and progress in treatment and Medication management   Ms. Bosques is a 21 year old female with a history of depression, anxiety, and mood is needed after suicide attempt by medication overdose in the context of social stressors.  1. Suicidal ideation. The patient is able to contract for safety in the hospital.   2. Mood. She has been maintained on a combination of Remeron and low-dose Depakote in the community. She has not been compliant with medications. We will restart Remeron and start Lamictal to stabilize her mood.  3. UTI. We ordered urine culture and gave her a dose of fosfomycin.  4. Vaginosis. We will start metronidazole.  5. Anxiety. Hydroxyzine is available.  6. Insomnia. Trazodone is available.  7. Disposition. She will be discharged back to her apartment. She will follow up with [facility] [place].  Orson Slick, MD 08/20/2016, 11:02 AM

## 2016-08-20 NOTE — Progress Notes (Signed)
NUTRITION ASSESSMENT  Pt identified as at risk on the Malnutrition Screen Tool  INTERVENTION: 1. Educated patient on the importance of nutrition and encouraged intake of food and beverages. 2. Discussed weight goals. 3. Supplements: Ensure Enlive po BID, each supplement provides 350 kcal and 20 grams of protein  NUTRITION DIAGNOSIS: Unintentional weight loss related to sub-optimal intake as evidenced by pt report.   Goal: Pt to meet >/= 90% of their estimated nutrition needs.  Monitor:  PO intake  Assessment:   21 y.o. female who presents with depression, anxiety, medication overdose and found to have UTI/vaginosis.  Spoke with patient in her room. Patient reports she has a poor appetite. She reports eating only 1 meal per day lately because she is not experiencing hunger (anorexia). RD discussed importance of adequate calories and protein to prevent weight loss and malnutrition. Patient does not want to eat more meals or eat the snacks offered on unit, but she is amenable to drinking Ensure.   Patient is unsure if she has been losing weight or not, but had reported she has had unintentional weight loss on malnutrition screening tool.  Height: Ht Readings from Last 1 Encounters:  08/19/16 5' (1.524 m)    Weight: Wt Readings from Last 1 Encounters:  08/19/16 103 lb (46.7 kg)    Weight Hx: Wt Readings from Last 10 Encounters:  08/19/16 103 lb (46.7 kg)  08/19/16 100 lb 6.4 oz (45.5 kg)  03/13/16 100 lb (45.4 kg)  12/07/15 90 lb (40.8 kg)  12/06/15 110 lb (49.9 kg)  11/18/14 91 lb (41.3 kg)  11/04/14 93 lb 4.8 oz (42.3 kg)  10/27/14 91 lb 9.6 oz (41.5 kg) (<1 %, Z < -2.33)*  09/28/14 92 lb (41.7 kg) (<1 %, Z < -2.33)*  06/06/14 96 lb (43.5 kg) (1 %, Z= -2.18)*   * Growth percentiles are based on CDC 2-20 Years data.    BMI:  Body mass index is 20.12 kg/m. Pt meets criteria for Normal Weight based on current BMI.  Estimated Nutritional Needs: Kcal: 25-30  kcal/kg Protein: > 1 gram protein/kg Fluid: 1 ml/kcal  Diet Order: Diet Heart Room service appropriate? Yes; Fluid consistency: Thin Pt is also offered choice of unit snacks mid-morning and mid-afternoon.  Pt is eating as desired.   Lab results and medications reviewed.   Helane RimaLeanne Kierston Plasencia, MS, RD, LDN Pager: [phone] After Hours Pager: [phone]

## 2016-08-20 NOTE — Plan of Care (Signed)
Problem: Education: Goal: Emotional status will improve Outcome: Not Progressing Pt still with sad flat affect. Forwards little information

## 2016-08-20 NOTE — Tx Team (Signed)
Interdisciplinary Treatment and Diagnostic Plan Update  08/20/2016 Time of Session: [address] MRN: [id number]  Principal Diagnosis: Major depressive disorder, recurrent severe without psychotic features ([place])  Secondary Diagnoses: Principal Problem:   Major depressive disorder, recurrent severe without psychotic features ([place]) Active Problems:   Suicide attempt by drug ingestion ([place])   UTI (urinary tract infection)   Current Medications:  Current Facility-Administered Medications  Medication Dose Route Frequency Provider Last Rate Last Dose  . acetaminophen (TYLENOL) tablet 650 mg  650 mg Oral Q6H PRN Patrecia Pour, NP      . alum & mag hydroxide-simeth (MAALOX/MYLANTA) 200-200-20 MG/5ML suspension 30 mL  30 mL Oral Q4H PRN Patrecia Pour, NP      . docusate sodium (COLACE) capsule 100 mg  100 mg Oral BID Patrecia Pour, NP   100 mg at 08/20/16 0240  . hydrOXYzine (ATARAX/VISTARIL) tablet 25 mg  25 mg Oral TID PRN Clovis Fredrickson, MD      . ibuprofen (ADVIL,MOTRIN) tablet 600 mg  600 mg Oral Q8H PRN Patrecia Pour, NP   600 mg at 08/19/16 1257  . lamoTRIgine (LAMICTAL) tablet 25 mg  25 mg Oral QHS Clovis Fredrickson, MD   25 mg at 08/19/16 2224  . magnesium hydroxide (MILK OF MAGNESIA) suspension 30 mL  30 mL Oral Daily PRN Patrecia Pour, NP      . metroNIDAZOLE (FLAGYL) tablet 500 mg  500 mg Oral Q12H Jolanta B Pucilowska, MD   500 mg at 08/20/16 0836  . mirtazapine (REMERON) tablet 15 mg  15 mg Oral QHS Clovis Fredrickson, MD   15 mg at 08/19/16 2224  . ondansetron (ZOFRAN) tablet 4 mg  4 mg Oral Q8H PRN Patrecia Pour, NP      . traZODone (DESYREL) tablet 100 mg  100 mg Oral QHS PRN Clovis Fredrickson, MD       PTA Medications: Prescriptions Prior to Admission  Medication Sig Dispense Refill Last Dose  . cyclobenzaprine (FLEXERIL) 10 MG tablet Take 10 mg by mouth 3 (three) times daily as needed for muscle spasms.   08/18/2016 at Unknown time  .  DM-Doxylamine-Acetaminophen (NYQUIL COLD & FLU PO) Take 1 capsule by mouth at bedtime as needed.   Past Month at Unknown time  . hydrOXYzine (ATARAX/VISTARIL) 25 MG tablet Take 1 tablet (25 mg total) by mouth every 6 (six) hours as needed (anxiety/agitation or CIWA < or = 10). (Patient not taking: Reported on 08/02/2016) 30 tablet 0 Not Taking at Unknown time  . ibuprofen (ADVIL,MOTRIN) 600 MG tablet Take 1 tablet (600 mg total) by mouth every 6 (six) hours as needed for moderate pain. (Patient not taking: Reported on 08/02/2016) 30 tablet 0 Not Taking at Unknown time  . indomethacin (INDOCIN) 25 MG capsule Take 25 mg by mouth 3 (three) times daily as needed for mild pain or moderate pain.    unk  . levonorgestrel (MIRENA) 20 MCG/24HR IUD 1 each by Intrauterine route once. Placed in 09/2015   cont  . loratadine (CLARITIN) 10 MG tablet Take 1 tablet (10 mg total) by mouth daily. (Patient not taking: Reported on 08/02/2016) 30 tablet 0 Not Taking at Unknown time  . mirtazapine (REMERON) 15 MG tablet Take 1 tablet (15 mg total) by mouth at bedtime. 30 tablet 0 Past Week at Unknown time  . ondansetron (ZOFRAN) 8 MG tablet Take 1 tablet (8 mg total) by mouth every 8 (eight) hours as needed  for nausea or vomiting. (Patient not taking: Reported on 08/19/2016) 6 tablet 0 Completed Course at Unknown time    Patient Stressors: Medication change or noncompliance Traumatic event  Patient Strengths: Average or above average intelligence Capable of independent living Communication skills Work skills  Treatment Modalities: Medication Management, Group therapy, Case management,  1 to 1 session with clinician, Psychoeducation, Recreational therapy.   Physician Treatment Plan for Primary Diagnosis: Major depressive disorder, recurrent severe without psychotic features ([place]) Long Term Goal(s): Improvement in symptoms so as ready for discharge NA   Short Term Goals: Ability to identify changes in lifestyle to  reduce recurrence of condition will improve Ability to verbalize feelings will improve Ability to disclose and discuss suicidal ideas Ability to demonstrate self-control will improve Ability to identify and develop effective coping behaviors will improve Ability to maintain clinical measurements within normal limits will improve Compliance with prescribed medications will improve Ability to identify triggers associated with substance abuse/mental health issues will improve NA  Medication Management: Evaluate patient's response, side effects, and tolerance of medication regimen.  Therapeutic Interventions: 1 to 1 sessions, Unit Group sessions and Medication administration.  Evaluation of Outcomes: Not Met  Physician Treatment Plan for Secondary Diagnosis: Principal Problem:   Major depressive disorder, recurrent severe without psychotic features ([place]) Active Problems:   Suicide attempt by drug ingestion ([place])   UTI (urinary tract infection)  Long Term Goal(s): Improvement in symptoms so as ready for discharge NA   Short Term Goals: Ability to identify changes in lifestyle to reduce recurrence of condition will improve Ability to verbalize feelings will improve Ability to disclose and discuss suicidal ideas Ability to demonstrate self-control will improve Ability to identify and develop effective coping behaviors will improve Ability to maintain clinical measurements within normal limits will improve Compliance with prescribed medications will improve Ability to identify triggers associated with substance abuse/mental health issues will improve NA     Medication Management: Evaluate patient's response, side effects, and tolerance of medication regimen.  Therapeutic Interventions: 1 to 1 sessions, Unit Group sessions and Medication administration.  Evaluation of Outcomes: Not Met   RN Treatment Plan for Primary Diagnosis: Major depressive disorder, recurrent severe without  psychotic features ([place]) Long Term Goal(s): Knowledge of disease and therapeutic regimen to maintain health will improve  Short Term Goals: Ability to participate in decision making will improve, Ability to verbalize feelings will improve, Ability to identify and develop effective coping behaviors will improve and Compliance with prescribed medications will improve  Medication Management: RN will administer medications as ordered by provider, will assess and evaluate patient's response and provide education to patient for prescribed medication. RN will report any adverse and/or side effects to prescribing provider.  Therapeutic Interventions: 1 on 1 counseling sessions, Psychoeducation, Medication administration, Evaluate responses to treatment, Monitor vital signs and CBGs as ordered, Perform/monitor CIWA, COWS, AIMS and Fall Risk screenings as ordered, Perform wound care treatments as ordered.  Evaluation of Outcomes: Not Met   LCSW Treatment Plan for Primary Diagnosis: Major depressive disorder, recurrent severe without psychotic features ([place]) Long Term Goal(s): Safe transition to appropriate next level of care at discharge, Engage patient in therapeutic group addressing interpersonal concerns.  Short Term Goals: Engage patient in aftercare planning with referrals and resources, Increase social support, Increase ability to appropriately verbalize feelings, Increase emotional regulation and Increase skills for wellness and recovery  Therapeutic Interventions: Assess for all discharge needs, 1 to 1 time with Social worker, Explore available resources and support systems,  Assess for adequacy in community support network, Educate family and significant other(s) on suicide prevention, Complete Psychosocial Assessment, Interpersonal group therapy.  Evaluation of Outcomes: Not Met   Progress in Treatment: Attending groups: No. Just admitted. Participating in groups: No. Taking medication as  prescribed: Yes. Toleration medication: Yes. Family/Significant other contact made: No, will contact:  once a release is completed Patient understands diagnosis: Yes. Discussing patient identified problems/goals with staff: Yes. Medical problems stabilized or resolved: Yes. Denies suicidal/homicidal ideation: Yes. Issues/concerns per patient self-inventory: Yes. Other: none  New problem(s) identified: No, Describe:  none  New Short Term/Long Term Goal(s): none   Discharge Plan or Barriers: CSW assessing for appropriate discharge plan.  Reason for Continuation of Hospitalization: Depression Suicidal ideation  Estimated Length of Stay: 3-5 days  Attendees: Patient: Leah [name] 08/20/2016   Physician: Dr. Orson Slick, MD 08/20/2016   Nursing: Carolynn Sayers, RN 08/20/2016   RN Care Manager: 08/20/2016   Social Worker: Jen Mow. Satira Sark 08/20/2016   Recreational Therapist: Leonette Monarch, LRT/CTRS 08/20/2016   Other:  08/20/2016   Other:  08/20/2016   Other: 08/20/2016         Scribe for Treatment Team: Joanne Chars, LCSW 08/20/2016 11:12 AM

## 2016-08-21 LAB — URINE CULTURE

## 2016-08-21 MED ORDER — ARIPIPRAZOLE 2 MG PO TABS
2.0000 mg | ORAL_TABLET | Freq: Every day | ORAL | Status: DC
  Administered 2016-08-21 – 2016-08-22 (×2): 2 mg via ORAL
  Filled 2016-08-21 (×2): qty 1

## 2016-08-21 NOTE — Progress Notes (Signed)
D: Patient  Voice of her goal today " Not to let her thoughts get the best of her" Voice of pelvic  Pain but  When spoke to patient , she shared nothing of this . Writer will again offer pain medication. Also voice of she was thinking  Positive.    Patient stated slept good last night .Stated appetite isfairand energy level low. Stated concentration is good . Stated on Depression scale , hopeless and anxiety .( low 0-10 high) Denies suicidal  homicidal ideations  .  No auditory hallucinations  No pain concerns . Appropriate ADL'S. Interacting with peers and staff.  A: Encourage patient participation with unit programming . Instruction  Given on  Medication , verbalize understanding. R: Voice no other concerns. Staff continue to monitor

## 2016-08-21 NOTE — Plan of Care (Signed)
Problem: Coping: Goal: Ability to verbalize frustrations and anger appropriately will improve Outcome: Not Applicable Date Met: 88/82/80 Pt remains isolative, guarded and withdrawn to self. Patient will verbalized feelings related to emotional state.
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
## 2016-08-21 NOTE — B[facility] Group Notes (Signed)
[facility] Group Notes:  (Nursing/MHT/Case Management/Adjunct)  Date:  08/21/2016  Time:  1:36 AM  Type of Therapy:  Group Therapy  Participation Level:  Did Not Attend    Leah [name] 08/21/2016, 1:36 AM 
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
## 2016-08-21 NOTE — [facility] Group Notes (Signed)
[facility] Group Notes:  (Nursing/MHT/Case Management/Adjunct)  Date:  08/21/2016  Time:  3:54 PM  Type of Therapy:  Psychoeducational Skills  Participation Level:  Did Not Attend   [name] 08/21/2016, 3:54 PM

## 2016-08-21 NOTE — Plan of Care (Signed)
Problem: Coping: Goal: Ability to cope will improve Outcome: Progressing Working on coping skills   

## 2016-08-21 NOTE — Plan of Care (Signed)
Problem: [facility] Participation in Recreation Therapeutic Interventions Goal: STG-Patient will identify at least five coping skills for ** STG: Coping Skills - Within 4 treatment sessions, patient will verbalize at least 5 coping skills for anger in each of 2 treatment sessions to increase anger management skills post d/c.  Outcome: Completed/Met Date Met: 08/21/16 Treatment Session 2; Completed 2 out of 2: At approximately 2:55 pm, LRT met with patient in patient room. Patient verbalized 5 coping skills for anger. LRT encouraged patient to use her coping skills to keep her calm when she gets angry.  Leonette Monarch, LRT/CTRS 01.31.18 3:20 pm Goal: STG-Other Recreation Therapy Goal (Specify) STG: Stress Management - Within 4 treatment sessions, patient will verbalize understanding of the stress management techniques in each of 2 treatment sessions to increase stress management skills post d/c.  Outcome: Completed/Met Date Met: 08/21/16 Treatment Session 2; Completed 2 out of 2: At approximately 2:55 pm, LRT met with patient in patient room. Patient reported she read over the stress management techniques. Patient verbalized understanding. LRT encouraged patient to practice the stress management techniques.  Leonette Monarch, LRT/CTRS 01.31.18 3:31 pm

## 2016-08-21 NOTE — Plan of Care (Signed)
Problem: Coping: Goal: Ability to verbalize feelings will improve Outcome: Progressing Patient is verbalizing feelings to staff.    

## 2016-08-21 NOTE — Progress Notes (Signed)
Patient remains isolative and guarded.did not attend group. ABT therapy maintained for UTI. No adverse reaction noted. Speech soft. Poor eye contact. No voiced thoughts of hurting herself. Will continue to monitor for safety and behavior.

## 2016-08-21 NOTE — Progress Notes (Signed)
D: Pt denies SI/HI/AVH. Pt is pleasant and cooperative, affect is bright, appears euphoric. Patient thought process is logical no bizarre behavior noted during shift.  Pt appears less anxious and she is interacting with peers and staff appropriately.  A: Pt was offered support and encouragement. Pt was given scheduled medications. Pt was encouraged to attend groups. Q 15 minute checks were done for safety.  R:Pt attends groups and interacts well with peers and staff. Pt is compliant with medication. Pt has no complaints.Pt receptive to treatment and safety maintained on unit.
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
## 2016-08-21 NOTE — Progress Notes (Signed)
[facility] MD Progress Note  08/21/2016 11:26 AM ADISON REIFSTECK  MRN:  [id number]  Subjective:  08/20/2016.  Ms. Kasparek met with the treatment team today. She appears very depressed with severe psychomotor retardation and flat affect. She barely opens her eyes. She speaks in a whisper. She accepts medications and tolerates them well, including antibiotics. She still has symptoms of urinary tract infection. Urine culture is pending. She does not participate in programming.  08/21/2017. Ms. Bertram appears very depressed. She is in bed asleep difficult to awake. She gives only yes and no answers and the latency of response is increased. She says that she feels good and wants to go home but there is no evidence of improvement so far. She has not been going to groups.  Per Nursing: Patient isolative to self and room. Forwards little. Denies SI, HI, AVH. Endorses sadness. Pt out for meals. Did not want to take Flagl, reports it makes her body sick.  Encouragement and support offered. Medication given as prescribed. Safety checks maintained.  Pt receptive. Remains isolative to self and room. Patient signed 72 hour request for discharge.  Remains safe on unit with q 15 min checks.  Principal Problem: Major depressive disorder, recurrent severe without psychotic features ([place]) Diagnosis:   Patient Active Problem List   Diagnosis Date Noted  . Major depressive disorder, recurrent severe without psychotic features ([place]) [F33.2] 08/19/2016  . UTI (urinary tract infection) [N39.0] 08/19/2016  . Suicide attempt by drug ingestion ([place]) [T50.902A]   . Contraception [Z30.9] 03/10/2014  . Biliary colic [R41.63] 84/53/6468  . BV (bacterial vaginosis) [N76.0, B96.89] 03/10/2014  . Depression [F32.9] 03/16/2013  . Allergic rhinitis, seasonal [J30.2] 01/26/2013  . Anemia, iron deficiency [D50.9] 12/01/2012   Total Time spent with patient: 20 minutes  Past Psychiatric History: Depression and anxiety.  Past Medical  History:  Past Medical History:  Diagnosis Date  . Anemia 2013  . Anxiety 2011   no meds during pregnancy  . Bipolar 1 disorder ([place])   . Chlamydia   . Gall stones   . GERD (gastroesophageal reflux disease)   . 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 Seizure [facility]) 2011   pseudoseizures    Past Surgical History:  Procedure Laterality Date  . CHOLECYSTECTOMY N/A 09/28/2014   Procedure: LAPAROSCOPIC CHOLECYSTECTOMY;  Surgeon: Ralene Ok, MD;  Location: [place];  Service: General;  Laterality: N/A;  . CHOLECYSTECTOMY    . DIRECT LARYNGOSCOPY N/A 04/24/2014   Procedure: DIRECT LARYNGOSCOPY;  Surgeon: Ascencion Dike, MD;  Location: [place];  Service: ENT;  Laterality: N/A;  . FOREIGN BODY REMOVAL ESOPHAGEAL N/A 04/24/2014   Procedure: REMOVAL FOREIGN BODY ESOPHAGEAL;  Surgeon: Ascencion Dike, MD;  Location: [facility];  Service: ENT;  Laterality: N/A;  . WISDOM TOOTH EXTRACTION  2013   Family History:  Family History  Problem Relation Age of Onset  . Arthritis Mother   . Bronchitis Mother   . Asthma Mother   . Hearing loss Paternal Grandfather   . Cancer Neg Hx   . Diabetes Neg Hx    Family Psychiatric  History: None reported. Social History:  History  Alcohol Use No     History  Drug Use No    Social History   Social History  . Marital status: Single    Spouse name: N/A  . Number of children: 1  . Years of education: 62 th    Occupational History  .  [place]   Social History Main Topics  . Smoking status: Never Smoker  . Smokeless tobacco: Never Used  . Alcohol use No  . Drug use: No  . Sexual activity: Yes    Partners: Male    Birth control/ protection: IUD, None   Other Topics Concern  . None   Social History Narrative   ** Merged History Encounter **       Live with roommate and son.    Starting at [employer].    Additional Social History:    History of alcohol / drug use?: Yes                    Sleep:  Fair  Appetite:  Fair  Current Medications: Current Facility-Administered Medications  Medication Dose Route Frequency Provider Last Rate Last Dose  . acetaminophen (TYLENOL) tablet 650 mg  650 mg Oral Q6H PRN Patrecia Pour, NP      . alum & mag hydroxide-simeth (MAALOX/MYLANTA) 200-200-20 MG/5ML suspension 30 mL  30 mL Oral Q4H PRN Patrecia Pour, NP      . docusate sodium (COLACE) capsule 100 mg  100 mg Oral BID Patrecia Pour, NP   100 mg at 08/21/16 0835  . feeding supplement (ENSURE ENLIVE) (ENSURE ENLIVE) liquid 237 mL  237 mL Oral BID BM Tramon Crescenzo B Guillermo Difrancesco, MD   237 mL at 08/21/16 0834  . hydrOXYzine (ATARAX/VISTARIL) tablet 25 mg  25 mg Oral TID PRN Clovis Fredrickson, MD      . ibuprofen (ADVIL,MOTRIN) tablet 600 mg  600 mg Oral Q8H PRN Patrecia Pour, NP   600 mg at 08/19/16 1257  . lamoTRIgine (LAMICTAL) tablet 25 mg  25 mg Oral QHS Clovis Fredrickson, MD   25 mg at 08/20/16 2207  . magnesium hydroxide (MILK OF MAGNESIA) suspension 30 mL  30 mL Oral Daily PRN Patrecia Pour, NP      . metroNIDAZOLE (FLAGYL) tablet 500 mg  500 mg Oral Q12H Thayne Cindric B Galdino Hinchman, MD   500 mg at 08/21/16 0835  . mirtazapine (REMERON) tablet 15 mg  15 mg Oral QHS Clovis Fredrickson, MD   15 mg at 08/20/16 2207  . ondansetron (ZOFRAN) tablet 4 mg  4 mg Oral Q8H PRN Patrecia Pour, NP      . traZODone (DESYREL) tablet 100 mg  100 mg Oral QHS PRN Clovis Fredrickson, MD        Lab Results:  Results for orders placed or performed during the hospital encounter of 08/19/16 (from the past 48 hour(s))  Urine culture     Status: Abnormal   Collection Time: 08/19/16  6:03 PM  Result Value Ref Range   Specimen Description URINE, RANDOM    Special Requests NONE    Culture (A)     <10,000 COLONIES/mL INSIGNIFICANT GROWTH Performed at [facility], [address] [place], [place] [address]    Report Status 08/21/2016 FINAL     Blood Alcohol level:  Lab Results  Component Value Date    ETH <5 08/19/2016   ETH <5 09/01/1550    Metabolic Disorder Labs: No results found for: HGBA1C, MPG Lab Results  Component Value Date   PROLACTIN 6.4 11/18/2014   PROLACTIN  11/14/2010    31.2 (NOTE)     Reference Ranges:                 Female:  2.1 -  17.1 ng/ml                 Female:   Pregnant          9.7 - 208.5 ng/mL                           Non Pregnant      2.8 -  29.2 ng/mL                           Post  Menopausal   1.8 -  20.3 ng/mL                     No results found for: CHOL, TRIG, HDL, CHOLHDL, VLDL, LDLCALC  Physical Findings: AIMS:  , ,  ,  ,    CIWA:    COWS:     Musculoskeletal: Strength & Muscle Tone: within normal limits Gait & Station: normal Patient leans: N/A  Psychiatric Specialty Exam: Physical Exam  Nursing note and vitals reviewed.   Review of Systems  Genitourinary: Positive for dysuria and urgency.  Psychiatric/Behavioral: Positive for depression and suicidal ideas. The patient is nervous/anxious.   All other systems reviewed and are negative.   Blood pressure 105/67, pulse 61, temperature 98.7 F (37.1 C), temperature source Oral, resp. rate 18, height 5' (1.524 m), weight 46.7 kg (103 lb), SpO2 100 %.Body mass index is 20.12 kg/m.  General Appearance: Casual  Eye Contact:  Minimal  Speech:  Slow  Volume:  Decreased  Mood:  Depressed  Affect:  Blunt  Thought Process:  Goal Directed and Descriptions of Associations: Intact  Orientation:  Full (Time, Place, and Person)  Thought Content:  WDL  Suicidal Thoughts:  No  Homicidal Thoughts:  No  Memory:  Immediate;   Fair Recent;   Fair Remote;   Fair  Judgement:  Impaired  Insight:  Shallow  Psychomotor Activity:  Psychomotor Retardation  Concentration:  Concentration: Fair and Attention Span: Fair  Recall:  [employer] of Knowledge:  Fair  Language:  Fair  Akathisia:  No  Handed:  Right  AIMS (if indicated):     Assets:  Communication Skills Desire for  Improvement Financial Resources/Insurance Housing Intimacy Physical Health Resilience Social Support Talents/Skills Transportation Vocational/Educational  ADL's:  Intact  Cognition:  WNL  Sleep:  Number of Hours: 8     Treatment Plan Summary: Daily contact with patient to assess and evaluate symptoms and progress in treatment and Medication management   Ms. Leiner is a 21 year old female with a history of depression, anxiety, and mood is needed after suicide attempt by medication overdose in the context of social stressors.  1. Suicidal ideation. The patient is able to contract for safety in the hospital.   2. Mood. She has been maintained on a combination of Remeron and low-dose Depakote in the community. She has not been compliant with medications. We will restart Remeron and start Lamictal to stabilize her mood. I will add Abilify.  3. UTI. We ordered urine culture and gave her a dose of fosfomycin. Culture still pending. Will order UA.  4. Vaginosis. We will start metronidazole.  5. Anxiety. Hydroxyzine is available.  6. Insomnia. Trazodone is available.  7. Disposition. She will be discharged back to her apartment. She will follow up with [facility] [place].  Orson Slick, MD 08/21/2016, 11:26 AM

## 2016-08-21 NOTE — Progress Notes (Signed)
Recreation Therapy Notes  Date: 01.31.18 Time: 1:00 pm Location: Craft Room  Group Topic: Self-esteem  Goal Area(s) Addresses:  Patient will write at least one positive trait about self. Patient will verbalize benefit of having healthy self-esteem.  Behavioral Response: Attentive, Intermittently Interactive  Intervention: I Am  Activity: Patients were given worksheets with the letter I on it and were instructed to write as many positive traits inside the letter.  Education: LRT educated patients on ways to increase their self-esteem.  Education Outcome: In group clarification offered  Clinical Observations/Feedback: Patient wrote positive traits about self. Patient contributed to group discussion by stating a reason why a person might make a negative comment. Patient spend most the the time unbraiding her hair.  Jacquelynn CreeGreene,Shondale Quinley M, LRT/CTRS 08/21/2016 1:46 PM

## 2016-08-22 LAB — URINALYSIS, COMPLETE (UACMP) WITH MICROSCOPIC
BILIRUBIN URINE: NEGATIVE
Glucose, UA: NEGATIVE mg/dL
Ketones, ur: NEGATIVE mg/dL
NITRITE: NEGATIVE
PH: 6 (ref 5.0–8.0)
Protein, ur: NEGATIVE mg/dL
SPECIFIC GRAVITY, URINE: 1.019 (ref 1.005–1.030)

## 2016-08-22 LAB — TSH: TSH: 1.008 u[IU]/mL (ref 0.350–4.500)

## 2016-08-22 LAB — LIPID PANEL
Cholesterol: 129 mg/dL (ref 0–200)
HDL: 40 mg/dL — ABNORMAL LOW (ref >40)
LDL CALC: 82 mg/dL (ref 0–99)
TRIGLYCERIDES: 34 mg/dL (ref <150)
Total CHOL/HDL Ratio: 3.2 RATIO
VLDL: 7 mg/dL (ref 0–40)

## 2016-08-22 MED ORDER — PROMETHAZINE HCL 25 MG PO TABS: 25.0000 mg | ORAL_TABLET | Freq: Four times a day (QID) | ORAL | Status: DC | PRN

## 2016-08-22 MED ORDER — MIRTAZAPINE 15 MG PO TABS: 15.0000 mg | ORAL_TABLET | Freq: Every day | ORAL | 1 refills | Status: DC

## 2016-08-22 MED ORDER — SULFAMETHOXAZOLE-TRIMETHOPRIM 400-80 MG PO TABS
1.0000 | ORAL_TABLET | Freq: Two times a day (BID) | ORAL | Status: DC
  Administered 2016-08-22 – 2016-08-23 (×2): 1 via ORAL
  Filled 2016-08-22 (×3): qty 1

## 2016-08-22 MED ORDER — RISPERIDONE 1 MG PO TABS
2.0000 mg | ORAL_TABLET | Freq: Every day | ORAL | Status: DC
  Administered 2016-08-22: 2 mg via ORAL
  Filled 2016-08-22: qty 2

## 2016-08-22 MED ORDER — RISPERIDONE 2 MG PO TABS: 2.0000 mg | ORAL_TABLET | Freq: Every day | ORAL | 1 refills | Status: DC

## 2016-08-22 MED ORDER — LAMOTRIGINE 25 MG PO TABS: 25.0000 mg | ORAL_TABLET | Freq: Every day | ORAL | 1 refills | Status: DC

## 2016-08-22 MED ORDER — IBUPROFEN 600 MG PO TABS
600.0000 mg | ORAL_TABLET | Freq: Four times a day (QID) | ORAL | Status: DC | PRN
  Administered 2016-08-22: 600 mg via ORAL

## 2016-08-22 NOTE — Progress Notes (Signed)
Affect cheerful this shift . Attended Treatment  Team this shift  Aware of plan of care . Goal today  Think positive . Patient stated slept poor last night .Stated appetite is fair and energy low normal. Stated concentration is good . Stated on Depression scale 0 , hopeless 0 and anxiety 2 .( low 0-10 high) Denies suicidal  homicidal ideations  .  No auditory hallucinations  No pain concerns . Appropriate ADL'S. Interacting with peers and staff.  A: Encourage patient participation with unit programming . Instruction  Given on  Medication , verbalize understanding. R: Voice no other concerns. Staff continue to monitor

## 2016-08-22 NOTE — [facility] Group Notes (Signed)
[facility] Group Notes:  (Nursing/MHT/Case Management/Adjunct)  Date:  08/22/2016  Time:  9:31 PM  Type of Therapy:  Evening Wrap-up Group  Participation Level:  Active  Participation Quality:  Appropriate and Attentive  Affect:  Appropriate  Cognitive:  Alert and Appropriate  Insight:  Appropriate and Good  Engagement in Group:  Developing/Improving and Engaged  Modes of Intervention:  Discussion  Summary of Progress/Problems:  Tomasita MorrowChelsea Nanta Marisel Tostenson 08/22/2016, 9:31 PM

## 2016-08-22 NOTE — [facility] Group Notes (Signed)
[facility] Group Notes:  (Nursing/MHT/Case Management/Adjunct)  Date:  08/22/2016  Time:  12:41 AM  Type of Therapy:  Psychoeducational Skills  Participation Level:  Active  Participation Quality:  Appropriate, Attentive and Sharing  Affect:  Appropriate  Cognitive:  Appropriate  Insight:  Appropriate  Engagement in Group:  Engaged  Modes of Intervention:  Discussion, Socialization and Support  Summary of Progress/Problems:  Chancy MilroyLaquanda Y Sherrin Stahle 08/22/2016, 12:41 AM

## 2016-08-22 NOTE — Progress Notes (Signed)
[facility] MD Progress Note  08/22/2016 2:11 PM MYLANI GENTRY  MRN:  [id number]  Subjective:  08/20/2016.  Ms. Nutter met with the treatment team today. She appears very depressed with severe psychomotor retardation and flat affect. She barely opens her eyes. She speaks in a whisper. She accepts medications and tolerates them well, including antibiotics. She still has symptoms of urinary tract infection. Urine culture is pending. She does not participate in programming.  08/21/2016. Ms. Dardis appears very depressed. She is in bed asleep difficult to awake. She gives only yes and no answers and the latency of response is increased. She says that she feels good and wants to go home but there is no evidence of improvement so far. She has not been going to groups.  08/22/2016. Ms. Henthorn met with the treatment team today. She is much more awake, aware and interactive. She adamantly denies any psychotic symptoms, suicidal or homicidal ideation. She complains of headache, nausea, and nightmares. She started participating in programming. She signed 72 hour form. Anticipated discharge tomorrow.  Per Nursing: D: Pt denies SI/HI/AVH. Pt is pleasant and cooperative, affect is bright, appears euphoric. Patient thought process is logical no bizarre behavior noted during shift.  Pt appears less anxious and she is interacting with peers and staff appropriately.  A: Pt was offered support and encouragement. Pt was given scheduled medications. Pt was encouraged to attend groups. Q 15 minute checks were done for safety.  R:Pt attends groups and interacts well with peers and staff. Pt is compliant with medication. Pt has no complaints.Pt receptive to treatment and safety maintained on unit.  Principal Problem: Major depressive disorder, recurrent severe without psychotic features ([place]) Diagnosis:   Patient Active Problem List   Diagnosis Date Noted  . Major depressive disorder, recurrent severe without psychotic features ([place])  [F33.2] 08/19/2016  . UTI (urinary tract infection) [N39.0] 08/19/2016  . Suicide attempt by drug ingestion ([place]) [T50.902A]   . Contraception [Z30.9] 03/10/2014  . Biliary colic [O11.57] 26/20/3559  . BV (bacterial vaginosis) [N76.0, B96.89] 03/10/2014  . Depression [F32.9] 03/16/2013  . Allergic rhinitis, seasonal [J30.2] 01/26/2013  . Anemia, iron deficiency [D50.9] 12/01/2012   Total Time spent with patient: 20 minutes  Past Psychiatric History: Depression and anxiety.  Past Medical History:  Past Medical History:  Diagnosis Date  . Anemia 2013  . Anxiety 2011   no meds during pregnancy  . Bipolar 1 disorder ([place])   . Chlamydia   . Gall stones   . GERD (gastroesophageal reflux disease)   . 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 Seizure [facility]) 2011   pseudoseizures    Past Surgical History:  Procedure Laterality Date  . CHOLECYSTECTOMY N/A 09/28/2014   Procedure: LAPAROSCOPIC CHOLECYSTECTOMY;  Surgeon: Ralene Ok, MD;  Location: [place];  Service: General;  Laterality: N/A;  . CHOLECYSTECTOMY    . DIRECT LARYNGOSCOPY N/A 04/24/2014   Procedure: DIRECT LARYNGOSCOPY;  Surgeon: Ascencion Dike, MD;  Location: [place];  Service: ENT;  Laterality: N/A;  . FOREIGN BODY REMOVAL ESOPHAGEAL N/A 04/24/2014   Procedure: REMOVAL FOREIGN BODY ESOPHAGEAL;  Surgeon: Ascencion Dike, MD;  Location: [facility];  Service: ENT;  Laterality: N/A;  . WISDOM TOOTH EXTRACTION  2013   Family History:  Family History  Problem Relation Age of Onset  . Arthritis Mother   . Bronchitis Mother   . Asthma Mother   . Hearing loss Paternal Grandfather   . Cancer  Neg Hx   . Diabetes Neg Hx    Family Psychiatric  History: None reported. Social History:  History  Alcohol Use No     History  Drug Use No    Social History   Social History  . Marital status: Single    Spouse name: N/A  . Number of children: 1  . Years of education: 64 th    Occupational History  . [employer] [employer]    Social History Main Topics  . Smoking status: Never Smoker  . Smokeless tobacco: Never Used  . Alcohol use No  . Drug use: No  . Sexual activity: Yes    Partners: Male    Birth control/ protection: IUD, None   Other Topics Concern  . None   Social History Narrative   ** Merged History Encounter **       Live with roommate and son.    Starting at [employer]    Additional Social History:    History of alcohol / drug use?: Yes                    Sleep: Fair  Appetite:  Fair  Current Medications: Current Facility-Administered Medications  Medication Dose Route Frequency Provider Last Rate Last Dose  . acetaminophen (TYLENOL) tablet 650 mg  650 mg Oral Q6H PRN Patrecia Pour, NP   650 mg at 08/22/16 0914  . alum & mag hydroxide-simeth (MAALOX/MYLANTA) 200-200-20 MG/5ML suspension 30 mL  30 mL Oral Q4H PRN Patrecia Pour, NP      . docusate sodium (COLACE) capsule 100 mg  100 mg Oral BID Patrecia Pour, NP   100 mg at 08/22/16 0912  . feeding supplement (ENSURE ENLIVE) (ENSURE ENLIVE) liquid 237 mL  237 mL Oral BID BM Giani Winther B Londen Lorge, MD   237 mL at 08/22/16 0947  . hydrOXYzine (ATARAX/VISTARIL) tablet 25 mg  25 mg Oral TID PRN Clovis Fredrickson, MD      . ibuprofen (ADVIL,MOTRIN) tablet 600 mg  600 mg Oral Q8H PRN Patrecia Pour, NP   600 mg at 08/19/16 1257  . ibuprofen (ADVIL,MOTRIN) tablet 600 mg  600 mg Oral Q6H PRN Chanteria Haggard B Allene Furuya, MD      . lamoTRIgine (LAMICTAL) tablet 25 mg  25 mg Oral QHS Clovis Fredrickson, MD   25 mg at 08/21/16 2135  . magnesium hydroxide (MILK OF MAGNESIA) suspension 30 mL  30 mL Oral Daily PRN Patrecia Pour, NP      . metroNIDAZOLE (FLAGYL) tablet 500 mg  500 mg Oral Q12H Jazzlynn Rawe B Leshia Kope, MD   500 mg at 08/22/16 0913  . mirtazapine (REMERON) tablet 15 mg  15 mg Oral QHS Clovis Fredrickson, MD   15 mg at 08/21/16 2135  . ondansetron (ZOFRAN) tablet 4 mg  4 mg Oral Q8H PRN Patrecia Pour, NP      . promethazine  (PHENERGAN) tablet 25 mg  25 mg Oral Q6H PRN Nichoel Digiulio B Helton Oleson, MD      . risperiDONE (RISPERDAL) tablet 2 mg  2 mg Oral QHS Ranay Ketter B Arriona Prest, MD      . traZODone (DESYREL) tablet 100 mg  100 mg Oral QHS PRN Clovis Fredrickson, MD   100 mg at 08/21/16 2135    Lab Results:  Results for orders placed or performed during the hospital encounter of 08/19/16 (from the past 48 hour(s))  Lipid panel     Status: Abnormal  Collection Time: 08/22/16  6:58 AM  Result Value Ref Range   Cholesterol 129 0 - 200 mg/dL   Triglycerides 34 <150 mg/dL   HDL 40 (L) >40 mg/dL   Total CHOL/HDL Ratio 3.2 RATIO   VLDL 7 0 - 40 mg/dL   LDL Cholesterol 82 0 - 99 mg/dL    Comment:        Total Cholesterol/HDL:CHD Risk Coronary Heart Disease Risk Table                     Men   Women  1/2 Average Risk   3.4   3.3  Average Risk       5.0   4.4  2 X Average Risk   9.6   7.1  3 X Average Risk  23.4   11.0        Use the calculated Patient Ratio above and the CHD Risk Table to determine the patient's CHD Risk.        ATP III CLASSIFICATION (LDL):  <100     mg/dL   Optimal  100-129  mg/dL   Near or Above                    Optimal  130-159  mg/dL   Borderline  160-189  mg/dL   High  >190     mg/dL   Very High   TSH     Status: None   Collection Time: 08/22/16  6:58 AM  Result Value Ref Range   TSH 1.008 0.350 - 4.500 uIU/mL    Comment: Performed by a 3rd Generation assay with a functional sensitivity of <=0.01 uIU/mL.    Blood Alcohol level:  Lab Results  Component Value Date   ETH <5 08/19/2016   ETH <5 70/07/7492    Metabolic Disorder Labs: No results found for: HGBA1C, MPG Lab Results  Component Value Date   PROLACTIN 6.4 11/18/2014   PROLACTIN  11/14/2010    31.2 (NOTE)     Reference Ranges:                 Female:                       2.1 -  17.1 ng/ml                 Female:   Pregnant          9.7 - 208.5 ng/mL                           Non Pregnant      2.8 -  29.2 ng/mL                            Post  Menopausal   1.8 -  20.3 ng/mL                     Lab Results  Component Value Date   CHOL 129 08/22/2016   TRIG 34 08/22/2016   HDL 40 (L) 08/22/2016   CHOLHDL 3.2 08/22/2016   VLDL 7 08/22/2016   LDLCALC 82 08/22/2016    Physical Findings: AIMS:  , ,  ,  ,    CIWA:    COWS:     Musculoskeletal: Strength & Muscle Tone: within normal limits Gait & Station: normal Patient leans: N/A  Psychiatric Specialty Exam: Physical Exam  Nursing note and vitals reviewed.   Review of Systems  Genitourinary: Positive for dysuria and urgency.  Psychiatric/Behavioral: Positive for depression and suicidal ideas. The patient is nervous/anxious.   All other systems reviewed and are negative.   Blood pressure 110/66, pulse 88, temperature 98.6 F (37 C), temperature source Oral, resp. rate 18, height 5' (1.524 m), weight 46.7 kg (103 lb), SpO2 100 %.Body mass index is 20.12 kg/m.  General Appearance: Casual  Eye Contact:  Minimal  Speech:  Slow  Volume:  Decreased  Mood:  Depressed  Affect:  Blunt  Thought Process:  Goal Directed and Descriptions of Associations: Intact  Orientation:  Full (Time, Place, and Person)  Thought Content:  WDL  Suicidal Thoughts:  No  Homicidal Thoughts:  No  Memory:  Immediate;   Fair Recent;   Fair Remote;   Fair  Judgement:  Impaired  Insight:  Shallow  Psychomotor Activity:  Psychomotor Retardation  Concentration:  Concentration: Fair and Attention Span: Fair  Recall:  [employer] of Knowledge:  Fair  Language:  Fair  Akathisia:  No  Handed:  Right  AIMS (if indicated):     Assets:  Communication Skills Desire for Improvement Financial Resources/Insurance Housing Intimacy Physical Health Resilience Social Support Talents/Skills Transportation Vocational/Educational  ADL's:  Intact  Cognition:  WNL  Sleep:  Number of Hours: 6.75     Treatment Plan Summary: Daily contact with patient to assess and  evaluate symptoms and progress in treatment and Medication management   Ms. Khurana is a 21 year old female with a history of depression, anxiety, and mood is needed after suicide attempt by medication overdose in the context of social stressors.  1. Suicidal ideation. Resolved. The patient is able to contract for safety in the hospital.   2. Mood. We restarted Remeron for depression and initiated Lamictal for mood stabilization and Abilify for augmentation. She complains of nausea, possibly from Abilify. We will switch to Risperdal. .  3. UTI. We ordered urine culture and gave her a dose of fosfomycin. Culture is negative. UA pending.  4. Vaginosis. We will stop Metronidazole.  5. Anxiety. Hydroxyzine is available.  6. Insomnia. Trazodone is available.  7. Metabolic syndrome monitoring. Lipid panel and TSH are normal. HgbA1C is pending.   8. Headache. Will give Motrin.   9. Disposition. She will be discharged back to her apartment. She will follow up with [facility] [place].  Orson Slick, MD 08/22/2016, 2:11 PM

## 2016-08-22 NOTE — [facility] Group Notes (Signed)
[facility] LCSW Group Therapy  08/22/2016 10:59 AM  (Note Entry for Lynden OxfordKadijah R. Grant LCSWA)  Type of Therapy:  Group Therapy  Participation Level:  Active  Participation Quality:  Attentive  Affect:  Appropriate  Cognitive:  Alert  Insight:  Developing/Improving  Engagement in Therapy:  Improving  Modes of Intervention:  Activity, Discussion, Education, Problem-solving, Reality Testing and Support  Summary of Progress/Problems: Balance in life: Patients will discuss the concept of balance and how it looks and feels to be unbalanced. Pt will identify areas in their life that is unbalanced and ways to become more balanced. They discussed what aspects in their lives has influenced their self care. Patients also discussed self care in the areas of self regulation/control, hygiene/appearance, sleep/relaxation, healthy leisure, healthy eating habits, exercise, inner peace/spirituality, self improvement, sobriety, and health management. They were challenged to identify changes that are needed in order to improve self care. Patient discussed her mental health and that she tends to "worry too much" when she gets overwhelmed or stressed. CSW provided support to patient and discussed coping strategies with the group.   Scribe for Group Therapy Note Vander[place] Ranieri G. Artemio Dobie MSW, LCSWA 08/22/2016, 11:01 AM

## 2016-08-22 NOTE — [facility] Group Notes (Signed)
[facility] LCSW Group Therapy Note  Type of Therapy and Topic:  Group Therapy:  Goals Group: SMART Goals  Participation Level:  Patient did not attend group. CSW invited patient to group.   Description of Group:   The purpose of a daily goals group is to assist and guide patients in setting recovery/wellness-related goals.  The objective is to set goals as they relate to the crisis in which they were admitted. Patients will be using SMART goal modalities to set measurable goals.  Characteristics of realistic goals will be discussed and patients will be assisted in setting and processing how one will reach their goal. Facilitator will also assist patients in applying interventions and coping skills learned in psycho-education groups to the SMART goal and process how one will achieve defined goal.  Therapeutic Goals: -Patients will develop and document one goal related to or their crisis in which brought them into treatment. -Patients will be guided by LCSW using SMART goal setting modality in how to set a measurable, attainable, realistic and time sensitive goal.  -Patients will process barriers in reaching goal. -Patients will process interventions in how to overcome and successful in reaching goal.   Summary of Patient Progress:  Patient Goal: Patient did not attend group. CSW invited patient to group.    Therapeutic Modalities:   Motivational Interviewing Engineer, manufacturing systemsCognitive Behavioral Therapy Crisis Intervention Model SMART goals setting  Scribe for Group Therapy Note Maximina Pirozzi G. Garnette CzechSampson MSW, LCSWA 08/22/2016 10:31 AM

## 2016-08-22 NOTE — Progress Notes (Signed)
Recreation Therapy Notes  Date: 02.01.18 Time: 1:00 pm Location: Craft Room  Group Topic: Leisure Education  Goal Area(s) Addresses:  Patient will write at least one healthy leisure activity. Patient will identify positive emotions.  Behavioral Response: Attentive, Interactive  Intervention: Leisure Time Clock  Activity: Patients were instructed to write 2 healthy leisure activities on pieces of paper. Patient were given Leisure Time Clock worksheets to complete. Patients were instructed to collectively think of 10 positive emotions. Patient connected the leisure activities to the positive emotions.  Education: LRT educated patients on what makes leisure a good Associate Professorcoping skill.  Education Outcome: In group clarification offered  Clinical Observations/Feedback: Patient wrote 2 leisure activities, completed leisure time clock worksheet, thought of positive emotions, and connected the leisure activities to the emotions. Patient did not contribute to group discussion.  Jacquelynn CreeGreene,Annika Selke M, LRT/CTRS 08/22/2016 2:00 PM

## 2016-08-22 NOTE — Tx Team (Signed)
Interdisciplinary Treatment and Diagnostic Plan Update  08/22/2016 Time of Session: 10:30am Leah [name] MRN: [id number]  Principal Diagnosis: Major depressive disorder, recurrent severe without psychotic features (HCC)  Secondary Diagnoses: Principal Problem:   Major depressive disorder, recurrent severe without psychotic features (HCC) Active Problems:   Suicide attempt by drug ingestion (HCC)   UTI (urinary tract infection)   Current Medications:  Current Facility-Administered Medications  Medication Dose Route Frequency Provider Last Rate Last Dose  . acetaminophen (TYLENOL) tablet 650 mg  650 mg Oral Q6H PRN Charm RingsJamison Y Lord, NP   650 mg at 08/22/16 0914  . alum & mag hydroxide-simeth (MAALOX/MYLANTA) 200-200-20 MG/5ML suspension 30 mL  30 mL Oral Q4H PRN Charm RingsJamison Y Lord, NP      . docusate sodium (COLACE) capsule 100 mg  100 mg Oral BID Charm RingsJamison Y Lord, NP   100 mg at 08/22/16 0912  . feeding supplement (ENSURE ENLIVE) (ENSURE ENLIVE) liquid 237 mL  237 mL Oral BID BM Jolanta B Pucilowska, MD   237 mL at 08/22/16 0947  . hydrOXYzine (ATARAX/VISTARIL) tablet 25 mg  25 mg Oral TID PRN Shari ProwsJolanta B Pucilowska, MD      . ibuprofen (ADVIL,MOTRIN) tablet 600 mg  600 mg Oral Q8H PRN Charm RingsJamison Y Lord, NP   600 mg at 08/19/16 1257  . ibuprofen (ADVIL,MOTRIN) tablet 600 mg  600 mg Oral Q6H PRN Jolanta B Pucilowska, MD      . lamoTRIgine (LAMICTAL) tablet 25 mg  25 mg Oral QHS Shari ProwsJolanta B Pucilowska, MD   25 mg at 08/21/16 2135  . magnesium hydroxide (MILK OF MAGNESIA) suspension 30 mL  30 mL Oral Daily PRN Charm RingsJamison Y Lord, NP      . metroNIDAZOLE (FLAGYL) tablet 500 mg  500 mg Oral Q12H Jolanta B Pucilowska, MD   500 mg at 08/22/16 0913  . mirtazapine (REMERON) tablet 15 mg  15 mg Oral QHS Shari ProwsJolanta B Pucilowska, MD   15 mg at 08/21/16 2135  . ondansetron (ZOFRAN) tablet 4 mg  4 mg Oral Q8H PRN Charm RingsJamison Y Lord, NP      . promethazine (PHENERGAN) tablet 25 mg  25 mg Oral Q6H PRN Jolanta B Pucilowska, MD       . risperiDONE (RISPERDAL) tablet 2 mg  2 mg Oral QHS Jolanta B Pucilowska, MD      . traZODone (DESYREL) tablet 100 mg  100 mg Oral QHS PRN Shari ProwsJolanta B Pucilowska, MD   100 mg at 08/21/16 2135   PTA Medications: Prescriptions Prior to Admission  Medication Sig Dispense Refill Last Dose  . cyclobenzaprine (FLEXERIL) 10 MG tablet Take 10 mg by mouth 3 (three) times daily as needed for muscle spasms.   08/18/2016 at Unknown time  . DM-Doxylamine-Acetaminophen (NYQUIL COLD & FLU PO) Take 1 capsule by mouth at bedtime as needed.   Past Month at Unknown time  . hydrOXYzine (ATARAX/VISTARIL) 25 MG tablet Take 1 tablet (25 mg total) by mouth every 6 (six) hours as needed (anxiety/agitation or CIWA < or = 10). (Patient not taking: Reported on 08/02/2016) 30 tablet 0 Not Taking at Unknown time  . ibuprofen (ADVIL,MOTRIN) 600 MG tablet Take 1 tablet (600 mg total) by mouth every 6 (six) hours as needed for moderate pain. (Patient not taking: Reported on 08/02/2016) 30 tablet 0 Not Taking at Unknown time  . indomethacin (INDOCIN) 25 MG capsule Take 25 mg by mouth 3 (three) times daily as needed for mild pain or moderate pain.  unk  . levonorgestrel (MIRENA) 20 MCG/24HR IUD 1 each by Intrauterine route once. Placed in 09/2015   cont  . loratadine (CLARITIN) 10 MG tablet Take 1 tablet (10 mg total) by mouth daily. (Patient not taking: Reported on 08/02/2016) 30 tablet 0 Not Taking at Unknown time  . mirtazapine (REMERON) 15 MG tablet Take 1 tablet (15 mg total) by mouth at bedtime. 30 tablet 0 Past Week at Unknown time  . ondansetron (ZOFRAN) 8 MG tablet Take 1 tablet (8 mg total) by mouth every 8 (eight) hours as needed for nausea or vomiting. (Patient not taking: Reported on 08/19/2016) 6 tablet 0 Completed Course at Unknown time    Patient Stressors: Medication change or noncompliance Traumatic event  Patient Strengths: Average or above average intelligence Capable of independent living Communication  skills Work skills  Treatment Modalities: Medication Management, Group therapy, Case management,  1 to 1 session with clinician, Psychoeducation, Recreational therapy.   Physician Treatment Plan for Primary Diagnosis: Major depressive disorder, recurrent severe without psychotic features (HCC) Long Term Goal(s): Improvement in symptoms so as ready for discharge NA   Short Term Goals: Ability to identify changes in lifestyle to reduce recurrence of condition will improve Ability to verbalize feelings will improve Ability to disclose and discuss suicidal ideas Ability to demonstrate self-control will improve Ability to identify and develop effective coping behaviors will improve Ability to maintain clinical measurements within normal limits will improve Compliance with prescribed medications will improve Ability to identify triggers associated with substance abuse/mental health issues will improve NA  Medication Management: Evaluate patient's response, side effects, and tolerance of medication regimen.  Therapeutic Interventions: 1 to 1 sessions, Unit Group sessions and Medication administration.  Evaluation of Outcomes: Progressing  Physician Treatment Plan for Secondary Diagnosis: Principal Problem:   Major depressive disorder, recurrent severe without psychotic features (HCC) Active Problems:   Suicide attempt by drug ingestion (HCC)   UTI (urinary tract infection)  Long Term Goal(s): Improvement in symptoms so as ready for discharge NA   Short Term Goals: Ability to identify changes in lifestyle to reduce recurrence of condition will improve Ability to verbalize feelings will improve Ability to disclose and discuss suicidal ideas Ability to demonstrate self-control will improve Ability to identify and develop effective coping behaviors will improve Ability to maintain clinical measurements within normal limits will improve Compliance with prescribed medications will  improve Ability to identify triggers associated with substance abuse/mental health issues will improve NA     Medication Management: Evaluate patient's response, side effects, and tolerance of medication regimen.  Therapeutic Interventions: 1 to 1 sessions, Unit Group sessions and Medication administration.  Evaluation of Outcomes: Progressing   RN Treatment Plan for Primary Diagnosis: Major depressive disorder, recurrent severe without psychotic features (HCC) Long Term Goal(s): Knowledge of disease and therapeutic regimen to maintain health will improve  Short Term Goals:  Ability to participate in decision making will improve, Ability to verbalize feelings will improve, Ability to identify and develop effective coping behaviors will improve and Compliance with prescribed medications will improve  Medication Management: RN will administer medications as ordered by provider, will assess and evaluate patient's response and provide education to patient for prescribed medication. RN will report any adverse and/or side effects to prescribing provider.  Therapeutic Interventions: 1 on 1 counseling sessions, Psychoeducation, Medication administration, Evaluate responses to treatment, Monitor vital signs and CBGs as ordered, Perform/monitor CIWA, COWS, AIMS and Fall Risk screenings as ordered, Perform wound care treatments as ordered.  Evaluation  of Outcomes: Progressing   LCSW Treatment Plan for Primary Diagnosis: Major depressive disorder, recurrent severe without psychotic features (HCC) Long Term Goal(s): Safe transition to appropriate next level of care at discharge, Engage patient in therapeutic group addressing interpersonal concerns.  Short Term Goals: Engage patient in aftercare planning with referrals and resources, Increase social support, Increase ability to appropriately verbalize feelings, Increase emotional regulation and Increase skills for wellness and recovery  Therapeutic  Interventions: Assess for all discharge needs, 1 to 1 time with Social worker, Explore available resources and support systems, Assess for adequacy in community support network, Educate family and significant other(s) on suicide prevention, Complete Psychosocial Assessment, Interpersonal group therapy.  Evaluation of Outcomes: Progressing   Progress in Treatment: Attending groups: Yes. Participating in groups: Yes. Taking medication as prescribed: Yes. Toleration medication: Yes. Family/Significant other contact made: Yes, individual(s) contacted:  mother Patient understands diagnosis: Yes. Discussing patient identified problems/goals with staff: Yes. Medical problems stabilized or resolved: Yes. Denies suicidal/homicidal ideation: Yes. Issues/concerns per patient self-inventory: No. Other: n/a  New problem(s) identified: None identified at this time.   New Short Term/Long Term Goal(s): None identified at this time.   Discharge Plan or Barriers: [facility] services including medication management and outpatient therapy.   Reason for Continuation of Hospitalization: Anxiety Depression Medication stabilization  Estimated Length of Stay: 3 to 5 days.   Attendees: Patient: Leah [name] 08/22/2016 12:07 PM  Physician: Dr. Kristine Linea, MD 08/22/2016 12:07 PM  Nursing: Leah Amato, RN 08/22/2016 12:07 PM  RN Care Manager: 08/22/2016 12:07 PM  Social Worker: Leah [name] 08/22/2016 12:07 PM  Recreational Therapist:  08/22/2016 12:07 PM  Other:  08/22/2016 12:07 PM  Other:  08/22/2016 12:07 PM  Other: 08/22/2016 12:07 PM    Scribe for Treatment Team: Arelia Longest, LCSWA 08/22/2016 12:11 PM

## 2016-08-22 NOTE — Progress Notes (Signed)
08/22/16 0905 [facility] [place]. Pt was last seen in Sept 2017.  Due to this being longer than 90 days, she needs to come to walk in clinic to reestablish services.  Walk in would set up counseling and the counselor would help with med management. Daleen SquibbGreg Laban Orourke, LCSW

## 2016-08-22 NOTE — Plan of Care (Signed)
Problem: Coping: Goal: Ability to cope will improve Working on coping skills,handout given

## 2016-08-22 NOTE — [facility] Group Notes (Signed)
[facility] Group Notes:  (Nursing/MHT/Case Management/Adjunct)  Date:  08/22/2016  Time:  3:50 PM  Type of Therapy:  Group Therapy  Participation Level:  Did Not Attend    Trevor Wilkie De'Chelle Hoke Baer 08/22/2016, 3:50 PM 

## 2016-08-23 LAB — HEMOGLOBIN A1C
HEMOGLOBIN A1C: 5.2 % (ref 4.8–5.6)
MEAN PLASMA GLUCOSE: 103 mg/dL

## 2016-08-23 MED ORDER — SULFAMETHOXAZOLE-TRIMETHOPRIM 400-80 MG PO TABS: 1.0000 | ORAL_TABLET | Freq: Two times a day (BID) | ORAL | 0 refills | Status: DC

## 2016-08-23 MED ORDER — PANTOPRAZOLE SODIUM 40 MG PO TBEC
40.0000 mg | DELAYED_RELEASE_TABLET | Freq: Every day | ORAL | Status: DC
  Administered 2016-08-23: 40 mg via ORAL
  Filled 2016-08-23: qty 1

## 2016-08-23 MED ORDER — MIRTAZAPINE 15 MG PO TABS: 15.0000 mg | ORAL_TABLET | Freq: Every day | ORAL | 1 refills | Status: DC

## 2016-08-23 MED ORDER — PANTOPRAZOLE SODIUM 40 MG PO TBEC: 40.0000 mg | DELAYED_RELEASE_TABLET | Freq: Every day | ORAL | 1 refills | Status: DC

## 2016-08-23 MED ORDER — RISPERIDONE 2 MG PO TABS: 2.0000 mg | ORAL_TABLET | Freq: Every day | ORAL | 1 refills | Status: DC

## 2016-08-23 MED ORDER — LAMOTRIGINE 25 MG PO TABS: 25.0000 mg | ORAL_TABLET | Freq: Every day | ORAL | 1 refills | Status: DC

## 2016-08-23 NOTE — Progress Notes (Signed)
Patient denies SI/HI, denies A/V hallucinations. Patient verbalizes understanding of discharge instructions, follow up care and prescriptions.7 days medicine given to patient. Patient given all belongings from locker. Patient escorted out by staff, transported by family. 
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
## 2016-08-23 NOTE — [facility] Suicide Risk Assessment (Signed)
[facility]   Principal Problem: Major depressive disorder, recurrent severe without psychotic features [facility](HCC) Discharge Diagnoses:  Patient Active Problem List   Diagnosis Date Noted  . Major depressive disorder, recurrent severe without psychotic features (HCC) [F33.2] 08/19/2016  . UTI (urinary tract infection) [N39.0] 08/19/2016  . Suicide attempt by drug ingestion (HCC) [T50.902A]   . Contraception [Z30.9] 03/10/2014  . Biliary colic [K80.50] 03/10/2014  . BV (bacterial vaginosis) [N76.0, B96.89] 03/10/2014  . Depression [F32.9] 03/16/2013  . Allergic rhinitis, seasonal [J30.2] 01/26/2013  . Anemia, iron deficiency [D50.9] 12/01/2012    Total Time spent with patient: 30 minutes  Musculoskeletal: Strength & Muscle Tone: within normal limits Gait & Station: normal Patient leans: N/A  Psychiatric Specialty Exam: Review of Systems  All other systems reviewed and are negative.   Blood pressure (!) 97/54, pulse 73, temperature 98.4 F (36.9 C), temperature source Oral, resp. rate 18, height 5' (1.524 m), weight 46.7 kg (103 lb), SpO2 100 %.Body mass index is 20.12 kg/m.  General Appearance: Casual  Eye Contact::  Good  Speech:  Clear and Coherent409  Volume:  Normal  Mood:  Euthymic  Affect:  Appropriate  Thought Process:  Goal Directed and Descriptions of Associations: Intact  Orientation:  Full (Time, Place, and Person)  Thought Content:  WDL  Suicidal Thoughts:  No  Homicidal Thoughts:  No  Memory:  Immediate;   Fair Recent;   Fair Remote;   Fair  Judgement:  Impaired  Insight:  Shallow  Psychomotor Activity:  Normal  Concentration:  Fair  Recall:  FiservFair  Fund of Knowledge:Fair  Language: Fair  Akathisia:  No  Handed:  Right  AIMS (if indicated):     Assets:  Communication Skills Desire for Improvement Financial Resources/Insurance Housing Physical Health Resilience Social Support Vocational/Educational  Sleep:  Number of  Hours: 5.15  Cognition: WNL  ADL's:  Intact   Mental Status Per Nursing Assessment::   On Admission:  Self-harm thoughts  Demographic Factors:  Adolescent or young adult and Living alone  Loss Factors: Loss of significant relationship  Historical Factors: Prior suicide attempts, Family history of mental illness or substance abuse and Impulsivity  Risk Reduction Factors:   Responsible for children under 10018 years of age, Sense of responsibility to family, Employed, Positive social support and Positive therapeutic relationship  Continued Clinical Symptoms:  Bipolar Disorder:   Depressive phase Depression:   Impulsivity  Cognitive Features That Contribute To Risk:  None    Suicide Risk:  Minimal: No identifiable suicidal ideation.  Patients presenting with no risk factors but with morbid ruminations; may be classified as minimal risk based on the severity of the depressive symptoms  [facility]-up Information    [facility]).   Specialty:  Professional Counselor Why:  [facility] within 7 days of your discharge.  Walk in clinic hours are 8:30am-12noon and 1pm-2:30pm, Monday-Friday. Contact information: [facility] [address] [place] [address] [phone]           Plan Of Care/Follow-up recommendations:  Activity:  as tolerated. Diet:  regular. Other:  keep follow up appointments.  Kristine LineaJolanta Audie Wieser, MD 08/23/2016, 9:41 AM

## 2016-08-23 NOTE — BHH Group Notes (Signed)
BHH LCSW Group Therapy Note  Date/Time: 08/23/16, 0930  Type of Therapy and Topic:  Group Therapy:  Feelings around Relapse and Recovery  Participation Level:  Minimal (remains to same)  Mood: pleasant  Description of Group:    Patients in this group will discuss emotions they experience before and after a relapse. They will process how experiencing these feelings, or avoidance of experiencing them, relates to having a relapse. Facilitator will guide patients to explore emotions they have related to recovery. Patients will be encouraged to process which emotions are more powerful. They will be guided to discuss the emotional reaction significant others in their lives may have to patients' relapse or recovery. Patients will be assisted in exploring ways to respond to the emotions of others without this contributing to a relapse.  Therapeutic Goals: 1. Patient will identify two or more emotions that lead to relapse for them:  2. Patient will identify two emotions that result when they relapse:  3. Patient will identify two emotions related to recovery:  4. Patient will demonstrate ability to communicate their needs through discussion and/or role plays.   Summary of Patient Progress:  Pt identified positive emotions (excited, wild) and negative emotions (iritation, sorrow) in response to a discussion about relapse prevention.  Pt did not make independent contributions to the group and spent the last half of the group staring out the window, but she did respond when addressed by CSW.    Therapeutic Modalities:   Cognitive Behavioral Therapy Solution-Focused Therapy Assertiveness Training Relapse Prevention Therapy  Daleen SquibbGreg Llewelyn Sheaffer, LCSW

## 2016-08-23 NOTE — Plan of Care (Signed)
Problem: Coping: Goal: Ability to demonstrate self-control will improve Outcome: Not Met (add Reason) Patient will focus on the present.

## 2016-08-23 NOTE — Progress Notes (Signed)
Calm and cooperative. Guarded. Flat affect. Attend group. Limited interaction with peers and staff. Refused flagyl. Med compliant. No PRNs given. Denies SI/HI/AVH. No c/o pain/discomfort noted.

## 2016-08-24 NOTE — Progress Notes (Signed)
  [facility] :  Will you be returning to the same living situation after discharge:  Yes,  home At discharge, do you have transportation home?: Yes,  family Do you have the ability to pay for your medications: Yes,  patient has insurance  Release of information consent forms completed and in the chart;  Patient's signature needed at discharge.  Patient to Follow up at: Follow-up [employer]    [facility]. Go in 7 day(s).   Specialty:  Professional Counselor Why:  [facility] 7 days of your discharge.  Walk in clinic hours are 8:30am-12noon and 1pm-2:30pm, Monday-Friday. Contact information: [facility] [address] [place] [address] [phone]           Next level of care provider has [facility] Link:no  Safety Planning and Suicide Prevention discussed: Yes,  with patient and mother  Have you used any form of tobacco in the last 30 days? (Cigarettes, Smokeless Tobacco, Cigars, and/or Pipes): No  Has patient been referred to the Quitline?: N/A patient is not a smoker  Patient has been referred for addiction treatment: Yes  Crysta Gulick G. Garnette CzechSampson MSW, LCSWA 08/24/2016, 9:31 AM

## 2016-08-26 NOTE — Progress Notes (Signed)
Recreation Therapy Notes  INPATIENT RECREATION TR PLAN  Patient Details Name: Leah [name] MRN: [id number] DOB: [birth year] Today's Date: 08/26/2016  Rec Therapy Plan Is patient appropriate for Therapeutic Recreation?: Yes Treatment times per week: At least once a week TR Treatment/Interventions: 1:1 session, Group participation (Comment) (Appropriate participation in daily recreational therapy tx)  Discharge Criteria Pt will be discharged from therapy if:: Treatment goals are met, Discharged  Discharge Summary Short term goals set: See Care Plan Short term goals met: Complete Progress toward goals comments: One-to-one attended Which groups?: Self-esteem, Leisure education, Other (Comment) (Self-expression) One-to-one attended: Anger management, stress management Reason goals not met: N/A Therapeutic equipment acquired: None Reason patient discharged from therapy: Discharge from hospital Pt/family agrees with progress & goals achieved: Yes Date patient discharged from therapy: 08/23/16   Leonette Monarch, LRT/CTRS 08/26/2016, 8:17 AM

## 2016-12-03 ENCOUNTER — Encounter (HOSPITAL_COMMUNITY): Payer: Self-pay | Admitting: Family Medicine

## 2016-12-03 ENCOUNTER — Ambulatory Visit (HOSPITAL_COMMUNITY)
Admission: EM | Admit: 2016-12-03 | Discharge: 2016-12-03 | Disposition: A | Discharge status: Home / Self Care | Payer: Medicaid Other | Attending: Internal Medicine | Admitting: Internal Medicine

## 2016-12-03 DIAGNOSIS — B9789 Other viral agents as the cause of diseases classified elsewhere: Secondary | ICD-10-CM

## 2016-12-03 DIAGNOSIS — R05 Cough: Secondary | ICD-10-CM | POA: Insufficient documentation

## 2016-12-03 DIAGNOSIS — Z79899 Other long term (current) drug therapy: Secondary | ICD-10-CM | POA: Insufficient documentation

## 2016-12-03 DIAGNOSIS — J029 Acute pharyngitis, unspecified: Secondary | ICD-10-CM | POA: Insufficient documentation

## 2016-12-03 LAB — POCT RAPID STREP A: Streptococcus, Group A Screen (Direct): NEGATIVE

## 2016-12-03 MED ORDER — DEXAMETHASONE SODIUM PHOSPHATE 10 MG/ML IJ SOLN
10.0000 mg | Freq: Once | INTRAMUSCULAR | Status: AC
  Administered 2016-12-03: 10 mg via INTRAMUSCULAR

## 2016-12-03 MED ORDER — BENZONATATE 100 MG PO CAPS: 100.0000 mg | ORAL_CAPSULE | Freq: Three times a day (TID) | ORAL | 0 refills | Status: DC

## 2016-12-03 MED ORDER — DEXAMETHASONE SODIUM PHOSPHATE 10 MG/ML IJ SOLN
INTRAMUSCULAR | Status: AC
  Filled 2016-12-03: qty 1

## 2016-12-03 MED ORDER — MAGIC MOUTHWASH W/LIDOCAINE: 5.0000 mL | Freq: Three times a day (TID) | ORAL | 0 refills | Status: DC | PRN

## 2016-12-03 NOTE — ED Provider Notes (Signed)
CSN: [id number]     Arrival date & time 12/03/16  1258 History   First MD Initiated Contact with Patient 12/03/16 1416     Chief Complaint  Patient presents with  . Sore Throat   (Consider location/radiation/quality/duration/timing/severity/associated sxs/prior Treatment) 21 year old female presents to clinic for evaluation of sore throat and cough for 2 days.   The history is provided by the patient.  Sore Throat  This is a new problem. The current episode started 2 days ago. The problem occurs constantly. The problem has been gradually worsening. Pertinent negatives include no chest pain, no abdominal pain, no headaches and no shortness of breath. The symptoms are aggravated by swallowing. Nothing relieves the symptoms. She has tried acetaminophen for the symptoms. The treatment provided no relief.    Past Medical History:  Diagnosis Date  . Anemia 2013  . Anxiety 2011   no meds during pregnancy  . Bipolar 1 disorder (HCC)   . Chlamydia   . Gall stones   . GERD (gastroesophageal reflux disease)   . 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 Seizure [facility](HCC) 2011   pseudoseizures   Past Surgical History:  Procedure Laterality Date  . CHOLECYSTECTOMY N/A 09/28/2014   Procedure: LAPAROSCOPIC CHOLECYSTECTOMY;  Surgeon: Axel FillerArmando Ramirez, MD;  Location: [facility] OR;  Service: General;  Laterality: N/A;  . CHOLECYSTECTOMY    . DIRECT LARYNGOSCOPY N/A 04/24/2014   Procedure: DIRECT LARYNGOSCOPY;  Surgeon: Darletta MollSui W Teoh, MD;  [facility];  Service: ENT;  Laterality: N/A;  . FOREIGN BODY REMOVAL ESOPHAGEAL N/A 04/24/2014   Procedure: REMOVAL FOREIGN BODY ESOPHAGEAL;  Surgeon: Darletta MollSui W Teoh, MD;  [facility];  Service: ENT;  Laterality: N/A;  . WISDOM TOOTH EXTRACTION  2013   Family History  Problem Relation Age of Onset  . Arthritis Mother   . Bronchitis Mother   . Asthma Mother   . Hearing loss Paternal Grandfather   . Cancer Neg Hx   . Diabetes Neg Hx    Social History   Substance Use Topics  . Smoking status: Never Smoker  . Smokeless tobacco: Never Used  . Alcohol use No   OB History    Gravida Para Term Preterm AB Living   1 1 1  0 0 1   SAB TAB Ectopic Multiple Live Births   0 0 0   1     Review of Systems  Constitutional: Positive for chills and fever. Negative for appetite change.  HENT: Positive for congestion, rhinorrhea and sore throat. Negative for sinus pain and sinus pressure.   Eyes: Negative.   Respiratory: Negative for shortness of breath and wheezing.   Cardiovascular: Negative for chest pain and palpitations.  Gastrointestinal: Negative for abdominal pain, diarrhea, nausea and vomiting.  Musculoskeletal: Negative.   Skin: Negative.   Neurological: Negative for facial asymmetry and headaches.    Allergies  Patient has no known allergies.  Home Medications   Prior to Admission medications   Medication Sig Start Date End Date Taking? Authorizing Provider  benzonatate (TESSALON) 100 MG capsule Take 1 capsule (100 mg total) by mouth every 8 (eight) hours. 12/03/16   Dorena BodoKennard, Talene Glastetter, NP  lamoTRIgine (LAMICTAL) 25 MG tablet Take 1 tablet (25 mg total) by mouth at bedtime. 08/23/16   Pucilowska, Ellin GoodieJolanta B, MD  levonorgestrel (MIRENA) 20 MCG/24HR IUD 1 each by Intrauterine route once. Placed in 09/2015    [provider]  magic mouthwash w/lidocaine SOLN Take 5 mLs  by mouth 3 (three) times daily as needed for mouth pain. 12/03/16   Dorena Bodo, NP  mirtazapine (REMERON) 15 MG tablet Take 1 tablet (15 mg total) by mouth at bedtime. 08/23/16   Pucilowska, Braulio Conte B, MD  pantoprazole (PROTONIX) 40 MG tablet Take 1 tablet (40 mg total) by mouth daily. 08/23/16   Pucilowska, Braulio Conte B, MD  risperiDONE (RISPERDAL) 2 MG tablet Take 1 tablet (2 mg total) by mouth at bedtime. 08/23/16   Pucilowska, Ellin Goodie, MD   Meds Ordered and Administered this Visit   Medications  dexamethasone (DECADRON) injection 10 mg (10 mg Intramuscular  Given 12/03/16 1432)    BP 117/63   Pulse 83   Temp 99.7 F (37.6 C)   Resp 18   SpO2 100%  No data found.   Physical Exam  Constitutional: She is oriented to person, place, and time. She appears well-developed and well-nourished. No distress.  HENT:  Head: Normocephalic.  Right Ear: Tympanic membrane and external ear normal.  Left Ear: Tympanic membrane and external ear normal.  Nose: Nose normal.  Mouth/Throat: Posterior oropharyngeal erythema present. No posterior oropharyngeal edema. Tonsils are 3+ on the right. Tonsils are 3+ on the left. No tonsillar exudate.  Eyes: Conjunctivae are normal.  Neck: Normal range of motion.  Cardiovascular: Normal rate and regular rhythm.   Pulmonary/Chest: Effort normal and breath sounds normal.  Abdominal: Soft. Bowel sounds are normal.  Lymphadenopathy:       Head (right side): Submandibular and tonsillar adenopathy present. No submental and no preauricular adenopathy present.       Head (left side): Submandibular and tonsillar adenopathy present. No submental and no preauricular adenopathy present.    She has no cervical adenopathy.  Neurological: She is alert and oriented to person, place, and time.  Skin: Skin is warm and dry. Capillary refill takes less than 2 seconds. She is not diaphoretic.  Psychiatric: She has a normal mood and affect. Her behavior is normal.  Nursing note and vitals reviewed.   Urgent Care Course     Procedures (including critical care time)  Labs Review Labs Reviewed  POCT RAPID STREP A    Imaging Review No results found.      MDM   1. Viral pharyngitis    Strep test negative, most likely viral pharyngitis. Recommend rest, plenty of fluids, given Tessalon for cough, magic mouthwash for pain. Symptoms persist past one week return to clinic.     Dorena Bodo, NP 12/03/16 1435

## 2016-12-03 NOTE — Discharge Instructions (Signed)
Your strep test was negative. You most likely have a viral pharyngitis. For cough, I have prescribed a medication called Tessalon. Take 1 tablet every 8 hours as needed for your cough. For pain, prescribed Magic mouthwash with lidocaine, swish and swallow up to 3 times a day. May take over-the-counter Tylenol, or Motrin as needed. If your pain persist, or fails to resolve, return to clinic.

## 2016-12-03 NOTE — ED Triage Notes (Signed)
Pt here for sore throat x 2 days 

## 2016-12-05 ENCOUNTER — Encounter (HOSPITAL_COMMUNITY): Payer: Self-pay | Admitting: Emergency Medicine

## 2016-12-05 ENCOUNTER — Ambulatory Visit (HOSPITAL_COMMUNITY)
Admission: EM | Admit: 2016-12-05 | Discharge: 2016-12-05 | Disposition: A | Discharge status: Home / Self Care | Payer: Medicaid Other | Attending: Family Medicine | Admitting: Family Medicine

## 2016-12-05 DIAGNOSIS — J208 Acute bronchitis due to other specified organisms: Secondary | ICD-10-CM

## 2016-12-05 DIAGNOSIS — R05 Cough: Secondary | ICD-10-CM | POA: Diagnosis not present

## 2016-12-05 DIAGNOSIS — R059 Cough, unspecified: Secondary | ICD-10-CM

## 2016-12-05 MED ORDER — METHYLPREDNISOLONE 4 MG PO TBPK: ORAL_TABLET | ORAL | 0 refills | Status: DC

## 2016-12-05 MED ORDER — BENZONATATE 100 MG PO CAPS: 200.0000 mg | ORAL_CAPSULE | Freq: Three times a day (TID) | ORAL | 0 refills | Status: DC | PRN

## 2016-12-05 MED ORDER — AZITHROMYCIN 250 MG PO TABS: 250.0000 mg | ORAL_TABLET | Freq: Every day | ORAL | 0 refills | Status: DC

## 2016-12-05 NOTE — ED Provider Notes (Signed)
CSN: [id number]     Arrival date & time 12/05/16  1910 History   None    Chief Complaint  Patient presents with  . Cough   (Consider location/radiation/quality/duration/timing/severity/associated sxs/prior Treatment) Patient c/o persistent cough and uri sx's for over a week.   The history is provided by the patient.  Cough  Cough characteristics:  Productive Sputum characteristics:  White Severity:  Moderate Onset quality:  Sudden Duration:  1 week Timing:  Constant Progression:  Worsening Chronicity:  New Smoker: no   Context: upper respiratory infection and weather changes   Relieved by:  Rest and cough suppressants Worsened by:  Nothing Ineffective treatments:  None tried   Past Medical History:  Diagnosis Date  . Anemia 2013  . Anxiety 2011   no meds during pregnancy  . Bipolar 1 disorder (HCC)   . Chlamydia   . Gall stones   . GERD (gastroesophageal reflux disease)   . 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 Seizure [facility]) 2011   pseudoseizures   Past Surgical History:  Procedure Laterality Date  . CHOLECYSTECTOMY N/A 09/28/2014   Procedure: LAPAROSCOPIC CHOLECYSTECTOMY;  Surgeon: Axel Filler, MD;  Location: [facility] OR;  Service: General;  Laterality: N/A;  . CHOLECYSTECTOMY    . DIRECT LARYNGOSCOPY N/A 04/24/2014   Procedure: DIRECT LARYNGOSCOPY;  Surgeon: [name], MD;  Location: [facility] OR;  Service: ENT;  Laterality: N/A;  . FOREIGN BODY REMOVAL ESOPHAGEAL N/A 04/24/2014   Procedure: REMOVAL FOREIGN BODY ESOPHAGEAL;  Surgeon: [name], MD;  Location: [facility] OR;  Service: ENT;  Laterality: N/A;  . WISDOM TOOTH EXTRACTION  2013   Family History  Problem Relation Age of Onset  . Arthritis Mother   . Bronchitis Mother   . Asthma Mother   . Hearing loss Paternal Grandfather   . Cancer Neg Hx   . Diabetes Neg Hx    Social History  Substance Use Topics  . Smoking status: Never Smoker  . Smokeless tobacco: Never Used  . Alcohol use No   OB  History    Gravida Para Term Preterm AB Living   1 1 1  0 0 1   SAB TAB Ectopic Multiple Live Births   0 0 0   1     Review of Systems  Constitutional: Negative.   HENT: Positive for congestion.   Eyes: Negative.   Respiratory: Positive for cough.   Cardiovascular: Negative.   Gastrointestinal: Negative.   Endocrine: Negative.   Genitourinary: Negative.   Musculoskeletal: Negative.   Allergic/Immunologic: Negative.   Neurological: Negative.   Hematological: Negative.   Psychiatric/Behavioral: Negative.     Allergies  Patient has no known allergies.  Home Medications   Prior to Admission medications   Medication Sig Start Date End Date Taking? Authorizing Provider  azithromycin (ZITHROMAX) 250 MG tablet Take 1 tablet (250 mg total) by mouth daily. Take first 2 tablets together, then 1 every day until finished. 12/05/16   Deatra Canter, FNP  benzonatate (TESSALON) 100 MG capsule Take 1 capsule (100 mg total) by mouth every 8 (eight) hours. 12/03/16   Dorena Bodo, NP  benzonatate (TESSALON) 100 MG capsule Take 2 capsules (200 mg total) by mouth 3 (three) times daily as needed for cough. 12/05/16   Deatra Canter, FNP  lamoTRIgine (LAMICTAL) 25 MG tablet Take 1 tablet (25 mg total) by mouth at bedtime. 08/23/16   Pucilowska, Ellin Goodie, MD  levonorgestrel (MIRENA) 20 MCG/24HR  IUD 1 each by Intrauterine route once. Placed in 09/2015    [provider]  magic mouthwash w/lidocaine SOLN Take 5 mLs by mouth 3 (three) times daily as needed for mouth pain. 12/03/16   Dorena BodoKennard, Lawrence, NP  methylPREDNISolone (MEDROL DOSEPAK) 4 MG TBPK tablet Take 6-5-4-3-2-1 po qd 12/05/16   Deatra Canterxford, William J, FNP  mirtazapine (REMERON) 15 MG tablet Take 1 tablet (15 mg total) by mouth at bedtime. 08/23/16   Pucilowska, Braulio ConteJolanta B, MD  pantoprazole (PROTONIX) 40 MG tablet Take 1 tablet (40 mg total) by mouth daily. 08/23/16   Pucilowska, Braulio ConteJolanta B, MD  risperiDONE (RISPERDAL) 2 MG tablet Take 1  tablet (2 mg total) by mouth at bedtime. 08/23/16   Pucilowska, Ellin GoodieJolanta B, MD   Meds Ordered and Administered this Visit  Medications - No data to display  BP (!) 102/59 (BP Location: Right Arm)   Pulse (!) 110   Temp 99.1 F (37.3 C) (Oral)   Resp 18   SpO2 99%  No data found.   Physical Exam  Constitutional: She appears well-developed.  HENT:  Head: Normocephalic and atraumatic.  Right Ear: External ear normal.  Eyes: Conjunctivae and EOM are normal. Pupils are equal, round, and reactive to light.  Neck: Normal range of motion. Neck supple.  Cardiovascular: Normal rate, regular rhythm and normal heart sounds.   Pulmonary/Chest: Effort normal and breath sounds normal.  Musculoskeletal: She exhibits no tenderness.  Nursing note and vitals reviewed.   Urgent Care Course     Procedures (including critical care time)  Labs Review Labs Reviewed - No data to display  Imaging Review No results found.   Visual Acuity Review  Right Eye Distance:   Left Eye Distance:   Bilateral Distance:    Right Eye Near:   Left Eye Near:    Bilateral Near:         MDM   1. Cough   2. Acute bronchitis due to other specified organisms    Medrol dose pack as directed 4mg  #21 Zithromax pack Tessalon Perles 200mg  po tid prn      Deatra CanterOxford, William J, FNP 12/05/16 2019

## 2016-12-05 NOTE — ED Triage Notes (Signed)
Complains of cough since Monday 5/14.  Complains of headache and chest soreness and abdominal soreness with cough.

## 2016-12-06 LAB — CULTURE, GROUP A STREP (THRC)

## 2016-12-26 ENCOUNTER — Encounter (HOSPITAL_COMMUNITY): Payer: Self-pay | Admitting: Emergency Medicine

## 2016-12-26 ENCOUNTER — Ambulatory Visit (HOSPITAL_COMMUNITY)
Admission: EM | Admit: 2016-12-26 | Discharge: 2016-12-26 | Disposition: A | Discharge status: Home / Self Care | Payer: Medicaid Other | Attending: Internal Medicine | Admitting: Internal Medicine

## 2016-12-26 DIAGNOSIS — M778 Other enthesopathies, not elsewhere classified: Secondary | ICD-10-CM

## 2016-12-26 DIAGNOSIS — M779 Enthesopathy, unspecified: Principal | ICD-10-CM

## 2016-12-26 DIAGNOSIS — M65841 Other synovitis and tenosynovitis, right hand: Secondary | ICD-10-CM

## 2016-12-26 MED ORDER — NAPROXEN 500 MG PO TABS: 500.0000 mg | ORAL_TABLET | Freq: Two times a day (BID) | ORAL | 0 refills | Status: DC

## 2016-12-26 NOTE — ED Triage Notes (Signed)
Pt here for right hand pain onset 4 days .... Denies inj/trauma  Pain increases when making a fist... Reports she was at work picking up ice bucket but could not   A&O x4... NAD... Ambulatory

## 2016-12-26 NOTE — Discharge Instructions (Addendum)
Wear splint when you are up and around, to rest hand/wrist and help it heal.  Prescription for naproxen (anti inflammatory/pain reliever) sent to the pharmacy.  Ice for 5-10 minutes several times daily may help decrease pain.  Anticipate gradual improvement over the next week or two.  Followup with sports med or orthopedic provider if not improving as expected.

## 2016-12-26 NOTE — ED Provider Notes (Signed)
[facility]    CSN: [id number] Arrival date & time: 12/26/16  1749     History   Chief Complaint Chief Complaint  Patient presents with  . Hand Pain    HPI Leah [name] is a 21 y.o. female. She presents today with 4 day history of discomfort with right hand/wrist movements, increasing. This is fairly constant, but is worse with certain hand and wrist motions. It is worse with texting, hand grasping, wrist rotation. Sometimes it keeps her from sleeping. She has put some ice on it today, which helped a little bit. She has not taken anything for this. No trauma, no unusual activities.    HPI  Past Medical History:  Diagnosis Date  . Anemia 2013  . Anxiety 2011   no meds during pregnancy  . Bipolar 1 disorder (HCC)   . Chlamydia   . Gall stones   . GERD (gastroesophageal reflux disease)   . 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 Seizure [facility](HCC) 2011   pseudoseizures    Patient Active Problem List   Diagnosis Date Noted  . 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 UTI (urinary tract infection) 08/19/2016  . Suicide attempt by drug ingestion (HCC)   . Contraception 03/10/2014  . Biliary colic 03/10/2014  . BV (bacterial vaginosis) 03/10/2014  . Depression 03/16/2013  . Allergic rhinitis, seasonal 01/26/2013  . Anemia, iron deficiency 12/01/2012    Past Surgical History:  Procedure Laterality Date  . CHOLECYSTECTOMY N/A 09/28/2014   Procedure: LAPAROSCOPIC CHOLECYSTECTOMY;  Surgeon: Axel FillerArmando Ramirez, MD;  Location: [facility] OR;  Service: General;  Laterality: N/A;  . CHOLECYSTECTOMY    . DIRECT LARYNGOSCOPY N/A 04/24/2014   Procedure: DIRECT LARYNGOSCOPY;  Surgeon: Darletta MollSui W Teoh, MD;  [facility];  Service: ENT;  Laterality: N/A;  . FOREIGN BODY REMOVAL ESOPHAGEAL N/A 04/24/2014   Procedure: REMOVAL FOREIGN BODY ESOPHAGEAL;  Surgeon: Darletta MollSui W Teoh, MD;  [facility];  Service: ENT;  Laterality: N/A;   . WISDOM TOOTH EXTRACTION  2013    OB History    Gravida Para Term Preterm AB Living   1 1 1  0 0 1   SAB TAB Ectopic Multiple Live Births   0 0 0   1       Home Medications    Prior to Admission medications   Medication Sig Start Date End Date Taking? Authorizing Provider  lamoTRIgine (LAMICTAL) 25 MG tablet Take 1 tablet (25 mg total) by mouth at bedtime. 08/23/16  Yes Pucilowska, Jolanta B, MD  mirtazapine (REMERON) 15 MG tablet Take 1 tablet (15 mg total) by mouth at bedtime. 08/23/16  Yes Pucilowska, Jolanta B, MD  risperiDONE (RISPERDAL) 2 MG tablet Take 1 tablet (2 mg total) by mouth at bedtime. 08/23/16  Yes Pucilowska, Jolanta B, MD  azithromycin (ZITHROMAX) 250 MG tablet Take 1 tablet (250 mg total) by mouth daily. Take first 2 tablets together, then 1 every day until finished. 12/05/16   Deatra Canterxford, William J, FNP  benzonatate (TESSALON) 100 MG capsule Take 1 capsule (100 mg total) by mouth every 8 (eight) hours. 12/03/16   Dorena BodoKennard, Lawrence, NP  levonorgestrel (MIRENA) 20 MCG/24HR IUD 1 each by Intrauterine route once. Placed in 09/2015    [provider]  magic mouthwash w/lidocaine SOLN Take 5 mLs by mouth 3 (three) times daily as needed for mouth pain. 12/03/16   Dorena BodoKennard, Lawrence, NP  methylPREDNISolone (MEDROL DOSEPAK)  4 MG TBPK tablet Take 6-5-4-3-2-1 po qd 12/05/16   Deatra Canter, FNP  naproxen (NAPROSYN) 500 MG tablet Take 1 tablet (500 mg total) by mouth 2 (two) times daily. 12/26/16   Eustace Moore, MD  pantoprazole (PROTONIX) 40 MG tablet Take 1 tablet (40 mg total) by mouth daily. 08/23/16   Shari Prows, MD    Family History Family History  Problem Relation Age of Onset  . Arthritis Mother   . Bronchitis Mother   . Asthma Mother   . Hearing loss Paternal Grandfather   . Cancer Neg Hx   . Diabetes Neg Hx     Social History Social History  Substance Use Topics  . Smoking status: Never Smoker  . Smokeless tobacco: Never Used  . Alcohol use No       Allergies   Patient has no known allergies.   Review of Systems Review of Systems  All other systems reviewed and are negative.    Physical Exam Triage Vital Signs ED Triage Vitals [12/26/16 1853]  Enc Vitals Group     BP 113/67     Pulse Rate 85     Resp 20     Temp 98.5 F (36.9 C)     Temp Source Oral     SpO2 100 %     Weight      Height      Pain Score 8     Pain Loc    Updated Vital Signs BP 113/67 (BP Location: Right Arm)   Pulse 85   Temp 98.5 F (36.9 C) (Oral)   Resp 20   SpO2 100%   Physical Exam  Constitutional: She is oriented to person, place, and time. No distress.  HENT:  Head: Atraumatic.  Eyes:  Conjugate gaze observed, no eye redness/discharge  Neck: Neck supple.  Cardiovascular: Normal rate.   Pulmonary/Chest: No respiratory distress.  Abdominal: She exhibits no distension.  Musculoskeletal: Normal range of motion.  Wrists are symmetric, good range of motion but painful on the right. No warmth/effusion/redness. No bruising. No focal tenderness. Pain with thumb extension. Pain radiates from the base of the right thumb, over the dorsal radial wrist and towards the extensor bundle attachment in the elbow  Neurological: She is alert and oriented to person, place, and time.  Skin: Skin is warm and dry.  Nursing note and vitals reviewed.    UC Treatments / Results   Procedures Procedures (including critical care time) None today  Final Clinical Impressions(s) / UC Diagnoses   Final diagnoses:  Tendinitis of extensor tendon of right hand   Wear splint when you are up and around, to rest hand/wrist and help it heal.  Prescription for naproxen (anti inflammatory/pain reliever) sent to the pharmacy.  Ice for 5-10 minutes several times daily may help decrease pain.  Anticipate gradual improvement over the next week or two.  Followup with sports med or orthopedic provider if not improving as expected.    New Prescriptions New  Prescriptions   NAPROXEN (NAPROSYN) 500 MG TABLET    Take 1 tablet (500 mg total) by mouth 2 (two) times daily.     Eustace Moore, MD 12/28/16 1004

## 2016-12-26 NOTE — ED Notes (Signed)
Bed: UC01 Expected date:  Expected time:  Means of arrival:  Comments: Hold

## 2017-01-23 ENCOUNTER — Encounter (HOSPITAL_COMMUNITY): Payer: Self-pay | Admitting: Emergency Medicine

## 2017-01-23 ENCOUNTER — Ambulatory Visit (HOSPITAL_COMMUNITY)
Admission: EM | Admit: 2017-01-23 | Discharge: 2017-01-23 | Disposition: A | Discharge status: Home / Self Care | Payer: Medicaid Other | Attending: Family Medicine | Admitting: Family Medicine

## 2017-01-23 DIAGNOSIS — J209 Acute bronchitis, unspecified: Secondary | ICD-10-CM

## 2017-01-23 DIAGNOSIS — J40 Bronchitis, not specified as acute or chronic: Secondary | ICD-10-CM

## 2017-01-23 DIAGNOSIS — R1032 Left lower quadrant pain: Secondary | ICD-10-CM

## 2017-01-23 DIAGNOSIS — Z3202 Encounter for pregnancy test, result negative: Secondary | ICD-10-CM

## 2017-01-23 LAB — POCT PREGNANCY, URINE: Preg Test, Ur: NEGATIVE

## 2017-01-23 LAB — POCT URINALYSIS DIP (DEVICE)
BILIRUBIN URINE: NEGATIVE
Glucose, UA: NEGATIVE mg/dL
Hgb urine dipstick: NEGATIVE
KETONES UR: 15 mg/dL — AB
Leukocytes, UA: NEGATIVE
Nitrite: NEGATIVE
PH: 6 (ref 5.0–8.0)
Protein, ur: 30 mg/dL — AB
Specific Gravity, Urine: >=1.03 (ref 1.005–1.030)
Urobilinogen, UA: 1 mg/dL (ref 0.0–1.0)

## 2017-01-23 MED ORDER — DICYCLOMINE HCL 20 MG PO TABS: 20.0000 mg | ORAL_TABLET | Freq: Two times a day (BID) | ORAL | 0 refills | Status: DC

## 2017-01-23 MED ORDER — AZITHROMYCIN 250 MG PO TABS: 250.0000 mg | ORAL_TABLET | Freq: Every day | ORAL | 0 refills | Status: DC

## 2017-01-23 MED ORDER — ONDANSETRON 4 MG PO TBDP: 4.0000 mg | ORAL_TABLET | Freq: Three times a day (TID) | ORAL | 0 refills | Status: DC | PRN

## 2017-01-23 MED ORDER — BENZONATATE 100 MG PO CAPS: 100.0000 mg | ORAL_CAPSULE | Freq: Three times a day (TID) | ORAL | 0 refills | Status: DC

## 2017-01-23 NOTE — ED Provider Notes (Signed)
CSN: [id number]     Arrival date & time 01/23/17  1443 History   First MD Initiated Contact with Patient 01/23/17 1537     Chief Complaint  Patient presents with  . URI  . Abdominal Pain   (Consider location/radiation/quality/duration/timing/severity/associated sxs/prior Treatment) The history is provided by the patient.  URI  Presenting symptoms: cough   Presenting symptoms: no congestion and no fever   Severity:  Moderate Onset quality:  Gradual Duration:  10 days Timing:  Constant Progression:  Unchanged Chronicity:  Recurrent Relieved by:  Nothing Worsened by:  Nothing Ineffective treatments:  None tried Associated symptoms: no arthralgias, no headaches, no myalgias, no neck pain, no sinus pain, no swollen glands and no wheezing   Abdominal Pain  Pain location:  LLQ Pain quality: aching and cramping   Pain radiates to:  Does not radiate Pain severity:  Moderate Onset quality:  Gradual Duration:  10 days Timing:  Constant Progression:  Unchanged Chronicity:  New Context: not recent travel, not sick contacts and not suspicious food intake   Relieved by:  Nothing Worsened by:  Nothing Ineffective treatments:  None tried Associated symptoms: cough, diarrhea and nausea   Associated symptoms: no chest pain, no chills, no fever and no vomiting   Risk factors: not pregnant     Past Medical History:  Diagnosis Date  . Anemia 2013  . Anxiety 2011   no meds during pregnancy  . Bipolar 1 disorder (HCC)   . Chlamydia   . Gall stones   . GERD (gastroesophageal reflux disease)   . 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 Seizure [facility](HCC) 2011   pseudoseizures   Past Surgical History:  Procedure Laterality Date  . CHOLECYSTECTOMY N/A 09/28/2014   Procedure: LAPAROSCOPIC CHOLECYSTECTOMY;  Surgeon: Axel FillerArmando Ramirez, MD;  Location: [facility] OR;  Service: General;  Laterality: N/A;  . CHOLECYSTECTOMY    . DIRECT LARYNGOSCOPY N/A 04/24/2014   Procedure: DIRECT  LARYNGOSCOPY;  Surgeon: Darletta MollSui W Teoh, MD;  [facility];  Service: ENT;  Laterality: N/A;  . FOREIGN BODY REMOVAL ESOPHAGEAL N/A 04/24/2014   Procedure: REMOVAL FOREIGN BODY ESOPHAGEAL;  Surgeon: Darletta MollSui W Teoh, MD;  [facility];  Service: ENT;  Laterality: N/A;  . WISDOM TOOTH EXTRACTION  2013   Family History  Problem Relation Age of Onset  . Arthritis Mother   . Bronchitis Mother   . Asthma Mother   . Hearing loss Paternal Grandfather   . Cancer Neg Hx   . Diabetes Neg Hx    Social History  Substance Use Topics  . Smoking status: Never Smoker  . Smokeless tobacco: Never Used  . Alcohol use No   OB History    Gravida Para Term Preterm AB Living   1 1 1  0 0 1   SAB TAB Ectopic Multiple Live Births   0 0 0   1     Review of Systems  Constitutional: Negative for chills and fever.  HENT: Negative for congestion, sinus pain and sinus pressure.   Respiratory: Positive for cough. Negative for wheezing.   Cardiovascular: Negative for chest pain and palpitations.  Gastrointestinal: Positive for abdominal pain, diarrhea and nausea. Negative for vomiting.  Musculoskeletal: Negative for arthralgias, myalgias and neck pain.  Skin: Negative.   Neurological: Negative for light-headedness and headaches.    Allergies  Patient has no known allergies.  Home Medications   Prior to Admission medications   Medication Sig Start Date  End Date Taking? Authorizing Provider  azithromycin (ZITHROMAX) 250 MG tablet Take 1 tablet (250 mg total) by mouth daily. Take first 2 tablets together, then 1 every day until finished. 01/23/17   Dorena Bodo, NP  benzonatate (TESSALON) 100 MG capsule Take 1 capsule (100 mg total) by mouth every 8 (eight) hours. 01/23/17   Dorena Bodo, NP  dicyclomine (BENTYL) 20 MG tablet Take 1 tablet (20 mg total) by mouth 2 (two) times daily. 01/23/17   Dorena Bodo, NP  ondansetron (ZOFRAN ODT) 4 MG disintegrating tablet Take 1 tablet (4 mg total) by mouth  every 8 (eight) hours as needed for nausea or vomiting. 01/23/17   Dorena Bodo, NP   Meds Ordered and Administered this Visit  Medications - No data to display  BP 97/73 (BP Location: Left Arm)   Pulse 88   Temp 97.8 F (36.6 C) (Oral)   Resp 16   SpO2 100%  No data found.   Physical Exam  Constitutional: She is oriented to person, place, and time. She appears well-developed and well-nourished. No distress.  HENT:  Head: Normocephalic and atraumatic.  Right Ear: Tympanic membrane and external ear normal.  Left Ear: Tympanic membrane and external ear normal.  Mouth/Throat: Oropharynx is clear and moist.  Eyes: Conjunctivae are normal.  Cardiovascular: Normal rate and regular rhythm.   Pulmonary/Chest: Effort normal and breath sounds normal.  Abdominal: Soft. Normal appearance and bowel sounds are normal. There is tenderness in the left lower quadrant. There is no rebound and no CVA tenderness.  Neurological: She is alert and oriented to person, place, and time.  Skin: Skin is warm and dry. Capillary refill takes less than 2 seconds. No rash noted. She is not diaphoretic. No erythema.  Psychiatric: She has a normal mood and affect. Her behavior is normal.  Nursing note and vitals reviewed.   Urgent Care Course     Procedures (including critical care time)  Labs Review Labs Reviewed  POCT URINALYSIS DIP (DEVICE) - Abnormal; Notable for the following:       Result Value   Ketones, ur 15 (*)    Protein, ur 30 (*)    All other components within normal limits  POCT PREGNANCY, URINE    Imaging Review No results found.    MDM   1. Left lower quadrant pain   2. Bronchitis    For bronchitis, treating with azithromycin, Tessalon, for nausea and diarrhea given Zofran and Bentyl. Follow-up with primary care return to clinic as needed.    Dorena Bodo, NP 01/23/17 [phone]

## 2017-01-23 NOTE — Discharge Instructions (Signed)
Take the medicines as directed, rest, plenty of fluids, return to clinic in one week as needed if symptoms persist, go to the ER if they worsen at anytime.

## 2017-01-23 NOTE — ED Triage Notes (Signed)
Patient has nausea and diarrhea, sore throat, congestion, abdominal pain.  Reports symptoms started 6/25

## 2017-03-24 ENCOUNTER — Encounter (HOSPITAL_COMMUNITY): Payer: Self-pay | Admitting: Emergency Medicine

## 2017-03-24 ENCOUNTER — Emergency Department (HOSPITAL_COMMUNITY)
Admission: EM | Admit: 2017-03-24 | Discharge: 2017-03-24 | Disposition: A | Discharge status: Home / Self Care | Payer: Medicaid Other | Attending: Emergency Medicine | Admitting: Emergency Medicine

## 2017-03-24 DIAGNOSIS — M79652 Pain in left thigh: Secondary | ICD-10-CM | POA: Insufficient documentation

## 2017-03-24 DIAGNOSIS — X501XXA Overexertion from prolonged static or awkward postures, initial encounter: Secondary | ICD-10-CM | POA: Insufficient documentation

## 2017-03-24 DIAGNOSIS — Z79899 Other long term (current) drug therapy: Secondary | ICD-10-CM | POA: Insufficient documentation

## 2017-03-24 MED ORDER — LIDOCAINE 5 % EX PTCH: 1.0000 | MEDICATED_PATCH | CUTANEOUS | 0 refills | Status: AC

## 2017-03-24 NOTE — ED Triage Notes (Signed)
Pt states that she has had L sided thigh pain x 3 days. States she stands a lot when she works. Alert and oriented.

## 2017-03-24 NOTE — ED Provider Notes (Signed)
[facility]-EMERGENCY DEPT Provider Note   CSN: [id number] Arrival date & time: 03/24/17  1031     History   Chief Complaint Chief Complaint  Patient presents with  . Leg Pain    HPI Leah [name] is a 21 y.o. female presents to the ED for evaluation of atraumatic left thigh pain 3 days. Pain is constant and feels like a tight band around her thigh. Exacerbated by weightbearing and palpation. Not alleviated by ibuprofen or Tylenol or heating pad. No trauma or falls. States the day before symptoms started she worked both of her jobs and was constantly on her feet. Denies history of DVT/PE, estrogen use, prolonged travel or bed rest. No other complaints.  HPI  Past Medical History:  Diagnosis Date  . Anemia 2013  . Anxiety 2011   no meds during pregnancy  . Bipolar 1 disorder (HCC)   . Chlamydia   . Gall stones   . GERD (gastroesophageal reflux disease)   . 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 Seizure [facility]) 2011   pseudoseizures    Patient Active Problem List   Diagnosis Date Noted  . 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 UTI (urinary tract infection) 08/19/2016  . Suicide attempt by drug ingestion (HCC)   . Contraception 03/10/2014  . Biliary colic 03/10/2014  . BV (bacterial vaginosis) 03/10/2014  . Depression 03/16/2013  . Allergic rhinitis, seasonal 01/26/2013  . Anemia, iron deficiency 12/01/2012    Past Surgical History:  Procedure Laterality Date  . CHOLECYSTECTOMY N/A 09/28/2014   Procedure: LAPAROSCOPIC CHOLECYSTECTOMY;  Surgeon: Axel Filler, MD;  Location: [facility] OR;  Service: General;  Laterality: N/A;  . CHOLECYSTECTOMY    . DIRECT LARYNGOSCOPY N/A 04/24/2014   Procedure: DIRECT LARYNGOSCOPY;  Surgeon: [name], MD;  Location: [facility] OR;  Service: ENT;  Laterality: N/A;  . FOREIGN BODY REMOVAL ESOPHAGEAL N/A 04/24/2014   Procedure: REMOVAL FOREIGN BODY ESOPHAGEAL;  Surgeon: [name], MD;   Location: [facility] OR;  Service: ENT;  Laterality: N/A;  . WISDOM TOOTH EXTRACTION  2013    OB History    Gravida Para Term Preterm AB Living   1 1 1  0 0 1   SAB TAB Ectopic Multiple Live Births   0 0 0   1       Home Medications    Prior to Admission medications   Medication Sig Start Date End Date Taking? Authorizing Provider  azithromycin (ZITHROMAX) 250 MG tablet Take 1 tablet (250 mg total) by mouth daily. Take first 2 tablets together, then 1 every day until finished. 01/23/17   Dorena Bodo, NP  benzonatate (TESSALON) 100 MG capsule Take 1 capsule (100 mg total) by mouth every 8 (eight) hours. 01/23/17   Dorena Bodo, NP  dicyclomine (BENTYL) 20 MG tablet Take 1 tablet (20 mg total) by mouth 2 (two) times daily. 01/23/17   Dorena Bodo, NP  lidocaine (LIDODERM) 5 % Place 1 patch onto the skin daily. Apply to left thigh. Change every 24 hours. 03/24/17 03/27/17  Liberty Handy, PA-C  ondansetron (ZOFRAN ODT) 4 MG disintegrating tablet Take 1 tablet (4 mg total) by mouth every 8 (eight) hours as needed for nausea or vomiting. 01/23/17   Dorena Bodo, NP    Family History Family History  Problem Relation Age of Onset  . Arthritis Mother   . Bronchitis Mother   . Asthma Mother   . Hearing  loss Paternal Grandfather   . Cancer Neg Hx   . Diabetes Neg Hx     Social History Social History  Substance Use Topics  . Smoking status: Never Smoker  . Smokeless tobacco: Never Used  . Alcohol use No     Allergies   Patient has no known allergies.   Review of Systems Review of Systems  Constitutional: Negative for fever.  Musculoskeletal: Positive for gait problem and myalgias. Negative for arthralgias.  Skin: Negative for color change.     Physical Exam Updated Vital Signs BP 117/81 (BP Location: Left Arm)   Pulse 73   Temp 98.6 F (37 C) (Oral)   Resp 18   Ht 5' (1.524 m)   Wt 48.1 kg (106 lb)   SpO2 98%   BMI 20.70 kg/m   Physical Exam    Constitutional: She is oriented to person, place, and time. She appears well-developed and well-nourished. No distress.  NAD.  HENT:  Head: Normocephalic and atraumatic.  Right Ear: External ear normal.  Left Ear: External ear normal.  Nose: Nose normal.  Eyes: Conjunctivae and EOM are normal. No scleral icterus.  Neck: Normal range of motion.  Cardiovascular: Normal rate, regular rhythm and normal heart sounds.   No murmur heard. No asymmetric lower extremity edema DP and popliteal pulses 2+ bilaterally  Pulmonary/Chest: Effort normal and breath sounds normal. She has no wheezes.  Musculoskeletal: Normal range of motion. She exhibits tenderness. She exhibits no deformity.  Pain around left upper thigh, no overlaying skin changes or fluctuance. Left thigh and calf supple. Full PROM of bilateral hips and knees Pain reported with active hip flexion and knee flexion Non tender hip and knee on left Antalgic gait favoring left side   Neurological: She is alert and oriented to person, place, and time.  5/5 strength with hip, knee and ankle flexion and extension Sensation to light touch intact in lower extremities   Skin: Skin is warm and dry. Capillary refill takes less than 2 seconds.  Psychiatric: She has a normal mood and affect. Her behavior is normal. Judgment and thought content normal.  Nursing note and vitals reviewed.    ED Treatments / Results  Labs (all labs ordered are listed, but only abnormal results are displayed) Labs Reviewed - No data to display  EKG  EKG Interpretation None       Radiology No results found.  Procedures Procedures (including critical care time)  Medications Ordered in ED Medications - No data to display   Initial Impression / Assessment and Plan / ED Course  I have reviewed the triage vital signs and the nursing notes.  Pertinent labs & imaging results that were available during my care of the patient were reviewed by me and  considered in my medical decision making (see chart for details).    21 year old female presents with atraumatic left thigh pain. Constant, nonradiating. Exam is reassuring without overlying skin changes, erythema, edema, fluctuance or warmth. Proximal joints are normal and nontender. Extremities neurovascularly intact.  Wells' criteria = 0 for DVT. Will d/c with symptomatic tx. She requested work note, given. Discussed ED return precautions, she verbalized understanding and agreeable w/ plan.  Final Clinical Impressions(s) / ED Diagnoses   Final diagnoses:  Pain of left thigh    New Prescriptions New Prescriptions   LIDOCAINE (LIDODERM) 5 %    Place 1 patch onto the skin daily. Apply to left thigh. Change every 24 hours.     Sharen HeckGibbons, Shar Paez  J, PA-C 03/24/17 1249    Lorre Nick, MD 03/27/17 1409

## 2017-03-24 NOTE — Discharge Instructions (Signed)
You were evaluated in the ED for left thigh pain without previous trauma. I suspect you may have a muscle injury. Please alternate ibuprofen and Tylenol every 6-8 hours for pain. Apply a heating pad to the area. Use lidocaine patches for the next 3 days. Mild stretches may also help loosen up muscles. Return to the ED per symptoms worsen.

## 2017-04-07 ENCOUNTER — Encounter (HOSPITAL_COMMUNITY): Payer: Self-pay

## 2017-04-07 ENCOUNTER — Emergency Department (HOSPITAL_COMMUNITY)
Admission: EM | Admit: 2017-04-07 | Discharge: 2017-04-07 | Disposition: A | Discharge status: Home / Self Care | Payer: Medicaid Other | Attending: Emergency Medicine | Admitting: Emergency Medicine

## 2017-04-07 DIAGNOSIS — R1030 Lower abdominal pain, unspecified: Secondary | ICD-10-CM | POA: Diagnosis present

## 2017-04-07 DIAGNOSIS — B9689 Other specified bacterial agents as the cause of diseases classified elsewhere: Secondary | ICD-10-CM | POA: Diagnosis not present

## 2017-04-07 DIAGNOSIS — N760 Acute vaginitis: Secondary | ICD-10-CM | POA: Diagnosis not present

## 2017-04-07 DIAGNOSIS — Z79899 Other long term (current) drug therapy: Secondary | ICD-10-CM | POA: Insufficient documentation

## 2017-04-07 LAB — URINALYSIS, ROUTINE W REFLEX MICROSCOPIC
BILIRUBIN URINE: NEGATIVE
GLUCOSE, UA: NEGATIVE mg/dL
HGB URINE DIPSTICK: NEGATIVE
Ketones, ur: NEGATIVE mg/dL
NITRITE: NEGATIVE
PROTEIN: NEGATIVE mg/dL
SPECIFIC GRAVITY, URINE: 1.03 (ref 1.005–1.030)
pH: 6 (ref 5.0–8.0)

## 2017-04-07 LAB — WET PREP, GENITAL
Sperm: NONE SEEN
Trich, Wet Prep: NONE SEEN
Yeast Wet Prep HPF POC: NONE SEEN

## 2017-04-07 LAB — POC URINE PREG, ED: PREG TEST UR: NEGATIVE

## 2017-04-07 MED ORDER — METRONIDAZOLE 500 MG PO TABS
500.0000 mg | ORAL_TABLET | Freq: Once | ORAL | Status: AC
  Administered 2017-04-07: 500 mg via ORAL
  Filled 2017-04-07: qty 1

## 2017-04-07 MED ORDER — ONDANSETRON 4 MG PO TBDP
4.0000 mg | ORAL_TABLET | Freq: Once | ORAL | Status: AC
  Administered 2017-04-07: 4 mg via ORAL
  Filled 2017-04-07: qty 1

## 2017-04-07 MED ORDER — KETOROLAC TROMETHAMINE 30 MG/ML IJ SOLN
30.0000 mg | Freq: Once | INTRAMUSCULAR | Status: AC
  Administered 2017-04-07: 30 mg via INTRAMUSCULAR
  Filled 2017-04-07: qty 1

## 2017-04-07 MED ORDER — ONDANSETRON HCL 4 MG PO TABS: 4.0000 mg | ORAL_TABLET | Freq: Four times a day (QID) | ORAL | 0 refills | Status: DC

## 2017-04-07 MED ORDER — IBUPROFEN 600 MG PO TABS: 600.0000 mg | ORAL_TABLET | Freq: Four times a day (QID) | ORAL | 0 refills | Status: DC | PRN

## 2017-04-07 MED ORDER — METRONIDAZOLE 500 MG PO TABS: 500.0000 mg | ORAL_TABLET | Freq: Two times a day (BID) | ORAL | 0 refills | Status: DC

## 2017-04-07 NOTE — Discharge Instructions (Signed)
Please read and follow all provided instructions.  Your diagnoses today include:  1. BV (bacterial vaginosis)     Tests performed today include: Vital signs. See below for your results today.   Medications prescribed:  Take as prescribed   Home care instructions:  Follow any educational materials contained in this packet.  Follow-up instructions: Please follow-up with your primary care provider for further evaluation of symptoms and treatment   Return instructions:  Please return to the Emergency Department if you do not get better, if you get worse, or new symptoms OR  - Fever (temperature greater than 101.21F)  - Bleeding that does not stop with holding pressure to the area    -Severe pain (please note that you may be more sore the day after your accident)  - Chest Pain  - Difficulty breathing  - Severe nausea or vomiting  - Inability to tolerate food and liquids  - Passing out  - Skin becoming red around your wounds  - Change in mental status (confusion or lethargy)  - New numbness or weakness    Please return if you have any other emergent concerns.  Additional Information:  Your vital signs today were: BP 103/72 (BP Location: Left Arm)    Pulse 69    Temp 99.1 F (37.3 C) (Oral)    Resp 18    Ht 5' (1.524 m)    Wt 48.1 kg (106 lb)    SpO2 100%    BMI 20.70 kg/m  If your blood pressure (BP) was elevated above 135/85 this visit, please have this repeated by your doctor within one month. ---------------

## 2017-04-07 NOTE — ED Provider Notes (Signed)
[facility]-EMERGENCY DEPT Provider Note   CSN: [id number] Arrival date & time: 04/07/17  1518     History   Chief Complaint Chief Complaint  Patient presents with  . Abdominal Pain  . Dysuria    HPI Leah [name] is a 21 y.o. female.  HPI  21 y.o. female, presents to the Emergency Department today due to bilateral lower abdominal pain this AM. Notes pressure with urination. Denies dysuria. Denies fevers. No vaginal bleeding/discharge. Notes pain has been constant since this morning. Associated nausea without emesis. Normal BMs. No diarrhea. No hematochezia. LMP x 5 years ago. Pt is on Depo. Sexually with one partner. No CP/SOB. Rates pain 4/10. Pressure sensation. No meds PTA. No other symptoms noted.   Past Medical History:  Diagnosis Date  . Anemia 2013  . Anxiety 2011   no meds during pregnancy  . Bipolar 1 disorder (HCC)   . Chlamydia   . Gall stones   . GERD (gastroesophageal reflux disease)   . 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 Seizure [facility]) 2011   pseudoseizures    Patient Active Problem List   Diagnosis Date Noted  . 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 UTI (urinary tract infection) 08/19/2016  . Suicide attempt by drug ingestion (HCC)   . Contraception 03/10/2014  . Biliary colic 03/10/2014  . BV (bacterial vaginosis) 03/10/2014  . Depression 03/16/2013  . Allergic rhinitis, seasonal 01/26/2013  . Anemia, iron deficiency 12/01/2012    Past Surgical History:  Procedure Laterality Date  . CHOLECYSTECTOMY N/A 09/28/2014   Procedure: LAPAROSCOPIC CHOLECYSTECTOMY;  Surgeon: Axel Filler, MD;  Location: [facility] OR;  Service: General;  Laterality: N/A;  . CHOLECYSTECTOMY    . DIRECT LARYNGOSCOPY N/A 04/24/2014   Procedure: DIRECT LARYNGOSCOPY;  Surgeon: [name], MD;  Location: [facility] OR;  Service: ENT;  Laterality: N/A;  . FOREIGN BODY REMOVAL ESOPHAGEAL N/A 04/24/2014   Procedure: REMOVAL  FOREIGN BODY ESOPHAGEAL;  Surgeon: [name], MD;  Location: [facility] OR;  Service: ENT;  Laterality: N/A;  . WISDOM TOOTH EXTRACTION  2013    OB History    Gravida Para Term Preterm AB Living   0 0 1   SAB TAB Ectopic Multiple Live Births   0 0 0   1       Home Medications    Prior to Admission medications   Medication Sig Start Date End Date Taking? Authorizing Provider  azithromycin (ZITHROMAX) 250 MG tablet Take 1 tablet (250 mg total) by mouth daily. Take first 2 tablets together, then 1 every day until finished. 01/23/17   Dorena Bodo, NP  benzonatate (TESSALON) 100 MG capsule Take 1 capsule (100 mg total) by mouth every 8 (eight) hours. 01/23/17   Dorena Bodo, NP  dicyclomine (BENTYL) 20 MG tablet Take 1 tablet (20 mg total) by mouth 2 (two) times daily. 01/23/17   Dorena Bodo, NP  ondansetron (ZOFRAN ODT) 4 MG disintegrating tablet Take 1 tablet (4 mg total) by mouth every 8 (eight) hours as needed for nausea or vomiting. 01/23/17   Dorena Bodo, NP    Family History Family History  Problem Relation Age of Onset  . Arthritis Mother   . Bronchitis Mother   . Asthma Mother   . Hearing loss Paternal Grandfather   . Cancer Neg Hx   . Diabetes Neg Hx     Social History Social History  Substance Use Topics  . Smoking status: Never Smoker  . Smokeless tobacco: Never Used  . Alcohol use No     Allergies   Patient has no known allergies.   Review of Systems Review of Systems ROS reviewed and all are negative for acute change except as noted in the HPI.  Physical Exam Updated Vital Signs BP 109/64 (BP Location: Left Arm)   Pulse 68   Temp (!) 97.5 F (36.4 C) (Oral)   Resp 16   Ht 5' (1.524 m)   Wt 48.1 kg (106 lb)   SpO2 100%   BMI 20.70 kg/m   Physical Exam  Constitutional: She is oriented to person, place, and time. Vital signs are normal. She appears well-developed and well-nourished.  HENT:  Head: Normocephalic and atraumatic.    Right Ear: Hearing normal.  Left Ear: Hearing normal.  Eyes: Pupils are equal, round, and reactive to light. Conjunctivae and EOM are normal.  Neck: Normal range of motion. Neck supple.  Cardiovascular: Normal rate, regular rhythm, normal heart sounds and intact distal pulses.   Pulmonary/Chest: Effort normal and breath sounds normal.  Abdominal: Soft. Bowel sounds are normal. There is tenderness in the suprapubic area. There is no rigidity, no rebound, no guarding, no CVA tenderness, no tenderness at McBurney's point and negative Murphy's sign.  Musculoskeletal: Normal range of motion.  Neurological: She is alert and oriented to person, place, and time.  Skin: Skin is warm and dry.  Psychiatric: She has a normal mood and affect. Her speech is normal and behavior is normal. Thought content normal.  Nursing note and vitals reviewed.  Exam performed by Eston Esters,  exam chaperoned Date: 04/07/2017 Pelvic exam: normal external genitalia without evidence of trauma. VULVA: normal appearing vulva with no masses, tenderness or lesion. VAGINA: normal appearing vagina with normal color and discharge, no lesions. CERVIX: normal appearing cervix without lesions, cervical motion tenderness absent, cervical os closed with out purulent discharge; vaginal discharge - clear, creamy and malodorous, Wet prep and DNA probe for chlamydia and GC obtained.   ADNEXA: normal adnexa in size, nontender and no masses UTERUS: uterus is normal size, shape, consistency and nontender.   ED Treatments / Results  Labs (all labs ordered are listed, but only abnormal results are displayed) Labs Reviewed  WET PREP, GENITAL - Abnormal; Notable for the following:       Result Value   Clue Cells Wet Prep HPF POC PRESENT (*)    WBC, Wet Prep HPF POC FEW (*)    All other components within normal limits  URINALYSIS, ROUTINE W REFLEX MICROSCOPIC - Abnormal; Notable for the following:    APPearance HAZY (*)    Leukocytes,  UA TRACE (*)    Bacteria, UA FEW (*)    Squamous Epithelial / LPF 6-30 (*)    All other components within normal limits  POC URINE PREG, ED  GC/CHLAMYDIA PROBE AMP ([place]) NOT AT [facility]    EKG  EKG Interpretation None       Radiology No results found.  Procedures Procedures (including critical care time)  Medications Ordered in ED Medications  ondansetron (ZOFRAN-ODT) disintegrating tablet 4 mg (not administered)  metroNIDAZOLE (FLAGYL) tablet 500 mg (not administered)  ketorolac (TORADOL) 30 MG/ML injection 30 mg (30 mg Intramuscular Given 04/07/17 2109)     Initial Impression / Assessment and Plan / ED Course  I have reviewed the triage vital signs and the nursing notes.  Pertinent labs & imaging results  that were available during my care of the patient were reviewed by me and considered in my medical decision making (see chart for details).  Final Clinical Impressions(s) / ED Diagnoses  {I have reviewed and evaluated the relevant laboratory values.   {I have reviewed the relevant previous healthcare records.  {I obtained HPI from historian.   ED Course:  Assessment: Pt is a 21 y.o. female presents to the Emergency Department today due to bilateral lower abdominal pain this AM. Notes pressure with urination. Denies dysuria. Denies fevers. No vaginal bleeding/discharge. Notes pain has been constant since this morning. Associated nausea without emesis. Normal BMs. No diarrhea. No hematochezia. LMP x 5 years ago. Pt is on Depo. Sexually with one partner. No CP/SOB. Rates pain 4/10. Pressure sensation. No meds PTA. On exam, pt in NAD. Nontoxic/nonseptic appearing. VSS. Afebrile. Lungs CTA. Heart RRR. Abdomen nontender soft. GU Exam with discharge noted. No Adnexal. No CMT. Wet prep shows clue cells. Given Flagyl. GC obtained. UA unremarkable. Plan is to DC home with follow up to PCP. At time of discharge, Patient is in no acute distress. Vital Signs are stable. Patient is able  to ambulate. Patient able to tolerate PO.   Disposition/Plan:  DC Home Additional Verbal discharge instructions given and discussed with patient.  Pt Instructed to f/u with PCP in the next week for evaluation and treatment of symptoms. Return precautions given Pt acknowledges and agrees with plan  Supervising Physician Gwyneth Sprout, MD  Final diagnoses:  BV (bacterial vaginosis)    New Prescriptions New Prescriptions   No medications on file     Wilber Bihari 04/07/17 2139    Gwyneth Sprout, MD 04/13/17 [phone]

## 2017-04-07 NOTE — ED Triage Notes (Signed)
Patient c/o bilateral lower abdominal pain since waking this AM. Patient states she has urinary pressure when she urinates. Patient denies any frequency or vaginal discharge.

## 2017-04-08 LAB — GC/CHLAMYDIA PROBE AMP (~~LOC~~) NOT AT ARMC
CHLAMYDIA, DNA PROBE: NEGATIVE
Neisseria Gonorrhea: NEGATIVE

## 2017-06-08 ENCOUNTER — Emergency Department (HOSPITAL_COMMUNITY): Payer: Medicaid Other

## 2017-06-08 ENCOUNTER — Other Ambulatory Visit: Payer: Self-pay

## 2017-06-08 ENCOUNTER — Emergency Department (HOSPITAL_COMMUNITY)
Admission: EM | Admit: 2017-06-08 | Discharge: 2017-06-08 | Disposition: A | Discharge status: Home / Self Care | Payer: Medicaid Other | Attending: Emergency Medicine | Admitting: Emergency Medicine

## 2017-06-08 ENCOUNTER — Encounter (HOSPITAL_COMMUNITY): Payer: Self-pay

## 2017-06-08 DIAGNOSIS — R05 Cough: Secondary | ICD-10-CM | POA: Diagnosis present

## 2017-06-08 DIAGNOSIS — B9789 Other viral agents as the cause of diseases classified elsewhere: Secondary | ICD-10-CM | POA: Insufficient documentation

## 2017-06-08 DIAGNOSIS — Z79899 Other long term (current) drug therapy: Secondary | ICD-10-CM | POA: Insufficient documentation

## 2017-06-08 DIAGNOSIS — J069 Acute upper respiratory infection, unspecified: Secondary | ICD-10-CM

## 2017-06-08 MED ORDER — BENZONATATE 100 MG PO CAPS: 100.0000 mg | ORAL_CAPSULE | Freq: Three times a day (TID) | ORAL | 0 refills | Status: DC | PRN

## 2017-06-08 MED ORDER — AEROCHAMBER PLUS FLO-VU LARGE MISC
1.0000 | Freq: Once | Status: AC
  Administered 2017-06-08: 1

## 2017-06-08 MED ORDER — ALBUTEROL SULFATE HFA 108 (90 BASE) MCG/ACT IN AERS
INHALATION_SPRAY | RESPIRATORY_TRACT | Status: AC
  Filled 2017-06-08: qty 6.7

## 2017-06-08 MED ORDER — ALBUTEROL SULFATE (2.5 MG/3ML) 0.083% IN NEBU: 5.0000 mg | INHALATION_SOLUTION | Freq: Once | RESPIRATORY_TRACT | Status: DC

## 2017-06-08 MED ORDER — GUAIFENESIN ER 600 MG PO TB12: 1200.0000 mg | ORAL_TABLET | Freq: Two times a day (BID) | ORAL | 0 refills | Status: DC

## 2017-06-08 MED ORDER — BENZONATATE 100 MG PO CAPS
200.0000 mg | ORAL_CAPSULE | Freq: Once | ORAL | Status: AC
  Administered 2017-06-08: 200 mg via ORAL
  Filled 2017-06-08: qty 2

## 2017-06-08 MED ORDER — ALBUTEROL SULFATE (2.5 MG/3ML) 0.083% IN NEBU
5.0000 mg | INHALATION_SOLUTION | Freq: Once | RESPIRATORY_TRACT | Status: AC
  Administered 2017-06-08: 5 mg via RESPIRATORY_TRACT
  Filled 2017-06-08: qty 6

## 2017-06-08 MED ORDER — AZITHROMYCIN 250 MG PO TABS: 250.0000 mg | ORAL_TABLET | Freq: Every day | ORAL | 0 refills | Status: DC

## 2017-06-08 MED ORDER — IBUPROFEN 800 MG PO TABS
800.0000 mg | ORAL_TABLET | Freq: Once | ORAL | Status: AC
  Administered 2017-06-08: 800 mg via ORAL
  Filled 2017-06-08: qty 1

## 2017-06-08 MED ORDER — AZITHROMYCIN 250 MG PO TABS
500.0000 mg | ORAL_TABLET | Freq: Once | ORAL | Status: AC
  Administered 2017-06-08: 500 mg via ORAL
  Filled 2017-06-08: qty 2

## 2017-06-08 MED ORDER — FLUTICASONE PROPIONATE 50 MCG/ACT NA SUSP: 2.0000 | Freq: Every day | NASAL | 2 refills | Status: DC

## 2017-06-08 NOTE — ED Triage Notes (Signed)
Pt states that she has been sick for two weeks, cough getting worse, sometimes productive with yellow sputum, body aches, and vomited x 1 today after coughing spell.

## 2017-06-08 NOTE — Discharge Instructions (Signed)
1. Medications: flonase, mucinex, tessalon, albuterol, azithromycin, usual home medications 2. Treatment: rest, drink plenty of fluids, Alternate tylenol and ibuprofen for fever control and body aches 3. Follow Up: Please followup with your primary doctor in 2-3 days for discussion of your diagnoses and further evaluation after today's visit; if you do not have a primary care doctor use the resource guide provided to find one; Return to the ER for high fevers, difficulty breathing or other concerning symptoms

## 2017-06-08 NOTE — ED Provider Notes (Signed)
[facility] EMERGENCY DEPARTMENT Provider Note   CSN: [id number] Arrival date & time: 06/08/17  0054     History   Chief Complaint Chief Complaint  Patient presents with  . Cough    HPI [name] DAYLAN BOGGESS is a 21 y.o. female with a hx of EMEA, anxiety, bipolar disorder, GERD, pseudoseizure presents to the Emergency Department complaining of gradual, persistent, progressively worsening URI symptoms onset May 26, 2017. Associated symptoms include nasal congestion, subjective fevers, myalgias, postnasal drip, sore throat, cough, chest tightness.  Patient reports taking cold medication with only minimal relief.  Nothing seems to make the symptoms worse.  She denies known sick contacts.  No international travel.  No periods of immobilization, history of DVT, history of lupus, history of cancer, leg swelling or calf tenderness.  Patient reports today after a bout of significant coughing and she noticed some blood-tinged sputum.  She also denies one episode of emesis today after coughing and gagging on some sputum.  Pt denies pain, neck stiffness, rash, abdominal pain, nausea, diarrhea, weakness, dizziness, syncope. Pt did not receive an influenza vaccine.     The history is provided by the patient and medical records. No language interpreter was used.    Past Medical History:  Diagnosis Date  . Anemia 2013  . Anxiety 2011   no meds during pregnancy  . Bipolar 1 disorder (HCC)   . Chlamydia   . Gall stones   . GERD (gastroesophageal reflux disease)   . 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 Seizure [facility]) 2011   pseudoseizures    Patient Active Problem List   Diagnosis Date Noted  . 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 UTI (urinary tract infection) 08/19/2016  . Suicide attempt by drug ingestion (HCC)   . Contraception 03/10/2014  . Biliary colic 03/10/2014  . BV (bacterial vaginosis)  03/10/2014  . Depression 03/16/2013  . Allergic rhinitis, seasonal 01/26/2013  . Anemia, iron deficiency 12/01/2012    Past Surgical History:  Procedure Laterality Date  . CHOLECYSTECTOMY    . DIRECT LARYNGOSCOPY N/A 04/24/2014   Performed by [name], MD at [facility] OR  . LAPAROSCOPIC CHOLECYSTECTOMY N/A 09/28/2014   Performed by Axel Filler, MD at [facility] OR  . REMOVAL FOREIGN BODY ESOPHAGEAL N/A 04/24/2014   Performed by [name], MD at [facility] OR  . WISDOM TOOTH EXTRACTION  2013    OB History    Gravida Para Term Preterm AB Living   1 1 1  0 0 1   SAB TAB Ectopic Multiple Live Births   0 0 0   1       Home Medications    Prior to Admission medications   Medication Sig Start Date End Date Taking? Authorizing Provider  azithromycin (ZITHROMAX) 250 MG tablet Take 1 tablet (250 mg total) daily by mouth. Take first 2 tablets together, then 1 every day until finished. 06/08/17   Joie Hipps, Dahlia Client, PA-C  benzonatate (TESSALON PERLES) 100 MG capsule Take 1 capsule (100 mg total) 3 (three) times daily as needed by mouth for cough (cough). 06/08/17   Danine Hor, Dahlia Client, PA-C  fluticasone (FLONASE) 50 MCG/ACT nasal spray Place 2 sprays daily into both nostrils. 06/08/17   Jaquelyn Sakamoto, Dahlia Client, PA-C  guaiFENesin (MUCINEX) 600 MG 12 hr tablet Take 2 tablets (1,200 mg total) 2 (two) times daily by mouth. 06/08/17   Macaila Tahir, Dahlia Client, PA-C  ibuprofen (ADVIL,MOTRIN) 600  MG tablet Take 1 tablet (600 mg total) by mouth every 6 (six) hours as needed. 04/07/17   Audry PiliMohr, Tyler, PA-C  metroNIDAZOLE (FLAGYL) 500 MG tablet Take 1 tablet (500 mg total) by mouth 2 (two) times daily. 04/07/17   Audry PiliMohr, Tyler, PA-C  ondansetron (ZOFRAN) 4 MG tablet Take 1 tablet (4 mg total) by mouth every 6 (six) hours. 04/07/17   Audry PiliMohr, Tyler, PA-C    Family History Family History  Problem Relation Age of Onset  . Arthritis Mother   . Bronchitis Mother   . Asthma Mother   . Hearing loss Paternal Grandfather   . Cancer  Neg Hx   . Diabetes Neg Hx     Social History Social History   Tobacco Use  . Smoking status: Never Smoker  . Smokeless tobacco: Never Used  Substance Use Topics  . Alcohol use: No    Alcohol/week: 3.6 oz    Types: 6 Cans of beer per week  . Drug use: No     Allergies   Patient has no known allergies.   Review of Systems Review of Systems  Constitutional: Positive for fatigue. Negative for appetite change, chills and fever.  HENT: Positive for congestion, postnasal drip, rhinorrhea, sinus pressure and sore throat. Negative for ear discharge, ear pain and mouth sores.   Eyes: Negative for visual disturbance.  Respiratory: Positive for cough, chest tightness and shortness of breath. Negative for stridor.   Cardiovascular: Negative for chest pain, palpitations and leg swelling.  Gastrointestinal: Positive for vomiting (x1, post tussive). Negative for abdominal pain, diarrhea and nausea.  Genitourinary: Negative for dysuria, frequency, hematuria and urgency.  Musculoskeletal: Negative for arthralgias, back pain, myalgias and neck stiffness.  Skin: Negative for rash.  Neurological: Negative for syncope, light-headedness, numbness and headaches.  Hematological: Negative for adenopathy.  Psychiatric/Behavioral: The patient is not nervous/anxious.   All other systems reviewed and are negative.    Physical Exam Updated Vital Signs BP (!) 126/92   Pulse 100   Temp 99.7 F (37.6 C)   Resp 18   SpO2 100%   Physical Exam  Constitutional: She appears well-developed and well-nourished. No distress.  HENT:  Head: Normocephalic and atraumatic.  Right Ear: Tympanic membrane, external ear and ear canal normal.  Left Ear: Tympanic membrane, external ear and ear canal normal.  Nose: Mucosal edema and rhinorrhea present. No epistaxis. Right sinus exhibits no maxillary sinus tenderness and no frontal sinus tenderness. Left sinus exhibits no maxillary sinus tenderness and no frontal  sinus tenderness.  Mouth/Throat: Uvula is midline and mucous membranes are normal. Mucous membranes are not pale and not cyanotic. Posterior oropharyngeal erythema present. No oropharyngeal exudate, posterior oropharyngeal edema or tonsillar abscesses.  Eyes: Conjunctivae are normal. Pupils are equal, round, and reactive to light.  Neck: Normal range of motion and full passive range of motion without pain.  Cardiovascular: Normal rate and intact distal pulses.  Pulmonary/Chest: Effort normal. No accessory muscle usage or stridor. No tachypnea. She has rhonchi.  Persistent, congested cough  Abdominal: Soft. There is no tenderness.  Musculoskeletal: Normal range of motion.  Lymphadenopathy:    She has no cervical adenopathy.  Neurological: She is alert.  Skin: Skin is warm and dry. No rash noted. She is not diaphoretic.  Psychiatric: She has a normal mood and affect.  Nursing note and vitals reviewed.    ED Treatments / Results   Radiology Dg Chest 2 View  Result Date: 06/08/2017 CLINICAL DATA:  Acute onset of cough  and fever. EXAM: CHEST  2 VIEW COMPARISON:  Chest radiograph performed 08/02/2016 FINDINGS: The lungs are well-aerated. Mild peribronchial thickening is noted. There is no evidence of focal opacification, pleural effusion or pneumothorax. The heart is normal in size; the mediastinal contour is within normal limits. No acute osseous abnormalities are seen. IMPRESSION: Mild peribronchial thickening noted.  Lungs otherwise clear. Electronically Signed   By: Roanna RaiderJeffery  Chang M.D.   On: 06/08/2017 01:41    Procedures Procedures (including critical care time)  Medications Ordered in ED Medications  albuterol (PROVENTIL) (2.5 MG/3ML) 0.083% nebulizer solution 5 mg (not administered)  AEROCHAMBER PLUS FLO-VU LARGE MISC 1 each (not administered)  ibuprofen (ADVIL,MOTRIN) tablet 800 mg (800 mg Oral Given 06/08/17 0205)  albuterol (PROVENTIL) (2.5 MG/3ML) 0.083% nebulizer solution 5  mg (5 mg Nebulization Given 06/08/17 0206)  benzonatate (TESSALON) capsule 200 mg (200 mg Oral Given 06/08/17 0205)  azithromycin (ZITHROMAX) tablet 500 mg (500 mg Oral Given 06/08/17 0228)     Initial Impression / Assessment and Plan / ED Course  I have reviewed the triage vital signs and the nursing notes.  Pertinent labs & imaging results that were available during my care of the patient were reviewed by me and considered in my medical decision making (see chart for details).     Pt CXR negative for acute infiltrate. Lung exam improved after albuterol. Patients symptoms are consistent with URI, likely viral etiology. Pt with ssx greater than 10 days.  Will give azithromycin.  ! Episode pf hemoptysis likely secondary to bronchitis.  Pt is low risk for PE and I doubt this is the cause of her hemoptysis in the setting of acute bronchitis.  Pt will be discharged with symptomatic treatment.  Verbalizes understanding and is agreeable with plan. Pt is hemodynamically stable & in NAD prior to dc.  Vital signs improved.    BP (!) 117/59 (BP Location: Right Arm)   Pulse 90   Temp 99 F (37.2 C) (Oral)   Resp 18   SpO2 98%     Final Clinical Impressions(s) / ED Diagnoses   Final diagnoses:  Viral URI with cough    ED Discharge Orders        Ordered    azithromycin (ZITHROMAX) 250 MG tablet  Daily     06/08/17 0307    benzonatate (TESSALON PERLES) 100 MG capsule  3 times daily PRN     06/08/17 0307    fluticasone (FLONASE) 50 MCG/ACT nasal spray  Daily     06/08/17 0308    guaiFENesin (MUCINEX) 600 MG 12 hr tablet  2 times daily     06/08/17 0308       Halayna Blane, Dahlia ClientHannah, PA-C 06/08/17 [id number]    Zadie RhineWickline, Donald, MD 06/08/17 [phone]

## 2017-06-08 NOTE — ED Notes (Signed)
Back from xray

## 2017-07-08 ENCOUNTER — Encounter (HOSPITAL_COMMUNITY): Payer: Self-pay

## 2017-07-08 ENCOUNTER — Emergency Department (HOSPITAL_COMMUNITY)
Admission: EM | Admit: 2017-07-08 | Discharge: 2017-07-08 | Disposition: A | Discharge status: Home / Self Care | Payer: Medicaid Other | Attending: Emergency Medicine | Admitting: Emergency Medicine

## 2017-07-08 ENCOUNTER — Emergency Department (HOSPITAL_COMMUNITY): Payer: Medicaid Other

## 2017-07-08 ENCOUNTER — Other Ambulatory Visit: Payer: Self-pay

## 2017-07-08 DIAGNOSIS — M79672 Pain in left foot: Secondary | ICD-10-CM | POA: Diagnosis present

## 2017-07-08 DIAGNOSIS — L97529 Non-pressure chronic ulcer of other part of left foot with unspecified severity: Secondary | ICD-10-CM | POA: Diagnosis not present

## 2017-07-08 DIAGNOSIS — Z79899 Other long term (current) drug therapy: Secondary | ICD-10-CM | POA: Diagnosis not present

## 2017-07-08 LAB — CBG MONITORING, ED: GLUCOSE-CAPILLARY: 77 mg/dL (ref 65–99)

## 2017-07-08 MED ORDER — ACETAMINOPHEN 500 MG PO TABS
1000.0000 mg | ORAL_TABLET | Freq: Once | ORAL | Status: AC
  Administered 2017-07-08: 1000 mg via ORAL
  Filled 2017-07-08: qty 2

## 2017-07-08 MED ORDER — CEPHALEXIN 500 MG PO CAPS: 500.0000 mg | ORAL_CAPSULE | Freq: Four times a day (QID) | ORAL | 0 refills | Status: DC

## 2017-07-08 MED ORDER — MUPIROCIN CALCIUM 2 % EX CREA
TOPICAL_CREAM | Freq: Once | CUTANEOUS | Status: AC
  Administered 2017-07-08: 12:00:00 via TOPICAL
  Filled 2017-07-08: qty 15

## 2017-07-08 MED ORDER — TERBINAFINE HCL 1 % EX CREA: 1.0000 "application " | TOPICAL_CREAM | Freq: Two times a day (BID) | CUTANEOUS | 0 refills | Status: DC

## 2017-07-08 NOTE — Discharge Instructions (Signed)
Unknown exact cause of the symptoms.  May be related to a bacterial infection versus an antifungal infection.  I would recommend putting cotton balls between your toes to let the tissue dry out.  Avoid wearing socks or shoes.  Keep the cream on it as prescribed.  Also give your antibiotics to take as well.  May use over-the-counter antifungal powders to treat your feet and your shoes.  Follow-up in 2-3 days if symptoms not improving.  Have given you podiatry follow-up.  May take Motrin or Tylenol for pain.  Warm soak with epson salt.

## 2017-07-08 NOTE — ED Provider Notes (Signed)
[facility] EMERGENCY DEPARTMENT Provider Note   CSN: [id number] Arrival date & time: 07/08/17  0840     History   Chief Complaint Chief Complaint  Patient presents with  . Foot Pain    HPI Leah [name] is a 21 y.o. female.  HPI 21 year old African-American female presents to the ED for evaluation of ulcer to her left foot.  Patient states that approximately 1 week ago she noticed having a blister to the left foot under the left fourth and fifth toe.  States that 2 days ago she began having drainage from the site and experiencing pain and some associated paresthesias to the area.  States that it is difficult to move the toe itself.  Patient denies any history of diabetes.  She does report some drainage from the site.  She has been ambulatory.  Denies any systemic symptoms of fever, chills, nausea, vomiting.  No history of same.  Patient has not tried any medication at home. Past Medical History:  Diagnosis Date  . Anemia 2013  . Anxiety 2011   no meds during pregnancy  . Bipolar 1 disorder (HCC)   . Chlamydia   . Gall stones   . GERD (gastroesophageal reflux disease)   . 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 Seizure [facility]) 2011   pseudoseizures    Patient Active Problem List   Diagnosis Date Noted  . 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 UTI (urinary tract infection) 08/19/2016  . Suicide attempt by drug ingestion (HCC)   . Contraception 03/10/2014  . Biliary colic 03/10/2014  . BV (bacterial vaginosis) 03/10/2014  . Depression 03/16/2013  . Allergic rhinitis, seasonal 01/26/2013  . Anemia, iron deficiency 12/01/2012    Past Surgical History:  Procedure Laterality Date  . CHOLECYSTECTOMY N/A 09/28/2014   Procedure: LAPAROSCOPIC CHOLECYSTECTOMY;  Surgeon: Axel Filler, MD;  Location: [facility] OR;  Service: General;  Laterality: N/A;  . CHOLECYSTECTOMY    . DIRECT LARYNGOSCOPY  N/A 04/24/2014   Procedure: DIRECT LARYNGOSCOPY;  Surgeon: [name], MD;  Location: [facility] OR;  Service: ENT;  Laterality: N/A;  . FOREIGN BODY REMOVAL ESOPHAGEAL N/A 04/24/2014   Procedure: REMOVAL FOREIGN BODY ESOPHAGEAL;  Surgeon: [name], MD;  Location: [facility] OR;  Service: ENT;  Laterality: N/A;  . WISDOM TOOTH EXTRACTION  2013    OB History    Gravida Para Term Preterm AB Living   1 1 1  0 0 1   SAB TAB Ectopic Multiple Live Births   0 0 0   1       Home Medications    Prior to Admission medications   Medication Sig Start Date End Date Taking? Authorizing Provider  azithromycin (ZITHROMAX) 250 MG tablet Take 1 tablet (250 mg total) daily by mouth. Take first 2 tablets together, then 1 every day until finished. 06/08/17   Muthersbaugh, Dahlia Client, PA-C  benzonatate (TESSALON PERLES) 100 MG capsule Take 1 capsule (100 mg total) 3 (three) times daily as needed by mouth for cough (cough). 06/08/17   Muthersbaugh, Dahlia Client, PA-C  fluticasone (FLONASE) 50 MCG/ACT nasal spray Place 2 sprays daily into both nostrils. 06/08/17   Muthersbaugh, Dahlia Client, PA-C  guaiFENesin (MUCINEX) 600 MG 12 hr tablet Take 2 tablets (1,200 mg total) 2 (two) times daily by mouth. 06/08/17   Muthersbaugh, Dahlia Client, PA-C  ibuprofen (ADVIL,MOTRIN) 600 MG tablet Take 1 tablet (600 mg total) by mouth every 6 (  six) hours as needed. 04/07/17   Audry PiliMohr, Tyler, PA-C  metroNIDAZOLE (FLAGYL) 500 MG tablet Take 1 tablet (500 mg total) by mouth 2 (two) times daily. 04/07/17   Audry PiliMohr, Tyler, PA-C  ondansetron (ZOFRAN) 4 MG tablet Take 1 tablet (4 mg total) by mouth every 6 (six) hours. 04/07/17   Audry PiliMohr, Tyler, PA-C    Family History Family History  Problem Relation Age of Onset  . Arthritis Mother   . Bronchitis Mother   . Asthma Mother   . Hearing loss Paternal Grandfather   . Cancer Neg Hx   . Diabetes Neg Hx     Social History Social History   Tobacco Use  . Smoking status: Never Smoker  . Smokeless tobacco: Never Used    Substance Use Topics  . Alcohol use: No    Alcohol/week: 3.6 oz    Types: 6 Cans of beer per week  . Drug use: No     Allergies   Patient has no known allergies.   Review of Systems Review of Systems  Constitutional: Negative for chills and fever.  Gastrointestinal: Negative for vomiting.  Musculoskeletal: Negative for myalgias.  Skin: Positive for color change and wound.  Neurological: Positive for numbness. Negative for weakness.     Physical Exam Updated Vital Signs BP (!) 118/48   Pulse 71   Temp 98.3 F (36.8 C) (Oral)   Resp 17   SpO2 99%   Physical Exam  Constitutional: She appears well-developed and well-nourished. No distress.  HENT:  Head: Normocephalic and atraumatic.  Eyes: Right eye exhibits no discharge. Left eye exhibits no discharge. No scleral icterus.  Neck: Normal range of motion.  Cardiovascular: Intact distal pulses.  Pulmonary/Chest: No respiratory distress.  Musculoskeletal: Normal range of motion.  Mildly tender to palpation over the toe.  Brisk cap refill.  Sensation intact.  Range of motion normal.  DP pulses are 2+ bilaterally.  Neurological: She is alert.  Skin: Skin is warm and dry. Capillary refill takes less than 2 seconds. No pallor.  Patient with ulcer to the base of the left fifth and fourth toe.  She also has macerated skin between the webs of the toes of the third fourth and fifth digit.  There is minimal erythema.  No warmth.  No purulent drainage.    Psychiatric: Her behavior is normal. Judgment and thought content normal.  Nursing note and vitals reviewed.        ED Treatments / Results  Labs (all labs ordered are listed, but only abnormal results are displayed) Labs Reviewed  CBG MONITORING, ED    EKG  EKG Interpretation None       Radiology Dg Foot Complete Left  Result Date: 07/08/2017 CLINICAL DATA:  Foot ulcer plantar surface base of fourth and fifth toes EXAM: LEFT FOOT - COMPLETE 3+ VIEW COMPARISON:   None. FINDINGS: There is no evidence of fracture or dislocation. There is no evidence of arthropathy or other focal bone abnormality. Skin thickening plantar surface ball foot compatible with blister. No gas in the soft tissues. IMPRESSION: Skin thickening plantar surface of the foot. Negative for osteomyelitis or other bone abnormality. Electronically Signed   By: Marlan Palauharles  Clark M.D.   On: 07/08/2017 10:47    Procedures Procedures (including critical care time)  Medications Ordered in ED Medications  mupirocin cream (BACTROBAN) 2 % ( Topical Given 07/08/17 1138)  acetaminophen (TYLENOL) tablet 1,000 mg (1,000 mg Oral Given 07/08/17 1152)     Initial Impression / Assessment  and Plan / ED Course  I have reviewed the triage vital signs and the nursing notes.  Pertinent labs & imaging results that were available during my care of the patient were reviewed by me and considered in my medical decision making (see chart for details).     Patient presents to the ED with ulcer to the left foot.  Patient without any history of diabetes.  Reports drainage from the site with associated paresthesias.  Patient denies any systemic symptoms of fever, nausea, vomiting.  Patient is afebrile in the ED.  She does have macerated tissue between the webs of her third and fourth and fifth toe the left foot.  She also has 2 ulcers without any purulent drainage.  Minimal erythema noted.  She is neurovascularly intact.  CBG was normal in the ED.  X-ray obtained that shows no bony involvement.  Unknown etiology likely fungal such as tinea pedis versus bacterial infection.  Will treat with antifungal and antibiotics.  Encouraged symptomatic treatment at home and have given her a postop shoe to help let the area dry out.  Encouraged patient to follow-up with a podiatrist return in 2 days for wound recheck or sooner if symptoms are worsening.  Pt is hemodynamically stable, in NAD, & able to ambulate in the ED.  Evaluation does not show pathology that would require ongoing emergent intervention or inpatient treatment. I explained the diagnosis to the patient. Pain has been managed & has no complaints prior to dc. Pt is comfortable with above plan and is stable for discharge at this time. All questions were answered prior to disposition. Strict return precautions for f/u to the ED were discussed. Encouraged follow up with PCP.  Final Clinical Impressions(s) / ED Diagnoses   Final diagnoses:  Ulcer of left foot, unspecified ulcer stage [facility](HCC)    ED Discharge Orders    None       Wallace KellerLeaphart, Stryder Poitra T, PA-C 07/08/17 1210    Gerhard MunchLockwood, Robert, MD 07/11/17 2117

## 2017-07-08 NOTE — ED Notes (Signed)
Patient transported to X-ray 

## 2017-07-08 NOTE — ED Triage Notes (Signed)
Pt reports having blister to left foot that appeared ~ 1 week ago. 2 days ago the blister began to drain and she is now experiencing pain and numbness to left 4th and 5th toe. Pt states she is unable to move the toes.

## 2017-07-08 NOTE — ED Notes (Signed)
Pt CBG was 77, notified Michael(RN)

## 2017-07-11 ENCOUNTER — Other Ambulatory Visit: Payer: Self-pay

## 2017-07-11 ENCOUNTER — Encounter (HOSPITAL_COMMUNITY): Payer: Self-pay | Admitting: Emergency Medicine

## 2017-07-11 ENCOUNTER — Emergency Department (HOSPITAL_COMMUNITY)
Admission: EM | Admit: 2017-07-11 | Discharge: 2017-07-11 | Disposition: A | Discharge status: Home / Self Care | Payer: Medicaid Other | Attending: Emergency Medicine | Admitting: Emergency Medicine

## 2017-07-11 DIAGNOSIS — M79672 Pain in left foot: Secondary | ICD-10-CM | POA: Diagnosis present

## 2017-07-11 DIAGNOSIS — Z79899 Other long term (current) drug therapy: Secondary | ICD-10-CM | POA: Insufficient documentation

## 2017-07-11 DIAGNOSIS — L97529 Non-pressure chronic ulcer of other part of left foot with unspecified severity: Secondary | ICD-10-CM | POA: Diagnosis not present

## 2017-07-11 LAB — POC URINE PREG, ED: PREG TEST UR: NEGATIVE

## 2017-07-11 MED ORDER — SULFAMETHOXAZOLE-TRIMETHOPRIM 800-160 MG PO TABS: 1.0000 | ORAL_TABLET | Freq: Two times a day (BID) | ORAL | 0 refills | Status: DC

## 2017-07-11 NOTE — ED Triage Notes (Signed)
Pt verbalizes seen for foot ulcer on Tuesday and told to be reevaluated if not any better in 2-3 days. Pt verbalizes "it is still oozing concerned antibiotics are not working.

## 2017-07-11 NOTE — ED Notes (Signed)
Bed: WTR7 Expected date:  Expected time:  Means of arrival:  Comments: 

## 2017-07-11 NOTE — Discharge Instructions (Signed)
You were seen here today for your ulceration of your foot. Please continue treatment as prior wearing post op shoe and applying cream. I am switching you antibiotics and providing you a referral to a different podiatrist.

## 2017-07-11 NOTE — ED Provider Notes (Signed)
[facility]-EMERGENCY DEPT Provider Note   CSN: [id number] Arrival date & time: 07/11/17  [id number]     History   Chief Complaint Chief Complaint  Patient presents with  . Foot Pain    HPI Leah [name] is a 21 y.o. female who presents the ED today for continued ulceration of the left foot.  Patient was seen here on 07/08/2017 for pain she reported at the time that she had 1 week of blisters to the left foot between the fourth and fifth toe.  Over the last 4 days she has been having drainage to the site and a pain and some associated paresthesias to the area.  She states it is difficult to move the toe itself but she is still able to.  She has no history of diabetes and had normal CBG during the last visit.  She reports that there is some drainage that is clear.  She has been ambulatory with a post op shoe that she has been wearing over the last couple days.  She has been taking her Keflex as prescribed and using her Lamisil as prescribed.  She denies any systemic signs of illness including fever, chills, nausea, vomiting.  She has attempted to follow-up but was told that they do not take her insurance that she is requesting a different referral.  HPI  Past Medical History:  Diagnosis Date  . Anemia 2013  . Anxiety 2011   no meds during pregnancy  . Bipolar 1 disorder (HCC)   . Chlamydia   . Gall stones   . GERD (gastroesophageal reflux disease)   . 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 Seizure [facility](HCC) 2011   pseudoseizures    Patient Active Problem List   Diagnosis Date Noted  . 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 UTI (urinary tract infection) 08/19/2016  . Suicide attempt by drug ingestion (HCC)   . Contraception 03/10/2014  . Biliary colic 03/10/2014  . BV (bacterial vaginosis) 03/10/2014  . Depression 03/16/2013  . Allergic rhinitis, seasonal 01/26/2013  . Anemia, iron deficiency  12/01/2012    Past Surgical History:  Procedure Laterality Date  . CHOLECYSTECTOMY N/A 09/28/2014   Procedure: LAPAROSCOPIC CHOLECYSTECTOMY;  Surgeon: Axel FillerArmando Ramirez, MD;  Location: [facility] OR;  Service: General;  Laterality: N/A;  . CHOLECYSTECTOMY    . DIRECT LARYNGOSCOPY N/A 04/24/2014   Procedure: DIRECT LARYNGOSCOPY;  Surgeon: Darletta MollSui W Teoh, MD;  [facility];  Service: ENT;  Laterality: N/A;  . FOREIGN BODY REMOVAL ESOPHAGEAL N/A 04/24/2014   Procedure: REMOVAL FOREIGN BODY ESOPHAGEAL;  Surgeon: Darletta MollSui W Teoh, MD;  [facility];  Service: ENT;  Laterality: N/A;  . WISDOM TOOTH EXTRACTION  2013    OB History    Gravida Para Term Preterm AB Living   1 1 1  0 0 1   SAB TAB Ectopic Multiple Live Births   0 0 0   1       Home Medications    Prior to Admission medications   Medication Sig Start Date End Date Taking? Authorizing Provider  cephALEXin (KEFLEX) 500 MG capsule Take 1 capsule (500 mg total) by mouth 4 (four) times daily. 07/08/17  Yes Leaphart, Lynann BeaverKenneth T, PA-C  ibuprofen (ADVIL,MOTRIN) 600 MG tablet Take 1 tablet (600 mg total) by mouth every 6 (six) hours as needed. 04/07/17  Yes Audry PiliMohr, Tyler, PA-C  terbinafine (LAMISIL AT) 1 % cream Apply 1 application  topically 2 (two) times daily. 07/08/17  Yes Leaphart, Lynann BeaverKenneth T, PA-C  azithromycin (ZITHROMAX) 250 MG tablet Take 1 tablet (250 mg total) daily by mouth. Take first 2 tablets together, then 1 every day until finished. Patient not taking: Reported on 07/11/2017 06/08/17   Muthersbaugh, Dahlia ClientHannah, PA-C  benzonatate (TESSALON PERLES) 100 MG capsule Take 1 capsule (100 mg total) 3 (three) times daily as needed by mouth for cough (cough). Patient not taking: Reported on 07/11/2017 06/08/17   Muthersbaugh, Dahlia ClientHannah, PA-C  fluticasone [facility](FLONASE) 50 MCG/ACT nasal spray Place 2 sprays daily into both nostrils. Patient not taking: Reported on 07/11/2017 06/08/17   Muthersbaugh, Dahlia ClientHannah, PA-C  guaiFENesin (MUCINEX) 600 MG 12 hr tablet Take 2  tablets (1,200 mg total) 2 (two) times daily by mouth. Patient not taking: Reported on 07/11/2017 06/08/17   Muthersbaugh, Dahlia ClientHannah, PA-C  metroNIDAZOLE (FLAGYL) 500 MG tablet Take 1 tablet (500 mg total) by mouth 2 (two) times daily. Patient not taking: Reported on 07/11/2017 04/07/17   Audry PiliMohr, Tyler, PA-C  ondansetron (ZOFRAN) 4 MG tablet Take 1 tablet (4 mg total) by mouth every 6 (six) hours. 04/07/17   Audry PiliMohr, Tyler, PA-C    Family History Family History  Problem Relation Age of Onset  . Arthritis Mother   . Bronchitis Mother   . Asthma Mother   . Hearing loss Paternal Grandfather   . Cancer Neg Hx   . Diabetes Neg Hx     Social History Social History   Tobacco Use  . Smoking status: Never Smoker  . Smokeless tobacco: Never Used  Substance Use Topics  . Alcohol use: No    Alcohol/week: 3.6 oz    Types: 6 Cans of beer per week  . Drug use: No     Allergies   Patient has no known allergies.   Review of Systems Review of Systems  All other systems reviewed and are negative.    Physical Exam Updated Vital Signs BP 112/61 (BP Location: Right Arm)   Pulse 81   Temp 98.7 F (37.1 C) (Oral)   Resp 16   SpO2 100%   Physical Exam  Constitutional: She appears well-developed and well-nourished.  HENT:  Head: Normocephalic and atraumatic.  Right Ear: External ear normal.  Left Ear: External ear normal.  Eyes: Conjunctivae are normal. Right eye exhibits no discharge. Left eye exhibits no discharge. No scleral icterus.  Pulmonary/Chest: Effort normal. No respiratory distress.  Musculoskeletal:  Left foot in post op shoe.  Patient has mild tenderness palpation over the fifth digit.  No tenderness palpation over the fourth digit.  Sensation intact.  Range of motion is normal.  Cap refill less than 2 seconds.  Posterior tib and dorsalis pedis pulse 2+.  Neurological: She is alert.  Skin: No pallor.  Patient with ulcer to the base of the left fourth and fifth toe.  She has  macerated tissue between the webs of the fourth and fifth digits.  There is mild erythema to the fifth digit.  No evidence of streaking.  She says this is not increased since prior.  No warmth.  No purulent drainage.  Psychiatric: She has a normal mood and affect.  Nursing note and vitals reviewed.        ED Treatments / Results  Labs (all labs ordered are listed, but only abnormal results are displayed) Labs Reviewed  POC URINE PREG, ED    EKG  EKG Interpretation None       Radiology No results found.  Procedures Procedures (including critical care time)  Medications Ordered in ED Medications - No data to display   Initial Impression / Assessment and Plan / ED Course  I have reviewed the triage vital signs and the nursing notes.  Pertinent labs & imaging results that were available during my care of the patient were reviewed by me and considered in my medical decision making (see chart for details).     21 y.o. female presenting for continued ulceration to the left fourth and fifth toes.  Patient was seen here on 07/08/2017 for the same.  She was given Keflex and Lamisil.  She has been taking these as prescribed.  She tried to follow with a podiatrist but they did not take her insurance.  No change in her symptoms.  She denies any streaking or surrounding redness.  She still able to move the toe.  She is neurovascularly intact.  Exam shows 1 ulcerative area to the bottom of the foot between 4th and 5th toes.  There is maceration between the toes.  There is some erythema to the toe but no restricted range of motion.  Do not suspect septic joint.  Believe the patient would benefit from broadening coverage of antibiotic.  Will give Bactrim (confirmed patient is not pregnant).  Patient continue using Lamisil. Will give different referral for podiatrist.  Patient agreement with plan and appears safe for discharge.  Final Clinical Impressions(s) / ED Diagnoses   Final  diagnoses:  Ulcer of left foot, unspecified ulcer stage [facility])    ED Discharge Orders        Ordered    sulfamethoxazole-trimethoprim (BACTRIM DS,SEPTRA DS) 800-160 MG tablet  2 times daily     07/11/17 1231       Princella Pellegrini 07/11/17 1625    Gwyneth Sprout, MD 07/13/17 1529

## 2017-08-14 ENCOUNTER — Encounter (HOSPITAL_COMMUNITY): Payer: Self-pay

## 2017-08-14 ENCOUNTER — Other Ambulatory Visit: Payer: Self-pay

## 2017-08-14 ENCOUNTER — Emergency Department (HOSPITAL_COMMUNITY)
Admission: EM | Admit: 2017-08-14 | Discharge: 2017-08-14 | Disposition: A | Discharge status: Home / Self Care | Payer: Medicaid Other | Attending: Emergency Medicine | Admitting: Emergency Medicine

## 2017-08-14 DIAGNOSIS — Z79899 Other long term (current) drug therapy: Secondary | ICD-10-CM | POA: Diagnosis not present

## 2017-08-14 DIAGNOSIS — R197 Diarrhea, unspecified: Secondary | ICD-10-CM | POA: Insufficient documentation

## 2017-08-14 DIAGNOSIS — R110 Nausea: Secondary | ICD-10-CM | POA: Insufficient documentation

## 2017-08-14 LAB — I-STAT BETA HCG BLOOD, ED (MC, WL, AP ONLY)

## 2017-08-14 LAB — COMPREHENSIVE METABOLIC PANEL
ALBUMIN: 4.3 g/dL (ref 3.5–5.0)
ALK PHOS: 58 U/L (ref 38–126)
ALT: 18 U/L (ref 14–54)
ANION GAP: 7 (ref 5–15)
AST: 19 U/L (ref 15–41)
BILIRUBIN TOTAL: 0.9 mg/dL (ref 0.3–1.2)
BUN: 15 mg/dL (ref 6–20)
CALCIUM: 9.3 mg/dL (ref 8.9–10.3)
CO2: 23 mmol/L (ref 22–32)
Chloride: 108 mmol/L (ref 101–111)
Creatinine, Ser: 0.57 mg/dL (ref 0.44–1.00)
GFR calc Af Amer: >60 mL/min (ref >60)
GFR calc non Af Amer: >60 mL/min (ref >60)
GLUCOSE: 89 mg/dL (ref 65–99)
Potassium: 3.6 mmol/L (ref 3.5–5.1)
Sodium: 138 mmol/L (ref 135–145)
TOTAL PROTEIN: 7.6 g/dL (ref 6.5–8.1)

## 2017-08-14 LAB — URINALYSIS, ROUTINE W REFLEX MICROSCOPIC
BILIRUBIN URINE: NEGATIVE
Glucose, UA: NEGATIVE mg/dL
HGB URINE DIPSTICK: NEGATIVE
Ketones, ur: NEGATIVE mg/dL
Leukocytes, UA: NEGATIVE
NITRITE: NEGATIVE
Protein, ur: NEGATIVE mg/dL
SPECIFIC GRAVITY, URINE: 1.026 (ref 1.005–1.030)
pH: 7 (ref 5.0–8.0)

## 2017-08-14 LAB — CBC
HCT: 35.1 % — ABNORMAL LOW (ref 36.0–46.0)
HEMOGLOBIN: 12.1 g/dL (ref 12.0–15.0)
MCH: 30.8 pg (ref 26.0–34.0)
MCHC: 34.5 g/dL (ref 30.0–36.0)
MCV: 89.3 fL (ref 78.0–100.0)
PLATELETS: 198 10*3/uL (ref 150–400)
RBC: 3.93 MIL/uL (ref 3.87–5.11)
RDW: 12.2 % (ref 11.5–15.5)
WBC: 6.8 10*3/uL (ref 4.0–10.5)

## 2017-08-14 LAB — LIPASE, BLOOD: Lipase: 30 U/L (ref 11–51)

## 2017-08-14 MED ORDER — ONDANSETRON 4 MG PO TBDP: 4.0000 mg | ORAL_TABLET | Freq: Three times a day (TID) | ORAL | 0 refills | Status: DC | PRN

## 2017-08-14 MED ORDER — SODIUM CHLORIDE 0.9 % IV BOLUS (SEPSIS)
1000.0000 mL | Freq: Once | INTRAVENOUS | Status: AC
  Administered 2017-08-14: 1000 mL via INTRAVENOUS

## 2017-08-14 MED ORDER — ONDANSETRON HCL 4 MG/2ML IJ SOLN
4.0000 mg | Freq: Once | INTRAMUSCULAR | Status: AC
  Administered 2017-08-14: 4 mg via INTRAVENOUS
  Filled 2017-08-14: qty 2

## 2017-08-14 MED ORDER — DICYCLOMINE HCL 20 MG PO TABS: 20.0000 mg | ORAL_TABLET | Freq: Two times a day (BID) | ORAL | 0 refills | Status: DC

## 2017-08-14 MED ORDER — METRONIDAZOLE 500 MG PO TABS: 500.0000 mg | ORAL_TABLET | Freq: Two times a day (BID) | ORAL | 0 refills | Status: AC

## 2017-08-14 NOTE — ED Notes (Signed)
PT INSTRUCTED ON COLLECTION OF STOOL AND EMESIS. BSC IN ROOM AND HYGIENE MATERIALS AT BEDSIDE.

## 2017-08-14 NOTE — ED Provider Notes (Addendum)
[facility]-EMERGENCY DEPT Provider Note   CSN: [id number] Arrival date & time: 08/14/17  [id number]     History   Chief Complaint Chief Complaint  Patient presents with  . Abdominal Pain  . Nausea    HPI Green[place]Asia Stark [name] Ardila is a 22 y.o. female.  HPI   22 year old female with history of GERD, bipolar disorder, presents with concern for diarrhea and nausea.  Reports that she had diarrhea approximately 3-4 times per day, and at times up to 5 times per day over the last 3 weeks. Reports occasional vomiting. Some lower abdominal cramping.  No fevers. Was recently on abx about 4 weeks ago.  Past Medical History:  Diagnosis Date  . Anemia 2013  . Anxiety 2011   no meds during pregnancy  . Bipolar 1 disorder (HCC)   . Chlamydia   . Gall stones   . GERD (gastroesophageal reflux disease)   . 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 Seizure [facility](HCC) 2011   pseudoseizures    Patient Active Problem List   Diagnosis Date Noted  . 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 UTI (urinary tract infection) 08/19/2016  . Suicide attempt by drug ingestion (HCC)   . Contraception 03/10/2014  . Biliary colic 03/10/2014  . BV (bacterial vaginosis) 03/10/2014  . Depression 03/16/2013  . Allergic rhinitis, seasonal 01/26/2013  . Anemia, iron deficiency 12/01/2012    Past Surgical History:  Procedure Laterality Date  . CHOLECYSTECTOMY N/A 09/28/2014   Procedure: LAPAROSCOPIC CHOLECYSTECTOMY;  Surgeon: Axel FillerArmando Ramirez, MD;  Location: [facility] OR;  Service: General;  Laterality: N/A;  . CHOLECYSTECTOMY    . DIRECT LARYNGOSCOPY N/A 04/24/2014   Procedure: DIRECT LARYNGOSCOPY;  Surgeon: Darletta MollSui W Teoh, MD;  [facility];  Service: ENT;  Laterality: N/A;  . FOREIGN BODY REMOVAL ESOPHAGEAL N/A 04/24/2014   Procedure: REMOVAL FOREIGN BODY ESOPHAGEAL;  Surgeon: Darletta MollSui W Teoh, MD;  [facility];  Service: ENT;  Laterality: N/A;  .  WISDOM TOOTH EXTRACTION  2013    OB History    Gravida Para Term Preterm AB Living   1 1 1  0 0 1   SAB TAB Ectopic Multiple Live Births   0 0 0   1       Home Medications    Prior to Admission medications   Medication Sig Start Date End Date Taking? Authorizing Provider  azithromycin (ZITHROMAX) 250 MG tablet Take 1 tablet (250 mg total) daily by mouth. Take first 2 tablets together, then 1 every day until finished. Patient not taking: Reported on 07/11/2017 06/08/17   Muthersbaugh, Dahlia ClientHannah, PA-C  benzonatate (TESSALON PERLES) 100 MG capsule Take 1 capsule (100 mg total) 3 (three) times daily as needed by mouth for cough (cough). Patient not taking: Reported on 07/11/2017 06/08/17   Muthersbaugh, Dahlia ClientHannah, PA-C  cephALEXin (KEFLEX) 500 MG capsule Take 1 capsule (500 mg total) by mouth 4 (four) times daily. 07/08/17   Rise MuLeaphart, Kenneth T, PA-C  dicyclomine (BENTYL) 20 MG tablet Take 1 tablet (20 mg total) by mouth 2 (two) times daily. 08/14/17   Alvira MondaySchlossman, Lakendra Helling, MD  fluticasone (FLONASE) 50 MCG/ACT nasal spray Place 2 sprays daily into both nostrils. Patient not taking: Reported on 07/11/2017 06/08/17   Muthersbaugh, Dahlia ClientHannah, PA-C  guaiFENesin (MUCINEX) 600 MG 12 hr tablet Take 2 tablets (1,200 mg total) 2 (two) times daily by mouth. Patient not taking: Reported on 07/11/2017 06/08/17   Muthersbaugh,  Dahlia Client, PA-C  ibuprofen (ADVIL,MOTRIN) 600 MG tablet Take 1 tablet (600 mg total) by mouth every 6 (six) hours as needed. 04/07/17   Audry Pili, PA-C  metroNIDAZOLE (FLAGYL) 500 MG tablet Take 1 tablet (500 mg total) by mouth 2 (two) times daily for 14 days. 08/14/17 08/28/17  Alvira Monday, MD  ondansetron (ZOFRAN ODT) 4 MG disintegrating tablet Take 1 tablet (4 mg total) by mouth every 8 (eight) hours as needed for nausea or vomiting. 08/14/17   Alvira Monday, MD  ondansetron (ZOFRAN) 4 MG tablet Take 1 tablet (4 mg total) by mouth every 6 (six) hours. 04/07/17   Audry Pili, PA-C    sulfamethoxazole-trimethoprim (BACTRIM DS,SEPTRA DS) 800-160 MG tablet Take 1 tablet by mouth 2 (two) times daily. 07/11/17   Maczis, Elmer Sow, PA-C  terbinafine (LAMISIL AT) 1 % cream Apply 1 application topically 2 (two) times daily. 07/08/17   Rise Mu, PA-C    Family History Family History  Problem Relation Age of Onset  . Arthritis Mother   . Bronchitis Mother   . Asthma Mother   . Hearing loss Paternal Grandfather   . Cancer Neg Hx   . Diabetes Neg Hx     Social History Social History   Tobacco Use  . Smoking status: Never Smoker  . Smokeless tobacco: Never Used  Substance Use Topics  . Alcohol use: No    Alcohol/week: 3.6 oz    Types: 6 Cans of beer per week  . Drug use: No     Allergies   Bactrim [sulfamethoxazole-trimethoprim]   Review of Systems Review of Systems  Constitutional: Negative for fever.  HENT: Negative for sore throat.   Eyes: Negative for visual disturbance.  Respiratory: Negative for cough and shortness of breath.   Cardiovascular: Negative for chest pain.  Gastrointestinal: Positive for abdominal pain, diarrhea and nausea. Negative for vomiting.  Genitourinary: Negative for difficulty urinating, dysuria, vaginal bleeding and vaginal discharge.  Musculoskeletal: Negative for back pain and neck pain.  Skin: Negative for rash.  Neurological: Negative for syncope and headaches.     Physical Exam Updated Vital Signs BP (!) 101/54 (BP Location: Left Arm)   Pulse 66   Temp 98.8 F (37.1 C) (Oral)   Resp 14   Ht 5' (1.524 m)   Wt 47.6 kg (105 lb)   SpO2 100%   BMI 20.51 kg/m   Physical Exam  Constitutional: She is oriented to person, place, and time. She appears well-developed and well-nourished. No distress.  HENT:  Head: Normocephalic and atraumatic.  Eyes: Conjunctivae and EOM are normal.  Neck: Normal range of motion.  Cardiovascular: Normal rate, regular rhythm, normal heart sounds and intact distal pulses. Exam  reveals no gallop and no friction rub.  No murmur heard. Pulmonary/Chest: Effort normal and breath sounds normal. No respiratory distress. She has no wheezes. She has no rales.  Abdominal: Soft. She exhibits no distension. There is tenderness (mild diffuse). There is no guarding.  Musculoskeletal: She exhibits no edema or tenderness.  Neurological: She is alert and oriented to person, place, and time.  Skin: Skin is warm and dry. No rash noted. She is not diaphoretic. No erythema.  Nursing note and vitals reviewed.    ED Treatments / Results  Labs (all labs ordered are listed, but only abnormal results are displayed) Labs Reviewed  CBC - Abnormal; Notable for the following components:      Result Value   HCT 35.1 (*)    All other  components within normal limits  GASTROINTESTINAL PANEL BY PCR, STOOL (REPLACES STOOL CULTURE)  LIPASE, BLOOD  COMPREHENSIVE METABOLIC PANEL  URINALYSIS, ROUTINE W REFLEX MICROSCOPIC  I-STAT BETA HCG BLOOD, ED ([facility], [facility], [facility] ONLY)    EKG  EKG Interpretation None       Radiology No results found.  Procedures Procedures (including critical care time)  Medications Ordered in ED Medications  sodium chloride 0.9 % bolus 1,000 mL (0 mLs Intravenous Stopped 08/14/17 1230)  ondansetron (ZOFRAN) injection 4 mg (4 mg Intravenous Given 08/14/17 1108)     Initial Impression / Assessment and Plan / ED Course  I have reviewed the triage vital signs and the nursing notes.  Pertinent labs & imaging results that were available during my care of the patient were reviewed by me and considered in my medical decision making (see chart for details).     22 year old female with history of GERD, bipolar disorder, presents with concern for diarrhea and nausea.  Patient with benign abd exam, doubt acute surgical pathology.  Ordered GI pathogen and CDiff given duration of diarrhea and recent abx.  Hemodynamically stable. Labs WNL. Given rx for zofran and bentyl, flagyl  just in case cdiff positive.  Patient discharged in stable condition with understanding of reasons to return.   Final Clinical Impressions(s) / ED Diagnoses   Final diagnoses:  Diarrhea, unspecified type  Nausea    ED Discharge Orders        Ordered    ondansetron (ZOFRAN ODT) 4 MG disintegrating tablet  Every 8 hours PRN     08/14/17 1146    dicyclomine (BENTYL) 20 MG tablet  2 times daily     08/14/17 1146    metroNIDAZOLE (FLAGYL) 500 MG tablet  2 times daily     08/14/17 1155       Alvira Monday, MD 08/14/17 1610    Alvira Monday, MD 09/01/17 1255

## 2017-08-14 NOTE — ED Triage Notes (Signed)
Patient c/o "abdominal discomfort, diarrhea and feeling quesey."x 3 weeks.

## 2017-08-15 LAB — GASTROINTESTINAL PANEL BY PCR, STOOL (REPLACES STOOL CULTURE)
ASTROVIRUS: NOT DETECTED
Adenovirus F40/41: NOT DETECTED
CAMPYLOBACTER SPECIES: NOT DETECTED
CRYPTOSPORIDIUM: NOT DETECTED
Cyclospora cayetanensis: NOT DETECTED
ENTEROTOXIGENIC E COLI (ETEC): NOT DETECTED
Entamoeba histolytica: NOT DETECTED
Enteroaggregative E coli (EAEC): NOT DETECTED
Enteropathogenic E coli (EPEC): NOT DETECTED
Giardia lamblia: NOT DETECTED
Norovirus GI/GII: NOT DETECTED
PLESIMONAS SHIGELLOIDES: NOT DETECTED
ROTAVIRUS A: NOT DETECTED
SAPOVIRUS (I, II, IV, AND V): NOT DETECTED
SHIGA LIKE TOXIN PRODUCING E COLI (STEC): NOT DETECTED
Salmonella species: NOT DETECTED
Shigella/Enteroinvasive E coli (EIEC): NOT DETECTED
VIBRIO SPECIES: NOT DETECTED
Vibrio cholerae: NOT DETECTED
Yersinia enterocolitica: NOT DETECTED

## 2017-08-23 ENCOUNTER — Encounter (HOSPITAL_BASED_OUTPATIENT_CLINIC_OR_DEPARTMENT_OTHER): Payer: Self-pay | Admitting: *Deleted

## 2017-08-23 ENCOUNTER — Other Ambulatory Visit: Payer: Self-pay

## 2017-08-23 ENCOUNTER — Emergency Department (HOSPITAL_BASED_OUTPATIENT_CLINIC_OR_DEPARTMENT_OTHER)
Admission: EM | Admit: 2017-08-23 | Discharge: 2017-08-23 | Disposition: A | Discharge status: Home / Self Care | Payer: Medicaid Other | Attending: Emergency Medicine | Admitting: Emergency Medicine

## 2017-08-23 DIAGNOSIS — Z79899 Other long term (current) drug therapy: Secondary | ICD-10-CM | POA: Insufficient documentation

## 2017-08-23 DIAGNOSIS — K0889 Other specified disorders of teeth and supporting structures: Secondary | ICD-10-CM | POA: Diagnosis present

## 2017-08-23 MED ORDER — NAPROXEN 500 MG PO TABS: 500.0000 mg | ORAL_TABLET | Freq: Two times a day (BID) | ORAL | 0 refills | Status: DC

## 2017-08-23 MED ORDER — KETOROLAC TROMETHAMINE 30 MG/ML IJ SOLN
30.0000 mg | Freq: Once | INTRAMUSCULAR | Status: AC
  Administered 2017-08-23: 30 mg via INTRAMUSCULAR
  Filled 2017-08-23: qty 1

## 2017-08-23 NOTE — ED Triage Notes (Signed)
Pt c/o dental pain dn bleeding, post op x 3 days

## 2017-08-23 NOTE — ED Provider Notes (Signed)
Seen and evaluated.  Case with Dr. Sampson GoonFitzgerald.  Had extractions of 3 teeth done 2 days ago by Dr. Barbette MerinoJensen of oral surgery.  Has having pain that is not well controlled with hydrocodone.  On exam sockets appear intact with the left upper tooth with good solid clot in the alveolar fossa.  Sutures noted in the inferior/maxillary tooth fossa.  No neck swelling or induration.  No trismus.  He is drinking and hydrating well.  Discussed adding anti-inflammatories to her hydrocodone.  Discussed that her symptoms will not be completely controlled but should be manageable for her with this therapy.  Avoid straws.  Avoid rinsing.  Call her surgeon if not improving.   Leah [name], Leah Waszak, MD 08/23/17 1340

## 2017-08-23 NOTE — ED Provider Notes (Signed)
[facility] EMERGENCY DEPARTMENT Provider Note   CSN: [id number] Arrival date & time: 08/23/17  1251     History   Chief Complaint Chief Complaint  Patient presents with  . Dental Pain    HPI Leah [name] is a 22 y.o. female. She is accompanied by her boyfriend's mother.   HPI  Patient presents with worsening dental pain after having 3 tooth extractions on Thursday (2 days ago) and neck swelling. She is also concerned that there was some blood on the gauze in her mouth when she checked. She has been unable to take solid PO due to discomfort. Initially hydrocodone-acetaminophen 5-325 mg (prescribed after procedure) along with tramadol (she had from previous dental visit) controlled pain, but she was unable to sleep well last night and says she was waking up every 2 hours due to pain. Her boyfriend's mother is concerned that she was not prescribed antibiotics after the procedure. Patient denies fevers. Denies trouble or pain swallowing.   Past Medical History:  Diagnosis Date  . Anemia 2013  . Anxiety 2011   no meds during pregnancy  . Bipolar 1 disorder (HCC)   . Chlamydia   . Gall stones   . GERD (gastroesophageal reflux disease)   . 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 Seizure [facility]) 2011   pseudoseizures    Patient Active Problem List   Diagnosis Date Noted  . 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 UTI (urinary tract infection) 08/19/2016  . Suicide attempt by drug ingestion (HCC)   . Contraception 03/10/2014  . Biliary colic 03/10/2014  . BV (bacterial vaginosis) 03/10/2014  . Depression 03/16/2013  . Allergic rhinitis, seasonal 01/26/2013  . Anemia, iron deficiency 12/01/2012    Past Surgical History:  Procedure Laterality Date  . CHOLECYSTECTOMY N/A 09/28/2014   Procedure: LAPAROSCOPIC CHOLECYSTECTOMY;  Surgeon: Axel Filler, MD;  Location: [facility] OR;  Service: General;   Laterality: N/A;  . CHOLECYSTECTOMY    . DIRECT LARYNGOSCOPY N/A 04/24/2014   Procedure: DIRECT LARYNGOSCOPY;  Surgeon: [name], MD;  Location: [facility] OR;  Service: ENT;  Laterality: N/A;  . FOREIGN BODY REMOVAL ESOPHAGEAL N/A 04/24/2014   Procedure: REMOVAL FOREIGN BODY ESOPHAGEAL;  Surgeon: [name], MD;  Location: [facility] OR;  Service: ENT;  Laterality: N/A;  . WISDOM TOOTH EXTRACTION  2013    OB History    Gravida Para Term Preterm AB Living   1 1 1  0 0 1   SAB TAB Ectopic Multiple Live Births   0 0 0   1       Home Medications    Prior to Admission medications   Medication Sig Start Date End Date Taking? Authorizing Provider  HYDROcodone-acetaminophen (NORCO/VICODIN) 5-325 MG tablet Take 1 tablet by mouth every 6 (six) hours as needed for moderate pain.   Yes [provider]  azithromycin (ZITHROMAX) 250 MG tablet Take 1 tablet (250 mg total) daily by mouth. Take first 2 tablets together, then 1 every day until finished. Patient not taking: Reported on 07/11/2017 06/08/17   Muthersbaugh, Dahlia Client, PA-C  benzonatate (TESSALON PERLES) 100 MG capsule Take 1 capsule (100 mg total) 3 (three) times daily as needed by mouth for cough (cough). Patient not taking: Reported on 07/11/2017 06/08/17   Muthersbaugh, Dahlia Client, PA-C  cephALEXin (KEFLEX) 500 MG capsule Take 1 capsule (500 mg total) by mouth 4 (four) times daily. 07/08/17   Demetrios Loll  T, PA-C  dicyclomine (BENTYL) 20 MG tablet Take 1 tablet (20 mg total) by mouth 2 (two) times daily. 08/14/17   Alvira MondaySchlossman, Erin, MD  fluticasone (FLONASE) 50 MCG/ACT nasal spray Place 2 sprays daily into both nostrils. Patient not taking: Reported on 07/11/2017 06/08/17   Muthersbaugh, Dahlia ClientHannah, PA-C  guaiFENesin (MUCINEX) 600 MG 12 hr tablet Take 2 tablets (1,200 mg total) 2 (two) times daily by mouth. Patient not taking: Reported on 07/11/2017 06/08/17   Muthersbaugh, Dahlia ClientHannah, PA-C  ibuprofen (ADVIL,MOTRIN) 600 MG tablet Take 1 tablet (600 mg  total) by mouth every 6 (six) hours as needed. 04/07/17   Audry PiliMohr, Tyler, PA-C  metroNIDAZOLE (FLAGYL) 500 MG tablet Take 1 tablet (500 mg total) by mouth 2 (two) times daily for 14 days. 08/14/17 08/28/17  Alvira MondaySchlossman, Erin, MD  naproxen (NAPROSYN) 500 MG tablet Take 1 tablet (500 mg total) by mouth 2 (two) times daily with a meal. 08/23/17   Casey BurkittFitzgerald, Hillary Moen, MD  ondansetron (ZOFRAN ODT) 4 MG disintegrating tablet Take 1 tablet (4 mg total) by mouth every 8 (eight) hours as needed for nausea or vomiting. 08/14/17   Alvira MondaySchlossman, Erin, MD  ondansetron (ZOFRAN) 4 MG tablet Take 1 tablet (4 mg total) by mouth every 6 (six) hours. 04/07/17   Audry PiliMohr, Tyler, PA-C  terbinafine (LAMISIL AT) 1 % cream Apply 1 application topically 2 (two) times daily. 07/08/17   Rise MuLeaphart, Kenneth T, PA-C    Family History Family History  Problem Relation Age of Onset  . Arthritis Mother   . Bronchitis Mother   . Asthma Mother   . Hearing loss Paternal Grandfather   . Cancer Neg Hx   . Diabetes Neg Hx     Social History Social History   Tobacco Use  . Smoking status: Never Smoker  . Smokeless tobacco: Never Used  Substance Use Topics  . Alcohol use: No    Alcohol/week: 3.6 oz    Types: 6 Cans of beer per week  . Drug use: No     Allergies   Bactrim [sulfamethoxazole-trimethoprim]   Review of Systems Review of Systems  Constitutional: Negative for chills and fever.  HENT: Positive for dental problem. Negative for drooling, sore throat and trouble swallowing.   Respiratory: Negative for cough and shortness of breath.   Cardiovascular: Negative for chest pain.  Gastrointestinal: Positive for constipation. Negative for nausea and vomiting.  Skin: Negative for rash.  Neurological: Negative for headaches.     Physical Exam Updated Vital Signs BP 126/87   Pulse 70   Resp 18   Ht 5\' 5"  (1.651 m)   Wt 47.6 kg (105 lb)   SpO2 100%   BMI 17.47 kg/m   Physical Exam  Constitutional: She is oriented to  person, place, and time. She appears well-developed and well-nourished. No distress.  HENT:  Head: Normocephalic and atraumatic.  2 molars on left removed (one upper, one lower). Stitch present in lower socket. Small amount of inflamed, tender gum tissue at top of this socket. Mild TTP of upper socket but appears well-healing. R upper molar socket nontender and well-healing.   Neck: Normal range of motion. Neck supple.  Mild tenderness to palpation of submandibular region on left but no obvious swelling.  Cardiovascular: Normal rate and regular rhythm.  No murmur heard. Pulmonary/Chest: Effort normal and breath sounds normal. No respiratory distress.  Abdominal: Soft. Bowel sounds are normal. There is no tenderness.  Musculoskeletal: Normal range of motion.  Neurological: She is alert and oriented  to person, place, and time.  Skin: Skin is warm and dry.  Psychiatric: Her behavior is normal.  Vitals reviewed.    ED Treatments / Results  Labs (all labs ordered are listed, but only abnormal results are displayed) Labs Reviewed - No data to display  EKG  EKG Interpretation None       Radiology No results found.  Procedures Procedures (including critical care time)  Medications Ordered in ED Medications  ketorolac (TORADOL) 30 MG/ML injection 30 mg (30 mg Intramuscular Given 08/23/17 1335)     Initial Impression / Assessment and Plan / ED Course  I have reviewed the triage vital signs and the nursing notes.  Pertinent labs & imaging results that were available during my care of the patient were reviewed by me and considered in my medical decision making (see chart for details).  Toradol shot administered.  Final Clinical Impressions(s) / ED Diagnoses   Final diagnoses:  Tooth pain   Patient with dental pain after recent extractions. Afebrile and tolerating liquid PO. Suspect had worsening swelling this am (since resolved) due to lying flat while sleeping. Recommended  naproxen 500 mg BID for next week to help with inflammation. Gums appear well-healing aside from small projection of tissue at top of left lower molar socket that is mildly inflamed; did not feel antibiotics were warranted especially after repeat courses of antibiotic for recent foot infection. To continue hydrocodone-acetaminophen 5-325 mg prescribed by dentist and to augment with regular NSAID until pain improves. To follow-up with her dentist if has further concerns.   ED Discharge Orders        Ordered    naproxen (NAPROSYN) 500 MG tablet  2 times daily with meals     08/23/17 1342     Dani Gobble, MD [facility], PGY-3    Casey Burkitt, MD 08/23/17 1813    Rolland Porter, MD 08/25/17 2139

## 2017-08-23 NOTE — Discharge Instructions (Signed)
Ms. Early CharsSiler,   Please take naproxen for dental pain along with your oxycodone-acetaminophen. It is best to take this with food to avoid stomach upset. Take this twice daily for the next 7 days then as needed. Please call your dentist at the emergency number provided if no improvement. Swelling will likely be worse when first waking up in the morning.

## 2017-09-05 ENCOUNTER — Emergency Department (HOSPITAL_COMMUNITY)
Admission: EM | Admit: 2017-09-05 | Discharge: 2017-09-05 | Disposition: A | Discharge status: Home / Self Care | Payer: Medicaid Other | Attending: Emergency Medicine | Admitting: Emergency Medicine

## 2017-09-05 ENCOUNTER — Other Ambulatory Visit: Payer: Self-pay

## 2017-09-05 ENCOUNTER — Encounter (HOSPITAL_COMMUNITY): Payer: Self-pay | Admitting: Emergency Medicine

## 2017-09-05 DIAGNOSIS — R350 Frequency of micturition: Secondary | ICD-10-CM | POA: Insufficient documentation

## 2017-09-05 DIAGNOSIS — Z79899 Other long term (current) drug therapy: Secondary | ICD-10-CM | POA: Diagnosis not present

## 2017-09-05 DIAGNOSIS — R102 Pelvic and perineal pain: Secondary | ICD-10-CM | POA: Insufficient documentation

## 2017-09-05 LAB — URINALYSIS, ROUTINE W REFLEX MICROSCOPIC
BILIRUBIN URINE: NEGATIVE
Glucose, UA: NEGATIVE mg/dL
Hgb urine dipstick: NEGATIVE
KETONES UR: 5 mg/dL — AB
Nitrite: NEGATIVE
PH: 5 (ref 5.0–8.0)
PROTEIN: NEGATIVE mg/dL
Specific Gravity, Urine: 1.027 (ref 1.005–1.030)

## 2017-09-05 LAB — WET PREP, GENITAL
Clue Cells Wet Prep HPF POC: NONE SEEN
Sperm: NONE SEEN
Trich, Wet Prep: NONE SEEN
WBC WET PREP: NONE SEEN
YEAST WET PREP: NONE SEEN

## 2017-09-05 LAB — POC URINE PREG, ED: Preg Test, Ur: NEGATIVE

## 2017-09-05 LAB — CBG MONITORING, ED: GLUCOSE-CAPILLARY: 85 mg/dL (ref 65–99)

## 2017-09-05 MED ORDER — IBUPROFEN 200 MG PO TABS
600.0000 mg | ORAL_TABLET | Freq: Once | ORAL | Status: AC
  Administered 2017-09-05: 600 mg via ORAL
  Filled 2017-09-05: qty 3

## 2017-09-05 MED ORDER — ONDANSETRON 4 MG PO TBDP
4.0000 mg | ORAL_TABLET | Freq: Once | ORAL | Status: AC
  Administered 2017-09-05: 4 mg via ORAL
  Filled 2017-09-05: qty 1

## 2017-09-05 MED ORDER — PHENAZOPYRIDINE HCL 200 MG PO TABS: 200.0000 mg | ORAL_TABLET | Freq: Three times a day (TID) | ORAL | 0 refills | Status: DC | PRN

## 2017-09-05 NOTE — ED Notes (Signed)
Bed: WA03 Expected date:  Expected time:  Means of arrival:  Comments: 

## 2017-09-05 NOTE — ED Triage Notes (Signed)
Pt complaint of pelvic pain and frequent urination for a week.

## 2017-09-05 NOTE — ED Provider Notes (Signed)
[facility]-EMERGENCY DEPT Provider Note   CSN: [id number] Arrival date & time: 09/05/17  0746     History   Chief Complaint Chief Complaint  Patient presents with  . Pelvic Pain    HPI Leah [name] is a 22 y.o. female.  22 year old female with past medical history including bipolar disorder, cholecystectomy, GERD, pseudoseizures, PVD who presents with pelvic pain and urinary frequency.  Patient reports at least 1 week of increased urinary frequency as well as 2 weeks of constant suprapubic abdominal pain.  The pain does not change with eating.  She reports nausea that she has had for at least 1 month, was evaluated in the ED previously for this and given Zofran.  She denies any associated vomiting, diarrhea, dysuria, hematuria, vaginal bleeding, or vaginal discharge.  She uses metronidazole gel for BV prevention.  She is sexually active with one partner and are trying to conceive.  She last received Depo-Provera shot July of last year, has not had a period in many months.  No cough/cold symptoms or fevers.   The history is provided by the patient.  Pelvic Pain     Past Medical History:  Diagnosis Date  . Anemia 2013  . Anxiety 2011   no meds during pregnancy  . Bipolar 1 disorder (HCC)   . Chlamydia   . Gall stones   . GERD (gastroesophageal reflux disease)   . 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 Seizure [facility]) 2011   pseudoseizures    Patient Active Problem List   Diagnosis Date Noted  . 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 UTI (urinary tract infection) 08/19/2016  . Suicide attempt by drug ingestion (HCC)   . Contraception 03/10/2014  . Biliary colic 03/10/2014  . BV (bacterial vaginosis) 03/10/2014  . Depression 03/16/2013  . Allergic rhinitis, seasonal 01/26/2013  . Anemia, iron deficiency 12/01/2012    Past Surgical History:  Procedure Laterality Date  .  CHOLECYSTECTOMY N/A 09/28/2014   Procedure: LAPAROSCOPIC CHOLECYSTECTOMY;  Surgeon: Axel Filler, MD;  Location: [facility] OR;  Service: General;  Laterality: N/A;  . CHOLECYSTECTOMY    . DIRECT LARYNGOSCOPY N/A 04/24/2014   Procedure: DIRECT LARYNGOSCOPY;  Surgeon: [name], MD;  Location: [facility] OR;  Service: ENT;  Laterality: N/A;  . FOREIGN BODY REMOVAL ESOPHAGEAL N/A 04/24/2014   Procedure: REMOVAL FOREIGN BODY ESOPHAGEAL;  Surgeon: [name], MD;  Location: [facility] OR;  Service: ENT;  Laterality: N/A;  . WISDOM TOOTH EXTRACTION  2013    OB History    Gravida Para Term Preterm AB Living   1 1 1  0 0 1   SAB TAB Ectopic Multiple Live Births   0 0 0   1       Home Medications    Prior to Admission medications   Medication Sig Start Date End Date Taking? Authorizing Provider  Aspirin-Acetaminophen-Caffeine (GOODYS EXTRA STRENGTH PO) Take 1 each by mouth 2 (two) times daily as needed (pain.).   Yes [provider]  dicyclomine (BENTYL) 20 MG tablet Take 1 tablet (20 mg total) by mouth 2 (two) times daily. 08/14/17  Yes Alvira Monday, MD  HYDROcodone-acetaminophen (NORCO/VICODIN) 5-325 MG tablet Take 1 tablet by mouth every 6 (six) hours as needed for moderate pain.   Yes [provider]  lamoTRIgine (LAMICTAL) 25 MG tablet Take 25 mg by mouth at bedtime.   Yes [provider]  mirtazapine (  REMERON) 15 MG tablet Take 15 mg by mouth at bedtime.   Yes [provider]  ondansetron (ZOFRAN ODT) 4 MG disintegrating tablet Take 1 tablet (4 mg total) by mouth every 8 (eight) hours as needed for nausea or vomiting. 08/14/17  Yes Alvira Monday, MD  risperiDONE (RISPERDAL) 2 MG tablet Take 2 mg by mouth at bedtime.   Yes [provider]  azithromycin (ZITHROMAX) 250 MG tablet Take 1 tablet (250 mg total) daily by mouth. Take first 2 tablets together, then 1 every day until finished. Patient not taking: Reported on 07/11/2017 06/08/17   Muthersbaugh, Dahlia Client, PA-C   benzonatate (TESSALON PERLES) 100 MG capsule Take 1 capsule (100 mg total) 3 (three) times daily as needed by mouth for cough (cough). Patient not taking: Reported on 07/11/2017 06/08/17   Muthersbaugh, Dahlia Client, PA-C  cephALEXin (KEFLEX) 500 MG capsule Take 1 capsule (500 mg total) by mouth 4 (four) times daily. Patient not taking: Reported on 09/05/2017 07/08/17   Demetrios Loll T, PA-C  fluticasone [facility]) 50 MCG/ACT nasal spray Place 2 sprays daily into both nostrils. Patient not taking: Reported on 07/11/2017 06/08/17   Muthersbaugh, Dahlia Client, PA-C  guaiFENesin (MUCINEX) 600 MG 12 hr tablet Take 2 tablets (1,200 mg total) 2 (two) times daily by mouth. Patient not taking: Reported on 07/11/2017 06/08/17   Muthersbaugh, Dahlia Client, PA-C  ibuprofen (ADVIL,MOTRIN) 600 MG tablet Take 1 tablet (600 mg total) by mouth every 6 (six) hours as needed. Patient not taking: Reported on 09/05/2017 04/07/17   Audry Pili, PA-C  naproxen (NAPROSYN) 500 MG tablet Take 1 tablet (500 mg total) by mouth 2 (two) times daily with a meal. Patient not taking: Reported on 09/05/2017 08/23/17   Casey Burkitt, MD  ondansetron (ZOFRAN) 4 MG tablet Take 1 tablet (4 mg total) by mouth every 6 (six) hours. Patient not taking: Reported on 09/05/2017 04/07/17   Audry Pili, PA-C  phenazopyridine (PYRIDIUM) 200 MG tablet Take 1 tablet (200 mg total) by mouth 3 (three) times daily as needed for pain. 09/05/17   Birdena Kingma, Ambrose Finland, MD  terbinafine (LAMISIL AT) 1 % cream Apply 1 application topically 2 (two) times daily. Patient not taking: Reported on 09/05/2017 07/08/17   Rise Mu, PA-C    Family History Family History  Problem Relation Age of Onset  . Arthritis Mother   . Bronchitis Mother   . Asthma Mother   . Hearing loss Paternal Grandfather   . Cancer Neg Hx   . Diabetes Neg Hx     Social History Social History   Tobacco Use  . Smoking status: Never Smoker  . Smokeless tobacco: Never Used    Substance Use Topics  . Alcohol use: No    Alcohol/week: 3.6 oz    Types: 6 Cans of beer per week  . Drug use: No     Allergies   Bactrim [sulfamethoxazole-trimethoprim]   Review of Systems Review of Systems  Genitourinary: Positive for pelvic pain.   All other systems reviewed and are negative except that which was mentioned in HPI   Physical Exam Updated Vital Signs BP (!) 92/59 (BP Location: Right Arm)   Pulse 71   Temp 98.2 F (36.8 C) (Oral)   Resp 18   Ht 5' (1.524 m)   Wt 47.6 kg (105 lb)   SpO2 100%   BMI 20.51 kg/m   Physical Exam  Constitutional: She is oriented to person, place, and time. She appears well-developed and well-nourished. No distress.  HENT:  Head: Normocephalic and atraumatic.  Mouth/Throat: Oropharynx is clear and moist.  Moist mucous membranes  Eyes: Conjunctivae are normal.  Neck: Neck supple.  Cardiovascular: Normal rate, regular rhythm and normal heart sounds.  No murmur heard. Pulmonary/Chest: Effort normal and breath sounds normal.  Abdominal: Soft. Bowel sounds are normal. She exhibits no distension. There is tenderness in the suprapubic area. There is no rebound and no guarding.  Genitourinary: Vagina normal and uterus normal. Cervix exhibits discharge (mild thin white). Cervix exhibits no motion tenderness and no friability. Right adnexum displays no mass and no tenderness. Left adnexum displays no mass and no tenderness.  Musculoskeletal: She exhibits no edema.  Neurological: She is alert and oriented to person, place, and time.  Fluent speech  Skin: Skin is warm and dry.  Psychiatric: She has a normal mood and affect. Judgment normal.  Nursing note and vitals reviewed.  Chaperone was present during exam.   ED Treatments / Results  Labs (all labs ordered are listed, but only abnormal results are displayed) Labs Reviewed  URINALYSIS, ROUTINE W REFLEX MICROSCOPIC - Abnormal; Notable for the following components:       Result Value   Ketones, ur 5 (*)    Leukocytes, UA TRACE (*)    Bacteria, UA RARE (*)    Squamous Epithelial / LPF 0-5 (*)    All other components within normal limits  WET PREP, GENITAL  URINE CULTURE  POC URINE PREG, ED  CBG MONITORING, ED  GC/CHLAMYDIA PROBE AMP ([facility]    EKG  EKG Interpretation None       Radiology No results found.  Procedures Procedures (including critical care time)  Medications Ordered in ED Medications  ondansetron (ZOFRAN-ODT) disintegrating tablet 4 mg (4 mg Oral Given 09/05/17 1021)  ibuprofen (ADVIL,MOTRIN) tablet 600 mg (600 mg Oral Given 09/05/17 1021)     Initial Impression / Assessment and Plan / ED Course  I have reviewed the triage vital signs and the nursing notes.  Pertinent labs that were available during my care of the patient were reviewed by me and considered in my medical decision making (see chart for details).     Patient well-appearing on exam, mild suprapubic abdominal tenderness but no rebound or guarding.  Normal vital signs.  UA shows only trace leukocytes, small amount of WBCs and bacteria but also some squamous epithelial cells.  Her urine does not overwhelmingly suggest infection.  I discussed other possibilities such as interstitial cystitis and recommended a trial of Pyridium to see if her symptoms improve.  Regarding her pelvic pain, given several weeks of symptoms and no other associated vomiting, diarrhea, or fevers, I highly doubt appendicitis, bowel obstruction, ovarian torsion, or other life-threatening process.  I do not feel she needs any imaging at this time.  I strongly recommended that she follow-up with OB/GYN especially given that she has not had a period in many months while off of Depo-Provera. Extensively reviewed return precautions.  Final Clinical Impressions(s) / ED Diagnoses   Final diagnoses:  Pelvic pain in female  Urinary frequency    ED Discharge Orders        Ordered     phenazopyridine (PYRIDIUM) 200 MG tablet  3 times daily PRN     09/05/17 1104       Shaneque Merkle, Ambrose Finlandachel Morgan, MD 09/05/17 1749

## 2017-09-06 LAB — URINE CULTURE: Special Requests: NORMAL

## 2017-09-08 LAB — GC/CHLAMYDIA PROBE AMP (~~LOC~~) NOT AT ARMC
Chlamydia: POSITIVE — AB
NEISSERIA GONORRHEA: NEGATIVE

## 2017-09-09 ENCOUNTER — Encounter (HOSPITAL_COMMUNITY): Payer: Self-pay | Admitting: Family Medicine

## 2017-09-09 ENCOUNTER — Encounter: Payer: Self-pay | Admitting: Gastroenterology

## 2017-09-09 ENCOUNTER — Ambulatory Visit: Payer: Medicaid Other | Admitting: Gastroenterology

## 2017-09-09 ENCOUNTER — Emergency Department (HOSPITAL_COMMUNITY)
Admission: EM | Admit: 2017-09-09 | Discharge: 2017-09-09 | Disposition: A | Discharge status: Home / Self Care | Payer: Medicaid Other | Attending: Emergency Medicine | Admitting: Emergency Medicine

## 2017-09-09 VITALS — BP 80/62 | HR 86 | Ht 60.0 in | Wt 103.6 lb

## 2017-09-09 DIAGNOSIS — K5900 Constipation, unspecified: Secondary | ICD-10-CM | POA: Diagnosis not present

## 2017-09-09 DIAGNOSIS — A749 Chlamydial infection, unspecified: Secondary | ICD-10-CM | POA: Diagnosis not present

## 2017-09-09 DIAGNOSIS — Z202 Contact with and (suspected) exposure to infections with a predominantly sexual mode of transmission: Secondary | ICD-10-CM | POA: Diagnosis present

## 2017-09-09 DIAGNOSIS — K219 Gastro-esophageal reflux disease without esophagitis: Secondary | ICD-10-CM

## 2017-09-09 DIAGNOSIS — Z79899 Other long term (current) drug therapy: Secondary | ICD-10-CM | POA: Insufficient documentation

## 2017-09-09 DIAGNOSIS — R1013 Epigastric pain: Secondary | ICD-10-CM | POA: Diagnosis not present

## 2017-09-09 DIAGNOSIS — R197 Diarrhea, unspecified: Secondary | ICD-10-CM

## 2017-09-09 DIAGNOSIS — R112 Nausea with vomiting, unspecified: Secondary | ICD-10-CM

## 2017-09-09 MED ORDER — OMEPRAZOLE 40 MG PO CPDR: 40.0000 mg | DELAYED_RELEASE_CAPSULE | Freq: Every day | ORAL | 11 refills | Status: DC

## 2017-09-09 MED ORDER — CEFTRIAXONE SODIUM 250 MG IJ SOLR
250.0000 mg | Freq: Once | INTRAMUSCULAR | Status: AC
  Administered 2017-09-09: 250 mg via INTRAMUSCULAR
  Filled 2017-09-09: qty 250

## 2017-09-09 MED ORDER — STERILE WATER FOR INJECTION IJ SOLN
INTRAMUSCULAR | Status: AC
  Administered 2017-09-09: 2.1 mL
  Filled 2017-09-09: qty 10

## 2017-09-09 MED ORDER — GLYCOPYRROLATE 1 MG PO TABS: 1.0000 mg | ORAL_TABLET | Freq: Two times a day (BID) | ORAL | 11 refills | Status: DC

## 2017-09-09 MED ORDER — AZITHROMYCIN 250 MG PO TABS
1000.0000 mg | ORAL_TABLET | Freq: Once | ORAL | Status: AC
  Administered 2017-09-09: 1000 mg via ORAL
  Filled 2017-09-09: qty 4

## 2017-09-09 NOTE — Patient Instructions (Signed)
We have sent the following medications to your pharmacy for you to pick up at your convenience: omeprazole and glycopyrrolate.   Patient advised to avoid spicy, acidic, citrus, chocolate, mints, fruit and fruit juices.  Limit the intake of caffeine, alcohol and Soda.  Don't exercise too soon after eating.  Don't lie down within 3-4 hours of eating.  Elevate the head of your bed.  You can start Miralax over the counter mixing 17 grams in 8 oz of water daily.   You have been scheduled for a CT scan of the abdomen and pelvis at [facility] ([address]---this is in the same building as Press [facility]).   You are scheduled on 09/18/17 at 10:00am. You should arrive 15 minutes prior to your appointment time for registration. Please follow the written instructions below on the day of your exam:  WARNING: IF YOU ARE ALLERGIC TO IODINE/X-RAY DYE, PLEASE NOTIFY RADIOLOGY IMMEDIATELY AT [phone]! YOU WILL BE GIVEN A 13 HOUR PREMEDICATION PREP.  1) Do not eat or drink anything after 6:00am (4 hours prior to your test) 2) You have been given 2 bottles of oral contrast to drink. The solution may taste               better if refrigerated, but do NOT add ice or any other liquid to this solution. Shake             well before drinking.    Drink 1 bottle of contrast @ 8:00am (2 hours prior to your exam)  Drink 1 bottle of contrast @ 9:00am (1 hour prior to your exam)  You may take any medications as prescribed with a small amount of water except for the following: Metformin, Glucophage, Glucovance, Avandamet, Riomet, Fortamet, Actoplus Met, Janumet, Glumetza or Metaglip. The above medications must be held the day of the exam AND 48 hours after the exam.  The purpose of you drinking the oral contrast is to aid in the visualization of your intestinal tract. The contrast solution may cause some diarrhea. Before your exam is started, you will be given a small amount of fluid to drink.  Depending on your individual set of symptoms, you may also receive an intravenous injection of x-ray contrast/dye. Plan on being at [facility] for 30 minutes or longer, depending on the type of exam you are having performed.  This test typically takes 30-45 minutes to complete.  If you have any questions regarding your exam or if you need to reschedule, you may call the CT department at [phone] between the hours of 8:00 am and 5:00 pm, Monday-Friday.  ________________________________________________________________________  Thank you for choosing me and [facility].  Pricilla Riffle. Dagoberto Ligas., MD., Marval Regal

## 2017-09-09 NOTE — ED Provider Notes (Signed)
[facility]-EMERGENCY DEPT Provider Note   CSN: [id number] Arrival date & time: 09/09/17  1453     History   Chief Complaint Chief Complaint  Patient presents with  . SEXUALLY TRANSMITTED DISEASE    HPI [name] MARIBELL DEMEO is a 22 y.o. female.  HPI   [name] ARNOLA CRITTENDON is a 22 y.o. female, with a history of bipolar and GERD, presenting to the ED for treatment of chlamydia.  States she was seen 4 days ago for several weeks of pelvic pain.  She denies abnormal vaginal discharge or bleeding, fever/chills, nausea/vomiting, current pelvic or abdominal pain, current dysuria, or any other complaints.     Past Medical History:  Diagnosis Date  . Anemia 2013  . Anxiety 2011   no meds during pregnancy  . Bipolar 1 disorder (HCC)   . Chlamydia   . Gall stones   . GERD (gastroesophageal reflux disease)   . 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 Seizure [facility]) 2011   pseudoseizures    Patient Active Problem List   Diagnosis Date Noted  . 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 UTI (urinary tract infection) 08/19/2016  . Suicide attempt by drug ingestion (HCC)   . Contraception 03/10/2014  . Biliary colic 03/10/2014  . BV (bacterial vaginosis) 03/10/2014  . Depression 03/16/2013  . Allergic rhinitis, seasonal 01/26/2013  . Anemia, iron deficiency 12/01/2012    Past Surgical History:  Procedure Laterality Date  . CHOLECYSTECTOMY N/A 09/28/2014   Procedure: LAPAROSCOPIC CHOLECYSTECTOMY;  Surgeon: Axel Filler, MD;  Location: [facility] OR;  Service: General;  Laterality: N/A;  . CHOLECYSTECTOMY    . DIRECT LARYNGOSCOPY N/A 04/24/2014   Procedure: DIRECT LARYNGOSCOPY;  Surgeon: [name], MD;  Location: [facility] OR;  Service: ENT;  Laterality: N/A;  . FOREIGN BODY REMOVAL ESOPHAGEAL N/A 04/24/2014   Procedure: REMOVAL FOREIGN BODY ESOPHAGEAL;  Surgeon: [name], MD;  Location: [facility] OR;  Service: ENT;   Laterality: N/A;  . WISDOM TOOTH EXTRACTION  2013    OB History    Gravida Para Term Preterm AB Living   1 1 1  0 0 1   SAB TAB Ectopic Multiple Live Births   0 0 0   1       Home Medications    Prior to Admission medications   Medication Sig Start Date End Date Taking? Authorizing Provider  lamoTRIgine (LAMICTAL) 25 MG tablet Take 25 mg by mouth at bedtime.   Yes [provider]  mirtazapine (REMERON) 15 MG tablet Take 15 mg by mouth at bedtime.   Yes [provider]  ondansetron (ZOFRAN) 4 MG tablet Take 1 tablet (4 mg total) by mouth every 6 (six) hours. 04/07/17  Yes Audry Pili, PA-C  risperiDONE (RISPERDAL) 2 MG tablet Take 2 mg by mouth at bedtime.   Yes [provider]  dicyclomine (BENTYL) 20 MG tablet Take 1 tablet (20 mg total) by mouth 2 (two) times daily. Patient not taking: Reported on 09/09/2017 08/14/17   Alvira Monday, MD  glycopyrrolate (ROBINUL) 1 MG tablet Take 1 tablet (1 mg total) by mouth 2 (two) times daily. Patient not taking: Reported on 09/09/2017 09/09/17   Meryl Dare, MD  omeprazole (PRILOSEC) 40 MG capsule Take 1 capsule (40 mg total) by mouth daily. Patient not taking: Reported on 09/09/2017 09/09/17   Meryl Dare, MD  phenazopyridine (PYRIDIUM) 200 MG tablet Take 1  tablet (200 mg total) by mouth 3 (three) times daily as needed for pain. Patient not taking: Reported on 09/09/2017 09/05/17   Little, Ambrose Finlandachel Morgan, MD  terbinafine (LAMISIL AT) 1 % cream Apply 1 application topically 2 (two) times daily. Patient not taking: Reported on 09/09/2017 07/08/17   Rise MuLeaphart, Kenneth T, PA-C    Family History Family History  Problem Relation Age of Onset  . Arthritis Mother   . Bronchitis Mother   . Asthma Mother   . Hearing loss Paternal Grandfather   . Cancer Neg Hx   . Diabetes Neg Hx   . Stomach cancer Neg Hx   . Colon cancer Neg Hx     Social History Social History   Tobacco Use  . Smoking status: Never Smoker  .  Smokeless tobacco: Never Used  Substance Use Topics  . Alcohol use: No    Alcohol/week: 3.6 oz    Types: 6 Cans of beer per week  . Drug use: Yes    Types: Marijuana     Allergies   Bactrim [sulfamethoxazole-trimethoprim]   Review of Systems Review of Systems  Constitutional: Negative for chills, diaphoresis and fever.  Gastrointestinal: Negative for abdominal pain, nausea and vomiting.  Genitourinary: Negative for dysuria, flank pain, hematuria, pelvic pain, vaginal bleeding and vaginal discharge.       Positive chlamydia test     Physical Exam Updated Vital Signs BP 106/63   Pulse 80   Temp 98.6 F (37 C)   Resp 15   SpO2 100%   Physical Exam  Constitutional: She appears well-developed and well-nourished. No distress.  HENT:  Head: Normocephalic and atraumatic.  Eyes: Conjunctivae are normal.  Neck: Neck supple.  Cardiovascular: Normal rate and regular rhythm.  Pulmonary/Chest: Effort normal.  Abdominal: Soft. There is no tenderness.  Neurological: She is alert.  Skin: Skin is warm and dry. She is not diaphoretic. No pallor.  Psychiatric: She has a normal mood and affect. Her behavior is normal.  Nursing note and vitals reviewed.    ED Treatments / Results  Labs (all labs ordered are listed, but only abnormal results are displayed) Labs Reviewed  HIV ANTIBODY (ROUTINE TESTING)  RPR    EKG  EKG Interpretation None       Radiology No results found.  Procedures Procedures (including critical care time)  Medications Ordered in ED Medications  cefTRIAXone (ROCEPHIN) injection 250 mg (not administered)  azithromycin (ZITHROMAX) tablet 1,000 mg (not administered)     Initial Impression / Assessment and Plan / ED Course  I have reviewed the triage vital signs and the nursing notes.  Pertinent labs & imaging results that were available during my care of the patient were reviewed by me and considered in my medical decision making (see chart for  details).     Patient presents for treatment of chlamydia.  This was initiated. Since she has no other current symptoms, my suspicion for PID is low.  Educated patient on transmission as well as safe sex practices.  Return precautions discussed.  Voices understanding.     Final Clinical Impressions(s) / ED Diagnoses   Final diagnoses:  Chlamydia    ED Discharge Orders    None       Concepcion LivingJoy, Caylen Yardley C, PA-C 09/09/17 1549    Benjiman CorePickering, Nathan, MD 09/09/17 [phone]

## 2017-09-09 NOTE — ED Triage Notes (Signed)
[facility] on 09/05/2017 for urinary symptoms and pelvic pain. Patient reports she received a call today about being treated for Chlamydia. She reports during her pelvic exam, patient had no signs of the STD. Symptoms have provided except for the pelvic pain.

## 2017-09-09 NOTE — Discharge Instructions (Signed)
You tested positive for chlamydia. This has been treated in the ED today.  Be sure to follow safe sex practices, including monogamy and/or condom use. All sexual partners must also be notified and treated.  Any future STD testing or treatment should be performed at the [facility] or primary care office. For the treatment to be fully effective, avoid all sexual contact for at least 2 weeks after medication administration.

## 2017-09-09 NOTE — Progress Notes (Signed)
History of Present Illness: This is a 22 year old referred by Alvira MondayErin Schlossman, MD for the evaluation of epigastric pain, nausea, vomiting, heartburn, constipation and diarrhea. She has bipolar disorder, GERD and a prior cholecystectomy.  She relates problems with epigastric abdominal pain and alternating diarrhea and constipation since her cholecystectomy.  She was in a diarrhea phase during January, evaluated in the ED and I reviewed the records.  GI pathogen panel was negative and urine drug screen was positive for cocaine. She has ongoing pelvic pain and has a GYN appointment on Friday.  She was given prescriptions for Zofran and Bentyl.  She does not feel the Bentyl was helpful for her abdominal pain so she discontinued it.  She relates frequent heartburn and episodes of nausea and vomiting.  She has not been taking medications for GERD. Denies weight loss, change in stool caliber, melena, hematochezia, dysphagia, chest pain.    Allergies  Allergen Reactions  . Bactrim [Sulfamethoxazole-Trimethoprim] Rash   Outpatient Medications Prior to Visit  Medication Sig Dispense Refill  . dicyclomine (BENTYL) 20 MG tablet Take 1 tablet (20 mg total) by mouth 2 (two) times daily. 20 tablet 0  . lamoTRIgine (LAMICTAL) 25 MG tablet Take 25 mg by mouth at bedtime.    . mirtazapine (REMERON) 15 MG tablet Take 15 mg by mouth at bedtime.    . ondansetron (ZOFRAN) 4 MG tablet Take 1 tablet (4 mg total) by mouth every 6 (six) hours. 12 tablet 0  . phenazopyridine (PYRIDIUM) 200 MG tablet Take 1 tablet (200 mg total) by mouth 3 (three) times daily as needed for pain. 8 tablet 0  . risperiDONE (RISPERDAL) 2 MG tablet Take 2 mg by mouth at bedtime.    . terbinafine (LAMISIL AT) 1 % cream Apply 1 application topically 2 (two) times daily. 30 g 0  . Aspirin-Acetaminophen-Caffeine (GOODYS EXTRA STRENGTH PO) Take 1 each by mouth 2 (two) times daily as needed (pain.).    Marland Kitchen. HYDROcodone-acetaminophen (NORCO/VICODIN)  5-325 MG tablet Take 1 tablet by mouth every 6 (six) hours as needed for moderate pain.    Marland Kitchen. ibuprofen (ADVIL,MOTRIN) 600 MG tablet Take 1 tablet (600 mg total) by mouth every 6 (six) hours as needed. 30 tablet 0  . naproxen (NAPROSYN) 500 MG tablet Take 1 tablet (500 mg total) by mouth 2 (two) times daily with a meal. 20 tablet 0  . ondansetron (ZOFRAN ODT) 4 MG disintegrating tablet Take 1 tablet (4 mg total) by mouth every 8 (eight) hours as needed for nausea or vomiting. 20 tablet 0  . azithromycin (ZITHROMAX) 250 MG tablet Take 1 tablet (250 mg total) daily by mouth. Take first 2 tablets together, then 1 every day until finished. (Patient not taking: Reported on 07/11/2017) 6 tablet 0  . benzonatate (TESSALON PERLES) 100 MG capsule Take 1 capsule (100 mg total) 3 (three) times daily as needed by mouth for cough (cough). (Patient not taking: Reported on 07/11/2017) 20 capsule 0  . cephALEXin (KEFLEX) 500 MG capsule Take 1 capsule (500 mg total) by mouth 4 (four) times daily. (Patient not taking: Reported on 09/05/2017) 20 capsule 0  . fluticasone (FLONASE) 50 MCG/ACT nasal spray Place 2 sprays daily into both nostrils. (Patient not taking: Reported on 07/11/2017) 9.9 g 2  . guaiFENesin (MUCINEX) 600 MG 12 hr tablet Take 2 tablets (1,200 mg total) 2 (two) times daily by mouth. (Patient not taking: Reported on 07/11/2017) 20 tablet 0   No facility-administered medications prior to  visit.    Past Medical History:  Diagnosis Date  . Anemia 2013  . Anxiety 2011   no meds during pregnancy  . Bipolar 1 disorder (HCC)   . Chlamydia   . Gall stones   . GERD (gastroesophageal reflux disease)   . 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 Seizure [facility]) 2011   pseudoseizures   Past Surgical History:  Procedure Laterality Date  . CHOLECYSTECTOMY N/A 09/28/2014   Procedure: LAPAROSCOPIC CHOLECYSTECTOMY;  Surgeon: Axel Filler, MD;  Location: [facility] OR;  Service: General;  Laterality:  N/A;  . CHOLECYSTECTOMY    . DIRECT LARYNGOSCOPY N/A 04/24/2014   Procedure: DIRECT LARYNGOSCOPY;  Surgeon: [name], MD;  Location: [facility] OR;  Service: ENT;  Laterality: N/A;  . FOREIGN BODY REMOVAL ESOPHAGEAL N/A 04/24/2014   Procedure: REMOVAL FOREIGN BODY ESOPHAGEAL;  Surgeon: [name], MD;  Location: [facility] OR;  Service: ENT;  Laterality: N/A;  . WISDOM TOOTH EXTRACTION  2013   Social History   Socioeconomic History  . Marital status: Single    Spouse name: None  . Number of children: 1  . Years of education: 71 th   . Highest education level: None  Social Needs  . Financial resource strain: None  . Food insecurity - worry: None  . Food insecurity - inability: None  . Transportation needs - medical: None  . Transportation needs - non-medical: None  Occupational History  . Occupation: Software engineer: Production manager  Tobacco Use  . Smoking status: Never Smoker  . Smokeless tobacco: Never Used  Substance and Sexual Activity  . Alcohol use: No    Alcohol/week: 3.6 oz    Types: 6 Cans of beer per week  . Drug use: Yes    Types: Marijuana  . Sexual activity: Yes    Partners: Male    Birth control/protection: Injection  Other Topics Concern  . None  Social History Narrative   ** Merged History Encounter **       Live with roommate and son.    Starting at [employer].    Family History  Problem Relation Age of Onset  . Arthritis Mother   . Bronchitis Mother   . Asthma Mother   . Hearing loss Paternal Grandfather   . Cancer Neg Hx   . Diabetes Neg Hx   . Stomach cancer Neg Hx   . Colon cancer Neg Hx        Review of Systems: Pertinent positive and negative review of systems were noted in the above HPI section. All other review of systems were otherwise negative.    Physical Exam: General: Well developed, well nourished, no acute distress Head: Normocephalic and atraumatic Eyes:  sclerae anicteric, EOMI Ears: Normal auditory acuity Mouth: No deformity or  lesions Neck: Supple, no masses or thyromegaly Lungs: Clear throughout to auscultation Heart: Regular rate and rhythm; no murmurs, rubs or bruits Abdomen: Soft, mild epigastric tenderness and non distended. No masses, hepatosplenomegaly or hernias noted. Normal Bowel sounds Rectal: not done Musculoskeletal: Symmetrical with no gross deformities  Skin: No lesions on visible extremities Pulses:  Normal pulses noted Extremities: No clubbing, cyanosis, edema or deformities noted Neurological: Alert oriented x 4, grossly nonfocal Cervical Nodes:  No significant cervical adenopathy Inguinal Nodes: No significant inguinal adenopathy Psychological:  Alert and cooperative. Flat affect  Assessment and Recommendations:  1.  Epigastric pain, GERD, nausea, vomiting, constipation and diarrhea.  Suspected  GERD, IBS and cocaine side effects. Discontinue cocaine usage.  Glycopyrrolate 1 mg twice daily.  Begin omeprazole 40 mg daily.  Follow standard antireflux measures.  Schedule abdominal/pelvic CT.  Consider EGD if epigastric pain, nausea, vomiting or GERD symptoms persist. Keep appointment with GYN for evaluation of pelvic pain. REV in 6 weeks.    cc: Alvira Monday, MD

## 2017-09-10 ENCOUNTER — Telehealth: Payer: Self-pay | Admitting: General Practice

## 2017-09-10 LAB — RPR: RPR: NONREACTIVE

## 2017-09-10 LAB — HIV ANTIBODY (ROUTINE TESTING W REFLEX): HIV SCREEN 4TH GENERATION: NONREACTIVE

## 2017-09-10 NOTE — Telephone Encounter (Signed)
Called patient in regards to appointment on 10/01/17 at 3:55pm.  Patient voiced understanding.

## 2017-09-12 ENCOUNTER — Other Ambulatory Visit: Payer: Self-pay

## 2017-09-12 ENCOUNTER — Encounter (HOSPITAL_COMMUNITY): Payer: Self-pay | Admitting: Obstetrics and Gynecology

## 2017-09-12 ENCOUNTER — Inpatient Hospital Stay (HOSPITAL_COMMUNITY)
Admission: AD | Admit: 2017-09-12 | Discharge: 2017-09-12 | Disposition: A | Discharge status: Home / Self Care | Payer: Medicaid Other | Source: Ambulatory Visit | Attending: Obstetrics & Gynecology | Admitting: Obstetrics & Gynecology

## 2017-09-12 ENCOUNTER — Ambulatory Visit: Payer: Self-pay | Admitting: Certified Nurse Midwife

## 2017-09-12 ENCOUNTER — Inpatient Hospital Stay (HOSPITAL_COMMUNITY): Payer: Medicaid Other

## 2017-09-12 DIAGNOSIS — Z79899 Other long term (current) drug therapy: Secondary | ICD-10-CM | POA: Diagnosis not present

## 2017-09-12 DIAGNOSIS — N912 Amenorrhea, unspecified: Secondary | ICD-10-CM | POA: Insufficient documentation

## 2017-09-12 DIAGNOSIS — Z87891 Personal history of nicotine dependence: Secondary | ICD-10-CM | POA: Diagnosis not present

## 2017-09-12 DIAGNOSIS — K219 Gastro-esophageal reflux disease without esophagitis: Secondary | ICD-10-CM | POA: Insufficient documentation

## 2017-09-12 DIAGNOSIS — R350 Frequency of micturition: Secondary | ICD-10-CM | POA: Insufficient documentation

## 2017-09-12 DIAGNOSIS — F419 Anxiety disorder, unspecified: Secondary | ICD-10-CM | POA: Insufficient documentation

## 2017-09-12 DIAGNOSIS — R102 Pelvic and perineal pain: Secondary | ICD-10-CM | POA: Diagnosis present

## 2017-09-12 DIAGNOSIS — F319 Bipolar disorder, unspecified: Secondary | ICD-10-CM | POA: Diagnosis not present

## 2017-09-12 DIAGNOSIS — Z9049 Acquired absence of other specified parts of digestive tract: Secondary | ICD-10-CM | POA: Insufficient documentation

## 2017-09-12 DIAGNOSIS — Z882 Allergy status to sulfonamides status: Secondary | ICD-10-CM | POA: Diagnosis not present

## 2017-09-12 DIAGNOSIS — N911 Secondary amenorrhea: Secondary | ICD-10-CM | POA: Diagnosis present

## 2017-09-12 LAB — URINALYSIS, ROUTINE W REFLEX MICROSCOPIC
BILIRUBIN URINE: NEGATIVE
Glucose, UA: NEGATIVE mg/dL
Hgb urine dipstick: NEGATIVE
Ketones, ur: NEGATIVE mg/dL
Leukocytes, UA: NEGATIVE
NITRITE: NEGATIVE
PH: 6 (ref 5.0–8.0)
Protein, ur: NEGATIVE mg/dL
SPECIFIC GRAVITY, URINE: 1.02 (ref 1.005–1.030)

## 2017-09-12 LAB — WET PREP, GENITAL
CLUE CELLS WET PREP: NONE SEEN
SPERM: NONE SEEN
TRICH WET PREP: NONE SEEN
Yeast Wet Prep HPF POC: NONE SEEN

## 2017-09-12 MED ORDER — IBUPROFEN 600 MG PO TABS: 600.0000 mg | ORAL_TABLET | Freq: Four times a day (QID) | ORAL | 0 refills | Status: DC | PRN

## 2017-09-12 MED ORDER — NORGESTIMATE-ETH ESTRADIOL 0.25-35 MG-MCG PO TABS: 1.0000 | ORAL_TABLET | Freq: Every day | ORAL | 11 refills | Status: DC

## 2017-09-12 MED ORDER — MEDROXYPROGESTERONE ACETATE 10 MG PO TABS: 10.0000 mg | ORAL_TABLET | Freq: Every day | ORAL | 0 refills | Status: DC

## 2017-09-12 MED ORDER — KETOROLAC TROMETHAMINE 60 MG/2ML IM SOLN
60.0000 mg | INTRAMUSCULAR | Status: AC
  Administered 2017-09-12: 60 mg via INTRAMUSCULAR
  Filled 2017-09-12: qty 2

## 2017-09-12 NOTE — MAU Note (Signed)
Having pelvic pain, frequency of urination.was seen at [facility] for same.  Was supposed to have appt with GYN today, had to be rescheduled, was told if still having pain to come here.

## 2017-09-12 NOTE — MAU Provider Note (Signed)
History     CSN: [id number]  Arrival date and time: 09/12/17 1218   First Provider Initiated Contact with Patient 09/12/17 1455      Chief Complaint  Patient presents with  . Pelvic Pain  . Urinary Frequency   HPI  Ms.  Leah [name] is a 22 y.o. year old G1P1011 female who is 9 days PP presents to MAU reporting pelvic pain, frequency of urination. She was seen on 2/15 at [facility]. She was called on 2/19 to come for tx for chlamydia. She received Rocephin and Zithromax on 2/19. She had an appt today with [facility], but was asked to reschedule until 3/13. She reports that the pelvic pain is unchanged and she wanted to be seen sooner than 3/13, so she came her to see an OB/GYN doctor. She denies any recent SI since being seen on 2/15. She reports having no period in "4 years". She has had negative HPT this week.  Past Medical History:  Diagnosis Date  . Anemia 2013  . Anxiety 2011   no meds during pregnancy  . Bipolar 1 disorder (HCC)   . Chlamydia   . Gall stones   . GERD (gastroesophageal reflux disease)   . 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 Seizure [facility]) 2011   pseudoseizures    Past Surgical History:  Procedure Laterality Date  . CHOLECYSTECTOMY N/A 09/28/2014   Procedure: LAPAROSCOPIC CHOLECYSTECTOMY;  Surgeon: Axel Filler, MD;  Location: [facility] OR;  Service: General;  Laterality: N/A;  . CHOLECYSTECTOMY    . DIRECT LARYNGOSCOPY N/A 04/24/2014   Procedure: DIRECT LARYNGOSCOPY;  Surgeon: [name], MD;  Location: [facility] OR;  Service: ENT;  Laterality: N/A;  . FOREIGN BODY REMOVAL ESOPHAGEAL N/A 04/24/2014   Procedure: REMOVAL FOREIGN BODY ESOPHAGEAL;  Surgeon: [name], MD;  Location: [facility] OR;  Service: ENT;  Laterality: N/A;  . WISDOM TOOTH EXTRACTION  2013    Family History  Problem Relation Age of Onset  . Arthritis Mother   . Bronchitis Mother   . Asthma Mother   . Hearing loss Paternal Grandfather   . Cancer Neg Hx   . Diabetes Neg Hx   . Stomach  cancer Neg Hx   . Colon cancer Neg Hx     Social History   Tobacco Use  . Smoking status: Former Games developer  . Smokeless tobacco: Former Engineer, water Use Topics  . Alcohol use: No    Alcohol/week: 3.6 oz    Types: 6 Cans of beer per week  . Drug use: Yes    Types: Marijuana    Allergies:  Allergies  Allergen Reactions  . Bactrim [Sulfamethoxazole-Trimethoprim] Rash    Medications Prior to Admission  Medication Sig Dispense Refill Last Dose  . lamoTRIgine (LAMICTAL) 25 MG tablet Take 25 mg by mouth at bedtime.   09/11/2017 at Unknown time  . mirtazapine (REMERON) 15 MG tablet Take 15 mg by mouth at bedtime.   09/11/2017 at Unknown time  . ondansetron (ZOFRAN) 4 MG tablet Take 1 tablet (4 mg total) by mouth every 6 (six) hours. 12 tablet 0 09/11/2017 at Unknown time  . risperiDONE (RISPERDAL) 2 MG tablet Take 2 mg by mouth at bedtime.   09/11/2017 at Unknown time  . dicyclomine (BENTYL) 20 MG tablet Take 1 tablet (20 mg total) by mouth 2 (two) times daily. (Patient not taking: Reported on 09/09/2017) 20 tablet 0 Not Taking at Unknown time  .  glycopyrrolate (ROBINUL) 1 MG tablet Take 1 tablet (1 mg total) by mouth 2 (two) times daily. (Patient not taking: Reported on 09/09/2017) 60 tablet 11 Not Taking at Unknown time  . omeprazole (PRILOSEC) 40 MG capsule Take 1 capsule (40 mg total) by mouth daily. (Patient not taking: Reported on 09/09/2017) 30 capsule 11 not started  . phenazopyridine (PYRIDIUM) 200 MG tablet Take 1 tablet (200 mg total) by mouth 3 (three) times daily as needed for pain. (Patient not taking: Reported on 09/09/2017) 8 tablet 0 not started  . terbinafine (LAMISIL AT) 1 % cream Apply 1 application topically 2 (two) times daily. (Patient not taking: Reported on 09/09/2017) 30 g 0 Not Taking at Unknown time    Review of Systems  Constitutional: Negative.   HENT: Negative.   Eyes: Negative.   Respiratory: Negative.   Cardiovascular: Negative.   Gastrointestinal: Negative.    Endocrine: Negative.   Genitourinary: Positive for pelvic pain.  Musculoskeletal: Negative.   Skin: Negative.   Allergic/Immunologic: Negative.   Neurological: Negative.   Hematological: Negative.   Psychiatric/Behavioral: Negative.    Physical Exam   Blood pressure (!) 108/55, pulse 84, temperature 99 F (37.2 C), temperature source Oral, resp. rate (!) 116, weight 106 lb 8 oz (48.3 kg), SpO2 100 %.  Physical Exam  Nursing note and vitals reviewed. Constitutional: She is oriented to person, place, and time. She appears well-developed and well-nourished.  HENT:  Head: Normocephalic and atraumatic.  Eyes: Pupils are equal, round, and reactive to light.  Neck: Normal range of motion.  Cardiovascular: Normal rate, regular rhythm and normal heart sounds.  Respiratory: Effort normal and breath sounds normal.  GI: Soft. Bowel sounds are normal.  Genitourinary:  Genitourinary Comments: Uterus: mildly tender, SE: cervix is smooth, pink, no lesions, small amt of thick, yellowish-white vaginal d/c, closed/long/firm, mild CMT, no friability, significant bilateral adnexal tenderness   Musculoskeletal: Normal range of motion.  Neurological: She is alert and oriented to person, place, and time.  Skin: Skin is warm and dry.  Psychiatric: She has a normal mood and affect. Her behavior is normal. Judgment and thought content normal.    MAU Course  Procedures  MDM Wet Prep Pelvis complete transabdominal and transvaginal Toradol 60 mg IM injection  *Consult with Dr. Macon Large @ 1530 - notified of patient's complaints, assessments, lab & U/S results, tx plan F/U sooner with [facility] MD - ok to d/c home, agrees with plan   Results for orders placed or performed during the hospital encounter of 09/12/17 (from the past 24 hour(s))  Urinalysis, Routine w reflex microscopic     Status: None   Collection Time: 09/12/17  1:04 PM  Result Value Ref Range   Color, Urine YELLOW YELLOW   APPearance CLEAR  CLEAR   Specific Gravity, Urine 1.020 1.005 - 1.030   pH 6.0 5.0 - 8.0   Glucose, UA NEGATIVE NEGATIVE mg/dL   Hgb urine dipstick NEGATIVE NEGATIVE   Bilirubin Urine NEGATIVE NEGATIVE   Ketones, ur NEGATIVE NEGATIVE mg/dL   Protein, ur NEGATIVE NEGATIVE mg/dL   Nitrite NEGATIVE NEGATIVE   Leukocytes, UA NEGATIVE NEGATIVE  Wet prep, genital     Status: Abnormal   Collection Time: 09/12/17  3:05 PM  Result Value Ref Range   Yeast Wet Prep HPF POC NONE SEEN NONE SEEN   Trich, Wet Prep NONE SEEN NONE SEEN   Clue Cells Wet Prep HPF POC NONE SEEN NONE SEEN   WBC, Wet Prep HPF POC FEW (  A) NONE SEEN   Sperm NONE SEEN    [place] Pelvic Complete With Transvaginal  Result Date: 09/12/2017 CLINICAL DATA:  Pelvic pain.  Chlamydia. EXAM: TRANSABDOMINAL AND TRANSVAGINAL ULTRASOUND OF PELVIS TECHNIQUE: Both transabdominal and transvaginal ultrasound examinations of the pelvis were performed. Transabdominal technique was performed for global imaging of the pelvis including uterus, ovaries, adnexal regions, and pelvic cul-de-sac. It was necessary to proceed with endovaginal exam following the transabdominal exam to visualize the endometrial stripe and ovaries. COMPARISON:  07/03/2015 FINDINGS: Uterus Measurements: 8.4 x 2.8 x 4.4 cm. No fibroids or other mass visualized. Endometrium Thickness: 5 mm.  No focal abnormality visualized. Right ovary Measurements: 3.0 x 1.5 x 2.1 cm. Normal appearance/no adnexal mass. Left ovary Measurements: 2.7 x 2.2 x 2.1 cm. Normal appearance/no adnexal mass. Other findings No abnormal free fluid. IMPRESSION: Normal appearance of uterus and ovaries. No pelvic mass or other significant abnormality identified. Electronically Signed   By: Myles RosenthalJohn  Stahl M.D.   On: 09/12/2017 16:11     Assessment and Plan  Amenorrhea, secondary  - Information provided on amenorrhea  - Rx for Provera 10 mg daily x 10 days - Rx for Sprintec daily (start 1st Sunday after period stops) - [facility]  to have patient seen sooner than 3/13  Pelvic pain  - Rx for Ibuprofen 600 mg every 6 hrs prn pain   Discharge home Patient verbalized an understanding of the plan of care and agrees.   Raelyn Moraolitta Renie Stelmach, MSN, CNM 09/12/2017, 3:07 PM

## 2017-09-12 NOTE — Discharge Instructions (Signed)
In late 2019, the [facility] will be moving to the [place] campus. At that time, the MAU (Maternity Admissions Unit), where you are being seen today, will no longer take care of non-pregnant patients. We strongly encourage you to find a doctor's office before that time, so that you can be seen with any GYN concerns, like vaginal discharge, urinary tract infection, etc.. in a timely manner. ° °In order to make an office visit more convenient, the [facility] will be offering evening hours with same-day appointments, walk-in appointments and scheduled appointments available during this time. ° °[facility] Hours: °Monday - 8am - 7:30 pm with walk-in between 4pm- 7:30 pm °Tuesday - 8 am - 5 pm (starting 10/21/17 we will be open late and accepting walk-ins from 4pm - 7:30pm) °Wednesday - 8 am - 5 pm (starting 01/21/18 we will be open late and accepting walk-ins from 4pm - 7:30pm) °Thursday 8 am - 5 pm (starting 04/23/18 we will be open late and accepting walk-ins from 4pm - 7:30pm) °Friday 8 am - 5 pm ° °For an appointment please call the [facility] at [phone] ° °For urgent needs, [facility] is also available for management of urgent GYN complaints such as vaginal discharge or urinary tract infections. ° ° ° ° ° °

## 2017-09-16 ENCOUNTER — Telehealth: Payer: Self-pay | Admitting: Gastroenterology

## 2017-09-16 DIAGNOSIS — R112 Nausea with vomiting, unspecified: Secondary | ICD-10-CM

## 2017-09-16 DIAGNOSIS — R1013 Epigastric pain: Secondary | ICD-10-CM

## 2017-09-16 NOTE — Telephone Encounter (Signed)
Patient notified that CT will need to be cancelled and [facility] is [facility] 09[facility]18/17 10:00.  She is notified to be NPO after midnight.

## 2017-09-16 NOTE — Telephone Encounter (Signed)
Dr. Stark please advise. 

## 2017-09-16 NOTE — Telephone Encounter (Signed)
CT cancelled with CT  Left message for patient to call back

## 2017-09-16 NOTE — Telephone Encounter (Signed)
CT ABDOMEN and PELVIS; with contrastDeniedBased on eviCore Abdomen Imaging Guidelines, we cannot approve this request. Guidelines support a recent ultrasound for the clinical indication(s) presented. Ultrasound may help confirm the diagnosis or may help determine the most appropriate next imaging test. Advanced imaging might be indicated when recent ultrasounds have been performed that are technically limited or non-diagnostic. The clinical information provided does not describe these results and, therefore, the request is not indicated at this time.

## 2017-09-16 NOTE — Telephone Encounter (Signed)
Schedule abd US since CT was denied

## 2017-09-18 ENCOUNTER — Other Ambulatory Visit: Payer: Self-pay

## 2017-09-18 ENCOUNTER — Ambulatory Visit (HOSPITAL_COMMUNITY)
Admission: RE | Admit: 2017-09-18 | Discharge: 2017-09-18 | Disposition: A | Discharge status: Home / Self Care | Payer: Medicaid Other | Source: Ambulatory Visit | Attending: Gastroenterology | Admitting: Gastroenterology

## 2017-09-18 DIAGNOSIS — Z9049 Acquired absence of other specified parts of digestive tract: Secondary | ICD-10-CM | POA: Diagnosis not present

## 2017-09-18 DIAGNOSIS — R112 Nausea with vomiting, unspecified: Secondary | ICD-10-CM

## 2017-09-18 DIAGNOSIS — R1013 Epigastric pain: Secondary | ICD-10-CM | POA: Diagnosis not present

## 2017-10-01 ENCOUNTER — Encounter: Payer: Self-pay | Admitting: Obstetrics and Gynecology

## 2017-10-01 ENCOUNTER — Other Ambulatory Visit (HOSPITAL_COMMUNITY)
Admission: RE | Admit: 2017-10-01 | Discharge: 2017-10-01 | Disposition: A | Discharge status: Home / Self Care | Payer: Medicaid Other | Source: Ambulatory Visit | Attending: Obstetrics and Gynecology | Admitting: Obstetrics and Gynecology

## 2017-10-01 ENCOUNTER — Ambulatory Visit (INDEPENDENT_AMBULATORY_CARE_PROVIDER_SITE_OTHER): Payer: Medicaid Other | Admitting: Obstetrics and Gynecology

## 2017-10-01 VITALS — BP 113/61 | HR 62 | Ht 60.0 in | Wt 103.7 lb

## 2017-10-01 DIAGNOSIS — Z124 Encounter for screening for malignant neoplasm of cervix: Secondary | ICD-10-CM

## 2017-10-01 DIAGNOSIS — N911 Secondary amenorrhea: Secondary | ICD-10-CM | POA: Diagnosis not present

## 2017-10-01 NOTE — Progress Notes (Signed)
22 yo G2P1011 here as an MAU follow up for the evaluation of amenorrhea. She reports persistent amenorrhea since her last dose of depo-provera in July 2018.  She was seen in MAU on 2/22 for the evaluation of amenorrhea and was prescribed provera. She reports taking it as prescribed and never experienced a withdrawal bleed. She has not started the prescribed COC. Patient is trying to conceive with her current partner. She denies any headaches, visual changes. She reports being able to express milk from her left breast.  She was recently treated for chlamydia by the health department approximately 3 weeks ago. Patient with a history of chronic abdominal pain associated with nausea and was recently diagnosed with IBS.   Past Medical History:  Diagnosis Date  . Anemia 2013  . Anxiety 2011   no meds during pregnancy  . Bipolar 1 disorder (HCC)   . Chlamydia   . Gall stones   . GERD (gastroesophageal reflux disease)   . 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 Seizure [facility]) 2011   pseudoseizures   Past Surgical History:  Procedure Laterality Date  . CHOLECYSTECTOMY N/A 09/28/2014   Procedure: LAPAROSCOPIC CHOLECYSTECTOMY;  Surgeon: Axel FillerArmando Ramirez, MD;  Location: [facility] OR;  Service: General;  Laterality: N/A;  . CHOLECYSTECTOMY    . DIRECT LARYNGOSCOPY N/A 04/24/2014   Procedure: DIRECT LARYNGOSCOPY;  Surgeon: Darletta MollSui W Teoh, MD;  [facility];  Service: ENT;  Laterality: N/A;  . FOREIGN BODY REMOVAL ESOPHAGEAL N/A 04/24/2014   Procedure: REMOVAL FOREIGN BODY ESOPHAGEAL;  Surgeon: Darletta MollSui W Teoh, MD;  [facility];  Service: ENT;  Laterality: N/A;  . WISDOM TOOTH EXTRACTION  2013   Family History  Problem Relation Age of Onset  . Arthritis Mother   . Bronchitis Mother   . Asthma Mother   . Hearing loss Paternal Grandfather   . Cancer Neg Hx   . Diabetes Neg Hx   . Stomach cancer Neg Hx   . Colon cancer Neg Hx    Social History   Tobacco Use  . Smoking status: Former  Games developermoker  . Smokeless tobacco: Former Engineer, waterUser  Substance Use Topics  . Alcohol use: No    Alcohol/week: 3.6 oz    Types: 6 Cans of beer per week  . Drug use: Yes    Types: Marijuana   ROS See pertinent in HPI  Blood pressure 113/61, pulse 62, height 5' (1.524 m), weight 103 lb 11.2 oz (47 kg). GENERAL: Well-developed, well-nourished female in no acute distress.  BREASTS: Symmetric in size. No palpable masses or lymphadenopathy, or skin changes. Milky discharge easily expressed from left breast ABDOMEN: Soft, nontender, nondistended. No organomegaly. PELVIC: Normal external female genitalia. Vagina is pink and rugated.  Normal discharge. Normal appearing cervix. Uterus is normal in size. No adnexal mass or tenderness. EXTREMITIES: No cyanosis, clubbing, or edema, 2+ distal pulses.   A/P 22 yo here for the evaluation of secondary amenorrhea - pap smear with cultures collected - TSH, FSH, LH and prolactin ordered - Patient advised to start birth control pills - Patient will be contacted with results and further management plan - Will likely need referral to endocrinology

## 2017-10-02 ENCOUNTER — Encounter (HOSPITAL_COMMUNITY): Payer: Self-pay | Admitting: Emergency Medicine

## 2017-10-02 ENCOUNTER — Ambulatory Visit (HOSPITAL_COMMUNITY)
Admission: EM | Admit: 2017-10-02 | Discharge: 2017-10-02 | Disposition: A | Discharge status: Home / Self Care | Payer: Medicaid Other | Attending: Internal Medicine | Admitting: Internal Medicine

## 2017-10-02 ENCOUNTER — Encounter: Payer: Self-pay | Admitting: Obstetrics and Gynecology

## 2017-10-02 DIAGNOSIS — R52 Pain, unspecified: Secondary | ICD-10-CM

## 2017-10-02 DIAGNOSIS — R51 Headache: Secondary | ICD-10-CM

## 2017-10-02 DIAGNOSIS — M791 Myalgia, unspecified site: Secondary | ICD-10-CM | POA: Diagnosis not present

## 2017-10-02 DIAGNOSIS — R519 Headache, unspecified: Secondary | ICD-10-CM

## 2017-10-02 LAB — LUTEINIZING HORMONE: LH: 14.8 m[IU]/mL

## 2017-10-02 LAB — TSH: TSH: 0.809 u[IU]/mL (ref 0.450–4.500)

## 2017-10-02 LAB — PROLACTIN: PROLACTIN: 18.9 ng/mL (ref 4.8–23.3)

## 2017-10-02 LAB — FOLLICLE STIMULATING HORMONE: FSH: 10.8 m[IU]/mL

## 2017-10-02 MED ORDER — KETOROLAC TROMETHAMINE 60 MG/2ML IM SOLN
60.0000 mg | Freq: Once | INTRAMUSCULAR | Status: AC
  Administered 2017-10-02: 60 mg via INTRAMUSCULAR

## 2017-10-02 MED ORDER — KETOROLAC TROMETHAMINE 60 MG/2ML IM SOLN
INTRAMUSCULAR | Status: AC
  Filled 2017-10-02: qty 2

## 2017-10-02 NOTE — ED Triage Notes (Signed)
PT reports body aches, headache, and lightheadedness that started this AM. Denies sore throat, NVD, and cough / congestion.

## 2017-10-02 NOTE — ED Provider Notes (Signed)
[facility]    CSN: [id number] Arrival date & time: 10/02/17  1635     History   Chief Complaint Chief Complaint  Patient presents with  . Generalized Body Aches    HPI Leah [name] is a 22 y.o. female.   Leah [name] presents with complaints of headache and body aches which started this morning. Has not improved but has not worsened. Feels light headed. Without cough, congestion, sore throat ear pain, fevers or back pain. States has abdominal pain which is not new for her. Denies urinary symptoms. Is on amoxicillin for a tooth which she is going to be seen for with a dentist. Has not taken any medication for pain today. Rates pain 8/10. Headache is frontal, achy sensation. Does not have nausea or vomiting. No injury. Without loss of consciousness. Eating and drinking. withou skin rash, joint swelling or pain. Son had the flu last week and has completed tamiflu for this. She has not gotten a flu shot this season. Negative pregnancy 2/15 last documented, was seen yesterday for amenorrhea and work up is being completed.     ROS per HPI.       Past Medical History:  Diagnosis Date  . Anemia 2013  . Anxiety 2011   no meds during pregnancy  . Bipolar 1 disorder (HCC)   . Chlamydia   . Gall stones   . GERD (gastroesophageal reflux disease)   . 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 Seizure [facility]) 2011   pseudoseizures    Patient Active Problem List   Diagnosis Date Noted  . Pelvic pain 09/12/2017  . Amenorrhea, secondary 09/12/2017  . 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 UTI (urinary tract infection) 08/19/2016  . Suicide attempt by drug ingestion (HCC)   . Contraception 03/10/2014  . Biliary colic 03/10/2014  . BV (bacterial vaginosis) 03/10/2014  . Depression 03/16/2013  . Allergic rhinitis, seasonal 01/26/2013  . Anemia, iron deficiency 12/01/2012    Past Surgical History:  Procedure  Laterality Date  . CHOLECYSTECTOMY N/A 09/28/2014   Procedure: LAPAROSCOPIC CHOLECYSTECTOMY;  Surgeon: Axel Filler, MD;  Location: [facility] OR;  Service: General;  Laterality: N/A;  . CHOLECYSTECTOMY    . DIRECT LARYNGOSCOPY N/A 04/24/2014   Procedure: DIRECT LARYNGOSCOPY;  Surgeon: [name], MD;  [facility];  Service: ENT;  Laterality: N/A;  . FOREIGN BODY REMOVAL ESOPHAGEAL N/A 04/24/2014   Procedure: REMOVAL FOREIGN BODY ESOPHAGEAL;  Surgeon: [name], MD;  Location: [facility] OR;  Service: ENT;  Laterality: N/A;  . WISDOM TOOTH EXTRACTION  2013    OB History    Gravida Para Term Preterm AB Living   2 1 1  0 1 1   SAB TAB Ectopic Multiple Live Births   1 0 0   1       Home Medications    Prior to Admission medications   Medication Sig Start Date End Date Taking? Authorizing Provider  amoxicillin (AMOXIL) 500 MG capsule Take 500 mg by mouth 3 (three) times daily.   Yes [provider]  glycopyrrolate (ROBINUL) 1 MG tablet Take 1 mg by mouth 3 (three) times daily.   Yes [provider]  ibuprofen (ADVIL,MOTRIN) 600 MG tablet Take 1 tablet (600 mg total) by mouth every 6 (six) hours as needed. 09/12/17  Yes [name], CNM  lamoTRIgine (LAMICTAL) 25 MG tablet Take 25 mg by mouth at bedtime.  Yes [provider]  mirtazapine (REMERON) 15 MG tablet Take 15 mg by mouth at bedtime.   Yes [provider]  ondansetron (ZOFRAN) 4 MG tablet Take 1 tablet (4 mg total) by mouth every 6 (six) hours. 04/07/17  Yes Audry Pili, PA-C  risperiDONE (RISPERDAL) 2 MG tablet Take 2 mg by mouth at bedtime.   Yes [provider]  traMADol (ULTRAM) 50 MG tablet Take by mouth every 6 (six) hours as needed.   Yes [provider]  ibuprofen (ADVIL,MOTRIN) 100 MG/5ML suspension Take 800 mg by mouth every 4 (four) hours as needed.    [provider]  medroxyPROGESTERone (PROVERA) 10 MG tablet Take 1 tablet (10 mg total) by mouth daily for 10 days.  09/12/17 09/22/17  [name], CNM  norgestimate-ethinyl estradiol (SPRINTEC 28) 0.25-35 MG-MCG tablet Take 1 tablet by mouth daily. Start first Sunday after period stops Patient not taking: Reported on 10/01/2017 09/12/17   [name], CNM  omeprazole (PRILOSEC) 40 MG capsule Take 1 capsule (40 mg total) by mouth daily. Patient not taking: Reported on 09/09/2017 09/09/17   Meryl Dare, MD  phenazopyridine (PYRIDIUM) 200 MG tablet Take 1 tablet (200 mg total) by mouth 3 (three) times daily as needed for pain. Patient not taking: Reported on 09/09/2017 09/05/17   Little, Ambrose Finland, MD    Family History Family History  Problem Relation Age of Onset  . Arthritis Mother   . Bronchitis Mother   . Asthma Mother   . Hearing loss Paternal Grandfather   . Cancer Neg Hx   . Diabetes Neg Hx   . Stomach cancer Neg Hx   . Colon cancer Neg Hx     Social History Social History   Tobacco Use  . Smoking status: Former Games developer  . Smokeless tobacco: Former Engineer, water Use Topics  . Alcohol use: No    Alcohol/week: 3.6 oz    Types: 6 Cans of beer per week  . Drug use: Yes    Types: Marijuana     Allergies   Bactrim [sulfamethoxazole-trimethoprim]   Review of Systems Review of Systems   Physical Exam Triage Vital Signs ED Triage Vitals  Enc Vitals Group     BP 10/02/17 1806 103/68     Pulse Rate 10/02/17 1806 76     Resp 10/02/17 1806 16     Temp 10/02/17 1806 97.8 F (36.6 C)     Temp Source 10/02/17 1806 Temporal     SpO2 10/02/17 1806 100 %     Weight 10/02/17 1806 103 lb (46.7 kg)     Height --      Head Circumference --      Peak Flow --      Pain Score 10/02/17 1805 10     Pain Loc --      Pain Edu? --      Excl. in GC? --    No data found.  Updated Vital Signs BP 103/68   Pulse 76   Temp 97.8 F (36.6 C) (Temporal)   Resp 16   Wt 103 lb (46.7 kg)   LMP  (LMP Unknown) Comment: irregular since stopping depo  SpO2 100%   BMI 20.12 kg/m    Visual Acuity Right Eye Distance:   Left Eye Distance:   Bilateral Distance:    Right Eye Near:   Left Eye Near:    Bilateral Near:     Physical Exam  Constitutional: She is oriented to  person, place, and time. She appears well-developed and well-nourished. No distress.  HENT:  Head: Normocephalic and atraumatic.  Right Ear: Tympanic membrane, external ear and ear canal normal.  Left Ear: Tympanic membrane, external ear and ear canal normal.  Nose: Nose normal.  Mouth/Throat: Uvula is midline, oropharynx is clear and moist and mucous membranes are normal. No tonsillar exudate.  Eyes: Conjunctivae and EOM are normal. Pupils are equal, round, and reactive to light.  Neck: Normal range of motion.  Cardiovascular: Normal rate, regular rhythm and normal heart sounds.  Pulmonary/Chest: Effort normal and breath sounds normal.  Lymphadenopathy:    She has no cervical adenopathy.  Neurological: She is alert and oriented to person, place, and time. No cranial nerve deficit or sensory deficit. She exhibits normal muscle tone. Coordination normal.  Skin: Skin is warm and dry.  Psychiatric:  Very flat affect      UC Treatments / Results  Labs (all labs ordered are listed, but only abnormal results are displayed) Labs Reviewed - No data to display  EKG  EKG Interpretation None       Radiology No results found.  Procedures Procedures (including critical care time)  Medications Ordered in UC Medications  ketorolac (TORADOL) injection 60 mg (60 mg Intramuscular Given 10/02/17 1858)     Initial Impression / Assessment and Plan / UC Course  I have reviewed the triage vital signs and the nursing notes.  Pertinent labs & imaging results that were available during my care of the patient were reviewed by me and considered in my medical decision making (see chart for details).     Without acute findings on exam. Non toxic in appearance. Vitals stable. Without neurological  findings. toradol given in clinic with minimal relief. Continue with supportive cares. Headache vs viral illness discussed. Continue to monitor for new or developing symptoms. Return precautions provided. Patient verbalized understanding and agreeable to plan.  Ambulatory out of clinic without difficulty.    Final Clinical Impressions(s) / UC Diagnoses   Final diagnoses:  Acute nonintractable headache, unspecified headache type  Body aches    ED Discharge Orders    None       Controlled Substance Prescriptions [place] Controlled Substance Registry consulted? Not Applicable   Georgetta HaberBurky, Natalie B, NP 10/02/17 1934

## 2017-10-02 NOTE — Discharge Instructions (Signed)
Increase your water intake. Tylenol as needed for pain.  May take additional ibuprofen in another 8 hours. If symptoms worsen or do not improve in the next week to return to be seen or to follow up with your PCP.

## 2017-10-03 LAB — CYTOLOGY - PAP
Adequacy: ABSENT
Bacterial vaginitis: NEGATIVE
Candida vaginitis: NEGATIVE
Chlamydia: NEGATIVE
Diagnosis: NEGATIVE
Diagnosis: REACTIVE
NEISSERIA GONORRHEA: NEGATIVE
Trichomonas: NEGATIVE

## 2017-10-07 ENCOUNTER — Ambulatory Visit: Payer: Self-pay | Admitting: Physician Assistant

## 2017-10-19 ENCOUNTER — Encounter (HOSPITAL_COMMUNITY): Payer: Self-pay | Admitting: Nurse Practitioner

## 2017-10-19 ENCOUNTER — Emergency Department (HOSPITAL_COMMUNITY)
Admission: EM | Admit: 2017-10-19 | Discharge: 2017-10-20 | Disposition: A | Discharge status: Home / Self Care | Payer: Medicaid Other | Attending: Emergency Medicine | Admitting: Emergency Medicine

## 2017-10-19 ENCOUNTER — Emergency Department (HOSPITAL_COMMUNITY): Payer: Medicaid Other

## 2017-10-19 DIAGNOSIS — F1210 Cannabis abuse, uncomplicated: Secondary | ICD-10-CM | POA: Diagnosis not present

## 2017-10-19 DIAGNOSIS — Z79899 Other long term (current) drug therapy: Secondary | ICD-10-CM | POA: Diagnosis not present

## 2017-10-19 DIAGNOSIS — R112 Nausea with vomiting, unspecified: Secondary | ICD-10-CM | POA: Diagnosis not present

## 2017-10-19 DIAGNOSIS — Z87891 Personal history of nicotine dependence: Secondary | ICD-10-CM | POA: Diagnosis not present

## 2017-10-19 DIAGNOSIS — R197 Diarrhea, unspecified: Secondary | ICD-10-CM | POA: Diagnosis not present

## 2017-10-19 DIAGNOSIS — R1084 Generalized abdominal pain: Secondary | ICD-10-CM | POA: Diagnosis present

## 2017-10-19 LAB — CBC WITH DIFFERENTIAL/PLATELET
BASOS ABS: 0 10*3/uL (ref 0.0–0.1)
BASOS PCT: 0 %
EOS PCT: 1 %
Eosinophils Absolute: 0 10*3/uL (ref 0.0–0.7)
HCT: 37.7 % (ref 36.0–46.0)
Hemoglobin: 12.8 g/dL (ref 12.0–15.0)
Lymphocytes Relative: 19 %
Lymphs Abs: 1.1 10*3/uL (ref 0.7–4.0)
MCH: 30.8 pg (ref 26.0–34.0)
MCHC: 34 g/dL (ref 30.0–36.0)
MCV: 90.6 fL (ref 78.0–100.0)
Monocytes Absolute: 0.4 10*3/uL (ref 0.1–1.0)
Monocytes Relative: 7 %
Neutro Abs: 4.3 10*3/uL (ref 1.7–7.7)
Neutrophils Relative %: 73 %
PLATELETS: 204 10*3/uL (ref 150–400)
RBC: 4.16 MIL/uL (ref 3.87–5.11)
RDW: 12.4 % (ref 11.5–15.5)
WBC: 5.9 10*3/uL (ref 4.0–10.5)

## 2017-10-19 LAB — LIPASE, BLOOD: Lipase: 24 U/L (ref 11–51)

## 2017-10-19 LAB — URINALYSIS, ROUTINE W REFLEX MICROSCOPIC
BILIRUBIN URINE: NEGATIVE
Glucose, UA: NEGATIVE mg/dL
Hgb urine dipstick: NEGATIVE
KETONES UR: 80 mg/dL — AB
Nitrite: NEGATIVE
Protein, ur: NEGATIVE mg/dL
SPECIFIC GRAVITY, URINE: 1.033 — AB (ref 1.005–1.030)
pH: 5 (ref 5.0–8.0)

## 2017-10-19 LAB — COMPREHENSIVE METABOLIC PANEL
ALBUMIN: 4.5 g/dL (ref 3.5–5.0)
ALT: 27 U/L (ref 14–54)
AST: 24 U/L (ref 15–41)
Alkaline Phosphatase: 53 U/L (ref 38–126)
Anion gap: 8 (ref 5–15)
BUN: 14 mg/dL (ref 6–20)
CO2: 25 mmol/L (ref 22–32)
CREATININE: 0.58 mg/dL (ref 0.44–1.00)
Calcium: 9.3 mg/dL (ref 8.9–10.3)
Chloride: 107 mmol/L (ref 101–111)
GFR calc Af Amer: >60 mL/min (ref >60)
GFR calc non Af Amer: >60 mL/min (ref >60)
GLUCOSE: 85 mg/dL (ref 65–99)
POTASSIUM: 3.5 mmol/L (ref 3.5–5.1)
Sodium: 140 mmol/L (ref 135–145)
Total Bilirubin: 1.3 mg/dL — ABNORMAL HIGH (ref 0.3–1.2)
Total Protein: 7.7 g/dL (ref 6.5–8.1)

## 2017-10-19 LAB — I-STAT BETA HCG BLOOD, ED (MC, WL, AP ONLY)

## 2017-10-19 MED ORDER — IOPAMIDOL (ISOVUE-300) INJECTION 61%
INTRAVENOUS | Status: AC
  Filled 2017-10-19: qty 30

## 2017-10-19 MED ORDER — ONDANSETRON HCL 4 MG/2ML IJ SOLN
4.0000 mg | Freq: Once | INTRAMUSCULAR | Status: AC
  Administered 2017-10-19: 4 mg via INTRAVENOUS
  Filled 2017-10-19: qty 2

## 2017-10-19 MED ORDER — SODIUM CHLORIDE 0.9 % IV BOLUS
2000.0000 mL | Freq: Once | INTRAVENOUS | Status: AC
  Administered 2017-10-19: 2000 mL via INTRAVENOUS

## 2017-10-19 MED ORDER — IOPAMIDOL (ISOVUE-300) INJECTION 61%
30.0000 mL | Freq: Once | INTRAVENOUS | Status: AC | PRN
  Administered 2017-10-19: 30 mL via ORAL

## 2017-10-19 MED ORDER — IOPAMIDOL (ISOVUE-300) INJECTION 61%
75.0000 mL | Freq: Once | INTRAVENOUS | Status: AC | PRN
  Administered 2017-10-19: 75 mL via INTRAVENOUS

## 2017-10-19 MED ORDER — IOPAMIDOL (ISOVUE-300) INJECTION 61%
INTRAVENOUS | Status: AC
  Filled 2017-10-19: qty 75

## 2017-10-19 MED ORDER — MORPHINE SULFATE (PF) 4 MG/ML IV SOLN
4.0000 mg | Freq: Once | INTRAVENOUS | Status: AC
  Administered 2017-10-19: 4 mg via INTRAVENOUS
  Filled 2017-10-19: qty 1

## 2017-10-19 NOTE — ED Triage Notes (Signed)
Pt is c/o diffuse abdominal pain, states she was referred by urgent care to r/o appendicitis.

## 2017-10-19 NOTE — ED Notes (Signed)
Pt aware urine sample is needed 

## 2017-10-19 NOTE — ED Provider Notes (Signed)
[facility]-EMERGENCY DEPT Provider Note   CSN: [id number] Arrival date & time: 10/19/17  1622     History   Chief Complaint Chief Complaint  Patient presents with  . Abdominal Pain    HPI [name] CRIS GIBBY is a 22 y.o. female with a history of anemia, seizures, UTI, who presents today for evaluation of abdominal pain.  She reports that Saturday she had some questionable food and then after that she developed nausea vomiting and diarrhea.  She  initially went to urgent care today for this and was referred here for appendicitis rule out.  She reports that she has had multiple episodes of vomiting and diarrhea.  She denies pelvic pain,  No reported vaginal discharge/drainage, no abnormal vaginal bleeding.  She denies dysuria or hematuria.  HPI  Past Medical History:  Diagnosis Date  . Anemia 2013  . Anxiety 2011   no meds during pregnancy  . Bipolar 1 disorder (HCC)   . Chlamydia   . Gall stones   . GERD (gastroesophageal reflux disease)   . 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 Seizure [facility]) 2011   pseudoseizures    Patient Active Problem List   Diagnosis Date Noted  . Pelvic pain 09/12/2017  . Amenorrhea, secondary 09/12/2017  . 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 UTI (urinary tract infection) 08/19/2016  . Suicide attempt by drug ingestion (HCC)   . Contraception 03/10/2014  . Biliary colic 03/10/2014  . BV (bacterial vaginosis) 03/10/2014  . Depression 03/16/2013  . Allergic rhinitis, seasonal 01/26/2013  . Anemia, iron deficiency 12/01/2012    Past Surgical History:  Procedure Laterality Date  . CHOLECYSTECTOMY N/A 09/28/2014   Procedure: LAPAROSCOPIC CHOLECYSTECTOMY;  Surgeon: Axel Filler, MD;  Location: [facility] OR;  Service: General;  Laterality: N/A;  . CHOLECYSTECTOMY    . DIRECT LARYNGOSCOPY N/A 04/24/2014   Procedure: DIRECT LARYNGOSCOPY;  Surgeon: [name], MD;   Location: [facility] OR;  Service: ENT;  Laterality: N/A;  . FOREIGN BODY REMOVAL ESOPHAGEAL N/A 04/24/2014   Procedure: REMOVAL FOREIGN BODY ESOPHAGEAL;  Surgeon: [name], MD;  Location: [facility] OR;  Service: ENT;  Laterality: N/A;  . WISDOM TOOTH EXTRACTION  2013     OB History    Gravida  2   Para  1   Term  1   Preterm  0   AB  1   Living  1     SAB  1   TAB  0   Ectopic  0   Multiple      Live Births  1            Home Medications    Prior to Admission medications   Medication Sig Start Date End Date Taking? Authorizing Provider  ibuprofen (ADVIL,MOTRIN) 600 MG tablet Take 1 tablet (600 mg total) by mouth every 6 (six) hours as needed. Patient taking differently: Take 600 mg by mouth every 6 (six) hours as needed for moderate pain.  09/12/17  Yes [name], CNM  lamoTRIgine (LAMICTAL) 25 MG tablet Take 25 mg by mouth at bedtime.   Yes [provider]  mirtazapine (REMERON) 15 MG tablet Take 15 mg by mouth at bedtime.   Yes [provider]  risperiDONE (RISPERDAL) 2 MG tablet Take 2 mg by mouth at bedtime.   Yes [provider]  traMADol (ULTRAM) 50 MG tablet Take 50 mg by mouth  every 6 (six) hours as needed for moderate pain or severe pain.    Yes [provider]  medroxyPROGESTERone (PROVERA) 10 MG tablet Take 1 tablet (10 mg total) by mouth daily for 10 days. 09/12/17 09/22/17  [name], CNM  norgestimate-ethinyl estradiol (SPRINTEC 28) 0.25-35 MG-MCG tablet Take 1 tablet by mouth daily. Start first Sunday after period stops Patient not taking: Reported on 10/01/2017 09/12/17   [name], CNM  omeprazole (PRILOSEC) 40 MG capsule Take 1 capsule (40 mg total) by mouth daily. Patient not taking: Reported on 09/09/2017 09/09/17   Meryl Dare, MD  ondansetron (ZOFRAN ODT) 4 MG disintegrating tablet Take 1 tablet (4 mg total) by mouth every 8 (eight) hours as needed for nausea or vomiting. 10/20/17   Cristina Gong, PA-C   ondansetron (ZOFRAN) 4 MG tablet Take 1 tablet (4 mg total) by mouth every 6 (six) hours. Patient not taking: Reported on 10/19/2017 04/07/17   Audry Pili, PA-C  phenazopyridine (PYRIDIUM) 200 MG tablet Take 1 tablet (200 mg total) by mouth 3 (three) times daily as needed for pain. Patient not taking: Reported on 09/09/2017 09/05/17   Little, Ambrose Finland, MD    Family History Family History  Problem Relation Age of Onset  . Arthritis Mother   . Bronchitis Mother   . Asthma Mother   . Hearing loss Paternal Grandfather   . Cancer Neg Hx   . Diabetes Neg Hx   . Stomach cancer Neg Hx   . Colon cancer Neg Hx     Social History Social History   Tobacco Use  . Smoking status: Former Games developer  . Smokeless tobacco: Former Engineer, water Use Topics  . Alcohol use: No    Alcohol/week: 3.6 oz    Types: 6 Cans of beer per week  . Drug use: Yes    Types: Marijuana     Allergies   Bactrim [sulfamethoxazole-trimethoprim]   Review of Systems Review of Systems  Constitutional: Negative for chills and fever.  HENT: Negative for congestion.   Respiratory: Negative for shortness of breath.   Gastrointestinal: Positive for abdominal pain, diarrhea, nausea and vomiting.  Genitourinary: Negative for dysuria, frequency, hematuria, urgency, vaginal bleeding, vaginal discharge and vaginal pain.  All other systems reviewed and are negative.    Physical Exam Updated Vital Signs BP 117/69 (BP Location: Left Arm)   Pulse 85   Temp 98.8 F (37.1 C) (Oral)   Resp 17   Ht 5' (1.524 m)   Wt 46.7 kg (103 lb)   LMP  (LMP Unknown) Comment: irregular since stopping depo  SpO2 100%   BMI 20.12 kg/m   Physical Exam  Constitutional: She appears well-developed and well-nourished. No distress.  HENT:  Head: Normocephalic and atraumatic.  Eyes: Conjunctivae are normal. Right eye exhibits no discharge. Left eye exhibits no discharge. No scleral icterus.  Neck: Normal range of motion.    Cardiovascular: Normal rate and regular rhythm.  Pulmonary/Chest: Effort normal. No stridor. No respiratory distress.  Abdominal: Normal appearance and bowel sounds are normal. She exhibits no distension. There is generalized tenderness (Generalized TTP, worse in RLQ) and tenderness in the right lower quadrant.  Genitourinary:  Genitourinary Comments: Patient refused GU exam.   Musculoskeletal: She exhibits no edema or deformity.  Neurological: She is alert. She exhibits normal muscle tone.  Skin: Skin is warm and dry. She is not diaphoretic.  Psychiatric: She has a normal mood and affect. Her behavior is normal.  Nursing note  and vitals reviewed.    ED Treatments / Results  Labs (all labs ordered are listed, but only abnormal results are displayed) Labs Reviewed  COMPREHENSIVE METABOLIC PANEL - Abnormal; Notable for the following components:      Result Value   Total Bilirubin 1.3 (*)    All other components within normal limits  URINALYSIS, ROUTINE W REFLEX MICROSCOPIC - Abnormal; Notable for the following components:   Specific Gravity, Urine 1.033 (*)    Ketones, ur 80 (*)    Leukocytes, UA TRACE (*)    Bacteria, UA RARE (*)    Squamous Epithelial / LPF 6-30 (*)    All other components within normal limits  LIPASE, BLOOD  CBC WITH DIFFERENTIAL/PLATELET  I-STAT BETA HCG BLOOD, ED ([facility], [facility], [facility] ONLY)    EKG None  Radiology Ct Abdomen Pelvis W Contrast  Result Date: 10/20/2017 CLINICAL DATA:  Diffuse abdominal pain.  Suspect appendicitis. EXAM: CT ABDOMEN AND PELVIS WITH CONTRAST TECHNIQUE: Multidetector CT imaging of the abdomen and pelvis was performed using the standard protocol following bolus administration of intravenous contrast. CONTRAST:  30mL ISOVUE-300 IOPAMIDOL (ISOVUE-300) INJECTION 61%, 75mL ISOVUE-300 IOPAMIDOL (ISOVUE-300) INJECTION 61% COMPARISON:  Abdominal ultrasound September 18, 2017 and pelvic ultrasound September 12, 2017 and CT abdomen and pelvis March 27, 2016. FINDINGS: LOWER CHEST: Lung bases are clear. Included heart size is normal. No pericardial effusion. HEPATOBILIARY: Status post cholecystectomy.  Normal liver. PANCREAS: Normal. SPLEEN: Normal. ADRENALS/URINARY TRACT: Kidneys are orthotopic, demonstrating symmetric enhancement. No nephrolithiasis, hydronephrosis or solid renal masses. The unopacified ureters are normal in course and caliber. Urinary bladder is partially distended and unremarkable. Normal adrenal glands. STOMACH/BOWEL: The stomach, small and large bowel are normal in course and caliber without inflammatory changes. Enteric contrast has not yet reached the distal small bowel. Multiple fluid-filled nondistended loops of small bowel versus hydrosalpinx in the pelvis. Normal appendix. VASCULAR/LYMPHATIC: Aortoiliac vessels are normal in course and caliber. No lymphadenopathy by CT size criteria. REPRODUCTIVE: Potential hydrosalpinx. OTHER: No intraperitoneal free fluid or free air. MUSCULOSKELETAL: Nonacute. IMPRESSION: 1. Fluid-filled probable small bowel in the pelvis (possible enteritis). Alternatively, this could reflect hydrosalpinx/PID. Normal appendix. Electronically Signed   By: Awilda Metro M.D.   On: 10/20/2017 00:18    Procedures Procedures (including critical care time)  Medications Ordered in ED Medications  iopamidol (ISOVUE-300) 61 % injection (has no administration in time range)  iopamidol (ISOVUE-300) 61 % injection (has no administration in time range)  sodium chloride 0.9 % bolus 2,000 mL (0 mLs Intravenous Stopped 10/20/17 0004)  ondansetron (ZOFRAN) injection 4 mg (4 mg Intravenous Given 10/19/17 2128)  morphine 4 MG/ML injection 4 mg (4 mg Intravenous Given 10/19/17 2128)  iopamidol (ISOVUE-300) 61 % injection 75 mL (75 mLs Intravenous Contrast Given 10/19/17 2311)  iopamidol (ISOVUE-300) 61 % injection 30 mL (30 mLs Oral Contrast Given 10/19/17 2311)     Initial Impression / Assessment and Plan / ED  Course  I have reviewed the triage vital signs and the nursing notes.  Pertinent labs & imaging results that were available during my care of the patient were reviewed by me and considered in my medical decision making (see chart for details).  Clinical Course as of Oct 20 100  Mon Oct 20, 2017  0037 Patient reevaluated, she was informed of the results of her CT scan.  She continues to deny any urinary symptoms.  She was informed that her CT scan showed that she may possibly have PID and she still  declines pelvic exam, stating that she thinks this was from bad food and that she was recently at her OB/GYN so she is not concerned about PID.  We discussed the consequences of refusing this exam and she states her understanding.   [EH]    Clinical Course User Index [EH] Cristina GongHammond, Delena Casebeer W, PA-C    Patient is nontoxic, nonseptic appearing, in no apparent distress.  Patient's pain and other symptoms adequately managed in emergency department.  Fluid bolus given.  Labs, imaging and vitals reviewed.  Patient does not meet the SIRS or Sepsis criteria.  On repeat exam patient does not have a surgical abdomen and there are no peritoneal signs.  No indication of appendicitis, bowel obstruction, bowel perforation, cholecystitis, diverticulitis.  Patient refused pelvic exam even after being told she may have PID.  She was informed of the consequences of this and declined saying that she thinks it is from bad food.  She refused pelvic exam.  Patient discharged home with symptomatic treatment and given strict instructions for follow-up with their primary care physician.  I have also discussed reasons to return immediately to the ER.  Patient expresses understanding and agrees with plan.   Final Clinical Impressions(s) / ED Diagnoses   Final diagnoses:  Generalized abdominal pain  Nausea vomiting and diarrhea    ED Discharge Orders        Ordered    ondansetron (ZOFRAN ODT) 4 MG disintegrating tablet   Every 8 hours PRN     10/20/17 0035       Cristina GongHammond, Royal Vandevoort W, PA-C 10/20/17 0107    Phillis HaggisMabe, Martha L, MD 10/20/17 2016

## 2017-10-20 MED ORDER — ONDANSETRON 4 MG PO TBDP: 4.0000 mg | ORAL_TABLET | Freq: Three times a day (TID) | ORAL | 0 refills | Status: DC | PRN

## 2017-10-20 NOTE — Discharge Instructions (Addendum)
Please follow-up with your primary care provider and your OB/GYN regarding your ED visit and symptoms today.  I highly recommend that you get a pelvic exam to get checked for gonorrhea chlamydia as this may be contributing to your symptoms today.  If you experience worsening symptoms, worsening pain or any concerns please seek additional medical evaluation.  Please make sure that you are staying well-hydrated.  Today you received medications that may make you sleepy or impair your ability to make decisions.  For the next 24 hours please do not drive, operate heavy machinery, care for a small child with out another adult present, or perform any activities that may cause harm to you or someone else if you were to fall asleep or be impaired.

## 2017-10-22 ENCOUNTER — Ambulatory Visit: Payer: Medicaid Other | Admitting: Physician Assistant

## 2017-10-22 ENCOUNTER — Encounter: Payer: Self-pay | Admitting: Physician Assistant

## 2017-10-22 VITALS — BP 100/60 | HR 64 | Ht 60.0 in | Wt 103.0 lb

## 2017-10-22 DIAGNOSIS — Z9049 Acquired absence of other specified parts of digestive tract: Secondary | ICD-10-CM

## 2017-10-22 DIAGNOSIS — R933 Abnormal findings on diagnostic imaging of other parts of digestive tract: Secondary | ICD-10-CM | POA: Diagnosis not present

## 2017-10-22 DIAGNOSIS — R1084 Generalized abdominal pain: Secondary | ICD-10-CM

## 2017-10-22 DIAGNOSIS — R194 Change in bowel habit: Secondary | ICD-10-CM | POA: Diagnosis not present

## 2017-10-22 DIAGNOSIS — K219 Gastro-esophageal reflux disease without esophagitis: Secondary | ICD-10-CM

## 2017-10-22 DIAGNOSIS — K589 Irritable bowel syndrome without diarrhea: Secondary | ICD-10-CM

## 2017-10-22 NOTE — Progress Notes (Signed)
Reviewed and agree with management plan.  Shaunika Italiano T. Pierson Vantol, MD FACG 

## 2017-10-22 NOTE — Progress Notes (Signed)
Subjective:    Patient ID: Leah [name], female    DOB: [birth year], 22 y.o.   MRN: [id number]  HPI Leah [name] is a 22 year old African-American female, recently established with Dr. Russella Dar comes in today for follow-up.  She has history of bipolar disorder, major depression and is status post cholecystectomy.  She was seen in February 2019 with complaints of epigastric pain GERD, intermittent nausea vomiting and alternating bowel habits.  Her symptoms were felt consistent with GERD and IBS.  She was given a trial of omeprazole 40 mg p.o. daily and glycopyrrolate 1 mg p.o. twice daily. She had an ER visit on 10/19/2017 with complaints of abdominal pain nausea vomiting and diarrhea.  Patient says she felt like she had food poisoning.  CT of the abdomen and pelvis was done, and this showed that the contrast had not reached the distal small bowel at the time the exam was done and there was question of fluid-filled loops of small bowel in the pelvis versus a hydrosalpinx.  Patient was questioned about PID but declined pelvic exam.  She says she had just been to her gynecologist had a Pap smear etc. and an ultrasound all of which were fine. Reviewing her records she did have an ultrasound of the pelvis done 09/12/2017 which was negative. She says she is feeling fine now and still believes that episode was likely food poisoning.  She has not been having any ongoing heartburn or indigestion and stopped taking the omeprazole because she was not sure it helped.  She did not continue on glycopyrrolate either.  She says she really does not feel that she needs any medicine right now.  She has no complaints of abdominal pain and bowel habits have normalized. She also mentions that she is trying to get pregnant.  Review of Systems Pertinent positive and negative review of systems were noted in the above HPI section.  All other review of systems was otherwise negative.  Outpatient Encounter Medications as of 10/22/2017    Medication Sig  . ibuprofen (ADVIL,MOTRIN) 600 MG tablet Take 1 tablet (600 mg total) by mouth every 6 (six) hours as needed. (Patient taking differently: Take 600 mg by mouth every 6 (six) hours as needed for moderate pain. )  . lamoTRIgine (LAMICTAL) 25 MG tablet Take 25 mg by mouth at bedtime.  . mirtazapine (REMERON) 15 MG tablet Take 15 mg by mouth at bedtime.  . ondansetron (ZOFRAN ODT) 4 MG disintegrating tablet Take 1 tablet (4 mg total) by mouth every 8 (eight) hours as needed for nausea or vomiting.  . ondansetron (ZOFRAN) 4 MG tablet Take 1 tablet (4 mg total) by mouth every 6 (six) hours.  . phenazopyridine (PYRIDIUM) 200 MG tablet Take 1 tablet (200 mg total) by mouth 3 (three) times daily as needed for pain.  Marland Kitchen risperiDONE (RISPERDAL) 2 MG tablet Take 2 mg by mouth at bedtime.  . traMADol (ULTRAM) 50 MG tablet Take 50 mg by mouth every 6 (six) hours as needed for moderate pain or severe pain.   . [DISCONTINUED] medroxyPROGESTERone (PROVERA) 10 MG tablet Take 1 tablet (10 mg total) by mouth daily for 10 days.  . [DISCONTINUED] norgestimate-ethinyl estradiol (SPRINTEC 28) 0.25-35 MG-MCG tablet Take 1 tablet by mouth daily. Start first Sunday after period stops (Patient not taking: Reported on 10/01/2017)  . [DISCONTINUED] omeprazole (PRILOSEC) 40 MG capsule Take 1 capsule (40 mg total) by mouth daily. (Patient not taking: Reported on 09/09/2017)   No facility-administered encounter medications  on file as of 10/22/2017.    Allergies  Allergen Reactions  . Bactrim [Sulfamethoxazole-Trimethoprim] Rash   Patient Active Problem List   Diagnosis Date Noted  . S/P laparoscopic cholecystectomy 10/22/2017  . Pelvic pain 09/12/2017  . Amenorrhea, secondary 09/12/2017  . 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 UTI (urinary tract infection) 08/19/2016  . Suicide attempt by drug ingestion (HCC)   . Contraception 03/10/2014  . Biliary colic  03/10/2014  . BV (bacterial vaginosis) 03/10/2014  . Depression 03/16/2013  . Allergic rhinitis, seasonal 01/26/2013  . Anemia, iron deficiency 12/01/2012   Social History   Socioeconomic History  . Marital status: Single    Spouse name: Not on file  . Number of children: 1  . Years of education: 50 th   . Highest education level: Not on file  Occupational History  . Occupation: Conservation officer, nature -[employer]  Social Needs  . Financial resource strain: Not on file  . Food insecurity:    Worry: Not on file    Inability: Not on file  . Transportation needs:    Medical: Not on file    Non-medical: Not on file  Tobacco Use  . Smoking status: Former Games developer  . Smokeless tobacco: Never Used  . Tobacco comment: marjiuna  Substance and Sexual Activity  . Alcohol use: No    Alcohol/week: 3.6 oz    Types: 6 Cans of beer per week  . Drug use: Yes    Types: Marijuana    Comment: occ.  Marland Kitchen Sexual activity: Yes    Partners: Male    Birth control/protection: None  Lifestyle  . Physical activity:    Days per week: Not on file    Minutes per session: Not on file  . Stress: Not on file  Relationships  . Social connections:    Talks on phone: Not on file    Gets together: Not on file    Attends religious service: Not on file    Active member of club or organization: Not on file    Attends meetings of clubs or organizations: Not on file    Relationship status: Not on file  . Intimate partner violence:    Fear of current or ex partner: Not on file    Emotionally abused: Not on file    Physically abused: Not on file    Forced sexual activity: Not on file  Other Topics Concern  . Not on file  Social History Narrative   ** Merged History Encounter **       Live with roommate and son.    Starting at [employer].     Ms. Warwick family history includes Arthritis in her mother; Asthma in her mother; Bronchitis in her mother; Hearing loss in her paternal grandfather.      Objective:      Vitals:   10/22/17 1114  BP: 100/60  Pulse: 64    Physical Exam; well developed young African-American female in no acute distress, pleasant blood pressure 100/60 pulse 64, height 5 foot, weight 103, BMI 20.1.  HEENT ;nontraumatic normocephalic EOMI PERRLA sclera anicteric, Cardiovascular; regular rate and rhythm with S1-S2 no murmur rub or gallop, Pulmonary; clear bilaterally, Abdomen ;soft, nondistended nontender bowel sounds are active there is no palpable mass or hepatosplenomegaly, Rectal; exam not done, Extremities ;no clubbing cyanosis or edema skin warm dry, Neuro psych; mood and affect appropriate       Assessment & Plan:   #10 22 year old  female with probable IBS with intermittent complaints of abdominal pain and alternating bowel habits.  Currently she is asymptomatic. ER visit last week for acute abdominal pain nausea vomiting and diarrhea.  Patient felt that was an acute foodborne illness which resolved within 48 hours.  CT imaging showed fluid-filled loops of small bowel in the pelvis versus a hydrosalpinx.  Contrast had not reached the distal small bowel and I suspect this was fluid-filled loops of bowel in the setting of an acute enteritis.  #2 bipolar disorder with history of major depression  Plan; patient is not requiring any regular medications for her GI symptoms. As she is planning to become pregnant we discussed avoiding any medications that are not absolutely necessary, and paying attention to dietary triggers for episodes of diarrhea. She will follow-up with Dr. Russella DarStark or myself on an as-needed basis.  Leah Luba S Cleave Ternes PA-C 10/22/2017   Cc: No ref. provider found

## 2017-10-22 NOTE — Patient Instructions (Addendum)
If you are age 23 or older, your body mass index should be between 23-30. Your Body mass index is 20.12 kg/m. If this is out of the aforementioned range listed, please consider follow up with your Primary Care Provider.  If you are age 23 or younger, your body mass index should be between 19-25. Your Body mass index is 20.12 kg/m. If this is out of the aformentioned range listed, please consider follow up with your Primary Care Provider.   Follow up with Dr Russella DarStark as needed.  I appreciate the opportunity to care for you. Amy Esterwood, PA-C

## 2017-11-05 ENCOUNTER — Inpatient Hospital Stay (HOSPITAL_COMMUNITY)
Admission: AD | Admit: 2017-11-05 | Discharge: 2017-11-05 | Disposition: A | Discharge status: Home / Self Care | Payer: Medicaid Other | Source: Ambulatory Visit | Attending: Obstetrics and Gynecology | Admitting: Obstetrics and Gynecology

## 2017-11-05 ENCOUNTER — Other Ambulatory Visit: Payer: Self-pay

## 2017-11-05 ENCOUNTER — Encounter (HOSPITAL_COMMUNITY): Payer: Self-pay | Admitting: *Deleted

## 2017-11-05 DIAGNOSIS — N912 Amenorrhea, unspecified: Secondary | ICD-10-CM | POA: Diagnosis not present

## 2017-11-05 DIAGNOSIS — Z3200 Encounter for pregnancy test, result unknown: Secondary | ICD-10-CM

## 2017-11-05 NOTE — MAU Note (Signed)
Has been nauseous, threw up.  +HPT on 4/5, repeated had neg on 4/14.  Her boobs have been really sore and her nipples are tender, started when she  Had the +test.   No period in 4 yrs, iud then depo, last depo July 2018, trying to get preg.

## 2017-11-05 NOTE — MAU Provider Note (Signed)
Leah [name] is a 22 y.o. G2P1011 who presents with possible pregnancy. She  has been amenorrheic for years while on contraception and had last period July 2018. She was  treated 2 months ago with 10 day course progesterone but did not have a bleed after taking as directed. She never started the OCPs because she wants to get pregnant. She has subjective symptoms of pregnancy ( breast tenderness, nausea).Wants breasts checked. HPT positive 10/23/2017, negative on 11/02/2017.  Denies spotting or abdominal pain.   OBJECTIVE:   Pulse 79   Temp 99.5 F (37.5 C) (Oral)   Resp 16   Wt 105 lb 4 oz (47.7 kg)   BMI 20.56 kg/m   General: NAD. Breasts symmmetric, no masses noted. Minimally TTP.  not inflamed, no nipple discharge.  ASSESSMENT:  Possible pregnancy, not yet confirmed  PLAN: Referred to [facility]. Pt to call for appointment.  Danae Orleansoe, Chayil Gantt C, CNM 11/05/2017 6:51 PM

## 2017-11-06 ENCOUNTER — Other Ambulatory Visit: Payer: Medicaid Other

## 2017-11-06 DIAGNOSIS — Z3200 Encounter for pregnancy test, result unknown: Secondary | ICD-10-CM

## 2017-11-06 NOTE — Progress Notes (Unsigned)
Pt seen @ MAU yesterday. She reported sx of pregnancy during the visit. Referred here today by Caren Griffinseirdre Poe, CNM for serum pregnancy test.

## 2017-11-07 LAB — BETA HCG QUANT (REF LAB): hCG Quant: <1 m[IU]/mL

## 2017-12-16 ENCOUNTER — Encounter (HOSPITAL_COMMUNITY): Payer: Self-pay

## 2017-12-16 ENCOUNTER — Emergency Department (HOSPITAL_COMMUNITY): Payer: Medicaid Other

## 2017-12-16 ENCOUNTER — Emergency Department (HOSPITAL_COMMUNITY)
Admission: EM | Admit: 2017-12-16 | Discharge: 2017-12-16 | Disposition: A | Discharge status: Home / Self Care | Payer: Medicaid Other | Attending: Emergency Medicine | Admitting: Emergency Medicine

## 2017-12-16 DIAGNOSIS — K5900 Constipation, unspecified: Secondary | ICD-10-CM | POA: Diagnosis not present

## 2017-12-16 DIAGNOSIS — R109 Unspecified abdominal pain: Secondary | ICD-10-CM | POA: Diagnosis present

## 2017-12-16 DIAGNOSIS — Z87891 Personal history of nicotine dependence: Secondary | ICD-10-CM | POA: Insufficient documentation

## 2017-12-16 DIAGNOSIS — Z79899 Other long term (current) drug therapy: Secondary | ICD-10-CM | POA: Diagnosis not present

## 2017-12-16 LAB — I-STAT BETA HCG BLOOD, ED (MC, WL, AP ONLY)

## 2017-12-16 LAB — COMPREHENSIVE METABOLIC PANEL
ALBUMIN: 4 g/dL (ref 3.5–5.0)
ALK PHOS: 48 U/L (ref 38–126)
ALT: 13 U/L — AB (ref 14–54)
AST: 18 U/L (ref 15–41)
Anion gap: 9 (ref 5–15)
BILIRUBIN TOTAL: 0.7 mg/dL (ref 0.3–1.2)
BUN: 15 mg/dL (ref 6–20)
CALCIUM: 9 mg/dL (ref 8.9–10.3)
CO2: 23 mmol/L (ref 22–32)
CREATININE: 0.58 mg/dL (ref 0.44–1.00)
Chloride: 108 mmol/L (ref 101–111)
GFR calc non Af Amer: >60 mL/min (ref >60)
GLUCOSE: 81 mg/dL (ref 65–99)
Potassium: 3.5 mmol/L (ref 3.5–5.1)
Sodium: 140 mmol/L (ref 135–145)
TOTAL PROTEIN: 7.1 g/dL (ref 6.5–8.1)

## 2017-12-16 LAB — CBC
HCT: 34.7 % — ABNORMAL LOW (ref 36.0–46.0)
Hemoglobin: 11.6 g/dL — ABNORMAL LOW (ref 12.0–15.0)
MCH: 30.5 pg (ref 26.0–34.0)
MCHC: 33.4 g/dL (ref 30.0–36.0)
MCV: 91.3 fL (ref 78.0–100.0)
PLATELETS: 188 10*3/uL (ref 150–400)
RBC: 3.8 MIL/uL — ABNORMAL LOW (ref 3.87–5.11)
RDW: 12.8 % (ref 11.5–15.5)
WBC: 6.3 10*3/uL (ref 4.0–10.5)

## 2017-12-16 LAB — LIPASE, BLOOD: Lipase: 23 U/L (ref 11–51)

## 2017-12-16 MED ORDER — PANTOPRAZOLE SODIUM 20 MG PO TBEC: 20.0000 mg | DELAYED_RELEASE_TABLET | Freq: Every day | ORAL | 0 refills | Status: DC

## 2017-12-16 MED ORDER — DOCUSATE SODIUM 100 MG PO CAPS: 100.0000 mg | ORAL_CAPSULE | Freq: Two times a day (BID) | ORAL | 0 refills | Status: DC

## 2017-12-16 MED ORDER — MAGNESIUM CITRATE PO SOLN
1.0000 | Freq: Once | ORAL | Status: AC
  Administered 2017-12-16: 1 via ORAL
  Filled 2017-12-16: qty 296

## 2017-12-16 NOTE — Discharge Instructions (Addendum)
Follow-up with GI provider if symptoms continue.

## 2017-12-16 NOTE — ED Provider Notes (Signed)
[facility]-EMERGENCY DEPT Provider Note  CSN: [id number] Arrival date & time: 12/16/17  6578  History   Chief Complaint Chief Complaint  Patient presents with  . Abdominal Pain    HPI Leah [name] is a 22 y.o. female with a medical history of IBS and GERD who presented to the ED for abdominal pain. She states that she chronically has abdominal pain and has had GI issues since childhood. However, she presented to the ED because her abdominal pain has worsened since 12/05/17. Describes it as tightness in her epigastric area that is worse at night and after eating. Patient states she eats mostly fast foods and has not had any issues with her appetite and eats normal amounts. Denies dysphagia, N/V/D, but occasionally has heartburn.   Associated symptoms: bloating, constipation and has not had a BM in 4 days. Denies hematemesis, hematochezia/melena or urinary symptoms. She states her menstrual periods have been normal. Denies fever, chills, diaphoresis, chest pain, palpitations, SOB. Patient has tried Aleve, Midol and Tylenol prior to coming to the ED which has not helped. Endorses drinking alcohol last night.  Additional history obtained by medical chart. Cholecystectomy in 2016. Patient last seen by GI on 10/22/17 when she was having intermittent GERD and abdominal pain. No medications prescribed at that time.  Past Medical History:  Diagnosis Date  . Anemia 2013  . Anxiety 2011   no meds during pregnancy  . Bipolar 1 disorder (HCC)   . Chlamydia   . Gall stones   . GERD (gastroesophageal reflux disease)   . 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 Seizure [facility]) 2011   pseudoseizures    Patient Active Problem List   Diagnosis Date Noted  . S/P laparoscopic cholecystectomy 10/22/2017  . Pelvic pain 09/12/2017  . Amenorrhea, secondary 09/12/2017  . 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 UTI  (urinary tract infection) 08/19/2016  . Suicide attempt by drug ingestion (HCC)   . Contraception 03/10/2014  . Biliary colic 03/10/2014  . BV (bacterial vaginosis) 03/10/2014  . Depression 03/16/2013  . Allergic rhinitis, seasonal 01/26/2013  . Anemia, iron deficiency 12/01/2012    Past Surgical History:  Procedure Laterality Date  . CHOLECYSTECTOMY N/A 09/28/2014   Procedure: LAPAROSCOPIC CHOLECYSTECTOMY;  Surgeon: Axel Filler, MD;  Location: [facility] OR;  Service: General;  Laterality: N/A;  . CHOLECYSTECTOMY    . DIRECT LARYNGOSCOPY N/A 04/24/2014   Procedure: DIRECT LARYNGOSCOPY;  Surgeon: [name], MD;  Location: [facility] OR;  Service: ENT;  Laterality: N/A;  . FOREIGN BODY REMOVAL ESOPHAGEAL N/A 04/24/2014   Procedure: REMOVAL FOREIGN BODY ESOPHAGEAL;  Surgeon: [name], MD;  Location: [facility] OR;  Service: ENT;  Laterality: N/A;  . WISDOM TOOTH EXTRACTION  2013     OB History    Gravida  2   Para  1   Term  1   Preterm  0   AB  1   Living  1     SAB  1   TAB  0   Ectopic  0   Multiple      Live Births  1            Home Medications    Prior to Admission medications   Medication Sig Start Date End Date Taking? Authorizing Provider  acetaminophen (TYLENOL) 500 MG tablet Take 1,000 mg by mouth every 6 (six) hours as needed for moderate pain.  Yes [provider]  ibuprofen (ADVIL,MOTRIN) 200 MG tablet Take 600 mg by mouth every 6 (six) hours as needed for moderate pain.   Yes [provider]  lamoTRIgine (LAMICTAL) 25 MG tablet Take 25 mg by mouth at bedtime.   Yes [provider]  mirtazapine (REMERON) 15 MG tablet Take 15 mg by mouth at bedtime.   Yes [provider]  risperiDONE (RISPERDAL) 2 MG tablet Take 2 mg by mouth at bedtime.   Yes [provider]  docusate sodium (COLACE) 100 MG capsule Take 1 capsule (100 mg total) by mouth every 12 (twelve) hours. 12/16/17 01/15/18  Mortis, Jerrel Ivory I, PA-C  ondansetron  (ZOFRAN ODT) 4 MG disintegrating tablet Take 1 tablet (4 mg total) by mouth every 8 (eight) hours as needed for nausea or vomiting. Patient not taking: Reported on 12/16/2017 10/20/17   Cristina Gong, PA-C  pantoprazole (PROTONIX) 20 MG tablet Take 1 tablet (20 mg total) by mouth daily. 12/16/17 01/15/18  Mortis, Jerrel Ivory I, PA-C  phenazopyridine (PYRIDIUM) 200 MG tablet Take 1 tablet (200 mg total) by mouth 3 (three) times daily as needed for pain. Patient not taking: Reported on 12/16/2017 09/05/17   Little, Ambrose Finland, MD    Family History Family History  Problem Relation Age of Onset  . Arthritis Mother   . Bronchitis Mother   . Asthma Mother   . Hearing loss Paternal Grandfather   . Diabetes Neg Hx   . Stomach cancer Neg Hx   . Colon cancer Neg Hx     Social History Social History   Tobacco Use  . Smoking status: Former Games developer  . Smokeless tobacco: Never Used  . Tobacco comment: marjiuna  Substance Use Topics  . Alcohol use: No    Alcohol/week: 3.6 oz    Types: 6 Cans of beer per week  . Drug use: Yes    Types: Marijuana    Comment: occ.     Allergies   Bactrim [sulfamethoxazole-trimethoprim]   Review of Systems Review of Systems  Constitutional: Negative.  Negative for chills, diaphoresis and fever.  Respiratory: Negative.  Negative for shortness of breath.   Cardiovascular: Negative.  Negative for chest pain and palpitations.  Gastrointestinal: Positive for abdominal pain and constipation. Negative for blood in stool, diarrhea, nausea and vomiting.  Endocrine: Negative.   Genitourinary: Negative.   Skin: Negative.   Neurological: Negative.      Physical Exam Updated Vital Signs BP 110/67 (BP Location: Right Arm)   Pulse 79   Temp 97.8 F (36.6 C) (Oral)   Resp 18   SpO2 99%   Physical Exam  Constitutional: She appears well-developed and well-nourished. She is cooperative. She does not appear ill. No distress.  HENT:  Mouth/Throat: Uvula is  midline, oropharynx is clear and moist and mucous membranes are normal.  Cardiovascular: Normal rate, regular rhythm and normal heart sounds.  Pulmonary/Chest: Effort normal and breath sounds normal.  Abdominal: Soft. Normal appearance and bowel sounds are normal. She exhibits no abdominal bruit. There is no tenderness. There is no rigidity and no guarding.  Tympanic to percussion in upper quadrants and epigastric region. Dullness to percussion in LLQ.  Neurological: She is alert.  Skin: Skin is warm and dry.  Nursing note and vitals reviewed.    ED Treatments / Results  Labs (all labs ordered are listed, but only abnormal results are displayed) Labs Reviewed  COMPREHENSIVE METABOLIC PANEL - Abnormal; Notable for the following components:  Result Value   ALT 13 (*)    All other components within normal limits  CBC - Abnormal; Notable for the following components:   RBC 3.80 (*)    Hemoglobin 11.6 (*)    HCT 34.7 (*)    All other components within normal limits  LIPASE, BLOOD  I-STAT BETA HCG BLOOD, ED ([facility], [facility], [facility] ONLY)    EKG None  Radiology Dg Abdomen Acute W/chest  Result Date: 12/16/2017 CLINICAL DATA:  Ten days of mid abdominal pain and constipation. EXAM: DG ABDOMEN ACUTE W/ 1V CHEST COMPARISON:  Abdominal and pelvic CT scan of October 19, 2017 FINDINGS: The lungs are well-expanded and clear. The heart and mediastinal structures are normal. Within the abdomen the colonic stool burden is mild to moderate. No small or large bowel obstructive pattern is observed. There is no free extraluminal gas. There is a punctate calcification projecting over the left L1 transverse process which may lie within the left kidney. The bony structures exhibit no acute abnormalities. IMPRESSION: Mildly increased colonic stool burden is consistent with constipation. No evidence of ileus or obstruction. No acute intra-abdominal abnormality is observed. There is no active cardiopulmonary disease.  Electronically Signed   By: David  [place] M.D.   On: 12/16/2017 09:11    Procedures Procedures (including critical care time)  Medications Ordered in ED Medications  magnesium citrate solution 1 Bottle (has no administration in time range)     Initial Impression / Assessment and Plan / ED Course  Triage vital signs and the nursing notes have been reviewed.  Pertinent labs & imaging results that were available during care of the patient were reviewed and considered in medical decision making (see chart for details). Clinical Course as of Dec 17 942  Tue Dec 16, 2017  0935 Abdominal x-ray did not show any evidence of obstruction or ileus. Findings consistent with constipation.   [GM]    Clinical Course User Index [GM] Mortis, Sharyon Medicus, PA-C   Patient presents in no acute distress. Vital signs were normal. Labs were reassuring. No evidence of acute bleed that requires further evaluation or management. No acute abdominal tenderness or systemic s/s that would suggestion infection, perforation or obstruction. X-ray and physical exam consistent with constipation. Magnesium citrate administered in ED for acute evacuation of stool. Rx Colace prescribed as stool softener. Also prescribed Protonix as patient's history is consistent with PUD and GERD.  Final Clinical Impressions(s) / ED Diagnoses  1. Constipation. Education provided on OTC and diet adjustments that can be made.   Dispo: Home. After thorough clinical evaluation, this patient is determined to be medically stable and can be safely discharged with the previously mentioned treatment and/or outpatient follow-up/referral(s). At this time, there are no other apparent medical conditions that require further screening, evaluation or treatment.   Final diagnoses:  Constipation, unspecified constipation type    ED Discharge Orders        Ordered    pantoprazole (PROTONIX) 20 MG tablet  Daily     12/16/17 0935    docusate sodium  (COLACE) 100 MG capsule  Every 12 hours     12/16/17 0935       Mortis, Amanda I, PA-C 12/16/17 0945    Mancel Bale, MD 12/16/17 [phone]

## 2017-12-16 NOTE — ED Triage Notes (Addendum)
Patient c/o of upper/mid abdominal pain since the 17th. Patient has not had a bowel movement in 4 days but states that is her normal. Patient describes her pain as,"feels like when you drink on an empty stomach". Patient states she did drink yesterday. Patient states she had been to a gastroentology months ago and they couldn't find anything wrong after scans except IBS and gastric reflux.

## 2018-01-12 ENCOUNTER — Emergency Department (HOSPITAL_COMMUNITY)
Admission: EM | Admit: 2018-01-12 | Discharge: 2018-01-12 | Disposition: A | Discharge status: Home / Self Care | Payer: Medicaid Other | Attending: Emergency Medicine | Admitting: Emergency Medicine

## 2018-01-12 ENCOUNTER — Encounter (HOSPITAL_COMMUNITY): Payer: Self-pay | Admitting: Emergency Medicine

## 2018-01-12 ENCOUNTER — Ambulatory Visit: Payer: Self-pay | Admitting: Physician Assistant

## 2018-01-12 DIAGNOSIS — R350 Frequency of micturition: Secondary | ICD-10-CM | POA: Diagnosis present

## 2018-01-12 DIAGNOSIS — R300 Dysuria: Secondary | ICD-10-CM | POA: Diagnosis not present

## 2018-01-12 LAB — WET PREP, GENITAL
CLUE CELLS WET PREP: NONE SEEN
Sperm: NONE SEEN
Trich, Wet Prep: NONE SEEN
YEAST WET PREP: NONE SEEN

## 2018-01-12 LAB — URINALYSIS, ROUTINE W REFLEX MICROSCOPIC
BILIRUBIN URINE: NEGATIVE
Glucose, UA: NEGATIVE mg/dL
HGB URINE DIPSTICK: NEGATIVE
Ketones, ur: NEGATIVE mg/dL
Leukocytes, UA: NEGATIVE
Nitrite: NEGATIVE
PROTEIN: NEGATIVE mg/dL
SPECIFIC GRAVITY, URINE: 1.026 (ref 1.005–1.030)
pH: 6 (ref 5.0–8.0)

## 2018-01-12 LAB — PREGNANCY, URINE: PREG TEST UR: NEGATIVE

## 2018-01-12 MED ORDER — DOXYCYCLINE HYCLATE 100 MG PO CAPS: 100.0000 mg | ORAL_CAPSULE | Freq: Two times a day (BID) | ORAL | 0 refills | Status: DC

## 2018-01-12 NOTE — ED Triage Notes (Signed)
Pt reports that she had her period 23 weeks ago then about 5 days ago would have bleeding when she wiped after urinating for 3 days. Has since stopped, pt reports now she has bladder pressure and urinary frequency.

## 2018-01-12 NOTE — ED Provider Notes (Signed)
[facility]-EMERGENCY DEPT Provider Note   CSN: [id number] Arrival date & time: 01/12/18  [id number]     History   Chief Complaint Chief Complaint  Patient presents with  . Abdominal Pain  . Urinary Frequency    HPI Leah [name] is a 22 y.o. female.  Patient complains of urinary frequency and irregular menses.  She was told when she called the women's clinic that she needs to get a pelvic exam and an urgent care so she came here  The history is provided by the patient. No language interpreter was used.  Abdominal Pain   This is a new problem. The current episode started more than 2 days ago. The problem occurs rarely. The problem has been resolved. The pain is associated with an unknown factor. The pain is located in the suprapubic region. The quality of the pain is aching. The pain is at a severity of 3/10. The pain is mild. Associated symptoms include frequency. Pertinent negatives include diarrhea, flatus, hematuria and headaches. Nothing aggravates the symptoms. Nothing relieves the symptoms. Past workup does not include GI consult.  Urinary Frequency  Associated symptoms include abdominal pain. Pertinent negatives include no chest pain and no headaches.    Past Medical History:  Diagnosis Date  . Anemia 2013  . Anxiety 2011   no meds during pregnancy  . Bipolar 1 disorder (HCC)   . Chlamydia   . Gall stones   . GERD (gastroesophageal reflux disease)   . 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 Seizure [facility](HCC) 2011   pseudoseizures    Patient Active Problem List   Diagnosis Date Noted  . S/P laparoscopic cholecystectomy 10/22/2017  . Pelvic pain 09/12/2017  . Amenorrhea, secondary 09/12/2017  . 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 UTI (urinary tract infection) 08/19/2016  . Suicide attempt by drug ingestion (HCC)   . Contraception 03/10/2014  . Biliary colic 03/10/2014  . BV  (bacterial vaginosis) 03/10/2014  . Depression 03/16/2013  . Allergic rhinitis, seasonal 01/26/2013  . Anemia, iron deficiency 12/01/2012    Past Surgical History:  Procedure Laterality Date  . CHOLECYSTECTOMY N/A 09/28/2014   Procedure: LAPAROSCOPIC CHOLECYSTECTOMY;  Surgeon: Axel FillerArmando Ramirez, MD;  Location: [facility] OR;  Service: General;  Laterality: N/A;  . CHOLECYSTECTOMY    . DIRECT LARYNGOSCOPY N/A 04/24/2014   Procedure: DIRECT LARYNGOSCOPY;  Surgeon: Darletta MollSui W Teoh, MD;  [facility];  Service: ENT;  Laterality: N/A;  . FOREIGN BODY REMOVAL ESOPHAGEAL N/A 04/24/2014   Procedure: REMOVAL FOREIGN BODY ESOPHAGEAL;  Surgeon: Darletta MollSui W Teoh, MD;  Location: [facility] OR;  Service: ENT;  Laterality: N/A;  . WISDOM TOOTH EXTRACTION  2013     OB History    Gravida  2   Para  1   Term  1   Preterm  0   AB  1   Living  1     SAB  1   TAB  0   Ectopic  0   Multiple      Live Births  1            Home Medications    Prior to Admission medications   Medication Sig Start Date End Date Taking? Authorizing Provider  lamoTRIgine (LAMICTAL) 25 MG tablet Take 25 mg by mouth at bedtime.   Yes [provider]  metroNIDAZOLE (METROGEL) 0.75 % vaginal gel Place 1 application vaginally 2 (two) times  daily as needed (For infection.).  12/29/17  Yes [provider]  mirtazapine (REMERON) 15 MG tablet Take 15 mg by mouth at bedtime.   Yes [provider]  phenazopyridine (PYRIDIUM) 200 MG tablet Take 1 tablet (200 mg total) by mouth 3 (three) times daily as needed for pain. 09/05/17  Yes Little, Ambrose Finland, MD  risperiDONE (RISPERDAL) 2 MG tablet Take 2 mg by mouth at bedtime.   Yes [provider]  doxycycline (VIBRAMYCIN) 100 MG capsule Take 1 capsule (100 mg total) by mouth 2 (two) times daily. One po bid x 7 days 01/12/18   Bethann Berkshire, MD    Family History Family History  Problem Relation Age of Onset  . Arthritis Mother   . Bronchitis Mother   .  Asthma Mother   . Hearing loss Paternal Grandfather   . Diabetes Neg Hx   . Stomach cancer Neg Hx   . Colon cancer Neg Hx     Social History Social History   Tobacco Use  . Smoking status: Light Tobacco Smoker  . Smokeless tobacco: Never Used  . Tobacco comment: marjiuna  Substance Use Topics  . Alcohol use: No    Alcohol/week: 3.6 oz    Types: 6 Cans of beer per week  . Drug use: Yes    Types: Marijuana    Comment: occ.     Allergies   Bactrim [sulfamethoxazole-trimethoprim]   Review of Systems Review of Systems  Constitutional: Negative for appetite change and fatigue.  HENT: Negative for congestion, ear discharge and sinus pressure.   Eyes: Negative for discharge.  Respiratory: Negative for cough.   Cardiovascular: Negative for chest pain.  Gastrointestinal: Positive for abdominal pain. Negative for diarrhea and flatus.  Genitourinary: Positive for frequency. Negative for hematuria.  Musculoskeletal: Negative for back pain.  Skin: Negative for rash.  Neurological: Negative for seizures and headaches.  Psychiatric/Behavioral: Negative for hallucinations.     Physical Exam Updated Vital Signs BP 120/72 (BP Location: Left Arm)   Pulse 67   Temp 98.2 F (36.8 C) (Oral)   Resp 18   LMP 12/22/2017   SpO2 100%   Physical Exam  Constitutional: She is oriented to person, place, and time. She appears well-developed.  HENT:  Head: Normocephalic.  Eyes: Conjunctivae and EOM are normal. No scleral icterus.  Neck: Neck supple. No thyromegaly present.  Cardiovascular: Normal rate and regular rhythm. Exam reveals no gallop and no friction rub.  No murmur heard. Pulmonary/Chest: No stridor. She has no wheezes. She has no rales. She exhibits no tenderness.  Abdominal: She exhibits no distension. There is no tenderness. There is no rebound.  Genitourinary:  Genitourinary Comments: Cervix nontender adnexal not enlarged or tender.  Small amount of blood in vault    Musculoskeletal: Normal range of motion. She exhibits no edema.  Lymphadenopathy:    She has no cervical adenopathy.  Neurological: She is oriented to person, place, and time. She exhibits normal muscle tone. Coordination normal.  Skin: No rash noted. No erythema.  Psychiatric: She has a normal mood and affect. Her behavior is normal.     ED Treatments / Results  Labs (all labs ordered are listed, but only abnormal results are displayed) Labs Reviewed  WET PREP, GENITAL - Abnormal; Notable for the following components:      Result Value   WBC, Wet Prep HPF POC MANY (*)    All other components within normal limits  URINALYSIS, ROUTINE W REFLEX MICROSCOPIC  PREGNANCY,  URINE  GC/CHLAMYDIA PROBE AMP ([place]) NOT AT [facility]    EKG None  Radiology No results found.  Procedures Procedures (including critical care time)  Medications Ordered in ED Medications - No data to display   Initial Impression / Assessment and Plan / ED Course  I have reviewed the triage vital signs and the nursing notes.  Pertinent labs & imaging results that were available during my care of the patient were reviewed by me and considered in my medical decision making (see chart for details).     Wet prep urinalysis pregnancy test unremarkable.  Patient's with symptoms of possible urethritis.  She will be placed on doxycycline and will follow-up with the women's clinic  Final Clinical Impressions(s) / ED Diagnoses   Final diagnoses:  Dysuria    ED Discharge Orders        Ordered    doxycycline (VIBRAMYCIN) 100 MG capsule  2 times daily     01/12/18 1441       Bethann Berkshire, MD 01/12/18 1445

## 2018-01-12 NOTE — Discharge Instructions (Addendum)
[facility]

## 2018-01-13 LAB — GC/CHLAMYDIA PROBE AMP (~~LOC~~) NOT AT ARMC
CHLAMYDIA, DNA PROBE: NEGATIVE
Neisseria Gonorrhea: NEGATIVE

## 2018-02-06 ENCOUNTER — Encounter: Payer: Self-pay | Admitting: Obstetrics & Gynecology

## 2018-02-06 ENCOUNTER — Ambulatory Visit (INDEPENDENT_AMBULATORY_CARE_PROVIDER_SITE_OTHER): Payer: Medicaid Other | Admitting: Obstetrics & Gynecology

## 2018-02-06 VITALS — BP 112/66 | HR 65 | Wt 102.3 lb

## 2018-02-06 DIAGNOSIS — Z30017 Encounter for initial prescription of implantable subdermal contraceptive: Secondary | ICD-10-CM

## 2018-02-06 DIAGNOSIS — Z3046 Encounter for surveillance of implantable subdermal contraceptive: Secondary | ICD-10-CM

## 2018-02-06 LAB — POCT PREGNANCY, URINE: PREG TEST UR: NEGATIVE

## 2018-02-06 MED ORDER — ETONOGESTREL 68 MG ~~LOC~~ IMPL
68.0000 mg | DRUG_IMPLANT | Freq: Once | SUBCUTANEOUS | Status: AC
  Administered 2018-02-06: 68 mg via SUBCUTANEOUS

## 2018-02-06 NOTE — Progress Notes (Signed)
   Subjective:    Patient ID: Leah [name], female    DOB: 12-10-1995, 22 y.o.   MRN: [id number]  HPI  22 yo single 85P1 (415 yo son) here for Nexplanon insertion. She has changed her mind about wanting to conceive. She wants to wait a few years. She is aware that irregular bleeding is common with Nexplanon and not a reason to remove it.  She reports that her last occasion of sex was 2 weeks ago. Review of Systems     Objective:   Physical Exam Breathing, conversing, and ambulating normally Well nourished, well hydrated Black female, no apparent distress UPT was negative. Consent was signed. Time out procedure was done. Her left arm was prepped with betadine and infiltrated with 3 cc of 1% lidocaine. After adequate anesthesia was assured, the Nexplanon device was placed according to standard of care. Her arm was hemostatic and was bandaged. She tolerated the procedure well.      Assessment & Plan:  Contraception- Nexplanon Rec back up method for 2 weeks

## 2018-02-06 NOTE — Addendum Note (Signed)
Addended by: Faythe CasaBELLAMY, Clothilde Tippetts M on: 02/06/2018 09:11 AM   Modules accepted: Orders

## 2018-07-16 ENCOUNTER — Ambulatory Visit: Payer: Self-pay | Admitting: Student

## 2018-08-10 ENCOUNTER — Encounter: Payer: Self-pay | Admitting: Student

## 2018-08-10 ENCOUNTER — Telehealth: Payer: Self-pay

## 2018-08-10 ENCOUNTER — Ambulatory Visit (INDEPENDENT_AMBULATORY_CARE_PROVIDER_SITE_OTHER): Payer: Medicaid Other | Admitting: Student

## 2018-08-10 VITALS — BP 108/65 | HR 67 | Ht 60.0 in | Wt 98.6 lb

## 2018-08-10 DIAGNOSIS — Z30015 Encounter for initial prescription of vaginal ring hormonal contraceptive: Secondary | ICD-10-CM

## 2018-08-10 DIAGNOSIS — Z3046 Encounter for surveillance of implantable subdermal contraceptive: Secondary | ICD-10-CM

## 2018-08-10 MED ORDER — ETONOGESTREL-ETHINYL ESTRADIOL 0.12-0.015 MG/24HR VA RING: VAGINAL_RING | VAGINAL | 12 refills | Status: DC

## 2018-08-10 MED ORDER — NORELGESTROMIN-ETH ESTRADIOL 150-35 MCG/24HR TD PTWK: 1.0000 | MEDICATED_PATCH | TRANSDERMAL | 12 refills | Status: DC

## 2018-08-10 NOTE — Patient Instructions (Signed)

## 2018-08-10 NOTE — Telephone Encounter (Signed)
Pt called and informed me that her insurance does not cover the Nuvaring.  I contacted pt's pharmacy and was informed the same. However, the pharamacy informed me that her insurance would cover the patch.  Pt agreed to the patch.  Notified Judeth Horn, NP who permitted pt to receive the patch.   Order e-prescribed and pt notified.

## 2018-08-10 NOTE — Progress Notes (Signed)
History:  [name] is a 23 y.o. G2P1011 who presents to clinic today for nexplanon removal. Had nexplanon placed 01/2018. Reports left arm pain for the last 2 months that she states is related to the nexplanon. Also would like to start having scheduled cycles that she can achieve while on other forms of birth control. Is unsure which option she would like but has used depo, OCPs, & IUD in the past & would not like any of those.     Patient Active Problem List   Diagnosis Date Noted  . S/P laparoscopic cholecystectomy 10/22/2017  . Pelvic pain 09/12/2017  . Amenorrhea, secondary 09/12/2017  . 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 UTI (urinary tract infection) 08/19/2016  . Suicide attempt by drug ingestion (HCC)   . Contraception 03/10/2014  . Biliary colic 03/10/2014  . BV (bacterial vaginosis) 03/10/2014  . Depression 03/16/2013  . Allergic rhinitis, seasonal 01/26/2013  . Anemia, iron deficiency 12/01/2012    Allergies  Allergen Reactions  . Bactrim [Sulfamethoxazole-Trimethoprim] Rash    Current Outpatient Medications on File Prior to Visit  Medication Sig Dispense Refill  . lamoTRIgine (LAMICTAL) 25 MG tablet Take 25 mg by mouth at bedtime.    . mirtazapine (REMERON) 15 MG tablet Take 15 mg by mouth at bedtime.    . risperiDONE (RISPERDAL) 2 MG tablet Take 2 mg by mouth at bedtime.    Marland Kitchen doxycycline (VIBRAMYCIN) 100 MG capsule Take 1 capsule (100 mg total) by mouth 2 (two) times daily. One po bid x 7 days 14 capsule 0  . metroNIDAZOLE (METROGEL) 0.75 % vaginal gel Place 1 application vaginally 2 (two) times daily as needed (For infection.).   0  . phenazopyridine (PYRIDIUM) 200 MG tablet Take 1 tablet (200 mg total) by mouth 3 (three) times daily as needed for pain. 8 tablet 0   No current facility-administered medications on file prior to visit.      The following portions of the patient's history were reviewed and  updated as appropriate: allergies, current medications, family history, past medical history, social history, past surgical history and problem list.  Review of Systems:  Other than those mentioned in HPI all ROS negative   Objective:  Physical Exam BP 108/65   Pulse 67   Ht 5' (1.524 m)   Wt 98 lb 9.6 oz (44.7 kg)   BMI 19.26 kg/m  CONSTITUTIONAL: Well-developed, well-nourished female in no acute distress.  EYES: EOM intact, conjunctivae normal, no scleral icterus HEAD: Normocephalic, atraumatic RESPIRATORY:  Effort  normal, no problems with respiration noted. MUSCULOSKELETAL: Normal range of motion. No tenderness. SKIN: Skin is warm and dry. No rash noted. Not diaphoretic. No erythema. No pallor. PSYCHIATRIC: Normal mood and affect. Normal behavior. Normal judgment and thought content.   Assessment & Plan:   1. Encounter for initial prescription of vaginal ring hormonal contraceptive -Reviewed all forms of birth control options available including abstinence; over the counter/barrier methods; hormonal contraceptive medication including pill, patch, ring, injection,contraceptive implant; hormonal and nonhormonal IUDs. Risks and benefits reviewed.  Questions were answered. Pt would like to use the nuvaring. Rx sent with refills for 1 year.   - etonogestrel-ethinyl estradiol (NUVARING) 0.12-0.015 MG/24HR vaginal ring; Insert vaginally and leave in place for 3 consecutive weeks, then remove for 1 week.  Dispense: 1 each; Refill: 12  2. Nexplanon removal  Nexplanon Removal  Patient identified, informed consent performed, consent signed. Appropriate time out taken. Nexplanon site identified.  Area prepped in usual sterile fashon. One ml of 1% lidocaine was used to anesthetize the area at the distal end of the implant. A small stab incision was made right beside the implant on the distal portion. The Nexplanon rod was grasped using hemostats and removed without difficulty. There was minimal  blood loss. There were no complications.    Judeth HornLawrence, Careli Luzader, NP 08/10/2018 8:58 AM

## 2018-08-11 ENCOUNTER — Encounter (HOSPITAL_COMMUNITY): Payer: Self-pay | Admitting: *Deleted

## 2018-08-11 ENCOUNTER — Other Ambulatory Visit: Payer: Self-pay

## 2018-08-11 ENCOUNTER — Emergency Department (HOSPITAL_COMMUNITY)
Admission: EM | Admit: 2018-08-11 | Discharge: 2018-08-12 | Disposition: A | Discharge status: Home / Self Care | Payer: Medicaid Other | Attending: Emergency Medicine | Admitting: Emergency Medicine

## 2018-08-11 DIAGNOSIS — F17200 Nicotine dependence, unspecified, uncomplicated: Secondary | ICD-10-CM | POA: Insufficient documentation

## 2018-08-11 DIAGNOSIS — R112 Nausea with vomiting, unspecified: Secondary | ICD-10-CM

## 2018-08-11 DIAGNOSIS — F419 Anxiety disorder, unspecified: Secondary | ICD-10-CM | POA: Diagnosis not present

## 2018-08-11 DIAGNOSIS — F319 Bipolar disorder, unspecified: Secondary | ICD-10-CM | POA: Diagnosis not present

## 2018-08-11 DIAGNOSIS — Z9049 Acquired absence of other specified parts of digestive tract: Secondary | ICD-10-CM | POA: Diagnosis not present

## 2018-08-11 DIAGNOSIS — R1013 Epigastric pain: Secondary | ICD-10-CM | POA: Diagnosis not present

## 2018-08-11 DIAGNOSIS — R1012 Left upper quadrant pain: Secondary | ICD-10-CM | POA: Diagnosis present

## 2018-08-11 MED ORDER — SODIUM CHLORIDE 0.9% FLUSH: 3.0000 mL | Freq: Once | INTRAVENOUS | Status: DC

## 2018-08-11 MED ORDER — ONDANSETRON 4 MG PO TBDP
4.0000 mg | ORAL_TABLET | Freq: Once | ORAL | Status: DC | PRN
  Filled 2018-08-11: qty 1

## 2018-08-11 NOTE — ED Triage Notes (Signed)
Pt c/o vomiting and abd pain x 2 days.  Pt denies diarrhea.

## 2018-08-12 LAB — CBC
HCT: 37.8 % (ref 36.0–46.0)
Hemoglobin: 12.3 g/dL (ref 12.0–15.0)
MCH: 30.8 pg (ref 26.0–34.0)
MCHC: 32.5 g/dL (ref 30.0–36.0)
MCV: 94.7 fL (ref 80.0–100.0)
Platelets: 188 10*3/uL (ref 150–400)
RBC: 3.99 MIL/uL (ref 3.87–5.11)
RDW: 11.8 % (ref 11.5–15.5)
WBC: 7.4 10*3/uL (ref 4.0–10.5)
nRBC: 0 % (ref 0.0–0.2)

## 2018-08-12 LAB — COMPREHENSIVE METABOLIC PANEL
ALBUMIN: 4.1 g/dL (ref 3.5–5.0)
ALT: 15 U/L (ref 0–44)
AST: 17 U/L (ref 15–41)
Alkaline Phosphatase: 37 U/L — ABNORMAL LOW (ref 38–126)
Anion gap: 7 (ref 5–15)
BUN: 19 mg/dL (ref 6–20)
CHLORIDE: 108 mmol/L (ref 98–111)
CO2: 25 mmol/L (ref 22–32)
Calcium: 9 mg/dL (ref 8.9–10.3)
Creatinine, Ser: 0.56 mg/dL (ref 0.44–1.00)
GFR calc Af Amer: >60 mL/min (ref >60)
GFR calc non Af Amer: >60 mL/min (ref >60)
Glucose, Bld: 94 mg/dL (ref 70–99)
Potassium: 3.5 mmol/L (ref 3.5–5.1)
Sodium: 140 mmol/L (ref 135–145)
Total Bilirubin: 0.6 mg/dL (ref 0.3–1.2)
Total Protein: 6.8 g/dL (ref 6.5–8.1)

## 2018-08-12 LAB — LIPASE, BLOOD: Lipase: 29 U/L (ref 11–51)

## 2018-08-12 LAB — HCG, QUANTITATIVE, PREGNANCY: hCG, Beta Chain, Quant, S: <1 m[IU]/mL (ref <5)

## 2018-08-12 MED ORDER — ACETAMINOPHEN ER 650 MG PO TBCR: 650.0000 mg | EXTENDED_RELEASE_TABLET | Freq: Three times a day (TID) | ORAL | 0 refills | Status: DC | PRN

## 2018-08-12 MED ORDER — ACETAMINOPHEN 325 MG PO TABS
650.0000 mg | ORAL_TABLET | Freq: Once | ORAL | Status: AC
  Administered 2018-08-12: 650 mg via ORAL
  Filled 2018-08-12: qty 2

## 2018-08-12 MED ORDER — SODIUM CHLORIDE 0.9 % IV BOLUS
500.0000 mL | Freq: Once | INTRAVENOUS | Status: AC
  Administered 2018-08-12: 500 mL via INTRAVENOUS

## 2018-08-12 MED ORDER — ONDANSETRON 8 MG PO TBDP: 8.0000 mg | ORAL_TABLET | Freq: Three times a day (TID) | ORAL | 0 refills | Status: DC | PRN

## 2018-08-12 NOTE — Discharge Instructions (Signed)
We saw you in the ER for the abdominal pain, nausea, and poor appetite. All the results in the ER are normal and reassuring. We are not sure what is causing your symptoms.  This could be possibly a mild viral infection.  The workup in the ER is not complete, and is limited to screening for life threatening and emergent conditions only, so please see a primary care doctor for further evaluation.  Please return to the ER if your symptoms worsen; you have increased pain, fevers, chills, inability to keep any medications down, confusion. Otherwise see the outpatient doctor as requested.

## 2018-08-12 NOTE — ED Provider Notes (Addendum)
[facility]-EMERGENCY DEPT Provider Note   CSN: [id number] Arrival date & time: 08/11/18  2304     History   Chief Complaint Chief Complaint  Patient presents with  . Emesis  . Abdominal Pain    HPI Leah [name] is a 23 y.o. female.  HPI  23 year old female with history of anxiety, bipolar disorder and STDs comes in with chief complaint of abdominal pain and nausea. Patient reports that about 4 days ago she started having abdominal pain, nausea and anorexia.  She has had minimal appetite, when she does eat she is nauseous and starts dry heaving.  She had emesis x1 prior to ED arrival.  The abdominal pain is in the upper quadrants and described as sharp pain.  She has no history of similar pain in the past.  Pain is nonradiating.  Her last bowel movement was this morning and they have been normal.  Patient has past surgical history of cholecystectomy.  She does not have any UTI-like symptoms and denies any vaginal discharge, bleeding and she is sure she is not pregnant.  Past Medical History:  Diagnosis Date  . Anemia 2013  . Anxiety 2011   no meds during pregnancy  . Bipolar 1 disorder (HCC)   . Chlamydia   . Gall stones   . GERD (gastroesophageal reflux disease)   . 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 Seizure [facility](HCC) 2011   pseudoseizures    Patient Active Problem List   Diagnosis Date Noted  . S/P laparoscopic cholecystectomy 10/22/2017  . Pelvic pain 09/12/2017  . Amenorrhea, secondary 09/12/2017  . 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 UTI (urinary tract infection) 08/19/2016  . Suicide attempt by drug ingestion (HCC)   . Contraception 03/10/2014  . Biliary colic 03/10/2014  . BV (bacterial vaginosis) 03/10/2014  . Depression 03/16/2013  . Allergic rhinitis, seasonal 01/26/2013  . Anemia, iron deficiency 12/01/2012    Past Surgical History:  Procedure Laterality  Date  . CHOLECYSTECTOMY N/A 09/28/2014   Procedure: LAPAROSCOPIC CHOLECYSTECTOMY;  Surgeon: Axel FillerArmando Ramirez, MD;  Location: [facility] OR;  Service: General;  Laterality: N/A;  . CHOLECYSTECTOMY    . DIRECT LARYNGOSCOPY N/A 04/24/2014   Procedure: DIRECT LARYNGOSCOPY;  Surgeon: Darletta MollSui W Teoh, MD;  [facility];  Service: ENT;  Laterality: N/A;  . FOREIGN BODY REMOVAL ESOPHAGEAL N/A 04/24/2014   Procedure: REMOVAL FOREIGN BODY ESOPHAGEAL;  Surgeon: Darletta MollSui W Teoh, MD;  Location: [facility] OR;  Service: ENT;  Laterality: N/A;  . WISDOM TOOTH EXTRACTION  2013     OB History    Gravida  2   Para  1   Term  1   Preterm  0   AB  1   Living  1     SAB  1   TAB  0   Ectopic  0   Multiple      Live Births  1            Home Medications    Prior to Admission medications   Medication Sig Start Date End Date Taking? Authorizing Provider  MELATONIN PO Take 1 tablet by mouth at bedtime as needed (sleep).   Yes [provider]  acetaminophen (TYLENOL 8 HOUR) 650 MG CR tablet Take 1 tablet (650 mg total) by mouth every 8 (eight) hours as needed. 08/12/18   Derwood KaplanNanavati, Lashone Stauber, MD  doxycycline (VIBRAMYCIN) 100 MG capsule Take 1  capsule (100 mg total) by mouth 2 (two) times daily. One po bid x 7 days 01/12/18   Bethann Berkshire, MD  etonogestrel-ethinyl estradiol (NUVARING) 0.12-0.015 MG/24HR vaginal ring Insert vaginally and leave in place for 3 consecutive weeks, then remove for 1 week. 08/10/18   Judeth Horn, NP  metroNIDAZOLE (METROGEL) 0.75 % vaginal gel Place 1 application vaginally 2 (two) times daily as needed (For infection.).  12/29/17   [provider]  norelgestromin-ethinyl estradiol (ORTHO EVRA) 150-35 MCG/24HR transdermal patch Place 1 patch onto the skin once a week. 08/10/18   Judeth Horn, NP  ondansetron (ZOFRAN ODT) 8 MG disintegrating tablet Take 1 tablet (8 mg total) by mouth every 8 (eight) hours as needed for nausea. 08/12/18   Derwood Kaplan, MD  phenazopyridine  (PYRIDIUM) 200 MG tablet Take 1 tablet (200 mg total) by mouth 3 (three) times daily as needed for pain. 09/05/17   Little, Ambrose Finland, MD    Family History Family History  Problem Relation Age of Onset  . Arthritis Mother   . Bronchitis Mother   . Asthma Mother   . Hearing loss Paternal Grandfather   . Diabetes Neg Hx   . Stomach cancer Neg Hx   . Colon cancer Neg Hx     Social History Social History   Tobacco Use  . Smoking status: Light Tobacco Smoker  . Smokeless tobacco: Never Used  . Tobacco comment: marjiuna  Substance Use Topics  . Alcohol use: No    Alcohol/week: 6.0 standard drinks    Types: 6 Cans of beer per week  . Drug use: Yes    Types: Marijuana    Comment: occ.     Allergies   Bactrim [sulfamethoxazole-trimethoprim]   Review of Systems Review of Systems  Constitutional: Positive for activity change.  Gastrointestinal: Positive for abdominal pain and nausea.  All other systems reviewed and are negative.    Physical Exam Updated Vital Signs BP 110/68 (BP Location: Right Arm)   Pulse 85   Temp 98.3 F (36.8 C) (Oral)   Resp 16   Ht 5' (1.524 m)   Wt 47.6 kg   SpO2 100%   BMI 20.51 kg/m   Physical Exam Vitals signs and nursing note reviewed.  Constitutional:      Appearance: She is well-developed.  HENT:     Head: Atraumatic.  Neck:     Musculoskeletal: Normal range of motion and neck supple.  Cardiovascular:     Rate and Rhythm: Normal rate.  Pulmonary:     Effort: Pulmonary effort is normal.  Abdominal:     General: Bowel sounds are normal.     Tenderness: There is abdominal tenderness in the epigastric area and left upper quadrant. There is no guarding or rebound.  Skin:    General: Skin is warm and dry.  Neurological:     Mental Status: She is alert and oriented to person, place, and time.      ED Treatments / Results  Labs (all labs ordered are listed, but only abnormal results are displayed) Labs Reviewed    COMPREHENSIVE METABOLIC PANEL - Abnormal; Notable for the following components:      Result Value   Alkaline Phosphatase 37 (*)    All other components within normal limits  LIPASE, BLOOD  CBC  HCG, QUANTITATIVE, PREGNANCY  URINALYSIS, ROUTINE W REFLEX MICROSCOPIC    EKG None  Radiology No results found.  Procedures Procedures (including critical care time)  Medications Ordered in ED Medications  ondansetron (ZOFRAN-ODT) disintegrating tablet 4 mg (has no administration in time range)  sodium chloride 0.9 % bolus 500 mL (500 mLs Intravenous New Bag/Given 08/12/18 0101)  acetaminophen (TYLENOL) tablet 650 mg (650 mg Oral Given 08/12/18 0101)     Initial Impression / Assessment and Plan / ED Course  I have reviewed the triage vital signs and the nursing notes.  Pertinent labs & imaging results that were available during my care of the patient were reviewed by me and considered in my medical decision making (see chart for details).  Clinical Course as of Aug 12 150  Wed Aug 12, 2018  0150 Pt reassessed. Pt's VSS and WNL. Pt's cap refill < 3 seconds. Pt has been hydrated in the ER and now passed po challenge. We will discharge with antiemetic. Strict ER return precautions have been discussed and pt will return if he is unable to tolerate fluids and symptoms are getting worse.  All the labs overall are reassuring.  Hemodynamics continue to be stable.  Abdominal exam is unchanged and there is still no peritoneal findings.  Stable for discharge.  Strict ER return precautions discussed.   [AN]    Clinical Course User Index [AN] Derwood KaplanNanavati, Jeffie Spivack, MD    23 year old woman with history of cholecystectomy comes in with chief complaint of abdominal pain, nausea and anorexia.  On exam she is having epigastric and left upper quadrant tenderness without any peritoneal findings.  Patient has bowel movements have been normal.  She does not have any abdominal distention.  Differential  diagnosis includes gastroenteritis.  Other possibilities include small bowel obstruction or ileus.  Given that patient has had normal bowel movements and is she is passing flatus, likelihood of a complete bowel obstruction is low.  Abdominal exam also is overall reassuring.  Plan is to get basic labs and start hydration process.  If patient fails oral challenge then we will consider advanced imaging.  Final Clinical Impressions(s) / ED Diagnoses   Final diagnoses:  Non-intractable vomiting with nausea, unspecified vomiting type  Epigastric pain    ED Discharge Orders         Ordered    acetaminophen (TYLENOL 8 HOUR) 650 MG CR tablet  Every 8 hours PRN     08/12/18 0151    ondansetron (ZOFRAN ODT) 8 MG disintegrating tablet  Every 8 hours PRN     08/12/18 0151           Derwood KaplanNanavati, Kayleeann Huxford, MD 08/12/18 0036    Derwood KaplanNanavati, Evalynne Locurto, MD 08/12/18 [phone]

## 2018-08-12 NOTE — ED Notes (Addendum)
Pt given 4 oz of water for PO challenge. No emesis

## 2018-08-12 NOTE — ED Notes (Signed)
Pt stated "I can't pee right now and I don't want to try". RN aware. Pt going to be fluid challenged.

## 2018-08-12 NOTE — ED Notes (Signed)
Pt aware that urine sample is needed. Unable to provide at this time. Stated she has not eaten the past couple days. Had 4 episodes of emesis at hospital

## 2018-08-12 NOTE — ED Notes (Signed)
Pt verbalized discharge instructions and follow up care. Alert and ambulatory  

## 2018-09-09 ENCOUNTER — Ambulatory Visit (INDEPENDENT_AMBULATORY_CARE_PROVIDER_SITE_OTHER): Payer: Self-pay | Admitting: General Practice

## 2018-09-09 ENCOUNTER — Other Ambulatory Visit (HOSPITAL_COMMUNITY)
Admission: RE | Admit: 2018-09-09 | Discharge: 2018-09-09 | Disposition: A | Discharge status: Home / Self Care | Payer: Medicaid Other | Source: Ambulatory Visit | Attending: Obstetrics & Gynecology | Admitting: Obstetrics & Gynecology

## 2018-09-09 DIAGNOSIS — N898 Other specified noninflammatory disorders of vagina: Secondary | ICD-10-CM | POA: Insufficient documentation

## 2018-09-09 NOTE — Progress Notes (Signed)
Patient presents to office today reporting vaginal odor & discharge for the past 2 weeks. Patient states she has already been to the health department and tested negative for STDs, believes she has BV. Patient instructed in self swab & wet prep collected. Discussed we will contact her with any abnormal results. Pharmacy updated in system.   Chase Caller RN BSN 09/09/18

## 2018-09-09 NOTE — Progress Notes (Signed)
I have reviewed the chart and agree with nursing staff's documentation of this patient's encounter.  Vonzella Nipple, PA-C 09/09/2018 2:31 PM

## 2018-09-10 LAB — CERVICOVAGINAL ANCILLARY ONLY
Bacterial vaginitis: POSITIVE — AB
Candida vaginitis: NEGATIVE
Trichomonas: NEGATIVE

## 2018-09-11 ENCOUNTER — Other Ambulatory Visit: Payer: Self-pay | Admitting: Medical

## 2018-09-11 DIAGNOSIS — N760 Acute vaginitis: Principal | ICD-10-CM

## 2018-09-11 DIAGNOSIS — B9689 Other specified bacterial agents as the cause of diseases classified elsewhere: Secondary | ICD-10-CM

## 2018-09-11 MED ORDER — METRONIDAZOLE 500 MG PO TABS: 500.0000 mg | ORAL_TABLET | Freq: Two times a day (BID) | ORAL | 0 refills | Status: DC

## 2018-10-11 ENCOUNTER — Emergency Department (HOSPITAL_COMMUNITY): Payer: Medicaid Other

## 2018-10-11 ENCOUNTER — Other Ambulatory Visit: Payer: Self-pay

## 2018-10-11 ENCOUNTER — Emergency Department (HOSPITAL_COMMUNITY)
Admission: EM | Admit: 2018-10-11 | Discharge: 2018-10-11 | Disposition: A | Discharge status: Home / Self Care | Payer: Medicaid Other | Attending: Emergency Medicine | Admitting: Emergency Medicine

## 2018-10-11 DIAGNOSIS — R52 Pain, unspecified: Secondary | ICD-10-CM | POA: Insufficient documentation

## 2018-10-11 DIAGNOSIS — F17200 Nicotine dependence, unspecified, uncomplicated: Secondary | ICD-10-CM | POA: Insufficient documentation

## 2018-10-11 DIAGNOSIS — R05 Cough: Secondary | ICD-10-CM | POA: Diagnosis present

## 2018-10-11 DIAGNOSIS — R42 Dizziness and giddiness: Secondary | ICD-10-CM | POA: Insufficient documentation

## 2018-10-11 DIAGNOSIS — J069 Acute upper respiratory infection, unspecified: Secondary | ICD-10-CM | POA: Diagnosis not present

## 2018-10-11 DIAGNOSIS — B9789 Other viral agents as the cause of diseases classified elsewhere: Secondary | ICD-10-CM | POA: Diagnosis not present

## 2018-10-11 DIAGNOSIS — F1210 Cannabis abuse, uncomplicated: Secondary | ICD-10-CM | POA: Insufficient documentation

## 2018-10-11 LAB — I-STAT BETA HCG BLOOD, ED (MC, WL, AP ONLY): I-stat hCG, quantitative: <5 m[IU]/mL (ref <5)

## 2018-10-11 LAB — CBC WITH DIFFERENTIAL/PLATELET
ABS IMMATURE GRANULOCYTES: 0.02 10*3/uL (ref 0.00–0.07)
Basophils Absolute: 0 10*3/uL (ref 0.0–0.1)
Basophils Relative: 0 %
Eosinophils Absolute: 0.1 10*3/uL (ref 0.0–0.5)
Eosinophils Relative: 1 %
HCT: 37.3 % (ref 36.0–46.0)
Hemoglobin: 12.2 g/dL (ref 12.0–15.0)
Immature Granulocytes: 0 %
Lymphocytes Relative: 37 %
Lymphs Abs: 3 10*3/uL (ref 0.7–4.0)
MCH: 30.9 pg (ref 26.0–34.0)
MCHC: 32.7 g/dL (ref 30.0–36.0)
MCV: 94.4 fL (ref 80.0–100.0)
Monocytes Absolute: 0.7 10*3/uL (ref 0.1–1.0)
Monocytes Relative: 9 %
Neutro Abs: 4.3 10*3/uL (ref 1.7–7.7)
Neutrophils Relative %: 53 %
Platelets: 246 10*3/uL (ref 150–400)
RBC: 3.95 MIL/uL (ref 3.87–5.11)
RDW: 12.7 % (ref 11.5–15.5)
WBC: 8.2 10*3/uL (ref 4.0–10.5)
nRBC: 0 % (ref 0.0–0.2)

## 2018-10-11 LAB — BASIC METABOLIC PANEL
Anion gap: 8 (ref 5–15)
BUN: 15 mg/dL (ref 6–20)
CO2: 25 mmol/L (ref 22–32)
Calcium: 9.4 mg/dL (ref 8.9–10.3)
Chloride: 107 mmol/L (ref 98–111)
Creatinine, Ser: 0.55 mg/dL (ref 0.44–1.00)
GFR calc Af Amer: >60 mL/min (ref >60)
GFR calc non Af Amer: >60 mL/min (ref >60)
GLUCOSE: 84 mg/dL (ref 70–99)
Potassium: 3.7 mmol/L (ref 3.5–5.1)
Sodium: 140 mmol/L (ref 135–145)

## 2018-10-11 LAB — INFLUENZA PANEL BY PCR (TYPE A & B)
Influenza A By PCR: NEGATIVE
Influenza B By PCR: NEGATIVE

## 2018-10-11 MED ORDER — KETOROLAC TROMETHAMINE 15 MG/ML IJ SOLN
15.0000 mg | Freq: Once | INTRAMUSCULAR | Status: AC
  Administered 2018-10-11: 15 mg via INTRAVENOUS
  Filled 2018-10-11: qty 1

## 2018-10-11 MED ORDER — SODIUM CHLORIDE 0.9 % IV BOLUS
1000.0000 mL | Freq: Once | INTRAVENOUS | Status: AC
  Administered 2018-10-11: 1000 mL via INTRAVENOUS

## 2018-10-11 NOTE — ED Notes (Signed)
ED Provider at bedside. 

## 2018-10-11 NOTE — ED Provider Notes (Signed)
[facility]-EMERGENCY DEPT Provider Note   CSN: [id number] Arrival date & time: 10/11/18  2116    History   Chief Complaint Chief Complaint  Patient presents with  . Nasal Congestion  . Dizziness  . Generalized Body Aches    HPI Leah [name] is a 23 y.o. female who presents with URI symptoms and cough. PMH significant for anxiety, bipolar d/o, GERD.  The patient states that she was diagnosed with a URI about 2 weeks ago after she went to urgent care for nasal congestion and runny nose.  She was prescribed promethazine which she has been taking for cough.  She states that overall she has not been getting much better.  She went back to urgent care and they told her just to continue over-the-counter remedies.  She states she has been having a frontal headache with nasal congestion, sore throat, and body aches. Additionally she has been having a cough that is sometimes dry and sometimes productive of green sputum.  She estimates she's had about 5 episodes of non-bloody diarrhea daily. She went to work today and felt really dizzy like she was going to pass out.  She tried to rest and drink fluids but she felt persistently dizzy so she came to the ED tonight.  She reports an elevated temp of 99 without true fever at home.  She denies ear pain, chest pain, wheezing, abdominal pain, nausea, vomiting, urinary symptoms.  Last menstrual period was 1 month ago.  She denies any known sick contacts.  She has not recently traveled anywhere.    HPI  Past Medical History:  Diagnosis Date  . Anemia 2013  . Anxiety 2011   no meds during pregnancy  . Bipolar 1 disorder (HCC)   . Chlamydia   . Gall stones   . GERD (gastroesophageal reflux disease)   . 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 Seizure [facility]) 2011   pseudoseizures    Patient Active Problem List   Diagnosis Date Noted  . S/P laparoscopic cholecystectomy 10/22/2017  . Pelvic pain 09/12/2017  .  Amenorrhea, secondary 09/12/2017  . 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 UTI (urinary tract infection) 08/19/2016  . Suicide attempt by drug ingestion (HCC)   . Contraception 03/10/2014  . Biliary colic 03/10/2014  . BV (bacterial vaginosis) 03/10/2014  . Depression 03/16/2013  . Allergic rhinitis, seasonal 01/26/2013  . Anemia, iron deficiency 12/01/2012    Past Surgical History:  Procedure Laterality Date  . CHOLECYSTECTOMY N/A 09/28/2014   Procedure: LAPAROSCOPIC CHOLECYSTECTOMY;  Surgeon: Axel Filler, MD;  Location: [facility] OR;  Service: General;  Laterality: N/A;  . CHOLECYSTECTOMY    . DIRECT LARYNGOSCOPY N/A 04/24/2014   Procedure: DIRECT LARYNGOSCOPY;  Surgeon: [name], MD;  Location: [facility] OR;  Service: ENT;  Laterality: N/A;  . FOREIGN BODY REMOVAL ESOPHAGEAL N/A 04/24/2014   Procedure: REMOVAL FOREIGN BODY ESOPHAGEAL;  Surgeon: [name], MD;  Location: [facility] OR;  Service: ENT;  Laterality: N/A;  . WISDOM TOOTH EXTRACTION  2013     OB History    Gravida  2   Para  1   Term  1   Preterm  0   AB  1   Living  1     SAB  1   TAB  0   Ectopic  0   Multiple      Live Births  1  Home Medications    Prior to Admission medications   Medication Sig Start Date End Date Taking? Authorizing Provider  acetaminophen (TYLENOL 8 HOUR) 650 MG CR tablet Take 1 tablet (650 mg total) by mouth every 8 (eight) hours as needed. 08/12/18   Derwood Kaplan, MD  doxycycline (VIBRAMYCIN) 100 MG capsule Take 1 capsule (100 mg total) by mouth 2 (two) times daily. One po bid x 7 days 01/12/18   Bethann Berkshire, MD  etonogestrel-ethinyl estradiol (NUVARING) 0.12-0.015 MG/24HR vaginal ring Insert vaginally and leave in place for 3 consecutive weeks, then remove for 1 week. 08/10/18   Judeth Horn, NP  MELATONIN PO Take 1 tablet by mouth at bedtime as needed (sleep).    [provider]  metroNIDAZOLE (FLAGYL) 500  MG tablet Take 1 tablet (500 mg total) by mouth 2 (two) times daily. 09/11/18   Marny Lowenstein, PA-C  metroNIDAZOLE (METROGEL) 0.75 % vaginal gel Place 1 application vaginally 2 (two) times daily as needed (For infection.).  12/29/17   [provider]  norelgestromin-ethinyl estradiol (ORTHO EVRA) 150-35 MCG/24HR transdermal patch Place 1 patch onto the skin once a week. 08/10/18   Judeth Horn, NP  ondansetron (ZOFRAN ODT) 8 MG disintegrating tablet Take 1 tablet (8 mg total) by mouth every 8 (eight) hours as needed for nausea. 08/12/18   Derwood Kaplan, MD  phenazopyridine (PYRIDIUM) 200 MG tablet Take 1 tablet (200 mg total) by mouth 3 (three) times daily as needed for pain. 09/05/17   Little, Ambrose Finland, MD    Family History Family History  Problem Relation Age of Onset  . Arthritis Mother   . Bronchitis Mother   . Asthma Mother   . Hearing loss Paternal Grandfather   . Diabetes Neg Hx   . Stomach cancer Neg Hx   . Colon cancer Neg Hx     Social History Social History   Tobacco Use  . Smoking status: Light Tobacco Smoker  . Smokeless tobacco: Never Used  . Tobacco comment: marjiuna  Substance Use Topics  . Alcohol use: No    Alcohol/week: 6.0 standard drinks    Types: 6 Cans of beer per week  . Drug use: Yes    Types: Marijuana    Comment: occ.     Allergies   Bactrim [sulfamethoxazole-trimethoprim]   Review of Systems Review of Systems  Constitutional: Positive for fatigue. Negative for fever.  HENT: Positive for congestion, rhinorrhea and sore throat. Negative for ear pain.   Respiratory: Positive for cough. Negative for shortness of breath.   Cardiovascular: Negative for chest pain.  Gastrointestinal: Positive for diarrhea. Negative for abdominal pain, nausea and vomiting.  Genitourinary: Negative for dysuria.  Neurological: Positive for weakness and light-headedness. Negative for dizziness.  All other systems reviewed and are negative.     Physical Exam Updated Vital Signs BP 124/79 (BP Location: Left Arm)   Pulse 77   Temp 99.3 F (37.4 C) (Oral)   Resp 20   Ht 5' (1.524 m)   Wt 48.5 kg   LMP 09/13/2018   SpO2 100%   BMI 20.90 kg/m   Physical Exam Vitals signs and nursing note reviewed.  Constitutional:      General: She is not in acute distress.    Appearance: Normal appearance. She is well-developed. She is not ill-appearing.     Comments: Calm and cooperative.  Fatigued appearing  HENT:     Head: Normocephalic and atraumatic.     Right Ear: Tympanic membrane normal.  Left Ear: Tympanic membrane normal.     Nose: Rhinorrhea present.     Mouth/Throat:     Mouth: Mucous membranes are moist.     Pharynx: No posterior oropharyngeal erythema.  Eyes:     General: No scleral icterus.       Right eye: No discharge.        Left eye: No discharge.     Conjunctiva/sclera: Conjunctivae normal.     Pupils: Pupils are equal, round, and reactive to light.  Neck:     Musculoskeletal: Normal range of motion.  Cardiovascular:     Rate and Rhythm: Normal rate and regular rhythm.  Pulmonary:     Effort: Pulmonary effort is normal. No respiratory distress.     Breath sounds: Normal breath sounds.  Abdominal:     General: There is no distension.     Palpations: Abdomen is soft.     Tenderness: There is no abdominal tenderness.  Skin:    General: Skin is warm and dry.  Neurological:     Mental Status: She is alert and oriented to person, place, and time.  Psychiatric:        Behavior: Behavior normal.      ED Treatments / Results  Labs (all labs ordered are listed, but only abnormal results are displayed) Labs Reviewed  INFLUENZA PANEL BY PCR (TYPE A & B)  BASIC METABOLIC PANEL  CBC WITH DIFFERENTIAL/PLATELET  I-STAT BETA HCG BLOOD, ED ([facility], [facility], [facility] ONLY)    EKG None  Radiology Dg Chest Portable 1 View  Result Date: 10/11/2018 CLINICAL DATA:  23 year old female with upper respiratory tract  infection. Dizziness and productive cough. EXAM: PORTABLE CHEST 1 VIEW COMPARISON:  Chest and abdominal radiographs 12/16/2017 and earlier. FINDINGS: Portable AP semi upright view at 2151 hours. Normal lung volumes and mediastinal contours. Visualized tracheal air column is within normal limits. Allowing for portable technique the lungs are clear. No pleural effusion. Negative visible bowel gas pattern. No osseous abnormality identified. IMPRESSION: Negative.  No cardiopulmonary abnormality. Electronically Signed   By: Odessa Fleming M.D.   On: 10/11/2018 22:47    Procedures Procedures (including critical care time)  Medications Ordered in ED Medications  sodium chloride 0.9 % bolus 1,000 mL (0 mLs Intravenous Stopped 10/11/18 2317)  ketorolac (TORADOL) 15 MG/ML injection 15 mg (15 mg Intravenous Given 10/11/18 2223)     Initial Impression / Assessment and Plan / ED Course  I have reviewed the triage vital signs and the nursing notes.  Pertinent labs & imaging results that were available during my care of the patient were reviewed by me and considered in my medical decision making (see chart for details).  23 year old female presents with URI symptoms, cough, diarrhea for about 2 weeks. She was dizzy today which brought her here. Vitals are normal. Exam is unremarkable. Will obtain labs, flu, CXR. Low concern for COVID as she has not had known sick contacts or travel. Will give fluids and Toradol for body aches.  Labs are normal, chest x-ray is normal, flu is negative.  She states that Toradol has not helped with her body aches.  She is advised to stay home from work, rest, hydrate until her symptoms improved.  She was told to alternate Tylenol and Motrin as needed for body aches.  She was given a work note and return precautions.   Final Clinical Impressions(s) / ED Diagnoses   Final diagnoses:  Viral URI with cough  Lightheaded  Body  aches    ED Discharge Orders    None       Beryle Quant 10/11/18 2319    Mancel Bale, MD 10/15/18 [phone]

## 2018-10-11 NOTE — Discharge Instructions (Signed)
Please rest and drink plenty of fluids Alternate Tylenol and Ibuprofen for pain or fever Continue Promethazine for cough as needed Return if you are worsening

## 2018-10-11 NOTE — ED Triage Notes (Signed)
Pt reports she has been diagnosed with a URI x2 by urgent care and today she felt dizzy and has been having cough and has been coughing up green. Pt denies SOB, N/V but reports she has had diarrhea.  Pt has a cough in triage.

## 2018-10-11 NOTE — ED Notes (Signed)
X-ray at bedside

## 2018-12-29 ENCOUNTER — Encounter (HOSPITAL_COMMUNITY): Payer: Self-pay

## 2018-12-29 ENCOUNTER — Emergency Department (HOSPITAL_COMMUNITY): Payer: Medicaid Other

## 2018-12-29 ENCOUNTER — Emergency Department (HOSPITAL_COMMUNITY)
Admission: EM | Admit: 2018-12-29 | Discharge: 2018-12-29 | Disposition: A | Discharge status: Home / Self Care | Payer: Medicaid Other | Attending: Emergency Medicine | Admitting: Emergency Medicine

## 2018-12-29 ENCOUNTER — Other Ambulatory Visit: Payer: Self-pay

## 2018-12-29 DIAGNOSIS — Z720 Tobacco use: Secondary | ICD-10-CM | POA: Insufficient documentation

## 2018-12-29 DIAGNOSIS — R102 Pelvic and perineal pain: Secondary | ICD-10-CM | POA: Insufficient documentation

## 2018-12-29 DIAGNOSIS — R079 Chest pain, unspecified: Secondary | ICD-10-CM | POA: Diagnosis present

## 2018-12-29 DIAGNOSIS — Z79899 Other long term (current) drug therapy: Secondary | ICD-10-CM | POA: Diagnosis not present

## 2018-12-29 DIAGNOSIS — R0789 Other chest pain: Secondary | ICD-10-CM | POA: Diagnosis not present

## 2018-12-29 LAB — D-DIMER, QUANTITATIVE: D-Dimer, Quant: 0.91 ug/mL-FEU — ABNORMAL HIGH (ref 0.00–0.50)

## 2018-12-29 LAB — LIPASE, BLOOD: Lipase: 29 U/L (ref 11–51)

## 2018-12-29 LAB — CBC
HCT: 37.3 % (ref 36.0–46.0)
Hemoglobin: 12.3 g/dL (ref 12.0–15.0)
MCH: 31.1 pg (ref 26.0–34.0)
MCHC: 33 g/dL (ref 30.0–36.0)
MCV: 94.4 fL (ref 80.0–100.0)
Platelets: 225 10*3/uL (ref 150–400)
RBC: 3.95 MIL/uL (ref 3.87–5.11)
RDW: 11.9 % (ref 11.5–15.5)
WBC: 6 10*3/uL (ref 4.0–10.5)
nRBC: 0 % (ref 0.0–0.2)

## 2018-12-29 LAB — COMPREHENSIVE METABOLIC PANEL
ALT: 27 U/L (ref 0–44)
AST: 24 U/L (ref 15–41)
Albumin: 3.8 g/dL (ref 3.5–5.0)
Alkaline Phosphatase: 44 U/L (ref 38–126)
Anion gap: 4 — ABNORMAL LOW (ref 5–15)
BUN: 11 mg/dL (ref 6–20)
CO2: 23 mmol/L (ref 22–32)
Calcium: 8.7 mg/dL — ABNORMAL LOW (ref 8.9–10.3)
Chloride: 109 mmol/L (ref 98–111)
Creatinine, Ser: 0.64 mg/dL (ref 0.44–1.00)
GFR calc Af Amer: >60 mL/min (ref >60)
GFR calc non Af Amer: >60 mL/min (ref >60)
Glucose, Bld: 87 mg/dL (ref 70–99)
Potassium: 4 mmol/L (ref 3.5–5.1)
Sodium: 136 mmol/L (ref 135–145)
Total Bilirubin: 0.5 mg/dL (ref 0.3–1.2)
Total Protein: 7.1 g/dL (ref 6.5–8.1)

## 2018-12-29 LAB — I-STAT BETA HCG BLOOD, ED (MC, WL, AP ONLY): I-stat hCG, quantitative: <5 m[IU]/mL (ref <5)

## 2018-12-29 LAB — TROPONIN I: Troponin I: <0.03 ng/mL (ref <0.03)

## 2018-12-29 LAB — SEDIMENTATION RATE: Sed Rate: 18 mm/hr (ref 0–22)

## 2018-12-29 MED ORDER — SODIUM CHLORIDE 0.9% FLUSH
3.0000 mL | Freq: Once | INTRAVENOUS | Status: AC
  Administered 2018-12-29: 3 mL via INTRAVENOUS

## 2018-12-29 MED ORDER — SODIUM CHLORIDE (PF) 0.9 % IJ SOLN
INTRAMUSCULAR | Status: AC
  Filled 2018-12-29: qty 50

## 2018-12-29 MED ORDER — KETOROLAC TROMETHAMINE 30 MG/ML IJ SOLN
30.0000 mg | Freq: Once | INTRAMUSCULAR | Status: AC
  Administered 2018-12-29: 30 mg via INTRAVENOUS
  Filled 2018-12-29: qty 1

## 2018-12-29 MED ORDER — SODIUM CHLORIDE 0.9 % IV BOLUS
1000.0000 mL | Freq: Once | INTRAVENOUS | Status: AC
  Administered 2018-12-29: 1000 mL via INTRAVENOUS

## 2018-12-29 MED ORDER — ACETAMINOPHEN 500 MG PO TABS
1000.0000 mg | ORAL_TABLET | Freq: Once | ORAL | Status: AC
  Administered 2018-12-29: 1000 mg via ORAL
  Filled 2018-12-29: qty 2

## 2018-12-29 MED ORDER — IOHEXOL 350 MG/ML SOLN
100.0000 mL | Freq: Once | INTRAVENOUS | Status: AC | PRN
  Administered 2018-12-29: 100 mL via INTRAVENOUS

## 2018-12-29 NOTE — ED Provider Notes (Signed)
Care assumed from C. Gibbons PA-C.  Please see her full H&P.  In short,  Leah [name] is a 23 y.o. female presents for emesis and right sided chest pain. Work up significant for elevated D dimer 0.91, negative troponin. EKG without ischemic changes. CTA pending.  Physical Exam  BP 105/72   Pulse 77   Temp 99.2 F (37.3 C) (Oral)   Resp (!) 22   Wt 48.5 kg   LMP 12/07/2018   SpO2 100%   BMI 20.88 kg/m   Physical Exam  PE: Constitutional: well-developed, well-nourished, no apparent distress HENT: normocephalic, atraumatic. no cervical adenopathy Cardiovascular: normal rate and rhythm, distal pulses intact Pulmonary/Chest: effort normal; breath sounds clear and equal bilaterally; no wheezes or rales Abdominal: soft and nontender Musculoskeletal: full ROM, no edema Neurological: alert with goal directed thinking Skin: warm and dry, no rash, no diaphoresis Psychiatric: normal mood and affect, normal behavior    ED Course/Procedures     Results for orders placed or performed during the hospital encounter of 12/29/18 (from the past 24 hour(s))  Lipase, blood     Status: None   Collection Time: 12/29/18  1:39 PM  Result Value Ref Range   Lipase 29 11 - 51 U/L  Comprehensive metabolic panel     Status: Abnormal   Collection Time: 12/29/18  1:39 PM  Result Value Ref Range   Sodium 136 135 - 145 mmol/L   Potassium 4.0 3.5 - 5.1 mmol/L   Chloride 109 98 - 111 mmol/L   CO2 23 22 - 32 mmol/L   Glucose, Bld 87 70 - 99 mg/dL   BUN 11 6 - 20 mg/dL   Creatinine, Ser 0.64 0.44 - 1.00 mg/dL   Calcium 8.7 (L) 8.9 - 10.3 mg/dL   Total Protein 7.1 6.5 - 8.1 g/dL   Albumin 3.8 3.5 - 5.0 g/dL   AST 24 15 - 41 U/L   ALT 27 0 - 44 U/L   Alkaline Phosphatase 44 38 - 126 U/L   Total Bilirubin 0.5 0.3 - 1.2 mg/dL   GFR calc non Af Amer >60 >60 mL/min   GFR calc Af Amer >60 >60 mL/min   Anion gap 4 (L) 5 - 15  CBC     Status: None   Collection Time: 12/29/18  1:39 PM  Result Value Ref  Range   WBC 6.0 4.0 - 10.5 K/uL   RBC 3.95 3.87 - 5.11 MIL/uL   Hemoglobin 12.3 12.0 - 15.0 g/dL   HCT 37.3 36.0 - 46.0 %   MCV 94.4 80.0 - 100.0 fL   MCH 31.1 26.0 - 34.0 pg   MCHC 33.0 30.0 - 36.0 g/dL   RDW 11.9 11.5 - 15.5 %   Platelets 225 150 - 400 K/uL   nRBC 0.0 0.0 - 0.2 %  Troponin I - ONCE - STAT     Status: None   Collection Time: 12/29/18  1:39 PM  Result Value Ref Range   Troponin I <0.03 <0.03 ng/mL  D-dimer, quantitative (not at [facility])     Status: Abnormal   Collection Time: 12/29/18  1:39 PM  Result Value Ref Range   D-Dimer, Quant 0.91 (H) 0.00 - 0.50 ug/mL-FEU  I-Stat beta hCG blood, ED     Status: None   Collection Time: 12/29/18  1:44 PM  Result Value Ref Range   I-stat hCG, quantitative <5.0 <5 mIU/mL   Comment 3  CHEST - 2 VIEW COMPARISON: October 11, 2018 FINDINGS: The lungs are clear. The heart size and pulmonary vascularity are normal. No adenopathy. No pneumothorax. No bone lesions. IMPRESSION: No edema or consolidation. Electronically Signed By: Bretta BangWilliam Woodruff III M.D. On: 12/29/2018 13:35   CT ANGIOGRAPHY CHEST WITH CONTRAST TECHNIQUE: Multidetector CT imaging of the chest was performed using the standard protocol during bolus administration of intravenous contrast. Multiplanar CT image reconstructions and MIPs were obtained to evaluate the vascular anatomy. CONTRAST: 100mL OMNIPAQUE IOHEXOL 350 MG/ML SOLN COMPARISON: None. FINDINGS: Cardiovascular: Satisfactory opacification of the pulmonary arteries to the segmental level. No evidence of pulmonary embolism. Normal heart size. No pericardial effusion. Mediastinum/Nodes: No enlarged mediastinal, hilar, or axillary lymph nodes. Age-appropriate thymic remnant in the anterior mediastinum. Thyroid gland, trachea, and esophagus demonstrate no significant findings. Lungs/Pleura: Lungs are clear. No pleural effusion or pneumothorax. Upper Abdomen: No acute abnormality. Musculoskeletal: No chest wall  abnormality. No acute or significant osseous findings. Review of the MIP images confirms the above findings. IMPRESSION: Negative examination for pulmonary embolism. Electronically SignedBy: Lauralyn PrimesAlex Bibbey M.D. On: 12/29/2018 15:30     MDM   CTA negative for PE. ACS unlikely cause of pain as Heart score less than 3 and duration of pain, constant x4 days. Discussed CTA results with pt and agree with previous provider cp likely with MSK etiology. Pt understands diagnosis and has no further questions. She is stable at discharge. Recommend pcp follow up and NSAIDs, tylenol for pain.   This note was prepared using Dragon voice recognition software and may include unintentional dictation errors due to the inherent limitations of voice recognition software.        Kathyrn Lasslbrizze, Kaitlyn E, PA-C 12/29/18 2109    Jacalyn LefevreHaviland, Julie, MD 12/29/18 2112

## 2018-12-29 NOTE — ED Triage Notes (Signed)
Pt states she started having emesis on Saturday. Pt states that she then began having diarrhea as well on Sunday, as well as right sided chest pain that gave her pain with breathing. Pt states the diarrhea has not been as much since Sunday. Pt endorses emesis x1 today.

## 2018-12-29 NOTE — ED Notes (Signed)
Patient transported to XR. 

## 2018-12-29 NOTE — ED Provider Notes (Signed)
[facility]-EMERGENCY DEPT Provider Note   CSN: [id number] Arrival date & time: 12/29/18  1224    History   Chief Complaint Chief Complaint  Patient presents with  . Emesis  . Chest Pain    HPI Leah [name] is a 23 y.o. female presents to the ER for evaluation of chest pain.  Onset Saturday evening.  Described as sharp like a twist, to the entire front part of her chest.  This is been constant since Saturday positional and pleuritic, worse with laughing, sitting up, breathing in and moving.  Pain is a 9/10. Non radiating. Associated with mild shortness of breath described as "having to breathe harder" and pain with breathing.  States if she talks for too long she feels like she is winded.  A few hours after the chest pain began she had several episodes of nonbloody nonbilious emesis and one episode of nonbloody non-melanotic diarrhea.  The nausea, vomiting and diarrhea have resolved but the chest pain has persisted.  She took OTC ibuprofen last night without relief.  Reports history of acid reflux during pregnancy but none since.    She uses ortho evra transfermal patch for birth control, removed it last week for expected menses soon.  No history of DVT/PE.  No recent surgeries.  No prolonged immobilization, recent travel, no associated leg swelling or calf pain, hemoptysis.  No fever, chills, congestion, sore throat, cough, nausea, vomiting, diarrhea, abdominal pain. Rare ETOH use. No illicit drug use. No sick contacts but works at a [employer].  No travel. No exposure to COVID. No h/o gastritis, PUD, GERd. S/p Cholecystectomy.      HPI  Past Medical History:  Diagnosis Date  . Anemia 2013  . Anxiety 2011   no meds during pregnancy  . Bipolar 1 disorder (HCC)   . Chlamydia   . Gall stones   . GERD (gastroesophageal reflux disease)   . 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 Seizure [facility](HCC) 2011   pseudoseizures    Patient Active Problem  List   Diagnosis Date Noted  . S/P laparoscopic cholecystectomy 10/22/2017  . Pelvic pain 09/12/2017  . Amenorrhea, secondary 09/12/2017  . 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 UTI (urinary tract infection) 08/19/2016  . Suicide attempt by drug ingestion (HCC)   . Contraception 03/10/2014  . Biliary colic 03/10/2014  . BV (bacterial vaginosis) 03/10/2014  . Depression 03/16/2013  . Allergic rhinitis, seasonal 01/26/2013  . Anemia, iron deficiency 12/01/2012    Past Surgical History:  Procedure Laterality Date  . CHOLECYSTECTOMY N/A 09/28/2014   Procedure: LAPAROSCOPIC CHOLECYSTECTOMY;  Surgeon: Axel FillerArmando Ramirez, MD;  Location: [facility] OR;  Service: General;  Laterality: N/A;  . CHOLECYSTECTOMY    . DIRECT LARYNGOSCOPY N/A 04/24/2014   Procedure: DIRECT LARYNGOSCOPY;  Surgeon: Darletta MollSui W Teoh, MD;  [facility];  Service: ENT;  Laterality: N/A;  . FOREIGN BODY REMOVAL ESOPHAGEAL N/A 04/24/2014   Procedure: REMOVAL FOREIGN BODY ESOPHAGEAL;  Surgeon: Darletta MollSui W Teoh, MD;  Location: [facility] OR;  Service: ENT;  Laterality: N/A;  . WISDOM TOOTH EXTRACTION  2013     OB History    Gravida  2   Para  1   Term  1   Preterm  0   AB  1   Living  1     SAB  1   TAB  0   Ectopic  0  Multiple      Live Births  1            Home Medications    Prior to Admission medications   Medication Sig Start Date End Date Taking? Authorizing Provider  acetaminophen (TYLENOL 8 HOUR) 650 MG CR tablet Take 1 tablet (650 mg total) by mouth every 8 (eight) hours as needed. 08/12/18  Yes Nanavati, Ankit, MD  MELATONIN PO Take 1 tablet by mouth at bedtime as needed (sleep).   Yes [provider]  norelgestromin-ethinyl estradiol (ORTHO EVRA) 150-35 MCG/24HR transdermal patch Place 1 patch onto the skin once a week. 08/10/18  Yes Jorje Guild, NP  etonogestrel-ethinyl estradiol (NUVARING) 0.12-0.015 MG/24HR vaginal ring Insert vaginally and leave  in place for 3 consecutive weeks, then remove for 1 week. Patient not taking: Reported on 12/29/2018 08/10/18   Jorje Guild, NP  ondansetron (ZOFRAN ODT) 8 MG disintegrating tablet Take 1 tablet (8 mg total) by mouth every 8 (eight) hours as needed for nausea. Patient not taking: Reported on 12/29/2018 08/12/18   Varney Biles, MD  phenazopyridine (PYRIDIUM) 200 MG tablet Take 1 tablet (200 mg total) by mouth 3 (three) times daily as needed for pain. Patient not taking: Reported on 12/29/2018 09/05/17   Little, Wenda Overland, MD    Family History Family History  Problem Relation Age of Onset  . Arthritis Mother   . Bronchitis Mother   . Asthma Mother   . Hearing loss Paternal Grandfather   . Diabetes Neg Hx   . Stomach cancer Neg Hx   . Colon cancer Neg Hx     Social History Social History   Tobacco Use  . Smoking status: Light Tobacco Smoker  . Smokeless tobacco: Never Used  . Tobacco comment: marjiuna  Substance Use Topics  . Alcohol use: No    Alcohol/week: 6.0 standard drinks    Types: 6 Cans of beer per week  . Drug use: Yes    Types: Marijuana    Comment: occ.     Allergies   Bactrim [sulfamethoxazole-trimethoprim]   Review of Systems Review of Systems  Respiratory: Positive for shortness of breath.   Cardiovascular: Positive for chest pain.  Gastrointestinal: Positive for diarrhea (resolved), nausea (resolved) and vomiting (resolved).  All other systems reviewed and are negative.    Physical Exam Updated Vital Signs BP 105/72   Pulse 77   Temp 99.2 F (37.3 C) (Oral)   Resp (!) 22   Wt 48.5 kg   LMP 12/07/2018   SpO2 100%   BMI 20.88 kg/m   Physical Exam Constitutional:      Appearance: She is well-developed.     Comments: NAD. Non toxic.   HENT:     Head: Normocephalic and atraumatic.     Nose: Nose normal.  Eyes:     General: Lids are normal.     Conjunctiva/sclera: Conjunctivae normal.  Neck:     Musculoskeletal: Normal range of motion.      Trachea: Trachea normal.     Comments: Trachea midline.  Cardiovascular:     Rate and Rhythm: Normal rate and regular rhythm.     Pulses:          Radial pulses are 1+ on the right side and 1+ on the left side.       Dorsalis pedis pulses are 1+ on the right side and 1+ on the left side.     Heart sounds: Normal heart sounds, S1 normal and S2 normal.  Comments: No LE edema or calf tenderness.  Pulmonary:     Effort: Pulmonary effort is normal.     Breath sounds: Normal breath sounds.  Chest:     Chest wall: Tenderness present.     Comments: Bilateral parasternal tenderness. Pain reproduced with deep breathing, sitting up and active ROM of upper extremities against resistance. No overlaying chest wall rash Abdominal:     General: Bowel sounds are normal.     Palpations: Abdomen is soft.     Tenderness: There is no abdominal tenderness.     Comments: No epigastric tenderness. No distention.   Skin:    General: Skin is warm and dry.     Capillary Refill: Capillary refill takes less than 2 seconds.     Comments: No rash to chest wall  Neurological:     Mental Status: She is alert.     GCS: GCS eye subscore is 4. GCS verbal subscore is 5. GCS motor subscore is 6.  Psychiatric:        Speech: Speech normal.        Behavior: Behavior normal.        Thought Content: Thought content normal.      ED Treatments / Results  Labs (all labs ordered are listed, but only abnormal results are displayed) Labs Reviewed  COMPREHENSIVE METABOLIC PANEL - Abnormal; Notable for the following components:      Result Value   Calcium 8.7 (*)    Anion gap 4 (*)    All other components within normal limits  D-DIMER, QUANTITATIVE (NOT AT [facility]) - Abnormal; Notable for the following components:   D-Dimer, Quant 0.91 (*)    All other components within normal limits  LIPASE, BLOOD  CBC  TROPONIN I  SEDIMENTATION RATE  C-REACTIVE PROTEIN  I-STAT BETA HCG BLOOD, ED ([facility], [facility], [facility] ONLY)    EKG  None  Radiology Dg Chest 2 View  Result Date: 12/29/2018 CLINICAL DATA:  Shortness of breath and chest pain EXAM: CHEST - 2 VIEW COMPARISON:  October 11, 2018 FINDINGS: The lungs are clear. The heart size and pulmonary vascularity are normal. No adenopathy. No pneumothorax. No bone lesions. IMPRESSION: No edema or consolidation. Electronically Signed   By: Bretta BangWilliam  Woodruff III M.D.   On: 12/29/2018 13:35    Procedures Procedures (including critical care time)  Medications Ordered in ED Medications  sodium chloride flush (NS) 0.9 % injection 3 mL (0 mLs Intravenous Hold 12/29/18 1523)  sodium chloride 0.9 % bolus 1,000 mL (0 mLs Intravenous Stopped 12/29/18 1450)  ketorolac (TORADOL) 30 MG/ML injection 30 mg (30 mg Intravenous Given 12/29/18 1338)  acetaminophen (TYLENOL) tablet 1,000 mg (1,000 mg Oral Given 12/29/18 1449)  iohexol (OMNIPAQUE) 350 MG/ML injection 100 mL (100 mLs Intravenous Contrast Given 12/29/18 1510)     Initial Impression / Assessment and Plan / ED Course  I have reviewed the triage vital signs and the nursing notes.  Pertinent labs & imaging results that were available during my care of the patient were reviewed by me and considered in my medical decision making (see chart for details).        23 year old presents with what sounds like atypical chest pain.  Symptoms described as sharp, constant since Saturday, positional, pleuritic.  Chest pain began immediately prior to several episodes of emesis and diarrhea, persisted despite resolution of the symptoms.No associated concerning features such as fever, cough, lightheadedness.  No cardiac risk factors.  No h/o HTN, hypercholesterolemia, DM, obesity, smoking,  positive family hx, known CAD.    She has reproducible parasternal chest wall tenderness.  Pain is reproduced with active range of motion of upper extremities and position changes, breathing.  CV and pulmonary exam benign. No signs to suggest DVT.  No tachycardia,  tachypnea, hypoxemia.   Will obtain labs, EKG, trop and CXR.  [facility] given use of hormonal patches for birth control. Low suspicion for PE, will add d-dimer.   Final Clinical Impressions(s) / ED Diagnoses   1522: Labs reassuring except d-dimer 0.91.  EKG w/o ischemia, early signs of pericarditis or PE. Trop negative, CP has been constant since Saturday and I doubt ACS, delta trop not indicated. HEART score < 3. CXR negative.  No leukocytosis.  Given elevated d-dimer, will obtain CTA to r/u PE.  I have low suspicion for this. Suspect MSK vs early pericarditis.  Will hand pt off to oncoming EDPA who will f/u on CTA. Ancitipate discharge with high dose NSAIDs. Discussed POC with patient and she is comfortable with this.  Final diagnoses:  Atypical chest pain  Chest wall pain    ED Discharge Orders    None       Jerrell MylarGibbons, Mylea Roarty J, PA-C 12/29/18 1526    Cathren LaineSteinl, Kevin, MD 12/31/18 [phone]

## 2018-12-29 NOTE — Discharge Instructions (Addendum)
You were evaluated in the emergency department for chest pain.    Work up was normal.   I suspect your pain is from muscular inflammation or inflammation of the lining of your lungs.   Take 600 mg ibuprofen every 6 hours for the next 5 days. Can add [phone] mg acetaminophen every 6 hours for more pain control.   Based on your risk factors, work up and exam you are considered low risk for major adverse cardiac events in the next 30 days.  This means you can be discharged with close follow up with primary care follow up further outpatient work up if your symptoms do not improve   Call your primary care doctor as soon as possible to establish care and further discussion and work up of your symptoms on an outpatient setting  Please return to ED if: Your chest pain is worse or on exertion You have a cough that gets worse, or you cough up blood. You have severe pain in chest, back or abdomen. You have chest pain or shortness of breath with exertion or activity You have sudden, unexplained discomfort in your chest, with radiation arms, back, neck, or jaw. You suddenly have chest pain and begin to sweat, or your skin gets clammy. You feel chest pain with nausea or vomiting. You suddenly feel light-headed or faint. Your heart begins to beat quickly, or it feels like it is skipping beats. You have one sided leg swelling or calf pain

## 2018-12-29 NOTE — TOC Transition Note (Signed)
Transition of Care [facility]) - CM/SW Discharge Note   Patient Details  Name: Leah [name] MRN: [id number] Date of Birth: 1996-03-18  Transition of Care [facility] CM/SW Contact:  Erenest Rasher, RN Phone Number: [phone] 12/29/2018, 3:10 PM   Clinical Narrative:  Referral PCP/3 ED visits in past 6 months  Spoke to pt and states she is currently without a PCP. Educated the importance of PCP for follow up post ED visit. Appt arranged on 01/07/2019 at 1:00 pm at [facility].   Final next level of care: Home/Self Care Barriers to Discharge: No Barriers Identified

## 2018-12-31 ENCOUNTER — Encounter (HOSPITAL_COMMUNITY): Payer: Self-pay

## 2018-12-31 ENCOUNTER — Inpatient Hospital Stay (HOSPITAL_COMMUNITY)
Admission: RE | Admit: 2018-12-31 | Discharge: 2019-01-04 | DRG: 885 | Disposition: A | Discharge status: Home / Self Care | Payer: Medicaid Other | Source: Intra-hospital | Attending: Psychiatry | Admitting: Psychiatry

## 2018-12-31 ENCOUNTER — Other Ambulatory Visit: Payer: Self-pay

## 2018-12-31 ENCOUNTER — Inpatient Hospital Stay (HOSPITAL_COMMUNITY): Admission: AD | Admit: 2018-12-31 | Payer: Medicaid Other | Source: Intra-hospital | Admitting: Psychiatry

## 2018-12-31 DIAGNOSIS — Z825 Family history of asthma and other chronic lower respiratory diseases: Secondary | ICD-10-CM

## 2018-12-31 DIAGNOSIS — Z8261 Family history of arthritis: Secondary | ICD-10-CM

## 2018-12-31 DIAGNOSIS — Z915 Personal history of self-harm: Secondary | ICD-10-CM

## 2018-12-31 DIAGNOSIS — R45851 Suicidal ideations: Secondary | ICD-10-CM | POA: Diagnosis present

## 2018-12-31 DIAGNOSIS — F332 Major depressive disorder, recurrent severe without psychotic features: Secondary | ICD-10-CM | POA: Diagnosis not present

## 2018-12-31 DIAGNOSIS — F3189 Other bipolar disorder: Secondary | ICD-10-CM | POA: Diagnosis not present

## 2018-12-31 LAB — LIPID PANEL
Cholesterol: 159 mg/dL (ref 0–200)
HDL: 57 mg/dL (ref >40)
LDL Cholesterol: 96 mg/dL (ref 0–99)
Total CHOL/HDL Ratio: 2.8 RATIO
Triglycerides: 31 mg/dL (ref <150)
VLDL: 6 mg/dL (ref 0–40)

## 2018-12-31 LAB — TSH: TSH: 0.94 u[IU]/mL (ref 0.350–4.500)

## 2018-12-31 LAB — HEMOGLOBIN A1C
Hgb A1c MFr Bld: 5.3 % (ref 4.8–5.6)
Mean Plasma Glucose: 105.41 mg/dL

## 2018-12-31 MED ORDER — TRAZODONE HCL 50 MG PO TABS
50.0000 mg | ORAL_TABLET | Freq: Every evening | ORAL | Status: DC | PRN
  Filled 2018-12-31: qty 1

## 2018-12-31 MED ORDER — ACETAMINOPHEN 325 MG PO TABS
650.0000 mg | ORAL_TABLET | Freq: Four times a day (QID) | ORAL | Status: DC | PRN
  Administered 2019-01-01: 650 mg via ORAL
  Filled 2018-12-31: qty 2

## 2018-12-31 MED ORDER — FLUOXETINE HCL 20 MG PO CAPS
20.0000 mg | ORAL_CAPSULE | Freq: Every day | ORAL | Status: DC
  Administered 2018-12-31 – 2019-01-01 (×2): 20 mg via ORAL
  Filled 2018-12-31 (×4): qty 1

## 2018-12-31 MED ORDER — HYDROXYZINE HCL 25 MG PO TABS: 25.0000 mg | ORAL_TABLET | Freq: Three times a day (TID) | ORAL | Status: DC | PRN

## 2018-12-31 MED ORDER — MAGNESIUM HYDROXIDE 400 MG/5ML PO SUSP: 30.0000 mL | Freq: Every day | ORAL | Status: DC | PRN

## 2018-12-31 MED ORDER — HYDROXYZINE HCL 25 MG PO TABS
25.0000 mg | ORAL_TABLET | Freq: Three times a day (TID) | ORAL | Status: DC | PRN
  Administered 2019-01-03: 25 mg via ORAL
  Filled 2018-12-31: qty 1

## 2018-12-31 MED ORDER — TEMAZEPAM 15 MG PO CAPS: 15.0000 mg | ORAL_CAPSULE | Freq: Every evening | ORAL | Status: DC | PRN

## 2018-12-31 MED ORDER — MODAFINIL 200 MG PO TABS
100.0000 mg | ORAL_TABLET | Freq: Every day | ORAL | Status: DC
  Administered 2019-01-01: 09:00:00 100 mg via ORAL
  Filled 2018-12-31: qty 1

## 2018-12-31 MED ORDER — ALUM & MAG HYDROXIDE-SIMETH 200-200-20 MG/5ML PO SUSP: 30.0000 mL | ORAL | Status: DC | PRN

## 2018-12-31 NOTE — Progress Notes (Signed)
Leah [name] is an 23 y.o. female who presented VOL with [employer] to [facility] for suicidal thoughts. Pt appears to be guarded during admission process. Pt was a poor historian. Pt states she felt sleepy. Pt denies SI/HI/AVH to Probation officer. Per chart hx, Pt was admitted to [facility] in May 2017 after an overdose. Pt denies drug/alacohol use. Skin was assessed and found to be clear of any abnormal marks apart from old scratches to upper back. Pt searched and no contraband found, POC and unit policies explained and understanding verbalized. Pt change into paper scrubs and wanded by security.Consents obtained.Food and fluids offered, and fluids accepted. Pt had no additional questions or concerns. Belongings in locker #55.

## 2018-12-31 NOTE — Progress Notes (Signed)
[place] NOVEL CORONAVIRUS (COVID-19) DAILY CHECK-OFF SYMPTOMS - answer yes or no to each - every day NO YES  Have you had a fever in the past 24 hours?  . Fever (Temp > 37.80C / 100F) X   Have you had any of these symptoms in the past 24 hours? . New Cough .  Sore Throat  .  Shortness of Breath .  Difficulty Breathing .  Unexplained Body Aches   X   Have you had any one of these symptoms in the past 24 hours not related to allergies?   . Runny Nose .  Nasal Congestion .  Sneezing   X   If you have had runny nose, nasal congestion, sneezing in the past 24 hours, has it worsened?  X   EXPOSURES - check yes or no X   Have you traveled outside the state in the past 14 days?  X   Have you been in contact with someone with a confirmed diagnosis of COVID-19 or PUI in the past 14 days without wearing appropriate PPE?  X   Have you been living in the same home as a person with confirmed diagnosis of COVID-19 or a PUI (household contact)?    X   Have you been diagnosed with COVID-19?    X              What to do next: Answered NO to all: Answered YES to anything:   Proceed with unit schedule Follow the [facility] Inpatient Flowsheet.   

## 2018-12-31 NOTE — Progress Notes (Signed)
Pt transferred to Adult unit 300 hall.
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
## 2018-12-31 NOTE — H&P (Signed)
[place] Screening Exam  Leah [name] is an 23 y.o. female who presented to [facility] with [employer] due to suicidal thoughts. Patient is alert, withdrawn, oriented x 4, speech is slow and low in volume. She is tearful throughout assessment.  Endorses suicidal thoughts.She denies a specific plan. States that she has attempted suicide multiple times in the past. Per chart review she was admitted to [facility] in May 2017 after an overdose. She reports that she has had admissions to other facilities, but did not elaborate and could not remember the dates. She denies that she had any admission within the past year. Denies HI and AVH. Reports occasional use of alcohol and marijuana., denies recent use.   Patient was evaluated for chest pain, nausea, and vomting in the emergency department on 12/29/18 and discharged.  States that is prescribed remeron, risperdal, and lamictal but she has not taken any medications in several months.   Total Time spent with patient: 20 minutes  Psychiatric Specialty Exam: Physical Exam  Constitutional: She is oriented to person, place, and time. She appears well-developed and well-nourished. No distress.  HENT:  Head: Normocephalic and atraumatic.  Right Ear: External ear normal.  Left Ear: External ear normal.  Eyes: Pupils are equal, round, and reactive to light. Right eye exhibits no discharge. Left eye exhibits no discharge. No scleral icterus.  Respiratory: Effort normal. No respiratory distress.  Musculoskeletal: Normal range of motion.  Neurological: She is alert and oriented to person, place, and time.  Skin: She is not diaphoretic.  Psychiatric: Her mood appears anxious. Her speech is delayed. She is slowed and withdrawn. She is not actively hallucinating. Thought content is not paranoid and not delusional. She exhibits a depressed mood. She expresses suicidal ideation. She expresses no homicidal ideation.    Review of Systems  Constitutional: Negative for  chills, diaphoresis, fever, malaise/fatigue and weight loss.  Respiratory: Negative for cough and shortness of breath.   Cardiovascular: Negative for chest pain and palpitations.  Gastrointestinal: Negative for diarrhea, nausea and vomiting.  Psychiatric/Behavioral: Positive for depression, substance abuse and suicidal ideas. Negative for hallucinations and memory loss. The patient is nervous/anxious and has insomnia.     Last menstrual period 12/07/2018.There is no height or weight on file to calculate BMI.  General Appearance: Casual and Guarded  Eye Contact:  Poor  Speech:  Slow  Volume:  Decreased  Mood:  Anxious, Depressed, Hopeless and Worthless  Affect:  Congruent, Depressed and Tearful  Thought Process:  Coherent and Descriptions of Associations: Intact  Orientation:  Full (Time, Place, and Person)  Thought Content:  Logical  Suicidal Thoughts:  Yes.  without intent/plan  Homicidal Thoughts:  No  Memory:  Immediate;   Fair Recent;   Fair  Judgement:  Impaired  Insight:  Lacking  Psychomotor Activity:  Decreased  Concentration: Concentration: Fair and Attention Span: Fair  Recall:  [employer] of Knowledge:Good  Language: Good  Akathisia:  Negative  Handed:  Right  AIMS (if indicated):     Assets:  Desire for Improvement Housing Intimacy Leisure Time Physical Health  Sleep:       Musculoskeletal: Strength & Muscle Tone: within normal limits Gait & Station: normal   Last menstrual period 12/07/2018.  Recommendations:  Based on my evaluation the patient does not appear to have an emergency medical condition.   Recommend inpatient treatment. Patient has been accepted to [facility].  Rozetta Nunnery, NP 12/31/2018, 3:41 AM

## 2018-12-31 NOTE — Progress Notes (Signed)
Patient ID: Leah [name], female   DOB: [birth year], 23 y.o.   MRN: [id number]  [place] NOVEL CORONAVIRUS (COVID-19) DAILY CHECK-OFF SYMPTOMS - answer yes or no to each - every day NO YES  Have you had a fever in the past 24 hours?  . Fever (Temp > 37.80C / 100F) X   Have you had any of these symptoms in the past 24 hours? . New Cough .  Sore Throat  .  Shortness of Breath .  Difficulty Breathing .  Unexplained Body Aches   X   Have you had any one of these symptoms in the past 24 hours not related to allergies?   . Runny Nose .  Nasal Congestion .  Sneezing   X   If you have had runny nose, nasal congestion, sneezing in the past 24 hours, has it worsened?  X   EXPOSURES - check yes or no X   Have you traveled outside the state in the past 14 days?  X   Have you been in contact with someone with a confirmed diagnosis of COVID-19 or PUI in the past 14 days without wearing appropriate PPE?  X   Have you been living in the same home as a person with confirmed diagnosis of COVID-19 or a PUI (household contact)?    X   Have you been diagnosed with COVID-19?    X              What to do next: Answered NO to all: Answered YES to anything:   Proceed with unit schedule Follow the [facility] Inpatient Flowsheet.

## 2018-12-31 NOTE — Tx Team (Signed)
Initial Treatment Plan 12/31/2018 3:19 PM [place] LEYLANIE WOODMANSEE SEG:[id number]    PATIENT STRESSORS: Financial difficulties Medication change or noncompliance Occupational concerns   PATIENT STRENGTHS: Ability for insight Motivation for treatment/growth Supportive family/friends   PATIENT IDENTIFIED PROBLEMS: "Depression"  "Anxiety"  "Crying spells"  Self harm thoughts               DISCHARGE CRITERIA:  Ability to meet basic life and health needs Improved stabilization in mood, thinking, and/or behavior Verbal commitment to aftercare and medication compliance  PRELIMINARY DISCHARGE PLAN: Outpatient therapy Return to previous living arrangement  PATIENT/FAMILY INVOLVEMENT: This treatment plan has been presented to and reviewed with the patient, [name] SMRITHI PIGFORD, and/or family member.  The patient and family have been given the opportunity to ask questions and make suggestions.  Harriet Masson, RN 12/31/2018, 3:19 PM

## 2018-12-31 NOTE — Progress Notes (Signed)
D: Pt alert and oriented.Pt denies experiencing any pain, HI, or AVH at this time.   Pt endorse SI w/out a plan and states that she feels safe while here. Pt reports that she with report any changes to staff.  A: Support and encouragement provided. Frequent verbal contact made. Routine safety checks conducted q15 minutes.   R: Pt verbally contracts for safety at this time. Pt interacts well with staff on the unit minimally when interaction is initiated. Pt remains safe at this time. Will continue to monitor.

## 2018-12-31 NOTE — Progress Notes (Signed)
Patient ID: [place], female   DOB: [birth year], 23 y.o.   MRN: [id number]  D: Pt alert and oriented during [facility] admission process. Pt denies SI/HI, A/VH, and any pain. Pt is cooperative and speaks in a low, soft whisper-like voice. Pt has poor eye-contact during admission.  HPI "[name] RHAYNE CHATWIN is an 23 y.o. female.  Patient contacted police because she was feeling suicidal and unsafe at home.  Police brought patient to [facility] unaccompanied. Patient is quiet and has to be encouraged to speak up or repeat her answers.  Most of the time she looks down and shakes her head to respond. Patient did not feel safe at home.  She is suicidal but has no plan. She says she has had several suicide attempts on the past.  She has been feeling suicidal for the last 3 weeks. Patient denies any HI or A/V hallucinations. Patient says she last used marijuana 3 days ago but she does not use regularly.  She drinks ETOH rarely but last use was about a week ago."  A: Education, support, reassurance, and encouragement provided, q15 minute safety checks initiated. Pt's belongings in locker # 14.    R: Pt denies any concerns at this time, and verbally contracts for safety. Pt ambulating on the unit with no issues. Pt remains safe on and off the unit.

## 2018-12-31 NOTE — Plan of Care (Signed)
[name] Observation Crisis Plan  Reason for Crisis Plan:  Crisis Stabilization   Plan of Care:  Referral for Telepsychiatry/Psychiatric Consult  Family Support:      Current Living Environment:  Living Arrangements: Alone  Insurance:   Hospital Account    Name Acct ID Class Status Primary Coverage   Leah [name], Leah [name] [id number] [place]        Guarantor Account (for Hospital Account [id number])    Name Relation to Pt Service Area Active? Acct Type   Leah [name], [place] Self [facility] Yes Behavioral Health   Address Phone       [address] Leah [name], [place] [address] [phone])          Coverage Information (for Hospital Account [id number])    F/O Payor/Plan Precert #   [facility] MEDICAID/[place] MEDICAID    Subscriber Subscriber #   Leah [name], Leah [name] [id number]   Address Phone   [address] [place], [place] [address] [phone]      Legal Guardian:     Primary Care Provider:  Patient, No Pcp Per  Current Outpatient Providers:  Psychiatrist:  Name of Psychiatrist: None  Counselor/Therapist:  Name of Therapist: None  Compliant with Medications:  Yes  Additional Information:   Leah [name] 6/11/20203:53 AM

## 2018-12-31 NOTE — [facility] Suicide Risk Assessment (Signed)
[facility] Admission Suicide Risk Assessment   Nursing information obtained from:    Demographic factors:  Adolescent or young adult Current Mental Status:  NA Loss Factors:  NA Historical Factors:  NA Risk Reduction Factors:  Positive social support, Positive therapeutic relationship  Total Time spent with patient: 45 minutes Principal Problem:  Diagnosis:  Active Problems:   Severe recurrent major depression without psychotic features (HCC)  Subjective Data: Despite lack of information will assume patient is indeed depressed with suicidality and seek diagnostic clarity monitoring every 15 precautions  Continued Clinical Symptoms:  Alcohol Use Disorder Identification Test Final Score (AUDIT): 1 The "Alcohol Use Disorders Identification Test", Guidelines for Use in Primary Care, Second Edition.  World Pharmacologist [facility]). Score between 0-7:  no or low risk or alcohol related problems. Score between 8-15:  moderate risk of alcohol related problems. Score between 16-19:  high risk of alcohol related problems. Score 20 or above:  warrants further diagnostic evaluation for alcohol dependence and treatment. Musculoskeletal: Strength & Muscle Tone: within normal limits Gait & Station: normal Patient leans: N/A  Psychiatric Specialty Exam: Physical Exam denies any current symptomatology  ROS neurological denies head injury or trauma/denies seizures cardiac denies issues cardiovascular peripheral denies issues/GI GU denies issues  Blood pressure (!) 90/55, pulse 71, temperature 99 F (37.2 C), temperature source Oral, resp. rate 18, height 5' (1.524 m), weight 52.2 kg, last menstrual period 12/07/2018, SpO2 100 %.Body mass index is 22.46 kg/m.  General Appearance: Disheveled  Eye Contact:  Minimal  Speech:  Slow and Slurred  Volume:  Decreased  Mood:  Dysphoric  Affect:  Tearful  Thought Process:  Linear and Descriptions of Associations: Intact  Orientation:  Full (Time, Place, and  Person)  Thought Content:  Rumination  Suicidal Thoughts:  Yes.  without intent/plan  Homicidal Thoughts:  No  Memory:  Immediate;   Poor  Judgement:  Fair  Insight:  Fair  Psychomotor Activity: Decreased  Concentration:  Concentration: Fair  Recall:  [place] of Knowledge:  Fair  Language:  Paucity of content and vague minimally engaged but only whisper  Akathisia:  NA  Handed:  Right  AIMS (if indicated):     Assets:  Housing Physical Health  ADL's:  Intact  Cognition:  WNL  Sleep:       CLINICAL FACTORS:   Depression:   Anhedonia   COGNITIVE FEATURES THAT CONTRIBUTE TO RISK:  Loss of executive function    SUICIDE RISK:   Moderate:  Frequent suicidal ideation with limited intensity, and duration, some specificity in terms of plans, no associated intent, good self-control, limited dysphoria/symptomatology, some risk factors present, and identifiable protective factors, including available and accessible social support.  PLAN OF CARE: Admit for stabilization and diagnostic clarity  I certify that inpatient services furnished can reasonably be expected to improve the patient's condition.   Johnn Hai, MD 12/31/2018, 2:24 PM

## 2018-12-31 NOTE — Progress Notes (Signed)
[place] NOVEL CORONAVIRUS (COVID-19) DAILY CHECK-OFF SYMPTOMS - answer yes or no to each - every day NO YES  Have you had a fever in the past 24 hours?  . Fever (Temp > 37.80C / 100F) X   Have you had any of these symptoms in the past 24 hours? . New Cough .  Sore Throat  .  Shortness of Breath .  Difficulty Breathing .  Unexplained Body Aches   X   Have you had any one of these symptoms in the past 24 hours not related to allergies?   . Runny Nose .  Nasal Congestion .  Sneezing   X   If you have had runny nose, nasal congestion, sneezing in the past 24 hours, has it worsened?  X   EXPOSURES - check yes or no X   Have you traveled outside the state in the past 14 days?  X   Have you been in contact with someone with a confirmed diagnosis of COVID-19 or PUI in the past 14 days without wearing appropriate PPE?  X   Have you been living in the same home as a person with confirmed diagnosis of COVID-19 or a PUI (household contact)?    X   Have you been diagnosed with COVID-19?    X              What to do next: Answered NO to all: Answered YES to anything:   Proceed with unit schedule Follow the [facility] Inpatient Flowsheet.   

## 2018-12-31 NOTE — Progress Notes (Addendum)
UA cup given. Pt made aware. Pending sample.  

## 2018-12-31 NOTE — H&P (Signed)
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  She is an extremely vague historian however and she whispers through the interview and this is been consistent since her evaluation initially.  She simply will not speak up but she does whisper and give answers.  She makes poor eye contact.  Patient states she is stressed about "everything" and will not be more specific.  She states she has secured housing she has a child in the care of her mother, she denies recent drug use her drug screen is negative she reports using cannabis intermittently though.  She also states she has been diagnosed with type I bipolar disorder in the past and has been treated with lamotrigine and Risperdal.  But she denies ever having auditory or visual hallucinations ever having any paranoia and when asked if she has had manic episodes she is unsure even after they are described to her.  Again she is very vague and guarded but she states she has suicidal thoughts without a current plan and she can contract for safety here, can repeat back to me what that means. Denies current psychotic symptoms.  Denies thoughts of harming others/denies a history of postpartum depression  Associated Signs/Symptoms: Depression Symptoms:  psychomotor retardation, (Hypo) Manic Symptoms:  n/a Anxiety Symptoms:  ukn Psychotic Symptoms:  neg PTSD Symptoms: Denied past abuse but was so guarded it is unclear whether this is an accurate answer ukn Total  Time spent with patient: 45 minutes  Past Psychiatric History: Denies past hospitalizations cannot recall the name of her past providers and states she has been on Lamictal and Latuda but not lately cannot recall doses or other drugs so forth  Is the patient at risk to self? Yes.    Has the patient been a risk to self in the past 6 months? No.  Has the patient been a risk to self within the distant past? No.  Is the patient a risk to others? No.  Has the patient been a risk to others in the past 6 months? No.  Has the patient been a risk to others within the distant past? No.   Prior Inpatient Therapy: Prior Inpatient Therapy: Yes Prior Therapy Dates: 11/2015 Prior Therapy Facilty/Provider(s): [facility] Reason for Treatment: SI Prior Outpatient Therapy: Prior Outpatient Therapy: No Does patient have an ACCT team?: No Does patient have Intensive In-House Services?  : No Does patient have [facility] services? : No Does patient have P4CC services?: No  Alcohol Screening: 1. How often do you have a drink containing alcohol?: Monthly or less 2. How many drinks containing alcohol do you have on a typical day when you are drinking?: 1 or 2 3. How often do you have six or more drinks on one occasion?: Never AUDIT-C Score: 1 4. How often during the last year have you found that you were not able to stop drinking once you had started?: Never 5. How often during the last year have you failed to do what was normally expected from you becasue of drinking?: Never 6. How often during the last year  have you needed a first drink in the morning to get yourself going after a heavy drinking session?: Never 7. How often during the last year have you had a feeling of guilt of remorse after drinking?: Never 8. How often during the last year have you been unable to remember what happened the night before because you had been drinking?: Never 9. Have you or someone else been injured as a result of your drinking?:  No 10. Has a relative or friend or a doctor or another health worker been concerned about your drinking or suggested you cut down?: No Alcohol Use Disorder Identification Test Final Score (AUDIT): 1 Substance Abuse History in the last 12 months:  Yes.   Consequences of Substance Abuse: NA Previous Psychotropic Medications: Yes  Psychological Evaluations: No  Past Medical History:  Past Medical History:  Diagnosis Date  . Anemia 2013  . Anxiety 2011   no meds during pregnancy  . Bipolar 1 disorder (HCC)   . Chlamydia   . Gall stones   . GERD (gastroesophageal reflux disease)   . 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 Seizure [facility](HCC) 2011   pseudoseizures    Past Surgical History:  Procedure Laterality Date  . CHOLECYSTECTOMY N/A 09/28/2014   Procedure: LAPAROSCOPIC CHOLECYSTECTOMY;  Surgeon: Axel FillerArmando Ramirez, MD;  Location: [facility] OR;  Service: General;  Laterality: N/A;  . CHOLECYSTECTOMY    . DIRECT LARYNGOSCOPY N/A 04/24/2014   Procedure: DIRECT LARYNGOSCOPY;  Surgeon: Darletta MollSui W Teoh, MD;  [facility];  Service: ENT;  Laterality: N/A;  . FOREIGN BODY REMOVAL ESOPHAGEAL N/A 04/24/2014   Procedure: REMOVAL FOREIGN BODY ESOPHAGEAL;  Surgeon: Darletta MollSui W Teoh, MD;  [facility];  Service: ENT;  Laterality: N/A;  . WISDOM TOOTH EXTRACTION  2013   Family History:  Family History  Problem Relation Age of Onset  . Arthritis Mother   . Bronchitis Mother   . Asthma Mother   . Hearing loss Paternal Grandfather   . Diabetes Neg Hx   . Stomach cancer Neg Hx   . Colon cancer Neg Hx    Family Psychiatric  History: ukn to pt Tobacco Screening:   Social History:  Social History   Substance and Sexual Activity  Alcohol Use No  . Alcohol/week: 6.0 standard drinks  . Types: 6 Cans of beer per week     Social History   Substance and Sexual Activity  Drug Use Yes  . Types: Marijuana   Comment: occ.    Additional Social History: Marital status: Single    Pain  Medications: None Prescriptions: Rimeron, Risperdal, Lamictal Over the Counter: None History of alcohol / drug use?: Yes Name of Substance 1: Marijuana 1 - Age of First Use: 23 years of age 72 - Amount (size/oz): half a joint 1 - Frequency: <1x/Month 1 - Duration: off and on 1 - Last Use / Amount: 3 days ago Name of Substance 2: ETOH 2 - Age of First Use: 23 years of age 64 - Amount (size/oz): 3 glasses of liquor or beer 2 - Frequency: <1x/M 2 - Duration: off and on 2 - Last Use / Amount: last week                Allergies:   Allergies  Allergen Reactions  . Bactrim [Sulfamethoxazole-Trimethoprim] Rash   Lab Results:  Results for orders placed or performed during the hospital encounter of 12/31/18 (from the past 48 hour(s))  Hemoglobin A1c  Status: None   Collection Time: 12/31/18  6:58 AM  Result Value Ref Range   Hgb A1c MFr Bld 5.3 4.8 - 5.6 %    Comment: (NOTE) Pre diabetes:          5.7%-6.4% Diabetes:              >6.4% Glycemic control for   <7.0% adults with diabetes    Mean Plasma Glucose 105.41 mg/dL    Comment: Performed at [facility], [address] [place], [place] [address]  Lipid panel     Status: None   Collection Time: 12/31/18  6:58 AM  Result Value Ref Range   Cholesterol 159 0 - 200 mg/dL   Triglycerides 31 <604<150 mg/dL   HDL 57 >54>40 mg/dL   Total CHOL/HDL Ratio 2.8 RATIO   VLDL 6 0 - 40 mg/dL   LDL Cholesterol 96 0 - 99 mg/dL    Comment:        Total Cholesterol/HDL:CHD Risk Coronary Heart Disease Risk Table                     Men   Women  1/2 Average Risk   3.4   3.3  Average Risk       5.0   4.4  2 X Average Risk   9.6   7.1  3 X Average Risk  23.4   11.0        Use the calculated Patient Ratio above and the CHD Risk Table to determine the patient's CHD Risk.        ATP III CLASSIFICATION (LDL):  <100     mg/dL   Optimal  [id number]  mg/dL   Near or Above                    Optimal  130-159  mg/dL   Borderline  [id number]   mg/dL   High  >562>190     mg/dL   Very High Performed at [facility], [address] [place], [place] [address]   TSH     Status: None   Collection Time: 12/31/18  6:58 AM  Result Value Ref Range   TSH 0.940 0.350 - 4.500 uIU/mL    Comment: Performed by a 3rd Generation assay with a functional sensitivity of <=0.01 uIU/mL. Performed at [facility], [address] [place], [place] [address]     Blood Alcohol level:  Lab Results  Component Value Date   [facility]   ETH <5 12/07/2015    Metabolic Disorder Labs:  Lab Results  Component Value Date   HGBA1C 5.3 12/31/2018   MPG 105.41 12/31/2018   MPG 103 08/22/2016   Lab Results  Component Value Date   PROLACTIN 18.9 10/01/2017   PROLACTIN 6.4 11/18/2014   Lab Results  Component Value Date   CHOL 159 12/31/2018   TRIG 31 12/31/2018   HDL 57 12/31/2018   CHOLHDL 2.8 12/31/2018   VLDL 6 12/31/2018   LDLCALC 96 12/31/2018   LDLCALC 82 08/22/2016    Current Medications: Current Facility-Administered Medications  Medication Dose Route Frequency Provider Last Rate Last Dose  . acetaminophen (TYLENOL) tablet 650 mg  650 mg Oral Q6H PRN Nira ConnBerry, Jason A, NP      . alum & mag hydroxide-simeth (MAALOX/MYLANTA) 200-200-20 MG/5ML suspension 30 mL  30 mL Oral Q4H PRN Jackelyn PolingBerry, Jason A, NP      . FLUoxetine (PROZAC)  capsule 20 mg  20 mg Oral Daily Malvin JohnsFarah, Norman Bier, MD      . hydrOXYzine (ATARAX/VISTARIL) tablet 25 mg  25 mg Oral TID PRN Nira ConnBerry, Jason A, NP      . magnesium hydroxide (MILK OF MAGNESIA) suspension 30 mL  30 mL Oral Daily PRN Jackelyn PolingBerry, Jason A, NP      . Melene Muller[START ON 01/01/2019] modafinil (PROVIGIL) tablet 100 mg  100 mg Oral Daily Malvin JohnsFarah, Paula Busenbark, MD      . temazepam (RESTORIL) capsule 15 mg  15 mg Oral QHS PRN Malvin JohnsFarah, Kaliyah Gladman, MD      . traZODone (DESYREL) tablet 50 mg  50 mg Oral QHS PRN Jackelyn PolingBerry, Jason A, NP       PTA Medications: Medications Prior to Admission  Medication Sig Dispense  Refill Last Dose  . acetaminophen (TYLENOL 8 HOUR) 650 MG CR tablet Take 1 tablet (650 mg total) by mouth every 8 (eight) hours as needed. 30 tablet 0   . etonogestrel-ethinyl estradiol (NUVARING) 0.12-0.015 MG/24HR vaginal ring Insert vaginally and leave in place for 3 consecutive weeks, then remove for 1 week. (Patient not taking: Reported on 12/29/2018) 1 each 12   . MELATONIN PO Take 1 tablet by mouth at bedtime as needed (sleep).     . norelgestromin-ethinyl estradiol (ORTHO EVRA) 150-35 MCG/24HR transdermal patch Place 1 patch onto the skin once a week. 3 patch 12   . ondansetron (ZOFRAN ODT) 8 MG disintegrating tablet Take 1 tablet (8 mg total) by mouth every 8 (eight) hours as needed for nausea. (Patient not taking: Reported on 12/29/2018) 20 tablet 0   . phenazopyridine (PYRIDIUM) 200 MG tablet Take 1 tablet (200 mg total) by mouth 3 (three) times daily as needed for pain. (Patient not taking: Reported on 12/29/2018) 8 tablet 0     Musculoskeletal: Strength & Muscle Tone: within normal limits Gait & Station: normal Patient leans: N/A  Psychiatric Specialty Exam: Physical Exam denies any current symptomatology  ROS neurological denies head injury or trauma/denies seizures cardiac denies issues cardiovascular peripheral denies issues/GI GU denies issues  Blood pressure (!) 90/55, pulse 71, temperature 99 F (37.2 C), temperature source Oral, resp. rate 18, height 5' (1.524 m), weight 52.2 kg, last menstrual period 12/07/2018, SpO2 100 %.Body mass index is 22.46 kg/m.  General Appearance: Disheveled  Eye Contact:  Minimal  Speech:  Slow and Slurred  Volume:  Decreased  Mood:  Dysphoric  Affect:  Tearful  Thought Process:  Linear and Descriptions of Associations: Intact  Orientation:  Full (Time, Place, and Person)  Thought Content:  Rumination  Suicidal Thoughts:  Yes.  without intent/plan  Homicidal Thoughts:  No  Memory:  Immediate;   Poor  Judgement:  Fair  Insight:  Fair   Psychomotor Activity: Decreased  Concentration:  Concentration: Fair  Recall:  Fair  Fund of Knowledge:  Fair  Language:  Paucity of content and vague minimally engaged but only whisper  Akathisia:  NA  Handed:  Right  AIMS (if indicated):     Assets:  Housing Physical Health  ADL's:  Intact  Cognition:  WNL  Sleep:       Treatment Plan Summary:   15-minute checks patient can contract  Laboratory:  UDS  Psychotherapy: Cognitive-based  Medications: Several adjustments  Consultations: Defer  Discharge Concerns: Lack of full history  Estimated LOS: 5-7  Other:   Axis I-though positive information patient guarded minimally engaged in interview process and barely communicative we will go and diagnosed with  depression recurrent severe without psychosis, rule out bipolar type I by history intermittent cannabis abuse.   Physician Treatment Plan for Primary Diagnosis: <principal problem not specified> Long Term Goal(s): Improvement in symptoms so as ready for discharge  Short Term Goals: Ability to verbalize feelings will improve, Ability to disclose and discuss suicidal ideas, Ability to demonstrate self-control will improve and Ability to identify and develop effective coping behaviors will improve  Physician Treatment Plan for Secondary Diagnosis: Active Problems:   Severe recurrent major depression without psychotic features (HCC)  Long Term Goal(s): Improvement in symptoms so as ready for discharge  Short Term Goals: Ability to demonstrate self-control will improve, Ability to identify and develop effective coping behaviors will improve and Compliance with prescribed medications will improve  I certify that inpatient services furnished can reasonably be expected to improve the patient's condition.    Malvin Johns, MD 6/11/20202:17 PM

## 2019-01-01 LAB — RAPID URINE DRUG SCREEN, HOSP PERFORMED
Amphetamines: NOT DETECTED
Barbiturates: NOT DETECTED
Benzodiazepines: NOT DETECTED
Cocaine: NOT DETECTED
Opiates: NOT DETECTED
Tetrahydrocannabinol: NOT DETECTED

## 2019-01-01 LAB — PREGNANCY, URINE: Preg Test, Ur: NEGATIVE

## 2019-01-01 MED ORDER — LAMOTRIGINE 25 MG PO TABS
25.0000 mg | ORAL_TABLET | Freq: Every day | ORAL | Status: DC
  Administered 2019-01-01 – 2019-01-04 (×4): 25 mg via ORAL
  Filled 2019-01-01 (×7): qty 1

## 2019-01-01 MED ORDER — NICOTINE 21 MG/24HR TD PT24
21.0000 mg | MEDICATED_PATCH | Freq: Every day | TRANSDERMAL | Status: DC
  Filled 2019-01-01 (×4): qty 1

## 2019-01-01 MED ORDER — RISPERIDONE 1 MG PO TABS
1.0000 mg | ORAL_TABLET | Freq: Two times a day (BID) | ORAL | Status: DC
  Administered 2019-01-01 – 2019-01-02 (×2): 1 mg via ORAL
  Filled 2019-01-01 (×6): qty 1

## 2019-01-01 NOTE — Progress Notes (Signed)
[place] NOVEL CORONAVIRUS (COVID-19) DAILY CHECK-OFF SYMPTOMS - answer yes or no to each - every day NO YES  Have you had a fever in the past 24 hours?  . Fever (Temp > 37.80C / 100F) X   Have you had any of these symptoms in the past 24 hours? . New Cough .  Sore Throat  .  Shortness of Breath .  Difficulty Breathing .  Unexplained Body Aches   X   Have you had any one of these symptoms in the past 24 hours not related to allergies?   . Runny Nose .  Nasal Congestion .  Sneezing   X   If you have had runny nose, nasal congestion, sneezing in the past 24 hours, has it worsened?  X   EXPOSURES - check yes or no X   Have you traveled outside the state in the past 14 days?  X   Have you been in contact with someone with a confirmed diagnosis of COVID-19 or PUI in the past 14 days without wearing appropriate PPE?  X   Have you been living in the same home as a person with confirmed diagnosis of COVID-19 or a PUI (household contact)?    X   Have you been diagnosed with COVID-19?    X              What to do next: Answered NO to all: Answered YES to anything:   Proceed with unit schedule Follow the [facility] Inpatient Flowsheet.   

## 2019-01-01 NOTE — Progress Notes (Signed)
Patient refused to allow staff to obtain VS. Will attempt again in the AM.

## 2019-01-01 NOTE — Tx Team (Signed)
Interdisciplinary Treatment and Diagnostic Plan Update  01/01/2019 Time of Session: 9:23am Leah [name] MRN: [id number]  Principal Diagnosis: <principal problem not specified>  Secondary Diagnoses: Active Problems:   Severe recurrent major depression without psychotic features (HCC)   Current Medications:  Current Facility-Administered Medications  Medication Dose Route Frequency Provider Last Rate Last Dose  . acetaminophen (TYLENOL) tablet 650 mg  650 mg Oral Q6H PRN Lindon Romp A, NP   650 mg at 01/01/19 0641  . alum & mag hydroxide-simeth (MAALOX/MYLANTA) 200-200-20 MG/5ML suspension 30 mL  30 mL Oral Q4H PRN Lindon Romp A, NP      . FLUoxetine (PROZAC) capsule 20 mg  20 mg Oral Daily Johnn Hai, MD   20 mg at 01/01/19 0841  . hydrOXYzine (ATARAX/VISTARIL) tablet 25 mg  25 mg Oral TID PRN Lindon Romp A, NP      . magnesium hydroxide (MILK OF MAGNESIA) suspension 30 mL  30 mL Oral Daily PRN Lindon Romp A, NP      . modafinil (PROVIGIL) tablet 100 mg  100 mg Oral Daily Johnn Hai, MD   100 mg at 01/01/19 0845  . nicotine (NICODERM CQ - dosed in mg/24 hours) patch 21 mg  21 mg Transdermal Daily Cobos, Fernando A, MD      . temazepam (RESTORIL) capsule 15 mg  15 mg Oral QHS PRN Johnn Hai, MD      . traZODone (DESYREL) tablet 50 mg  50 mg Oral QHS PRN Rozetta Nunnery, NP       PTA Medications: Medications Prior to Admission  Medication Sig Dispense Refill Last Dose  . acetaminophen (TYLENOL 8 HOUR) 650 MG CR tablet Take 1 tablet (650 mg total) by mouth every 8 (eight) hours as needed. 30 tablet 0   . etonogestrel-ethinyl estradiol (NUVARING) 0.12-0.015 MG/24HR vaginal ring Insert vaginally and leave in place for 3 consecutive weeks, then remove for 1 week. (Patient not taking: Reported on 12/29/2018) 1 each 12   . MELATONIN PO Take 1 tablet by mouth at bedtime as needed (sleep).     . norelgestromin-ethinyl estradiol (ORTHO EVRA) 150-35 MCG/24HR transdermal patch Place 1 patch  onto the skin once a week. 3 patch 12   . ondansetron (ZOFRAN ODT) 8 MG disintegrating tablet Take 1 tablet (8 mg total) by mouth every 8 (eight) hours as needed for nausea. (Patient not taking: Reported on 12/29/2018) 20 tablet 0   . phenazopyridine (PYRIDIUM) 200 MG tablet Take 1 tablet (200 mg total) by mouth 3 (three) times daily as needed for pain. (Patient not taking: Reported on 12/29/2018) 8 tablet 0     Patient Stressors: Financial difficulties Medication change or noncompliance Occupational concerns  Patient Strengths: Ability for insight Motivation for treatment/growth Supportive family/friends  Treatment Modalities: Medication Management, Group therapy, Case management,  1 to 1 session with clinician, Psychoeducation, Recreational therapy.   Physician Treatment Plan for Primary Diagnosis: <principal problem not specified> Long Term Goal(s): Improvement in symptoms so as ready for discharge Improvement in symptoms so as ready for discharge   Short Term Goals: Ability to verbalize feelings will improve Ability to disclose and discuss suicidal ideas Ability to demonstrate self-control will improve Ability to identify and develop effective coping behaviors will improve Ability to demonstrate self-control will improve Ability to identify and develop effective coping behaviors will improve Compliance with prescribed medications will improve  Medication Management: Evaluate patient's response, side effects, and tolerance of medication regimen.  Therapeutic Interventions: 1 to 1 sessions, Unit  Group sessions and Medication administration.  Evaluation of Outcomes: Not Met  Physician Treatment Plan for Secondary Diagnosis: Active Problems:   Severe recurrent major depression without psychotic features ([place])  Long Term Goal(s): Improvement in symptoms so as ready for discharge Improvement in symptoms so as ready for discharge   Short Term Goals: Ability to verbalize feelings  will improve Ability to disclose and discuss suicidal ideas Ability to demonstrate self-control will improve Ability to identify and develop effective coping behaviors will improve Ability to demonstrate self-control will improve Ability to identify and develop effective coping behaviors will improve Compliance with prescribed medications will improve     Medication Management: Evaluate patient's response, side effects, and tolerance of medication regimen.  Therapeutic Interventions: 1 to 1 sessions, Unit Group sessions and Medication administration.  Evaluation of Outcomes: Not Met   RN Treatment Plan for Primary Diagnosis: <principal problem not specified> Long Term Goal(s): Knowledge of disease and therapeutic regimen to maintain health will improve  Short Term Goals: Ability to participate in decision making will improve, Ability to verbalize feelings will improve, Ability to disclose and discuss suicidal ideas, Ability to identify and develop effective coping behaviors will improve and Compliance with prescribed medications will improve  Medication Management: RN will administer medications as ordered by provider, will assess and evaluate patient's response and provide education to patient for prescribed medication. RN will report any adverse and/or side effects to prescribing provider.  Therapeutic Interventions: 1 on 1 counseling sessions, Psychoeducation, Medication administration, Evaluate responses to treatment, Monitor vital signs and CBGs as ordered, Perform/monitor CIWA, COWS, AIMS and Fall Risk screenings as ordered, Perform wound care treatments as ordered.  Evaluation of Outcomes: Not Met   LCSW Treatment Plan for Primary Diagnosis: <principal problem not specified> Long Term Goal(s): Safe transition to appropriate next level of care at discharge, Engage patient in therapeutic group addressing interpersonal concerns.  Short Term Goals: Engage patient in aftercare  planning with referrals and resources and Increase skills for wellness and recovery  Therapeutic Interventions: Assess for all discharge needs, 1 to 1 time with Social worker, Explore available resources and support systems, Assess for adequacy in community support network, Educate family and significant other(s) on suicide prevention, Complete Psychosocial Assessment, Interpersonal group therapy.  Evaluation of Outcomes: Not Met   Progress in Treatment: Attending groups: No. Participating in groups: No. Taking medication as prescribed: Yes. Toleration medication: Yes. Family/Significant other contact made: No, will contact:  will contact if given consent to contact Patient understands diagnosis: Yes. Discussing patient identified problems/goals with staff: Yes. Medical problems stabilized or resolved: Yes. Denies suicidal/homicidal ideation: Yes. Issues/concerns per patient self-inventory: No. Other:   New problem(s) identified: No, Describe:  None  New Short Term/Long Term Goal(s):  Patient Goals:  "Find another coping mechanism"  Discharge Plan or Barriers:   Reason for Continuation of Hospitalization: Anxiety Medication stabilization  Estimated Length of Stay: 2-3 days  Attendees: Patient: 01/01/2019   Physician: Dr. Neita Garnet, MD 01/01/2019  Nursing: Chong Sicilian, RN 01/01/2019   RN Care Manager: 01/01/2019   Social Worker: Ardelle Anton, LCSW 01/01/2019   Recreational Therapist:  01/01/2019   Other:  01/01/2019   Other:  01/01/2019   Other: 01/01/2019       Scribe for Treatment Team: Trecia Rogers, LCSW 01/01/2019 9:49 AM

## 2019-01-01 NOTE — Progress Notes (Signed)
Patient has slept since start of this writer's shift. RR WNL, even and unlabored. Level III obs in place for safety and patient remains safe.

## 2019-01-01 NOTE — Progress Notes (Signed)
Patient up at med window holding R arm as if she cannot move it (she can when asked). States she has sharp pain rated at a 10/10. Patient unable to state why she might be in pain or if she suffered an injury. Asked patient if she had blood drawn in that arm and patient shook her head yes. Patient's behavior and affect bizarre. Medicated with tylenol 650mg . Will ask day RN to reassess pain level and suggested heat pack upon return from breakfast.

## 2019-01-01 NOTE — Progress Notes (Signed)
Psychoeducational Group Note  Date:  01/01/2019 Time:  2235  Group Topic/Focus:  Wrap-Up Group:   The focus of this group is to help patients review their daily goal of treatment and discuss progress on daily workbooks.  Participation Level: Did Not Attend  Participation Quality:  Not Applicable  Affect:  Not Applicable  Cognitive:  Not Applicable  Insight:  Not Applicable  Engagement in Group: Not Applicable  Additional Comments:  The patient did not attend group since she was fast asleep in her bedroom.   Archie Balboa S 01/01/2019, 10:35 PM

## 2019-01-01 NOTE — Progress Notes (Signed)
D Pt is observed standing on the 300 hall this am. She avoids making eye contact with this Probation officer. SHe wears hospital-issue patient  Scrubs. Her hair is messed. Up. When she speaks, her voice is almost a whisper - like sound. It is inaudible. She has child=like manuerisms.     A She completed her daily assessment and on this she wrote she deneid SI today and she rated her depression, hopelessness and anxeity " 2/0/0/", respectively. She is encouraged to attend groups .     R Safety in place.

## 2019-01-01 NOTE — Progress Notes (Addendum)
[facility] MD Progress Note  01/01/2019 11:09 AM Leah [name]  MRN:  [id number] Subjective:  "I had an episode."  Leah [name] found talking on the phone. She continues to provide vague answers on assessment. When asked about current mood, she states "I don't know." Prior notes indicate she had called the police prior to admission reporting suicidal ideation. Today she denies SI and only states, "I had an episode" when asked what brought her to the hospital. She is unable to say if she was depressed or what caused the episode. She denies drug or alcohol abuse. UDS and urine pregnancy pending. She denies pregnancy. She does endorse episodes of crying and tearfulness. Affect is incongruent at times, and she smiles while talking about crying. 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 Chart review shows a history of overdosing on OTC medications. She reports being previously diagnosed with bipolar disorder. She responds "yes" when asked about a history of periods with increased energy, impulsivity and decreased sleep. She also reports intermittent auditory hallucinations for years, including command auditory hallucinations to hurt herself and others. Denies AH at this time, states AH last occurred on the day she called police.She reports taking Risperdal and Lamictal in the past and reports they were well-tolerated. She has been off medications for months and agrees to restart them. No agitated or disruptive behaviors on the unit. She reports difficulty sleeping last night.  From admission H&P: Patient reports this is her first admission she is a 23 year old single patient who phoned the police herself endorsing suicidal thoughts and knowing she needed help.  She is an extremely vague historian however and she whispers through the interview and this is been consistent since her evaluation initially. Patient states she is stressed about "everything" and will not be more  specific.  She states she has secured housing she has a child in the care of her mother  Principal Problem: <principal problem not specified> Diagnosis: Active Problems:   Severe recurrent major depression without psychotic features ([place])  Total Time spent with patient: 20 minutes  Past Psychiatric History: See admission H&P  Past Medical History:  Past Medical History:  Diagnosis Date  . Anemia 2013  . Anxiety 2011   no meds during pregnancy  . Bipolar 1 disorder ([place])   . Chlamydia   . Gall stones   . GERD (gastroesophageal reflux disease)   . 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 Seizure [facility]) 2011   pseudoseizures    Past Surgical History:  Procedure Laterality Date  . CHOLECYSTECTOMY N/A 09/28/2014   Procedure: LAPAROSCOPIC CHOLECYSTECTOMY;  Surgeon: Ralene Ok, MD;  Location: [place];  Service: General;  Laterality: N/A;  . CHOLECYSTECTOMY    . DIRECT LARYNGOSCOPY N/A 04/24/2014   Procedure: DIRECT LARYNGOSCOPY;  Surgeon: Ascencion Dike, MD;  Location: [place];  Service: ENT;  Laterality: N/A;  . FOREIGN BODY REMOVAL ESOPHAGEAL N/A 04/24/2014   Procedure: REMOVAL FOREIGN BODY ESOPHAGEAL;  Surgeon: Ascencion Dike, MD;  Location: [facility] OR;  Service: ENT;  Laterality: N/A;  . WISDOM TOOTH EXTRACTION  2013   Family History:  Family History  Problem Relation Age of Onset  . Arthritis Mother   . Bronchitis Mother   . Asthma Mother   . Hearing loss Paternal Grandfather   . Diabetes Neg Hx   . Stomach cancer Neg Hx   . Colon cancer  Neg Hx    Family Psychiatric  History: See admission H&P Social History:  Social History   Substance and Sexual Activity  Alcohol Use No  . Alcohol/week: 6.0 standard drinks  . Types: 6 Cans of beer per week     Social History   Substance and Sexual Activity  Drug Use Yes  . Types: Marijuana   Comment: occ.    Social History   Socioeconomic History  . Marital status: Single    Spouse name: Not on file  . Number of  children: 1  . Years of education: 4012 th   . Highest education level: Not on file  Occupational History  . Occupation: Conservation officer, [employer]  Social Needs  . Financial resource strain: Not on file  . Food insecurity    Worry: Never true    Inability: Never true  . Transportation needs    Medical: No    Non-medical: No  Tobacco Use  . Smoking status: Light Tobacco Smoker  . Smokeless tobacco: Never Used  . Tobacco comment: marjiuna  Substance and Sexual Activity  . Alcohol use: No    Alcohol/week: 6.0 standard drinks    Types: 6 Cans of beer per week  . Drug use: Yes    Types: Marijuana    Comment: occ.  Marland Kitchen. Sexual activity: Yes    Partners: Male    Birth control/protection: None  Lifestyle  . Physical activity    Days per week: Not on file    Minutes per session: Not on file  . Stress: Not on file  Relationships  . Social Musicianconnections    Talks on phone: Not on file    Gets together: Not on file    Attends religious service: Not on file    Active member of club or organization: Not on file    Attends meetings of clubs or organizations: Not on file    Relationship status: Not on file  Other Topics Concern  . Not on file  Social History Narrative   ** Merged History Encounter **       Live with roommate and son.    Starting at [employer].    Additional Social History:    Pain Medications: None Prescriptions: Rimeron, Risperdal, Lamictal Over the Counter: None History of alcohol / drug use?: Yes Name of Substance 1: Marijuana 1 - Age of First Use: 23 years of age 58 - Amount (size/oz): half a joint 1 - Frequency: <1x/Month 1 - Duration: off and on 1 - Last Use / Amount: 3 days ago Name of Substance 2: ETOH 2 - Age of First Use: 23 years of age 66 - Amount (size/oz): 3 glasses of liquor or beer 2 - Frequency: <1x/M 2 - Duration: off and on 2 - Last Use / Amount: last week                Sleep: Fair  Appetite:  Fair  Current Medications: Current  Facility-Administered Medications  Medication Dose Route Frequency Provider Last Rate Last Dose  . acetaminophen (TYLENOL) tablet 650 mg  650 mg Oral Q6H PRN Nira ConnBerry, Jason A, NP   650 mg at 01/01/19 0641  . alum & mag hydroxide-simeth (MAALOX/MYLANTA) 200-200-20 MG/5ML suspension 30 mL  30 mL Oral Q4H PRN Nira ConnBerry, Jason A, NP      . FLUoxetine (PROZAC) capsule 20 mg  20 mg Oral Daily Malvin JohnsFarah, Brian, MD   20 mg at 01/01/19 0841  . hydrOXYzine (ATARAX/VISTARIL) tablet 25 mg  25 mg Oral TID PRN Nira ConnBerry, Jason A, NP      . magnesium hydroxide (MILK OF MAGNESIA) suspension 30 mL  30 mL Oral Daily PRN Nira ConnBerry, Jason A, NP      . modafinil (PROVIGIL) tablet 100 mg  100 mg Oral Daily Malvin JohnsFarah, Brian, MD   100 mg at 01/01/19 0845  . nicotine (NICODERM CQ - dosed in mg/24 hours) patch 21 mg  21 mg Transdermal Daily Emilyn Ruble A, MD      . temazepam (RESTORIL) capsule 15 mg  15 mg Oral QHS PRN Malvin JohnsFarah, Brian, MD      . traZODone (DESYREL) tablet 50 mg  50 mg Oral QHS PRN Jackelyn PolingBerry, Jason A, NP        Lab Results:  Results for orders placed or performed during the hospital encounter of 12/31/18 (from the past 48 hour(s))  Hemoglobin A1c     Status: None   Collection Time: 12/31/18  6:58 AM  Result Value Ref Range   Hgb A1c MFr Bld 5.3 4.8 - 5.6 %    Comment: (NOTE) Pre diabetes:          5.7%-6.4% Diabetes:              >6.4% Glycemic control for   <7.0% adults with diabetes    Mean Plasma Glucose 105.41 mg/dL    Comment: Performed at [facility], [address] [place], [place] [address]  Lipid panel     Status: None   Collection Time: 12/31/18  6:58 AM  Result Value Ref Range   Cholesterol 159 0 - 200 mg/dL   Triglycerides 31 <604<150 mg/dL   HDL 57 >54>40 mg/dL   Total CHOL/HDL Ratio 2.8 RATIO   VLDL 6 0 - 40 mg/dL   LDL Cholesterol 96 0 - 99 mg/dL    Comment:        Total Cholesterol/HDL:CHD Risk Coronary Heart Disease Risk Table                     Men   Women  1/2 Average Risk   3.4   3.3   Average Risk       5.0   4.4  2 X Average Risk   9.6   7.1  3 X Average Risk  23.4   11.0        Use the calculated Patient Ratio above and the CHD Risk Table to determine the patient's CHD Risk.        ATP III CLASSIFICATION (LDL):  <100     mg/dL   Optimal  [id number]  mg/dL   Near or Above                    Optimal  130-159  mg/dL   Borderline  [id number]  mg/dL   High  >562>190     mg/dL   Very High Performed at [facility], [address] [place], [place] [address]   TSH     Status: None   Collection Time: 12/31/18  6:58 AM  Result Value Ref Range   TSH 0.940 0.350 - 4.500 uIU/mL    Comment: Performed by a 3rd Generation assay with a functional sensitivity of <=0.01 uIU/mL. Performed at [facility], [address] [place], [place] [address]     Blood Alcohol level:  Lab Results  Component Value Date   [facility] <5 08/19/2016   ETH <5 12/07/2015  Metabolic Disorder Labs: Lab Results  Component Value Date   HGBA1C 5.3 12/31/2018   MPG 105.41 12/31/2018   MPG 103 08/22/2016   Lab Results  Component Value Date   PROLACTIN 18.9 10/01/2017   PROLACTIN 6.4 11/18/2014   Lab Results  Component Value Date   CHOL 159 12/31/2018   TRIG 31 12/31/2018   HDL 57 12/31/2018   CHOLHDL 2.8 12/31/2018   VLDL 6 12/31/2018   LDLCALC 96 12/31/2018   LDLCALC 82 08/22/2016    Physical Findings: AIMS:  , ,  ,  ,    CIWA:  CIWA-Ar Total: 1 COWS:     Musculoskeletal: Strength & Muscle Tone: within normal limits Gait & Station: normal Patient leans: N/A  Psychiatric Specialty Exam: Physical Exam  Nursing note and vitals reviewed. Constitutional: She is oriented to person, place, and time. She appears well-developed and well-nourished.  Cardiovascular: Normal rate.  Respiratory: Effort normal.  Neurological: She is alert and oriented to person, place, and time.    Review of Systems  Constitutional: Negative.   Respiratory: Negative for  cough and shortness of breath.   Cardiovascular: Negative for chest pain.  Gastrointestinal: Negative for diarrhea, nausea and vomiting.  Neurological: Negative for headaches.  Psychiatric/Behavioral: Positive for hallucinations. Negative for depression, substance abuse and suicidal ideas. The patient is not nervous/anxious and does not have insomnia.     Blood pressure 124/69, pulse 70, temperature 99 F (37.2 C), temperature source Oral, resp. rate 18, height 5' (1.524 m), weight 52.2 kg, last menstrual period 12/07/2018, SpO2 100 %.Body mass index is 22.46 kg/m.  General Appearance: Casual  Eye Contact:  Minimal  Speech:  Slow  Volume:  Decreased  Mood:  Anxious  Affect:  Non-Congruent and Constricted  Thought Process:  Coherent  Orientation:  Full (Time, Place, and Person)  Thought Content:  Hallucinations: Auditory  Suicidal Thoughts:  Denies  Homicidal Thoughts:  Denies  Memory:  Immediate;   Fair Recent;   Fair  Judgement:  Fair  Insight:  Lacking  Psychomotor Activity:  Normal  Concentration:  Concentration: Fair  Recall:  [employer]  Fund of Knowledge:  Fair  Language:  Fair  Akathisia:  No  Handed:  Right  AIMS (if indicated):     Assets:  Manufacturing systems engineerCommunication Skills Housing Physical Health  ADL's:  Intact  Cognition:  WNL  Sleep:  Number of Hours: 6.25     Treatment Plan Summary: Daily contact with patient to assess and evaluate symptoms and progress in treatment and Medication management   Continue inpatient hospitalization.  Start Risperdal 1 mg PO BID for mood/AH Start Lamictal 25 mg PO daily for mood Discontinue modafinil, Restoril Start trazodone 50 mg PO QHS PRN insomnia Continue Vistaril 25 mg PO TID PRN anxiety  Patient will participate in the therapeutic group milieu.  Discharge disposition in progress.   Aldean BakerJanet E Sykes, NP 01/01/2019, 11:09 AM   Agree with NP progress note

## 2019-01-02 DIAGNOSIS — F3189 Other bipolar disorder: Secondary | ICD-10-CM

## 2019-01-02 NOTE — [facility] Group Notes (Signed)
LCSW Group Therapy Note  01/02/2019   10:00-11:00am   Type of Therapy and Topic:  Group Therapy: Anger Cues and Responses  Participation Level:  Active   Description of Group:   In this group, patients learned how to recognize the physical, cognitive, emotional, and behavioral responses they have to anger-provoking situations.  They identified a recent time they became angry and how they reacted.  They analyzed how their reaction was possibly beneficial and how it was possibly unhelpful.  The group discussed a variety of healthier coping skills that could help with such a situation in the future.  Deep breathing was practiced briefly.  Therapeutic Goals: 1. Patients will remember their last incident of anger and how they felt emotionally and physically, what their thoughts were at the time, and how they behaved. 2. Patients will identify how their behavior at that time worked for them, as well as how it worked against them. 3. Patients will explore possible new behaviors to use in future anger situations. 4. Patients will learn that anger itself is normal and cannot be eliminated, and that healthier reactions can assist with resolving conflict rather than worsening situations.  Summary of Patient Progress:  The patient shared that h most recent time of anger involved another patient hanging up her phone call before she was done. The peer hung the phone up when she went to take her medication. The patient described being irritated by this rudeness. She stated that she pulled a chair from under the peer's leg and the peer went to her room with no further conflict. The patient I aware that there other ways to handle situations like this and was thankful it did not escalate. She is aware that anger I natural/normal and that there are physical cues that accompany anger.  Therapeutic Modalities:   Cognitive Behavioral Therapy  Rolanda Jay

## 2019-01-02 NOTE — Progress Notes (Addendum)
D Pt is observed OOB UAL on the 300 hall this morning. She remians child-like in her speech and her behaviors- she acts shy, she speaks to this Probation officer in an almost inaudible whisper-like voice and will not speak louder, despite encouragement to do so. SHe wears her own clothes, she is young in her behavior.     A SHe completed her daily assessment and on this she wrote she denied having SI today and she rated her depression, hopelessness and anxiety " 0/0/0/", respectively. She complained of medication ( resperidone) causing " me to sleep too much". Her affect / behaviors can be in congruent. An example of this is when she speaks to this Probation officer about " when I hear voices" she is smiling coyly.      R Safety in plac.e

## 2019-01-02 NOTE — Progress Notes (Signed)
[place] NOVEL CORONAVIRUS (COVID-19) DAILY CHECK-OFF SYMPTOMS - answer yes or no to each - every day NO YES  Have you had a fever in the past 24 hours?  . Fever (Temp > 37.80C / 100F) X   Have you had any of these symptoms in the past 24 hours? . New Cough .  Sore Throat  .  Shortness of Breath .  Difficulty Breathing .  Unexplained Body Aches   X   Have you had any one of these symptoms in the past 24 hours not related to allergies?   . Runny Nose .  Nasal Congestion .  Sneezing   X   If you have had runny nose, nasal congestion, sneezing in the past 24 hours, has it worsened?  X   EXPOSURES - check yes or no X   Have you traveled outside the state in the past 14 days?  X   Have you been in contact with someone with a confirmed diagnosis of COVID-19 or PUI in the past 14 days without wearing appropriate PPE?  X   Have you been living in the same home as a person with confirmed diagnosis of COVID-19 or a PUI (household contact)?    X   Have you been diagnosed with COVID-19?    X              What to do next: Answered NO to all: Answered YES to anything:   Proceed with unit schedule Follow the [facility] Inpatient Flowsheet.   

## 2019-01-02 NOTE — Progress Notes (Signed)
[facility] MD Progress Note  01/02/2019 10:56 AM Leah [name]  MRN:  [id number] Subjective: Patient states she is feeling better overall, but reports that current risperidone management is causing excessive sedation.  Wants to consider medication less associated with sedation. Denies suicidal ideations.  Objective: I have discussed case with treatment team and have met with patient.  23 year old female, admitted for suicidal ideations.  Reported history of auditory hallucinations.  Past history of bipolar disorder diagnoses. Today patient describes some improvement compared to admission.  Denies suicidal ideations.. Denies psychotic symptoms.  Currently is interested in changing risperidone to another mood stabilizer medication as she states that risperidone causes excessive sedation. No disruptive or agitated behaviors on unit, limited participation in milieu.  Polite on approach but limited  information forwarded   Principal Problem: Depression Diagnosis: Active Problems:   Severe recurrent major depression without psychotic features ([place])  Total Time spent with patient: 15 minutes  Past Psychiatric History: See admission H&P  Past Medical History:  Past Medical History:  Diagnosis Date  . Anemia 2013  . Anxiety 2011   no meds during pregnancy  . Bipolar 1 disorder ([place])   . Chlamydia   . Gall stones   . GERD (gastroesophageal reflux disease)   . 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 Seizure [facility]) 2011   pseudoseizures    Past Surgical History:  Procedure Laterality Date  . CHOLECYSTECTOMY N/A 09/28/2014   Procedure: LAPAROSCOPIC CHOLECYSTECTOMY;  Surgeon: Ralene Ok, MD;  Location: [place];  Service: General;  Laterality: N/A;  . CHOLECYSTECTOMY    . DIRECT LARYNGOSCOPY N/A 04/24/2014   Procedure: DIRECT LARYNGOSCOPY;  Surgeon: Ascencion Dike, MD;  Location: [place];  Service: ENT;  Laterality: N/A;  . FOREIGN BODY REMOVAL ESOPHAGEAL N/A 04/24/2014   Procedure:  REMOVAL FOREIGN BODY ESOPHAGEAL;  Surgeon: Ascencion Dike, MD;  Location: [facility] OR;  Service: ENT;  Laterality: N/A;  . WISDOM TOOTH EXTRACTION  2013   Family History:  Family History  Problem Relation Age of Onset  . Arthritis Mother   . Bronchitis Mother   . Asthma Mother   . Hearing loss Paternal Grandfather   . Diabetes Neg Hx   . Stomach cancer Neg Hx   . Colon cancer Neg Hx    Family Psychiatric  History: See admission H&P Social History:  Social History   Substance and Sexual Activity  Alcohol Use No  . Alcohol/week: 6.0 standard drinks  . Types: 6 Cans of beer per week     Social History   Substance and Sexual Activity  Drug Use Yes  . Types: Marijuana   Comment: occ.    Social History   Socioeconomic History  . Marital status: Single    Spouse name: Not on file  . Number of children: 1  . Years of education: 56 th   . Highest education level: Not on file  Occupational History  . Occupation: Scientist, water quality -[employer]  Social Needs  . Financial resource strain: Not on file  . Food insecurity    Worry: Never true    Inability: Never true  . Transportation needs    Medical: No    Non-medical: No  Tobacco Use  . Smoking status: Light Tobacco Smoker  . Smokeless tobacco: Never Used  . Tobacco comment: marjiuna  Substance and Sexual Activity  . Alcohol use: No    Alcohol/week: 6.0 standard drinks    Types: 6 Cans of  beer per week  . Drug use: Yes    Types: Marijuana    Comment: occ.  Marland Kitchen Sexual activity: Yes    Partners: Male    Birth control/protection: None  Lifestyle  . Physical activity    Days per week: Not on file    Minutes per session: Not on file  . Stress: Not on file  Relationships  . Social Herbalist on phone: Not on file    Gets together: Not on file    Attends religious service: Not on file    Active member of club or organization: Not on file    Attends meetings of clubs or organizations: Not on file    Relationship status: Not on  file  Other Topics Concern  . Not on file  Social History Narrative   ** Merged History Encounter **       Live with roommate and son.    Starting at [employer].    Additional Social History:    Pain Medications: None Prescriptions: Rimeron, Risperdal, Lamictal Over the Counter: None History of alcohol / drug use?: Yes Name of Substance 1: Marijuana 1 - Age of First Use: 23 years of age 65 - Amount (size/oz): half a joint 1 - Frequency: <1x/Month 1 - Duration: off and on 1 - Last Use / Amount: 3 days ago Name of Substance 2: ETOH 2 - Age of First Use: 23 years of age 38 - Amount (size/oz): 3 glasses of liquor or beer 2 - Frequency: <1x/M 2 - Duration: off and on 2 - Last Use / Amount: last week  Sleep: Good  Appetite:  Good  Current Medications: Current Facility-Administered Medications  Medication Dose Route Frequency Provider Last Rate Last Dose  . acetaminophen (TYLENOL) tablet 650 mg  650 mg Oral Q6H PRN Lindon Romp A, NP   650 mg at 01/01/19 0641  . alum & mag hydroxide-simeth (MAALOX/MYLANTA) 200-200-20 MG/5ML suspension 30 mL  30 mL Oral Q4H PRN Lindon Romp A, NP      . hydrOXYzine (ATARAX/VISTARIL) tablet 25 mg  25 mg Oral TID PRN Rozetta Nunnery, NP      . lamoTRIgine (LAMICTAL) tablet 25 mg  25 mg Oral Daily Connye Burkitt, NP   25 mg at 01/02/19 0939  . magnesium hydroxide (MILK OF MAGNESIA) suspension 30 mL  30 mL Oral Daily PRN Lindon Romp A, NP      . nicotine (NICODERM CQ - dosed in mg/24 hours) patch 21 mg  21 mg Transdermal Daily ,  A, MD      . risperiDONE (RISPERDAL) tablet 1 mg  1 mg Oral BID Connye Burkitt, NP   1 mg at 01/02/19 0939  . traZODone (DESYREL) tablet 50 mg  50 mg Oral QHS PRN Rozetta Nunnery, NP        Lab Results:  No results found for this or any previous visit (from the past 48 hour(s)).  Blood Alcohol level:  Lab Results  Component Value Date   [facility] <5 08/19/2016   ETH <5 34/19/6222    Metabolic Disorder  Labs: Lab Results  Component Value Date   HGBA1C 5.3 12/31/2018   MPG 105.41 12/31/2018   MPG 103 08/22/2016   Lab Results  Component Value Date   PROLACTIN 18.9 10/01/2017   PROLACTIN 6.4 11/18/2014   Lab Results  Component Value Date   CHOL 159 12/31/2018   TRIG 31 12/31/2018   HDL 57 12/31/2018  CHOLHDL 2.8 12/31/2018   VLDL 6 12/31/2018   LDLCALC 96 12/31/2018   LDLCALC 82 08/22/2016    Physical Findings: AIMS:  , ,  ,  ,    CIWA:  CIWA-Ar Total: 1 COWS:     Musculoskeletal: Strength & Muscle Tone: within normal limits Gait & Station: normal Patient leans: N/A  Psychiatric Specialty Exam: Physical Exam  Nursing note and vitals reviewed. Constitutional: She is oriented to person, place, and time. She appears well-developed and well-nourished.  Cardiovascular: Normal rate.  Respiratory: Effort normal.  Neurological: She is alert and oriented to person, place, and time.    Review of Systems  Constitutional: Negative.   Respiratory: Negative for cough and shortness of breath.   Cardiovascular: Negative for chest pain.  Gastrointestinal: Negative for diarrhea, nausea and vomiting.  Neurological: Negative for headaches.  Psychiatric/Behavioral: Positive for hallucinations. Negative for depression, substance abuse and suicidal ideas. The patient is not nervous/anxious and does not have insomnia.   Denies chest pain or shortness of breath, no nausea, no vomiting, no fever, no chills  Blood pressure 106/77, pulse 82, temperature 98.5 F (36.9 C), temperature source Oral, resp. rate 18, height 5' (1.524 m), weight 52.2 kg, last menstrual period 12/07/2018, SpO2 100 %.Body mass index is 22.46 kg/m.  General Appearance: Casual  Eye Contact:  Fair-improves during session  Speech:  Decreased   Volume:  Decreased  Mood:  Acknowledges improving mood compared to admission  Affect:  Smiles at times appropriately during session, reactive  Thought Process:  Linear and  Descriptions of Associations: Intact  Orientation:  Other:  Presents alert and attentive  Thought Content: Currently does not appear internally preoccupied, does not endorse hallucinations today, no delusions currently expressed  Suicidal Thoughts:  Denies-denies any suicidal or self-injurious ideations, contracts for safety on unit  Homicidal Thoughts:  Denies  Memory:  Recent and remote fair  Judgement:  Fair/improving  Insight:  Fair  Psychomotor Activity:  Normal-no psychomotor agitation or restlessness  Concentration:  Fair   Recall:  [employer] of Knowledge:  Fair  Language:  Fair  Akathisia:  No  Handed:  Right  AIMS (if indicated):     Assets:  Armed forces logistics/support/administrative officer Housing Physical Health  ADL's:  Intact  Cognition:  WNL  Sleep:  Number of Hours: 6.75   Assessment -  23 year old female, admitted for suicidal ideations.  Reported history of auditory hallucinations.  Past history of bipolar disorder diagnoses.  Today patient describes some improvement.  She presents alert, attentive, in no acute distress, with fair eye contact, low speech.  Currently denies suicidal ideations.  At present does not appear internally preoccupied and denies hallucinations today.  She does express a desire to switch from risperidone to another anti-psychotic or mood stabilizer as she feels risperidone is causing excessive sedation.  We discussed options and she agrees to Abilify trial, which she states she has not been on before, side effects discussed.  Treatment Plan Summary: Daily contact with patient to assess and evaluate symptoms and progress in treatment and Medication management  Treatment plan reviewed as below today 6/13 Encourage group and milieu participation Discontinue risperidone, see above Start Abilify 5 mg daily for mood disorder, psychotic symptoms Continue Lamictal 25 mg PO daily for mood disorder Continue Trazodone 50 mg PO QHS PRN insomnia Continue Vistaril 25 mg PO TID PRN  anxiety Treatment team working on disposition planning options  Jenne Campus, MD 01/02/2019, 10:56 AM   Patient ID: [place]  Broussard, female   DOB: 10/01/1995, 23 y.o.   MRN: [id number]  

## 2019-01-02 NOTE — Progress Notes (Signed)
D.  Pt has remained in bed so far this shift.  Pt respirations even and unlabored, no distress noted.  Pt has denied SI/HI/AVH at this time.  A.  Support and encouragement offered  R.  Pt remains safe on the unit, will continue to monitor.

## 2019-01-02 NOTE — Progress Notes (Signed)
D: Patient observed in bed all evening. Appears to pretend to be sleeping when staff attempts to awaken her. Patient forwards very minimal information and continues to speak in a near whisper. Patient's affect preoccupied, mood anxious when interacting with this Probation officer. Denies pain, physical complaints. COVID-19 screen negative, afebrile. Respiratory assessment WDL.  A: Refused prn medications and no meds scheduled this evening. Medication options/education provided. Level III obs in place for safety. Emotional support offered. Patient encouraged to complete Suicide Safety Plan before discharge. Encouraged to attend and participate in unit programming.    R: Patient verbalizes understanding of POC. Patient denies SI/HI/AVH and remains safe on level III obs. Will continue to monitor throughout the night.

## 2019-01-02 NOTE — Progress Notes (Signed)
Psychoeducational Group Note  Date:  01/02/2019 Time:  2124  Group Topic/Focus:  Wrap-Up Group:   The focus of this group is to help patients review their daily goal of treatment and discuss progress on daily workbooks.  Participation Level: Did Not Attend  Participation Quality:  Not Applicable  Affect:  Not Applicable  Cognitive:  Not Applicable  Insight:  Not Applicable  Engagement in Group: Not Applicable  Additional Comments:  The patient did not attend group this evening since she was asleep in her bedroom.   Archie Balboa S 01/02/2019, 9:24 PM

## 2019-01-02 NOTE — Plan of Care (Signed)
  Problem: Education: Goal: Knowledge of [facility] Education information/materials will improve Outcome: Not Progressing   
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
## 2019-01-02 NOTE — Progress Notes (Signed)
Adult Psychoeducational Group Note  Date:  01/02/2019 Time:  1300  Group Topic/Focus:  Identifying Needs:   The focus of this group is to help patients identify their personal needs that have been historically problematic and identify healthy behaviors to address their needs.  Participation Level:  Did Not Attend  Participation Quality:    Affect:    Cognitive:    Insight:   Engagement in Group:    Modes of Intervention:    Additional Comments:    Lauralyn Primes 01/02/2019, 4:54 PM

## 2019-01-03 MED ORDER — ARIPIPRAZOLE 5 MG PO TABS
5.0000 mg | ORAL_TABLET | Freq: Every day | ORAL | Status: DC
  Administered 2019-01-03: 21:00:00 5 mg via ORAL
  Filled 2019-01-03 (×3): qty 1

## 2019-01-03 MED ORDER — ARIPIPRAZOLE 5 MG PO TABS
5.0000 mg | ORAL_TABLET | Freq: Every day | ORAL | Status: DC
  Filled 2019-01-03: qty 1

## 2019-01-03 MED ORDER — IBUPROFEN 400 MG PO TABS
400.0000 mg | ORAL_TABLET | Freq: Four times a day (QID) | ORAL | Status: DC | PRN
  Administered 2019-01-03 – 2019-01-04 (×2): 400 mg via ORAL
  Filled 2019-01-03 (×2): qty 1

## 2019-01-03 NOTE — Progress Notes (Signed)
D.  Pt pleasant on approach, denies complaints at this time other than her right arm remaining sore.  Pt states she will refuse  labs in the AM because of this.  Pt asked about her Abilify for tonight as well.  Pt was positive for evening wrap up group, observed engaged in appropriate but minimal interaction with peers on the unit.  Pt denies SI/HI/AVH at this time.  A.  Support and encouragement offered, medication given as ordered  Spoke with PA and order received to begin Abilify tonight.   R.  Pt remains safe on the unit, will continue to monitor.

## 2019-01-03 NOTE — Progress Notes (Signed)
D-pt is quiet and seems to keep to herself, pt seems to be attention seeking at times; pt denies depression or anxiety today A-pt took her medications and attended group R-cont to monitor for safety

## 2019-01-03 NOTE — Progress Notes (Signed)
Patient ID: Leah [name], female   DOB: Nov 21, 1995, 23 y.o.   MRN: [id number]  Patient continuing to complain of discomfort to R AC, however no redness, streaking, inflammation or discoloration noted. Patient's R brachial and radial pulse are normal. Patient observed up on the phone for most of the day in no distress. MD and NP have assessed arm with no significant findings. Will continue to support and monitor.

## 2019-01-03 NOTE — Progress Notes (Signed)
[facility] Group Notes:  (Nursing/MHT/Case Management/Adjunct)  Date:  01/03/2019  Time: 2030 Type of Therapy:  wrap up group  Participation Level:  Active  Participation Quality:  Appropriate, Attentive, Sharing and Supportive  Affect:  Resistant  Cognitive:  Appropriate  Insight:  Improving  Engagement in Group:  Engaged  Modes of Intervention:  Clarification, Education and Support  Summary of Progress/Problems: Pt wasn't able to come up with something good that happened today. Pt was asked if her phone calls were good after this writer witnessed her on the phone often. Pt reported good phone calls and she was calling "everyone". Pt wants to work on her anger and is grateful for her 24 year old son.   Shellia Cleverly 01/03/2019, 9:16 PM

## 2019-01-03 NOTE — [facility] Group Notes (Signed)
[facility] LCSW Group Therapy Note  Date/Time:  01/03/2019 9:00-10:00 or 10:00-11:00AM  Type of Therapy and Topic:  Group Therapy:  Healthy and Unhealthy Supports  Participation Level:  Active   Description of Group:  Patients in this group were introduced to the idea of adding a variety of healthy supports to address the various needs in their lives.Patients discussed what additional healthy supports could be helpful in their recovery and wellness after discharge in order to prevent future hospitalizations.   An emphasis was placed on using counselor, doctor, therapy groups, 12-step groups, and problem-specific support groups to expand supports.  They also worked as a group on developing a specific plan for several patients to deal with unhealthy supports through [place], psychoeducation with loved ones, and even termination of relationships.   Therapeutic Goals:   1)  discuss importance of adding supports to stay well once out of the hospital  2)  compare healthy versus unhealthy supports and identify some examples of each  3)  generate ideas and descriptions of healthy supports that can be added  4)  offer mutual support about how to address unhealthy supports  5)  encourage active participation in and adherence to discharge plan    Summary of Patient Progress:  The patient stated that current healthy supports in her life are her manager and their family. The patient denied having negative supports in her life and was able to identify needed additional supports.   Therapeutic Modalities:   Motivational Interviewing Brief Solution-Focused Therapy  Rolanda Jay

## 2019-01-03 NOTE — Progress Notes (Addendum)
[facility] MD Progress Note  01/03/2019 9:22 AM Leah [name]  MRN:  [id number] Subjective:  Patient reports some improvement compared to admission. She denies suicidal ideations. She complains of pain on her R antecubital area , which she states started after recent blood draw at the site, and has tended to worsen.  She reports hallucinations have improved, and does not appear internally preoccupied.  Objective: I have discussed case with treatment team and have met with patient.  23 year old female, admitted for suicidal ideations.  Reported history of auditory hallucinations.  Past history of bipolar disorder diagnoses. Patient presents alert, attentive, vaguely guarded. Denies hallucinations and does not appear internally preoccupied. Reports her mood is better. Denies SI. At this time focuses on R arm antecubital area pain, as above . Denies trauma. With female RNJarrett Soho) present I examined her arm- no erythema or warmth, no streaking,  no inflammation noted, but painful on palpation. Distal perfusion and sensation are intact, and no inflammation is noted distally to antecubital fossa. Elbow range of motion is preserved although she states it is painful.   Principal Problem: Depression Diagnosis: Active Problems:   Severe recurrent major depression without psychotic features ([place])  Total Time spent with patient: 15 minutes  Past Psychiatric History: See admission H&P  Past Medical History:  Past Medical History:  Diagnosis Date  . Anemia 2013  . Anxiety 2011   no meds during pregnancy  . Bipolar 1 disorder ([place])   . Chlamydia   . Gall stones   . GERD (gastroesophageal reflux disease)   . 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 Seizure [facility]) 2011   pseudoseizures    Past Surgical History:  Procedure Laterality Date  . CHOLECYSTECTOMY N/A 09/28/2014   Procedure: LAPAROSCOPIC CHOLECYSTECTOMY;  Surgeon: Ralene Ok, MD;  Location: [facility];  Service: General;   Laterality: N/A;  . CHOLECYSTECTOMY    . DIRECT LARYNGOSCOPY N/A 04/24/2014   Procedure: DIRECT LARYNGOSCOPY;  Surgeon: Ascencion Dike, MD;  Location: [facility];  Service: ENT;  Laterality: N/A;  . FOREIGN BODY REMOVAL ESOPHAGEAL N/A 04/24/2014   Procedure: REMOVAL FOREIGN BODY ESOPHAGEAL;  Surgeon: Ascencion Dike, MD;  Location: [facility] OR;  Service: ENT;  Laterality: N/A;  . WISDOM TOOTH EXTRACTION  2013   Family History:  Family History  Problem Relation Age of Onset  . Arthritis Mother   . Bronchitis Mother   . Asthma Mother   . Hearing loss Paternal Grandfather   . Diabetes Neg Hx   . Stomach cancer Neg Hx   . Colon cancer Neg Hx    Family Psychiatric  History: See admission H&P Social History:  Social History   Substance and Sexual Activity  Alcohol Use No  . Alcohol/week: 6.0 standard drinks  . Types: 6 Cans of beer per week     Social History   Substance and Sexual Activity  Drug Use Yes  . Types: Marijuana   Comment: occ.    Social History   Socioeconomic History  . Marital status: Single    Spouse name: Not on file  . Number of children: 1  . Years of education: 76 th   . Highest education level: Not on file  Occupational History  . Occupation: Scientist, water quality -[employer]  Social Needs  . Financial resource strain: Not on file  . Food insecurity    Worry: Never true    Inability: Never true  . Transportation needs  Medical: No    Non-medical: No  Tobacco Use  . Smoking status: Light Tobacco Smoker  . Smokeless tobacco: Never Used  . Tobacco comment: marjiuna  Substance and Sexual Activity  . Alcohol use: No    Alcohol/week: 6.0 standard drinks    Types: 6 Cans of beer per week  . Drug use: Yes    Types: Marijuana    Comment: occ.  Marland Kitchen Sexual activity: Yes    Partners: Male    Birth control/protection: None  Lifestyle  . Physical activity    Days per week: Not on file    Minutes per session: Not on file  . Stress: Not on file  Relationships  . Social Product manager on phone: Not on file    Gets together: Not on file    Attends religious service: Not on file    Active member of club or organization: Not on file    Attends meetings of clubs or organizations: Not on file    Relationship status: Not on file  Other Topics Concern  . Not on file  Social History Narrative   ** Merged History Encounter **       Live with roommate and son.    Starting at [employer].    Additional Social History:    Pain Medications: None Prescriptions: Rimeron, Risperdal, Lamictal Over the Counter: None History of alcohol / drug use?: Yes Name of Substance 1: Marijuana 1 - Age of First Use: 23 years of age 23 - Amount (size/oz): half a joint 1 - Frequency: <1x/Month 1 - Duration: off and on 1 - Last Use / Amount: 3 days ago Name of Substance 2: ETOH 2 - Age of First Use: 23 years of age 8 - Amount (size/oz): 3 glasses of liquor or beer 2 - Frequency: <1x/M 2 - Duration: off and on 2 - Last Use / Amount: last week  Sleep: Good  Appetite:  Good  Current Medications: Current Facility-Administered Medications  Medication Dose Route Frequency Provider Last Rate Last Dose  . acetaminophen (TYLENOL) tablet 650 mg  650 mg Oral Q6H PRN Lindon Romp A, NP   650 mg at 01/01/19 0641  . alum & mag hydroxide-simeth (MAALOX/MYLANTA) 200-200-20 MG/5ML suspension 30 mL  30 mL Oral Q4H PRN Lindon Romp A, NP      . hydrOXYzine (ATARAX/VISTARIL) tablet 25 mg  25 mg Oral TID PRN Rozetta Nunnery, NP      . lamoTRIgine (LAMICTAL) tablet 25 mg  25 mg Oral Daily Connye Burkitt, NP   25 mg at 01/03/19 0803  . magnesium hydroxide (MILK OF MAGNESIA) suspension 30 mL  30 mL Oral Daily PRN Lindon Romp A, NP      . risperiDONE (RISPERDAL) tablet 1 mg  1 mg Oral BID Connye Burkitt, NP   1 mg at 01/02/19 0939  . traZODone (DESYREL) tablet 50 mg  50 mg Oral QHS PRN Rozetta Nunnery, NP        Lab Results:  No results found for this or any previous visit (from the past 48  hour(s)).  Blood Alcohol level:  Lab Results  Component Value Date   [facility] <5 08/19/2016   ETH <5 76/16/0737    Metabolic Disorder Labs: Lab Results  Component Value Date   HGBA1C 5.3 12/31/2018   MPG 105.41 12/31/2018   MPG 103 08/22/2016   Lab Results  Component Value Date   PROLACTIN 18.9 10/01/2017   PROLACTIN  6.4 11/18/2014   Lab Results  Component Value Date   CHOL 159 12/31/2018   TRIG 31 12/31/2018   HDL 57 12/31/2018   CHOLHDL 2.8 12/31/2018   VLDL 6 12/31/2018   LDLCALC 96 12/31/2018   LDLCALC 82 08/22/2016    Physical Findings: AIMS: Facial and Oral Movements Muscles of Facial Expression: None, normal Lips and Perioral Area: None, normal Jaw: None, normal Tongue: None, normal,Extremity Movements Upper (arms, wrists, hands, fingers): None, normal Lower (legs, knees, ankles, toes): None, normal, Trunk Movements Neck, shoulders, hips: None, normal, Overall Severity Severity of abnormal movements (highest score from questions above): None, normal Incapacitation due to abnormal movements: None, normal Patient's awareness of abnormal movements (rate only patient's report): No Awareness, Dental Status Current problems with teeth and/or dentures?: No Does patient usually wear dentures?: No  CIWA:  CIWA-Ar Total: 1 COWS:     Musculoskeletal: Strength & Muscle Tone: within normal limits Gait & Station: normal Patient leans: N/A  Psychiatric Specialty Exam: Physical Exam  Nursing note and vitals reviewed. Constitutional: She is oriented to person, place, and time. She appears well-developed and well-nourished.  Cardiovascular: Normal rate.  Respiratory: Effort normal.  Neurological: She is alert and oriented to person, place, and time.    Review of Systems  Constitutional: Negative.   Respiratory: Negative for cough and shortness of breath.   Cardiovascular: Negative for chest pain.  Gastrointestinal: Negative for diarrhea, nausea and vomiting.   Neurological: Negative for headaches.  Psychiatric/Behavioral: Positive for hallucinations. Negative for depression, substance abuse and suicidal ideas. The patient is not nervous/anxious and does not have insomnia.   Denies chest pain or shortness of breath, no nausea, no vomiting, no fever, no chills Reports R antecubital pain  Blood pressure 109/77, pulse 91, temperature 99.2 F (37.3 C), temperature source Oral, resp. rate 18, height 5' (1.524 m), weight 52.2 kg, last menstrual period 12/07/2018, SpO2 100 %.Body mass index is 22.46 kg/m.  General Appearance: Casual  Eye Contact:  Fair-improves during session  Speech:  Decreased   Volume:  Decreased  Mood:  reports feeling better  Affect:  vaguely guarded , smiles at times appropriately  Thought Process:  Linear and Descriptions of Associations: Intact  Orientation:  Other:  Presents alert and attentive  Thought Content: denies hallucinations and does not appear internally preoccupied, no delusions are expressed   Suicidal Thoughts:  Denies-denies any suicidal or self-injurious ideations, contracts for safety on unit  Homicidal Thoughts:  Denies  Memory:  Recent and remote fair  Judgement:  Fair/improving  Insight:  Fair  Psychomotor Activity:  Normal-no psychomotor agitation or restlessness  Concentration:  Fair   Recall:  [employer] of Knowledge:  Fair  Language:  Fair  Akathisia:  No  Handed:  Right  AIMS (if indicated):     Assets:  Armed forces logistics/support/administrative officer Housing Physical Health  ADL's:  Intact  Cognition:  WNL  Sleep:  Number of Hours: 6.5   Assessment -  23 year old female, admitted for suicidal ideations.  Reported history of auditory hallucinations.  Past history of bipolar disorder diagnoses.  Patient presents alert, attentive, reporting improving mood and denying any suicidal ideations. Affect vaguely guarded. Reports worsening R antecubital pain which she states started after most recent blood draw on 6/11 but has  persisted and worsened. Physical exam unremarkable ( no erythema, no inflammation) although complains of pain on palpation.     Treatment Plan Summary: Daily contact with patient to assess and evaluate symptoms and progress in  treatment and Medication management  Treatment plan reviewed as below today 6/14 Encourage group and milieu participation Continue  Abilify 5 mg daily for mood disorder, psychotic symptoms Start Ibuprofen 400 mgr Q 6 hours PRN for pain Start Warm Compresses to site PRN for pain Continue Lamictal 25 mg PO daily for mood disorder Continue Trazodone 50 mg PO QHS PRN insomnia Continue Vistaril 25 mg PO TID PRN anxiety Treatment team working on disposition planning options Check CBC in AM   A , MD 01/03/2019, 9:22 AM   Patient ID: Laquitha M Murtha, female   DOB: 08/04/1995, 23 y.o.   MRN: [id number]  

## 2019-01-04 MED ORDER — ARIPIPRAZOLE 5 MG PO TABS: 5.0000 mg | ORAL_TABLET | Freq: Every day | ORAL | 2 refills | Status: DC

## 2019-01-04 MED ORDER — LAMOTRIGINE 25 MG PO TABS: 25.0000 mg | ORAL_TABLET | Freq: Two times a day (BID) | ORAL | 2 refills | Status: DC

## 2019-01-04 NOTE — Progress Notes (Signed)
Discharge note: Patient reviewed discharge paperwork with RN including prescriptions, follow up appointments, and lab work. Patient given the opportunity to ask questions. All concerns were addressed. All belongings were returned to patient. Denied SI/HI/AVH. Patient thanked staff for their care while at the hospital.  Patient was discharged to lobby where her mom picked her up.

## 2019-01-04 NOTE — Progress Notes (Signed)
Spiritual care group on grief and loss facilitated by chaplain Harlan Vinal  Group Goal:  Support / Education around grief and loss Members engage in facilitated group support and psycho social education.  Group Description:  Following introductions and group rules,  Group members engaged in facilitated group dialog and support around topic of loss, with particular support around experiences of loss in their lives. Group Identified types of loss (relationships / self / things) and identified patterns, circumstances, and changes that precipitate losses. Reflected on thoughts / feelings around loss, normalized grief responses, and recognized variety in grief experience. Patient Progress:  Pt did not attend group 

## 2019-01-04 NOTE — Discharge Summary (Signed)
Physician Discharge Summary Note  Patient:  Leah [name] is an 23 y.o., female MRN:  [id number] DOB:  [birth year] Patient phone:  [phone] (home)  Patient address:   [address] [place] [address],  Total Time spent with patient: 45 minutes  Date of Admission:  12/31/2018 Date of Discharge: 01/04/2019  Reason for Admission:   Patient reports this is her first admission she is a 23 year old single patient who phoned the police herself endorsing suicidal thoughts and knowing she needed help.  She is an extremely vague historian however and she whispers through the interview and this is been consistent since her evaluation initially.  She simply will not speak up but she does whisper and give answers.  She makes poor eye contact.  Patient states she is stressed about "everything" and will not be more specific.  She states she has secured housing she has a child in the care of her mother, she denies recent drug use her drug screen is negative she reports using cannabis intermittently though.  She also states she has been diagnosed with type I bipolar disorder in the past and has been treated with lamotrigine and Risperdal.  But she denies ever having auditory or visual hallucinations ever having any paranoia and when asked if she has had manic episodes she is unsure even after they are described to her.  Again she is very vague and guarded but she states she has suicidal thoughts without a current plan and she can contract for safety here, can repeat back to me what that means. Denies current psychotic symptoms.  Denies thoughts of harming others/denies a history of postpartum depression   Principal Problem: <principal problem not specified> Discharge Diagnoses: Active Problems:   Severe recurrent major depression without psychotic features [facility](HCC)   Past Psychiatric History: see eval  Past Medical History:  Past Medical History:  Diagnosis Date  . Anemia 2013   . Anxiety 2011   no meds during pregnancy  . Bipolar 1 disorder ([facility])   . Chlamydia   . Gall stones   . GERD (gastroesophageal reflux disease)   . 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 Seizure [facility](HCC) 2011   pseudoseizures    Past Surgical History:  Procedure Laterality Date  . CHOLECYSTECTOMY N/A 09/28/2014   Procedure: LAPAROSCOPIC CHOLECYSTECTOMY;  Surgeon: Axel FillerArmando Ramirez, MD;  Location: [facility] OR;  Service: General;  Laterality: N/A;  . CHOLECYSTECTOMY    . DIRECT LARYNGOSCOPY N/A 04/24/2014   Procedure: DIRECT LARYNGOSCOPY;  Surgeon: Darletta MollSui W Teoh, MD;  [facility];  Service: ENT;  Laterality: N/A;  . FOREIGN BODY REMOVAL ESOPHAGEAL N/A 04/24/2014   Procedure: REMOVAL FOREIGN BODY ESOPHAGEAL;  Surgeon: Darletta MollSui W Teoh, MD;  [facility];  Service: ENT;  Laterality: N/A;  . WISDOM TOOTH EXTRACTION  2013   Family History:  Family History  Problem Relation Age of Onset  . Arthritis Mother   . Bronchitis Mother   . Asthma Mother   . Hearing loss Paternal Grandfather   . Diabetes Neg Hx   . Stomach cancer Neg Hx   . Colon cancer Neg Hx    Family Psychiatric  History: no new data Social History:  Social History   Substance and Sexual Activity  Alcohol Use No  . Alcohol/week: 6.0 standard drinks  . Types: 6 Cans of beer per week     Social History   Substance and Sexual Activity  Drug Use Yes  .  Types: Marijuana   Comment: occ.    Social History   Socioeconomic History  . Marital status: Single    Spouse name: Not on file  . Number of children: 1  . Years of education: 65 th   . Highest education level: Not on file  Occupational History  . Occupation: Scientist, water quality -[employer]  Social Needs  . Financial resource strain: Not on file  . Food insecurity    Worry: Never true    Inability: Never true  . Transportation needs    Medical: No    Non-medical: No  Tobacco Use  . Smoking status: Light Tobacco Smoker  . Smokeless tobacco: Never Used  .  Tobacco comment: marjiuna  Substance and Sexual Activity  . Alcohol use: No    Alcohol/week: 6.0 standard drinks    Types: 6 Cans of beer per week  . Drug use: Yes    Types: Marijuana    Comment: occ.  Marland Kitchen Sexual activity: Yes    Partners: Male    Birth control/protection: None  Lifestyle  . Physical activity    Days per week: Not on file    Minutes per session: Not on file  . Stress: Not on file  Relationships  . Social Herbalist on phone: Not on file    Gets together: Not on file    Attends religious service: Not on file    Active member of club or organization: Not on file    Attends meetings of clubs or organizations: Not on file    Relationship status: Not on file  Other Topics Concern  . Not on file  Social History Narrative   ** Merged History Encounter **       Live with roommate and son.    Starting at [employer].     Hospital Course:    As above patient was admitted under routine precautions and she was so passive, minimally talkative, and whispering as to provide very little information.  We were unsure if this represented initially some form of psychosis or rather some form of attention seeking and manipulation.  At any rate there was no evidence of psychosis.  She displayed no danger behaviors.  By the date of the 15th she was out of her room she was talking in a more normal voice she stated she had "a bad moment" but no longer had suicidal thoughts. We were also concerned there may have been some yet to be discerned issues of secondary gain that prompted her to seek hospitalization because she presented 2 days prior with vague GI complaints only to present again with suicidal thoughts at any rate she was requesting discharge and on mental status exam she was in fact checking out to be euthymic without psychosis and without thoughts of harming self or others.  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 that led to presentation  Physical Findings: AIMS: Facial and Oral Movements Muscles of Facial Expression: None, normal Lips and Perioral Area: None, normal Jaw: None, normal Tongue: None, normal,Extremity Movements Upper (arms, wrists, hands, fingers): None, normal Lower (legs, knees, ankles, toes): None, normal, Trunk Movements Neck, shoulders, hips: None, normal, Overall Severity Severity of abnormal movements (highest score from questions above): None, normal Incapacitation due to abnormal movements: None, normal Patient's awareness of abnormal movements (rate only patient's report): No Awareness, Dental Status Current problems  with teeth and/or dentures?: No Does patient usually wear dentures?: No  CIWA:  CIWA-Ar Total: 1 COWS:      Musculoskeletal: Strength & Muscle Tone: within normal limits Gait & Station: normal Patient leans: N/A  Psychiatric Specialty Exam: ROS  Blood pressure 109/77, pulse 91, temperature 99.2 F (37.3 C), temperature source Oral, resp. rate 18, height 5' (1.524 m), weight 52.2 kg, last menstrual period 12/07/2018, SpO2 100 %.Body mass index is 22.46 kg/m.  General Appearance: Casual  Eye Contact::  Good  Speech:  Clear and Coherent409  Volume:  Decreased  Mood:  Euthymic  Affect:  Full Range  Thought Process:  Coherent  Orientation:  Full (Time, Place, and Person)  Thought Content:  Logical  Suicidal Thoughts:  No  Homicidal Thoughts:  No  Memory:  Immediate;   Good  Judgement:  Intact  Insight:  Fair  Psychomotor Activity:  Normal  Concentration:  Good  Recall:  Good  Fund of Knowledge:Good  Language: Good  Akathisia:  Negative  Handed:  Right  AIMS (if indicated):     Assets:  Communication Skills Desire for Improvement Housing Leisure Time Physical Health  Sleep:  Number of Hours: 5.75  Cognition: WNL  ADL's:  Intact     Have you used any form of tobacco in the last 30  days? (Cigarettes, Smokeless Tobacco, Cigars, and/or Pipes): No  Has this patient used any form of tobacco in the last 30 days? (Cigarettes, Smokeless Tobacco, Cigars, and/or Pipes) Yes, No  Blood Alcohol level:  Lab Results  Component Value Date   ETH <5 08/19/2016   ETH <5 12/07/2015    Metabolic Disorder Labs:  Lab Results  Component Value Date   HGBA1C 5.3 12/31/2018   MPG 105.41 12/31/2018   MPG 103 08/22/2016   Lab Results  Component Value Date   PROLACTIN 18.9 10/01/2017   PROLACTIN 6.4 11/18/2014   Lab Results  Component Value Date   CHOL 159 12/31/2018   TRIG 31 12/31/2018   HDL 57 12/31/2018   CHOLHDL 2.8 12/31/2018   VLDL 6 12/31/2018   LDLCALC 96 12/31/2018   LDLCALC 82 08/22/2016    See Psychiatric Specialty Exam and Suicide Risk Assessment completed by Attending Physician prior to discharge.  Discharge destination:  Home  Is patient on multiple antipsychotic therapies at discharge:  No   Has Patient had three or more failed trials of antipsychotic monotherapy by history:  No  Recommended Plan for Multiple Antipsychotic Therapies: NA   Allergies as of 01/04/2019      Reactions   Bactrim [sulfamethoxazole-trimethoprim] Rash      Medication List    STOP taking these medications   acetaminophen 650 MG CR tablet Commonly known as: Tylenol 8 Hour   etonogestrel-ethinyl estradiol 0.12-0.015 MG/24HR vaginal ring Commonly known as: NUVARING     TAKE these medications     Indication  ARIPiprazole 5 MG tablet Commonly known as: ABILIFY Take 1 tablet (5 mg total) by mouth daily.  Indication: MIXED BIPOLAR AFFECTIVE DISORDER   lamoTRIgine 25 MG tablet Commonly known as: LAMICTAL Take 1 tablet (25 mg total) by mouth 2 (two) times daily. Stop if rash seen  Indication: Manic-Depression   MELATONIN PO Take 1 tablet by mouth at bedtime as needed (sleep).  Indication: 21-Hydroxylase Deficiency   norelgestromin-ethinyl estradiol 150-35 MCG/24HR  transdermal patch Commonly known as: ORTHO EVRA Place 1 patch onto the skin once a week.  Indication: Birth Control Treatment   ondansetron 8 MG disintegrating  tablet Commonly known as: Zofran ODT Take 1 tablet (8 mg total) by mouth every 8 (eight) hours as needed for nausea.  Indication: Nausea and Vomiting   phenazopyridine 200 MG tablet Commonly known as: PYRIDIUM Take 1 tablet (200 mg total) by mouth 3 (three) times daily as needed for pain.  Indication: Bladder Lining Irritation      Follow-up Information    [facility] Follow up.   Specialty: Professional Counselor Why: Appointment needed to reestablish services for med mgmt and therapy. Contact information: [facility] [address] [place] [address] [phone]           Follow-up recommendations:  Activity:  full  SignedMalvin Johns: [place] Noori, MD 01/04/2019, 9:42 AM

## 2019-01-04 NOTE — Plan of Care (Signed)
  Problem: Education: Goal: Knowledge of [facility] Education information/materials will improve Outcome: Adequate for Discharge   Problem: Education: Goal: Emotional status will improve Outcome: Adequate for Discharge   Problem: Education: Goal: Mental status will improve Outcome: Adequate for Discharge   Problem: Education: Goal: Verbalization of understanding the information provided will improve Outcome: Adequate for Discharge   Problem: Activity: Goal: Interest or engagement in activities will improve Outcome: Adequate for Discharge   

## 2019-01-04 NOTE — Progress Notes (Signed)
  [facility] Adult Case Management Discharge Plan :  Will you be returning to the same living situation after discharge:  Yes,  home At discharge, do you have transportation home?: Yes,  reports her mother or her sister will pick her up Do you have the ability to pay for your medications: Yes,  Medicaid.  Release of information consent forms completed and in the chart.  Patient to Follow up at: Follow-up Information    [facility] Follow up on 01/05/2019.   Specialty: Professional Counselor Why: Please follow up for services during clinic walk-in hours on Tuesday, 6/16 at 8:30a. Be sure to bring your photo ID, insurance card, current medications and discharge paperwork from this hospitalization.  Contact information: [facility] [place] [address] [phone]           Next level of care provider has access to [place] and Suicide Prevention discussed: Yes,  with mother  Have you used any form of tobacco in the last 30 days? (Cigarettes, Smokeless Tobacco, Cigars, and/or Pipes): No  Has patient been referred to the Quitline?: N/A patient is not a smoker  Patient has been referred for addiction treatment: Yes  Joellen Jersey, [place] 01/04/2019, 11:29 AM

## 2019-01-04 NOTE — BHH Suicide Risk Assessment (Signed)
[facility] Discharge Suicide Risk Assessment   Principal Problem: Initially presenting GI upset and chest pain and then further elaborating 2 days later she had suicidal thoughts. Discharge Diagnoses: Active Problems:   Severe recurrent major depression without psychotic features ([place])   Total Time spent with patient: 45 minutes  Musculoskeletal: Strength & Muscle Tone: within normal limits Gait & Station: normal Patient leans: N/A  Psychiatric Specialty Exam: ROS  Blood pressure 109/77, pulse 91, temperature 99.2 F (37.3 C), temperature source Oral, resp. rate 18, height 5' (1.524 m), weight 52.2 kg, last menstrual period 12/07/2018, SpO2 100 %.Body mass index is 22.46 kg/m.  General Appearance: Casual  Eye Contact::  Good  Speech:  Clear and Coherent409  Volume:  Decreased  Mood:  Euthymic  Affect:  Full Range  Thought Process:  Coherent  Orientation:  Full (Time, Place, and Person)  Thought Content:  Logical  Suicidal Thoughts:  No  Homicidal Thoughts:  No  Memory:  Immediate;   Good  Judgement:  Intact  Insight:  Fair  Psychomotor Activity:  Normal  Concentration:  Good  Recall:  Good  Fund of Knowledge:Good  Language: Good  Akathisia:  Negative  Handed:  Right  AIMS (if indicated):     Assets:  Communication Skills Desire for Improvement Housing Leisure Time Physical Health  Sleep:  Number of Hours: 5.75  Cognition: WNL  ADL's:  Intact   Mental Status Per Nursing Assessment::   On Admission:  NA  Demographic Factors:  Living alone  Loss Factors: NA  Historical Factors: NA  Risk Reduction Factors:   Currently reports she is responding to treatment  Continued Clinical Symptoms:  Denies depression states "I just had a bad moment"  Cognitive Features That Contribute To Risk:  None    Suicide Risk:  Minimal: No identifiable suicidal ideation.  Patients presenting with no risk factors but with morbid ruminations; may be classified as minimal risk  based on the severity of the depressive symptoms  Follow-up [place] Follow up.   Specialty: Professional Counselor Why: Appointment needed to reestablish services for med mgmt and therapy. Contact information: [facility] [place] [address] [phone]           Plan Of Care/Follow-up recommendations:  Activity:  full  Lindie Roberson, MD 01/04/2019, 8:13 AM

## 2019-01-04 NOTE — Progress Notes (Signed)
Recreation Therapy Notes  Date:  6.15.20 Time: 0930 Location: [address] Dayroom  Group Topic: Stress Management  Goal Area(s) Addresses:  Patient will identify positive stress management techniques. Patient will identify benefits of using stress management post d/c.  Intervention: Stress Management  Activity :  Meditation.  LRT introduced the stress management technique of meditation.  LRT played a meditation of making the most of your day.  Patients were to listen and follow along as meditation played to engage in activity.  Education:  Stress Management, Discharge Planning.   Education Outcome: Acknowledges Education  Clinical Observations/Feedback:  Pt did not attend group session.    Obbie Lewallen, LRT/CTRS         Homar Weinkauf A 01/04/2019 11:13 AM 

## 2019-01-04 NOTE — BHH Suicide Risk Assessment (Signed)
[place] INPATIENT:  Family/Significant Other Suicide Prevention Education  Suicide Prevention Education:  Education Completed; mother, Shelle Iron [phone] has been identified by the patient as the family member/significant other with whom the patient will be residing, and identified as the person(s) who will aid the patient in the event of a mental health crisis (suicidal ideations/suicide attempt).  With written consent from the patient, the family member/significant other has been provided the following suicide prevention education, prior to the and/or following the discharge of the patient.  The suicide prevention education provided includes the following:  Suicide risk factors  Suicide prevention and interventions  National Suicide Hotline telephone number  [facility] assessment telephone number  [facility] Emergency Assistance [place] and/or Residential Mobile Crisis Unit telephone number  Request made of family/significant other to:  Remove weapons (e.g., guns, rifles, knives), all items previously/currently identified as safety concern.    Remove drugs/medications (over-the-counter, prescriptions, illicit drugs), all items previously/currently identified as a safety concern.  The family member/significant other verbalizes understanding of the suicide prevention education information provided.  The family member/significant other agrees to remove the items of safety concern listed above.  Mother confirms the patient does not have firearms or weapons in the patient's home. No specific safety concerns for discharge. CSW reviewed SPE, mother asked about patient's outpatient follow up.  Joellen Jersey 01/04/2019, 9:43 AM

## 2019-01-06 ENCOUNTER — Telehealth: Payer: Self-pay

## 2019-01-06 NOTE — Telephone Encounter (Signed)
Called to do COVID Screening for appointment tomorrow. No answer. Left a message to call back. Thanks! 

## 2019-01-07 ENCOUNTER — Ambulatory Visit (INDEPENDENT_AMBULATORY_CARE_PROVIDER_SITE_OTHER): Payer: Medicaid Other | Admitting: Family Medicine

## 2019-01-07 ENCOUNTER — Encounter: Payer: Self-pay | Admitting: Family Medicine

## 2019-01-07 ENCOUNTER — Other Ambulatory Visit: Payer: Self-pay

## 2019-01-07 VITALS — BP 108/65 | HR 78 | Temp 98.8°F | Resp 14 | Ht 60.0 in | Wt 110.0 lb

## 2019-01-07 DIAGNOSIS — I809 Phlebitis and thrombophlebitis of unspecified site: Secondary | ICD-10-CM

## 2019-01-07 MED ORDER — DICLOFENAC SODIUM 1 % TD GEL: 2.0000 g | Freq: Four times a day (QID) | TRANSDERMAL | 0 refills | Status: DC

## 2019-01-07 MED ORDER — CEPHALEXIN 500 MG PO CAPS: 500.0000 mg | ORAL_CAPSULE | Freq: Four times a day (QID) | ORAL | 0 refills | Status: AC

## 2019-01-07 NOTE — Patient Instructions (Signed)
Phlebitis Phlebitis is soreness and swelling (inflammation) of a vein. Follow these instructions at home: Managing pain, stiffness, and swelling  If told, apply heat to the affected area. Do this as often as told by your doctor. Use the heat source that your doctor tells you to use. This may include a moist heat pack or a heating pad. ? Place a towel between your skin and the heat source. ? Leave the heat on for 20-30 minutes. ? Take off the heat if your skin turns bright red. This is very important if you cannot feel pain, heat, or cold. You may be more likely to get burned.  Raise (elevate) the affected area above the level of your heart while you are sitting or lying down. Medicines  Take over-the-counter and prescription medicines only as told by your doctor.  If you were prescribed an antibiotic medicine, take it as told by your doctor. Do not stop taking the antibiotic even if your condition gets better.  If you take medicines to thin your blood, carry a medical alert card or wear your medical alert jewelry. General instructions   If you have phlebitis in your legs: ? Do not stand or sit for a long time. ? Keep your legs moving. ? Get up and take short walks if you have to sit for a long time. ? Try to avoid bed rest that lasts for a long time. Regular sleep is not bed rest.  Wear compression stockings as told by your doctor. These stockings help: ? To reduce swelling in your legs. ? To prevent blood clots. ? To stop the condition from coming back.  Do not use any products that contain nicotine or tobacco, such as cigarettes and e-cigarettes. If you need help quitting, ask your doctor.  Keep all follow-up visits as told by your doctor. This is important. This may include any follow-up blood tests. Contact a doctor if:  You have strange bruises.  You have bleeding problems.  Your symptoms do not get better.  Your symptoms get worse.  You are taking medicine to treat  swelling (anti-inflammatory medicine) and you get belly (abdominal) pain. Get help right away if:  You have sudden chest pain.  You suddenly have trouble breathing.  You have a fever and your symptoms get worse.  You cough up blood.  You feel dizzy or you pass out.  You have very bad pain and swelling in the affected arm or leg. These symptoms may be an emergency. Do not wait to see if the symptoms will go away. Get medical help right away. Call your local emergency services (911 in the [place] Do not drive yourself to the hospital. Summary  Phlebitis is soreness and swelling (inflammation) of a vein.  Raise (elevate) the affected area above the level of your heart while you are sitting or lying down.  If told, apply heat to the affected area. Do this as often as told by your doctor. Use the heat source that your doctor tells you to use. This may include a moist heat pack or a heating pad.  Take over-the-counter and prescription medicines only as told by your doctor. This information is not intended to replace advice given to you by your health care provider. Make sure you discuss any questions you have with your health care provider. Document Released: 06/26/2009 Document Revised: 08/13/2016 Document Reviewed: 08/13/2016 Elsevier Interactive Patient Education  2019 Elsevier Inc.  

## 2019-01-07 NOTE — Progress Notes (Signed)
[name]  New Patient Encounter Provider: Lanae Boast, [place]  JSH:[id number]  DOB - 1995/08/07  SUBJECTIVE:   Leah [name], is a 23 y.o. female who presents to establish care with this clinic.  Patient  Hospitalized from 12/31/2018-01/04/2019  due to suicidal ideations. Inpatient diagnosis of Severe recurrent major depression without psychotic features. Patient was started on aripiprazole 5mg  daily, Lamictal 25 mg BID and melatonin PRN. She was referred to [facility] for further outpatient treatment.  Today, she denies psychosis, delusional thinking, suicidal/homicidal ideations, intent or plan. She presents with complaints of arm pain s/p venipuncture while hospitalized. Patient states that she has been using ibuprofen and hot packs to the area without relief.  Allergies  Allergen Reactions  . Bactrim [Sulfamethoxazole-Trimethoprim] Rash   Past Medical History:  Diagnosis Date  . Anemia 2013  . Anxiety 2011   no meds during pregnancy  . Bipolar 1 disorder ([place])   . Chlamydia   . Gall stones   . GERD (gastroesophageal reflux disease)   . 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 ARIPiprazole (ABILIFY) 5 MG tablet Take 1 tablet (5 mg total) by mouth daily. 30 tablet 2  . lamoTRIgine (LAMICTAL) 25 MG tablet Take 1 tablet (25 mg total) by mouth 2 (two) times daily. Stop if rash seen 60 tablet 2  . norelgestromin-ethinyl estradiol (ORTHO EVRA) 150-35 MCG/24HR transdermal patch Place 1 patch onto the skin once a week. 3 patch 12  . MELATONIN PO Take 1 tablet by mouth at bedtime as needed (sleep).     No current facility-administered medications on file prior to visit.    Family History  Problem Relation Age of Onset  . Arthritis Mother   . Bronchitis  Mother   . Asthma Mother   . Hearing loss Paternal Grandfather   . Diabetes Neg Hx   . Stomach cancer Neg Hx   . Colon cancer Neg Hx    Social History   Socioeconomic History  . Marital status: Single    Spouse name: Not on file  . Number of children: 1  . Years of education: 44 th   . Highest education level: Not on file  Occupational History  . Occupation: Scientist, water quality -[employer]  Social Needs  . Financial resource strain: Not on file  . Food insecurity    Worry: Never true    Inability: Never true  . Transportation needs    Medical: No    Non-medical: No  Tobacco Use  . Smoking status: Light Tobacco Smoker  . Smokeless tobacco: Never Used  . Tobacco comment: marjiuna  Substance and Sexual Activity  . Alcohol use: No    Alcohol/week: 6.0 standard drinks    Types: 6 Cans of beer per week  . Drug use: Yes    Types: Marijuana    Comment: occ.  Marland Kitchen Sexual activity: Yes    Partners: Male    Birth control/protection: None  Lifestyle  . Physical activity    Days per week: Not on file    Minutes per session: Not on file  . Stress: Not on file  Relationships  . Social Herbalist on phone: Not on file    Gets together: Not on file  Attends religious service: Not on file    Active member of club or organization: Not on file    Attends meetings of clubs or organizations: Not on file    Relationship status: Not on file  . Intimate partner violence    Fear of current or ex partner: Not on file    Emotionally abused: Not on file    Physically abused: Not on file    Forced sexual activity: Not on file  Other Topics Concern  . Not on file  Social History Narrative   ** Merged History Encounter **       Live with roommate and son.    Starting at [employer].     Review of Systems  Constitutional: Negative.   HENT: Negative.   Eyes: Negative.   Respiratory: Negative.   Cardiovascular: Negative.   Gastrointestinal: Negative.   Genitourinary: Negative.    Musculoskeletal: Negative.   Skin: Negative.   Neurological: Positive for tingling.  Psychiatric/Behavioral: Negative.      OBJECTIVE:    BP 108/65 (BP Location: Left Arm, Patient Position: Sitting, Cuff Size: Normal)   Pulse 78   Temp 98.8 F (37.1 C) (Oral)   Resp 14   Ht 5' (1.524 m)   Wt 110 lb (49.9 kg)   LMP 01/04/2019   SpO2 100%   BMI 21.48 kg/m   Physical Exam  Constitutional: She is oriented to person, place, and time and well-developed, well-nourished, and in no distress. No distress.  HENT:  Head: Normocephalic and atraumatic.  Eyes: Pupils are equal, round, and reactive to light. Conjunctivae and EOM are normal.  Neck: Normal range of motion. Neck supple.  Cardiovascular: Normal rate, regular rhythm and intact distal pulses. Exam reveals no gallop and no friction rub.  No murmur heard. Pulmonary/Chest: Effort normal and breath sounds normal. No respiratory distress. She has no wheezes.  Abdominal: Soft. Bowel sounds are normal. There is no abdominal tenderness.  Musculoskeletal: Normal range of motion.        General: No tenderness or edema.  Lymphadenopathy:    She has no cervical adenopathy.  Neurological: She is alert and oriented to person, place, and time. Gait normal.  Skin: Skin is warm and dry. There is erythema (right antecubital region with tenderness with palpation).  Psychiatric: Mood, memory, affect and judgment normal.  Nursing note and vitals reviewed.    ASSESSMENT/PLAN: 1. Phlebitis - diclofenac sodium (VOLTAREN) 1 % GEL; Apply 2 g topically 4 (four) times daily.  Dispense: 100 g; Refill: 0 - cephALEXin (KEFLEX) 500 MG capsule; Take 1 capsule (500 mg total) by mouth 4 (four) times daily for 7 days.  Dispense: 28 capsule; Refill: 0   Return in about 1 year (around 01/07/2020), or if symptoms worsen or fail to improve, for General physical.  The patient was given clear instructions to go to ER or return to medical center if symptoms don't  improve, worsen or new problems develop. The patient verbalized understanding. The patient was told to call to get lab results if they haven't heard anything in the next week.     This note has been created with Education officer, environmentalDragon speech recognition software and smart phrase technology. Any transcriptional errors are unintentional.   Ms. Andr L. Riley Lamouglas, FNP-BC [facility] [address]  [place], [place] [id number] [phone]

## 2019-01-27 ENCOUNTER — Encounter: Payer: Self-pay | Admitting: Family Medicine

## 2019-02-10 ENCOUNTER — Other Ambulatory Visit: Payer: Self-pay

## 2019-02-10 ENCOUNTER — Ambulatory Visit (INDEPENDENT_AMBULATORY_CARE_PROVIDER_SITE_OTHER): Payer: Medicaid Other | Admitting: General Practice

## 2019-02-10 ENCOUNTER — Other Ambulatory Visit (HOSPITAL_COMMUNITY)
Admission: RE | Admit: 2019-02-10 | Discharge: 2019-02-10 | Disposition: A | Discharge status: Home / Self Care | Payer: Medicaid Other | Source: Ambulatory Visit | Attending: Family Medicine | Admitting: Family Medicine

## 2019-02-10 DIAGNOSIS — Z113 Encounter for screening for infections with a predominantly sexual mode of transmission: Secondary | ICD-10-CM

## 2019-02-10 DIAGNOSIS — N898 Other specified noninflammatory disorders of vagina: Secondary | ICD-10-CM

## 2019-02-10 DIAGNOSIS — B9689 Other specified bacterial agents as the cause of diseases classified elsewhere: Secondary | ICD-10-CM

## 2019-02-10 MED ORDER — METRONIDAZOLE 0.75 % VA GEL: 1.0000 | Freq: Every day | VAGINAL | 0 refills | Status: AC

## 2019-02-10 NOTE — Progress Notes (Signed)
Patient presents to office today with white, malodorous discharge for the past week. Patient "feels certain it is BV because she gets that a lot." Patient would also like STD screening as well, declines blood testing. Patient instructed in self swab & specimen collected. Discussed results will be back in 24-48 hours and we will contact her if anything is abnormal. Will send in BV treatment now, given patient's history. Metrogel sent in per patient request.  Koren Bound RN BSN 02/10/19

## 2019-02-12 ENCOUNTER — Other Ambulatory Visit: Payer: Self-pay | Admitting: Obstetrics & Gynecology

## 2019-02-12 LAB — CERVICOVAGINAL ANCILLARY ONLY
Bacterial vaginitis: POSITIVE — AB
Candida vaginitis: POSITIVE — AB
Chlamydia: NEGATIVE
Neisseria Gonorrhea: NEGATIVE
Trichomonas: POSITIVE — AB

## 2019-02-12 MED ORDER — FLUCONAZOLE 150 MG PO TABS: 150.0000 mg | ORAL_TABLET | Freq: Once | ORAL | 3 refills | Status: AC

## 2019-02-12 MED ORDER — METRONIDAZOLE 500 MG PO TABS: 500.0000 mg | ORAL_TABLET | Freq: Two times a day (BID) | ORAL | 0 refills | Status: DC

## 2019-02-12 NOTE — Progress Notes (Signed)
Flagyl and diflucan prescribed to treat trich, bv, and yeast. I sent Ms. Utter a Therapist, music.

## 2019-02-13 ENCOUNTER — Other Ambulatory Visit: Payer: Self-pay | Admitting: Obstetrics and Gynecology

## 2019-02-13 ENCOUNTER — Telehealth: Payer: Self-pay | Admitting: Obstetrics and Gynecology

## 2019-02-13 DIAGNOSIS — R112 Nausea with vomiting, unspecified: Secondary | ICD-10-CM

## 2019-02-13 MED ORDER — ONDANSETRON 4 MG PO TBDP: 4.0000 mg | ORAL_TABLET | Freq: Four times a day (QID) | ORAL | 1 refills | Status: DC | PRN

## 2019-02-13 NOTE — Progress Notes (Signed)
Rx sent 

## 2019-02-13 NOTE — Telephone Encounter (Signed)
Received phone call from nurse Ms Sanzo is unable to tolerate Flagyl d/t N/V. Pt on Flagly d/t trich  Spoke with Ms Batty via and informed no other medication available to treat trich. Will send in Rx of Zofran for pt to take 30 minutes prior to Flagyl.  Pt verbalized understanding.

## 2019-02-15 NOTE — Progress Notes (Signed)
I have reviewed this chart and agree with the RN/CMA assessment and management.    Karsten Howry C Chick Cousins, MD, FACOG Attending Physician, [facility]  

## 2019-02-19 ENCOUNTER — Other Ambulatory Visit: Payer: Self-pay

## 2019-02-19 ENCOUNTER — Emergency Department (HOSPITAL_COMMUNITY)
Admission: EM | Admit: 2019-02-19 | Discharge: 2019-02-19 | Disposition: A | Discharge status: Home / Self Care | Payer: Medicaid Other | Attending: Emergency Medicine | Admitting: Emergency Medicine

## 2019-02-19 ENCOUNTER — Encounter (HOSPITAL_COMMUNITY): Payer: Self-pay | Admitting: Emergency Medicine

## 2019-02-19 DIAGNOSIS — Z79899 Other long term (current) drug therapy: Secondary | ICD-10-CM | POA: Diagnosis not present

## 2019-02-19 DIAGNOSIS — Z720 Tobacco use: Secondary | ICD-10-CM | POA: Insufficient documentation

## 2019-02-19 DIAGNOSIS — N939 Abnormal uterine and vaginal bleeding, unspecified: Secondary | ICD-10-CM | POA: Diagnosis not present

## 2019-02-19 DIAGNOSIS — R102 Pelvic and perineal pain: Secondary | ICD-10-CM | POA: Insufficient documentation

## 2019-02-19 DIAGNOSIS — R112 Nausea with vomiting, unspecified: Secondary | ICD-10-CM | POA: Diagnosis not present

## 2019-02-19 LAB — I-STAT BETA HCG BLOOD, ED (MC, WL, AP ONLY): I-stat hCG, quantitative: <5 m[IU]/mL (ref <5)

## 2019-02-19 LAB — LIPASE, BLOOD: Lipase: 27 U/L (ref 11–51)

## 2019-02-19 LAB — URINALYSIS, ROUTINE W REFLEX MICROSCOPIC
Bacteria, UA: NONE SEEN
Bilirubin Urine: NEGATIVE
Glucose, UA: NEGATIVE mg/dL
Ketones, ur: 80 mg/dL — AB
Nitrite: NEGATIVE
Protein, ur: NEGATIVE mg/dL
Specific Gravity, Urine: 1.021 (ref 1.005–1.030)
pH: 5 (ref 5.0–8.0)

## 2019-02-19 LAB — CBC
HCT: 41.8 % (ref 36.0–46.0)
Hemoglobin: 13.6 g/dL (ref 12.0–15.0)
MCH: 31.1 pg (ref 26.0–34.0)
MCHC: 32.5 g/dL (ref 30.0–36.0)
MCV: 95.4 fL (ref 80.0–100.0)
Platelets: 246 10*3/uL (ref 150–400)
RBC: 4.38 MIL/uL (ref 3.87–5.11)
RDW: 12.2 % (ref 11.5–15.5)
WBC: 10.1 10*3/uL (ref 4.0–10.5)
nRBC: 0 % (ref 0.0–0.2)

## 2019-02-19 LAB — COMPREHENSIVE METABOLIC PANEL
ALT: 33 U/L (ref 0–44)
AST: 23 U/L (ref 15–41)
Albumin: 3.9 g/dL (ref 3.5–5.0)
Alkaline Phosphatase: 34 U/L — ABNORMAL LOW (ref 38–126)
Anion gap: 8 (ref 5–15)
BUN: 19 mg/dL (ref 6–20)
CO2: 24 mmol/L (ref 22–32)
Calcium: 9 mg/dL (ref 8.9–10.3)
Chloride: 105 mmol/L (ref 98–111)
Creatinine, Ser: 0.68 mg/dL (ref 0.44–1.00)
GFR calc Af Amer: >60 mL/min (ref >60)
GFR calc non Af Amer: >60 mL/min (ref >60)
Glucose, Bld: 83 mg/dL (ref 70–99)
Potassium: 3.8 mmol/L (ref 3.5–5.1)
Sodium: 137 mmol/L (ref 135–145)
Total Bilirubin: 0.9 mg/dL (ref 0.3–1.2)
Total Protein: 7.2 g/dL (ref 6.5–8.1)

## 2019-02-19 MED ORDER — OMEPRAZOLE 20 MG PO CPDR: 20.0000 mg | DELAYED_RELEASE_CAPSULE | Freq: Every day | ORAL | 0 refills | Status: DC

## 2019-02-19 MED ORDER — ALUM & MAG HYDROXIDE-SIMETH 200-200-20 MG/5ML PO SUSP
30.0000 mL | Freq: Once | ORAL | Status: AC
  Administered 2019-02-19: 30 mL via ORAL
  Filled 2019-02-19: qty 30

## 2019-02-19 MED ORDER — SODIUM CHLORIDE 0.9 % IV BOLUS
1000.0000 mL | Freq: Once | INTRAVENOUS | Status: AC
  Administered 2019-02-19: 1000 mL via INTRAVENOUS

## 2019-02-19 MED ORDER — LIDOCAINE VISCOUS HCL 2 % MT SOLN
15.0000 mL | Freq: Once | OROMUCOSAL | Status: AC
  Administered 2019-02-19: 12:00:00 15 mL via ORAL
  Filled 2019-02-19: qty 15

## 2019-02-19 MED ORDER — SUCRALFATE 1 G PO TABS: 1.0000 g | ORAL_TABLET | Freq: Three times a day (TID) | ORAL | 0 refills | Status: DC

## 2019-02-19 MED ORDER — DICYCLOMINE HCL 20 MG PO TABS: 20.0000 mg | ORAL_TABLET | Freq: Three times a day (TID) | ORAL | 0 refills | Status: DC | PRN

## 2019-02-19 MED ORDER — ONDANSETRON 4 MG PO TBDP: 4.0000 mg | ORAL_TABLET | Freq: Four times a day (QID) | ORAL | 0 refills | Status: DC | PRN

## 2019-02-19 MED ORDER — ONDANSETRON HCL 4 MG/2ML IJ SOLN
4.0000 mg | Freq: Once | INTRAMUSCULAR | Status: AC
  Administered 2019-02-19: 11:00:00 4 mg via INTRAVENOUS
  Filled 2019-02-19: qty 2

## 2019-02-19 NOTE — Discharge Instructions (Signed)
As we discussed today your labs were reassuring. I have given you for prescriptions today. The most important 1 is Zofran, this is a nausea medicine but dissolves in your mouth to allow you to drink enough water and food to stay hydrated and safe. 2 Prilosec: This is a heartburn medicine that will help calm down any inflammation in your stomach.  I have printed this prescription for you as this medicine is available over-the-counter.  Please compare it with what you have by 2 verify the dose.  If your insurance will cover it you may use the prescription instead. 3.  Carafate: This medicine will help coat your stomach to help calm down any inflammation. 4 Bentyl: This medicine helps calm down intestinal spasms.    Your spotting may have been due to your antibiotics disrupting your normal birth control.  Anytime you take antibiotics it is important that you are using backup contraception as hormone based birth control does not work as well.

## 2019-02-19 NOTE — ED Notes (Signed)
Patient given sandwich and water for PO challenge

## 2019-02-19 NOTE — ED Provider Notes (Signed)
[facility]-EMERGENCY DEPT Provider Note   CSN: [id number] Arrival date & time: 02/19/19  0749    History   Chief Complaint Chief Complaint  Patient presents with  . Vaginal Bleeding  . Emesis    HPI Leah [name] is a 23 y.o. female who presents today for evaluation of nausea and vaginal spotting. 1.  Nausea and vomiting: She reports that since 7/25 she has had nausea and significant vomiting.  She states that she is able to keep down liquids however anytime she tries to eat food she ends up vomiting it.  She denies diarrhea or constipation.  She reports epigastric abdominal pain.  She denies significant alcohol use.  This started while she was taking Flagyl for trichomoniasis and BV.    2.  Vaginal spotting: She reports that she started having vaginal bleeding on 7/29.  Initially it was heavy however it is now lightened into spotty.  She denies pelvic pain.  Her previous menstrual cycle was July 13-19 which was normal for her.  She reports that since her test that showed trichomoniasis, yeast, and BV she has not had any sexual contacts.  [facility]  She reports that she is using the estrogen patch for contraception.  She denies any headache, shortness of breath, chest pain, fatigue, myalgias, or arthralgias.  She reports urinary frequency however denies dysuria or urgency.    HPI  Past Medical History:  Diagnosis Date  . Anemia 2013  . Anxiety 2011   no meds during pregnancy  . Bipolar 1 disorder (HCC)   . Chlamydia   . Gall stones   . GERD (gastroesophageal reflux disease)   . 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 Seizure [facility]) 2011   pseudoseizures    Patient Active Problem List   Diagnosis Date Noted  . Severe recurrent major depression without psychotic features (HCC) 12/31/2018  . S/P laparoscopic cholecystectomy 10/22/2017  . Pelvic pain 09/12/2017  . Amenorrhea, secondary 09/12/2017  . Major  depressive disorder, recurrent severe without psychotic features (HCC) 08/19/2016  . UTI (urinary tract infection) 08/19/2016  . Suicide attempt by drug ingestion (HCC)   . Contraception 03/10/2014  . Biliary colic 03/10/2014  . BV (bacterial vaginosis) 03/10/2014  . Depression 03/16/2013  . Allergic rhinitis, seasonal 01/26/2013  . Anemia, iron deficiency 12/01/2012    Past Surgical History:  Procedure Laterality Date  . CHOLECYSTECTOMY N/A 09/28/2014   Procedure: LAPAROSCOPIC CHOLECYSTECTOMY;  Surgeon: Axel FillerArmando Ramirez, MD;  Location: [facility] OR;  Service: General;  Laterality: N/A;  . CHOLECYSTECTOMY    . DIRECT LARYNGOSCOPY N/A 04/24/2014   Procedure: DIRECT LARYNGOSCOPY;  Surgeon: Darletta MollSui W Teoh, MD;  [facility];  Service: ENT;  Laterality: N/A;  . FOREIGN BODY REMOVAL ESOPHAGEAL N/A 04/24/2014   Procedure: REMOVAL FOREIGN BODY ESOPHAGEAL;  Surgeon: Darletta MollSui W Teoh, MD;  Location: [facility] OR;  Service: ENT;  Laterality: N/A;  . WISDOM TOOTH EXTRACTION  2013     OB History    Gravida  2   Para  1   Term  1   Preterm  0   AB  1   Living  1     SAB  1   TAB  0   Ectopic  0   Multiple      Live Births  1            Home Medications    Prior to  Admission medications   Medication Sig Start Date End Date Taking? Authorizing Provider  ARIPiprazole (ABILIFY) 5 MG tablet Take 1 tablet (5 mg total) by mouth daily. 01/04/19  Yes Johnn Hai, MD  Ibuprofen-diphenhydrAMINE Cit (ADVIL PM PO) Take 1 tablet by mouth at bedtime as needed (sleep/pain).   Yes [provider]  lamoTRIgine (LAMICTAL) 25 MG tablet Take 1 tablet (25 mg total) by mouth 2 (two) times daily. Stop if rash seen 01/04/19  Yes Johnn Hai, MD  norelgestromin-ethinyl estradiol (ORTHO EVRA) 150-35 MCG/24HR transdermal patch Place 1 patch onto the skin once a week. 08/10/18  Yes Jorje Guild, NP  diclofenac sodium (VOLTAREN) 1 % GEL Apply 2 g topically 4 (four) times daily. Patient not taking: Reported on  02/19/2019 01/07/19   Lanae Boast, FNP  dicyclomine (BENTYL) 20 MG tablet Take 1 tablet (20 mg total) by mouth 3 (three) times daily with meals as needed for spasms. 02/19/19   Lorin Glass, PA-C  fluconazole (DIFLUCAN) 150 MG tablet Take 150 mg by mouth once. 02/12/19   [provider]  metroNIDAZOLE (FLAGYL) 500 MG tablet Take 1 tablet (500 mg total) by mouth 2 (two) times daily. 02/12/19   Emily Filbert, MD  omeprazole (PRILOSEC) 20 MG capsule Take 1 capsule (20 mg total) by mouth daily. 02/19/19   Lorin Glass, PA-C  ondansetron (ZOFRAN ODT) 4 MG disintegrating tablet Take 1 tablet (4 mg total) by mouth every 6 (six) hours as needed for nausea or vomiting. 02/19/19   Lorin Glass, PA-C  sucralfate (CARAFATE) 1 g tablet Take 1 tablet (1 g total) by mouth 4 (four) times daily -  with meals and at bedtime for 14 days. 02/19/19 03/05/19  Lorin Glass, PA-C    Family History Family History  Problem Relation Age of Onset  . Arthritis Mother   . Bronchitis Mother   . Asthma Mother   . Hearing loss Paternal Grandfather   . Diabetes Neg Hx   . Stomach cancer Neg Hx   . Colon cancer Neg Hx     Social History Social History   Tobacco Use  . Smoking status: Light Tobacco Smoker  . Smokeless tobacco: Never Used  . Tobacco comment: marjiuna  Substance Use Topics  . Alcohol use: No    Alcohol/week: 6.0 standard drinks    Types: 6 Cans of beer per week  . Drug use: Yes    Types: Marijuana    Comment: occ.     Allergies   Bactrim [sulfamethoxazole-trimethoprim]   Review of Systems Review of Systems  Constitutional: Negative for chills and fever.  Respiratory: Negative for cough and shortness of breath.   Cardiovascular: Negative for chest pain.  Gastrointestinal: Positive for abdominal pain, nausea and vomiting. Negative for anal bleeding, blood in stool, constipation and diarrhea.  Genitourinary: Positive for frequency and vaginal bleeding.  Negative for dysuria, pelvic pain, urgency, vaginal discharge and vaginal pain.  Musculoskeletal: Negative for back pain and neck pain.  Skin: Negative for color change.  Neurological: Negative for weakness and headaches.  All other systems reviewed and are negative.    Physical Exam Updated Vital Signs BP 111/68   Pulse 71   Temp 98.2 F (36.8 C) (Oral)   Resp 18   LMP 02/01/2019   SpO2 100%   Physical Exam Vitals signs and nursing note reviewed.  Constitutional:      General: She is not in acute distress.    Appearance: She is well-developed. She  is not toxic-appearing.  HENT:     Head: Normocephalic and atraumatic.     Nose: Nose normal.     Mouth/Throat:     Mouth: Mucous membranes are moist.  Eyes:     Conjunctiva/sclera: Conjunctivae normal.  Neck:     Musculoskeletal: Normal range of motion and neck supple.  Cardiovascular:     Rate and Rhythm: Normal rate and regular rhythm.     Heart sounds: No murmur.  Pulmonary:     Effort: Pulmonary effort is normal. No respiratory distress.     Breath sounds: Normal breath sounds.  Abdominal:     General: Abdomen is flat. Bowel sounds are normal. There is no distension.     Palpations: Abdomen is soft.     Tenderness: There is abdominal tenderness in the epigastric area and left upper quadrant.  Genitourinary:    Comments: Deferred Musculoskeletal:     Right lower leg: No edema.     Left lower leg: No edema.  Skin:    General: Skin is warm and dry.  Neurological:     General: No focal deficit present.     Mental Status: She is alert.  Psychiatric:        Mood and Affect: Mood normal.        Behavior: Behavior normal.      ED Treatments / Results  Labs (all labs ordered are listed, but only abnormal results are displayed) Labs Reviewed  URINALYSIS, ROUTINE W REFLEX MICROSCOPIC - Abnormal; Notable for the following components:      Result Value   Hgb urine dipstick MODERATE (*)    Ketones, ur 80 (*)     Leukocytes,Ua TRACE (*)    All other components within normal limits  COMPREHENSIVE METABOLIC PANEL - Abnormal; Notable for the following components:   Alkaline Phosphatase 34 (*)    All other components within normal limits  URINE CULTURE  CBC  LIPASE, BLOOD  I-STAT BETA HCG BLOOD, ED ([facility], [facility], [facility] ONLY)    EKG None  Radiology No results found.  Procedures Procedures (including critical care time)  Medications Ordered in ED Medications  sodium chloride 0.9 % bolus 1,000 mL (0 mLs Intravenous Stopped 02/19/19 1219)  ondansetron (ZOFRAN) injection 4 mg (4 mg Intravenous Given 02/19/19 1123)  alum & mag hydroxide-simeth (MAALOX/MYLANTA) 200-200-20 MG/5ML suspension 30 mL (30 mLs Oral Given 02/19/19 1219)    And  lidocaine (XYLOCAINE) 2 % viscous mouth solution 15 mL (15 mLs Oral Given 02/19/19 1219)     Initial Impression / Assessment and Plan / ED Course  I have reviewed the triage vital signs and the nursing notes.  Pertinent labs & imaging results that were available during my care of the patient were reviewed by me and considered in my medical decision making (see chart for details).  Clinical Course as of Feb 18 1729  Fri Feb 19, 2019  1459 Patient reevaluated, she has passed p.o. challenge.  Will discharge home.   [EH]    Clinical Course User Index [EH] Cristina GongHammond, Keanan Melander W, PA-C      Patient presents today for evaluation of epigastric abdominal pain nausea and vomiting.  This started after she was taking Flagyl.  Labs obtained and reviewed without evidence of significant hematologic or electrolyte derangements.  Her LFTs are normal.  Lipase is not elevated, do not suspect pancreatitis.  She was treated with IV fluids and Zofran after which she felt better.  She was given a GI cocktail with  mild relief in her symptoms.  She was p.o. challenged without difficulty.  Based on the location of her pain with reassuring labs she and I discussed CT scan and evaluation of her care  today.  She was given enough information to make an informed decision in her care and chose to not obtain CT scan.  Will treat with Zofran, Prilosec, Carafate, and Bentyl.  Recommended PCP follow-up.  Regarding her spotting her pregnancy test is negative.  She was recently tested for gonorrhea and chlamydia.  She does not have any pelvic pain with her spotting.  She was offered pelvic exam however declined.  Given that she does not have pelvic pain, her urine does not show evidence of infection, I am unsure what is causing her spotting however I question if her recent antibiotics may have altered her hormone based birth control causing her to have spotting.  Return precautions were discussed with patient who states their understanding.  At the time of discharge patient denied any unaddressed complaints or concerns.  Patient is agreeable for discharge home.    Final Clinical Impressions(s) / ED Diagnoses   Final diagnoses:  Non-intractable vomiting with nausea, unspecified vomiting type  Vaginal spotting    ED Discharge Orders         Ordered    ondansetron (ZOFRAN ODT) 4 MG disintegrating tablet  Every 6 hours PRN     02/19/19 1506    sucralfate (CARAFATE) 1 g tablet  3 times daily with meals & bedtime     02/19/19 1506    dicyclomine (BENTYL) 20 MG tablet  3 times daily with meals PRN     02/19/19 1506    omeprazole (PRILOSEC) 20 MG capsule  Daily     02/19/19 1506           Cristina GongHammond, Debe Anfinson W, [place]PA-C 02/19/19 1730    Pricilla LovelessGoldston, Scott, MD 02/20/19 [phone]

## 2019-02-19 NOTE — ED Triage Notes (Signed)
Pt reports vomiting since July 24th. Reports had menstrual cycle that started July 13, but Wed started having bleeding again. Today is just spotting. Denies pains, bowel or urinary problems.

## 2019-02-19 NOTE — ED Notes (Signed)
Discharge instructions reviewed with patient. Patient verbalizes understanding. VSS.   

## 2019-02-20 LAB — URINE CULTURE: Culture: NO GROWTH

## 2019-02-24 ENCOUNTER — Other Ambulatory Visit: Payer: Self-pay

## 2019-02-24 ENCOUNTER — Encounter: Payer: Self-pay | Admitting: Obstetrics and Gynecology

## 2019-02-24 ENCOUNTER — Ambulatory Visit (INDEPENDENT_AMBULATORY_CARE_PROVIDER_SITE_OTHER): Payer: Medicaid Other | Admitting: Obstetrics and Gynecology

## 2019-02-24 VITALS — BP 131/80 | HR 74 | Temp 98.5°F | Ht 60.0 in | Wt 108.3 lb

## 2019-02-24 DIAGNOSIS — Z30013 Encounter for initial prescription of injectable contraceptive: Secondary | ICD-10-CM

## 2019-02-24 LAB — POCT PREGNANCY, URINE: Preg Test, Ur: NEGATIVE

## 2019-02-24 MED ORDER — MEDROXYPROGESTERONE ACETATE 150 MG/ML IM SUSP: 150.0000 mg | INTRAMUSCULAR | 0 refills | Status: DC

## 2019-02-24 MED ORDER — MEDROXYPROGESTERONE ACETATE 150 MG/ML IM SUSP
150.0000 mg | Freq: Once | INTRAMUSCULAR | Status: AC
  Administered 2019-02-24: 150 mg via INTRAMUSCULAR

## 2019-02-24 NOTE — Progress Notes (Signed)
Pt states currently has the Patch for [facility] wants Depo Shot.

## 2019-02-24 NOTE — Addendum Note (Signed)
Addended by: Bethanne Ginger on: 02/24/2019 03:27 PM   Modules accepted: Orders

## 2019-02-24 NOTE — Progress Notes (Signed)
GYNECOLOGY ENCOUNTER NOTE  History:     Leah [name] is a 23 y.o. G32P1011 female here for birth control visit.  Current complaints none.   Denies abnormal vaginal bleeding, discharge, pelvic pain, problems with intercourse or other gynecologic concerns.  [facility] at [facility] in 2020. She was on the patch and would like to switch to depo. She is aware of the side effects.    Gynecologic History Patient's last menstrual period was 02/01/2019 (exact date). Contraception: Depo-Provera injections Last Pap: 2020. Results were: normal    Obstetric History OB History  Gravida Para Term Preterm AB Living  2 1 1  0 1 1  SAB TAB Ectopic Multiple Live Births  1 0 0   1    # Outcome Date GA Lbr Len/2nd Weight Sex Delivery Anes PTL Lv  2 SAB 09/2013          1 Term 03/14/13 [redacted]w[redacted]d 17:30 / 00:33 6 lb 6.1 oz (2.895 kg) M Vag-Spont EPI  LIV     Birth Comments: none    Past Medical History:  Diagnosis Date  . Anemia 2013  . Anxiety 2011   no meds during pregnancy  . Bipolar 1 disorder ([place])   . Chlamydia   . Gall stones   . GERD (gastroesophageal reflux disease)   . 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 Seizure [facility]) 2011   pseudoseizures    Past Surgical History:  Procedure Laterality Date  . CHOLECYSTECTOMY N/A 09/28/2014   Procedure: LAPAROSCOPIC CHOLECYSTECTOMY;  Surgeon: Ralene Ok, MD;  Location: [place];  Service: General;  Laterality: N/A;  . CHOLECYSTECTOMY    . DIRECT LARYNGOSCOPY N/A 04/24/2014   Procedure: DIRECT LARYNGOSCOPY;  Surgeon: Ascencion Dike, MD;  Location: [place];  Service: ENT;  Laterality: N/A;  . FOREIGN BODY REMOVAL ESOPHAGEAL N/A 04/24/2014   Procedure: REMOVAL FOREIGN BODY ESOPHAGEAL;  Surgeon: Ascencion Dike, MD;  Location: [facility] OR;  Service: ENT;  Laterality: N/A;  . WISDOM TOOTH EXTRACTION  2013    Current Outpatient Medications on File Prior to Visit  Medication Sig Dispense Refill  . ARIPiprazole (ABILIFY) 5 MG tablet Take 1 tablet  (5 mg total) by mouth daily. 30 tablet 2  . dicyclomine (BENTYL) 20 MG tablet Take 1 tablet (20 mg total) by mouth 3 (three) times daily with meals as needed for spasms. 20 tablet 0  . fluconazole (DIFLUCAN) 150 MG tablet Take 150 mg by mouth once.    . Ibuprofen-diphenhydrAMINE Cit (ADVIL PM PO) Take 1 tablet by mouth at bedtime as needed (sleep/pain).    Marland Kitchen lamoTRIgine (LAMICTAL) 25 MG tablet Take 1 tablet (25 mg total) by mouth 2 (two) times daily. Stop if rash seen 60 tablet 2  . norelgestromin-ethinyl estradiol (ORTHO EVRA) 150-35 MCG/24HR transdermal patch Place 1 patch onto the skin once a week. 3 patch 12  . omeprazole (PRILOSEC) 20 MG capsule Take 1 capsule (20 mg total) by mouth daily. 14 capsule 0  . ondansetron (ZOFRAN ODT) 4 MG disintegrating tablet Take 1 tablet (4 mg total) by mouth every 6 (six) hours as needed for nausea or vomiting. 10 tablet 0  . diclofenac sodium (VOLTAREN) 1 % GEL Apply 2 g topically 4 (four) times daily. (Patient not taking: Reported on 02/19/2019) 100 g 0  . sucralfate (CARAFATE) 1 g tablet Take 1 tablet (1 g total) by mouth 4 (four) times daily -  with meals and  at bedtime for 14 days. (Patient not taking: Reported on 02/24/2019) 56 tablet 0   No current facility-administered medications on file prior to visit.     Allergies  Allergen Reactions  . Bactrim [Sulfamethoxazole-Trimethoprim] Rash    Social History:  reports that she has been smoking. She has never used smokeless tobacco. She reports current drug use. Drug: Marijuana. She reports that she does not drink alcohol.  Family History  Problem Relation Age of Onset  . Arthritis Mother   . Bronchitis Mother   . Asthma Mother   . Hearing loss Paternal Grandfather   . Diabetes Neg Hx   . Stomach cancer Neg Hx   . Colon cancer Neg Hx     The following portions of the patient's history were reviewed and updated as appropriate: allergies, current medications, past family history, past medical  history, past social history, past surgical history and problem list.  Review of Systems Pertinent items noted in HPI and remainder of comprehensive ROS otherwise negative.  Physical Exam:  BP 131/80   Pulse 74   Temp 98.5 F (36.9 C)   Ht 5' (1.524 m)   Wt 108 lb 4.8 oz (49.1 kg)   LMP 02/01/2019 (Exact Date)   BMI 21.15 kg/m  CONSTITUTIONAL: Well-developed, well-nourished female in no acute distress.  HENT:  Normocephalic, atraumatic, External right and left ear normal. Oropharynx is clear and moist EYES: Conjunctivae and EOM are normal. Pupils are equal, round, and reactive to light. No scleral icterus.  NECK: Normal range of motion, supple, no masses.  Normal thyroid.      1. Encounter for initial prescription of injectable contraceptive  Depo Q 3 months Discussed side effects.    Please refer to After Visit Summary for other counseling recommendations.     Kyleigha Markert, Harolyn RutherfordJennifer I, NP  Faculty Practice Center for [facility], [facility]

## 2019-03-15 ENCOUNTER — Telehealth: Payer: Medicaid Other | Admitting: Physician Assistant

## 2019-03-15 ENCOUNTER — Encounter: Payer: Self-pay | Admitting: Physician Assistant

## 2019-03-15 DIAGNOSIS — N898 Other specified noninflammatory disorders of vagina: Secondary | ICD-10-CM

## 2019-03-15 NOTE — Progress Notes (Signed)
Based on what you shared with me, I feel your condition warrants further evaluation and I recommend that you be seen for a face to face office visit.  NOTE: If you entered your credit card information for this eVisit, you will not be charged. You may see a "hold" on your card for the $35 but that hold will drop off and you will not have a charge processed.  Leah [name],  It is recommended that you have a face to face evaluation or call your doctor's office as STD isnt treated through Evisit.  If you are having a true medical emergency please call 911.     For an urgent face to face visit, [facility] has five urgent care centers for your convenience:   [email or web address] to reserve your spot online an avoid wait times  [place], [place], [place] [address] *Just off [address], across the road from [place] hours of operation: Monday-Friday, 12 PM to 6 PM  Closed Saturday & Sunday    . [facility]    [phone]                  Get Driving Directions  [address], [place] [address] . 10 am to 8 pm Monday-Friday . 12 pm to 8 pm Saturday-Sunday   . [facility] at [place]                  Get Driving Directions  [address], [place], [place] [address] . 8 am to 8 pm Monday-Friday . 9 am to 6 pm Saturday . 11 am to 6 pm Sunday   . [facility] at [place]                  Get Driving Directions   [address] [place], [place] [address] . 8 am to 8 pm Monday-Friday . 8 am to 4 pm Saturday-Sunday    . [facility] at [place]                    Get Driving Directions  [phone]  [address] [place],  [address]  . Monday-Friday, 12 PM to 6 PM    Your e-visit answers were reviewed by a board certified advanced clinical  practitioner to complete your personal care plan.  Thank you for using e-Visits. I spent 5-10 minutes on review and completion of this note- Lacy Duverney [facility]

## 2019-03-31 ENCOUNTER — Encounter (HOSPITAL_COMMUNITY): Payer: Self-pay | Admitting: *Deleted

## 2019-03-31 ENCOUNTER — Encounter (HOSPITAL_COMMUNITY): Payer: Self-pay

## 2019-04-08 ENCOUNTER — Ambulatory Visit (INDEPENDENT_AMBULATORY_CARE_PROVIDER_SITE_OTHER): Payer: Medicaid Other | Admitting: Lactation Services

## 2019-04-08 ENCOUNTER — Other Ambulatory Visit: Payer: Self-pay

## 2019-04-08 DIAGNOSIS — N898 Other specified noninflammatory disorders of vagina: Secondary | ICD-10-CM

## 2019-04-08 MED ORDER — METRONIDAZOLE 1 % EX GEL: Freq: Every day | CUTANEOUS | 0 refills | Status: DC

## 2019-04-08 MED ORDER — TINIDAZOLE 500 MG PO TABS: 2.0000 g | ORAL_TABLET | Freq: Every day | ORAL | 0 refills | Status: AC

## 2019-04-08 NOTE — Progress Notes (Signed)
Pt reports she feels like she has Trich and BV and that when she took her ATB she threw it up for several days . She tried to take with food and it did not work. She did get Zofran and by that time she was 5 days into the prescription and it did help keep the meds down.   Pt reports she had the same symptoms and does not feel like she was treated since she could not hold down meds. She denies Alcohol use and sexual intercourse. Discharge is yellow with foul odor.   Sent in Tindamax and Metrogel per protocol.

## 2019-04-29 ENCOUNTER — Other Ambulatory Visit: Payer: Self-pay

## 2019-04-29 ENCOUNTER — Ambulatory Visit (INDEPENDENT_AMBULATORY_CARE_PROVIDER_SITE_OTHER): Payer: Medicaid Other

## 2019-04-29 ENCOUNTER — Other Ambulatory Visit (HOSPITAL_COMMUNITY)
Admission: RE | Admit: 2019-04-29 | Discharge: 2019-04-29 | Disposition: A | Discharge status: Home / Self Care | Payer: Medicaid Other | Source: Ambulatory Visit | Attending: Family Medicine | Admitting: Family Medicine

## 2019-04-29 DIAGNOSIS — Z113 Encounter for screening for infections with a predominantly sexual mode of transmission: Secondary | ICD-10-CM

## 2019-04-29 NOTE — Progress Notes (Signed)
I agree with the nurses note and plan of care.  Lezlie Lye, NP 04/29/2019 3:27 PM

## 2019-04-29 NOTE — Progress Notes (Signed)
Pt here today for test of cure self-swab. Tested positive for Trichomonas, yeast infection, and BV on 02/10/19; unable to complete antibiotics due to nausea. Pt reports same symptoms on 04/08/19 and was prescribed antibiotic again. Pt reports taking Tindamax but states pharmacy was not able to fill Metrogel order. Denies any abnormal discharge, pain, itching, or foul odor today. Self-swab directions given and specimen obtained. Notified pt we will only call with abnormal results. Pt verbalizes understanding.  Pt reports occasional spotting since 04/17/19. She began Depo provera on 02/24/19. Reports regular periods before beginning Depo. Explained to pt that some breakthrough bleeding can occur, especially in the first few months of beginning birth control. Pt would like to continue on this form of birth control at this time and does not have any further questions.   Apolonio Schneiders RN 04/29/19

## 2019-05-05 ENCOUNTER — Telehealth: Payer: Self-pay | Admitting: Lactation Services

## 2019-05-05 DIAGNOSIS — N898 Other specified noninflammatory disorders of vagina: Secondary | ICD-10-CM

## 2019-05-05 LAB — CERVICOVAGINAL ANCILLARY ONLY
Bacterial Vaginitis (gardnerella): POSITIVE — AB
Candida Glabrata: NEGATIVE
Candida Vaginitis: NEGATIVE
Chlamydia: NEGATIVE
Comment: NEGATIVE
Comment: NEGATIVE
Comment: NEGATIVE
Comment: NEGATIVE
Comment: NEGATIVE
Comment: NORMAL
Neisseria Gonorrhea: NEGATIVE
Trichomonas: NEGATIVE

## 2019-05-05 NOTE — Telephone Encounter (Signed)
Pt called in requesting her results. Called cytology and results are pending.  Called pt back and let her know results are delayed and not ready yet and she will be called once results are back with any abnormal results. Pt voiced understanding.

## 2019-05-06 ENCOUNTER — Other Ambulatory Visit: Payer: Self-pay

## 2019-05-06 ENCOUNTER — Telehealth: Payer: Self-pay

## 2019-05-06 DIAGNOSIS — B9689 Other specified bacterial agents as the cause of diseases classified elsewhere: Secondary | ICD-10-CM

## 2019-05-06 DIAGNOSIS — N760 Acute vaginitis: Secondary | ICD-10-CM

## 2019-05-06 MED ORDER — METRONIDAZOLE 500 MG PO TABS: 500.0000 mg | ORAL_TABLET | Freq: Two times a day (BID) | ORAL | 0 refills | Status: DC

## 2019-05-06 NOTE — Telephone Encounter (Signed)
Called pt to advise tested positive for BV & advised that Flagyl was sent to pharmacy, pt states that she can't take flagy without vomiting, stated told Dr. Keane Scrape. That she had BV and he sent Metrogel in September but pharmacy would not release for some reason. So I called [facility] to cancel the Flagyl & asked about the Metrogel, they re-processed and it went thru, advised pt will be able to pick up Rx Friday. The price will be $3.00, Pt verbalized understanding.

## 2019-05-07 ENCOUNTER — Encounter: Payer: Self-pay | Admitting: Family Medicine

## 2019-05-07 ENCOUNTER — Other Ambulatory Visit: Payer: Self-pay | Admitting: Obstetrics and Gynecology

## 2019-05-12 ENCOUNTER — Other Ambulatory Visit: Payer: Self-pay

## 2019-05-12 ENCOUNTER — Ambulatory Visit (INDEPENDENT_AMBULATORY_CARE_PROVIDER_SITE_OTHER): Payer: Medicaid Other | Admitting: Emergency Medicine

## 2019-05-12 DIAGNOSIS — N760 Acute vaginitis: Secondary | ICD-10-CM

## 2019-05-12 DIAGNOSIS — Z3042 Encounter for surveillance of injectable contraceptive: Secondary | ICD-10-CM | POA: Diagnosis not present

## 2019-05-12 DIAGNOSIS — B9689 Other specified bacterial agents as the cause of diseases classified elsewhere: Secondary | ICD-10-CM | POA: Diagnosis not present

## 2019-05-12 MED ORDER — MEDROXYPROGESTERONE ACETATE 150 MG/ML IM SUSP
150.0000 mg | Freq: Once | INTRAMUSCULAR | Status: AC
  Administered 2019-05-12: 14:00:00 150 mg via INTRAMUSCULAR

## 2019-05-12 MED ORDER — METRONIDAZOLE 0.75 % VA GEL: VAGINAL | 6 refills | Status: DC

## 2019-05-12 NOTE — Progress Notes (Signed)
Leah [name] here for Depo-Provera  Injection.  Injection administered without complication. Patient will return in 3 months for next injection.  Pt reports not being able to use the previous prescription for metrogel sent to her pharmacy. Pt states "it was the wrong medication". Pt denies having symptoms of bacterial vaginitis but due to recent chronic infections she requests a new prescription be sent to her pharmacy. Discussed with Almyra Free, [place] and prescription was sent as requested. Pt was informed of order and had no further questions or concerns.  Loma Sousa, RN 05/12/19   [phone]

## 2019-05-13 NOTE — Progress Notes (Signed)
Patient seen and assessed by nursing staff during this encounter. I have reviewed the chart and agree with the documentation and plan.  Kerry Hough, PA-C 05/13/2019 12:19 PM

## 2019-06-10 ENCOUNTER — Telehealth: Payer: Self-pay | Admitting: Family Medicine

## 2019-06-10 NOTE — Telephone Encounter (Signed)
Patient no longer want to get the Depo Injection. Would like to switch to patches.

## 2019-06-14 NOTE — Telephone Encounter (Signed)
Called pt and pt stated that she remembered that her Rx for the patches was still valid.  I informed pt that yes, they are still good and last until a year.  Pt verbalized understanding with no further questions.

## 2019-07-06 DIAGNOSIS — Z20828 Contact with and (suspected) exposure to other viral communicable diseases: Secondary | ICD-10-CM | POA: Diagnosis not present

## 2019-07-28 ENCOUNTER — Ambulatory Visit: Payer: Medicaid Other

## 2019-07-28 ENCOUNTER — Emergency Department (HOSPITAL_COMMUNITY): Payer: Medicaid Other

## 2019-07-28 ENCOUNTER — Other Ambulatory Visit: Payer: Self-pay

## 2019-07-28 ENCOUNTER — Emergency Department (HOSPITAL_COMMUNITY)
Admission: EM | Admit: 2019-07-28 | Discharge: 2019-07-28 | Disposition: A | Discharge status: Home / Self Care | Payer: Medicaid Other | Attending: Emergency Medicine | Admitting: Emergency Medicine

## 2019-07-28 DIAGNOSIS — Z79899 Other long term (current) drug therapy: Secondary | ICD-10-CM | POA: Diagnosis not present

## 2019-07-28 DIAGNOSIS — R21 Rash and other nonspecific skin eruption: Secondary | ICD-10-CM | POA: Insufficient documentation

## 2019-07-28 DIAGNOSIS — R5383 Other fatigue: Secondary | ICD-10-CM | POA: Insufficient documentation

## 2019-07-28 DIAGNOSIS — F17200 Nicotine dependence, unspecified, uncomplicated: Secondary | ICD-10-CM | POA: Insufficient documentation

## 2019-07-28 DIAGNOSIS — R519 Headache, unspecified: Secondary | ICD-10-CM | POA: Diagnosis not present

## 2019-07-28 DIAGNOSIS — R0602 Shortness of breath: Secondary | ICD-10-CM | POA: Diagnosis not present

## 2019-07-28 DIAGNOSIS — R05 Cough: Secondary | ICD-10-CM | POA: Diagnosis not present

## 2019-07-28 DIAGNOSIS — F1210 Cannabis abuse, uncomplicated: Secondary | ICD-10-CM | POA: Insufficient documentation

## 2019-07-28 DIAGNOSIS — Z20822 Contact with and (suspected) exposure to covid-19: Secondary | ICD-10-CM | POA: Diagnosis not present

## 2019-07-28 DIAGNOSIS — M7918 Myalgia, other site: Secondary | ICD-10-CM | POA: Insufficient documentation

## 2019-07-28 NOTE — ED Triage Notes (Signed)
Patient reports non-productive cough, headache, shob and fatigue x 2 days. Denies any fever, abdominal pain, or decrease PO intake. Denies any COVID and sick contacts.

## 2019-07-28 NOTE — ED Provider Notes (Signed)
[facility]-EMERGENCY DEPT Provider Note   CSN: [id number] Arrival date & time: 07/28/19  1403     History Chief Complaint  Patient presents with  . Cough  . Fatigue    Leah [name] is a 24 y.o. female with a past medical history of pseudoseizures, bipolar 1, who presents today for evaluation of nonproductive cough, headache, myalgias, shortness of breath, and fatigue for 2 days.  Leah [name] denies any dysuria, no increased frequency or urgency.  No nausea, vomiting, or diarrhea.  Denies any known sick contacts.  Leah [name] lives at home with her son who Leah [name] reports is asymptomatic.  Leah [name] does report a rash on her left sided collar bone.  Leah [name] denies any known new exposures.  It is nonpruritic.  Leah [name] states that it is getting better from yesterday.  HPI     Past Medical History:  Diagnosis Date  . Anemia 2013  . Anxiety 2011   no meds during pregnancy  . Bipolar 1 disorder (HCC)   . Chlamydia   . Gall stones   . GERD (gastroesophageal reflux disease)   . 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 Seizure [facility]) 2011   pseudoseizures    Patient Active Problem List   Diagnosis Date Noted  . Severe recurrent major depression without psychotic features (HCC) 12/31/2018  . S/P laparoscopic cholecystectomy 10/22/2017  . Pelvic pain 09/12/2017  . Amenorrhea, secondary 09/12/2017  . 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 UTI (urinary tract infection) 08/19/2016  . Suicide attempt by drug ingestion (HCC)   . Contraception 03/10/2014  . Biliary colic 03/10/2014  . BV (bacterial vaginosis) 03/10/2014  . Depression 03/16/2013  . Allergic rhinitis, seasonal 01/26/2013  . Anemia, iron deficiency 12/01/2012    Past Surgical History:  Procedure Laterality Date  . CHOLECYSTECTOMY N/A 09/28/2014   Procedure: LAPAROSCOPIC CHOLECYSTECTOMY;  Surgeon: Axel Filler, MD;  Location: [facility] OR;  Service: General;   Laterality: N/A;  . CHOLECYSTECTOMY    . DIRECT LARYNGOSCOPY N/A 04/24/2014   Procedure: DIRECT LARYNGOSCOPY;  Surgeon: [name], MD;  Location: [facility] OR;  Service: ENT;  Laterality: N/A;  . FOREIGN BODY REMOVAL ESOPHAGEAL N/A 04/24/2014   Procedure: REMOVAL FOREIGN BODY ESOPHAGEAL;  Surgeon: [name], MD;  Location: [facility] OR;  Service: ENT;  Laterality: N/A;  . WISDOM TOOTH EXTRACTION  2013     OB History    Gravida  2   Para  1   Term  1   Preterm  0   AB  1   Living  1     SAB  1   TAB  0   Ectopic  0   Multiple      Live Births  1           Family History  Problem Relation Age of Onset  . Arthritis Mother   . Bronchitis Mother   . Asthma Mother   . Hearing loss Paternal Grandfather   . Diabetes Neg Hx   . Stomach cancer Neg Hx   . Colon cancer Neg Hx     Social History   Tobacco Use  . Smoking status: Light Tobacco Smoker  . Smokeless tobacco: Never Used  . Tobacco comment: marjiuna  Substance Use Topics  . Alcohol use: No    Alcohol/week: 6.0 standard drinks    Types: 6 Cans of beer per week  . Drug use: Yes  Types: Marijuana    Comment: occ.    Home Medications Prior to Admission medications   Medication Sig Start Date End Date Taking? Authorizing Provider  ARIPiprazole (ABILIFY) 5 MG tablet Take 1 tablet (5 mg total) by mouth daily. 01/04/19   Malvin Johns, MD  diclofenac sodium (VOLTAREN) 1 % GEL Apply 2 g topically 4 (four) times daily. Patient not taking: Reported on 02/19/2019 01/07/19   Mike Gip, FNP  dicyclomine (BENTYL) 20 MG tablet Take 1 tablet (20 mg total) by mouth 3 (three) times daily with meals as needed for spasms. 02/19/19   Cristina Gong, PA-C  fluconazole (DIFLUCAN) 150 MG tablet Take 150 mg by mouth once. 02/12/19   [provider]  Ibuprofen-diphenhydrAMINE Cit (ADVIL PM PO) Take 1 tablet by mouth at bedtime as needed (sleep/pain).    [provider]  lamoTRIgine (LAMICTAL) 25 MG tablet Take 1  tablet (25 mg total) by mouth 2 (two) times daily. Stop if rash seen 01/04/19   Malvin Johns, MD  metroNIDAZOLE (FLAGYL) 500 MG tablet Take 1 tablet (500 mg total) by mouth 2 (two) times daily. 05/06/19   Rasch, Victorino Dike I, NP  metroNIDAZOLE (METROGEL VAGINAL) 0.75 % vaginal gel Insert 1 applicatorful nightly x 10 nights then twice a week for 6 months 05/12/19   Marny Lowenstein, PA-C  metroNIDAZOLE (METROGEL) 1 % gel Apply topically daily. 04/08/19   Sopchoppy Bing, MD  norelgestromin-ethinyl estradiol (ORTHO EVRA) 150-35 MCG/24HR transdermal patch Place 1 patch onto the skin once a week. 08/10/18   Judeth Horn, NP  omeprazole (PRILOSEC) 20 MG capsule Take 1 capsule (20 mg total) by mouth daily. 02/19/19   Cristina Gong, PA-C  ondansetron (ZOFRAN ODT) 4 MG disintegrating tablet Take 1 tablet (4 mg total) by mouth every 6 (six) hours as needed for nausea or vomiting. 02/19/19   Cristina Gong, PA-C  sucralfate (CARAFATE) 1 g tablet Take 1 tablet (1 g total) by mouth 4 (four) times daily -  with meals and at bedtime for 14 days. Patient not taking: Reported on 02/24/2019 02/19/19 03/05/19  Cristina Gong, PA-C    Allergies    Bactrim [sulfamethoxazole-trimethoprim]  Review of Systems   Review of Systems  Constitutional: Negative for chills and fever.  Respiratory: Positive for cough and shortness of breath. Negative for chest tightness.   Cardiovascular: Negative for chest pain, palpitations and leg swelling.  Gastrointestinal: Negative for abdominal pain, diarrhea, nausea and vomiting.  Genitourinary: Negative for dysuria, flank pain, frequency and urgency.  Musculoskeletal: Negative for back pain and neck pain.  Skin: Positive for rash.  Neurological: Positive for headaches. Negative for weakness and light-headedness.  All other systems reviewed and are negative.   Physical Exam Updated Vital Signs BP (!) 109/93   Pulse 90   Temp 99.6 F (37.6 C) (Oral)   Resp 20    SpO2 97%   Physical Exam Vitals and nursing note reviewed.  Constitutional:      General: Leah [name] is not in acute distress.    Appearance: Leah [name] is well-developed. Leah [name] is not diaphoretic.  HENT:     Head: Normocephalic and atraumatic.  Eyes:     General: No scleral icterus.       Right eye: No discharge.        Left eye: No discharge.     Conjunctiva/sclera: Conjunctivae normal.  Cardiovascular:     Rate and Rhythm: Normal rate and regular rhythm.     Heart sounds: Normal heart  sounds.  Pulmonary:     Effort: Pulmonary effort is normal. No respiratory distress.     Breath sounds: Normal breath sounds. No stridor.  Abdominal:     General: There is no distension.     Tenderness: There is no abdominal tenderness. There is no right CVA tenderness, left CVA tenderness or guarding.  Musculoskeletal:        General: No deformity.     Cervical back: Normal range of motion and neck supple.     Right lower leg: No edema.     Left lower leg: No edema.  Skin:    General: Skin is warm and dry.     Comments: There is a 1cmx2cm area of excoriation present over the left anterior chest with out abnormal surrounding erythema, ecchymosis, drainage, or induration.  Neurological:     General: No focal deficit present.     Mental Status: Leah [name] is alert and oriented to person, place, and time.     Motor: No abnormal muscle tone.  Psychiatric:        Mood and Affect: Mood normal.        Behavior: Behavior normal.     ED Results / Procedures / Treatments   Labs (all labs ordered are listed, but only abnormal results are displayed) Labs Reviewed  NOVEL CORONAVIRUS, NAA (HOSP ORDER, SEND-OUT TO REF LAB; TAT 18-24 HRS)    EKG None  Radiology DG Chest Port 1 View  Result Date: 07/28/2019 CLINICAL DATA:  Cough, shortness of breath, fatigue EXAM: PORTABLE CHEST 1 VIEW COMPARISON:  12/29/2018 FINDINGS: The heart size and mediastinal contours are within normal limits. Both lungs are clear. The visualized  skeletal structures are unremarkable. IMPRESSION: No acute abnormality of the lungs in AP portable projection. Electronically Signed   By: Lauralyn Primes M.D.   On: 07/28/2019 14:55    Procedures Procedures (including critical care time)  Medications Ordered in ED Medications - No data to display  ED Course  I have reviewed the triage vital signs and the nursing notes.  Pertinent labs & imaging results that were available during my care of the patient were reviewed by me and considered in my medical decision making (see chart for details).  While in room I personally had patient ambulate on pulse oximetry without desaturations, SPO2 100% on room air.    MDM Rules/Calculators/A&P                     Patient presents today for evaluation of 2 days of cough, headaches, myalgias/arthralgias, fatigue, and shortness of breath.  Lungs are clear to auscultation bilaterally.  Leah [name] is not tachycardic.  Leah [name] is not tachypneic.  Leah [name] is afebrile and not 97 to 100% on room air, even with trial of ambulation.  Chest x-ray is obtained without evidence of acute abnormalities.  No significant edema to bilateral lower extremities.  Leah [name] does have a slight rash on her chest, which appears to be dry skin with secondary excoriation however reports that it is improving without treatment since yesterday, recommended moisturizer, continued observation.  Leah [name] denies any mucous membrane involvement, or other locations with the rash.  Coronavirus testing was sent.  Leah [name] is instructed to quarantine herself at home.  Based on her constellations of symptoms I instructed her that even if her test is negative Leah [name] needs to obtain repeat testing in 5 to 7 days as I am extremely suspicious for Covid.  Leah [name] was evaluated in Emergency Department on  07/28/2019 for the symptoms described in the history of present illness. Leah [name] was evaluated in the context of the global COVID-19 pandemic, which necessitated consideration that the  patient might be at risk for infection with the SARS-CoV-2 virus that causes COVID-19. Institutional protocols and algorithms that pertain to the evaluation of patients at risk for COVID-19 are in a state of rapid change based on information released by regulatory bodies including the CDC and federal and state organizations. These policies and algorithms were followed during the patient's care in the ED.  Return precautions were discussed with patient who states their understanding.  At the time of discharge patient denied any unaddressed complaints or concerns.  Patient is agreeable for discharge home.  Note: Portions of this report may have been transcribed using voice recognition software. Every effort was made to ensure accuracy; however, inadvertent computerized transcription errors may be present  Final Clinical Impression(s) / ED Diagnoses Final diagnoses:  Suspected COVID-19 virus infection    Rx / DC Orders ED Discharge Orders    None       Ollen Gross 07/28/19 1514    Lacretia Leigh, MD 08/02/19 1202

## 2019-07-28 NOTE — Discharge Instructions (Signed)
Please take Tylenol (acetaminophen) to relieve your pain.  You may take tylenol, up to 1,000 mg (two extra strength pills).  Do not take more than 3,000 mg tylenol in a 24 hour period.  Please check all medication labels as many medications such as pain and cold medications may contain tylenol. Please do not drink alcohol while taking this medication.  ° °

## 2019-07-28 NOTE — ED Notes (Signed)
Pt verbalizes understanding of DC instructions. Pt belongings returned and is ambulatory out of ED.  

## 2019-07-29 LAB — NOVEL CORONAVIRUS, NAA (HOSP ORDER, SEND-OUT TO REF LAB; TAT 18-24 HRS): SARS-CoV-2, NAA: NOT DETECTED

## 2019-08-04 ENCOUNTER — Encounter: Payer: Self-pay | Admitting: Certified Nurse Midwife

## 2019-08-04 ENCOUNTER — Ambulatory Visit (INDEPENDENT_AMBULATORY_CARE_PROVIDER_SITE_OTHER): Payer: Medicaid Other | Admitting: Certified Nurse Midwife

## 2019-08-04 ENCOUNTER — Other Ambulatory Visit: Payer: Self-pay

## 2019-08-04 VITALS — BP 122/83 | HR 72 | Wt 137.0 lb

## 2019-08-04 DIAGNOSIS — Z30017 Encounter for initial prescription of implantable subdermal contraceptive: Secondary | ICD-10-CM | POA: Diagnosis not present

## 2019-08-04 LAB — POCT PREGNANCY, URINE: Preg Test, Ur: NEGATIVE

## 2019-08-04 MED ORDER — ETONOGESTREL 68 MG ~~LOC~~ IMPL: 68.0000 mg | DRUG_IMPLANT | Freq: Once | SUBCUTANEOUS | Status: DC

## 2019-08-04 MED ORDER — ETONOGESTREL 68 MG ~~LOC~~ IMPL
68.0000 mg | DRUG_IMPLANT | Freq: Once | SUBCUTANEOUS | Status: AC
  Administered 2019-08-04: 11:00:00 68 mg via SUBCUTANEOUS

## 2019-08-04 NOTE — Patient Instructions (Signed)
Nexplanon Instructions After Insertion   Keep bandage clean and dry for 24 hours   May use ice/Tylenol/Ibuprofen for soreness or pain   If you develop fever, drainage or increased warmth from incision site-contact office immediately  Etonogestrel implant What is this medicine? ETONOGESTREL (et oh noe JES trel) is a contraceptive (birth control) device. It is used to prevent pregnancy. It can be used for up to 3 years. This medicine may be used for other purposes; ask your health care provider or pharmacist if you have questions. COMMON BRAND NAME(S): Implanon, Nexplanon What should I tell my health care provider before I take this medicine? They need to know if you have any of these conditions:  abnormal vaginal bleeding  blood vessel disease or blood clots  breast, cervical, endometrial, ovarian, liver, or uterine cancer  diabetes  gallbladder disease  heart disease or recent heart attack  high blood pressure  high cholesterol or triglycerides  kidney disease  liver disease  migraine headaches  seizures  stroke  tobacco smoker  an unusual or allergic reaction to etonogestrel, anesthetics or antiseptics, other medicines, foods, dyes, or preservatives  pregnant or trying to get pregnant  breast-feeding How should I use this medicine? This device is inserted just under the skin on the inner side of your upper arm by a health care professional. Talk to your pediatrician regarding the use of this medicine in children. Special care may be needed. Overdosage: If you think you have taken too much of this medicine contact a poison control center or emergency room at once. NOTE: This medicine is only for you. Do not share this medicine with others. What if I miss a dose? This does not apply. What may interact with this medicine? Do not take this medicine with any of the following medications:  amprenavir  fosamprenavir This medicine may also interact with the  following medications:  acitretin  aprepitant  armodafinil  bexarotene  bosentan  carbamazepine  certain medicines for fungal infections like fluconazole, ketoconazole, itraconazole and voriconazole  certain medicines to treat hepatitis, HIV or AIDS  cyclosporine  felbamate  griseofulvin  lamotrigine  modafinil  oxcarbazepine  phenobarbital  phenytoin  primidone  rifabutin  rifampin  rifapentine  St. John's wort  topiramate This list may not describe all possible interactions. Give your health care provider a list of all the medicines, herbs, non-prescription drugs, or dietary supplements you use. Also tell them if you smoke, drink alcohol, or use illegal drugs. Some items may interact with your medicine. What should I watch for while using this medicine? This product does not protect you against HIV infection (AIDS) or other sexually transmitted diseases. You should be able to feel the implant by pressing your fingertips over the skin where it was inserted. Contact your doctor if you cannot feel the implant, and use a non-hormonal birth control method (such as condoms) until your doctor confirms that the implant is in place. Contact your doctor if you think that the implant may have broken or become bent while in your arm. You will receive a user card from your health care provider after the implant is inserted. The card is a record of the location of the implant in your upper arm and when it should be removed. Keep this card with your health records. What side effects may I notice from receiving this medicine? Side effects that you should report to your doctor or health care professional as soon as possible:  allergic reactions like skin   rash, itching or hives, swelling of the face, lips, or tongue  breast lumps, breast tissue changes, or discharge  breathing problems  changes in emotions or moods  coughing up blood  if you feel that the implant may  have broken or bent while in your arm  high blood pressure  pain, irritation, swelling, or bruising at the insertion site  scar at site of insertion  signs of infection at the insertion site such as fever, and skin redness, pain or discharge  signs and symptoms of a blood clot such as breathing problems; changes in vision; chest pain; severe, sudden headache; pain, swelling, warmth in the leg; trouble speaking; sudden numbness or weakness of the face, arm or leg  signs and symptoms of liver injury like dark yellow or brown urine; general ill feeling or flu-like symptoms; light-colored stools; loss of appetite; nausea; right upper belly pain; unusually weak or tired; yellowing of the eyes or skin  unusual vaginal bleeding, discharge Side effects that usually do not require medical attention (report to your doctor or health care professional if they continue or are bothersome):  acne  breast pain or tenderness  headache  irregular menstrual bleeding  nausea This list may not describe all possible side effects. Call your doctor for medical advice about side effects. You may report side effects to FDA at 1-800-FDA-1088. Where should I keep my medicine? This drug is given in a hospital or clinic and will not be stored at home. NOTE: This sheet is a summary. It may not cover all possible information. If you have questions about this medicine, talk to your doctor, pharmacist, or health care provider.  2020 Elsevier/Gold Standard (2019-04-20 11:33:04)  

## 2019-08-04 NOTE — Progress Notes (Signed)
GYNECOLOGY CLINIC PROCEDURE NOTE  [name] is a 24 y.o. G2P1011 here for Nexplanon insertion. No GYN concerns.  Last pap smear was on 09/2017 and was normal.  No other gynecologic concerns.  Nexplanon Insertion Procedure Patient was given informed consent, she signed consent form.  Patient does understand that irregular bleeding is a very common side effect of this medication. She was advised to have backup contraception for one week after placement. Pregnancy test in clinic today was negative.  Appropriate time out taken.  Patient's left arm was prepped and draped in the usual sterile fashion.. The ruler used to measure and mark insertion area.  Patient was prepped with alcohol swab and then injected with 3 ml of 1% lidocaine.  She was prepped with betadine, Nexplanon removed from packaging,  Device confirmed in needle, then inserted full length of needle and withdrawn per handbook instructions. Nexplanon was able to palpated in the patient's arm; patient palpated the insert herself. There was minimal blood loss.  Patient insertion site covered with guaze and a pressure bandage to reduce any bruising.  The patient tolerated the procedure well and was given post procedure instructions.   Sharyon Cable, CNM 08/04/2019 11:08 AM

## 2019-08-04 NOTE — Progress Notes (Signed)
Patient here for Nexplanon insertion.

## 2019-08-05 DIAGNOSIS — F331 Major depressive disorder, recurrent, moderate: Secondary | ICD-10-CM | POA: Diagnosis not present

## 2019-08-05 DIAGNOSIS — F319 Bipolar disorder, unspecified: Secondary | ICD-10-CM | POA: Diagnosis not present

## 2019-08-17 ENCOUNTER — Encounter: Payer: Self-pay | Admitting: General Practice

## 2019-08-20 ENCOUNTER — Ambulatory Visit (INDEPENDENT_AMBULATORY_CARE_PROVIDER_SITE_OTHER): Payer: Medicaid Other | Admitting: *Deleted

## 2019-08-20 ENCOUNTER — Other Ambulatory Visit (HOSPITAL_COMMUNITY)
Admission: RE | Admit: 2019-08-20 | Discharge: 2019-08-20 | Disposition: A | Discharge status: Home / Self Care | Payer: Medicaid Other | Source: Ambulatory Visit | Attending: Family Medicine | Admitting: Family Medicine

## 2019-08-20 ENCOUNTER — Encounter: Payer: Self-pay | Admitting: *Deleted

## 2019-08-20 ENCOUNTER — Other Ambulatory Visit: Payer: Self-pay

## 2019-08-20 VITALS — BP 126/80 | HR 76 | Wt 138.5 lb

## 2019-08-20 DIAGNOSIS — Z7251 High risk heterosexual behavior: Secondary | ICD-10-CM | POA: Insufficient documentation

## 2019-08-20 NOTE — Progress Notes (Signed)
Pt states she has a new sex partner for the past 3 weeks and desires testing for STI. Pt states she comes to office for this type of testing any time she has a new partner. She denies having vaginal discharge or sx of infection. Pt has Nexplanon for birth control and is not using condoms. Self swab was obtained and she will be notified of results via MyChart.  Pt agreed to plan of care and voiced understanding.

## 2019-08-20 NOTE — Progress Notes (Signed)
Patient seen and assessed by nursing staff.  Agree with documentation and plan.  

## 2019-08-23 LAB — CERVICOVAGINAL ANCILLARY ONLY
Bacterial Vaginitis (gardnerella): NEGATIVE
Candida Glabrata: NEGATIVE
Candida Vaginitis: NEGATIVE
Chlamydia: NEGATIVE
Comment: NEGATIVE
Comment: NEGATIVE
Comment: NEGATIVE
Comment: NEGATIVE
Comment: NEGATIVE
Comment: NORMAL
Neisseria Gonorrhea: NEGATIVE
Trichomonas: NEGATIVE

## 2019-09-14 DIAGNOSIS — F331 Major depressive disorder, recurrent, moderate: Secondary | ICD-10-CM | POA: Diagnosis not present

## 2019-09-22 ENCOUNTER — Encounter (HOSPITAL_COMMUNITY): Payer: Self-pay | Admitting: Emergency Medicine

## 2019-09-22 ENCOUNTER — Other Ambulatory Visit: Payer: Self-pay

## 2019-09-22 ENCOUNTER — Emergency Department (HOSPITAL_COMMUNITY)
Admission: EM | Admit: 2019-09-22 | Discharge: 2019-09-22 | Disposition: A | Discharge status: Home / Self Care | Payer: Medicaid Other | Attending: Emergency Medicine | Admitting: Emergency Medicine

## 2019-09-22 DIAGNOSIS — F17200 Nicotine dependence, unspecified, uncomplicated: Secondary | ICD-10-CM | POA: Insufficient documentation

## 2019-09-22 DIAGNOSIS — M7918 Myalgia, other site: Secondary | ICD-10-CM | POA: Insufficient documentation

## 2019-09-22 DIAGNOSIS — R112 Nausea with vomiting, unspecified: Secondary | ICD-10-CM

## 2019-09-22 DIAGNOSIS — F1210 Cannabis abuse, uncomplicated: Secondary | ICD-10-CM | POA: Insufficient documentation

## 2019-09-22 DIAGNOSIS — R519 Headache, unspecified: Secondary | ICD-10-CM | POA: Insufficient documentation

## 2019-09-22 DIAGNOSIS — R05 Cough: Secondary | ICD-10-CM | POA: Diagnosis not present

## 2019-09-22 DIAGNOSIS — R1110 Vomiting, unspecified: Secondary | ICD-10-CM | POA: Diagnosis not present

## 2019-09-22 DIAGNOSIS — Z79899 Other long term (current) drug therapy: Secondary | ICD-10-CM | POA: Insufficient documentation

## 2019-09-22 DIAGNOSIS — Z20822 Contact with and (suspected) exposure to covid-19: Secondary | ICD-10-CM | POA: Diagnosis not present

## 2019-09-22 DIAGNOSIS — R059 Cough, unspecified: Secondary | ICD-10-CM

## 2019-09-22 LAB — SARS CORONAVIRUS 2 (TAT 6-24 HRS): SARS Coronavirus 2: NEGATIVE

## 2019-09-22 MED ORDER — ONDANSETRON 4 MG PO TBDP: 4.0000 mg | ORAL_TABLET | Freq: Three times a day (TID) | ORAL | 0 refills | Status: DC | PRN

## 2019-09-22 NOTE — Discharge Instructions (Signed)
Please use Zofran as needed for nausea and vomiting.  Dissolved under tongue.  Your Covid test will result in 4 hours.  Please follow with your PCP as needed.  May return to ED if you have any new or concerning symptoms.

## 2019-09-22 NOTE — ED Triage Notes (Signed)
Patient presents with cough, emesis and headaches for one week. She also states she had a covid exposure a couple days ago.

## 2019-09-22 NOTE — ED Provider Notes (Signed)
[name] DEPT Provider Note   CSN: [id number] Arrival date & time: 09/22/19  1139     History Chief Complaint  Patient presents with  . Emesis  . Cough  . Headache    [name] is a 24 y.o. female.  The history is provided by the patient.  Cough Cough characteristics:  Dry Severity:  Mild Onset quality:  Gradual Duration:  1 week Timing:  Constant Progression:  Unchanged Chronicity:  New Associated symptoms: headaches and myalgias   Associated symptoms: no chest pain, no chills, no diaphoresis, no fever, no rash, no shortness of breath, no sore throat and no weight loss     Patient is a 24 year old female with no significant past medical history presented today for headache, cough, emesis and she aches for 1 week.  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  She states her manager tested positive several days ago but she has been around her Freight forwarder and her manager has been symptomatic for at least a week.  She states she feels very tired and has vomited several times.  Denies any chest pain, shortness of breath, leg pain, leg swelling, recent hospitalization, immobilization, long travel.  She has no pertinent past medical history other than history of seizures.  Patient describes the headaches as generalized.  Was unable to point to any specific area.  Does describe it as bandlike when questioned.  States she has had decreased appetite not been drinking very much water.  She does endorse retained smell and taste however.    Past Medical History:  Diagnosis Date  . Anemia 2013  . Anxiety 2011   no meds during pregnancy  . Bipolar 1 disorder ([place])   . Chlamydia   . Gall stones   . GERD (gastroesophageal reflux disease)   . 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 Seizure [facility]) 2011   pseudoseizures    Patient Active Problem List   Diagnosis Date Noted  . Severe recurrent major depression  without psychotic features ([place]) 12/31/2018  . S/P laparoscopic cholecystectomy 10/22/2017  . Pelvic pain 09/12/2017  . Amenorrhea, secondary 09/12/2017  . Major depressive disorder, recurrent severe without psychotic features ([place]) 08/19/2016  . UTI (urinary tract infection) 08/19/2016  . Suicide attempt by drug ingestion ([place])   . Contraception 03/10/2014  . Biliary colic 61/60/7371  . BV (bacterial vaginosis) 03/10/2014  . Depression 03/16/2013  . Allergic rhinitis, seasonal 01/26/2013  . Anemia, iron deficiency 12/01/2012    Past Surgical History:  Procedure Laterality Date  . CHOLECYSTECTOMY N/A 09/28/2014   Procedure: LAPAROSCOPIC CHOLECYSTECTOMY;  Surgeon: Ralene Ok, MD;  Location: [place];  Service: General;  Laterality: N/A;  . CHOLECYSTECTOMY    . DIRECT LARYNGOSCOPY N/A 04/24/2014   Procedure: DIRECT LARYNGOSCOPY;  Surgeon: Ascencion Dike, MD;  Location: [place];  Service: ENT;  Laterality: N/A;  . FOREIGN BODY REMOVAL ESOPHAGEAL N/A 04/24/2014   Procedure: REMOVAL FOREIGN BODY ESOPHAGEAL;  Surgeon: Ascencion Dike, MD;  Location: [place];  Service: ENT;  Laterality: N/A;  . WISDOM TOOTH EXTRACTION  2013     OB History    Gravida  2   Para  1   Term  1   Preterm  0   AB  1   Living  1     SAB  1   TAB  0   Ectopic  0   Multiple  Live Births  1           Family History  Problem Relation Age of Onset  . Arthritis Mother   . Bronchitis Mother   . Asthma Mother   . Hearing loss Paternal Grandfather   . Diabetes Neg Hx   . Stomach cancer Neg Hx   . Colon cancer Neg Hx     Social History   Tobacco Use  . Smoking status: Light Tobacco Smoker  . Smokeless tobacco: Never Used  . Tobacco comment: marjiuna  Substance Use Topics  . Alcohol use: No    Alcohol/week: 6.0 standard drinks    Types: 6 Cans of beer per week  . Drug use: Yes    Types: Marijuana    Comment: occ.    Home Medications Prior to Admission medications   Medication Sig Start  Date End Date Taking? Authorizing Provider  Ibuprofen-diphenhydrAMINE Cit (ADVIL PM PO) Take 1 tablet by mouth at bedtime as needed (sleep/pain).    [provider]  metroNIDAZOLE (METROGEL VAGINAL) 0.75 % vaginal gel Insert 1 applicatorful nightly x 10 nights then twice a week for 6 months 05/12/19   Marny Lowenstein, PA-C  OLANZapine (ZYPREXA) 5 MG tablet Take 5 mg by mouth at bedtime.    [provider]  ondansetron (ZOFRAN ODT) 4 MG disintegrating tablet Take 1 tablet (4 mg total) by mouth every 8 (eight) hours as needed for nausea or vomiting. 09/22/19   Gailen Shelter, PA    Allergies    Bactrim [sulfamethoxazole-trimethoprim]  Review of Systems   Review of Systems  Constitutional: Negative for chills, diaphoresis, fever and weight loss.  HENT: Negative for congestion and sore throat.   Eyes: Negative for pain.  Respiratory: Positive for cough. Negative for shortness of breath.   Cardiovascular: Negative for chest pain and leg swelling.  Gastrointestinal: Negative for abdominal pain and vomiting.  Genitourinary: Negative for dysuria.  Musculoskeletal: Positive for myalgias.  Skin: Negative for rash.  Neurological: Positive for headaches. Negative for dizziness.    Physical Exam Updated Vital Signs BP 126/74   Pulse 80   Temp 98.7 F (37.1 C) (Oral)   Resp 16   SpO2 100%   Physical Exam Vitals and nursing note reviewed.  Constitutional:      General: She is not in acute distress.    Comments: In no acute distress sitting comfortably in bed  HENT:     Head: Normocephalic and atraumatic.     Nose: Nose normal.     Mouth/Throat:     Mouth: Mucous membranes are moist.  Eyes:     General: No scleral icterus. Cardiovascular:     Rate and Rhythm: Normal rate and regular rhythm.     Pulses: Normal pulses.     Heart sounds: Normal heart sounds.  Pulmonary:     Effort: Pulmonary effort is normal. No respiratory distress.     Breath sounds: No wheezing.       Comments: Lungs clear to auscultation bilaterally.  No increased work of breathing. Abdominal:     Palpations: Abdomen is soft.     Tenderness: There is no abdominal tenderness. There is no right CVA tenderness, left CVA tenderness, guarding or rebound.  Musculoskeletal:     Cervical back: Normal range of motion.     Right lower leg: No edema.     Left lower leg: No edema.  Skin:    General: Skin is warm and dry.  Capillary Refill: Capillary refill takes less than 2 seconds.  Neurological:     Mental Status: She is alert. Mental status is at baseline.     Comments: No sensation deficits in any extremities, cranial nerves intact.  Gait without any abnormality.  Psychiatric:        Mood and Affect: Mood normal.        Behavior: Behavior normal.     ED Results / Procedures / Treatments   Labs (all labs ordered are listed, but only abnormal results are displayed) Labs Reviewed  SARS CORONAVIRUS 2 (TAT 6-24 HRS)    EKG None  Radiology No results found.  Procedures Procedures (including critical care time)  Medications Ordered in ED Medications - No data to display  ED Course  I have reviewed the triage vital signs and the nursing notes.  Pertinent labs & imaging results that were available during my care of the patient were reviewed by me and considered in my medical decision making (see chart for details).    MDM Rules/Calculators/A&P                      Patient is presenting to ED today with concerns for Covid exposure.  She has cough, some mild congestion, headaches and fatigue and body aches.  She denies any fever.  Denies any shortness of breath or chest pain.  Doubt pneumonia, lungs are clear to auscultation bilaterally.  She is afebrile.  Doubt any type of bacterial infection.  Doubt sinusitis as patient has no congestion currently or sinus pain.  Doubt PE.  Patient is PERC negative.  Suspect that her symptoms are completely due to COVID-19.  Discussed  with patient she is agreeable to Covid 24 hours and not test.  She is agreeable to no further testing at this time.  She will follow-up with her PCP or return to ED if she has any new or concerning symptoms.  Already p.o. at this time.  No increased work of breathing or dyspnea or hypoxia.  Ambulatory without desaturation on my exam. Discharged with Zofran for nausea.   Leah [name] was evaluated in Emergency Department on 09/22/2019 for the symptoms described in the history of present illness. She was evaluated in the context of the global COVID-19 pandemic, which necessitated consideration that the patient might be at risk for infection with the SARS-CoV-2 virus that causes COVID-19. Institutional protocols and algorithms that pertain to the evaluation of patients at risk for COVID-19 are in a state of rapid change based on information released by regulatory bodies including the CDC and federal and state organizations. These policies and algorithms were followed during the patient's care in the ED.  Final Clinical Impression(s) / ED Diagnoses Final diagnoses:  Cough  Suspected COVID-19 virus infection  Nausea and vomiting, intractability of vomiting not specified, unspecified vomiting type    Rx / DC Orders ED Discharge Orders         Ordered    ondansetron (ZOFRAN ODT) 4 MG disintegrating tablet  Every 8 hours PRN     09/22/19 1248           Solon Augusta [place], [place] 09/22/19 1249    Pricilla Loveless, MD 09/25/19 [phone]

## 2019-09-23 ENCOUNTER — Ambulatory Visit: Payer: Medicaid Other

## 2019-09-24 ENCOUNTER — Other Ambulatory Visit (HOSPITAL_COMMUNITY)
Admission: RE | Admit: 2019-09-24 | Discharge: 2019-09-24 | Disposition: A | Discharge status: Home / Self Care | Payer: Medicaid Other | Source: Ambulatory Visit | Attending: Obstetrics & Gynecology | Admitting: Obstetrics & Gynecology

## 2019-09-24 ENCOUNTER — Ambulatory Visit (INDEPENDENT_AMBULATORY_CARE_PROVIDER_SITE_OTHER): Payer: Medicaid Other | Admitting: *Deleted

## 2019-09-24 ENCOUNTER — Other Ambulatory Visit: Payer: Self-pay

## 2019-09-24 DIAGNOSIS — Z113 Encounter for screening for infections with a predominantly sexual mode of transmission: Secondary | ICD-10-CM

## 2019-09-24 NOTE — Progress Notes (Signed)
Patient ID: [name], female   DOB: 1996-05-31, 24 y.o.   MRN: [id number] Patient seen and assessed by nursing staff during this encounter. I have reviewed the chart and agree with the documentation and plan.  Scheryl Darter, MD 09/24/2019 9:10 AM

## 2019-09-24 NOTE — Progress Notes (Signed)
Here for self swab. Explains she has a new partner and wants to be sure everything is ok. States wants to check for GC, Trich but also BV and yeast because she has had that before. Instructed how to collect self swab and will be contacted early next week  if anything comes back + and needs treatment She voices understanding. Bradley Handyside,RN

## 2019-09-27 LAB — CERVICOVAGINAL ANCILLARY ONLY
Bacterial Vaginitis (gardnerella): NEGATIVE
Candida Glabrata: NEGATIVE
Candida Vaginitis: NEGATIVE
Chlamydia: NEGATIVE
Comment: NEGATIVE
Comment: NEGATIVE
Comment: NEGATIVE
Comment: NEGATIVE
Comment: NEGATIVE
Comment: NORMAL
Neisseria Gonorrhea: NEGATIVE
Trichomonas: NEGATIVE

## 2019-09-28 DIAGNOSIS — F331 Major depressive disorder, recurrent, moderate: Secondary | ICD-10-CM | POA: Diagnosis not present

## 2019-10-04 ENCOUNTER — Other Ambulatory Visit (HOSPITAL_COMMUNITY)
Admission: RE | Admit: 2019-10-04 | Discharge: 2019-10-04 | Disposition: A | Discharge status: Home / Self Care | Payer: Medicaid Other | Source: Ambulatory Visit | Attending: Obstetrics and Gynecology | Admitting: Obstetrics and Gynecology

## 2019-10-04 ENCOUNTER — Other Ambulatory Visit: Payer: Self-pay

## 2019-10-04 ENCOUNTER — Ambulatory Visit (INDEPENDENT_AMBULATORY_CARE_PROVIDER_SITE_OTHER): Payer: Medicaid Other | Admitting: General Practice

## 2019-10-04 DIAGNOSIS — Z113 Encounter for screening for infections with a predominantly sexual mode of transmission: Secondary | ICD-10-CM | POA: Insufficient documentation

## 2019-10-04 NOTE — Progress Notes (Signed)
Patient presents to office today requesting STD testing. Patient was tested 3/5 for STDs and was negative. Patient states she likes to be tested whenever she has a new partner. Patient instructed in self swab & specimen collected. Discussed results will be back in 24-48 hours and will be available via mychart. Recommended using condoms for protection against infections.  Chase Caller RN BSN 10/04/19

## 2019-10-04 NOTE — Progress Notes (Signed)
Agree with A & P. 

## 2019-10-06 LAB — CERVICOVAGINAL ANCILLARY ONLY
Chlamydia: NEGATIVE
Comment: NEGATIVE
Comment: NEGATIVE
Comment: NORMAL
Neisseria Gonorrhea: NEGATIVE
Trichomonas: NEGATIVE

## 2019-10-07 DIAGNOSIS — F331 Major depressive disorder, recurrent, moderate: Secondary | ICD-10-CM | POA: Diagnosis not present

## 2019-10-19 ENCOUNTER — Emergency Department (HOSPITAL_COMMUNITY)
Admission: EM | Admit: 2019-10-19 | Discharge: 2019-10-19 | Disposition: A | Discharge status: Home / Self Care | Payer: Medicaid Other | Attending: Emergency Medicine | Admitting: Emergency Medicine

## 2019-10-19 ENCOUNTER — Encounter (HOSPITAL_COMMUNITY): Payer: Self-pay

## 2019-10-19 ENCOUNTER — Other Ambulatory Visit: Payer: Self-pay

## 2019-10-19 DIAGNOSIS — F1210 Cannabis abuse, uncomplicated: Secondary | ICD-10-CM | POA: Insufficient documentation

## 2019-10-19 DIAGNOSIS — Z79899 Other long term (current) drug therapy: Secondary | ICD-10-CM | POA: Insufficient documentation

## 2019-10-19 DIAGNOSIS — F17200 Nicotine dependence, unspecified, uncomplicated: Secondary | ICD-10-CM | POA: Diagnosis not present

## 2019-10-19 DIAGNOSIS — Z20822 Contact with and (suspected) exposure to covid-19: Secondary | ICD-10-CM | POA: Insufficient documentation

## 2019-10-19 DIAGNOSIS — J069 Acute upper respiratory infection, unspecified: Secondary | ICD-10-CM | POA: Insufficient documentation

## 2019-10-19 DIAGNOSIS — R0981 Nasal congestion: Secondary | ICD-10-CM | POA: Diagnosis not present

## 2019-10-19 DIAGNOSIS — R05 Cough: Secondary | ICD-10-CM | POA: Diagnosis present

## 2019-10-19 LAB — SARS CORONAVIRUS 2 (TAT 6-24 HRS): SARS Coronavirus 2: NEGATIVE

## 2019-10-19 MED ORDER — HYDROCOD POLST-CPM POLST ER 10-8 MG/5ML PO SUER: 5.0000 mL | Freq: Two times a day (BID) | ORAL | 0 refills | Status: DC

## 2019-10-19 NOTE — ED Provider Notes (Signed)
[facility]-EMERGENCY DEPT Provider Note   CSN: [id number] Arrival date & time: 10/19/19  8466     History Chief Complaint  Patient presents with  . Cough    Leah [name] is a 24 y.o. female.  24 year old female presents with cough congestion x4 days.  Has had mild sore throat.  No vomiting diarrhea.  No dyspnea.  No loss of smell or taste.  No fever or chills.  Today she awoke and had lost her voice.  Has been using over-the-counter medications without relief.        Past Medical History:  Diagnosis Date  . Anemia 2013  . Anxiety 2011   no meds during pregnancy  . Bipolar 1 disorder ([facility])   . Chlamydia   . Gall stones   . GERD (gastroesophageal reflux disease)   . 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 Seizure [facility]) 2011   pseudoseizures    Patient Active Problem List   Diagnosis Date Noted  . Severe recurrent major depression without psychotic features ([facility]) 12/31/2018  . S/P laparoscopic cholecystectomy 10/22/2017  . Pelvic pain 09/12/2017  . Amenorrhea, secondary 09/12/2017  . 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 UTI (urinary tract infection) 08/19/2016  . Suicide attempt by drug ingestion ([facility])   . Contraception 03/10/2014  . Biliary colic 03/10/2014  . BV (bacterial vaginosis) 03/10/2014  . Depression 03/16/2013  . Allergic rhinitis, seasonal 01/26/2013  . Anemia, iron deficiency 12/01/2012    Past Surgical History:  Procedure Laterality Date  . CHOLECYSTECTOMY N/A 09/28/2014   Procedure: LAPAROSCOPIC CHOLECYSTECTOMY;  Surgeon: Axel Filler, MD;  Location: [facility] OR;  Service: General;  Laterality: N/A;  . CHOLECYSTECTOMY    . DIRECT LARYNGOSCOPY N/A 04/24/2014   Procedure: DIRECT LARYNGOSCOPY;  Surgeon: [name], MD;  Location: [facility] OR;  Service: ENT;  Laterality: N/A;  . FOREIGN BODY REMOVAL ESOPHAGEAL N/A 04/24/2014   Procedure: REMOVAL FOREIGN BODY  ESOPHAGEAL;  Surgeon: [name], MD;  Location: [facility] OR;  Service: ENT;  Laterality: N/A;  . WISDOM TOOTH EXTRACTION  2013     OB History    Gravida  2   Para  1   Term  1   Preterm  0   AB  1   Living  1     SAB  1   TAB  0   Ectopic  0   Multiple      Live Births  1           Family History  Problem Relation Age of Onset  . Arthritis Mother   . Bronchitis Mother   . Asthma Mother   . Hearing loss Paternal Grandfather   . Diabetes Neg Hx   . Stomach cancer Neg Hx   . Colon cancer Neg Hx     Social History   Tobacco Use  . Smoking status: Light Tobacco Smoker  . Smokeless tobacco: Never Used  . Tobacco comment: marjiuna  Substance Use Topics  . Alcohol use: No    Alcohol/week: 6.0 standard drinks    Types: 6 Cans of beer per week  . Drug use: Yes    Types: Marijuana    Comment: occ.    Home Medications Prior to Admission medications   Medication Sig Start Date End Date Taking? Authorizing Provider  Ibuprofen-diphenhydrAMINE Cit (ADVIL PM PO) Take 1 tablet by mouth at bedtime as needed (sleep/pain).  [provider]  metroNIDAZOLE (METROGEL VAGINAL) 0.75 % vaginal gel Insert 1 applicatorful nightly x 10 nights then twice a week for 6 months Patient not taking: Reported on 09/24/2019 05/12/19   Marny Lowenstein, PA-C  OLANZapine (ZYPREXA) 5 MG tablet Take 5 mg by mouth at bedtime.    [provider]  ondansetron (ZOFRAN ODT) 4 MG disintegrating tablet Take 1 tablet (4 mg total) by mouth every 8 (eight) hours as needed for nausea or vomiting. 09/22/19   Gailen Shelter, PA    Allergies    Bactrim [sulfamethoxazole-trimethoprim]  Review of Systems   Review of Systems  All other systems reviewed and are negative.   Physical Exam Updated Vital Signs BP 118/71 (BP Location: Left Arm)   Pulse 92   Temp 98 F (36.7 C) (Oral)   Resp 18   SpO2 100%   Physical Exam Vitals and nursing note reviewed.  Constitutional:       General: She is not in acute distress.    Appearance: Normal appearance. She is well-developed. She is not toxic-appearing.  HENT:     Head: Normocephalic and atraumatic.  Eyes:     General: Lids are normal.     Conjunctiva/sclera: Conjunctivae normal.     Pupils: Pupils are equal, round, and reactive to light.  Neck:     Thyroid: No thyroid mass.     Trachea: No tracheal deviation.  Cardiovascular:     Rate and Rhythm: Normal rate and regular rhythm.     Heart sounds: Normal heart sounds. No murmur. No gallop.   Pulmonary:     Effort: Pulmonary effort is normal. No respiratory distress.     Breath sounds: Normal breath sounds. No stridor. No decreased breath sounds, wheezing, rhonchi or rales.  Abdominal:     General: Bowel sounds are normal. There is no distension.     Palpations: Abdomen is soft.     Tenderness: There is no abdominal tenderness. There is no rebound.  Musculoskeletal:        General: No tenderness. Normal range of motion.     Cervical back: Normal range of motion and neck supple.  Skin:    General: Skin is warm and dry.     Findings: No abrasion or rash.  Neurological:     Mental Status: She is alert and oriented to person, place, and time.     GCS: GCS eye subscore is 4. GCS verbal subscore is 5. GCS motor subscore is 6.     Cranial Nerves: No cranial nerve deficit.     Sensory: No sensory deficit.  Psychiatric:        Speech: Speech normal.        Behavior: Behavior normal.     ED Results / Procedures / Treatments   Labs (all labs ordered are listed, but only abnormal results are displayed) Labs Reviewed - No data to display  EKG None  Radiology No results found.  Procedures Procedures (including critical care time)  Medications Ordered in ED Medications - No data to display  ED Course  I have reviewed the triage vital signs and the nursing notes.  Pertinent labs & imaging results that were available during my care of the patient were  reviewed by me and considered in my medical decision making (see chart for details).    MDM Rules/Calculators/A&P                      We will  obtain Covid test here.  Patient likely viral URI.  Will prescribe cough medication return precautions given Final Clinical Impression(s) / ED Diagnoses Final diagnoses:  None    Rx / DC Orders ED Discharge Orders    None       Lacretia Leigh, MD 10/19/19 [phone]

## 2019-10-19 NOTE — ED Triage Notes (Signed)
Patient c/o cough, chest congestion, and core throat X4 days.   Patient tested negative for covid 09/22/2019.    A/Ox4 Ambulatory in triage

## 2019-10-26 DIAGNOSIS — Z79899 Other long term (current) drug therapy: Secondary | ICD-10-CM | POA: Diagnosis not present

## 2019-10-28 ENCOUNTER — Ambulatory Visit: Payer: Medicaid Other

## 2019-10-28 ENCOUNTER — Ambulatory Visit: Payer: Medicaid Other | Attending: Internal Medicine

## 2019-10-28 DIAGNOSIS — Z23 Encounter for immunization: Secondary | ICD-10-CM

## 2019-10-28 NOTE — Progress Notes (Signed)
   Covid-19 Vaccination Clinic  Name:  JUSTYN BOYSON    MRN: [id number] DOB: [birth year]  10/28/2019  Ms. Bowerman was observed post Covid-19 immunization for 15 minutes without incident. She was provided with Vaccine Information Sheet and instruction to access the V-Safe system.   Ms. Cope was instructed to call 911 with any severe reactions post vaccine: Marland Kitchen Difficulty breathing  . Swelling of face and throat  . A fast heartbeat  . A bad rash all over body  . Dizziness and weakness   Immunizations Administered    Name Date Dose VIS Date Route   Pfizer COVID-19 Vaccine 10/28/2019  9:01 AM 0.3 mL 07/02/2019 Intramuscular   Manufacturer: [employer], [employer]   Lot: [id number]   NDC: [address]-9

## 2019-11-03 DIAGNOSIS — R55 Syncope and collapse: Secondary | ICD-10-CM | POA: Diagnosis not present

## 2019-11-03 DIAGNOSIS — M542 Cervicalgia: Secondary | ICD-10-CM | POA: Diagnosis not present

## 2019-11-03 DIAGNOSIS — R404 Transient alteration of awareness: Secondary | ICD-10-CM | POA: Diagnosis not present

## 2019-11-03 DIAGNOSIS — E860 Dehydration: Secondary | ICD-10-CM | POA: Diagnosis not present

## 2019-11-03 DIAGNOSIS — R0689 Other abnormalities of breathing: Secondary | ICD-10-CM | POA: Diagnosis not present

## 2019-11-03 DIAGNOSIS — R52 Pain, unspecified: Secondary | ICD-10-CM | POA: Diagnosis not present

## 2019-11-03 DIAGNOSIS — Z79899 Other long term (current) drug therapy: Secondary | ICD-10-CM | POA: Diagnosis not present

## 2019-11-03 DIAGNOSIS — R4020 Unspecified coma: Secondary | ICD-10-CM | POA: Diagnosis not present

## 2019-11-03 DIAGNOSIS — I951 Orthostatic hypotension: Secondary | ICD-10-CM | POA: Diagnosis not present

## 2019-11-03 DIAGNOSIS — S0990XA Unspecified injury of head, initial encounter: Secondary | ICD-10-CM | POA: Diagnosis not present

## 2019-11-08 DIAGNOSIS — R079 Chest pain, unspecified: Secondary | ICD-10-CM | POA: Diagnosis not present

## 2019-11-08 DIAGNOSIS — R9431 Abnormal electrocardiogram [ECG] [EKG]: Secondary | ICD-10-CM | POA: Diagnosis not present

## 2019-11-08 DIAGNOSIS — F319 Bipolar disorder, unspecified: Secondary | ICD-10-CM | POA: Diagnosis not present

## 2019-11-08 DIAGNOSIS — R0689 Other abnormalities of breathing: Secondary | ICD-10-CM | POA: Diagnosis not present

## 2019-11-08 DIAGNOSIS — Z791 Long term (current) use of non-steroidal anti-inflammatories (NSAID): Secondary | ICD-10-CM | POA: Diagnosis not present

## 2019-11-08 DIAGNOSIS — R42 Dizziness and giddiness: Secondary | ICD-10-CM | POA: Diagnosis not present

## 2019-11-08 DIAGNOSIS — R52 Pain, unspecified: Secondary | ICD-10-CM | POA: Diagnosis not present

## 2019-11-08 DIAGNOSIS — R0602 Shortness of breath: Secondary | ICD-10-CM | POA: Diagnosis not present

## 2019-11-08 DIAGNOSIS — R7989 Other specified abnormal findings of blood chemistry: Secondary | ICD-10-CM | POA: Diagnosis not present

## 2019-11-08 DIAGNOSIS — Z79899 Other long term (current) drug therapy: Secondary | ICD-10-CM | POA: Diagnosis not present

## 2019-11-08 DIAGNOSIS — M542 Cervicalgia: Secondary | ICD-10-CM | POA: Diagnosis not present

## 2019-11-08 DIAGNOSIS — R069 Unspecified abnormalities of breathing: Secondary | ICD-10-CM | POA: Diagnosis not present

## 2019-11-08 DIAGNOSIS — R002 Palpitations: Secondary | ICD-10-CM | POA: Diagnosis not present

## 2019-11-09 DIAGNOSIS — R7989 Other specified abnormal findings of blood chemistry: Secondary | ICD-10-CM | POA: Diagnosis not present

## 2019-11-09 DIAGNOSIS — R079 Chest pain, unspecified: Secondary | ICD-10-CM | POA: Diagnosis not present

## 2019-11-11 ENCOUNTER — Other Ambulatory Visit: Payer: Self-pay

## 2019-11-11 ENCOUNTER — Other Ambulatory Visit (HOSPITAL_COMMUNITY)
Admission: RE | Admit: 2019-11-11 | Discharge: 2019-11-11 | Disposition: A | Discharge status: Home / Self Care | Payer: Medicaid Other | Source: Ambulatory Visit | Attending: Family Medicine | Admitting: Family Medicine

## 2019-11-11 ENCOUNTER — Ambulatory Visit (INDEPENDENT_AMBULATORY_CARE_PROVIDER_SITE_OTHER): Payer: Medicaid Other | Admitting: *Deleted

## 2019-11-11 DIAGNOSIS — Z113 Encounter for screening for infections with a predominantly sexual mode of transmission: Secondary | ICD-10-CM | POA: Diagnosis not present

## 2019-11-11 DIAGNOSIS — N760 Acute vaginitis: Secondary | ICD-10-CM | POA: Diagnosis not present

## 2019-11-11 DIAGNOSIS — B9689 Other specified bacterial agents as the cause of diseases classified elsewhere: Secondary | ICD-10-CM | POA: Insufficient documentation

## 2019-11-11 NOTE — Progress Notes (Signed)
Agree with A & P. 

## 2019-11-11 NOTE — Progress Notes (Signed)
Here today for std testing. States no symptoms or exposure - just has new partner and wants to get tested.  Will do self swab. Would like to check for GC/ Trich and also BV because gets bv often and is on 6 month treatment with metrogel.  Kerline Trahan,RN

## 2019-11-12 DIAGNOSIS — F331 Major depressive disorder, recurrent, moderate: Secondary | ICD-10-CM | POA: Diagnosis not present

## 2019-11-12 LAB — CERVICOVAGINAL ANCILLARY ONLY
Bacterial Vaginitis (gardnerella): NEGATIVE
Candida Glabrata: NEGATIVE
Candida Vaginitis: NEGATIVE
Chlamydia: NEGATIVE
Comment: NEGATIVE
Comment: NEGATIVE
Comment: NEGATIVE
Comment: NEGATIVE
Comment: NEGATIVE
Comment: NORMAL
Neisseria Gonorrhea: NEGATIVE
Trichomonas: NEGATIVE

## 2019-11-22 ENCOUNTER — Encounter (HOSPITAL_COMMUNITY): Payer: Self-pay

## 2019-11-22 ENCOUNTER — Other Ambulatory Visit: Payer: Self-pay

## 2019-11-22 ENCOUNTER — Emergency Department (HOSPITAL_COMMUNITY)
Admission: EM | Admit: 2019-11-22 | Discharge: 2019-11-22 | Disposition: A | Discharge status: Home / Self Care | Payer: Medicaid Other | Attending: Emergency Medicine | Admitting: Emergency Medicine

## 2019-11-22 ENCOUNTER — Ambulatory Visit: Payer: Medicaid Other | Attending: Internal Medicine

## 2019-11-22 ENCOUNTER — Emergency Department (HOSPITAL_COMMUNITY): Payer: Medicaid Other

## 2019-11-22 DIAGNOSIS — M7918 Myalgia, other site: Secondary | ICD-10-CM | POA: Diagnosis not present

## 2019-11-22 DIAGNOSIS — R110 Nausea: Secondary | ICD-10-CM | POA: Diagnosis not present

## 2019-11-22 DIAGNOSIS — R52 Pain, unspecified: Secondary | ICD-10-CM

## 2019-11-22 DIAGNOSIS — R079 Chest pain, unspecified: Secondary | ICD-10-CM | POA: Diagnosis not present

## 2019-11-22 DIAGNOSIS — Z23 Encounter for immunization: Secondary | ICD-10-CM

## 2019-11-22 DIAGNOSIS — R0789 Other chest pain: Secondary | ICD-10-CM | POA: Diagnosis not present

## 2019-11-22 LAB — TROPONIN I (HIGH SENSITIVITY): Troponin I (High Sensitivity): <2 ng/L (ref <18)

## 2019-11-22 MED ORDER — SODIUM CHLORIDE 0.9% FLUSH
3.0000 mL | Freq: Once | INTRAVENOUS | Status: DC
  Administered 2019-11-22: 3 mL via INTRAVENOUS

## 2019-11-22 MED ORDER — KETOROLAC TROMETHAMINE 15 MG/ML IJ SOLN
15.0000 mg | Freq: Once | INTRAMUSCULAR | Status: AC
  Administered 2019-11-22: 15 mg via INTRAVENOUS
  Filled 2019-11-22: qty 1

## 2019-11-22 MED ORDER — LACTATED RINGERS IV BOLUS
1000.0000 mL | Freq: Once | INTRAVENOUS | Status: AC
  Administered 2019-11-22: 1000 mL via INTRAVENOUS

## 2019-11-22 MED ORDER — ONDANSETRON HCL 4 MG/2ML IJ SOLN
4.0000 mg | Freq: Once | INTRAMUSCULAR | Status: AC
  Administered 2019-11-22: 4 mg via INTRAVENOUS
  Filled 2019-11-22: qty 2

## 2019-11-22 NOTE — Progress Notes (Signed)
   Covid-19 Vaccination Clinic  Name:  Leah [name]    MRN: [id number] DOB: 20-Dec-1995  11/22/2019  [name] was observed post Covid-19 immunization for 15 minutes without incident. She was provided with Vaccine Information Sheet and instruction to access the V-Safe system.   [name] was instructed to call 911 with any severe reactions post vaccine: Marland Kitchen Difficulty breathing  . Swelling of face and throat  . A fast heartbeat  . A bad rash all over body  . Dizziness and weakness   Immunizations Administered    Name Date Dose VIS Date Route   Pfizer COVID-19 Vaccine 11/22/2019  8:30 AM 0.3 mL 09/15/2018 Intramuscular   Manufacturer: [employer], [employer]   Lot: [id number]   NDC: [address]-0

## 2019-11-22 NOTE — ED Triage Notes (Signed)
Pt c/o chest pain and nausea x3 hours. Pt states chest pain feels tight. Pt states she received her second COVID vaccine this morning and she believes its related to her symptoms.

## 2019-11-22 NOTE — ED Provider Notes (Signed)
[facility]-EMERGENCY DEPT Provider Note   CSN: [id number] Arrival date & time: 11/22/19  2113     History Chief Complaint  Patient presents with  . Chest Pain    Leah [name] is a 24 y.o. female.  HPI   24yf with body aches and nausea. Received 2nd COVID this morning. Began feeling poorly this evening. Feels achy all over. Nausea. No vomiting. Chest feels tight. No dyspnea. No cough. No unusual leg pain or swelling. No diarrhea. No sick contacts.   Past Medical History:  Diagnosis Date  . Anemia 2013  . Anxiety 2011   no meds during pregnancy  . Bipolar 1 disorder (HCC)   . Chlamydia   . Gall stones   . GERD (gastroesophageal reflux disease)   . 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 Seizure [facility]) 2011   pseudoseizures    Patient Active Problem List   Diagnosis Date Noted  . Severe recurrent major depression without psychotic features (HCC) 12/31/2018  . S/P laparoscopic cholecystectomy 10/22/2017  . Pelvic pain 09/12/2017  . Amenorrhea, secondary 09/12/2017  . 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 UTI (urinary tract infection) 08/19/2016  . Suicide attempt by drug ingestion (HCC)   . Contraception 03/10/2014  . Biliary colic 03/10/2014  . BV (bacterial vaginosis) 03/10/2014  . Depression 03/16/2013  . Allergic rhinitis, seasonal 01/26/2013  . Anemia, iron deficiency 12/01/2012    Past Surgical History:  Procedure Laterality Date  . CHOLECYSTECTOMY N/A 09/28/2014   Procedure: LAPAROSCOPIC CHOLECYSTECTOMY;  Surgeon: Axel Filler, MD;  Location: [facility] OR;  Service: General;  Laterality: N/A;  . CHOLECYSTECTOMY    . DIRECT LARYNGOSCOPY N/A 04/24/2014   Procedure: DIRECT LARYNGOSCOPY;  Surgeon: [name], MD;  Location: [facility] OR;  Service: ENT;  Laterality: N/A;  . FOREIGN BODY REMOVAL ESOPHAGEAL N/A 04/24/2014   Procedure: REMOVAL FOREIGN BODY ESOPHAGEAL;  Surgeon: [name], MD;  Location: [facility] OR;  Service: ENT;  Laterality: N/A;  . WISDOM TOOTH EXTRACTION  2013     OB History    Gravida  2   Para  1   Term  1   Preterm  0   AB  1   Living  1     SAB  1   TAB  0   Ectopic  0   Multiple      Live Births  1           Family History  Problem Relation Age of Onset  . Arthritis Mother   . Bronchitis Mother   . Asthma Mother   . Hearing loss Paternal Grandfather   . Diabetes Neg Hx   . Stomach cancer Neg Hx   . Colon cancer Neg Hx     Social History   Tobacco Use  . Smoking status: Light Tobacco Smoker  . Smokeless tobacco: Never Used  . Tobacco comment: marjiuna  Substance Use Topics  . Alcohol use: No    Alcohol/week: 6.0 standard drinks    Types: 6 Cans of beer per week  . Drug use: Yes    Types: Marijuana    Comment: occ.    Home Medications Prior to Admission medications   Medication Sig Start Date End Date Taking? Authorizing Provider  OLANZapine (ZYPREXA) 5 MG tablet Take 5 mg by mouth at bedtime.   Yes [provider]  chlorpheniramine-HYDROcodone Stevphen Meuse PENNKINETIC ER) 10-8 MG/5ML  SUER Take 5 mLs by mouth 2 (two) times daily. Patient not taking: Reported on 11/22/2019 10/19/19   Lorre Nick, MD  metroNIDAZOLE (METROGEL VAGINAL) 0.75 % vaginal gel Insert 1 applicatorful nightly x 10 nights then twice a week for 6 months Patient not taking: Reported on 11/22/2019 05/12/19   Marny Lowenstein, PA-C  ondansetron (ZOFRAN ODT) 4 MG disintegrating tablet Take 1 tablet (4 mg total) by mouth every 8 (eight) hours as needed for nausea or vomiting. Patient not taking: Reported on 11/22/2019 09/22/19   Gailen Shelter, PA    Allergies    Bactrim [sulfamethoxazole-trimethoprim]  Review of Systems   Review of Systems All systems reviewed and negative, other than as noted in HPI.  Physical Exam Updated Vital Signs BP 131/76   Pulse 98   Temp 98.3 F (36.8 C) (Oral)   Resp 18   SpO2 99%   Physical  Exam Vitals and nursing note reviewed.  Constitutional:      General: She is not in acute distress.    Appearance: She is well-developed.  HENT:     Head: Normocephalic and atraumatic.  Eyes:     General:        Right eye: No discharge.        Left eye: No discharge.     Conjunctiva/sclera: Conjunctivae normal.  Cardiovascular:     Rate and Rhythm: Normal rate and regular rhythm.     Heart sounds: Normal heart sounds. No murmur. No friction rub. No gallop.   Pulmonary:     Effort: Pulmonary effort is normal. No respiratory distress.     Breath sounds: Normal breath sounds.  Abdominal:     General: There is no distension.     Palpations: Abdomen is soft.     Tenderness: There is no abdominal tenderness.  Musculoskeletal:        General: No tenderness.     Cervical back: Neck supple.  Skin:    General: Skin is warm and dry.  Neurological:     Mental Status: She is alert.  Psychiatric:        Behavior: Behavior normal.        Thought Content: Thought content normal.     ED Results / Procedures / Treatments   Labs (all labs ordered are listed, but only abnormal results are displayed) Labs Reviewed  TROPONIN I (HIGH SENSITIVITY)    EKG None   EKG:  Rhythm: sinus tachycardia Rate: 100 PR: 165 ms QRS: 79 ms QTc: 427 ms ST segments: normal   Radiology DG Chest 2 View  Result Date: 11/22/2019 CLINICAL DATA:  Chest pain, nausea, and vomiting. EXAM: CHEST - 2 VIEW COMPARISON:  07/28/2019 FINDINGS: The cardiomediastinal silhouette is within normal limits. The lungs are well inflated and clear. There is no evidence of pleural effusion or pneumothorax. No acute osseous abnormality is identified. IMPRESSION: No active cardiopulmonary disease. Electronically Signed   By: Sebastian Ache M.D.   On: 11/22/2019 21:52    Procedures Procedures (including critical care time)  Medications Ordered in ED Medications  lactated ringers bolus 1,000 mL (has no administration in time  range)  ketorolac (TORADOL) 15 MG/ML injection 15 mg (has no administration in time range)  ondansetron (ZOFRAN) injection 4 mg (has no administration in time range)    ED Course  I have reviewed the triage vital signs and the nursing notes.  Pertinent labs & imaging results that were available during my care of the patient were reviewed  by me and considered in my medical decision making (see chart for details).    MDM Rules/Calculators/A&P                      24yF with body aches and nausea. Suspect related to recent COVID vaccination. I doubt emergent process. Afebrile. Lungs clear. No increased WOB. Abdomen benign. Treated symptomatically with improvement. Return precautions discussed.   Final Clinical Impression(s) / ED Diagnoses Final diagnoses:  Body aches    Rx / DC Orders ED Discharge Orders    None       Virgel Manifold, MD 11/24/19 [phone]

## 2019-11-22 NOTE — Discharge Instructions (Signed)
Symptoms like you are having are somewhat common after the COVID vaccine. They typically resolve in about a day or two though. Drink plenty of fluids and rest. You can take 600 mg of ibuprofen every 6 hours as needs for aches.

## 2019-12-01 ENCOUNTER — Emergency Department (HOSPITAL_COMMUNITY)
Admission: EM | Admit: 2019-12-01 | Discharge: 2019-12-01 | Disposition: A | Discharge status: Home / Self Care | Payer: Medicaid Other | Attending: Emergency Medicine | Admitting: Emergency Medicine

## 2019-12-01 ENCOUNTER — Encounter (HOSPITAL_COMMUNITY): Payer: Self-pay | Admitting: Emergency Medicine

## 2019-12-01 ENCOUNTER — Other Ambulatory Visit: Payer: Self-pay

## 2019-12-01 DIAGNOSIS — Z79899 Other long term (current) drug therapy: Secondary | ICD-10-CM | POA: Diagnosis not present

## 2019-12-01 DIAGNOSIS — F17210 Nicotine dependence, cigarettes, uncomplicated: Secondary | ICD-10-CM | POA: Diagnosis not present

## 2019-12-01 DIAGNOSIS — Z20822 Contact with and (suspected) exposure to covid-19: Secondary | ICD-10-CM | POA: Insufficient documentation

## 2019-12-01 DIAGNOSIS — J069 Acute upper respiratory infection, unspecified: Secondary | ICD-10-CM | POA: Diagnosis not present

## 2019-12-01 DIAGNOSIS — J029 Acute pharyngitis, unspecified: Secondary | ICD-10-CM | POA: Diagnosis present

## 2019-12-01 LAB — SARS CORONAVIRUS 2 (TAT 6-24 HRS): SARS Coronavirus 2: NEGATIVE

## 2019-12-01 MED ORDER — BENZONATATE 100 MG PO CAPS: 100.0000 mg | ORAL_CAPSULE | Freq: Three times a day (TID) | ORAL | 0 refills | Status: AC

## 2019-12-01 MED ORDER — CHLORASEPTIC GARGLE 1.4 % MT LIQD: 1.0000 | OROMUCOSAL | 0 refills | Status: DC | PRN

## 2019-12-01 MED ORDER — FLUTICASONE PROPIONATE 50 MCG/ACT NA SUSP: 2.0000 | Freq: Every day | NASAL | 0 refills | Status: DC

## 2019-12-01 NOTE — ED Provider Notes (Signed)
[facility]-EMERGENCY DEPT Provider Note   CSN: [id number] Arrival date & time: 12/01/19  8921     History Chief Complaint  Patient presents with  . Sore Throat    Leah [name] is a 24 y.o. female.  HPI   Patient is a 24 year old female with a history of anemia, anxiety, bipolar disorder, chlamydia, gallstones, GERD, pseudoseizure, seizures, who presents the emergency department today for evaluation of upper respiratory symptoms.  She states that for the last 2 days she has had a cough, headache, rhinorrhea, nasal congestion and a sore/scratchy throat.  She is also lost her voice after coughing for the last several days.  She denies any shortness of breath, vomiting, diarrhea.  She denies any fevers.  She denies any known Covid exposures.  She has tried over-the-counter Mucinex without significant relief of symptoms.  Past Medical History:  Diagnosis Date  . Anemia 2013  . Anxiety 2011   no meds during pregnancy  . Bipolar 1 disorder (HCC)   . Chlamydia   . Gall stones   . GERD (gastroesophageal reflux disease)   . 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 Seizure [facility]) 2011   pseudoseizures    Patient Active Problem List   Diagnosis Date Noted  . Severe recurrent major depression without psychotic features (HCC) 12/31/2018  . S/P laparoscopic cholecystectomy 10/22/2017  . Pelvic pain 09/12/2017  . Amenorrhea, secondary 09/12/2017  . 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 UTI (urinary tract infection) 08/19/2016  . Suicide attempt by drug ingestion (HCC)   . Contraception 03/10/2014  . Biliary colic 03/10/2014  . BV (bacterial vaginosis) 03/10/2014  . Depression 03/16/2013  . Allergic rhinitis, seasonal 01/26/2013  . Anemia, iron deficiency 12/01/2012    Past Surgical History:  Procedure Laterality Date  . CHOLECYSTECTOMY N/A 09/28/2014   Procedure: LAPAROSCOPIC  CHOLECYSTECTOMY;  Surgeon: Axel Filler, MD;  Location: [facility] OR;  Service: General;  Laterality: N/A;  . CHOLECYSTECTOMY    . DIRECT LARYNGOSCOPY N/A 04/24/2014   Procedure: DIRECT LARYNGOSCOPY;  Surgeon: [name], MD;  Location: [facility] OR;  Service: ENT;  Laterality: N/A;  . FOREIGN BODY REMOVAL ESOPHAGEAL N/A 04/24/2014   Procedure: REMOVAL FOREIGN BODY ESOPHAGEAL;  Surgeon: [name], MD;  Location: [facility] OR;  Service: ENT;  Laterality: N/A;  . WISDOM TOOTH EXTRACTION  2013     OB History    Gravida  2   Para  1   Term  1   Preterm  0   AB  1   Living  1     SAB  1   TAB  0   Ectopic  0   Multiple      Live Births  1           Family History  Problem Relation Age of Onset  . Arthritis Mother   . Bronchitis Mother   . Asthma Mother   . Hearing loss Paternal Grandfather   . Diabetes Neg Hx   . Stomach cancer Neg Hx   . Colon cancer Neg Hx     Social History   Tobacco Use  . Smoking status: Light Tobacco Smoker  . Smokeless tobacco: Never Used  . Tobacco comment: marjiuna  Substance Use Topics  . Alcohol use: No    Alcohol/week: 6.0 standard drinks    Types: 6 Cans of beer per week  . Drug use: Yes  Types: Marijuana    Comment: occ.    Home Medications Prior to Admission medications   Medication Sig Start Date End Date Taking? Authorizing Provider  guaiFENesin (ROBITUSSIN) 100 MG/5ML liquid Take 200 mg by mouth 3 (three) times daily as needed for cough.   Yes [provider]  OLANZapine (ZYPREXA) 5 MG tablet Take 5 mg by mouth at bedtime.   Yes [provider]  benzonatate (TESSALON) 100 MG capsule Take 1 capsule (100 mg total) by mouth every 8 (eight) hours for 5 days. 12/01/19 12/06/19  Arletta Lumadue S, PA-C  chlorpheniramine-HYDROcodone (TUSSIONEX PENNKINETIC ER) 10-8 MG/5ML SUER Take 5 mLs by mouth 2 (two) times daily. Patient not taking: Reported on 11/22/2019 10/19/19   Lacretia Leigh, MD  fluticasone [facility]) 50 MCG/ACT nasal  spray Place 2 sprays into both nostrils daily. 12/01/19   Casimir Barcellos S, PA-C  metroNIDAZOLE (METROGEL VAGINAL) 0.75 % vaginal gel Insert 1 applicatorful nightly x 10 nights then twice a week for 6 months Patient not taking: Reported on 11/22/2019 05/12/19   Luvenia Redden, PA-C  ondansetron (ZOFRAN ODT) 4 MG disintegrating tablet Take 1 tablet (4 mg total) by mouth every 8 (eight) hours as needed for nausea or vomiting. Patient not taking: Reported on 11/22/2019 09/22/19   Tedd Sias, PA  phenol (CHLORASEPTIC GARGLE) 1.4 % LIQD Use as directed 1 spray in the mouth or throat as needed for throat irritation / pain. 12/01/19   Caisley Baxendale S, PA-C    Allergies    Bactrim [sulfamethoxazole-trimethoprim]  Review of Systems   Review of Systems  Constitutional: Negative for fever.  HENT: Positive for congestion, rhinorrhea, sore throat and voice change. Negative for ear pain.   Eyes: Negative for visual disturbance.  Respiratory: Positive for cough. Negative for shortness of breath.   Cardiovascular: Negative for chest pain.  Gastrointestinal: Negative for abdominal pain, diarrhea, nausea and vomiting.  Genitourinary: Negative for dysuria and hematuria.  Musculoskeletal: Negative for back pain.  Skin: Negative for rash.  Neurological: Positive for headaches.  All other systems reviewed and are negative.   Physical Exam Updated Vital Signs BP (!) 148/114 (BP Location: Right Arm)   Pulse 94   Temp 98.1 F (36.7 C) (Oral)   Resp 16   Ht 5' (1.524 m)   Wt 63 kg   SpO2 99%   BMI 27.13 kg/m   Physical Exam Vitals and nursing note reviewed.  Constitutional:      General: She is not in acute distress.    Appearance: She is well-developed.  HENT:     Head: Normocephalic and atraumatic.     Mouth/Throat:     Mouth: Mucous membranes are moist.     Pharynx: No oropharyngeal exudate, posterior oropharyngeal erythema or uvula swelling.     Tonsils: No tonsillar exudate or tonsillar  abscesses. 0 on the right. 0 on the left.  Eyes:     Conjunctiva/sclera: Conjunctivae normal.  Cardiovascular:     Rate and Rhythm: Normal rate and regular rhythm.     Heart sounds: Normal heart sounds. No murmur.  Pulmonary:     Effort: Pulmonary effort is normal. No respiratory distress.     Breath sounds: Normal breath sounds. No wheezing, rhonchi or rales.  Abdominal:     Palpations: Abdomen is soft.     Tenderness: There is no abdominal tenderness.  Musculoskeletal:     Cervical back: Neck supple.  Skin:    General: Skin is warm and dry.  Neurological:     Mental Status: She is alert.     ED Results / Procedures / Treatments   Labs (all labs ordered are listed, but only abnormal results are displayed) Labs Reviewed  SARS CORONAVIRUS 2 (TAT 6-24 HRS)    EKG None  Radiology No results found.  Procedures Procedures (including critical care time)  Medications Ordered in ED Medications - No data to display  ED Course  I have reviewed the triage vital signs and the nursing notes.  Pertinent labs & imaging results that were available during my care of the patient were reviewed by me and considered in my medical decision making (see chart for details).    MDM Rules/Calculators/A&P                      Patient presenting for evaluation for URI sxs.  Reports symptoms ongoing for 2 days.  Patient nontoxic, well-appearing, no distress.  Vital signs are reassuring. Exam is reassuring and pt has normal lung sounds. No erythema, exudate, or tonsillar edema to suggest strep throat. CENTOR criteria 0 - no further testing or antibiotics. Tested for Covid in the ED. Results pending at the time of discharge. Advised on quarantine measures if positive. Will give Rx for symptomatic management. Advised on f/u and return precautions. Pt voiced understanding of the plan and reasons to return. All questions answered, pt stable for d/c.  ----  Leah [name] was evaluated in Emergency  Department on 12/01/2019 for the symptoms described in the history of present illness. She was evaluated in the context of the global COVID-19 pandemic, which necessitated consideration that the patient might be at risk for infection with the SARS-CoV-2 virus that causes COVID-19. Institutional protocols and algorithms that pertain to the evaluation of patients at risk for COVID-19 are in a state of rapid change based on information released by regulatory bodies including the CDC and federal and state organizations. These policies and algorithms were followed during the patient's care in the ED.   Final Clinical Impression(s) / ED Diagnoses Final diagnoses:  Upper respiratory tract infection, unspecified type  Person under investigation for COVID-19    Rx / DC Orders ED Discharge Orders         Ordered    benzonatate (TESSALON) 100 MG capsule  Every 8 hours     12/01/19 0748    fluticasone (FLONASE) 50 MCG/ACT nasal spray  Daily     12/01/19 0748    phenol (CHLORASEPTIC GARGLE) 1.4 % LIQD  As needed     12/01/19 0748           Karrie Meres, PA-C 12/01/19 0749    Lorre Nick, MD 12/03/19 1111

## 2019-12-01 NOTE — ED Triage Notes (Signed)
C/C sore throat x2 days, lost her voice today. Itchy/scratchy red throat with a dry cough. Complains of fever and HA as well. Well-appearing.

## 2019-12-01 NOTE — Discharge Instructions (Addendum)
You were given a prescription for cough medication, nasal spray for your congestion, and throat spray for your sore throat.  If you were given a prescription, please take the prescription as you were instructed and follow the directions given on the discharge paperwork.   Over the next several days you should rest as much as possible, and drink more fluids than usual. Liquids will help thin and loosen mucus so you can cough it up. Liquids will also help prevent dehydration. Using a cool mist humidifier or a vaporizer to increase air moisture in your home can also make it easier for you to breathe and help decrease your cough.  To help soothe a sore throat gargle with warm salt water.  Make salt water by dissolving  teaspoon salt in 1 cup warm water. You may also use throat lozenges.  You were tested for COVID today and the results should be available for the next 24 hours. If the results of positive, you should be isolated for at least 7 days since the onset of your symptoms AND >72 hours after symptoms resolution (absence of fever without the use of fever reducing medication and improvement in respiratory symptoms), whichever is longer  Please follow up with your primary care provider within 5-7 days for re-evaluation of your symptoms. If you do not have a primary care provider, information for a healthcare clinic has been provided for you to make arrangements for follow up care. Please return to the emergency department for any new or worsening symptoms including shortness of breath or chest pain.

## 2019-12-17 ENCOUNTER — Emergency Department (HOSPITAL_COMMUNITY)
Admission: EM | Admit: 2019-12-17 | Discharge: 2019-12-17 | Disposition: A | Discharge status: Home / Self Care | Payer: Medicaid Other | Attending: Emergency Medicine | Admitting: Emergency Medicine

## 2019-12-17 ENCOUNTER — Ambulatory Visit: Payer: Self-pay

## 2019-12-17 ENCOUNTER — Other Ambulatory Visit: Payer: Self-pay

## 2019-12-17 ENCOUNTER — Encounter (HOSPITAL_COMMUNITY): Payer: Self-pay | Admitting: Emergency Medicine

## 2019-12-17 DIAGNOSIS — Z79899 Other long term (current) drug therapy: Secondary | ICD-10-CM | POA: Insufficient documentation

## 2019-12-17 DIAGNOSIS — Z975 Presence of (intrauterine) contraceptive device: Secondary | ICD-10-CM

## 2019-12-17 DIAGNOSIS — R21 Rash and other nonspecific skin eruption: Secondary | ICD-10-CM | POA: Diagnosis not present

## 2019-12-17 DIAGNOSIS — L299 Pruritus, unspecified: Secondary | ICD-10-CM | POA: Diagnosis present

## 2019-12-17 DIAGNOSIS — F17200 Nicotine dependence, unspecified, uncomplicated: Secondary | ICD-10-CM | POA: Insufficient documentation

## 2019-12-17 MED ORDER — HYDROCORTISONE 2.5 % EX LOTN: TOPICAL_LOTION | Freq: Two times a day (BID) | CUTANEOUS | 0 refills | Status: DC

## 2019-12-17 NOTE — Telephone Encounter (Addendum)
Patient called stating that she has bith control implant in her arm.  She states that it is painful and she wants it removed. I suggested that she call her PCP or OBGYN  She stated that they were closed. I directed her to go to ER to have the area evaluated.  She verbalized understanding and will go to ER.

## 2019-12-17 NOTE — ED Notes (Signed)
Patient verbalizes understanding of discharge instructions. Opportunity for questioning and answers were provided. Armband removed by staff, pt discharged from ED and ambulated to lobby to return home.   

## 2019-12-17 NOTE — Discharge Instructions (Addendum)
Please use the hydrocortisone cream as prescribed.  Call your OB/GYN to schedule an appointment to have your Nexplanon removed.  Return to the emergency department for new or worsening symptoms.

## 2019-12-17 NOTE — ED Triage Notes (Signed)
Pt requesting to have her nexplanon removed. States she had an appointment with her doctor to have it removed today, but office was closed when she arrived.

## 2019-12-17 NOTE — ED Provider Notes (Signed)
[facility] EMERGENCY DEPARTMENT Provider Note   CSN: [id number] Arrival date & time: 12/17/19  1614     History Chief Complaint  Patient presents with  . Birthcontrol Removal    [name] MAGGI HERSHKOWITZ is a 24 y.o. female.  HPI   24 year old female with a history of anemia, anxiety, bipolar 1 disorder, chlamydia, gallstones, GERD, pseudoseizures, who presents emergency department today requesting to have her Nexplanon removed.  States she had it placed several weeks ago and has had intermittent bruising to the area.  She also feels like it is itchy and painful to the area.  She has had no fevers.  She called her OB/GYN to have it removed however she states the appointment was scheduled for the wrong day and now she is not getting it out until the end of next month.  She decided to come to the ED for evaluation.  Past Medical History:  Diagnosis Date  . Anemia 2013  . Anxiety 2011   no meds during pregnancy  . Bipolar 1 disorder (HCC)   . Chlamydia   . Gall stones   . GERD (gastroesophageal reflux disease)   . 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 Seizure [facility]) 2011   pseudoseizures    Patient Active Problem List   Diagnosis Date Noted  . Severe recurrent major depression without psychotic features (HCC) 12/31/2018  . S/P laparoscopic cholecystectomy 10/22/2017  . Pelvic pain 09/12/2017  . Amenorrhea, secondary 09/12/2017  . 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 UTI (urinary tract infection) 08/19/2016  . Suicide attempt by drug ingestion (HCC)   . Contraception 03/10/2014  . Biliary colic 03/10/2014  . BV (bacterial vaginosis) 03/10/2014  . Depression 03/16/2013  . Allergic rhinitis, seasonal 01/26/2013  . Anemia, iron deficiency 12/01/2012    Past Surgical History:  Procedure Laterality Date  . CHOLECYSTECTOMY N/A 09/28/2014   Procedure: LAPAROSCOPIC CHOLECYSTECTOMY;  Surgeon:  Axel Filler, MD;  Location: [facility] OR;  Service: General;  Laterality: N/A;  . CHOLECYSTECTOMY    . DIRECT LARYNGOSCOPY N/A 04/24/2014   Procedure: DIRECT LARYNGOSCOPY;  Surgeon: [name], MD;  Location: [facility] OR;  Service: ENT;  Laterality: N/A;  . FOREIGN BODY REMOVAL ESOPHAGEAL N/A 04/24/2014   Procedure: REMOVAL FOREIGN BODY ESOPHAGEAL;  Surgeon: [name], MD;  Location: [facility] OR;  Service: ENT;  Laterality: N/A;  . WISDOM TOOTH EXTRACTION  2013     OB History    Gravida  2   Para  1   Term  1   Preterm  0   AB  1   Living  1     SAB  1   TAB  0   Ectopic  0   Multiple      Live Births  1           Family History  Problem Relation Age of Onset  . Arthritis Mother   . Bronchitis Mother   . Asthma Mother   . Hearing loss Paternal Grandfather   . Diabetes Neg Hx   . Stomach cancer Neg Hx   . Colon cancer Neg Hx     Social History   Tobacco Use  . Smoking status: Light Tobacco Smoker  . Smokeless tobacco: Never Used  . Tobacco comment: marjiuna  Substance Use Topics  . Alcohol use: No    Alcohol/week: 6.0 standard drinks    Types: 6 Cans  of beer per week  . Drug use: Yes    Types: Marijuana    Comment: occ.    Home Medications Prior to Admission medications   Medication Sig Start Date End Date Taking? Authorizing Provider  OLANZapine (ZYPREXA) 10 MG tablet Take 10 mg by mouth at bedtime.   Yes [provider]  chlorpheniramine-HYDROcodone (TUSSIONEX PENNKINETIC ER) 10-8 MG/5ML SUER Take 5 mLs by mouth 2 (two) times daily. Patient not taking: Reported on 11/22/2019 10/19/19   Lorre Nick, MD  fluticasone [facility]) 50 MCG/ACT nasal spray Place 2 sprays into both nostrils daily. Patient not taking: Reported on 12/17/2019 12/01/19   Halton Neas S, PA-C  hydrocortisone 2.5 % lotion Apply topically 2 (two) times daily. 12/17/19   Cedra Villalon S, PA-C  metroNIDAZOLE (METROGEL VAGINAL) 0.75 % vaginal gel Insert 1 applicatorful nightly x 10  nights then twice a week for 6 months Patient not taking: Reported on 11/22/2019 05/12/19   Marny Lowenstein, PA-C  ondansetron (ZOFRAN ODT) 4 MG disintegrating tablet Take 1 tablet (4 mg total) by mouth every 8 (eight) hours as needed for nausea or vomiting. Patient not taking: Reported on 11/22/2019 09/22/19   Gailen Shelter, PA  phenol (CHLORASEPTIC GARGLE) 1.4 % LIQD Use as directed 1 spray in the mouth or throat as needed for throat irritation / pain. Patient not taking: Reported on 12/17/2019 12/01/19   Sherrilynn Gudgel S, PA-C    Allergies    Bactrim [sulfamethoxazole-trimethoprim]  Review of Systems   Review of Systems  Constitutional: Negative for fever.  Musculoskeletal:       Left arm pain  Skin: Positive for rash.  Neurological: Negative for weakness and numbness.    Physical Exam Updated Vital Signs BP 136/84 (BP Location: Right Arm)   Pulse 78   Temp 98.5 F (36.9 C) (Oral)   Resp 16   SpO2 100%   Physical Exam Vitals and nursing note reviewed.  Constitutional:      General: She is not in acute distress.    Appearance: She is well-developed.  HENT:     Head: Normocephalic.  Eyes:     Conjunctiva/sclera: Conjunctivae normal.  Cardiovascular:     Rate and Rhythm: Normal rate.  Pulmonary:     Effort: Pulmonary effort is normal.  Musculoskeletal:        General: Normal range of motion.     Cervical back: Neck supple.     Comments: Nexplanon is intact and there is no evidence of induration, fluctuance present.  There are some mild tenderness to the area.  There is some excoriation to the skin with an area of erythema that appears consistent with contact dermatitis.  Skin:    General: Skin is warm and dry.  Neurological:     Mental Status: She is alert.     ED Results / Procedures / Treatments   Labs (all labs ordered are listed, but only abnormal results are displayed) Labs Reviewed - No data to display  EKG None  Radiology No results found.   Procedures Procedures (including critical care time)  Medications Ordered in ED Medications - No data to display  ED Course  I have reviewed the triage vital signs and the nursing notes.  Pertinent labs & imaging results that were available during my care of the patient were reviewed by me and considered in my medical decision making (see chart for details).    MDM Rules/Calculators/A&P  24 year old female presenting for evaluation requesting Nexplanon removal.  Was placed several weeks ago but has been bothering her.  She has some pain and itching to the area.  She does have some evidence of contact dermatitis on exam but she does not have any evidence of overt infection from the [place].  I will give her some hydrocortisone cream for the itching.  Have advised her that she needs to follow-up with her OB/GYN for removal of this as we did not complete this type of procedure in the emergency department.  She voices understanding of the plan and reasons to return.  All questions answered.  Patient stable for discharge.  Final Clinical Impression(s) / ED Diagnoses Final diagnoses:  Rash  Nexplanon in place    Rx / DC Orders ED Discharge Orders         Ordered    hydrocortisone 2.5 % lotion  2 times daily     12/17/19 [address] [place], PA-C 12/17/19 1850    Maudie Flakes, MD 12/23/19 1600

## 2019-12-21 ENCOUNTER — Encounter: Payer: Self-pay | Admitting: Obstetrics and Gynecology

## 2019-12-21 ENCOUNTER — Ambulatory Visit (INDEPENDENT_AMBULATORY_CARE_PROVIDER_SITE_OTHER): Payer: Medicaid Other | Admitting: Obstetrics and Gynecology

## 2019-12-21 ENCOUNTER — Encounter: Payer: Self-pay | Admitting: Family Medicine

## 2019-12-21 ENCOUNTER — Other Ambulatory Visit: Payer: Self-pay

## 2019-12-21 ENCOUNTER — Other Ambulatory Visit (HOSPITAL_COMMUNITY)
Admission: RE | Admit: 2019-12-21 | Discharge: 2019-12-21 | Disposition: A | Discharge status: Home / Self Care | Payer: Medicaid Other | Source: Ambulatory Visit | Attending: Obstetrics and Gynecology | Admitting: Obstetrics and Gynecology

## 2019-12-21 VITALS — BP 119/78 | HR 85 | Wt 137.6 lb

## 2019-12-21 DIAGNOSIS — Z202 Contact with and (suspected) exposure to infections with a predominantly sexual mode of transmission: Secondary | ICD-10-CM

## 2019-12-21 DIAGNOSIS — Z113 Encounter for screening for infections with a predominantly sexual mode of transmission: Secondary | ICD-10-CM | POA: Insufficient documentation

## 2019-12-21 DIAGNOSIS — Z3046 Encounter for surveillance of implantable subdermal contraceptive: Secondary | ICD-10-CM | POA: Diagnosis not present

## 2019-12-21 DIAGNOSIS — F331 Major depressive disorder, recurrent, moderate: Secondary | ICD-10-CM | POA: Diagnosis not present

## 2019-12-21 DIAGNOSIS — Z30016 Encounter for initial prescription of transdermal patch hormonal contraceptive device: Secondary | ICD-10-CM

## 2019-12-21 MED ORDER — NORELGESTROMIN-ETH ESTRADIOL 150-35 MCG/24HR TD PTWK: 1.0000 | MEDICATED_PATCH | TRANSDERMAL | 12 refills | Status: DC

## 2019-12-21 NOTE — Progress Notes (Signed)
Leah [name] is a 24 y.o. G2P1011 here for Nexplanon removal.  Last pap smear was on 2019 and was normal.  No other gynecologic concerns.   Nexplanon Removal Patient identified, informed consent performed, consent signed.   Appropriate time out taken. Nexplanon site identified.  Area prepped in usual sterile fashon. One ml of 1% lidocaine was used to anesthetize the area at the distal end of the implant. A small stab incision was made right beside the implant on the distal portion.  The Nexplanon rod was grasped using hemostats and removed without difficulty.  There was minimal blood loss. There were no complications.  3 ml of 1% lidocaine was injected around the incision for post-procedure analgesia.  Steri-strips were applied over the small incision.  A pressure bandage was applied to reduce any bruising.  The patient tolerated the procedure well and was given post procedure instructions.  Patient is planning to use Xulane for contraception/attempt conception.   Hermina Staggers, MD, FACOG Attending Obstetrician & Gynecologist Center for [facility], [facility]

## 2019-12-22 LAB — HIV ANTIBODY (ROUTINE TESTING W REFLEX): HIV Screen 4th Generation wRfx: NONREACTIVE

## 2019-12-22 LAB — HEPATITIS C ANTIBODY: Hep C Virus Ab: <0.1 s/co ratio (ref 0.0–0.9)

## 2019-12-22 LAB — CERVICOVAGINAL ANCILLARY ONLY
Bacterial Vaginitis (gardnerella): NEGATIVE
Candida Glabrata: NEGATIVE
Candida Vaginitis: POSITIVE — AB
Chlamydia: NEGATIVE
Comment: NEGATIVE
Comment: NEGATIVE
Comment: NEGATIVE
Comment: NEGATIVE
Comment: NEGATIVE
Comment: NORMAL
Neisseria Gonorrhea: NEGATIVE
Trichomonas: NEGATIVE

## 2019-12-22 LAB — RPR: RPR Ser Ql: NONREACTIVE

## 2019-12-22 LAB — HEPATITIS B SURFACE ANTIGEN: Hepatitis B Surface Ag: NEGATIVE

## 2019-12-23 MED ORDER — FLUCONAZOLE 150 MG PO TABS: 150.0000 mg | ORAL_TABLET | Freq: Once | ORAL | 1 refills | Status: AC

## 2019-12-23 NOTE — Addendum Note (Signed)
Addended by: Ed Blalock on: 12/23/2019 01:27 PM   Modules accepted: Orders

## 2020-01-13 ENCOUNTER — Other Ambulatory Visit: Payer: Self-pay

## 2020-01-13 ENCOUNTER — Emergency Department (HOSPITAL_COMMUNITY)
Admission: EM | Admit: 2020-01-13 | Discharge: 2020-01-13 | Disposition: A | Discharge status: Home / Self Care | Payer: Medicaid Other | Attending: Emergency Medicine | Admitting: Emergency Medicine

## 2020-01-13 ENCOUNTER — Encounter (HOSPITAL_COMMUNITY): Payer: Self-pay

## 2020-01-13 ENCOUNTER — Emergency Department (HOSPITAL_COMMUNITY): Payer: Medicaid Other

## 2020-01-13 DIAGNOSIS — R112 Nausea with vomiting, unspecified: Secondary | ICD-10-CM | POA: Diagnosis not present

## 2020-01-13 DIAGNOSIS — R1013 Epigastric pain: Secondary | ICD-10-CM

## 2020-01-13 DIAGNOSIS — R10816 Epigastric abdominal tenderness: Secondary | ICD-10-CM | POA: Diagnosis not present

## 2020-01-13 DIAGNOSIS — R110 Nausea: Secondary | ICD-10-CM | POA: Insufficient documentation

## 2020-01-13 DIAGNOSIS — F1290 Cannabis use, unspecified, uncomplicated: Secondary | ICD-10-CM | POA: Diagnosis not present

## 2020-01-13 DIAGNOSIS — R12 Heartburn: Secondary | ICD-10-CM | POA: Diagnosis not present

## 2020-01-13 DIAGNOSIS — R109 Unspecified abdominal pain: Secondary | ICD-10-CM | POA: Diagnosis not present

## 2020-01-13 DIAGNOSIS — R1110 Vomiting, unspecified: Secondary | ICD-10-CM | POA: Diagnosis present

## 2020-01-13 LAB — I-STAT BETA HCG BLOOD, ED (MC, WL, AP ONLY): I-stat hCG, quantitative: <5 m[IU]/mL (ref <5)

## 2020-01-13 LAB — COMPREHENSIVE METABOLIC PANEL
ALT: 37 U/L (ref 0–44)
AST: 29 U/L (ref 15–41)
Albumin: 4.4 g/dL (ref 3.5–5.0)
Alkaline Phosphatase: 73 U/L (ref 38–126)
Anion gap: 11 (ref 5–15)
BUN: 14 mg/dL (ref 6–20)
CO2: 27 mmol/L (ref 22–32)
Calcium: 9.5 mg/dL (ref 8.9–10.3)
Chloride: 102 mmol/L (ref 98–111)
Creatinine, Ser: 0.7 mg/dL (ref 0.44–1.00)
GFR calc Af Amer: >60 mL/min (ref >60)
GFR calc non Af Amer: >60 mL/min (ref >60)
Glucose, Bld: 92 mg/dL (ref 70–99)
Potassium: 3.9 mmol/L (ref 3.5–5.1)
Sodium: 140 mmol/L (ref 135–145)
Total Bilirubin: 0.6 mg/dL (ref 0.3–1.2)
Total Protein: 8.3 g/dL — ABNORMAL HIGH (ref 6.5–8.1)

## 2020-01-13 LAB — URINALYSIS, ROUTINE W REFLEX MICROSCOPIC
Bilirubin Urine: NEGATIVE
Glucose, UA: NEGATIVE mg/dL
Hgb urine dipstick: NEGATIVE
Ketones, ur: 5 mg/dL — AB
Leukocytes,Ua: NEGATIVE
Nitrite: NEGATIVE
Protein, ur: NEGATIVE mg/dL
Specific Gravity, Urine: 1.023 (ref 1.005–1.030)
pH: 6 (ref 5.0–8.0)

## 2020-01-13 LAB — CBC
HCT: 41.7 % (ref 36.0–46.0)
Hemoglobin: 13.5 g/dL (ref 12.0–15.0)
MCH: 30.2 pg (ref 26.0–34.0)
MCHC: 32.4 g/dL (ref 30.0–36.0)
MCV: 93.3 fL (ref 80.0–100.0)
Platelets: 287 10*3/uL (ref 150–400)
RBC: 4.47 MIL/uL (ref 3.87–5.11)
RDW: 13.2 % (ref 11.5–15.5)
WBC: 12.1 10*3/uL — ABNORMAL HIGH (ref 4.0–10.5)
nRBC: 0 % (ref 0.0–0.2)

## 2020-01-13 LAB — LIPASE, BLOOD: Lipase: 24 U/L (ref 11–51)

## 2020-01-13 MED ORDER — ALUM & MAG HYDROXIDE-SIMETH 200-200-20 MG/5ML PO SUSP
30.0000 mL | Freq: Once | ORAL | Status: AC
  Administered 2020-01-13: 30 mL via ORAL
  Filled 2020-01-13: qty 30

## 2020-01-13 MED ORDER — SIMETHICONE 80 MG PO CHEW
80.0000 mg | CHEWABLE_TABLET | Freq: Once | ORAL | Status: AC
  Administered 2020-01-13: 80 mg via ORAL
  Filled 2020-01-13: qty 1

## 2020-01-13 MED ORDER — SODIUM CHLORIDE 0.9% FLUSH: 3.0000 mL | Freq: Once | INTRAVENOUS | Status: DC

## 2020-01-13 MED ORDER — ONDANSETRON 4 MG PO TBDP
4.0000 mg | ORAL_TABLET | Freq: Once | ORAL | Status: AC | PRN
  Administered 2020-01-13: 4 mg via ORAL
  Filled 2020-01-13: qty 1

## 2020-01-13 MED ORDER — PANTOPRAZOLE SODIUM 40 MG PO TBEC
40.0000 mg | DELAYED_RELEASE_TABLET | Freq: Every day | ORAL | 0 refills | Status: DC
Start: 2020-01-13 — End: 2020-02-29

## 2020-01-13 MED ORDER — SUCRALFATE 1 GM/10ML PO SUSP
1.0000 g | Freq: Three times a day (TID) | ORAL | 0 refills | Status: DC
Start: 2020-01-13 — End: 2020-02-29

## 2020-01-13 MED ORDER — SUCRALFATE 1 GM/10ML PO SUSP
1.0000 g | Freq: Once | ORAL | Status: AC
  Administered 2020-01-13: 1 g via ORAL
  Filled 2020-01-13: qty 10

## 2020-01-13 MED ORDER — LIDOCAINE VISCOUS HCL 2 % MT SOLN
15.0000 mL | Freq: Once | OROMUCOSAL | Status: AC
  Administered 2020-01-13: 15 mL via ORAL
  Filled 2020-01-13: qty 15

## 2020-01-13 MED ORDER — FAMOTIDINE 20 MG PO TABS
20.0000 mg | ORAL_TABLET | Freq: Once | ORAL | Status: AC
  Administered 2020-01-13: 20 mg via ORAL
  Filled 2020-01-13: qty 1

## 2020-01-13 MED ORDER — SIMETHICONE 80 MG PO CHEW
80.0000 mg | CHEWABLE_TABLET | Freq: Four times a day (QID) | ORAL | 0 refills | Status: DC | PRN
Start: 2020-01-13 — End: 2020-02-29

## 2020-01-13 MED ORDER — ONDANSETRON 4 MG PO TBDP
4.0000 mg | ORAL_TABLET | Freq: Three times a day (TID) | ORAL | 0 refills | Status: DC | PRN
Start: 2020-01-13 — End: 2020-02-29

## 2020-01-13 MED ORDER — PANTOPRAZOLE SODIUM 40 MG PO TBEC
40.0000 mg | DELAYED_RELEASE_TABLET | Freq: Once | ORAL | Status: AC
  Administered 2020-01-13: 40 mg via ORAL
  Filled 2020-01-13: qty 1

## 2020-01-13 NOTE — ED Provider Notes (Signed)
Care of the patient was assumed from S.Petrucelli PA-C at 630; see this providers note for complete history of present illness, review of systems, and physical exam.  Briefly, the patient is a 24 y.o. female with pertinent past medical history of bipolar 1, GERD, anemia, anxiety, pseudoseizures, prior cholecystectomy who presented to the ED with abdominal pain and emesis.  Patient describes GERD-like symptoms with burning, worse when she lays down at night.  Was aggravated last night with garlic bread prior to bed last night.  States that this feels similar to when she had heartburn pregnancy in the past.  Patient was given GI cocktail, Protonix, Pepcid, Carafate.  Plan at time of handoff:  X-ray to assess for perforation although this is unlikely.  Patient can go home after x-ray with acid reflux medications.   Physical Exam  BP 116/76 (BP Location: Right Arm)   Pulse 68   Temp 98 F (36.7 C) (Oral)   Resp 18   SpO2 100%   Physical Exam Constitutional:      General: She is not in acute distress.    Appearance: Normal appearance. She is not ill-appearing, toxic-appearing or diaphoretic.  HENT:     Head: Normocephalic and atraumatic.  Eyes:     Extraocular Movements: Extraocular movements intact.     Pupils: Pupils are equal, round, and reactive to light.  Cardiovascular:     Rate and Rhythm: Normal rate and regular rhythm.     Pulses: Normal pulses.  Pulmonary:     Effort: Pulmonary effort is normal.     Breath sounds: Normal breath sounds.  Abdominal:     General: Abdomen is flat. There is no distension.     Palpations: Abdomen is soft. There is no mass.     Tenderness: There is no abdominal tenderness. There is no right CVA tenderness, left CVA tenderness, guarding or rebound.     Hernia: No hernia is present.  Musculoskeletal:        General: Normal range of motion.  Skin:    General: Skin is warm and dry.     Capillary Refill: Capillary refill takes less than 2 seconds.   Neurological:     General: No focal deficit present.     Mental Status: She is alert and oriented to person, place, and time.  Psychiatric:        Mood and Affect: Mood normal.        Behavior: Behavior normal.        Thought Content: Thought content normal.     ED Course/Procedures     Procedures  MDM  Patient is a 24 year old female that presents with GERD-like symptoms, was seen by previous PA, see their note for full HPI.  X-ray does not show any free air, cardiopulmonary disease.  Repeat physical exam without any signs of surgical abdomen.  Patient states that she is willing to be discharged with PCP follow-up and acid reflux medications at this time.  Was able to pass p.o. challenge.  Patient appears comfortable, agreeable discharge at this time. Labs reassuring. Doubt need for further emergent work up at this time. I explained the diagnosis and have given explicit precautions to return to the ER including for any other new or worsening symptoms. The patient understands and accepts the medical plan as it's been dictated and I have answered their questions. Discharge instructions concerning home care and prescriptions have been given. The patient is STABLE and is discharged to home in good condition.  Alfredia Client, PA-C 01/13/20 0511    Quintella Reichert, MD 01/13/20 1008

## 2020-01-13 NOTE — ED Triage Notes (Signed)
Pt complains of heart burn fo rone week and this am she vomited

## 2020-01-13 NOTE — Discharge Instructions (Addendum)
You were seen in the emergency department today with abdominal pain and episode of vomiting.  Your work-up was overall reassuring.  We are sending you with the following medicines to help with your symptoms: -Protonix: Take once per day prior to meals with stomach acidity -Carafate: Take prior to meals and prior to bedtime to help with stomach acidity -Zofran: Take every 8 hours as needed for nausea and vomiting -Simethicone: Gas-X: Take every 6 hours as needed for gas.  We have prescribed you new medication(s) today. Discuss the medications prescribed today with your pharmacist as they can have adverse effects and interactions with your other medicines including over the counter and prescribed medications. Seek medical evaluation if you start to experience new or abnormal symptoms after taking one of these medicines, seek care immediately if you start to experience difficulty breathing, feeling of your throat closing, facial swelling, or rash as these could be indications of a more serious allergic reaction  Follow attached diet guidelines.  Follow with your primary care provider within 3 days for reevaluation.  Return to the ER for new or worsening symptoms including but not limited to worsening pain, inability to keep fluids down, fever, blood in vomit or stool, trouble breathing, or any other concerns.

## 2020-01-13 NOTE — ED Provider Notes (Signed)
[facility]-EMERGENCY DEPT Provider Note   CSN: [id number] Arrival date & time: 01/13/20  3785     History Chief Complaint  Patient presents with  . Emesis    Leah [name] is a 24 y.o. female with a history of bipolar 1 disorder, GERD, anemia, anxiety, pseudoseizures, and prior cholecystectomy who presents to the emergency department with complaints of abdominal pain for the last 1 week and an episode of emesis tonight.  Patient states she has been having issues with abdominal discomfort in the epigastric region described as burning intermittently, worse when she lays down at night, no alleviating factors.  Tonight she woke up with worsening pain and an episode of vomiting prompting ED visit.  She states that she ate garlic bread prior to bed last night.  She denies fever, chills, hematemesis, melena, hematochezia, diarrhea, dysuria, vaginal bleeding, or vaginal discharge.  She had similar symptoms with heartburn in pregnancy in the past.  HPI     Past Medical History:  Diagnosis Date  . Anemia 2013  . Anxiety 2011   no meds during pregnancy  . Bipolar 1 disorder (HCC)   . Chlamydia   . Gall stones   . GERD (gastroesophageal reflux disease)   . 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 Seizure [facility]) 2011   pseudoseizures    Patient Active Problem List   Diagnosis Date Noted  . STD exposure 12/21/2019  . Implanon removal 12/21/2019  . Severe recurrent major depression without psychotic features (HCC) 12/31/2018  . S/P laparoscopic cholecystectomy 10/22/2017  . Amenorrhea, secondary 09/12/2017  . 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 Suicide attempt by drug ingestion (HCC)   . Contraception management 03/10/2014  . Biliary colic 03/10/2014  . Depression 03/16/2013  . Allergic rhinitis, seasonal 01/26/2013  . Anemia, iron deficiency 12/01/2012    Past Surgical History:  Procedure  Laterality Date  . CHOLECYSTECTOMY N/A 09/28/2014   Procedure: LAPAROSCOPIC CHOLECYSTECTOMY;  Surgeon: Axel Filler, MD;  Location: [facility] OR;  Service: General;  Laterality: N/A;  . CHOLECYSTECTOMY    . DIRECT LARYNGOSCOPY N/A 04/24/2014   Procedure: DIRECT LARYNGOSCOPY;  Surgeon: [name], MD;  Location: [facility] OR;  Service: ENT;  Laterality: N/A;  . FOREIGN BODY REMOVAL ESOPHAGEAL N/A 04/24/2014   Procedure: REMOVAL FOREIGN BODY ESOPHAGEAL;  Surgeon: [name], MD;  Location: [facility] OR;  Service: ENT;  Laterality: N/A;  . WISDOM TOOTH EXTRACTION  2013     OB History    Gravida  2   Para  1   Term  1   Preterm  0   AB  1   Living  1     SAB  1   TAB  0   Ectopic  0   Multiple      Live Births  1           Family History  Problem Relation Age of Onset  . Arthritis Mother   . Bronchitis Mother   . Asthma Mother   . Hearing loss Paternal Grandfather   . Diabetes Neg Hx   . Stomach cancer Neg Hx   . Colon cancer Neg Hx     Social History   Tobacco Use  . Smoking status: Never Smoker  . Smokeless tobacco: Never Used  . Tobacco comment: marjiuna  Vaping Use  . Vaping Use: Never used  Substance Use Topics  . Alcohol  use: No    Alcohol/week: 6.0 standard drinks    Types: 6 Cans of beer per week  . Drug use: Yes    Types: Marijuana    Comment: occ.    Home Medications Prior to Admission medications   Medication Sig Start Date End Date Taking? Authorizing Provider  hydrocortisone 2.5 % lotion Apply topically 2 (two) times daily. Patient not taking: Reported on 12/21/2019 12/17/19   Couture, Cortni S, PA-C  norelgestromin-ethinyl estradiol (ORTHO EVRA) 150-35 MCG/24HR transdermal patch Place 1 patch onto the skin once a week. 12/21/19   Chancy Milroy, MD  OLANZapine (ZYPREXA) 10 MG tablet Take 10 mg by mouth at bedtime.    [provider]    Allergies    Bactrim [sulfamethoxazole-trimethoprim]  Review of Systems   Review of Systems    Constitutional: Negative for chills and fever.  Respiratory: Negative for shortness of breath.   Cardiovascular: Negative for chest pain.  Gastrointestinal: Positive for abdominal pain, nausea and vomiting. Negative for anal bleeding, blood in stool, constipation and diarrhea.  Genitourinary: Negative for dysuria, vaginal bleeding and vaginal discharge.  Neurological: Negative for syncope.  All other systems reviewed and are negative.   Physical Exam Updated Vital Signs BP 116/76 (BP Location: Right Arm)   Pulse 68   Temp 98 F (36.7 C) (Oral)   Resp 18   SpO2 100%   Physical Exam Vitals and nursing note reviewed.  Constitutional:      General: She is not in acute distress.    Appearance: She is well-developed. She is not toxic-appearing.  HENT:     Head: Normocephalic and atraumatic.  Eyes:     General:        Right eye: No discharge.        Left eye: No discharge.     Conjunctiva/sclera: Conjunctivae normal.  Cardiovascular:     Rate and Rhythm: Normal rate and regular rhythm.  Pulmonary:     Effort: Pulmonary effort is normal. No respiratory distress.     Breath sounds: Normal breath sounds. No wheezing, rhonchi or rales.  Abdominal:     General: There is no distension.     Palpations: Abdomen is soft.     Tenderness: There is abdominal tenderness (epigastrium). There is no guarding or rebound.  Musculoskeletal:     Cervical back: Neck supple.  Skin:    General: Skin is warm and dry.     Findings: No rash.  Neurological:     Mental Status: She is alert.     Comments: Clear speech.   Psychiatric:        Behavior: Behavior normal.     ED Results / Procedures / Treatments   Labs (all labs ordered are listed, but only abnormal results are displayed) Labs Reviewed  COMPREHENSIVE METABOLIC PANEL - Abnormal; Notable for the following components:      Result Value   Total Protein 8.3 (*)    All other components within normal limits  CBC - Abnormal; Notable for  the following components:   WBC 12.1 (*)    All other components within normal limits  URINALYSIS, ROUTINE W REFLEX MICROSCOPIC - Abnormal; Notable for the following components:   APPearance HAZY (*)    Ketones, ur 5 (*)    All other components within normal limits  LIPASE, BLOOD  I-STAT BETA HCG BLOOD, ED ([facility], [facility], [facility] ONLY)    EKG None  Radiology No results found.  Procedures Procedures (including critical care  time)  Medications Ordered in ED Medications  sodium chloride flush (NS) 0.9 % injection 3 mL (has no administration in time range)  ondansetron (ZOFRAN-ODT) disintegrating tablet 4 mg (4 mg Oral Given 01/13/20 0440)  alum & mag hydroxide-simeth (MAALOX/MYLANTA) 200-200-20 MG/5ML suspension 30 mL (30 mLs Oral Given 01/13/20 0524)    And  lidocaine (XYLOCAINE) 2 % viscous mouth solution 15 mL (15 mLs Oral Given 01/13/20 0524)  pantoprazole (PROTONIX) EC tablet 40 mg (40 mg Oral Given 01/13/20 7564)    ED Course  I have reviewed the triage vital signs and the nursing notes.  Pertinent labs & imaging results that were available during my care of the patient were reviewed by me and considered in my medical decision making (see chart for details).    MDM Rules/Calculators/A&P                         Patient presents to the ED with complaints of abdominal pain & 1 episode of emesis.  Patient is nontoxic, resting comfortably, vitals within normal limits.  On exam patient has epigastric tenderness without peritoneal signs.  Ddx: GERD, PUD, pancreatitis, viral GI illness, perf, obstruction, status post cholecystectomy therefore do not suspect cholelithiasis/cholecystitis/cholangitis.   Additional history obtained:  Additional history obtained from nursing notes and chart review. Lab Tests:  I reviewed and interpreted labs, which included:  CBC: Mild leukocytosis at 12.1.  No anemia. CMP: No significant electrolyte derangement.  Renal function and LFTs within normal  limits. Lipase: Within normal limits Pregnancy test: Negative Urinalysis: No UTI.  ED Course:  Following initial assessment I have ordered a GI cocktail & protonix in addition to nursing administered zofran.   07:10: RE-EVAL: Patient without much change in sxs, states now feels more like pressure/gas feeling in epigastrium/lower chest. Will obtain chest/abdominal series x-ray to ensure no obvious free air or other acute process & EKG. Trial of Gas-X. Sxs worse with attempting to drink currently, but is tolerating PO.   07:20: Patient care signed out to [facility] PA-C at change of shift pending x-ray, EKG, and re-assessment, anticipate discharge home.   Portions of this note were generated with Scientist, clinical (histocompatibility and immunogenetics). Dictation errors may occur despite best attempts at proofreading.  Final Clinical Impression(s) / ED Diagnoses Final diagnoses:  Epigastric pain  Non-intractable vomiting with nausea, unspecified vomiting type    Rx / DC Orders ED Discharge Orders         Ordered    pantoprazole (PROTONIX) 40 MG tablet  Daily     Discontinue  Reprint     01/13/20 0714    ondansetron (ZOFRAN ODT) 4 MG disintegrating tablet  Every 8 hours PRN     Discontinue  Reprint     01/13/20 0714    simethicone (GAS-X) 80 MG chewable tablet  Every 6 hours PRN     Discontinue  Reprint     01/13/20 0714    sucralfate (CARAFATE) 1 GM/10ML suspension  3 times daily with meals & bedtime     Discontinue  Reprint     01/13/20 0714           Cherly Anderson, PA-C 01/13/20 3329    Marily Memos, MD 01/13/20 [phone]

## 2020-01-17 ENCOUNTER — Ambulatory Visit: Payer: Medicaid Other | Admitting: Obstetrics and Gynecology

## 2020-01-19 ENCOUNTER — Ambulatory Visit: Payer: Medicaid Other

## 2020-01-20 DIAGNOSIS — F331 Major depressive disorder, recurrent, moderate: Secondary | ICD-10-CM | POA: Diagnosis not present

## 2020-01-24 ENCOUNTER — Other Ambulatory Visit: Payer: Self-pay

## 2020-01-24 ENCOUNTER — Emergency Department (HOSPITAL_COMMUNITY)
Admission: EM | Admit: 2020-01-24 | Discharge: 2020-01-24 | Disposition: A | Discharge status: Home / Self Care | Payer: Medicaid Other | Attending: Emergency Medicine | Admitting: Emergency Medicine

## 2020-01-24 ENCOUNTER — Encounter (HOSPITAL_COMMUNITY): Payer: Self-pay | Admitting: Emergency Medicine

## 2020-01-24 DIAGNOSIS — R112 Nausea with vomiting, unspecified: Secondary | ICD-10-CM | POA: Diagnosis not present

## 2020-01-24 DIAGNOSIS — N390 Urinary tract infection, site not specified: Secondary | ICD-10-CM

## 2020-01-24 DIAGNOSIS — K219 Gastro-esophageal reflux disease without esophagitis: Secondary | ICD-10-CM | POA: Insufficient documentation

## 2020-01-24 DIAGNOSIS — R1030 Lower abdominal pain, unspecified: Secondary | ICD-10-CM | POA: Diagnosis not present

## 2020-01-24 DIAGNOSIS — R10815 Periumbilic abdominal tenderness: Secondary | ICD-10-CM | POA: Diagnosis not present

## 2020-01-24 DIAGNOSIS — B9689 Other specified bacterial agents as the cause of diseases classified elsewhere: Secondary | ICD-10-CM | POA: Insufficient documentation

## 2020-01-24 LAB — URINALYSIS, ROUTINE W REFLEX MICROSCOPIC
Bilirubin Urine: NEGATIVE
Glucose, UA: NEGATIVE mg/dL
Hgb urine dipstick: NEGATIVE
Ketones, ur: 80 mg/dL — AB
Nitrite: NEGATIVE
Protein, ur: 30 mg/dL — AB
Specific Gravity, Urine: 1.028 (ref 1.005–1.030)
pH: 5 (ref 5.0–8.0)

## 2020-01-24 LAB — COMPREHENSIVE METABOLIC PANEL
ALT: 19 U/L (ref 0–44)
AST: 18 U/L (ref 15–41)
Albumin: 4.4 g/dL (ref 3.5–5.0)
Alkaline Phosphatase: 57 U/L (ref 38–126)
Anion gap: 10 (ref 5–15)
BUN: 23 mg/dL — ABNORMAL HIGH (ref 6–20)
CO2: 22 mmol/L (ref 22–32)
Calcium: 9.2 mg/dL (ref 8.9–10.3)
Chloride: 106 mmol/L (ref 98–111)
Creatinine, Ser: 0.82 mg/dL (ref 0.44–1.00)
GFR calc Af Amer: >60 mL/min (ref >60)
GFR calc non Af Amer: >60 mL/min (ref >60)
Glucose, Bld: 84 mg/dL (ref 70–99)
Potassium: 3.6 mmol/L (ref 3.5–5.1)
Sodium: 138 mmol/L (ref 135–145)
Total Bilirubin: 0.9 mg/dL (ref 0.3–1.2)
Total Protein: 8 g/dL (ref 6.5–8.1)

## 2020-01-24 LAB — LIPASE, BLOOD: Lipase: 24 U/L (ref 11–51)

## 2020-01-24 LAB — CBC
HCT: 36.1 % (ref 36.0–46.0)
Hemoglobin: 12.1 g/dL (ref 12.0–15.0)
MCH: 30.5 pg (ref 26.0–34.0)
MCHC: 33.5 g/dL (ref 30.0–36.0)
MCV: 90.9 fL (ref 80.0–100.0)
Platelets: 296 10*3/uL (ref 150–400)
RBC: 3.97 MIL/uL (ref 3.87–5.11)
RDW: 12.9 % (ref 11.5–15.5)
WBC: 10.5 10*3/uL (ref 4.0–10.5)
nRBC: 0 % (ref 0.0–0.2)

## 2020-01-24 LAB — I-STAT BETA HCG BLOOD, ED (MC, WL, AP ONLY): I-stat hCG, quantitative: <5 m[IU]/mL (ref <5)

## 2020-01-24 MED ORDER — CEPHALEXIN 500 MG PO CAPS
500.0000 mg | ORAL_CAPSULE | Freq: Three times a day (TID) | ORAL | 0 refills | Status: DC
Start: 2020-01-24 — End: 2020-02-29

## 2020-01-24 MED ORDER — LACTATED RINGERS IV BOLUS
1000.0000 mL | Freq: Once | INTRAVENOUS | Status: AC
  Administered 2020-01-24: 1000 mL via INTRAVENOUS

## 2020-01-24 MED ORDER — SODIUM CHLORIDE 0.9 % IV SOLN
1.0000 g | Freq: Once | INTRAVENOUS | Status: AC
  Administered 2020-01-24: 1 g via INTRAVENOUS
  Filled 2020-01-24: qty 10

## 2020-01-24 MED ORDER — SODIUM CHLORIDE 0.9% FLUSH
3.0000 mL | Freq: Once | INTRAVENOUS | Status: AC
  Administered 2020-01-24: 3 mL via INTRAVENOUS

## 2020-01-24 MED ORDER — HYDROMORPHONE HCL 1 MG/ML IJ SOLN
0.5000 mg | Freq: Once | INTRAMUSCULAR | Status: AC
  Administered 2020-01-24: 0.5 mg via INTRAVENOUS
  Filled 2020-01-24: qty 1

## 2020-01-24 MED ORDER — FAMOTIDINE IN NACL 20-0.9 MG/50ML-% IV SOLN
20.0000 mg | Freq: Once | INTRAVENOUS | Status: AC
  Administered 2020-01-24: 20 mg via INTRAVENOUS
  Filled 2020-01-24: qty 50

## 2020-01-24 MED ORDER — PROMETHAZINE HCL 25 MG PO TABS
25.0000 mg | ORAL_TABLET | Freq: Four times a day (QID) | ORAL | 0 refills | Status: DC | PRN
Start: 2020-01-24 — End: 2020-02-01

## 2020-01-24 MED ORDER — ONDANSETRON HCL 4 MG/2ML IJ SOLN
4.0000 mg | Freq: Once | INTRAMUSCULAR | Status: AC
  Administered 2020-01-24: 4 mg via INTRAVENOUS
  Filled 2020-01-24: qty 2

## 2020-01-24 MED ORDER — KETOROLAC TROMETHAMINE 15 MG/ML IJ SOLN
15.0000 mg | Freq: Once | INTRAMUSCULAR | Status: AC
  Administered 2020-01-24: 15 mg via INTRAVENOUS
  Filled 2020-01-24: qty 1

## 2020-01-24 MED ORDER — HYDROCODONE-ACETAMINOPHEN 5-325 MG PO TABS: 1.0000 | ORAL_TABLET | Freq: Four times a day (QID) | ORAL | 0 refills | Status: DC | PRN

## 2020-01-24 MED ORDER — HYDROMORPHONE HCL 1 MG/ML IJ SOLN
1.0000 mg | Freq: Once | INTRAMUSCULAR | Status: AC
  Administered 2020-01-24: 1 mg via INTRAVENOUS
  Filled 2020-01-24: qty 1

## 2020-01-24 MED ORDER — PROMETHAZINE HCL 25 MG/ML IJ SOLN
12.5000 mg | Freq: Once | INTRAMUSCULAR | Status: AC
  Administered 2020-01-24: 12.5 mg via INTRAVENOUS
  Filled 2020-01-24: qty 1

## 2020-01-24 NOTE — ED Triage Notes (Signed)
Pt c/o vomiting since Saturday. Denies any other symptoms. Pt took Zofran at 2am today at not helping.

## 2020-01-24 NOTE — ED Notes (Signed)
Pt unable to void; will continue to monitor.  

## 2020-01-25 NOTE — ED Provider Notes (Signed)
[facility]-EMERGENCY DEPT Provider Note   CSN: [id number] Arrival date & time: 01/24/20  0730     History Chief Complaint  Patient presents with  . Emesis    Leah [name] is a 24 y.o. female.  HPI   24 year old female with nausea and vomiting.  Onset Saturday.  Persistent since.  She took a dose of Zofran very early this morning but it did not significantly help her symptoms.  Denies any significant pain.  No unusual vaginal bleeding or discharge.  Urinating little bit more frequently than normal, but no dysuria.  No sick contacts that she is aware of.  Past Medical History:  Diagnosis Date  . Anemia 2013  . Anxiety 2011   no meds during pregnancy  . Bipolar 1 disorder (HCC)   . Chlamydia   . Gall stones   . GERD (gastroesophageal reflux disease)   . 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 Seizure [facility]) 2011   pseudoseizures    Patient Active Problem List   Diagnosis Date Noted  . STD exposure 12/21/2019  . Implanon removal 12/21/2019  . Severe recurrent major depression without psychotic features (HCC) 12/31/2018  . S/P laparoscopic cholecystectomy 10/22/2017  . Amenorrhea, secondary 09/12/2017  . 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 Suicide attempt by drug ingestion (HCC)   . Contraception management 03/10/2014  . Biliary colic 03/10/2014  . Depression 03/16/2013  . Allergic rhinitis, seasonal 01/26/2013  . Anemia, iron deficiency 12/01/2012    Past Surgical History:  Procedure Laterality Date  . CHOLECYSTECTOMY N/A 09/28/2014   Procedure: LAPAROSCOPIC CHOLECYSTECTOMY;  Surgeon: Axel Filler, MD;  Location: [facility] OR;  Service: General;  Laterality: N/A;  . CHOLECYSTECTOMY    . DIRECT LARYNGOSCOPY N/A 04/24/2014   Procedure: DIRECT LARYNGOSCOPY;  Surgeon: [name], MD;  Location: [facility] OR;  Service: ENT;  Laterality: N/A;  . FOREIGN BODY REMOVAL ESOPHAGEAL N/A 04/24/2014     Procedure: REMOVAL FOREIGN BODY ESOPHAGEAL;  Surgeon: [name], MD;  Location: [facility] OR;  Service: ENT;  Laterality: N/A;  . WISDOM TOOTH EXTRACTION  2013     OB History    Gravida  2   Para  1   Term  1   Preterm  0   AB  1   Living  1     SAB  1   TAB  0   Ectopic  0   Multiple      Live Births  1           Family History  Problem Relation Age of Onset  . Arthritis Mother   . Bronchitis Mother   . Asthma Mother   . Hearing loss Paternal Grandfather   . Diabetes Neg Hx   . Stomach cancer Neg Hx   . Colon cancer Neg Hx     Social History   Tobacco Use  . Smoking status: Never Smoker  . Smokeless tobacco: Never Used  . Tobacco comment: marjiuna  Vaping Use  . Vaping Use: Never used  Substance Use Topics  . Alcohol use: No    Alcohol/week: 6.0 standard drinks    Types: 6 Cans of beer per week  . Drug use: Yes    Types: Marijuana    Comment: occ.    Home Medications Prior to Admission medications   Medication Sig Start Date End Date Taking? Authorizing Provider  norelgestromin-ethinyl estradiol (ORTHO  EVRA) 150-35 MCG/24HR transdermal patch Place 1 patch onto the skin once a week. 12/21/19  Yes Hermina Staggers, MD  OLANZapine (ZYPREXA) 10 MG tablet Take 5 mg by mouth at bedtime.    Yes [provider]  cephALEXin (KEFLEX) 500 MG capsule Take 1 capsule (500 mg total) by mouth 3 (three) times daily. 01/24/20   Raeford Razor, MD  HYDROcodone-acetaminophen (NORCO/VICODIN) 5-325 MG tablet Take 1 tablet by mouth every 6 (six) hours as needed. 01/24/20   Raeford Razor, MD  hydrocortisone 2.5 % lotion Apply topically 2 (two) times daily. Patient not taking: Reported on 12/21/2019 12/17/19   Couture, Cortni S, PA-C  ondansetron (ZOFRAN ODT) 4 MG disintegrating tablet Take 1 tablet (4 mg total) by mouth every 8 (eight) hours as needed for nausea or vomiting. Patient not taking: Reported on 01/24/2020 01/13/20   Petrucelli, Pleas Koch, PA-C  pantoprazole  (PROTONIX) 40 MG tablet Take 1 tablet (40 mg total) by mouth daily. Patient not taking: Reported on 01/24/2020 01/13/20   Petrucelli, Pleas Koch, PA-C  promethazine (PHENERGAN) 25 MG tablet Take 1 tablet (25 mg total) by mouth every 6 (six) hours as needed for nausea or vomiting. 01/24/20   Raeford Razor, MD  simethicone (GAS-X) 80 MG chewable tablet Chew 1 tablet (80 mg total) by mouth every 6 (six) hours as needed for flatulence. Patient not taking: Reported on 01/24/2020 01/13/20   Petrucelli, Lelon Mast R, PA-C  sucralfate (CARAFATE) 1 GM/10ML suspension Take 10 mLs (1 g total) by mouth 4 (four) times daily -  with meals and at bedtime. Patient not taking: Reported on 01/24/2020 01/13/20   Petrucelli, Pleas Koch, PA-C    Allergies    Bactrim [sulfamethoxazole-trimethoprim]  Review of Systems   Review of Systems All systems reviewed and negative, other than as noted in HPI. Physical Exam Updated Vital Signs BP 100/62   Pulse 63   Temp 98.5 F (36.9 C) (Oral)   Resp 16   SpO2 98%   Physical Exam Vitals and nursing note reviewed.  Constitutional:      General: She is not in acute distress.    Appearance: She is well-developed.  HENT:     Head: Normocephalic and atraumatic.  Eyes:     General:        Right eye: No discharge.        Left eye: No discharge.     Conjunctiva/sclera: Conjunctivae normal.  Cardiovascular:     Rate and Rhythm: Normal rate and regular rhythm.     Heart sounds: Normal heart sounds. No murmur heard.  No friction rub. No gallop.   Pulmonary:     Effort: Pulmonary effort is normal. No respiratory distress.     Breath sounds: Normal breath sounds.  Abdominal:     General: There is no distension.     Palpations: Abdomen is soft.     Tenderness: There is abdominal tenderness.     Comments: Mild periumbilical and suprapubic tenderness without rebound or guarding.  Musculoskeletal:        General: No tenderness.     Cervical back: Neck supple.  Skin:     General: Skin is warm and dry.  Neurological:     Mental Status: She is alert.  Psychiatric:        Behavior: Behavior normal.        Thought Content: Thought content normal.     ED Results / Procedures / Treatments   Labs (all labs ordered are listed, but  only abnormal results are displayed) Labs Reviewed  COMPREHENSIVE METABOLIC PANEL - Abnormal; Notable for the following components:      Result Value   BUN 23 (*)    All other components within normal limits  URINALYSIS, ROUTINE W REFLEX MICROSCOPIC - Abnormal; Notable for the following components:   APPearance CLOUDY (*)    Ketones, ur 80 (*)    Protein, ur 30 (*)    Leukocytes,Ua LARGE (*)    Bacteria, UA FEW (*)    All other components within normal limits  URINE CULTURE  LIPASE, BLOOD  CBC  I-STAT BETA HCG BLOOD, ED ([facility], [facility], [facility] ONLY)    EKG None  Radiology No results found.  Procedures Procedures (including critical care time)  Medications Ordered in ED Medications  sodium chloride flush (NS) 0.9 % injection 3 mL (3 mLs Intravenous Given 01/24/20 0805)  lactated ringers bolus 1,000 mL (0 mLs Intravenous Stopped 01/24/20 0921)  promethazine (PHENERGAN) injection 12.5 mg (12.5 mg Intravenous Given 01/24/20 0917)  famotidine (PEPCID) IVPB 20 mg premix ( Intravenous Stopped 01/24/20 1225)  HYDROmorphone (DILAUDID) injection 0.5 mg (0.5 mg Intravenous Given 01/24/20 1150)  ondansetron (ZOFRAN) injection 4 mg (4 mg Intravenous Given 01/24/20 1146)  cefTRIAXone (ROCEPHIN) 1 g in sodium chloride 0.9 % 100 mL IVPB (0 g Intravenous Stopped 01/24/20 1513)  ketorolac (TORADOL) 15 MG/ML injection 15 mg (15 mg Intravenous Given 01/24/20 1526)  HYDROmorphone (DILAUDID) injection 1 mg (1 mg Intravenous Given 01/24/20 1526)    ED Course  I have reviewed the triage vital signs and the nursing notes.  Pertinent labs & imaging results that were available during my care of the patient were reviewed by me and considered in my medical decision  making (see chart for details).    MDM Rules/Calculators/A&P                          24 year old female with nausea and vomiting.  Now improved.  Ate a [place] sandwich.  Initially she was not complaining of any abdominal pain but then developed some during the course of her ED stay.  She may potentially have a UTI.  We will put her on antibiotics.  Urine culture.  Return precautions discussed.  Symptomatic treatment otherwise.  Advance diet as tolerated.  Final Clinical Impression(s) / ED Diagnoses Final diagnoses:  Nausea and vomiting, intractability of vomiting not specified, unspecified vomiting type  Urinary tract infection without hematuria, site unspecified    Rx / DC Orders ED Discharge Orders         Ordered    HYDROcodone-acetaminophen (NORCO/VICODIN) 5-325 MG tablet  Every 6 hours PRN     Discontinue  Reprint     01/24/20 1526    cephALEXin (KEFLEX) 500 MG capsule  3 times daily     Discontinue  Reprint     01/24/20 1526    promethazine (PHENERGAN) 25 MG tablet  Every 6 hours PRN     Discontinue  Reprint     01/24/20 1526           Raeford Razor, MD 01/25/20 [id number]

## 2020-01-26 LAB — URINE CULTURE: Culture: 80000 — AB

## 2020-01-27 ENCOUNTER — Ambulatory Visit (INDEPENDENT_AMBULATORY_CARE_PROVIDER_SITE_OTHER): Payer: Medicaid Other | Admitting: *Deleted

## 2020-01-27 ENCOUNTER — Telehealth: Payer: Self-pay

## 2020-01-27 ENCOUNTER — Other Ambulatory Visit: Payer: Self-pay

## 2020-01-27 ENCOUNTER — Other Ambulatory Visit (HOSPITAL_COMMUNITY)
Admission: RE | Admit: 2020-01-27 | Discharge: 2020-01-27 | Disposition: A | Discharge status: Home / Self Care | Payer: Medicaid Other | Source: Ambulatory Visit | Attending: Family Medicine | Admitting: Family Medicine

## 2020-01-27 DIAGNOSIS — N898 Other specified noninflammatory disorders of vagina: Secondary | ICD-10-CM | POA: Diagnosis not present

## 2020-01-27 DIAGNOSIS — Z113 Encounter for screening for infections with a predominantly sexual mode of transmission: Secondary | ICD-10-CM

## 2020-01-27 DIAGNOSIS — A749 Chlamydial infection, unspecified: Secondary | ICD-10-CM

## 2020-01-27 DIAGNOSIS — N760 Acute vaginitis: Secondary | ICD-10-CM

## 2020-01-27 DIAGNOSIS — B9689 Other specified bacterial agents as the cause of diseases classified elsewhere: Secondary | ICD-10-CM

## 2020-01-27 NOTE — Progress Notes (Signed)
Here for nurse visit for  std testing. C/o vaginal itching but no discharge. States took diflucan recently but had intercourse too soon and is still itching. Also wants to be tested for [facility], [place], yeast because has new partner. Instructed to obtain self swab per protocol. Informed if anything + we will contact her re; treatment.  Wai Minotti,RN

## 2020-01-27 NOTE — Progress Notes (Signed)
Patient seen and assessed by nursing staff.  Agree with documentation and plan.  

## 2020-01-27 NOTE — Telephone Encounter (Signed)
Post ED Visit - Positive Culture Follow-up: Unsuccessful Patient Follow-up  Culture assessed and recommendations reviewed by:  []  , Pharm.D. []  Enzo Bi, Pharm.D., BCPS AQ-ID []  , Pharm.D., BCPS []  Celedonio Miyamoto, Pharm.D., BCPS []  Andrews, Garvin Fila.D., BCPS, AAHIVP []  , Pharm.D., BCPS, AAHIVP []  Georgina Pillion, PharmD []  , PharmD, BCPS [place] Positive urine culture Symptom check  May need abx change to Cipro 500 mg BID x 7 days per [place] MD []  Patient discharged without antimicrobial prescription and treatment is now indicated []  Organism is resistant to prescribed ED discharge antimicrobial []  Patient with positive blood cultures   Unable to contact patient after 3 attempts, letter will be sent to address on file  01/27/2020, 12:55 PM

## 2020-01-31 LAB — CERVICOVAGINAL ANCILLARY ONLY
Candida Glabrata: NEGATIVE
Candida Vaginitis: POSITIVE — AB
Chlamydia: POSITIVE — AB
Comment: NEGATIVE
Comment: NEGATIVE
Comment: NEGATIVE
Comment: NEGATIVE
Comment: NORMAL
Neisseria Gonorrhea: NEGATIVE
Trichomonas: NEGATIVE

## 2020-01-31 MED ORDER — AZITHROMYCIN 500 MG PO TABS: 1000.0000 mg | ORAL_TABLET | Freq: Once | ORAL | 1 refills | Status: AC

## 2020-01-31 MED ORDER — FLUCONAZOLE 150 MG PO TABS: 150.0000 mg | ORAL_TABLET | Freq: Every day | ORAL | 2 refills | Status: DC

## 2020-01-31 NOTE — Addendum Note (Signed)
Addended by: Reva Bores on: 01/31/2020 04:46 PM   Modules accepted: Orders

## 2020-02-01 ENCOUNTER — Encounter (HOSPITAL_COMMUNITY): Payer: Self-pay | Admitting: *Deleted

## 2020-02-01 ENCOUNTER — Encounter (HOSPITAL_COMMUNITY): Payer: Self-pay

## 2020-02-01 ENCOUNTER — Other Ambulatory Visit: Payer: Self-pay

## 2020-02-01 ENCOUNTER — Emergency Department (HOSPITAL_COMMUNITY)
Admission: EM | Admit: 2020-02-01 | Discharge: 2020-02-01 | Discharge status: Against Medical Advice | Payer: Medicaid Other | Attending: Emergency Medicine | Admitting: Emergency Medicine

## 2020-02-01 ENCOUNTER — Emergency Department (HOSPITAL_COMMUNITY): Payer: Medicaid Other

## 2020-02-01 ENCOUNTER — Emergency Department (HOSPITAL_COMMUNITY)
Admission: EM | Admit: 2020-02-01 | Discharge: 2020-02-01 | Disposition: A | Discharge status: Home / Self Care | Payer: Medicaid Other | Source: Home / Self Care | Attending: Emergency Medicine | Admitting: Emergency Medicine

## 2020-02-01 ENCOUNTER — Telehealth: Payer: Self-pay | Admitting: Family Medicine

## 2020-02-01 DIAGNOSIS — A749 Chlamydial infection, unspecified: Secondary | ICD-10-CM

## 2020-02-01 DIAGNOSIS — R112 Nausea with vomiting, unspecified: Secondary | ICD-10-CM | POA: Diagnosis not present

## 2020-02-01 DIAGNOSIS — R1110 Vomiting, unspecified: Secondary | ICD-10-CM | POA: Diagnosis not present

## 2020-02-01 DIAGNOSIS — R1084 Generalized abdominal pain: Secondary | ICD-10-CM | POA: Diagnosis not present

## 2020-02-01 DIAGNOSIS — Z5321 Procedure and treatment not carried out due to patient leaving prior to being seen by health care provider: Secondary | ICD-10-CM | POA: Insufficient documentation

## 2020-02-01 DIAGNOSIS — R1033 Periumbilical pain: Secondary | ICD-10-CM | POA: Diagnosis present

## 2020-02-01 LAB — COMPREHENSIVE METABOLIC PANEL
ALT: 28 U/L (ref 0–44)
AST: 27 U/L (ref 15–41)
Albumin: 4.5 g/dL (ref 3.5–5.0)
Alkaline Phosphatase: 60 U/L (ref 38–126)
Anion gap: 9 (ref 5–15)
BUN: 16 mg/dL (ref 6–20)
CO2: 24 mmol/L (ref 22–32)
Calcium: 9.5 mg/dL (ref 8.9–10.3)
Chloride: 107 mmol/L (ref 98–111)
Creatinine, Ser: 0.7 mg/dL (ref 0.44–1.00)
GFR calc Af Amer: >60 mL/min (ref >60)
GFR calc non Af Amer: >60 mL/min (ref >60)
Glucose, Bld: 97 mg/dL (ref 70–99)
Potassium: 4.1 mmol/L (ref 3.5–5.1)
Sodium: 140 mmol/L (ref 135–145)
Total Bilirubin: 0.7 mg/dL (ref 0.3–1.2)
Total Protein: 8.2 g/dL — ABNORMAL HIGH (ref 6.5–8.1)

## 2020-02-01 LAB — CBC
HCT: 41 % (ref 36.0–46.0)
Hemoglobin: 13.4 g/dL (ref 12.0–15.0)
MCH: 30.2 pg (ref 26.0–34.0)
MCHC: 32.7 g/dL (ref 30.0–36.0)
MCV: 92.3 fL (ref 80.0–100.0)
Platelets: 305 10*3/uL (ref 150–400)
RBC: 4.44 MIL/uL (ref 3.87–5.11)
RDW: 12.9 % (ref 11.5–15.5)
WBC: 9.3 10*3/uL (ref 4.0–10.5)
nRBC: 0 % (ref 0.0–0.2)

## 2020-02-01 LAB — URINALYSIS, ROUTINE W REFLEX MICROSCOPIC
Bilirubin Urine: NEGATIVE
Glucose, UA: NEGATIVE mg/dL
Ketones, ur: 20 mg/dL — AB
Nitrite: NEGATIVE
Protein, ur: 30 mg/dL — AB
RBC / HPF: >50 RBC/hpf — ABNORMAL HIGH (ref 0–5)
Specific Gravity, Urine: 1.033 — ABNORMAL HIGH (ref 1.005–1.030)
pH: 5 (ref 5.0–8.0)

## 2020-02-01 LAB — RAPID URINE DRUG SCREEN, HOSP PERFORMED
Amphetamines: NOT DETECTED
Barbiturates: NOT DETECTED
Benzodiazepines: NOT DETECTED
Cocaine: POSITIVE — AB
Opiates: NOT DETECTED
Tetrahydrocannabinol: POSITIVE — AB

## 2020-02-01 LAB — I-STAT BETA HCG BLOOD, ED (MC, WL, AP ONLY)
I-stat hCG, quantitative: <5 m[IU]/mL (ref <5)
I-stat hCG, quantitative: <5 m[IU]/mL (ref <5)

## 2020-02-01 LAB — LIPASE, BLOOD: Lipase: 26 U/L (ref 11–51)

## 2020-02-01 MED ORDER — PROMETHAZINE HCL 25 MG RE SUPP
25.0000 mg | Freq: Four times a day (QID) | RECTAL | 0 refills | Status: DC | PRN
Start: 2020-02-01 — End: 2020-02-29

## 2020-02-01 MED ORDER — SODIUM CHLORIDE 0.9% FLUSH: 3.0000 mL | Freq: Once | INTRAVENOUS | Status: DC

## 2020-02-01 MED ORDER — SODIUM CHLORIDE 0.9 % IV BOLUS
1000.0000 mL | Freq: Once | INTRAVENOUS | Status: AC
  Administered 2020-02-01: 1000 mL via INTRAVENOUS

## 2020-02-01 MED ORDER — SODIUM CHLORIDE 0.9% FLUSH
3.0000 mL | Freq: Once | INTRAVENOUS | Status: AC
  Administered 2020-02-01: 3 mL via INTRAVENOUS

## 2020-02-01 MED ORDER — METOCLOPRAMIDE HCL 5 MG/ML IJ SOLN
10.0000 mg | Freq: Once | INTRAMUSCULAR | Status: AC
  Administered 2020-02-01: 10 mg via INTRAVENOUS
  Filled 2020-02-01: qty 2

## 2020-02-01 MED ORDER — IOHEXOL 300 MG/ML  SOLN
100.0000 mL | Freq: Once | INTRAMUSCULAR | Status: AC | PRN
  Administered 2020-02-01: 100 mL via INTRAVENOUS

## 2020-02-01 MED ORDER — AZITHROMYCIN 250 MG PO TABS: 1000.0000 mg | ORAL_TABLET | Freq: Once | ORAL | 0 refills | Status: AC

## 2020-02-01 NOTE — Telephone Encounter (Signed)
Patient's concerns addressed in mychart encounter.

## 2020-02-01 NOTE — ED Notes (Signed)
Pt had blood drawn at 5a this morning but left before being seen.

## 2020-02-01 NOTE — ED Triage Notes (Signed)
Arrived POV from home. Patient reports abdominal pain, nausea, and vomiting since last week. Patient was seen at this facility for same on 01/24/2020. Patient reports she was treated for UTI but symptoms continue.

## 2020-02-01 NOTE — Telephone Encounter (Signed)
Patient called into the office wanting to speak to a nurse because she threw up her medicine that starts with an "A" should be in the system per patient and wanted to know she if should take another one. Patient instructed that a nurse will call her back as soon as they can. Patient verbalized understanding and message sent to clinical pool.

## 2020-02-01 NOTE — ED Notes (Signed)
Patient has a urine culture in the main lab 

## 2020-02-01 NOTE — ED Provider Notes (Signed)
[facility]-EMERGENCY DEPT Provider Note   CSN: [id number] Arrival date & time: 02/01/20  1233     History Chief Complaint  Patient presents with  . Nausea  . Emesis    Leah [name] is a 24 y.o. female with PMHx anxiety, bipolar disorder, GERD, s/p cholecystectomy who presents to the ED today with complaint of persistent sharp periumbilical abdominal pain x 1 week. Pt also complains of nausea and NBNB emesis. She reports 1 episode of diarrhea yesterday however normal bowel movements the remainder of the week. Pt states she was seen in the ED approximately 1 week ago and diagnosed with a UTI - she reports she has been taking the medication however her symptoms still persist. She was also prescribed phenergan and Norco; she states she is still vomiting with the phenergan however the Norco has helped her pain. Pt denies any urinary sx - states she did not have any last week when she was told she had a UTI as well. Pt denies any suspicious food intake or recent sick contacts. She denies similar symptoms in the past. She denies marijuana use. No fevers, chills, pelvic pain, vaginal discharge, urinary sx, or any other associated symptoms.   Per chart review pt has been seen in the past for epigastric pain with N/V on 06/24 with negative workup. Discharged home with protonix and carafate.   The history is provided by the patient and medical records.       Past Medical History:  Diagnosis Date  . Anemia 2013  . Anxiety 2011   no meds during pregnancy  . Bipolar 1 disorder (HCC)   . Chlamydia   . Gall stones   . GERD (gastroesophageal reflux disease)   . 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 Seizure [facility]) 2011   pseudoseizures    Patient Active Problem List   Diagnosis Date Noted  . STD exposure 12/21/2019  . Implanon removal 12/21/2019  . Severe recurrent major depression without psychotic features (HCC) 12/31/2018  . S/P laparoscopic  cholecystectomy 10/22/2017  . Amenorrhea, secondary 09/12/2017  . 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 Suicide attempt by drug ingestion (HCC)   . Contraception management 03/10/2014  . Biliary colic 03/10/2014  . Depression 03/16/2013  . Allergic rhinitis, seasonal 01/26/2013  . Anemia, iron deficiency 12/01/2012    Past Surgical History:  Procedure Laterality Date  . CHOLECYSTECTOMY N/A 09/28/2014   Procedure: LAPAROSCOPIC CHOLECYSTECTOMY;  Surgeon: Axel Filler, MD;  Location: [facility] OR;  Service: General;  Laterality: N/A;  . CHOLECYSTECTOMY    . DIRECT LARYNGOSCOPY N/A 04/24/2014   Procedure: DIRECT LARYNGOSCOPY;  Surgeon: [name], MD;  Location: [facility] OR;  Service: ENT;  Laterality: N/A;  . FOREIGN BODY REMOVAL ESOPHAGEAL N/A 04/24/2014   Procedure: REMOVAL FOREIGN BODY ESOPHAGEAL;  Surgeon: [name], MD;  Location: [facility] OR;  Service: ENT;  Laterality: N/A;  . WISDOM TOOTH EXTRACTION  2013     OB History    Gravida  2   Para  1   Term  1   Preterm  0   AB  1   Living  1     SAB  1   TAB  0   Ectopic  0   Multiple      Live Births  1           Family History  Problem Relation Age of Onset  .  Arthritis Mother   . Bronchitis Mother   . Asthma Mother   . Hearing loss Paternal Grandfather   . Diabetes Neg Hx   . Stomach cancer Neg Hx   . Colon cancer Neg Hx     Social History   Tobacco Use  . Smoking status: Never Smoker  . Smokeless tobacco: Never Used  . Tobacco comment: marjiuna  Vaping Use  . Vaping Use: Never used  Substance Use Topics  . Alcohol use: No    Alcohol/week: 6.0 standard drinks    Types: 6 Cans of beer per week  . Drug use: Yes    Types: Marijuana    Comment: occ.    Home Medications Prior to Admission medications   Medication Sig Start Date End Date Taking? Authorizing Provider  azithromycin (ZITHROMAX) 250 MG tablet Take 4 tablets (1,000 mg total) by mouth once for  1 dose. 02/01/20 02/01/20  Reva Bores, MD  cephALEXin (KEFLEX) 500 MG capsule Take 1 capsule (500 mg total) by mouth 3 (three) times daily. 01/24/20   Raeford Razor, MD  fluconazole (DIFLUCAN) 150 MG tablet Take 1 tablet (150 mg total) by mouth daily. Repeat in 24 hours if needed 01/31/20   Reva Bores, MD  HYDROcodone-acetaminophen (NORCO/VICODIN) 5-325 MG tablet Take 1 tablet by mouth every 6 (six) hours as needed. 01/24/20   Raeford Razor, MD  hydrocortisone 2.5 % lotion Apply topically 2 (two) times daily. Patient not taking: Reported on 12/21/2019 12/17/19   Couture, Cortni S, PA-C  norelgestromin-ethinyl estradiol (ORTHO EVRA) 150-35 MCG/24HR transdermal patch Place 1 patch onto the skin once a week. 12/21/19   Hermina Staggers, MD  OLANZapine (ZYPREXA) 10 MG tablet Take 5 mg by mouth at bedtime.     [provider]  ondansetron (ZOFRAN ODT) 4 MG disintegrating tablet Take 1 tablet (4 mg total) by mouth every 8 (eight) hours as needed for nausea or vomiting. 01/13/20   Petrucelli, Samantha R, PA-C  pantoprazole (PROTONIX) 40 MG tablet Take 1 tablet (40 mg total) by mouth daily. 01/13/20   Petrucelli, Pleas Koch, PA-C  promethazine (PHENERGAN) 25 MG suppository Place 1 suppository (25 mg total) rectally every 6 (six) hours as needed for nausea or vomiting. 02/01/20   Hyman Hopes, Dayelin Balducci, PA-C  simethicone (GAS-X) 80 MG chewable tablet Chew 1 tablet (80 mg total) by mouth every 6 (six) hours as needed for flatulence. Patient not taking: Reported on 01/24/2020 01/13/20   Petrucelli, Lelon Mast R, PA-C  sucralfate (CARAFATE) 1 GM/10ML suspension Take 10 mLs (1 g total) by mouth 4 (four) times daily -  with meals and at bedtime. Patient not taking: Reported on 01/24/2020 01/13/20   Petrucelli, Pleas Koch, PA-C    Allergies    Bactrim [sulfamethoxazole-trimethoprim]  Review of Systems   Review of Systems  Constitutional: Negative for chills and fever.  Gastrointestinal: Positive for abdominal pain,  diarrhea (resolved), nausea and vomiting. Negative for blood in stool.  Genitourinary: Negative for dysuria, flank pain, frequency, pelvic pain and vaginal discharge.  All other systems reviewed and are negative.   Physical Exam Updated Vital Signs BP 114/76 (BP Location: Right Arm)   Pulse 73   Temp 98.3 F (36.8 C) (Oral)   Resp 17   Ht 5\' 1"  (1.549 m)   Wt 65.8 kg   SpO2 100%   BMI 27.40 kg/m   Physical Exam Vitals and nursing note reviewed.  Constitutional:      Appearance: She is not ill-appearing or diaphoretic.  HENT:     Head: Normocephalic and atraumatic.     Mouth/Throat:     Mouth: Mucous membranes are dry.  Eyes:     Conjunctiva/sclera: Conjunctivae normal.  Cardiovascular:     Rate and Rhythm: Normal rate and regular rhythm.     Pulses: Normal pulses.  Pulmonary:     Effort: Pulmonary effort is normal.     Breath sounds: Normal breath sounds. No wheezing, rhonchi or rales.  Abdominal:     Palpations: Abdomen is soft.     Tenderness: There is abdominal tenderness. There is no right CVA tenderness, left CVA tenderness, guarding or rebound.     Comments: Soft, + periumbilical and suprapubic TTP, +BS throughout, no r/g/r, neg murphy's, neg mcburney's, no CVA TTP  Musculoskeletal:     Cervical back: Neck supple.  Skin:    General: Skin is warm and dry.  Neurological:     Mental Status: She is alert.     ED Results / Procedures / Treatments   Labs (all labs ordered are listed, but only abnormal results are displayed) Labs Reviewed  URINALYSIS, ROUTINE W REFLEX MICROSCOPIC - Abnormal; Notable for the following components:      Result Value   Specific Gravity, Urine 1.033 (*)    Hgb urine dipstick LARGE (*)    Ketones, ur 20 (*)    Protein, ur 30 (*)    Leukocytes,Ua SMALL (*)    RBC / HPF >50 (*)    Bacteria, UA RARE (*)    All other components within normal limits  RAPID URINE DRUG SCREEN, HOSP PERFORMED - Abnormal; Notable for the following  components:   Cocaine POSITIVE (*)    Tetrahydrocannabinol POSITIVE (*)    All other components within normal limits  I-STAT BETA HCG BLOOD, ED ([facility], [facility], [facility] ONLY)    EKG None  Radiology CT Abdomen Pelvis W Contrast  Result Date: 02/01/2020 CLINICAL DATA:  Abdominal pain, nausea and vomiting. EXAM: CT ABDOMEN AND PELVIS WITH CONTRAST TECHNIQUE: Multidetector CT imaging of the abdomen and pelvis was performed using the standard protocol following bolus administration of intravenous contrast. CONTRAST:  100mL OMNIPAQUE IOHEXOL 300 MG/ML  SOLN COMPARISON:  10/20/2017 FINDINGS: Lower chest: No acute abnormality. Hepatobiliary: No focal liver abnormality is seen. Status post cholecystectomy. No biliary dilatation. Pancreas: Unremarkable. No pancreatic ductal dilatation or surrounding inflammatory changes. Spleen: Normal in size without focal abnormality. Adrenals/Urinary Tract: Normal appearance of the adrenal glands. The kidneys are unremarkable. No mass or hydronephrosis identified. Urinary bladder appears within normal limits. Stomach/Bowel: Stomach appears normal. The appendix is visualized and is within normal limits measuring 4-5 mm in diameter. No periappendiceal soft tissue stranding. No bowel wall thickening, inflammation or distension. Unremarkable appearance of the colon. Vascular/Lymphatic: Normal appearance of the abdominal aorta. No abdominal or pelvic adenopathy. Reproductive: Uterus and bilateral adnexa are unremarkable. Other: No free fluid or fluid collection Musculoskeletal: No acute or significant osseous findings. IMPRESSION: 1. No acute findings within the abdomen or pelvis. 2. Status post cholecystectomy. Electronically Signed   By: Signa Kellaylor  Stroud M.D.   On: 02/01/2020 16:34    Procedures Procedures (including critical care time)  Medications Ordered in ED Medications  sodium chloride flush (NS) 0.9 % injection 3 mL (3 mLs Intravenous Given 02/01/20 1538)  sodium chloride 0.9 %  bolus 1,000 mL (0 mLs Intravenous Stopped 02/01/20 1741)  metoCLOPramide (REGLAN) injection 10 mg (10 mg Intravenous Given 02/01/20 1532)  iohexol (OMNIPAQUE) 300 MG/ML solution 100 mL (100 mLs  Intravenous Contrast Given 02/01/20 1608)    ED Course  I have reviewed the triage vital signs and the nursing notes.  Pertinent labs & imaging results that were available during my care of the patient were reviewed by me and considered in my medical decision making (see chart for details).  Clinical Course as of Jan 31 1922  Tue Feb 01, 2020  1448 Specific Gravity, Urine(!): 1.033 [MV]  1448 Currently on menses  RBC / HPF(!): >50 [MV]  1758 Tetrahydrocannabinol(!): POSITIVE [MV]    Clinical Course User Index [MV] Tanda Rockers, PA-C   MDM Rules/Calculators/A&P                          24 year old female who presents to the ED today with continued abdominal pain, nausea, vomiting for the past week.  Seen in the ED last week and diagnosed with UTI however denies any urinary symptoms.  Discharged with Keflex.  Per urine culture organism resistant, pharmacist tried to contact patient to switch to Cipro however 3 unsuccessful attempts however she is not having any urinary sx and do not feel she needs additional abx at this time. She reports she has still been vomiting with Phenergan.  Went to the ED early this morning however left prior to being seen, lab work noted as below.  Returned for further evaluation today.  On arrival to the ED patient is afebrile, nontachycardic and nontachypneic.  She appears to be in no acute distress.  Urinalysis was obtained while patient was in the waiting room with small leuks, improved from previous.  Hemoglobin noted at this time.  Patient states she is currently on her menses.  She is noted to have periumbilical and suprapubic abdominal tenderness to palpation.  She denies any pelvic pain or vaginal discharge.  Given this is a return visit from last week without any  improvement in her symptoms we will plan for CT scan at this time.  Patient denies any marijuana use however will obtain UDS at this time.  Fluids and Reglan provided.  WBC 4.0 - 10.5 K/uL 9.3   RBC 3.87 - 5.11 MIL/uL 4.44   Hemoglobin 12.0 - 15.0 g/dL 75.6   HCT 36 - 46 % 43.3   MCV 80.0 - 100.0 fL 92.3   MCH 26.0 - 34.0 pg 30.2   MCHC 30.0 - 36.0 g/dL 29.5   RDW 18.8 - 41.6 % 12.9   Platelets 150 - 400 K/uL 305   nRBC 0.0 - 0.2 % 0.0    Sodium 135 - 145 mmol/L 140   Potassium 3.5 - 5.1 mmol/L 4.1   Chloride 98 - 111 mmol/L 107   CO2 22 - 32 mmol/L 24   Glucose, Bld 70 - 99 mg/dL 97   Comment: Glucose reference range applies only to samples taken after fasting for at least 8 hours.  BUN 6 - 20 mg/dL 16   Creatinine, Ser 6.06 - 1.00 mg/dL 3.01   Calcium 8.9 - 60.1 mg/dL 9.5   Total Protein 6.5 - 8.1 g/dL 0.9NATF   Albumin 3.5 - 5.0 g/dL 4.5   AST 15 - 41 U/L 27   ALT 0 - 44 U/L 28   Alkaline Phosphatase 38 - 126 U/L 60   Total Bilirubin 0.3 - 1.2 mg/dL 0.7   GFR calc non Af Amer >60 mL/min >60   GFR calc Af Amer >60 mL/min >60   Anion gap 5 - 15 9  Lipase 11 - 51 U/L 26    I-stat hCG, quantitative <5 mIU/mL <5.0    CT scan without acute findings.  UDS has returned positive for marijuana and cocaine. I suspect this may be exacerbating pt's symptoms today.   On reevaluation pt resting comfortably and asleep. Will fluid challenge prior to discharge.   Pt able to tolerate fluids without difficulty. Will discharge home at this time with suppository phenergan PRN. Pt instructed to discontinue marijuana. Pt to follow up with PCP. Strict return precautions have been discussed. Pt is in agreement with plan and stable for discharge home.   This note was prepared using Dragon voice recognition software and may include unintentional dictation errors due to the inherent limitations of voice recognition software.   Final Clinical Impression(s) / ED Diagnoses Final diagnoses:    Generalized abdominal pain  Non-intractable vomiting with nausea, unspecified vomiting type    Rx / DC Orders ED Discharge Orders         Ordered    promethazine (PHENERGAN) 25 MG suppository  Every 6 hours PRN     Discontinue  Reprint     02/01/20 1922           Discharge Instructions     Your labwork from earlier this morning and your CT scan did not show any acute findings. I have prescribed suppository phenergan for you to use given you have continued vomiting with oral phenergan. Pick up and use as needed.   Follow up with your PCP for same. Considering discontinuing marijuana as this can cause persistent nausea and vomiting.   Return to the ED for any worsening symptoms       Tanda Rockers, Cordelia Poche 02/01/20 1925    Derwood Kaplan, MD 02/06/20 1250

## 2020-02-01 NOTE — ED Triage Notes (Signed)
Pt reports vomiting and nausea for approx a week, recent UTI treated on 01/24/20. Pt has been taking prescribed meds without relief.

## 2020-02-01 NOTE — Discharge Instructions (Addendum)
Your labwork from earlier this morning and your CT scan did not show any acute findings. I have prescribed suppository phenergan for you to use given you have continued vomiting with oral phenergan. Pick up and use as needed.   Follow up with your PCP for same. Considering discontinuing marijuana as this can cause persistent nausea and vomiting.   Return to the ED for any worsening symptoms

## 2020-02-14 ENCOUNTER — Other Ambulatory Visit (HOSPITAL_COMMUNITY)
Admission: RE | Admit: 2020-02-14 | Discharge: 2020-02-14 | Disposition: A | Discharge status: Home / Self Care | Payer: Medicaid Other | Source: Ambulatory Visit | Attending: Family Medicine | Admitting: Family Medicine

## 2020-02-14 ENCOUNTER — Ambulatory Visit (INDEPENDENT_AMBULATORY_CARE_PROVIDER_SITE_OTHER): Payer: Medicaid Other | Admitting: General Practice

## 2020-02-14 ENCOUNTER — Other Ambulatory Visit: Payer: Self-pay

## 2020-02-14 DIAGNOSIS — Z8619 Personal history of other infectious and parasitic diseases: Secondary | ICD-10-CM | POA: Insufficient documentation

## 2020-02-14 NOTE — Progress Notes (Signed)
Patient presents to office today for test of cure- was recently positive for chlamydia. Patient states she & her partner have been treated. Patient was instructed in self swab & specimen was collected. Discussed results should be back in 24-48 hours & we will contact her with any abnormal results. Patient verbalized understanding.  Chase Caller RN BSN 02/14/20

## 2020-02-15 LAB — GC/CHLAMYDIA PROBE AMP (~~LOC~~) NOT AT ARMC
Chlamydia: NEGATIVE
Comment: NEGATIVE
Comment: NORMAL
Neisseria Gonorrhea: NEGATIVE

## 2020-02-15 NOTE — Progress Notes (Signed)
I have reviewed the chart and agree with nursing staff's documentation of this patient's encounter.  [name], CNM 02/15/2020 6:55 AM

## 2020-02-16 DIAGNOSIS — F331 Major depressive disorder, recurrent, moderate: Secondary | ICD-10-CM | POA: Diagnosis not present

## 2020-02-29 ENCOUNTER — Ambulatory Visit (INDEPENDENT_AMBULATORY_CARE_PROVIDER_SITE_OTHER): Payer: Medicaid Other

## 2020-02-29 ENCOUNTER — Other Ambulatory Visit: Payer: Self-pay

## 2020-02-29 DIAGNOSIS — Z3201 Encounter for pregnancy test, result positive: Secondary | ICD-10-CM | POA: Diagnosis not present

## 2020-02-29 DIAGNOSIS — Z3200 Encounter for pregnancy test, result unknown: Secondary | ICD-10-CM

## 2020-02-29 DIAGNOSIS — M722 Plantar fascial fibromatosis: Secondary | ICD-10-CM | POA: Diagnosis not present

## 2020-02-29 DIAGNOSIS — F332 Major depressive disorder, recurrent severe without psychotic features: Secondary | ICD-10-CM

## 2020-02-29 LAB — POCT PREGNANCY, URINE: Preg Test, Ur: POSITIVE — AB

## 2020-02-29 NOTE — Progress Notes (Signed)
Pt left urine sample in office for urine pregnancy test; UPT today is positive. Called pt with result. LMP is 02/01/20 with EDD of 11/07/20; pt is 4w today. Patient would like to receive prenatal care in our office. Explained the front office will reach out to schedule appts. Pt reports mild pelvic pain recently, denies any pain at this time. Denies any bleeding. Ectopic precautions given and pt given instructions to be evaluated at [facility] with any pain or bleeding.   Medications reviewed with pt. Pt has completed Keflex for recent UTI. Reports some urinary frequency, denies other symptoms. Reports discontinuing Zyprexa due to positive pregnancy test. Pt has hx of depression. Reports being followed be a therapist, but does not have someone managing medications. Referral placed to [facility] at [facility] and pt given phone number to call for appt.  Fleet Contras RN 02/29/20

## 2020-03-01 ENCOUNTER — Emergency Department (HOSPITAL_COMMUNITY)
Admission: EM | Admit: 2020-03-01 | Discharge: 2020-03-01 | Disposition: A | Discharge status: Home / Self Care | Payer: Medicaid Other | Attending: Emergency Medicine | Admitting: Emergency Medicine

## 2020-03-01 ENCOUNTER — Encounter (HOSPITAL_COMMUNITY): Payer: Self-pay | Admitting: Emergency Medicine

## 2020-03-01 DIAGNOSIS — R102 Pelvic and perineal pain: Secondary | ICD-10-CM | POA: Insufficient documentation

## 2020-03-01 DIAGNOSIS — Z5321 Procedure and treatment not carried out due to patient leaving prior to being seen by health care provider: Secondary | ICD-10-CM | POA: Insufficient documentation

## 2020-03-01 DIAGNOSIS — Z79899 Other long term (current) drug therapy: Secondary | ICD-10-CM | POA: Diagnosis not present

## 2020-03-01 DIAGNOSIS — O26891 Other specified pregnancy related conditions, first trimester: Secondary | ICD-10-CM | POA: Diagnosis not present

## 2020-03-01 DIAGNOSIS — N83201 Unspecified ovarian cyst, right side: Secondary | ICD-10-CM | POA: Diagnosis not present

## 2020-03-01 DIAGNOSIS — O23591 Infection of other part of genital tract in pregnancy, first trimester: Secondary | ICD-10-CM | POA: Diagnosis not present

## 2020-03-01 DIAGNOSIS — B373 Candidiasis of vulva and vagina: Secondary | ICD-10-CM | POA: Diagnosis not present

## 2020-03-01 DIAGNOSIS — O26899 Other specified pregnancy related conditions, unspecified trimester: Secondary | ICD-10-CM | POA: Diagnosis not present

## 2020-03-01 DIAGNOSIS — N83291 Other ovarian cyst, right side: Secondary | ICD-10-CM | POA: Diagnosis not present

## 2020-03-01 DIAGNOSIS — F319 Bipolar disorder, unspecified: Secondary | ICD-10-CM | POA: Diagnosis not present

## 2020-03-01 DIAGNOSIS — O98811 Other maternal infectious and parasitic diseases complicating pregnancy, first trimester: Secondary | ICD-10-CM | POA: Diagnosis not present

## 2020-03-01 DIAGNOSIS — Z3A01 Less than 8 weeks gestation of pregnancy: Secondary | ICD-10-CM | POA: Diagnosis not present

## 2020-03-01 DIAGNOSIS — O99341 Other mental disorders complicating pregnancy, first trimester: Secondary | ICD-10-CM | POA: Diagnosis not present

## 2020-03-01 DIAGNOSIS — O3481 Maternal care for other abnormalities of pelvic organs, first trimester: Secondary | ICD-10-CM | POA: Diagnosis not present

## 2020-03-01 NOTE — ED Notes (Signed)
States that she is [redacted] weeks pregnant. Having pelvic pain since yesterday. Denies any bleeding or other symptoms.

## 2020-03-10 DIAGNOSIS — F331 Major depressive disorder, recurrent, moderate: Secondary | ICD-10-CM | POA: Diagnosis not present

## 2020-03-14 ENCOUNTER — Inpatient Hospital Stay (HOSPITAL_COMMUNITY): Payer: Medicaid Other

## 2020-03-14 ENCOUNTER — Inpatient Hospital Stay (HOSPITAL_COMMUNITY)
Admission: AD | Admit: 2020-03-14 | Discharge: 2020-03-14 | Disposition: A | Discharge status: Home / Self Care | Payer: Medicaid Other | Attending: Obstetrics and Gynecology | Admitting: Obstetrics and Gynecology

## 2020-03-14 ENCOUNTER — Encounter (HOSPITAL_COMMUNITY): Payer: Self-pay | Admitting: Obstetrics and Gynecology

## 2020-03-14 ENCOUNTER — Other Ambulatory Visit: Payer: Self-pay

## 2020-03-14 DIAGNOSIS — F1290 Cannabis use, unspecified, uncomplicated: Secondary | ICD-10-CM | POA: Insufficient documentation

## 2020-03-14 DIAGNOSIS — Z3A01 Less than 8 weeks gestation of pregnancy: Secondary | ICD-10-CM | POA: Diagnosis not present

## 2020-03-14 DIAGNOSIS — O99321 Drug use complicating pregnancy, first trimester: Secondary | ICD-10-CM | POA: Diagnosis not present

## 2020-03-14 DIAGNOSIS — O99341 Other mental disorders complicating pregnancy, first trimester: Secondary | ICD-10-CM | POA: Insufficient documentation

## 2020-03-14 DIAGNOSIS — Z79899 Other long term (current) drug therapy: Secondary | ICD-10-CM | POA: Insufficient documentation

## 2020-03-14 DIAGNOSIS — R109 Unspecified abdominal pain: Secondary | ICD-10-CM | POA: Diagnosis not present

## 2020-03-14 DIAGNOSIS — O26891 Other specified pregnancy related conditions, first trimester: Secondary | ICD-10-CM | POA: Diagnosis not present

## 2020-03-14 DIAGNOSIS — F319 Bipolar disorder, unspecified: Secondary | ICD-10-CM | POA: Insufficient documentation

## 2020-03-14 DIAGNOSIS — Z3490 Encounter for supervision of normal pregnancy, unspecified, unspecified trimester: Secondary | ICD-10-CM

## 2020-03-14 DIAGNOSIS — R1031 Right lower quadrant pain: Secondary | ICD-10-CM | POA: Diagnosis not present

## 2020-03-14 DIAGNOSIS — O99611 Diseases of the digestive system complicating pregnancy, first trimester: Secondary | ICD-10-CM | POA: Insufficient documentation

## 2020-03-14 DIAGNOSIS — K219 Gastro-esophageal reflux disease without esophagitis: Secondary | ICD-10-CM | POA: Diagnosis not present

## 2020-03-14 DIAGNOSIS — Z9049 Acquired absence of other specified parts of digestive tract: Secondary | ICD-10-CM | POA: Insufficient documentation

## 2020-03-14 DIAGNOSIS — O219 Vomiting of pregnancy, unspecified: Secondary | ICD-10-CM | POA: Diagnosis not present

## 2020-03-14 DIAGNOSIS — O210 Mild hyperemesis gravidarum: Secondary | ICD-10-CM

## 2020-03-14 LAB — CBC
HCT: 36.2 % (ref 36.0–46.0)
Hemoglobin: 12.2 g/dL (ref 12.0–15.0)
MCH: 30.2 pg (ref 26.0–34.0)
MCHC: 33.7 g/dL (ref 30.0–36.0)
MCV: 89.6 fL (ref 80.0–100.0)
Platelets: 284 10*3/uL (ref 150–400)
RBC: 4.04 MIL/uL (ref 3.87–5.11)
RDW: 12.9 % (ref 11.5–15.5)
WBC: 7.8 10*3/uL (ref 4.0–10.5)
nRBC: 0 % (ref 0.0–0.2)

## 2020-03-14 LAB — GC/CHLAMYDIA PROBE AMP (~~LOC~~) NOT AT ARMC
Chlamydia: NEGATIVE
Comment: NEGATIVE
Comment: NORMAL
Neisseria Gonorrhea: NEGATIVE

## 2020-03-14 LAB — WET PREP, GENITAL
Clue Cells Wet Prep HPF POC: NONE SEEN
Sperm: NONE SEEN
Trich, Wet Prep: NONE SEEN
Yeast Wet Prep HPF POC: NONE SEEN

## 2020-03-14 LAB — URINALYSIS, ROUTINE W REFLEX MICROSCOPIC
Bilirubin Urine: NEGATIVE
Glucose, UA: NEGATIVE mg/dL
Hgb urine dipstick: NEGATIVE
Ketones, ur: 5 mg/dL — AB
Leukocytes,Ua: NEGATIVE
Nitrite: NEGATIVE
Protein, ur: NEGATIVE mg/dL
Specific Gravity, Urine: 1.026 (ref 1.005–1.030)
pH: 5 (ref 5.0–8.0)

## 2020-03-14 LAB — HCG, QUANTITATIVE, PREGNANCY: hCG, Beta Chain, Quant, S: 23651 m[IU]/mL — ABNORMAL HIGH (ref <5)

## 2020-03-14 MED ORDER — METOCLOPRAMIDE HCL 10 MG PO TABS
10.0000 mg | ORAL_TABLET | Freq: Once | ORAL | Status: AC
  Administered 2020-03-14: 10 mg via ORAL
  Filled 2020-03-14: qty 1

## 2020-03-14 MED ORDER — METOCLOPRAMIDE HCL 10 MG PO TABS: 10.0000 mg | ORAL_TABLET | Freq: Four times a day (QID) | ORAL | 0 refills | Status: DC

## 2020-03-14 NOTE — MAU Note (Addendum)
...  Leah [name] is a 24 y.o. at Unknown here in [facility]U reporting: right sided pelvic pain that began on 02/29/20. Pt reports the pain has increasingly gotten better since then but "I just want to get it checked out." Pt reports she went to [facility] two weeks ago and they told her that she had an ovarian cyst but that it should not be causing her pain. Pt reports she is not having any pain now and the last time she experienced the pain was yesterday. No VB with reports of white non-odorous discharge.  Last intercourse: 02/23/20 LMP: 02/01/20

## 2020-03-14 NOTE — MAU Note (Signed)
Pt reports new onset nausea. Emesis bag given.

## 2020-03-14 NOTE — MAU Provider Note (Signed)
History     CSN: [id number]  Arrival date and time: 03/14/20 4098   First Provider Initiated Contact with Patient 03/14/20 0435      Chief Complaint  Patient presents with  . Pelvic Pain   [name] is a 24 y.o. year old G1P1001 female at [redacted]w[redacted]d weeks gestation who presents to MAU reporting RT side pelvic pain that began February 29, 2020.  She reports the pain increased only got better since then but she wanted to get checked out.  She reports she went to [facility] 2 weeks ago and was told she had an ovarian cyst but that it should not be causing her the pain she was having.  She denies any pain at this time.  She denies vaginal bleeding.  She reports vaginal discharge but no odor.  Last sexual intercourse was February 23, 2020.  Last menstrual period February 01, 2020.   OB History    Gravida  2   Para  1   Term  1   Preterm  0   AB  0   Living  1     SAB  0   TAB  0   Ectopic  0   Multiple      Live Births  1           Past Medical History:  Diagnosis Date  . Anemia 2013  . Anxiety 2011   no meds during pregnancy  . Bipolar 1 disorder (HCC)   . Chlamydia   . Gall stones   . GERD (gastroesophageal reflux disease)   . 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 Seizure [facility]) 2011   pseudoseizures    Past Surgical History:  Procedure Laterality Date  . CHOLECYSTECTOMY N/A 09/28/2014   Procedure: LAPAROSCOPIC CHOLECYSTECTOMY;  Surgeon: Axel Filler, MD;  Location: [facility] OR;  Service: General;  Laterality: N/A;  . CHOLECYSTECTOMY    . DIRECT LARYNGOSCOPY N/A 04/24/2014   Procedure: DIRECT LARYNGOSCOPY;  Surgeon: [name], MD;  Location: [facility] OR;  Service: ENT;  Laterality: N/A;  . FOREIGN BODY REMOVAL ESOPHAGEAL N/A 04/24/2014   Procedure: REMOVAL FOREIGN BODY ESOPHAGEAL;  Surgeon: [name], MD;  Location: [facility] OR;  Service: ENT;  Laterality: N/A;  . WISDOM TOOTH EXTRACTION  2013    Family History  Problem Relation Age  of Onset  . Arthritis Mother   . Bronchitis Mother   . Asthma Mother   . Hearing loss Paternal Grandfather   . Diabetes Neg Hx   . Stomach cancer Neg Hx   . Colon cancer Neg Hx     Social History   Tobacco Use  . Smoking status: Never Smoker  . Smokeless tobacco: Never Used  . Tobacco comment: marjiuna  Vaping Use  . Vaping Use: Never used  Substance Use Topics  . Alcohol use: No    Alcohol/week: 6.0 standard drinks    Types: 6 Cans of beer per week  . Drug use: Yes    Types: Marijuana    Comment: occ.    Allergies:  Allergies  Allergen Reactions  . Bactrim [Sulfamethoxazole-Trimethoprim] Rash    Medications Prior to Admission  Medication Sig Dispense Refill Last Dose  . Prenatal Vit-Fe Fumarate-FA (PRENATAL MULTIVITAMIN) TABS tablet Take 1 tablet by mouth daily at 12 noon.   03/13/2020 at Unknown time  . OLANZapine (ZYPREXA) 10 MG tablet Take 5 mg by mouth at bedtime.  (Patient  not taking: Reported on 02/29/2020)       Review of Systems  Constitutional: Negative.   HENT: Negative.   Eyes: Negative.   Respiratory: Negative.   Cardiovascular: Negative.   Gastrointestinal: Negative.   Endocrine: Negative.   Genitourinary: Negative for pelvic pain (RT side since 8/10, but none now).  Musculoskeletal: Negative.   Skin: Negative.   Allergic/Immunologic: Negative.   Neurological: Negative.   Hematological: Negative.   Psychiatric/Behavioral: Negative.    Physical Exam   Blood pressure 109/63, pulse 84, temperature 98.2 F (36.8 C), temperature source Oral, resp. rate 15, height 5' (1.524 m), weight 64 kg, last menstrual period 02/01/2020, SpO2 100 %.  Physical Exam Constitutional:      Appearance: Normal appearance. She is normal weight.  HENT:     Head: Normocephalic and atraumatic.  Cardiovascular:     Rate and Rhythm: Normal rate.  Pulmonary:     Effort: Pulmonary effort is normal.  Genitourinary:    Comments: Uterus: non-tender, SE: cervix is smooth,  pink, no lesions, small amt of white vaginal d/c -- WP, GC/CT done, closed/long/firm, no CMT or friability, LT adnexal tenderness  Musculoskeletal:        General: Normal range of motion.  Skin:    General: Skin is warm and dry.  Neurological:     Mental Status: She is alert and oriented to person, place, and time.  Psychiatric:        Mood and Affect: Mood normal.        Behavior: Behavior normal.        Thought Content: Thought content normal.        Judgment: Judgment normal.     MAU Course  Procedures  MDM CCUA UPT CBC ABO/Rh HCG Wet Prep GC/CT -- pending HIV -- pending OB < 14 wks [place] with TV  Results for orders placed or performed during the hospital encounter of 03/14/20 (from the past 24 hour(s))  Wet prep, genital     Status: Abnormal   Collection Time: 03/14/20  4:05 AM   Specimen: Urine, Clean Catch  Result Value Ref Range   Yeast Wet Prep HPF POC NONE SEEN NONE SEEN   Trich, Wet Prep NONE SEEN NONE SEEN   Clue Cells Wet Prep HPF POC NONE SEEN NONE SEEN   WBC, Wet Prep HPF POC MANY (A) NONE SEEN   Sperm NONE SEEN   CBC     Status: None   Collection Time: 03/14/20  4:18 AM  Result Value Ref Range   WBC 7.8 4.0 - 10.5 K/uL   RBC 4.04 3.87 - 5.11 MIL/uL   Hemoglobin 12.2 12.0 - 15.0 g/dL   HCT 63.8 36 - 46 %   MCV 89.6 80.0 - 100.0 fL   MCH 30.2 26.0 - 34.0 pg   MCHC 33.7 30.0 - 36.0 g/dL   RDW 45.3 64.6 - 80.3 %   Platelets 284 150 - 400 K/uL   nRBC 0.0 0.0 - 0.2 %  hCG, quantitative, pregnancy     Status: Abnormal   Collection Time: 03/14/20  4:18 AM  Result Value Ref Range   hCG, Beta Chain, Quant, S 23,651 (H) <5 mIU/mL  Urinalysis, Routine w reflex microscopic Urine, Clean Catch     Status: Abnormal   Collection Time: 03/14/20  4:47 AM  Result Value Ref Range   Color, Urine YELLOW YELLOW   APPearance HAZY (A) CLEAR   Specific Gravity, Urine 1.026 1.005 - 1.030   pH 5.0  5.0 - 8.0   Glucose, UA NEGATIVE NEGATIVE mg/dL   Hgb urine dipstick  NEGATIVE NEGATIVE   Bilirubin Urine NEGATIVE NEGATIVE   Ketones, ur 5 (A) NEGATIVE mg/dL   Protein, ur NEGATIVE NEGATIVE mg/dL   Nitrite NEGATIVE NEGATIVE   Leukocytes,Ua NEGATIVE NEGATIVE    US OB LESS THAN 14 WEEKS WITH OB TRANSVAGINAL  Result Date: 03/14/2020 CLINICAL DATA:  Initial evaluation for acute right lower quadrant pain, early pregnancy. EXAM: OBSTETRIC <14 WK [place] AND TRANSVAGINAL OB US TECHNIQUE: Both transabdominal and transvaginal ultrasound examinations were performed for complete evaluation of the gestation as well as the maternal uterus, adnexal regions, and pelvic cul-de-sac. Transvaginal technique was performed to assess early pregnancy. COMPARISON:  None. FINDINGS: Intrauterine gestational sac: Single Yolk sac:  Present Embryo:  Present Cardiac Activity: Present Heart Rate: 92 bpm CRL: 2.0 mm   5 w   5 d                  [place] EDC: 11/09/2020 Subchorionic hemorrhage:  None visualized. Maternal uterus/adnexae: Ovaries are normal in appearance bilaterally. No adnexal mass or free fluid. IMPRESSION: 1. Single viable intrauterine pregnancy as above, estimated gestational age [redacted] weeks and 5 days by crown-rump length, with ultrasound EDC of 11/09/2020. No complication. 2. No other acute maternal uterine or adnexal abnormality identified. Electronically Signed   By: Rise Mu M.D.   On: 03/14/2020 05:16     Assessment and Plan  Intrauterine pregnancy  - Keep scheduled appt with [facility] on 04/11/2020  Abdominal pain during pregnancy in first trimester - May take Tylenol 1000 mg po every 6 hrs prn pain - Information provided on abd pain in preg   Morning sickness  - Rx for Reglan 10 mg every 6 hrs prn N/V - Information provided on morning sickness   - Discharge patient - Patient verbalized an understanding of the plan of care and agrees.     [name], MSN, CNM 03/14/2020, 4:41 AM

## 2020-03-18 ENCOUNTER — Other Ambulatory Visit: Payer: Self-pay

## 2020-03-18 ENCOUNTER — Inpatient Hospital Stay (HOSPITAL_COMMUNITY)
Admission: AD | Admit: 2020-03-18 | Discharge: 2020-03-18 | Disposition: A | Discharge status: Home / Self Care | Payer: Medicaid Other | Attending: Family Medicine | Admitting: Family Medicine

## 2020-03-18 DIAGNOSIS — Z3A01 Less than 8 weeks gestation of pregnancy: Secondary | ICD-10-CM | POA: Insufficient documentation

## 2020-03-18 DIAGNOSIS — F319 Bipolar disorder, unspecified: Secondary | ICD-10-CM | POA: Insufficient documentation

## 2020-03-18 DIAGNOSIS — K219 Gastro-esophageal reflux disease without esophagitis: Secondary | ICD-10-CM | POA: Diagnosis not present

## 2020-03-18 DIAGNOSIS — Z79899 Other long term (current) drug therapy: Secondary | ICD-10-CM | POA: Diagnosis not present

## 2020-03-18 DIAGNOSIS — O99341 Other mental disorders complicating pregnancy, first trimester: Secondary | ICD-10-CM | POA: Diagnosis not present

## 2020-03-18 DIAGNOSIS — Z881 Allergy status to other antibiotic agents status: Secondary | ICD-10-CM | POA: Insufficient documentation

## 2020-03-18 DIAGNOSIS — O219 Vomiting of pregnancy, unspecified: Secondary | ICD-10-CM

## 2020-03-18 DIAGNOSIS — Z9049 Acquired absence of other specified parts of digestive tract: Secondary | ICD-10-CM | POA: Insufficient documentation

## 2020-03-18 DIAGNOSIS — O99611 Diseases of the digestive system complicating pregnancy, first trimester: Secondary | ICD-10-CM | POA: Diagnosis not present

## 2020-03-18 LAB — URINALYSIS, ROUTINE W REFLEX MICROSCOPIC
Bilirubin Urine: NEGATIVE
Glucose, UA: NEGATIVE mg/dL
Hgb urine dipstick: NEGATIVE
Ketones, ur: 80 mg/dL — AB
Leukocytes,Ua: NEGATIVE
Nitrite: NEGATIVE
Protein, ur: NEGATIVE mg/dL
Specific Gravity, Urine: 1.025 (ref 1.005–1.030)
pH: 5 (ref 5.0–8.0)

## 2020-03-18 MED ORDER — ONDANSETRON 4 MG PO TBDP: 4.0000 mg | ORAL_TABLET | Freq: Three times a day (TID) | ORAL | 0 refills | Status: DC | PRN

## 2020-03-18 MED ORDER — PROMETHAZINE HCL 25 MG/ML IJ SOLN
25.0000 mg | Freq: Once | INTRAMUSCULAR | Status: AC
  Administered 2020-03-18: 25 mg via INTRAVENOUS
  Filled 2020-03-18: qty 1

## 2020-03-18 MED ORDER — LACTATED RINGERS IV BOLUS
1000.0000 mL | Freq: Once | INTRAVENOUS | Status: AC
  Administered 2020-03-18: 1000 mL via INTRAVENOUS

## 2020-03-18 MED ORDER — FAMOTIDINE 20 MG PO TABS: 20.0000 mg | ORAL_TABLET | Freq: Two times a day (BID) | ORAL | 0 refills | Status: DC

## 2020-03-18 MED ORDER — PROMETHAZINE HCL 25 MG PO TABS: 25.0000 mg | ORAL_TABLET | Freq: Four times a day (QID) | ORAL | 0 refills | Status: DC | PRN

## 2020-03-18 MED ORDER — FAMOTIDINE IN NACL 20-0.9 MG/50ML-% IV SOLN
20.0000 mg | Freq: Once | INTRAVENOUS | Status: AC
  Administered 2020-03-18: 20 mg via INTRAVENOUS
  Filled 2020-03-18: qty 50

## 2020-03-18 NOTE — Discharge Instructions (Signed)
Safe Medications in Pregnancy  ° °Acne: °Benzoyl Peroxide °Salicylic Acid ° °Backache/Headache: °Tylenol: 2 regular strength every 4 hours OR °             2 Extra strength every 6 hours ° °Colds/Coughs/Allergies: °Benadryl (alcohol free) 25 mg every 6 hours as needed °Breath right strips °Claritin °Cepacol throat lozenges °Chloraseptic throat spray °Cold-Eeze- up to three times per day °Cough drops, alcohol free °Flonase (by prescription only) °Guaifenesin °Mucinex °Robitussin DM (plain only, alcohol free) °Saline nasal spray/drops °Sudafed (pseudoephedrine) & Actifed ** use only after [redacted] weeks gestation and if you do not have high blood pressure °Tylenol °Vicks Vaporub °Zinc lozenges °Zyrtec  ° °Constipation: °Colace °Ducolax suppositories °Fleet enema °Glycerin suppositories °Metamucil °Milk of magnesia °Miralax °Senokot °Smooth move tea ° °Diarrhea: °Kaopectate °Imodium A-D ° °*NO pepto Bismol ° °Hemorrhoids: °Anusol °Anusol HC °Preparation H °Tucks ° °Indigestion: °Tums °Maalox °Mylanta °Zantac  °Pepcid ° °Insomnia: °Benadryl (alcohol free) 25mg every 6 hours as needed °Tylenol PM °Unisom, no Gelcaps ° °Leg Cramps: °Tums °MagGel ° °Nausea/Vomiting:  °Bonine °Dramamine °Emetrol °Ginger extract °Sea bands °Meclizine  °Nausea medication to take during pregnancy:  °Unisom (doxylamine succinate 25 mg tablets) Take one tablet daily at bedtime. If symptoms are not adequately controlled, the dose can be increased to a maximum recommended dose of two tablets daily (1/2 tablet in the morning, 1/2 tablet mid-afternoon and one at bedtime). °Vitamin B6 100mg tablets. Take one tablet twice a day (up to 200 mg per day). ° °Skin Rashes: °Aveeno products °Benadryl cream or 25mg every 6 hours as needed °Calamine Lotion °1% cortisone cream ° °Yeast infection: °Gyne-lotrimin 7 °Monistat 7 ° ° °**If taking multiple medications, please check labels to avoid duplicating the same active ingredients °**take medication as directed on  the label °** Do not exceed 4000 mg of tylenol in 24 hours °**Do not take medications that contain aspirin or ibuprofen ° ° ° ° °First Trimester of Pregnancy °The first trimester of pregnancy is from week 1 until the end of week 13 (months 1 through 3). A week after a sperm fertilizes an egg, the egg will implant on the wall of the uterus. This embryo will begin to develop into a baby. Genes from you and your partner will form the baby. The female genes will determine whether the baby will be a boy or a girl. At 6-8 weeks, the eyes and face will be formed, and the heartbeat can be seen on ultrasound. At the end of 12 weeks, all the baby's organs will be formed. °Now that you are pregnant, you will want to do everything you can to have a healthy baby. Two of the most important things are to get good prenatal care and to follow your health care provider's instructions. Prenatal care is all the medical care you receive before the baby's birth. This care will help prevent, find, and treat any problems during the pregnancy and childbirth. °Body changes during your first trimester °Your body goes through many changes during pregnancy. The changes vary from woman to woman. °· You may gain or lose a couple of pounds at first. °· You may feel sick to your stomach (nauseous) and you may throw up (vomit). If the vomiting is uncontrollable, call your health care provider. °· You may tire easily. °· You may develop headaches that can be relieved by medicines. All medicines should be approved by your health care provider. °· You may urinate more often. Painful urination may mean you have   a bladder infection. °· You may develop heartburn as a result of your pregnancy. °· You may develop constipation because certain hormones are causing the muscles that push stool through your intestines to slow down. °· You may develop hemorrhoids or swollen veins (varicose veins). °· Your breasts may begin to grow larger and become tender. Your  nipples may stick out more, and the tissue that surrounds them (areola) may become darker. °· Your gums may bleed and may be sensitive to brushing and flossing. °· Dark spots or blotches (chloasma, mask of pregnancy) may develop on your face. This will likely fade after the baby is born. °· Your menstrual periods will stop. °· You may have a loss of appetite. °· You may develop cravings for certain kinds of food. °· You may have changes in your emotions from day to day, such as being excited to be pregnant or being concerned that something may go wrong with the pregnancy and baby. °· You may have more vivid and strange dreams. °· You may have changes in your hair. These can include thickening of your hair, rapid growth, and changes in texture. Some women also have hair loss during or after pregnancy, or hair that feels dry or thin. Your hair will most likely return to normal after your baby is born. °What to expect at prenatal visits °During a routine prenatal visit: °· You will be weighed to make sure you and the baby are growing normally. °· Your blood pressure will be taken. °· Your abdomen will be measured to track your baby's growth. °· The fetal heartbeat will be listened to between weeks 10 and 14 of your pregnancy. °· Test results from any previous visits will be discussed. °Your health care provider may ask you: °· How you are feeling. °· If you are feeling the baby move. °· If you have had any abnormal symptoms, such as leaking fluid, bleeding, severe headaches, or abdominal cramping. °· If you are using any tobacco products, including cigarettes, chewing tobacco, and electronic cigarettes. °· If you have any questions. °Other tests that may be performed during your first trimester include: °· Blood tests to find your blood type and to check for the presence of any previous infections. The tests will also be used to check for low iron levels (anemia) and protein on red blood cells (Rh antibodies).  Depending on your risk factors, or if you previously had diabetes during pregnancy, you may have tests to check for high blood sugar that affects pregnant women (gestational diabetes). °· Urine tests to check for infections, diabetes, or protein in the urine. °· An ultrasound to confirm the proper growth and development of the baby. °· Fetal screens for spinal cord problems (spina bifida) and Down syndrome. °· HIV (human immunodeficiency virus) testing. Routine prenatal testing includes screening for HIV, unless you choose not to have this test. °· You may need other tests to make sure you and the baby are doing well. °Follow these instructions at home: °Medicines °· Follow your health care provider's instructions regarding medicine use. Specific medicines may be either safe or unsafe to take during pregnancy. °· Take a prenatal vitamin that contains at least 600 micrograms (mcg) of folic acid. °· If you develop constipation, try taking a stool softener if your health care provider approves. °Eating and drinking ° °· Eat a balanced diet that includes fresh fruits and vegetables, whole grains, good sources of protein such as meat, eggs, or tofu, and low-fat dairy. Your health   care provider will help you determine the amount of weight gain that is right for you.  Avoid raw meat and uncooked cheese. These carry germs that can cause birth defects in the baby.  Eating four or five small meals rather than three large meals a day may help relieve nausea and vomiting. If you start to feel nauseous, eating a few soda crackers can be helpful. Drinking liquids between meals, instead of during meals, also seems to help ease nausea and vomiting.  Limit foods that are high in fat and processed sugars, such as fried and sweet foods.  To prevent constipation: ? Eat foods that are high in fiber, such as fresh fruits and vegetables, whole grains, and beans. ? Drink enough fluid to keep your urine clear or pale  yellow. Activity  Exercise only as directed by your health care provider. Most women can continue their usual exercise routine during pregnancy. Try to exercise for 30 minutes at least 5 days a week. Exercising will help you: ? Control your weight. ? Stay in shape. ? Be prepared for labor and delivery.  Experiencing pain or cramping in the lower abdomen or lower back is a good sign that you should stop exercising. Check with your health care provider before continuing with normal exercises.  Try to avoid standing for long periods of time. Move your legs often if you must stand in one place for a long time.  Avoid heavy lifting.  Wear low-heeled shoes and practice good posture.  You may continue to have sex unless your health care provider tells you not to. Relieving pain and discomfort  Wear a good support bra to relieve breast tenderness.  Take warm sitz baths to soothe any pain or discomfort caused by hemorrhoids. Use hemorrhoid cream if your health care provider approves.  Rest with your legs elevated if you have leg cramps or low back pain.  If you develop varicose veins in your legs, wear support hose. Elevate your feet for 15 minutes, 3-4 times a day. Limit salt in your diet. Prenatal care  Schedule your prenatal visits by the twelfth week of pregnancy. They are usually scheduled monthly at first, then more often in the last 2 months before delivery.  Write down your questions. Take them to your prenatal visits.  Keep all your prenatal visits as told by your health care provider. This is important. Safety  Wear your seat belt at all times when driving.  Make a list of emergency phone numbers, including numbers for family, friends, the hospital, and police and fire departments. General instructions  Ask your health care provider for a referral to a local prenatal education class. Begin classes no later than the beginning of month 6 of your pregnancy.  Ask for help if  you have counseling or nutritional needs during pregnancy. Your health care provider can offer advice or refer you to specialists for help with various needs.  Do not use hot tubs, steam rooms, or saunas.  Do not douche or use tampons or scented sanitary pads.  Do not cross your legs for long periods of time.  Avoid cat litter boxes and soil used by cats. These carry germs that can cause birth defects in the baby and possibly loss of the fetus by miscarriage or stillbirth.  Avoid all smoking, herbs, alcohol, and medicines not prescribed by your health care provider. Chemicals in these products affect the formation and growth of the baby.  Do not use any products that contain nicotine  or tobacco, such as cigarettes and e-cigarettes. If you need help quitting, ask your health care provider. You may receive counseling support and other resources to help you quit. °· Schedule a dentist appointment. At home, brush your teeth with a soft toothbrush and be gentle when you floss. °Contact a health care provider if: °· You have dizziness. °· You have mild pelvic cramps, pelvic pressure, or nagging pain in the abdominal area. °· You have persistent nausea, vomiting, or diarrhea. °· You have a bad smelling vaginal discharge. °· You have pain when you urinate. °· You notice increased swelling in your face, hands, legs, or ankles. °· You are exposed to fifth disease or chickenpox. °· You are exposed to German measles (rubella) and have never had it. °Get help right away if: °· You have a fever. °· You are leaking fluid from your vagina. °· You have spotting or bleeding from your vagina. °· You have severe abdominal cramping or pain. °· You have rapid weight gain or loss. °· You vomit blood or material that looks like coffee grounds. °· You develop a severe headache. °· You have shortness of breath. °· You have any kind of trauma, such as from a fall or a car accident. °Summary °· The first trimester of pregnancy is  from week 1 until the end of week 13 (months 1 through 3). °· Your body goes through many changes during pregnancy. The changes vary from woman to woman. °· You will have routine prenatal visits. During those visits, your health care provider will examine you, discuss any test results you may have, and talk with you about how you are feeling. °This information is not intended to replace advice given to you by your health care provider. Make sure you discuss any questions you have with your health care provider. °Document Revised: 06/20/2017 Document Reviewed: 06/19/2016 °Elsevier Patient Education © 2020 [employer] ° °

## 2020-03-18 NOTE — MAU Note (Signed)
Leah [name] is a 24 y.o. at [redacted]w[redacted]d here in MAU reporting: nausea and vomiting for 3 days, states unable to keep anything down. Emesis x 5 in the past 24 hours, currently feels nauseous. Denies pain, bleeding, or discharge.  Onset of complaint: ongoing  Pain score: 0/10  Vitals:   03/18/20 1754  BP: 118/66  Pulse: 85  Resp: 16  Temp: 98.9 F (37.2 C)  SpO2: 99%     Lab orders placed from triage: UA

## 2020-03-18 NOTE — MAU Provider Note (Signed)
History     CSN: [id number]  Arrival date and time: 03/18/20 1739   First Provider Initiated Contact with Patient 03/18/20 1830      Chief Complaint  Patient presents with  . Nausea  . Emesis   HPI Leah [name] is a 24 y.o. G2P1001 at [redacted]w[redacted]d who presents with nausea and vomiting. She states she has thrown up 5 times today. She reports taking Reglan with no improvement. She denies any vaginal bleeding or discharge. Denies any pain.   OB History    Gravida  2   Para  1   Term  1   Preterm  0   AB  0   Living  1     SAB  0   TAB  0   Ectopic  0   Multiple      Live Births  1           Past Medical History:  Diagnosis Date  . Anemia 2013  . Anxiety 2011   no meds during pregnancy  . Bipolar 1 disorder (HCC)   . Chlamydia   . Gall stones   . GERD (gastroesophageal reflux disease)   . 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 Seizure [facility]) 2011   pseudoseizures    Past Surgical History:  Procedure Laterality Date  . CHOLECYSTECTOMY N/A 09/28/2014   Procedure: LAPAROSCOPIC CHOLECYSTECTOMY;  Surgeon: Axel Filler, MD;  Location: [facility] OR;  Service: General;  Laterality: N/A;  . CHOLECYSTECTOMY    . DIRECT LARYNGOSCOPY N/A 04/24/2014   Procedure: DIRECT LARYNGOSCOPY;  Surgeon: [name], MD;  Location: [facility] OR;  Service: ENT;  Laterality: N/A;  . FOREIGN BODY REMOVAL ESOPHAGEAL N/A 04/24/2014   Procedure: REMOVAL FOREIGN BODY ESOPHAGEAL;  Surgeon: [name], MD;  Location: [facility] OR;  Service: ENT;  Laterality: N/A;  . WISDOM TOOTH EXTRACTION  2013    Family History  Problem Relation Age of Onset  . Arthritis Mother   . Bronchitis Mother   . Asthma Mother   . Hearing loss Paternal Grandfather   . Diabetes Neg Hx   . Stomach cancer Neg Hx   . Colon cancer Neg Hx     Social History   Tobacco Use  . Smoking status: Never Smoker  . Smokeless tobacco: Never Used  . Tobacco comment: marjiuna  Vaping Use  . Vaping Use: Never used   Substance Use Topics  . Alcohol use: No    Alcohol/week: 6.0 standard drinks    Types: 6 Cans of beer per week  . Drug use: Yes    Types: Marijuana    Comment: occ.    Allergies:  Allergies  Allergen Reactions  . Bactrim [Sulfamethoxazole-Trimethoprim] Rash    Medications Prior to Admission  Medication Sig Dispense Refill Last Dose  . metoCLOPramide (REGLAN) 10 MG tablet Take 1 tablet (10 mg total) by mouth every 6 (six) hours. 120 tablet 0   . Prenatal Vit-Fe Fumarate-FA (PRENATAL MULTIVITAMIN) TABS tablet Take 1 tablet by mouth daily at 12 noon.       Review of Systems  Constitutional: Negative.  Negative for fatigue and fever.  HENT: Negative.   Respiratory: Negative.  Negative for shortness of breath.   Cardiovascular: Negative.  Negative for chest pain.  Gastrointestinal: Positive for nausea and vomiting. Negative for abdominal pain, constipation and diarrhea.  Genitourinary: Negative.  Negative for dysuria, vaginal bleeding and vaginal discharge.  Neurological: Negative.  Negative  for dizziness and headaches.   Physical Exam   Blood pressure 118/66, pulse 85, temperature 98.9 F (37.2 C), temperature source Oral, resp. rate 16, height 5' (1.524 m), weight 62.3 kg, last menstrual period 02/01/2020, SpO2 99 %.  Physical Exam Vitals and nursing note reviewed.  Constitutional:      General: She is not in acute distress.    Appearance: She is well-developed.  HENT:     Head: Normocephalic.  Eyes:     Pupils: Pupils are equal, round, and reactive to light.  Cardiovascular:     Rate and Rhythm: Normal rate and regular rhythm.     Heart sounds: Normal heart sounds.  Pulmonary:     Effort: Pulmonary effort is normal. No respiratory distress.     Breath sounds: Normal breath sounds.  Abdominal:     General: Bowel sounds are normal. There is no distension.     Palpations: Abdomen is soft.     Tenderness: There is no abdominal tenderness.  Skin:    General: Skin is  warm and dry.  Neurological:     Mental Status: She is alert and oriented to person, place, and time.  Psychiatric:        Behavior: Behavior normal.        Thought Content: Thought content normal.        Judgment: Judgment normal.     MAU Course  Procedures Results for orders placed or performed during the hospital encounter of 03/18/20 (from the past 24 hour(s))  Urinalysis, Routine w reflex microscopic Urine, Clean Catch     Status: Abnormal   Collection Time: 03/18/20  6:26 PM  Result Value Ref Range   Color, Urine YELLOW YELLOW   APPearance HAZY (A) CLEAR   Specific Gravity, Urine 1.025 1.005 - 1.030   pH 5.0 5.0 - 8.0   Glucose, UA NEGATIVE NEGATIVE mg/dL   Hgb urine dipstick NEGATIVE NEGATIVE   Bilirubin Urine NEGATIVE NEGATIVE   Ketones, ur 80 (A) NEGATIVE mg/dL   Protein, ur NEGATIVE NEGATIVE mg/dL   Nitrite NEGATIVE NEGATIVE   Leukocytes,Ua NEGATIVE NEGATIVE   MDM UA LR bolus Phenergan IV Pepcid IV   Assessment and Plan   1. Nausea and vomiting during pregnancy   2. [redacted] weeks gestation of pregnancy    -Discharge home in stable condition -Rx for phenergan, pepcid and zofran sent to patient's pharmacy -Nausea and vomiting precautions discussed -Patient advised to follow-up with OB to start prenatal care -Patient may return to MAU as needed or if her condition were to change or worsen   Rolm Bookbinder CNM 03/18/2020, 6:30 PM

## 2020-03-20 ENCOUNTER — Encounter (HOSPITAL_COMMUNITY): Payer: Self-pay | Admitting: Obstetrics and Gynecology

## 2020-03-20 ENCOUNTER — Inpatient Hospital Stay (HOSPITAL_COMMUNITY)
Admission: AD | Admit: 2020-03-20 | Discharge: 2020-03-20 | Disposition: A | Discharge status: Home / Self Care | Payer: Medicaid Other | Attending: Obstetrics and Gynecology | Admitting: Obstetrics and Gynecology

## 2020-03-20 ENCOUNTER — Other Ambulatory Visit: Payer: Self-pay

## 2020-03-20 DIAGNOSIS — O99341 Other mental disorders complicating pregnancy, first trimester: Secondary | ICD-10-CM | POA: Insufficient documentation

## 2020-03-20 DIAGNOSIS — Z3A01 Less than 8 weeks gestation of pregnancy: Secondary | ICD-10-CM | POA: Insufficient documentation

## 2020-03-20 DIAGNOSIS — K219 Gastro-esophageal reflux disease without esophagitis: Secondary | ICD-10-CM | POA: Diagnosis not present

## 2020-03-20 DIAGNOSIS — F319 Bipolar disorder, unspecified: Secondary | ICD-10-CM | POA: Diagnosis not present

## 2020-03-20 DIAGNOSIS — O210 Mild hyperemesis gravidarum: Secondary | ICD-10-CM | POA: Insufficient documentation

## 2020-03-20 DIAGNOSIS — Z79899 Other long term (current) drug therapy: Secondary | ICD-10-CM | POA: Insufficient documentation

## 2020-03-20 DIAGNOSIS — Z9049 Acquired absence of other specified parts of digestive tract: Secondary | ICD-10-CM | POA: Diagnosis not present

## 2020-03-20 DIAGNOSIS — Z881 Allergy status to other antibiotic agents status: Secondary | ICD-10-CM | POA: Insufficient documentation

## 2020-03-20 DIAGNOSIS — O99611 Diseases of the digestive system complicating pregnancy, first trimester: Secondary | ICD-10-CM | POA: Insufficient documentation

## 2020-03-20 LAB — URINALYSIS, ROUTINE W REFLEX MICROSCOPIC
Bilirubin Urine: NEGATIVE
Glucose, UA: NEGATIVE mg/dL
Hgb urine dipstick: NEGATIVE
Ketones, ur: NEGATIVE mg/dL
Nitrite: NEGATIVE
Protein, ur: 30 mg/dL — AB
Specific Gravity, Urine: 1.031 — ABNORMAL HIGH (ref 1.005–1.030)
pH: 5 (ref 5.0–8.0)

## 2020-03-20 MED ORDER — FAMOTIDINE IN NACL 20-0.9 MG/50ML-% IV SOLN
20.0000 mg | Freq: Once | INTRAVENOUS | Status: AC
  Administered 2020-03-20: 20 mg via INTRAVENOUS
  Filled 2020-03-20: qty 50

## 2020-03-20 MED ORDER — PROMETHAZINE HCL 25 MG/ML IJ SOLN
25.0000 mg | Freq: Once | INTRAVENOUS | Status: AC
  Administered 2020-03-20: 25 mg via INTRAVENOUS
  Filled 2020-03-20: qty 1

## 2020-03-20 MED ORDER — ACETAMINOPHEN 500 MG PO TABS
1000.0000 mg | ORAL_TABLET | Freq: Once | ORAL | Status: AC
  Administered 2020-03-20: 1000 mg via ORAL
  Filled 2020-03-20: qty 2

## 2020-03-20 MED ORDER — M.V.I. ADULT IV INJ
Freq: Once | INTRAVENOUS | Status: AC
  Filled 2020-03-20: qty 1000

## 2020-03-20 NOTE — MAU Provider Note (Signed)
History     CSN: [id number]  Arrival date and time: 03/20/20 1520   First Provider Initiated Contact with Patient 03/20/20 1643      Chief Complaint  Patient presents with  . Pelvic Pain  . Emesis  . Dizziness   [name] is a 24 y.o. year old G21P1001 female at [redacted]w[redacted]d weeks gestation who presents to MAU reporting N/V where she was seen in MAU on 03/18/2020. She started vomiting at work today and felt dizzy. She works at [employer] and was not able to take Phenergan. She reports Phenergan helps, but Zofran does not help. She also reports intermittent pelvic pain since finding out about pregnancy; it's more constant today. She plans to start OB care with [facility]; first appointment on 04/18/2020.   OB History    Gravida  2   Para  1   Term  1   Preterm  0   AB  0   Living  1     SAB  0   TAB  0   Ectopic  0   Multiple      Live Births  1           Past Medical History:  Diagnosis Date  . Anemia 2013  . Anxiety 2011   no meds during pregnancy  . Bipolar 1 disorder (HCC)   . Chlamydia   . Gall stones   . GERD (gastroesophageal reflux disease)   . 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 Seizure [facility]) 2011   pseudoseizures    Past Surgical History:  Procedure Laterality Date  . CHOLECYSTECTOMY N/A 09/28/2014   Procedure: LAPAROSCOPIC CHOLECYSTECTOMY;  Surgeon: Axel Filler, MD;  Location: [facility] OR;  Service: General;  Laterality: N/A;  . CHOLECYSTECTOMY    . DIRECT LARYNGOSCOPY N/A 04/24/2014   Procedure: DIRECT LARYNGOSCOPY;  Surgeon: [name], MD;  Location: [facility] OR;  Service: ENT;  Laterality: N/A;  . FOREIGN BODY REMOVAL ESOPHAGEAL N/A 04/24/2014   Procedure: REMOVAL FOREIGN BODY ESOPHAGEAL;  Surgeon: [name], MD;  Location: [facility] OR;  Service: ENT;  Laterality: N/A;  . WISDOM TOOTH EXTRACTION  2013    Family History  Problem Relation Age of Onset  . Arthritis Mother   . Bronchitis Mother   . Asthma Mother   . Hearing loss  Paternal Grandfather   . Diabetes Neg Hx   . Stomach cancer Neg Hx   . Colon cancer Neg Hx     Social History   Tobacco Use  . Smoking status: Never Smoker  . Smokeless tobacco: Never Used  . Tobacco comment: marjiuna  Vaping Use  . Vaping Use: Never used  Substance Use Topics  . Alcohol use: Not Currently    Alcohol/week: 6.0 standard drinks    Types: 6 Cans of beer per week  . Drug use: Yes    Types: Marijuana    Comment: last used before pregnancy    Allergies:  Allergies  Allergen Reactions  . Bactrim [Sulfamethoxazole-Trimethoprim] Rash    Medications Prior to Admission  Medication Sig Dispense Refill Last Dose  . famotidine (PEPCID) 20 MG tablet Take 1 tablet (20 mg total) by mouth 2 (two) times daily. 30 tablet 0 03/19/2020 at Unknown time  . metoCLOPramide (REGLAN) 10 MG tablet Take 1 tablet (10 mg total) by mouth every 6 (six) hours. 120 tablet 0 Past Week at Unknown time  . ondansetron (ZOFRAN ODT) 4 MG  disintegrating tablet Take 1 tablet (4 mg total) by mouth every 8 (eight) hours as needed for nausea or vomiting. 30 tablet 0 03/20/2020 at Unknown time  . Prenatal Vit-Fe Fumarate-FA (PRENATAL MULTIVITAMIN) TABS tablet Take 1 tablet by mouth daily at 12 noon.   03/20/2020 at Unknown time  . promethazine (PHENERGAN) 25 MG tablet Take 1 tablet (25 mg total) by mouth every 6 (six) hours as needed for nausea or vomiting. 30 tablet 0 03/19/2020 at Unknown time    Review of Systems  Constitutional: Negative.   HENT: Negative.   Eyes: Negative.   Respiratory: Negative.   Cardiovascular: Negative.   Gastrointestinal: Positive for nausea and vomiting.  Endocrine: Negative.   Genitourinary: Positive for pelvic pain.  Musculoskeletal: Negative.   Skin: Negative.   Allergic/Immunologic: Negative.   Neurological: Negative.   Hematological: Negative.   Psychiatric/Behavioral: Negative.    Physical Exam   Blood pressure 110/70, pulse 74, temperature 98.7 F (37.1 C),  temperature source Oral, resp. rate 14, height 5' (1.524 m), weight 62.1 kg, last menstrual period 02/01/2020, SpO2 100 %.  Physical Exam Vitals and nursing note reviewed.  Constitutional:      Appearance: Normal appearance. She is normal weight.  HENT:     Head: Normocephalic and atraumatic.  Eyes:     Pupils: Pupils are equal, round, and reactive to light.  Abdominal:     General: Abdomen is flat.  Musculoskeletal:        General: Normal range of motion.  Skin:    General: Skin is warm and dry.  Neurological:     Mental Status: She is alert and oriented to person, place, and time.  Psychiatric:        Mood and Affect: Mood normal.        Behavior: Behavior normal.        Thought Content: Thought content normal.        Judgment: Judgment normal.    MAU Course  Procedures  MDM CCUA IVFs: Phenergan 25 mg in LR 1000 ml @ 999 ml/hr; followed by MVI in LR 1000 ml @ 500 ml/hr -- nausea/vomiting somewhat improved PO Challenge -- patient tolerated, but some emesis of crackers and gingerale, but does feel better  Results for orders placed or performed during the hospital encounter of 03/20/20 (from the past 24 hour(s))  Urinalysis, Routine w reflex microscopic Urine, Clean Catch     Status: Abnormal   Collection Time: 03/20/20  4:41 PM  Result Value Ref Range   Color, Urine YELLOW YELLOW   APPearance HAZY (A) CLEAR   Specific Gravity, Urine 1.031 (H) 1.005 - 1.030   pH 5.0 5.0 - 8.0   Glucose, UA NEGATIVE NEGATIVE mg/dL   Hgb urine dipstick NEGATIVE NEGATIVE   Bilirubin Urine NEGATIVE NEGATIVE   Ketones, ur NEGATIVE NEGATIVE mg/dL   Protein, ur 30 (A) NEGATIVE mg/dL   Nitrite NEGATIVE NEGATIVE   Leukocytes,Ua TRACE (A) NEGATIVE   RBC / HPF 0-5 0 - 5 RBC/hpf   WBC, UA 0-5 0 - 5 WBC/hpf   Bacteria, UA FEW (A) NONE SEEN   Squamous Epithelial / LPF 11-20 0 - 5   Mucus PRESENT     Assessment and Plan  Morning sickness - Plan: Discharge patient - Advised to continue current  regimen of anti-emetics (Phenergan and Zofran) - Information provided on morning sickness and bland diet - Keep scheduled appt with [facility] on 04/18/2020 - Patient verbalized an understanding of the plan of care and  agrees.     [name], MSN, CNM 03/20/2020, 4:45 PM

## 2020-03-20 NOTE — MAU Note (Signed)
Pt was seen in MAU on Saturday for n/v and sent home with Zofran and Promethazine.  Pt was at work today and started throwing up and began to feel dizzy around 3pm.  Was unable to take any medications since she was at work.  States the Promethazine helps, but the Zofran does not.  Pt has been having intermittent pelvic pain since finding out she was pregnant.  It comes and goes, but started back up this morning and has been constant today.

## 2020-03-23 ENCOUNTER — Other Ambulatory Visit: Payer: Self-pay

## 2020-03-23 DIAGNOSIS — O219 Vomiting of pregnancy, unspecified: Secondary | ICD-10-CM

## 2020-03-23 MED ORDER — DOXYLAMINE-PYRIDOXINE 10-10 MG PO TBEC: 2.0000 | DELAYED_RELEASE_TABLET | Freq: Every day | ORAL | 2 refills | Status: DC

## 2020-03-26 ENCOUNTER — Other Ambulatory Visit: Payer: Self-pay

## 2020-03-26 ENCOUNTER — Encounter (HOSPITAL_COMMUNITY): Payer: Self-pay | Admitting: Obstetrics and Gynecology

## 2020-03-26 ENCOUNTER — Inpatient Hospital Stay (HOSPITAL_COMMUNITY)
Admission: AD | Admit: 2020-03-26 | Discharge: 2020-03-26 | Disposition: A | Discharge status: Home / Self Care | Payer: Medicaid Other | Attending: Obstetrics and Gynecology | Admitting: Obstetrics and Gynecology

## 2020-03-26 DIAGNOSIS — Z881 Allergy status to other antibiotic agents status: Secondary | ICD-10-CM | POA: Diagnosis not present

## 2020-03-26 DIAGNOSIS — Z3A01 Less than 8 weeks gestation of pregnancy: Secondary | ICD-10-CM

## 2020-03-26 DIAGNOSIS — O99611 Diseases of the digestive system complicating pregnancy, first trimester: Secondary | ICD-10-CM | POA: Insufficient documentation

## 2020-03-26 DIAGNOSIS — O210 Mild hyperemesis gravidarum: Secondary | ICD-10-CM | POA: Insufficient documentation

## 2020-03-26 DIAGNOSIS — Z9049 Acquired absence of other specified parts of digestive tract: Secondary | ICD-10-CM | POA: Insufficient documentation

## 2020-03-26 DIAGNOSIS — K219 Gastro-esophageal reflux disease without esophagitis: Secondary | ICD-10-CM | POA: Insufficient documentation

## 2020-03-26 DIAGNOSIS — Z79899 Other long term (current) drug therapy: Secondary | ICD-10-CM | POA: Insufficient documentation

## 2020-03-26 DIAGNOSIS — F319 Bipolar disorder, unspecified: Secondary | ICD-10-CM | POA: Insufficient documentation

## 2020-03-26 DIAGNOSIS — O219 Vomiting of pregnancy, unspecified: Secondary | ICD-10-CM

## 2020-03-26 DIAGNOSIS — O99341 Other mental disorders complicating pregnancy, first trimester: Secondary | ICD-10-CM | POA: Diagnosis not present

## 2020-03-26 LAB — CBC WITH DIFFERENTIAL/PLATELET
Abs Immature Granulocytes: 0.02 10*3/uL (ref 0.00–0.07)
Basophils Absolute: 0 10*3/uL (ref 0.0–0.1)
Basophils Relative: 0 %
Eosinophils Absolute: 0.1 10*3/uL (ref 0.0–0.5)
Eosinophils Relative: 1 %
HCT: 36.7 % (ref 36.0–46.0)
Hemoglobin: 12.2 g/dL (ref 12.0–15.0)
Immature Granulocytes: 0 %
Lymphocytes Relative: 24 %
Lymphs Abs: 2 10*3/uL (ref 0.7–4.0)
MCH: 29.6 pg (ref 26.0–34.0)
MCHC: 33.2 g/dL (ref 30.0–36.0)
MCV: 89.1 fL (ref 80.0–100.0)
Monocytes Absolute: 0.6 10*3/uL (ref 0.1–1.0)
Monocytes Relative: 7 %
Neutro Abs: 5.9 10*3/uL (ref 1.7–7.7)
Neutrophils Relative %: 68 %
Platelets: 242 10*3/uL (ref 150–400)
RBC: 4.12 MIL/uL (ref 3.87–5.11)
RDW: 12.3 % (ref 11.5–15.5)
WBC: 8.6 10*3/uL (ref 4.0–10.5)
nRBC: 0 % (ref 0.0–0.2)

## 2020-03-26 LAB — URINALYSIS, ROUTINE W REFLEX MICROSCOPIC
Bilirubin Urine: NEGATIVE
Glucose, UA: NEGATIVE mg/dL
Hgb urine dipstick: NEGATIVE
Ketones, ur: 5 mg/dL — AB
Leukocytes,Ua: NEGATIVE
Nitrite: NEGATIVE
Protein, ur: 100 mg/dL — AB
Specific Gravity, Urine: 1.034 — ABNORMAL HIGH (ref 1.005–1.030)
pH: 5 (ref 5.0–8.0)

## 2020-03-26 LAB — BASIC METABOLIC PANEL
Anion gap: 7 (ref 5–15)
BUN: 7 mg/dL (ref 6–20)
CO2: 23 mmol/L (ref 22–32)
Calcium: 9 mg/dL (ref 8.9–10.3)
Chloride: 106 mmol/L (ref 98–111)
Creatinine, Ser: 0.56 mg/dL (ref 0.44–1.00)
GFR calc Af Amer: >60 mL/min (ref >60)
GFR calc non Af Amer: >60 mL/min (ref >60)
Glucose, Bld: 85 mg/dL (ref 70–99)
Potassium: 3.5 mmol/L (ref 3.5–5.1)
Sodium: 136 mmol/L (ref 135–145)

## 2020-03-26 MED ORDER — SCOPOLAMINE 1 MG/3DAYS TD PT72
1.0000 | MEDICATED_PATCH | Freq: Once | TRANSDERMAL | Status: DC
  Administered 2020-03-26: 1.5 mg via TRANSDERMAL
  Filled 2020-03-26: qty 1

## 2020-03-26 MED ORDER — SODIUM CHLORIDE 0.9 % IV SOLN
8.0000 mg | Freq: Once | INTRAVENOUS | Status: AC
  Administered 2020-03-26: 8 mg via INTRAVENOUS
  Filled 2020-03-26: qty 4

## 2020-03-26 MED ORDER — SCOPOLAMINE 1 MG/3DAYS TD PT72
1.0000 | MEDICATED_PATCH | TRANSDERMAL | 1 refills | Status: DC
Start: 2020-03-26 — End: 2020-04-07

## 2020-03-26 MED ORDER — PROMETHAZINE HCL 25 MG/ML IJ SOLN
25.0000 mg | Freq: Once | INTRAVENOUS | Status: AC
  Administered 2020-03-26: 25 mg via INTRAVENOUS
  Filled 2020-03-26: qty 1

## 2020-03-26 MED ORDER — FAMOTIDINE IN NACL 20-0.9 MG/50ML-% IV SOLN
20.0000 mg | Freq: Once | INTRAVENOUS | Status: AC
  Administered 2020-03-26: 20 mg via INTRAVENOUS
  Filled 2020-03-26: qty 50

## 2020-03-26 MED ORDER — M.V.I. ADULT IV INJ
Freq: Once | INTRAVENOUS | Status: AC
  Filled 2020-03-26: qty 1000

## 2020-03-26 NOTE — MAU Note (Signed)
Leah [name] is a 24 y.o. at [redacted]w[redacted]d here in MAU reporting: states meds at home are not working, she is not able to keep anything down. Emesis x 15 in the past 24 hours. Took diclegis this AM, took zofran and phenergan yesterday. Is no longer taking reglan.  Onset of complaint: ongoing  Pain score: 0/10  Vitals:   03/26/20 0845  BP: 115/69  Pulse: 94  Resp: 16  Temp: 99 F (37.2 C)  SpO2: 99%     Lab orders placed from triage: UA

## 2020-03-26 NOTE — MAU Note (Signed)
Patient tolerating sprite and crackers .

## 2020-03-26 NOTE — MAU Provider Note (Signed)
History     CSN: [id number]  Arrival date and time: 03/26/20 0762   First Provider Initiated Contact with Patient 03/26/20 0911      Chief Complaint  Patient presents with  . Emesis  . Nausea   [name] is a 24 y.o. year old G55P1001 female at [redacted]w[redacted]d weeks gestation who presents to MAU reporting the medications that she was prescribed for nausea and vomiting of pregnancy are not working.  She states that she is "not able to keep anything down".  She reports vomiting 15 times in the last 24 hours.  She has taken Diclegis this morning and it has not helped. She took Zofran and Phenergan yesterday and neither of those helped.  She is no longer taking Reglan.  She denies any pain at this time.   OB History    Gravida  2   Para  1   Term  1   Preterm  0   AB  0   Living  1     SAB  0   TAB  0   Ectopic  0   Multiple      Live Births  1           Past Medical History:  Diagnosis Date  . Anemia 2013  . Anxiety 2011   no meds during pregnancy  . Bipolar 1 disorder (HCC)   . Chlamydia   . Gall stones   . GERD (gastroesophageal reflux disease)   . 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 Seizure [facility]) 2011   pseudoseizures    Past Surgical History:  Procedure Laterality Date  . CHOLECYSTECTOMY N/A 09/28/2014   Procedure: LAPAROSCOPIC CHOLECYSTECTOMY;  Surgeon: Axel Filler, MD;  Location: [facility] OR;  Service: General;  Laterality: N/A;  . CHOLECYSTECTOMY    . DIRECT LARYNGOSCOPY N/A 04/24/2014   Procedure: DIRECT LARYNGOSCOPY;  Surgeon: [name], MD;  Location: [facility] OR;  Service: ENT;  Laterality: N/A;  . FOREIGN BODY REMOVAL ESOPHAGEAL N/A 04/24/2014   Procedure: REMOVAL FOREIGN BODY ESOPHAGEAL;  Surgeon: [name], MD;  Location: [facility] OR;  Service: ENT;  Laterality: N/A;  . WISDOM TOOTH EXTRACTION  2013    Family History  Problem Relation Age of Onset  . Arthritis Mother   . Bronchitis Mother   . Asthma Mother   . Hearing loss  Paternal Grandfather   . Diabetes Neg Hx   . Stomach cancer Neg Hx   . Colon cancer Neg Hx     Social History   Tobacco Use  . Smoking status: Never Smoker  . Smokeless tobacco: Never Used  . Tobacco comment: marjiuna  Vaping Use  . Vaping Use: Never used  Substance Use Topics  . Alcohol use: Not Currently    Alcohol/week: 6.0 standard drinks    Types: 6 Cans of beer per week  . Drug use: Yes    Types: Marijuana    Comment: last used before pregnancy    Allergies:  Allergies  Allergen Reactions  . Bactrim [Sulfamethoxazole-Trimethoprim] Rash    Medications Prior to Admission  Medication Sig Dispense Refill Last Dose  . Doxylamine-Pyridoxine (DICLEGIS) 10-10 MG TBEC Take 2 tablets by mouth at bedtime. May add 1 tablet at breakfast and 1 tablet at lunch if needed. 60 tablet 2 03/26/2020 at 0600  . ondansetron (ZOFRAN ODT) 4 MG disintegrating tablet Take 1 tablet (4 mg total) by mouth every 8 (eight) hours  as needed for nausea or vomiting. 30 tablet 0 03/25/2020 at 1800  . promethazine (PHENERGAN) 25 MG tablet Take 1 tablet (25 mg total) by mouth every 6 (six) hours as needed for nausea or vomiting. 30 tablet 0 03/25/2020 at 1800  . famotidine (PEPCID) 20 MG tablet Take 1 tablet (20 mg total) by mouth 2 (two) times daily. 30 tablet 0   . metoCLOPramide (REGLAN) 10 MG tablet Take 1 tablet (10 mg total) by mouth every 6 (six) hours. 120 tablet 0   . Prenatal Vit-Fe Fumarate-FA (PRENATAL MULTIVITAMIN) TABS tablet Take 1 tablet by mouth daily at 12 noon.       Review of Systems  Constitutional: Positive for appetite change.  HENT: Negative.   Eyes: Negative.   Respiratory: Negative.   Cardiovascular: Negative.   Gastrointestinal: Positive for nausea and vomiting (15x in 24 hrs).  Endocrine: Negative.   Genitourinary: Negative.   Musculoskeletal: Negative.   Skin: Negative.   Allergic/Immunologic: Negative.   Neurological: Negative.   Hematological: Negative.    Psychiatric/Behavioral: Negative.    Physical Exam   Blood pressure 115/69, pulse 94, temperature 99 F (37.2 C), temperature source Oral, resp. rate 16, height 5' (1.524 m), weight 61.5 kg, last menstrual period 02/01/2020, SpO2 99 %.  Physical Exam Vitals and nursing note reviewed.  Constitutional:      General: She is in acute distress.  HENT:     Head: Normocephalic and atraumatic.  Eyes:     Pupils: Pupils are equal, round, and reactive to light.  Cardiovascular:     Rate and Rhythm: Normal rate and regular rhythm.     Pulses: Normal pulses.  Pulmonary:     Effort: Pulmonary effort is normal.  Abdominal:     General: Abdomen is flat. Bowel sounds are decreased.     Palpations: Abdomen is soft.  Genitourinary:    Comments: Not indicated Musculoskeletal:     Cervical back: Normal range of motion.  Skin:    General: Skin is warm and dry.  Neurological:     Mental Status: She is alert and oriented to person, place, and time.  Psychiatric:        Mood and Affect: Mood normal.        Behavior: Behavior normal.        Thought Content: Thought content normal.        Judgment: Judgment normal.     MAU Course  Procedures  MDM CCUA CBC w/Diff BMP IVFs: Phenergan 25 mg in LR 1000 ml @ 999 ml/hr; followed by MVI in LR 1000 ml @ 999 ml/hr -- improved nausea/vomiting PO Challenge -- patient tolerated well, but still complaining of "not feeling well" Scopolamine Patch applied prior to d/c  Results for orders placed or performed during the hospital encounter of 03/26/20 (from the past 24 hour(s))  Urinalysis, Routine w reflex microscopic Urine, Clean Catch     Status: Abnormal   Collection Time: 03/26/20  8:46 AM  Result Value Ref Range   Color, Urine AMBER (A) YELLOW   APPearance CLOUDY (A) CLEAR   Specific Gravity, Urine 1.034 (H) 1.005 - 1.030   pH 5.0 5.0 - 8.0   Glucose, UA NEGATIVE NEGATIVE mg/dL   Hgb urine dipstick NEGATIVE NEGATIVE   Bilirubin Urine NEGATIVE  NEGATIVE   Ketones, ur 5 (A) NEGATIVE mg/dL   Protein, ur 338 (A) NEGATIVE mg/dL   Nitrite NEGATIVE NEGATIVE   Leukocytes,Ua NEGATIVE NEGATIVE   RBC / HPF 0-5 0 -  5 RBC/hpf   WBC, UA 6-10 0 - 5 WBC/hpf   Bacteria, UA RARE (A) NONE SEEN   Squamous Epithelial / LPF 21-50 0 - 5   Mucus PRESENT   CBC with Differential/Platelet     Status: None   Collection Time: 03/26/20  1:22 PM  Result Value Ref Range   WBC 8.6 4.0 - 10.5 K/uL   RBC 4.12 3.87 - 5.11 MIL/uL   Hemoglobin 12.2 12.0 - 15.0 g/dL   HCT 40.9 36 - 46 %   MCV 89.1 80.0 - 100.0 fL   MCH 29.6 26.0 - 34.0 pg   MCHC 33.2 30.0 - 36.0 g/dL   RDW 73.5 32.9 - 92.4 %   Platelets 242 150 - 400 K/uL   nRBC 0.0 0.0 - 0.2 %   Neutrophils Relative % 68 %   Neutro Abs 5.9 1.7 - 7.7 K/uL   Lymphocytes Relative 24 %   Lymphs Abs 2.0 0.7 - 4.0 K/uL   Monocytes Relative 7 %   Monocytes Absolute 0.6 0 - 1 K/uL   Eosinophils Relative 1 %   Eosinophils Absolute 0.1 0 - 0 K/uL   Basophils Relative 0 %   Basophils Absolute 0.0 0 - 0 K/uL   Immature Granulocytes 0 %   Abs Immature Granulocytes 0.02 0.00 - 0.07 K/uL  Basic metabolic panel     Status: None   Collection Time: 03/26/20  1:22 PM  Result Value Ref Range   Sodium 136 135 - 145 mmol/L   Potassium 3.5 3.5 - 5.1 mmol/L   Chloride 106 98 - 111 mmol/L   CO2 23 22 - 32 mmol/L   Glucose, Bld 85 70 - 99 mg/dL   BUN 7 6 - 20 mg/dL   Creatinine, Ser 2.68 0.44 - 1.00 mg/dL   Calcium 9.0 8.9 - 34.1 mg/dL   GFR calc non Af Amer >60 >60 mL/min   GFR calc Af Amer >60 >60 mL/min   Anion gap 7 5 - 15    Assessment and Plan  Morning sickness - Plan: Discharge patient - Rx for Scopolamine patch - instructed to be sure to wash hands thoroughly after handling the patch d/t possible temporary blindness or decreased vision if the medication gets into her eyes - Advised to take anti-emetic medications she has at home as prescribed - Keep scheduled appt with [facility] on 04/11/20 - Patient  verbalized an understanding of the plan of care and agrees.   [name], MSN, CNM 03/26/2020, 9:13 AM

## 2020-03-28 ENCOUNTER — Other Ambulatory Visit: Payer: Self-pay

## 2020-03-28 ENCOUNTER — Telehealth (INDEPENDENT_AMBULATORY_CARE_PROVIDER_SITE_OTHER): Payer: Medicaid Other | Admitting: *Deleted

## 2020-03-28 DIAGNOSIS — O219 Vomiting of pregnancy, unspecified: Secondary | ICD-10-CM

## 2020-03-28 DIAGNOSIS — Z3490 Encounter for supervision of normal pregnancy, unspecified, unspecified trimester: Secondary | ICD-10-CM

## 2020-03-28 DIAGNOSIS — O0990 Supervision of high risk pregnancy, unspecified, unspecified trimester: Secondary | ICD-10-CM

## 2020-03-28 DIAGNOSIS — F319 Bipolar disorder, unspecified: Secondary | ICD-10-CM

## 2020-03-28 DIAGNOSIS — R569 Unspecified convulsions: Secondary | ICD-10-CM

## 2020-03-28 DIAGNOSIS — K219 Gastro-esophageal reflux disease without esophagitis: Secondary | ICD-10-CM

## 2020-03-28 MED ORDER — BLOOD PRESSURE KIT DEVI: 1.0000 | 0 refills | Status: DC | PRN

## 2020-03-28 NOTE — Patient Instructions (Signed)
  At [facility], we work as an integrated team, providing care to address both physical and emotional health. Your medical provider may refer you to see our Behavioral Health Clinician (BHC) on the same day you see your medical provider, as availability permits.  Our BHC is available to all patients, visits generally last between 20-30 minutes, but can be longer or shorter, depending on patient need. The BHC offers help with stress management, coping with symptoms of depression and anxiety, major life changes , sleep issues, changing risky behavior, grief and loss, life stress, working on personal life goals, and  behavioral health issues, as these all affect your overall health and wellness.  The BHC is NOT available for the following: FMLA paperwork, court-ordered evaluations, specialty assessments (custody or disability), letters to employers, or obtaining certification for an emotional support animal. The BHC does not provide long-term therapy. You have the right to refuse integrated behavioral health services, or to reschedule to see the BHC at a later date.  Exception: If you are having thoughts of suicide, we require that you either see the BHC for further assessment, or contract for safety with your medical provider. Confidentiality exception: If it is suspected that a child or disabled adult is being abused or neglected, we are required by law to report that to either Child Protective Services or Adult Protective Services.  If you have a diagnosis of Bipolar affective disorder, Schizophrenia, or recurrent Major depressive disorder, we will recommend that you establish care with a psychiatrist, as these are lifelong, chronic conditions, and we want your overall emotional health and medications to be more closely monitored. If you anticipate needing extended maternity leave due to mental illness, it it recommended you inform your medical provider, so  we can put in a referral to a  psychiatrist as soon as possible. The BHC is unable to recommend an extended maternity leave for mental health issues. Your medical provider or BHC may refer you to a therapist for ongoing, traditional therapy, or to a psychiatrist, for medication management, if it would benefit your overall health. Depending on your insurance, you may have a copay to see the BHC. If you are uninsured, it is recommended that you apply for financial assistance. (Forms may be requested at the front desk for in-person visits, via MyChart, or request a form during a virtual visit).  If you see the BHC more than 6 times, you will have to complete a comprehensive clinical assessment interview with the BHC to resume integrated services.  For virtual visits with the BHC, you must be physically in the state of [place] at the time of the visit. For example, if you live in [place], you will have to do an in-person visit with the BHC. If you are going out of the state or country for any reason, the BHC may see you virtually when you return to [place], but not while you are physically outside of .    

## 2020-03-28 NOTE — Progress Notes (Signed)
New OB Intake  I connected with  Leah [name] on 03/28/20 at  2:15 PM EDT by MyChart and verified that I am speaking with the correct person using two identifiers. Nurse is located at [facility] and pt is located at home.  I discussed the limitations, risks, security and privacy concerns of performing an evaluation and management service by virtually and the  availability of in person appointments. I also discussed with the patient that there may be a patient responsible charge related to this service. The patient expressed understanding and agreed to proceed.  I explained I am completing New OB Intake today. We discussed her EDD of 11/07/20 that is based on LMP of 02/01/20. Pt is G2/1001. I reviewed her allergies, medications, Medical/Surgical/OB history, and appropriate screenings. I informed her of [facility] services. Based on history, this is a/an complicated  Pregnancy with severe morning sickness.   Concerns addressed today  MyChart/Babyscripts MyChart access verified. I explained pt will have some visits in office and some virtually. Babyscripts instructions given and babyscripts sent to patient with instructions to download later today.  Blood Pressure Cuff Blood pressure cuff ordered for patient to pick-up from [employer]. I asked Jacalyn to bring with her to her first ob appointment so we can show her how to use it. I explained after first prenatal appt pt will check weekly and document in Babyscripts.  Anatomy US Explained first scheduled [place] will be around 19 weeks. I explained she will receive notification of when  Anatomy US is  Scheduled via MyChart.   Labs Discussed Avelina Laine genetic screening with patient. Would like both Panorama and Horizon drawn at new OB visit. Routine prenatal labs needed.  First visit review I reviewed new OB appt with pt. I explained she will have a pelvic exam, ob bloodwork with genetic screening. I explained pt will be seen by Dr, Germaine Pomfret at first visit;  encounter routed to appropriate provider.  Leah [name] having severe morning sickness. Is using scopalamine patch, phenergan, zofran as ordered. States diclegis and reglan do not help. Advised to go back to MAU if she is unable to keep fluids down and / or feels lightheaded or dizzy. She voices understanding.   Sakiya Stepka,RN 03/28/2020  2:07 PM

## 2020-03-31 ENCOUNTER — Other Ambulatory Visit: Payer: Self-pay | Admitting: Student

## 2020-03-31 ENCOUNTER — Other Ambulatory Visit: Payer: Self-pay

## 2020-03-31 ENCOUNTER — Inpatient Hospital Stay (HOSPITAL_COMMUNITY)
Admission: AD | Admit: 2020-03-31 | Discharge: 2020-03-31 | Disposition: A | Discharge status: Home / Self Care | Payer: Medicaid Other | Attending: Obstetrics & Gynecology | Admitting: Obstetrics & Gynecology

## 2020-03-31 ENCOUNTER — Encounter (HOSPITAL_COMMUNITY): Payer: Self-pay | Admitting: Obstetrics & Gynecology

## 2020-03-31 DIAGNOSIS — R1030 Lower abdominal pain, unspecified: Secondary | ICD-10-CM | POA: Insufficient documentation

## 2020-03-31 DIAGNOSIS — K5900 Constipation, unspecified: Secondary | ICD-10-CM | POA: Diagnosis not present

## 2020-03-31 DIAGNOSIS — Z9049 Acquired absence of other specified parts of digestive tract: Secondary | ICD-10-CM | POA: Diagnosis not present

## 2020-03-31 DIAGNOSIS — O210 Mild hyperemesis gravidarum: Secondary | ICD-10-CM | POA: Insufficient documentation

## 2020-03-31 DIAGNOSIS — Z3A08 8 weeks gestation of pregnancy: Secondary | ICD-10-CM | POA: Diagnosis not present

## 2020-03-31 DIAGNOSIS — K219 Gastro-esophageal reflux disease without esophagitis: Secondary | ICD-10-CM | POA: Insufficient documentation

## 2020-03-31 DIAGNOSIS — Z881 Allergy status to other antibiotic agents status: Secondary | ICD-10-CM | POA: Insufficient documentation

## 2020-03-31 DIAGNOSIS — O99341 Other mental disorders complicating pregnancy, first trimester: Secondary | ICD-10-CM | POA: Insufficient documentation

## 2020-03-31 DIAGNOSIS — O219 Vomiting of pregnancy, unspecified: Secondary | ICD-10-CM | POA: Diagnosis not present

## 2020-03-31 DIAGNOSIS — F319 Bipolar disorder, unspecified: Secondary | ICD-10-CM | POA: Diagnosis not present

## 2020-03-31 DIAGNOSIS — O99611 Diseases of the digestive system complicating pregnancy, first trimester: Secondary | ICD-10-CM | POA: Insufficient documentation

## 2020-03-31 DIAGNOSIS — Z79899 Other long term (current) drug therapy: Secondary | ICD-10-CM | POA: Insufficient documentation

## 2020-03-31 DIAGNOSIS — O26891 Other specified pregnancy related conditions, first trimester: Secondary | ICD-10-CM | POA: Insufficient documentation

## 2020-03-31 DIAGNOSIS — O0990 Supervision of high risk pregnancy, unspecified, unspecified trimester: Secondary | ICD-10-CM | POA: Diagnosis not present

## 2020-03-31 LAB — URINALYSIS, ROUTINE W REFLEX MICROSCOPIC
Bilirubin Urine: NEGATIVE
Glucose, UA: NEGATIVE mg/dL
Hgb urine dipstick: NEGATIVE
Ketones, ur: 80 mg/dL — AB
Leukocytes,Ua: NEGATIVE
Nitrite: NEGATIVE
Protein, ur: 100 mg/dL — AB
Specific Gravity, Urine: 1.03 (ref 1.005–1.030)
pH: 5 (ref 5.0–8.0)

## 2020-03-31 LAB — CBC
HCT: 34.3 % — ABNORMAL LOW (ref 36.0–46.0)
Hemoglobin: 11.1 g/dL — ABNORMAL LOW (ref 12.0–15.0)
MCH: 29.2 pg (ref 26.0–34.0)
MCHC: 32.4 g/dL (ref 30.0–36.0)
MCV: 90.3 fL (ref 80.0–100.0)
Platelets: 247 10*3/uL (ref 150–400)
RBC: 3.8 MIL/uL — ABNORMAL LOW (ref 3.87–5.11)
RDW: 12.4 % (ref 11.5–15.5)
WBC: 7.9 10*3/uL (ref 4.0–10.5)
nRBC: 0 % (ref 0.0–0.2)

## 2020-03-31 LAB — COMPREHENSIVE METABOLIC PANEL
ALT: 30 U/L (ref 0–44)
AST: 24 U/L (ref 15–41)
Albumin: 3.2 g/dL — ABNORMAL LOW (ref 3.5–5.0)
Alkaline Phosphatase: 48 U/L (ref 38–126)
Anion gap: 10 (ref 5–15)
BUN: 9 mg/dL (ref 6–20)
CO2: 21 mmol/L — ABNORMAL LOW (ref 22–32)
Calcium: 8.8 mg/dL — ABNORMAL LOW (ref 8.9–10.3)
Chloride: 103 mmol/L (ref 98–111)
Creatinine, Ser: 0.52 mg/dL (ref 0.44–1.00)
GFR calc Af Amer: >60 mL/min (ref >60)
GFR calc non Af Amer: >60 mL/min (ref >60)
Glucose, Bld: 74 mg/dL (ref 70–99)
Potassium: 4.1 mmol/L (ref 3.5–5.1)
Sodium: 134 mmol/L — ABNORMAL LOW (ref 135–145)
Total Bilirubin: 0.6 mg/dL (ref 0.3–1.2)
Total Protein: 6.1 g/dL — ABNORMAL LOW (ref 6.5–8.1)

## 2020-03-31 MED ORDER — ONDANSETRON 4 MG PO TBDP
8.0000 mg | ORAL_TABLET | Freq: Once | ORAL | Status: AC
  Administered 2020-03-31: 8 mg via ORAL
  Filled 2020-03-31: qty 2

## 2020-03-31 MED ORDER — PROMETHAZINE HCL 25 MG/ML IJ SOLN
25.0000 mg | Freq: Once | INTRAMUSCULAR | Status: AC
  Administered 2020-03-31: 25 mg via INTRAVENOUS
  Filled 2020-03-31: qty 1

## 2020-03-31 MED ORDER — LACTATED RINGERS IV BOLUS
1000.0000 mL | Freq: Once | INTRAVENOUS | Status: AC
  Administered 2020-03-31: 1000 mL via INTRAVENOUS

## 2020-03-31 MED ORDER — ONDANSETRON 8 MG PO TBDP: 8.0000 mg | ORAL_TABLET | Freq: Three times a day (TID) | ORAL | 0 refills | Status: DC | PRN

## 2020-03-31 MED ORDER — PROMETHAZINE HCL 25 MG RE SUPP: RECTAL | 0 refills | Status: DC

## 2020-03-31 MED ORDER — METHYLPREDNISOLONE SODIUM SUCC 125 MG IJ SOLR
48.0000 mg | Freq: Once | INTRAMUSCULAR | Status: AC
  Administered 2020-03-31: 48 mg via INTRAVENOUS
  Filled 2020-03-31: qty 2

## 2020-03-31 NOTE — MAU Note (Signed)
Can't keep anything she eats or drink down.  meds aren't working. Last took something yesterday.

## 2020-03-31 NOTE — Discharge Instructions (Signed)
Morning Sickness  Morning sickness is when a woman feels nauseous during pregnancy. This nauseous feeling may or may not come with vomiting. It often occurs in the morning, but it can be a problem at any time of day. Morning sickness is most common during the first trimester. In some cases, it may continue throughout pregnancy. Although morning sickness is unpleasant, it is usually harmless unless the woman develops severe and continual vomiting (hyperemesis gravidarum), a condition that requires more intense treatment. What are the causes? The exact cause of this condition is not known, but it seems to be related to normal hormonal changes that occur in pregnancy. What increases the risk? You are more likely to develop this condition if:  You experienced nausea or vomiting before your pregnancy.  You had morning sickness during a previous pregnancy.  You are pregnant with more than one baby, such as twins. What are the signs or symptoms? Symptoms of this condition include:  Nausea.  Vomiting. How is this diagnosed? This condition is usually diagnosed based on your signs and symptoms. How is this treated? In many cases, treatment is not needed for this condition. Making some changes to what you eat may help to control symptoms. Your health care provider may also prescribe or recommend:  Vitamin B6 supplements.  Anti-nausea medicines.  Ginger. Follow these instructions at home: Medicines  Take over-the-counter and prescription medicines only as told by your health care provider. Do not use any prescription, over-the-counter, or herbal medicines for morning sickness without first talking with your health care provider.  Taking multivitamins before getting pregnant can prevent or decrease the severity of morning sickness in most women. Eating and drinking  Eat a piece of dry toast or crackers before getting out of bed in the morning.  Eat 5 or 6 small meals a day.  Eat dry and  bland foods, such as rice or a baked potato. Foods that are high in carbohydrates are often helpful.  Avoid greasy, fatty, and spicy foods.  Have someone cook for you if the smell of any food causes nausea and vomiting.  If you feel nauseous after taking prenatal vitamins, take the vitamins at night or with a snack.  Snack on protein foods between meals if you are hungry. Nuts, yogurt, and cheese are good options.  Drink fluids throughout the day.  Try ginger ale made with real ginger, ginger tea made from fresh grated ginger, or ginger candies. General instructions  Do not use any products that contain nicotine or tobacco, such as cigarettes and e-cigarettes. If you need help quitting, ask your health care provider.  Get an air purifier to keep the air in your house free of odors.  Get plenty of fresh air.  Try to avoid odors that trigger your nausea.  Consider trying these methods to help relieve symptoms: ? Wearing an acupressure wristband. These wristbands are often worn for seasickness. ? Acupuncture. Contact a health care provider if:  Your home remedies are not working and you need medicine.  You feel dizzy or light-headed.  You are losing weight. Get help right away if:  You have persistent and uncontrolled nausea and vomiting.  You faint.  You have severe pain in your abdomen. Summary  Morning sickness is when a woman feels nauseous during pregnancy. This nauseous feeling may or may not come with vomiting.  Morning sickness is most common during the first trimester.  It often occurs in the morning, but it can be a problem at   any time of day.  In many cases, treatment is not needed for this condition. Making some changes to what you eat may help to control symptoms. This information is not intended to replace advice given to you by your health care provider. Make sure you discuss any questions you have with your health care provider. Document Revised:  06/20/2017 Document Reviewed: 08/10/2016 Elsevier Patient Education  2020 Elsevier Inc.  

## 2020-03-31 NOTE — MAU Provider Note (Addendum)
History     CSN: [id number]  Arrival date and time: 03/31/20 6644   First Provider Initiated Contact with Patient 03/31/20 [id number]      Chief Complaint  Patient presents with  . Emesis   HPI Leah [name] is a 24 y.o. G2P1001 at [redacted]w[redacted]d who present to the MAU for persistent nausea and vomiting. She states that nausea and vomiting began at the start of pregnancy and has worsened over time. She did not have this problem in prior pregnancies. She has been to the MAU for this issue 4 times previously. She vomits on average 7-8x per day and vomit is yellow in color. She says she has not been able to keep food or liquid down for more than ten minutes for at least 3 weeks. She began experiencing dizzy spells and seeing "stars" when standing for 2 days now. Denies loss of consciousness, hematemesis, bilious vomiting, fevers, chills, vaginal bleeding, cramping, changes in discharge. She does report constipation with her last BM this morning being hard. She says she now has a BM every day to couple of days. She also reports 10/10 lower abdominal pain worse in the RLQ that is sharp in nature and does not radiate and is worsened by laying down. She came to the MAU for this same pain 08/24 with reassuring findings.  OB History    Gravida  2   Para  1   Term  1   Preterm  0   AB  0   Living  1     SAB  0   TAB  0   Ectopic  0   Multiple      Live Births  1           Past Medical History:  Diagnosis Date  . Anemia 2013  . Anxiety 2011   no meds during pregnancy  . Bipolar 1 disorder ([place])   . Chlamydia   . Gall stones   . GERD (gastroesophageal reflux disease)   . 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 CHOLECYSTECTOMY N/A 09/28/2014   Procedure: LAPAROSCOPIC CHOLECYSTECTOMY;  Surgeon: Ralene Ok, MD;  Location: [place];  Service: General;  Laterality: N/A;  .  CHOLECYSTECTOMY    . DIRECT LARYNGOSCOPY N/A 04/24/2014   Procedure: DIRECT LARYNGOSCOPY;  Surgeon: Ascencion Dike, MD;  Location: [place];  Service: ENT;  Laterality: N/A;  . FOREIGN BODY REMOVAL ESOPHAGEAL N/A 04/24/2014   Procedure: REMOVAL FOREIGN BODY ESOPHAGEAL;  Surgeon: Ascencion Dike, MD;  Location[facility] OR;  Service: ENT;  Laterality: N/A;  . WISDOM TOOTH EXTRACTION  2013    Family History  Problem Relation Age of Onset  . Arthritis Mother   . Bronchitis Mother   . Asthma Mother   . Hearing loss Paternal Grandfather   . Diabetes Neg Hx   . Stomach cancer Neg Hx   . Colon cancer Neg Hx     Social History   Tobacco Use  . Smoking status: Never Smoker  . Smokeless tobacco: Never Used  . Tobacco comment: marjiuna  Vaping Use  . Vaping Use: Never used  Substance Use Topics  . Alcohol use: Not Currently    Alcohol/week: 6.0 standard drinks    Types: 6 Cans of beer per week  . Drug use: Yes    Types: Marijuana  Comment: last used before pregnancy    Allergies:  Allergies  Allergen Reactions  . Bactrim [Sulfamethoxazole-Trimethoprim] Rash    Medications Prior to Admission  Medication Sig Dispense Refill Last Dose  . famotidine (PEPCID) 20 MG tablet Take 1 tablet (20 mg total) by mouth 2 (two) times daily. 30 tablet 0 03/30/2020 at Unknown time  . ondansetron (ZOFRAN ODT) 4 MG disintegrating tablet Take 1 tablet (4 mg total) by mouth every 8 (eight) hours as needed for nausea or vomiting. 30 tablet 0 03/30/2020 at Unknown time  . Prenatal Vit-Fe Fumarate-FA (PRENATAL MULTIVITAMIN) TABS tablet Take 1 tablet by mouth daily at 12 noon.   Past Week at Unknown time  . promethazine (PHENERGAN) 25 MG tablet Take 1 tablet (25 mg total) by mouth every 6 (six) hours as needed for nausea or vomiting. 30 tablet 0 03/30/2020 at Unknown time  . scopolamine (TRANSDERM-SCOP) 1 MG/3DAYS Place 1 patch (1.5 mg total) onto the skin every 3 (three) days. 1 patch 1 03/28/2020 at Unknown time  . Blood  Pressure Monitoring (BLOOD PRESSURE KIT) DEVI 1 Device by Does not apply route as needed. 1 each 0   . Doxylamine-Pyridoxine (DICLEGIS) 10-10 MG TBEC Take 2 tablets by mouth at bedtime. May add 1 tablet at breakfast and 1 tablet at lunch if needed. (Patient not taking: Reported on 03/28/2020) 60 tablet 2   . metoCLOPramide (REGLAN) 10 MG tablet Take 1 tablet (10 mg total) by mouth every 6 (six) hours. (Patient not taking: Reported on 03/28/2020) 120 tablet 0     Review of Systems  All other systems reviewed and are negative.  Physical Exam   Blood pressure 109/71, pulse 79, temperature 98.7 F (37.1 C), temperature source Oral, resp. rate 16, height 5' (1.524 m), weight 60.9 kg, last menstrual period 02/01/2020, SpO2 100 %.  Physical Exam Cardiovascular:     Rate and Rhythm: Normal rate and regular rhythm.     Pulses: Normal pulses.     Heart sounds: Normal heart sounds.  Pulmonary:     Effort: Pulmonary effort is normal.     Breath sounds: Normal breath sounds.  Abdominal:     General: Bowel sounds are normal.     Palpations: Abdomen is soft. There is no hepatomegaly.     Tenderness: There is abdominal tenderness in the right upper quadrant, right lower quadrant and left lower quadrant.     Comments: RLQ the worst  Musculoskeletal:     Right lower leg: No edema.     Left lower leg: No edema.  Skin:    General: Skin is warm and dry.  Neurological:     Mental Status: She is alert.     MAU Course  Procedures  MDM: Patient was given IVF with  2 LR 1 L bolus.  She was given some crackers and ate both in full, then vomited shortly after. She was given phenergan 25 mg IV with no response. She was then given zofran 8 mg and started on a steroid taper of methylprednisolone 48 mg IV with no response. About 30 minutes after administration she then vomited again.   Meds ordered this encounter  Medications  . lactated ringers bolus 1,000 mL  . promethazine (PHENERGAN) injection 25 mg   . ondansetron (ZOFRAN-ODT) disintegrating tablet 8 mg  . lactated ringers bolus 1,000 mL  . methylPREDNISolone sodium succinate (SOLU-MEDROL) 125 mg/2 mL injection 48 mg   Results for orders placed or performed during the hospital encounter of 03/31/20 (  from the past 24 hour(s))  Urinalysis, Routine w reflex microscopic Urine, Clean Catch     Status: Abnormal   Collection Time: 03/31/20  8:51 AM  Result Value Ref Range   Color, Urine AMBER (A) YELLOW   APPearance HAZY (A) CLEAR   Specific Gravity, Urine 1.030 1.005 - 1.030   pH 5.0 5.0 - 8.0   Glucose, UA NEGATIVE NEGATIVE mg/dL   Hgb urine dipstick NEGATIVE NEGATIVE   Bilirubin Urine NEGATIVE NEGATIVE   Ketones, ur 80 (A) NEGATIVE mg/dL   Protein, ur 100 (A) NEGATIVE mg/dL   Nitrite NEGATIVE NEGATIVE   Leukocytes,Ua NEGATIVE NEGATIVE   RBC / HPF 0-5 0 - 5 RBC/hpf   WBC, UA 0-5 0 - 5 WBC/hpf   Bacteria, UA RARE (A) NONE SEEN   Squamous Epithelial / LPF 6-10 0 - 5   Mucus PRESENT   CBC     Status: Abnormal   Collection Time: 03/31/20 11:33 AM  Result Value Ref Range   WBC 7.9 4.0 - 10.5 K/uL   RBC 3.80 (L) 3.87 - 5.11 MIL/uL   Hemoglobin 11.1 (L) 12.0 - 15.0 g/dL   HCT 34.3 (L) 36 - 46 %   MCV 90.3 80.0 - 100.0 fL   MCH 29.2 26.0 - 34.0 pg   MCHC 32.4 30.0 - 36.0 g/dL   RDW 12.4 11.5 - 15.5 %   Platelets 247 150 - 400 K/uL   nRBC 0.0 0.0 - 0.2 %  Comprehensive metabolic panel     Status: Abnormal   Collection Time: 03/31/20 11:33 AM  Result Value Ref Range   Sodium 134 (L) 135 - 145 mmol/L   Potassium 4.1 3.5 - 5.1 mmol/L   Chloride 103 98 - 111 mmol/L   CO2 21 (L) 22 - 32 mmol/L   Glucose, Bld 74 70 - 99 mg/dL   BUN 9 6 - 20 mg/dL   Creatinine, Ser 0.52 0.44 - 1.00 mg/dL   Calcium 8.8 (L) 8.9 - 10.3 mg/dL   Total Protein 6.1 (L) 6.5 - 8.1 g/dL   Albumin 3.2 (L) 3.5 - 5.0 g/dL   AST 24 15 - 41 U/L   ALT 30 0 - 44 U/L   Alkaline Phosphatase 48 38 - 126 U/L   Total Bilirubin 0.6 0.3 - 1.2 mg/dL   GFR calc non Af  Amer >60 >60 mL/min   GFR calc Af Amer >60 >60 mL/min   Anion gap 10 5 - 15     Assessment and Plan   Hyperemesis gravidum   - d/c home in stable condition  - continue home zofran 4mg  Q8H - continue scopolamine patch 1.5mg  change every 3 days  - start phenergran suppositories at bedtime  - start phenergan infusions 2 x a week  - return precautions given  - gut rest    Bartholome Bill 03/31/2020, 2:30 PM   CNM attestation:  I have seen and examined this patient and agree with above documentation in the  Medical student's note.   Leah [name] is a 24 y.o. G2P1001 at [redacted]w[redacted]d reporting nausea and vomiting denies LOF, VB, contractions, vaginal discharge.  PE: No data found. Gen: calm comfortable, NAD Resp: normal effort, no distress Heart: Regular rate Abd: Soft, NT, gravid, S=D  ROS, labs, PMH reviewed  Orders Placed This Encounter  Procedures  . Urinalysis, Routine w reflex microscopic Urine, Clean Catch  . CBC  . Comprehensive metabolic panel  . Rapid urine drug screen (hospital performed)  .  Discharge patient Discharge disposition: 01-Home or Self Care; Discharge patient date: 03/31/2020   Meds ordered this encounter  Medications  . lactated ringers bolus 1,000 mL  . promethazine (PHENERGAN) injection 25 mg  . ondansetron (ZOFRAN-ODT) disintegrating tablet 8 mg  . lactated ringers bolus 1,000 mL  . methylPREDNISolone sodium succinate (SOLU-MEDROL) 125 mg/2 mL injection 48 mg  . promethazine (PHENERGAN) 25 MG suppository    Sig: Please place vaginally    Dispense:  24 each    Refill:  0    Order Specific Question:   Supervising Provider    Answer:   Donnamae Jude [[id number]]  . ondansetron (ZOFRAN ODT) 8 MG disintegrating tablet    Sig: Take 1 tablet (8 mg total) by mouth every 8 (eight) hours as needed for nausea or vomiting.    Dispense:  40 tablet    Refill:  0    Order Specific Question:   Supervising Provider    Answer:   Donnamae Jude [[id number]]      Assessment and Plan 1. Morning sickness    -Patient received anti-emetics as above; patient had 3 episodes of vomiting while in MAU, however, continued to feel nauseated during visit.  -Discussed case with Dr. Harolyn Rutherford, given that patient has stable electroltyes, no significant weight loss (only 1-2 lbs in a week), patient does not meet criteria for in-patient admission. Will discharge home with 2 days of gut rest, BRAT diet instructions, start phenergan suppositories at night, dissolvable zofran during the day, scop patch and start phenergan infusions next Friday (earliest appt was 1 pm on Friday).   Starr Lake, CNM 04/01/2020 9:01 AM

## 2020-04-03 ENCOUNTER — Other Ambulatory Visit: Payer: Self-pay

## 2020-04-03 ENCOUNTER — Inpatient Hospital Stay (HOSPITAL_COMMUNITY)
Admission: AD | Admit: 2020-04-03 | Discharge: 2020-04-03 | Disposition: A | Discharge status: Home / Self Care | Payer: Medicaid Other | Attending: Obstetrics & Gynecology | Admitting: Obstetrics & Gynecology

## 2020-04-03 ENCOUNTER — Encounter (HOSPITAL_COMMUNITY): Payer: Self-pay

## 2020-04-03 DIAGNOSIS — Z881 Allergy status to other antibiotic agents status: Secondary | ICD-10-CM | POA: Insufficient documentation

## 2020-04-03 DIAGNOSIS — Z8619 Personal history of other infectious and parasitic diseases: Secondary | ICD-10-CM | POA: Diagnosis not present

## 2020-04-03 DIAGNOSIS — R109 Unspecified abdominal pain: Secondary | ICD-10-CM

## 2020-04-03 DIAGNOSIS — Z3A08 8 weeks gestation of pregnancy: Secondary | ICD-10-CM | POA: Diagnosis not present

## 2020-04-03 DIAGNOSIS — O211 Hyperemesis gravidarum with metabolic disturbance: Secondary | ICD-10-CM | POA: Diagnosis not present

## 2020-04-03 DIAGNOSIS — O210 Mild hyperemesis gravidarum: Secondary | ICD-10-CM

## 2020-04-03 DIAGNOSIS — Z8669 Personal history of other diseases of the nervous system and sense organs: Secondary | ICD-10-CM | POA: Insufficient documentation

## 2020-04-03 DIAGNOSIS — O26891 Other specified pregnancy related conditions, first trimester: Secondary | ICD-10-CM | POA: Diagnosis not present

## 2020-04-03 DIAGNOSIS — Z9049 Acquired absence of other specified parts of digestive tract: Secondary | ICD-10-CM | POA: Diagnosis not present

## 2020-04-03 DIAGNOSIS — E860 Dehydration: Secondary | ICD-10-CM | POA: Diagnosis not present

## 2020-04-03 LAB — URINALYSIS, ROUTINE W REFLEX MICROSCOPIC
Bilirubin Urine: NEGATIVE
Glucose, UA: NEGATIVE mg/dL
Hgb urine dipstick: NEGATIVE
Ketones, ur: 20 mg/dL — AB
Leukocytes,Ua: NEGATIVE
Nitrite: NEGATIVE
Protein, ur: 30 mg/dL — AB
Specific Gravity, Urine: 1.028 (ref 1.005–1.030)
pH: 5 (ref 5.0–8.0)

## 2020-04-03 LAB — CBC WITH DIFFERENTIAL/PLATELET
Abs Immature Granulocytes: 0.02 10*3/uL (ref 0.00–0.07)
Basophils Absolute: 0 10*3/uL (ref 0.0–0.1)
Basophils Relative: 0 %
Eosinophils Absolute: 0.1 10*3/uL (ref 0.0–0.5)
Eosinophils Relative: 1 %
HCT: 35.2 % — ABNORMAL LOW (ref 36.0–46.0)
Hemoglobin: 11.9 g/dL — ABNORMAL LOW (ref 12.0–15.0)
Immature Granulocytes: 0 %
Lymphocytes Relative: 22 %
Lymphs Abs: 1.9 10*3/uL (ref 0.7–4.0)
MCH: 30.4 pg (ref 26.0–34.0)
MCHC: 33.8 g/dL (ref 30.0–36.0)
MCV: 89.8 fL (ref 80.0–100.0)
Monocytes Absolute: 0.6 10*3/uL (ref 0.1–1.0)
Monocytes Relative: 7 %
Neutro Abs: 6.1 10*3/uL (ref 1.7–7.7)
Neutrophils Relative %: 70 %
Platelets: 266 10*3/uL (ref 150–400)
RBC: 3.92 MIL/uL (ref 3.87–5.11)
RDW: 12.4 % (ref 11.5–15.5)
WBC: 8.7 10*3/uL (ref 4.0–10.5)
nRBC: 0 % (ref 0.0–0.2)

## 2020-04-03 LAB — COMPREHENSIVE METABOLIC PANEL
ALT: 38 U/L (ref 0–44)
AST: 22 U/L (ref 15–41)
Albumin: 3.4 g/dL — ABNORMAL LOW (ref 3.5–5.0)
Alkaline Phosphatase: 48 U/L (ref 38–126)
Anion gap: 8 (ref 5–15)
BUN: 8 mg/dL (ref 6–20)
CO2: 23 mmol/L (ref 22–32)
Calcium: 9 mg/dL (ref 8.9–10.3)
Chloride: 105 mmol/L (ref 98–111)
Creatinine, Ser: 0.59 mg/dL (ref 0.44–1.00)
GFR calc Af Amer: >60 mL/min (ref >60)
GFR calc non Af Amer: >60 mL/min (ref >60)
Glucose, Bld: 94 mg/dL (ref 70–99)
Potassium: 3.5 mmol/L (ref 3.5–5.1)
Sodium: 136 mmol/L (ref 135–145)
Total Bilirubin: 0.4 mg/dL (ref 0.3–1.2)
Total Protein: 6.6 g/dL (ref 6.5–8.1)

## 2020-04-03 LAB — RAPID URINE DRUG SCREEN, HOSP PERFORMED
Amphetamines: NOT DETECTED
Barbiturates: NOT DETECTED
Benzodiazepines: NOT DETECTED
Cocaine: NOT DETECTED
Opiates: NOT DETECTED
Tetrahydrocannabinol: NOT DETECTED

## 2020-04-03 MED ORDER — SODIUM CHLORIDE 0.9 % IV SOLN
25.0000 mg | Freq: Once | INTRAVENOUS | Status: DC
  Filled 2020-04-03: qty 1

## 2020-04-03 MED ORDER — LACTATED RINGERS IV BOLUS
1000.0000 mL | Freq: Once | INTRAVENOUS | Status: AC
  Administered 2020-04-03: 1000 mL via INTRAVENOUS

## 2020-04-03 MED ORDER — PROMETHAZINE HCL 25 MG/ML IJ SOLN
25.0000 mg | Freq: Once | INTRAMUSCULAR | Status: AC
  Administered 2020-04-03: 25 mg via INTRAVENOUS
  Filled 2020-04-03: qty 1

## 2020-04-03 MED ORDER — FAMOTIDINE IN NACL 20-0.9 MG/50ML-% IV SOLN
20.0000 mg | Freq: Once | INTRAVENOUS | Status: AC
  Administered 2020-04-03: 20 mg via INTRAVENOUS
  Filled 2020-04-03: qty 50

## 2020-04-03 NOTE — MAU Note (Signed)
Presents with abdominal pain since about 0600, as well as nausea and vomiting for several weeks.

## 2020-04-03 NOTE — MAU Note (Signed)
Still can't keep nothing down and her stomach hurts. Pain in RLQ.

## 2020-04-03 NOTE — MAU Provider Note (Signed)
History     CSN: [id number]  Arrival date and time: 04/03/20 1221   First Provider Initiated Contact with Patient 04/03/20 1454      Chief Complaint  Patient presents with  . Abdominal Pain  . Emesis   HPI  [name] is a 24 y.o. female G2P1001 here with nausea and vomiting. She reports vomiting 4 times this morning. The last time she vomited was 11:00. Reports she has not been able to keep anything down today. She reports some lower abdominal pain that comes and goes. The pain is in her lower abdomen. The pain is mild. She has not taken anything for the pain. She reports taking pepcid, Phenergan suppository last dose was today and Zofran last dose was today.   OB History    Gravida  2   Para  1   Term  1   Preterm  0   AB  0   Living  1     SAB  0   TAB  0   Ectopic  0   Multiple      Live Births  1           Past Medical History:  Diagnosis Date  . Anemia 2013  . Anxiety 2011   no meds during pregnancy  . Bipolar 1 disorder (HCC)   . Chlamydia   . Gall stones   . GERD (gastroesophageal reflux disease)   . 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 Seizure [facility]) 2011   pseudoseizures    Past Surgical History:  Procedure Laterality Date  . CHOLECYSTECTOMY N/A 09/28/2014   Procedure: LAPAROSCOPIC CHOLECYSTECTOMY;  Surgeon: Axel Filler, MD;  Location: [facility] OR;  Service: General;  Laterality: N/A;  . CHOLECYSTECTOMY    . DIRECT LARYNGOSCOPY N/A 04/24/2014   Procedure: DIRECT LARYNGOSCOPY;  Surgeon: [name], MD;  Location: [facility] OR;  Service: ENT;  Laterality: N/A;  . FOREIGN BODY REMOVAL ESOPHAGEAL N/A 04/24/2014   Procedure: REMOVAL FOREIGN BODY ESOPHAGEAL;  Surgeon: [name], MD;  Location[facility] OR;  Service: ENT;  Laterality: N/A;  . WISDOM TOOTH EXTRACTION  2013    Family History  Problem Relation Age of Onset  . Arthritis Mother   . Bronchitis Mother   . Asthma Mother   . Hearing loss Paternal Grandfather   .  Diabetes Neg Hx   . Stomach cancer Neg Hx   . Colon cancer Neg Hx     Social History   Tobacco Use  . Smoking status: Never Smoker  . Smokeless tobacco: Never Used  . Tobacco comment: marjiuna  Vaping Use  . Vaping Use: Never used  Substance Use Topics  . Alcohol use: Not Currently    Alcohol/week: 6.0 standard drinks    Types: 6 Cans of beer per week  . Drug use: Yes    Types: Marijuana    Comment: last used before pregnancy    Allergies:  Allergies  Allergen Reactions  . Bactrim [Sulfamethoxazole-Trimethoprim] Rash    Medications Prior to Admission  Medication Sig Dispense Refill Last Dose  . famotidine (PEPCID) 20 MG tablet Take 1 tablet (20 mg total) by mouth 2 (two) times daily. 30 tablet 0 04/02/2020 at [place]  . ondansetron (ZOFRAN ODT) 8 MG disintegrating tablet Take 1 tablet (8 mg total) by mouth every 8 (eight) hours as needed for nausea or vomiting. 40 tablet 0 04/02/2020 at 2000  . Prenatal Vit-Fe Fumarate-FA (PRENATAL  MULTIVITAMIN) TABS tablet Take 1 tablet by mouth daily at 12 noon.   04/03/2020 at 0600  . promethazine (PHENERGAN) 25 MG suppository Please place vaginally 24 each 0 04/03/2020 at 0700  . scopolamine (TRANSDERM-SCOP) 1 MG/3DAYS Place 1 patch (1.5 mg total) onto the skin every 3 (three) days. 1 patch 1 04/02/2020 at Unknown time  . Blood Pressure Monitoring (BLOOD PRESSURE KIT) DEVI 1 Device by Does not apply route as needed. 1 each 0    Results for orders placed or performed during the hospital encounter of 04/03/20 (from the past 72 hour(s))  Urinalysis, Routine w reflex microscopic Urine, Clean Catch     Status: Abnormal   Collection Time: 04/03/20 12:52 PM  Result Value Ref Range   Color, Urine YELLOW YELLOW   APPearance HAZY (A) CLEAR   Specific Gravity, Urine 1.028 1.005 - 1.030   pH 5.0 5.0 - 8.0   Glucose, UA NEGATIVE NEGATIVE mg/dL   Hgb urine dipstick NEGATIVE NEGATIVE   Bilirubin Urine NEGATIVE NEGATIVE   Ketones, ur 20 (A) NEGATIVE  mg/dL   Protein, ur 30 (A) NEGATIVE mg/dL   Nitrite NEGATIVE NEGATIVE   Leukocytes,Ua NEGATIVE NEGATIVE   RBC / HPF 0-5 0 - 5 RBC/hpf   WBC, UA 0-5 0 - 5 WBC/hpf   Bacteria, UA RARE (A) NONE SEEN   Squamous Epithelial / LPF 6-10 0 - 5   Mucus PRESENT     Comment: Performed at [facility], [address], [place], [place] [address]  Urine rapid drug screen (hosp performed)     Status: None   Collection Time: 04/03/20 12:58 PM  Result Value Ref Range   Opiates NONE DETECTED NONE DETECTED   Cocaine NONE DETECTED NONE DETECTED   Benzodiazepines NONE DETECTED NONE DETECTED   Amphetamines NONE DETECTED NONE DETECTED   Tetrahydrocannabinol NONE DETECTED NONE DETECTED   Barbiturates NONE DETECTED NONE DETECTED    Comment: (NOTE) DRUG SCREEN FOR MEDICAL PURPOSES ONLY.  IF CONFIRMATION IS NEEDED FOR ANY PURPOSE, NOTIFY LAB WITHIN 5 DAYS.  LOWEST DETECTABLE LIMITS FOR URINE DRUG SCREEN Drug Class                     Cutoff (ng/mL) Amphetamine and metabolites    1000 Barbiturate and metabolites    200 Benzodiazepine                 093 Tricyclics and metabolites     300 Opiates and metabolites        300 Cocaine and metabolites        300 THC                            50 Performed at [place] [facility], [place] [address] [place], [place] [address]   CBC with Differential/Platelet     Status: Abnormal   Collection Time: 04/03/20  2:31 PM  Result Value Ref Range   WBC 8.7 4.0 - 10.5 K/uL   RBC 3.92 3.87 - 5.11 MIL/uL   Hemoglobin 11.9 (L) 12.0 - 15.0 g/dL   HCT 35.2 (L) 36 - 46 %   MCV 89.8 80.0 - 100.0 fL   MCH 30.4 26.0 - 34.0 pg   MCHC 33.8 30.0 - 36.0 g/dL   RDW 12.4 11.5 - 15.5 %   Platelets 266 150 - 400 K/uL   nRBC 0.0 0.0 - 0.2 %   Neutrophils Relative % 70 %  Neutro Abs 6.1 1.7 - 7.7 K/uL   Lymphocytes Relative 22 %   Lymphs Abs 1.9 0.7 - 4.0 K/uL   Monocytes Relative 7 %   Monocytes Absolute 0.6 0 - 1 K/uL   Eosinophils Relative 1 %   Eosinophils Absolute  0.1 0 - 0 K/uL   Basophils Relative 0 %   Basophils Absolute 0.0 0 - 0 K/uL   Immature Granulocytes 0 %   Abs Immature Granulocytes 0.02 0.00 - 0.07 K/uL    Comment: Performed at [place] [address] [place], [place] [address]  Comprehensive metabolic panel     Status: Abnormal   Collection Time: 04/03/20  2:31 PM  Result Value Ref Range   Sodium 136 135 - 145 mmol/L   Potassium 3.5 3.5 - 5.1 mmol/L   Chloride 105 98 - 111 mmol/L   CO2 23 22 - 32 mmol/L   Glucose, Bld 94 70 - 99 mg/dL    Comment: Glucose reference range applies only to samples taken after fasting for at least 8 hours.   BUN 8 6 - 20 mg/dL   Creatinine, Ser 0.59 0.44 - 1.00 mg/dL   Calcium 9.0 8.9 - 10.3 mg/dL   Total Protein 6.6 6.5 - 8.1 g/dL   Albumin 3.4 (L) 3.5 - 5.0 g/dL   AST 22 15 - 41 U/L   ALT 38 0 - 44 U/L   Alkaline Phosphatase 48 38 - 126 U/L   Total Bilirubin 0.4 0.3 - 1.2 mg/dL   GFR calc non Af Amer >60 >60 mL/min   GFR calc Af Amer >60 >60 mL/min   Anion gap 8 5 - 15    Comment: Performed at [facility], [place] [address] [place], [place] [address]    Review of Systems  Constitutional: Negative for fever.  Gastrointestinal: Positive for abdominal pain, nausea and vomiting.  Genitourinary: Negative for vaginal bleeding and vaginal discharge.   Physical Exam   Blood pressure (!) 112/59, pulse 74, temperature 98.3 F (36.8 C), temperature source Oral, resp. rate 15, height 5' (1.524 m), weight 62.6 kg, last menstrual period 02/01/2020, SpO2 100 %.  Physical Exam Constitutional:      General: She is not in acute distress.    Appearance: She is well-developed. She is ill-appearing. She is not toxic-appearing.  HENT:     Head: Normocephalic.  Cardiovascular:     Rate and Rhythm: Normal rate.  Abdominal:     Palpations: Abdomen is soft.     Tenderness: There is generalized abdominal tenderness. There is no guarding or rebound.  Skin:    General: Skin is warm.   Neurological:     Mental Status: She is alert.  Psychiatric:        Mood and Affect: Mood normal.    MAU Course  Procedures   Pt informed that the ultrasound is considered a limited OB ultrasound and is not intended to be a complete ultrasound exam.  Patient also informed that the ultrasound is not being completed with the intent of assessing for fetal or placental anomalies or any pelvic abnormalities.  Explained that the purpose of today's ultrasound is to assess for  viability.  Patient acknowledges the purpose of the exam and the limitations of the study.     MDM  UA shows 20 ketones, mild dehydration. Patient with a 2 kg weight gain LR bolus x 1 Pepcid, Phenergan IV Patient tolerating oral fluids.  CMP and CBC collected.  Assessment and Plan   A:  1. Hyperemesis gravidarum   2. [redacted] weeks gestation of pregnancy   3. Abdominal pain in pregnancy, first trimester   4. Mild dehydration     P:  Discharge home in stable condition Continue home medications Small, frequent meals Return to MAU if symptoms worsen  Schyler Butikofer, Artist Pais, NP 04/03/2020 8:19 PM

## 2020-04-06 ENCOUNTER — Other Ambulatory Visit (HOSPITAL_COMMUNITY): Payer: Self-pay | Admitting: *Deleted

## 2020-04-06 ENCOUNTER — Other Ambulatory Visit: Payer: Self-pay | Admitting: Student

## 2020-04-07 ENCOUNTER — Other Ambulatory Visit: Payer: Self-pay

## 2020-04-07 ENCOUNTER — Inpatient Hospital Stay (HOSPITAL_COMMUNITY)
Admission: AD | Admit: 2020-04-07 | Discharge: 2020-04-07 | Disposition: A | Discharge status: Home / Self Care | Payer: Medicaid Other | Attending: Obstetrics & Gynecology | Admitting: Obstetrics & Gynecology

## 2020-04-07 ENCOUNTER — Encounter (HOSPITAL_COMMUNITY): Payer: Self-pay | Admitting: Obstetrics & Gynecology

## 2020-04-07 ENCOUNTER — Encounter (HOSPITAL_COMMUNITY)
Admission: RE | Admit: 2020-04-07 | Discharge: 2020-04-07 | Disposition: A | Discharge status: Home / Self Care | Payer: Medicaid Other | Attending: Student | Admitting: Student

## 2020-04-07 DIAGNOSIS — O99341 Other mental disorders complicating pregnancy, first trimester: Secondary | ICD-10-CM | POA: Diagnosis not present

## 2020-04-07 DIAGNOSIS — O219 Vomiting of pregnancy, unspecified: Secondary | ICD-10-CM | POA: Diagnosis not present

## 2020-04-07 DIAGNOSIS — Z79899 Other long term (current) drug therapy: Secondary | ICD-10-CM | POA: Diagnosis not present

## 2020-04-07 DIAGNOSIS — O210 Mild hyperemesis gravidarum: Secondary | ICD-10-CM | POA: Insufficient documentation

## 2020-04-07 DIAGNOSIS — O0990 Supervision of high risk pregnancy, unspecified, unspecified trimester: Secondary | ICD-10-CM

## 2020-04-07 DIAGNOSIS — O99611 Diseases of the digestive system complicating pregnancy, first trimester: Secondary | ICD-10-CM | POA: Insufficient documentation

## 2020-04-07 DIAGNOSIS — K219 Gastro-esophageal reflux disease without esophagitis: Secondary | ICD-10-CM | POA: Diagnosis not present

## 2020-04-07 DIAGNOSIS — Z3A09 9 weeks gestation of pregnancy: Secondary | ICD-10-CM | POA: Diagnosis not present

## 2020-04-07 DIAGNOSIS — F319 Bipolar disorder, unspecified: Secondary | ICD-10-CM | POA: Diagnosis not present

## 2020-04-07 DIAGNOSIS — R569 Unspecified convulsions: Secondary | ICD-10-CM

## 2020-04-07 LAB — URINALYSIS, ROUTINE W REFLEX MICROSCOPIC
Bilirubin Urine: NEGATIVE
Glucose, UA: NEGATIVE mg/dL
Hgb urine dipstick: NEGATIVE
Ketones, ur: NEGATIVE mg/dL
Leukocytes,Ua: NEGATIVE
Nitrite: NEGATIVE
Protein, ur: 30 mg/dL — AB
Specific Gravity, Urine: 1.029 (ref 1.005–1.030)
pH: 6 (ref 5.0–8.0)

## 2020-04-07 MED ORDER — PROCHLORPERAZINE MALEATE 10 MG PO TABS: 10.0000 mg | ORAL_TABLET | Freq: Four times a day (QID) | ORAL | 1 refills | Status: DC | PRN

## 2020-04-07 MED ORDER — LACTATED RINGERS IV BOLUS
1000.0000 mL | Freq: Once | INTRAVENOUS | Status: AC
  Administered 2020-04-07: 1000 mL via INTRAVENOUS

## 2020-04-07 MED ORDER — FAMOTIDINE IN NACL 20-0.9 MG/50ML-% IV SOLN
20.0000 mg | Freq: Once | INTRAVENOUS | Status: AC
  Administered 2020-04-07: 20 mg via INTRAVENOUS
  Filled 2020-04-07: qty 50

## 2020-04-07 MED ORDER — SCOPOLAMINE 1 MG/3DAYS TD PT72
1.0000 | MEDICATED_PATCH | TRANSDERMAL | Status: DC
  Administered 2020-04-07: 1.5 mg via TRANSDERMAL
  Filled 2020-04-07: qty 1

## 2020-04-07 MED ORDER — PROCHLORPERAZINE EDISYLATE 10 MG/2ML IJ SOLN
10.0000 mg | Freq: Once | INTRAMUSCULAR | Status: AC
  Administered 2020-04-07: 10 mg via INTRAVENOUS
  Filled 2020-04-07: qty 2

## 2020-04-07 MED ORDER — GLYCOPYRROLATE 0.2 MG/ML IJ SOLN
0.1000 mg | Freq: Once | INTRAMUSCULAR | Status: AC
  Administered 2020-04-07: 0.1 mg via INTRAVENOUS
  Filled 2020-04-07: qty 1

## 2020-04-07 MED ORDER — SCOPOLAMINE 1 MG/3DAYS TD PT72
1.0000 | MEDICATED_PATCH | TRANSDERMAL | 2 refills | Status: DC
Start: 2020-04-07 — End: 2020-06-28

## 2020-04-07 NOTE — Discharge Instructions (Signed)
Hyperemesis Gravidarum Hyperemesis gravidarum is a severe form of nausea and vomiting that happens during pregnancy. Hyperemesis is worse than morning sickness. It may cause you to have nausea or vomiting all day for many days. It may keep you from eating and drinking enough food and liquids, which can lead to dehydration, malnutrition, and weight loss. Hyperemesis usually occurs during the first half (the first 20 weeks) of pregnancy. It often goes away once a woman is in her second half of pregnancy. However, sometimes hyperemesis continues through an entire pregnancy. What are the causes? The cause of this condition is not known. It may be related to changes in chemicals (hormones) in the body during pregnancy, such as the high level of pregnancy hormone (human chorionic gonadotropin) or the increase in the female sex hormone (estrogen). What are the signs or symptoms? Symptoms of this condition include:  Nausea that does not go away.  Vomiting that does not allow you to keep any food down.  Weight loss.  Body fluid loss (dehydration).  Having no desire to eat, or not liking food that you have previously enjoyed. How is this diagnosed? This condition may be diagnosed based on:  A physical exam.  Your medical history.  Your symptoms.  Blood tests.  Urine tests. How is this treated? This condition is managed by controlling symptoms. This may include:  Following an eating plan. This can help lessen nausea and vomiting.  Taking prescription medicines. An eating plan and medicines are often used together to help control symptoms. If medicines do not help relieve nausea and vomiting, you may need to receive fluids through an IV at the hospital. Follow these instructions at home: Eating and drinking   Avoid the following: ? Drinking fluids with meals. Try not to drink anything during the 30 minutes before and after your meals. ? Drinking more than 1 cup of fluid at a  time. ? Eating foods that trigger your symptoms. These may include spicy foods, coffee, high-fat foods, very sweet foods, and acidic foods. ? Skipping meals. Nausea can be more intense on an empty stomach. If you cannot tolerate food, do not force it. Try sucking on ice chips or other frozen items and make up for missed calories later. ? Lying down within 2 hours after eating. ? Being exposed to environmental triggers. These may include food smells, smoky rooms, closed spaces, rooms with strong smells, warm or humid places, overly loud and noisy rooms, and rooms with motion or flickering lights. Try eating meals in a well-ventilated area that is free of strong smells. ? Quick and sudden changes in your movement. ? Taking iron pills and multivitamins that contain iron. If you take prescription iron pills, do not stop taking them unless your health care provider approves. ? Preparing food. The smell of food can spoil your appetite or trigger nausea.  To help relieve your symptoms: ? Listen to your body. Everyone is different and has different preferences. Find what works best for you. ? Eat and drink slowly. ? Eat 5-6 small meals daily instead of 3 large meals. Eating small meals and snacks can help you avoid an empty stomach. ? In the morning, before getting out of bed, eat a couple of crackers to avoid moving around on an empty stomach. ? Try eating starchy foods as these are usually tolerated well. Examples include cereal, toast, bread, potatoes, pasta, rice, and pretzels. ? Include at least 1 serving of protein with your meals and snacks. Protein options include   lean meats, poultry, seafood, beans, nuts, nut butters, eggs, cheese, and yogurt. ? Try eating a protein-rich snack before bed. Examples of a protein-rick snack include cheese and crackers or a peanut butter sandwich made with 1 slice of whole-wheat bread and 1 tsp (5 g) of peanut butter. ? Eat or suck on things that have ginger in them.  It may help relieve nausea. Add  tsp ground ginger to hot tea or choose ginger tea. ? Try drinking 100% fruit juice or an electrolyte drink. An electrolyte drink contains sodium, potassium, and chloride. ? Drink fluids that are cold, clear, and carbonated or sour. Examples include lemonade, ginger ale, lemon-lime soda, ice water, and sparkling water. ? Brush your teeth or use a mouth rinse after meals. ? Talk with your health care provider about starting a supplement of vitamin B6. General instructions  Take over-the-counter and prescription medicines only as told by your health care provider.  Follow instructions from your health care provider about eating or drinking restrictions.  Continue to take your prenatal vitamins as told by your health care provider. If you are having trouble taking your prenatal vitamins, talk with your health care provider about different options.  Keep all follow-up and pre-birth (prenatal) visits as told by your health care provider. This is important. Contact a health care provider if:  You have pain in your abdomen.  You have a severe headache.  You have vision problems.  You are losing weight.  You feel weak or dizzy. Get help right away if:  You cannot drink fluids without vomiting.  You vomit blood.  You have constant nausea and vomiting.  You are very weak.  You faint.  You have a fever and your symptoms suddenly get worse. Summary  Hyperemesis gravidarum is a severe form of nausea and vomiting that happens during pregnancy.  Making some changes to your eating habits may help relieve nausea and vomiting.  This condition may be managed with medicine.  If medicines do not help relieve nausea and vomiting, you may need to receive fluids through an IV at the hospital. This information is not intended to replace advice given to you by your health care provider. Make sure you discuss any questions you have with your health care  provider. Document Revised: 07/28/2017 Document Reviewed: 03/06/2016 Elsevier Patient Education  2020 Elsevier Inc.  

## 2020-04-07 NOTE — Progress Notes (Signed)
Patient arrived 50 minutes late for her 1:00 appointment.  Rescheduled patient for Next week as our department policy is we do not see patients after they are more then 30 minutes late.  Pr verbalized understanding.

## 2020-04-07 NOTE — MAU Provider Note (Signed)
History     CSN: [id number]  Arrival date and time: 04/07/20 1356   First Provider Initiated Contact with Patient 04/07/20 1450      Chief Complaint  Patient presents with  . Nausea  . Emesis   24 y.o. G2P1001 @[redacted]w[redacted]d  with known HEG presenting after missing appt at infusion center today. Reports emesis 10 times a day. States cannot tolerate po food or fluids. Denies VB or abd pain. States has tried Zofran, Phenergan, Reglan, and Diclegis and nothing works. Reports 5 lbs weight loss in the last week.   OB History    Gravida  2   Para  1   Term  1   Preterm  0   AB  0   Living  1     SAB  0   TAB  0   Ectopic  0   Multiple      Live Births  1           Past Medical History:  Diagnosis Date  . Anemia 2013  . Bipolar 1 disorder ([place])   . Gall stones   . GERD (gastroesophageal reflux disease)   . Seizure [facility]) 2011   pseudoseizures    Past Surgical History:  Procedure Laterality Date  . CHOLECYSTECTOMY N/A 09/28/2014   Procedure: LAPAROSCOPIC CHOLECYSTECTOMY;  Surgeon: Ralene Ok, MD;  Location: [place];  Service: General;  Laterality: N/A;  . CHOLECYSTECTOMY    . DIRECT LARYNGOSCOPY N/A 04/24/2014   Procedure: DIRECT LARYNGOSCOPY;  Surgeon: Ascencion Dike, MD;  Location: [place];  Service: ENT;  Laterality: N/A;  . FOREIGN BODY REMOVAL ESOPHAGEAL N/A 04/24/2014   Procedure: REMOVAL FOREIGN BODY ESOPHAGEAL;  Surgeon: Ascencion Dike, MD;  Location: [facility] OR;  Service: ENT;  Laterality: N/A;  . WISDOM TOOTH EXTRACTION  2013    Family History  Problem Relation Age of Onset  . Arthritis Mother   . Bronchitis Mother   . Asthma Mother   . Hearing loss Paternal Grandfather   . Diabetes Neg Hx   . Stomach cancer Neg Hx   . Colon cancer Neg Hx     Social History   Tobacco Use  . Smoking status: Never Smoker  . Smokeless tobacco: Never Used  . Tobacco comment: marjiuna  Vaping Use  . Vaping Use: Never used  Substance Use Topics  . Alcohol use: Not Currently     Alcohol/week: 6.0 standard drinks    Types: 6 Cans of beer per week  . Drug use: Yes    Types: Marijuana    Comment: last used before pregnancy    Allergies:  Allergies  Allergen Reactions  . Bactrim [Sulfamethoxazole-Trimethoprim] Rash    Medications Prior to Admission  Medication Sig Dispense Refill Last Dose  . Blood Pressure Monitoring (BLOOD PRESSURE KIT) DEVI 1 Device by Does not apply route as needed. 1 each 0   . famotidine (PEPCID) 20 MG tablet Take 1 tablet (20 mg total) by mouth 2 (two) times daily. 30 tablet 0   . ondansetron (ZOFRAN ODT) 8 MG disintegrating tablet Take 1 tablet (8 mg total) by mouth every 8 (eight) hours as needed for nausea or vomiting. 40 tablet 0   . Prenatal Vit-Fe Fumarate-FA (PRENATAL MULTIVITAMIN) TABS tablet Take 1 tablet by mouth daily at 12 noon.     . promethazine (PHENERGAN) 25 MG suppository Please place vaginally 24 each 0   . scopolamine (TRANSDERM-SCOP) 1 MG/3DAYS Place 1 patch (1.5 mg total) onto the skin  every 3 (three) days. 1 patch 1     Review of Systems  Gastrointestinal: Positive for constipation, nausea and vomiting.  Genitourinary: Negative for vaginal bleeding.   Physical Exam   Blood pressure 100/60, pulse 84, temperature 98.4 F (36.9 C), temperature source Oral, resp. rate 20, height 5' (1.524 m), weight 60.3 kg, last menstrual period 02/01/2020, SpO2 99 %.  Physical Exam Vitals and nursing note reviewed.  Constitutional:      Appearance: Normal appearance.  HENT:     Head: Normocephalic.  Cardiovascular:     Rate and Rhythm: Normal rate.  Pulmonary:     Effort: Pulmonary effort is normal. No respiratory distress.  Musculoskeletal:        General: Normal range of motion.  Neurological:     General: No focal deficit present.     Mental Status: She is alert and oriented to person, place, and time.  Psychiatric:        Mood and Affect: Mood normal.        Behavior: Behavior normal.    Results for orders  placed or performed during the hospital encounter of 04/07/20 (from the past 24 hour(s))  Urinalysis, Routine w reflex microscopic Urine, Clean Catch     Status: Abnormal   Collection Time: 04/07/20  2:31 PM  Result Value Ref Range   Color, Urine YELLOW YELLOW   APPearance HAZY (A) CLEAR   Specific Gravity, Urine 1.029 1.005 - 1.030   pH 6.0 5.0 - 8.0   Glucose, UA NEGATIVE NEGATIVE mg/dL   Hgb urine dipstick NEGATIVE NEGATIVE   Bilirubin Urine NEGATIVE NEGATIVE   Ketones, ur NEGATIVE NEGATIVE mg/dL   Protein, ur 30 (A) NEGATIVE mg/dL   Nitrite NEGATIVE NEGATIVE   Leukocytes,Ua NEGATIVE NEGATIVE   RBC / HPF 0-5 0 - 5 RBC/hpf   WBC, UA 0-5 0 - 5 WBC/hpf   Bacteria, UA RARE (A) NONE SEEN   Squamous Epithelial / LPF 6-10 0 - 5   Mucus PRESENT    MAU Course  Procedures LR Compazine Scopolamine Pepcid Robinul  MDM Labs ordered and reviewed. Was late for her Phenergan infusion appt today and was rescheduled. Total weigh loss is 12.1 lbs. Mild dehydration on UA. No emesis while here. Tolerating small amt of po. Stable for discharge home.   Assessment and Plan  [redacted] weeks gestation HEG Discharge home Rx Scopolamine Rx Compazine Follow up at [facility] as scheduled Return precautions  Allergies as of 04/07/2020      Reactions   Bactrim [sulfamethoxazole-trimethoprim] Rash      Medication List    TAKE these medications   Blood Pressure Kit Devi 1 Device by Does not apply route as needed.   famotidine 20 MG tablet Commonly known as: PEPCID Take 1 tablet (20 mg total) by mouth 2 (two) times daily.   ondansetron 8 MG disintegrating tablet Commonly known as: Zofran ODT Take 1 tablet (8 mg total) by mouth every 8 (eight) hours as needed for nausea or vomiting.   prenatal multivitamin Tabs tablet Take 1 tablet by mouth daily at 12 noon.   prochlorperazine 10 MG tablet Commonly known as: COMPAZINE Take 1 tablet (10 mg total) by mouth every 6 (six) hours as needed for nausea or  vomiting.   promethazine 25 MG suppository Commonly known as: PHENERGAN Please place vaginally   scopolamine 1 MG/3DAYS Commonly known as: TRANSDERM-SCOP Place 1 patch (1.5 mg total) onto the skin every 3 (three) days.  Julianne Handler, CNM 04/07/2020, 2:57 PM

## 2020-04-07 NOTE — MAU Note (Signed)
Presents with c/o N?V, states can't keep anything down.  Reports vomited 10x in past 24 hours.  States last took Phenergan @ 0700 this morning & Zofran @ 1100 this morning. Missed IV infusion appt sched @ 1300 today, arrived 50 minutes late.  Appt rescheduled.

## 2020-04-10 ENCOUNTER — Encounter (HOSPITAL_COMMUNITY)
Admission: RE | Admit: 2020-04-10 | Discharge: 2020-04-10 | Disposition: A | Discharge status: Home / Self Care | Payer: Medicaid Other | Source: Ambulatory Visit | Attending: Student | Admitting: Student

## 2020-04-10 DIAGNOSIS — O210 Mild hyperemesis gravidarum: Secondary | ICD-10-CM | POA: Diagnosis not present

## 2020-04-10 MED ORDER — SODIUM CHLORIDE 0.9 % IV SOLN: INTRAVENOUS | 7 refills | Status: DC

## 2020-04-10 MED ORDER — SODIUM CHLORIDE 0.9 % IV SOLN
25.0000 mg | INTRAVENOUS | Status: DC
  Administered 2020-04-10: 25 mg via INTRAVENOUS
  Filled 2020-04-10: qty 1

## 2020-04-11 ENCOUNTER — Other Ambulatory Visit (HOSPITAL_COMMUNITY)
Admission: RE | Admit: 2020-04-11 | Discharge: 2020-04-11 | Disposition: A | Discharge status: Home / Self Care | Payer: Medicaid Other | Source: Ambulatory Visit | Attending: Family Medicine | Admitting: Family Medicine

## 2020-04-11 ENCOUNTER — Encounter: Payer: Self-pay | Admitting: Family Medicine

## 2020-04-11 ENCOUNTER — Other Ambulatory Visit: Payer: Self-pay

## 2020-04-11 ENCOUNTER — Ambulatory Visit (INDEPENDENT_AMBULATORY_CARE_PROVIDER_SITE_OTHER): Payer: Medicaid Other | Admitting: Family Medicine

## 2020-04-11 VITALS — BP 107/67 | HR 77 | Wt 133.7 lb

## 2020-04-11 DIAGNOSIS — O09891 Supervision of other high risk pregnancies, first trimester: Secondary | ICD-10-CM | POA: Diagnosis not present

## 2020-04-11 DIAGNOSIS — F332 Major depressive disorder, recurrent severe without psychotic features: Secondary | ICD-10-CM

## 2020-04-11 DIAGNOSIS — T50902D Poisoning by unspecified drugs, medicaments and biological substances, intentional self-harm, subsequent encounter: Secondary | ICD-10-CM

## 2020-04-11 DIAGNOSIS — F319 Bipolar disorder, unspecified: Secondary | ICD-10-CM

## 2020-04-11 DIAGNOSIS — R569 Unspecified convulsions: Secondary | ICD-10-CM

## 2020-04-11 DIAGNOSIS — O219 Vomiting of pregnancy, unspecified: Secondary | ICD-10-CM

## 2020-04-11 DIAGNOSIS — F339 Major depressive disorder, recurrent, unspecified: Secondary | ICD-10-CM

## 2020-04-11 DIAGNOSIS — O0990 Supervision of high risk pregnancy, unspecified, unspecified trimester: Secondary | ICD-10-CM | POA: Insufficient documentation

## 2020-04-11 DIAGNOSIS — D509 Iron deficiency anemia, unspecified: Secondary | ICD-10-CM

## 2020-04-11 NOTE — Progress Notes (Signed)
History:   Leah [name] is a 24 y.o. G2P1001 at 63w0dby LMP being seen today for her first obstetrical visit.  Her obstetrical history is uncomplicated, however, she does have a significant medical history for bipolar 1 disorder (followed by psych/therapist, zyprexa discontinued by patient, history of 6 hospitalizations), depression, seizure in prior pregnancy (not on meds, patient reported), hyperemesis gravidarum, history of suicide attempt. Patient does intend to breast feed. Pregnancy history fully reviewed.  Patient reports nausea. Patient has had multiple MAU visits for nausea. She is using scopalamine patch, phenergan suppositories, pepcid, zofran. She states she is unable to tolerate any foods or liquids and vomits multiple times daily. She denies THC use. NBNB emesis. No diarrhea or constipation. No history of hyperemesis with prior pregnancy. No fever/chills. No sick contacts.       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   Past Surgical History:  Procedure Laterality Date   CHOLECYSTECTOMY N/A 09/28/2014   Procedure: LAPAROSCOPIC CHOLECYSTECTOMY;  Surgeon: ArRalene OkMD;  [facility] Service: General;  Laterality: N/A;   CHOLECYSTECTOMY     DIRECT LARYNGOSCOPY N/A 04/24/2014   Procedure: DIRECT LARYNGOSCOPY;  Surgeon: SuAscencion DikeMD;  [facility];  Service: ENT;  Laterality: N/A;   FOREIGN BODY REMOVAL ESOPHAGEAL  N/A 04/24/2014   Procedure: REMOVAL FOREIGN BODY ESOPHAGEAL;  Surgeon: SuAscencion DikeMD;  Location: [facility];  Service: ENT;  Laterality: N/A;   WISDOM TOOTH EXTRACTION  2013   Family History  Problem Relation Age of Onset   Arthritis Mother    Bronchitis Mother    Asthma Mother    Hearing loss Paternal Grandfather    Diabetes Neg Hx    Stomach cancer Neg Hx    Colon cancer Neg Hx    Social History   Tobacco Use   Smoking status: Never Smoker   Smokeless tobacco: Never Used   Tobacco comment: marjiuna  Vaping Use   Vaping Use: Never used  Substance Use Topics   Alcohol use: Not Currently    Alcohol/week: 6.0 standard drinks    Types: 6 Cans of beer per week   Drug use: Yes    Types: Marijuana    Comment: last used before pregnancy   Allergies  Allergen Reactions   Bactrim [Sulfamethoxazole-Trimethoprim] Rash   Current Outpatient Medications on File Prior to Visit  Medication Sig Dispense Refill   Blood Pressure Monitoring (BLOOD PRESSURE KIT) DEVI 1 Device by Does not apply route as needed. 1 each 0   famotidine (PEPCID) 20 MG tablet Take 1 tablet (20 mg total) by mouth 2 (two) times daily. 30 tablet 0  ondansetron (ZOFRAN ODT) 8 MG disintegrating tablet Take 1 tablet (8 mg total) by mouth every 8 (eight) hours as needed for nausea or vomiting. 40 tablet 0   Prenatal Vit-Fe Fumarate-FA (PRENATAL MULTIVITAMIN) TABS tablet Take 1 tablet by mouth daily at 12 noon.     prochlorperazine (COMPAZINE) 10 MG tablet Take 1 tablet (10 mg total) by mouth every 6 (six) hours as needed for nausea or vomiting. 30 tablet 1   promethazine (PHENERGAN) 25 MG suppository Please place vaginally 24 each 0   promethazine in sodium chloride 0.9 % 1,000 mL Please infuse 1 L NS with 25 mg of phenergan added and infused in over two hours IV. Please repeat twice weekly for four weeks. 1 Bag 7   scopolamine (TRANSDERM-SCOP) 1 MG/3DAYS Place 1 patch (1.5 mg total) onto the skin every 3  (three) days. 10 patch 2   No current facility-administered medications on file prior to visit.    Review of Systems Pertinent items noted in HPI and remainder of comprehensive ROS otherwise negative. Physical Exam:   Vitals:   04/11/20 0916  BP: 107/67  Pulse: 77  Weight: 133 lb 11.2 oz (60.6 kg)   Fetal Heart Rate (bpm): 165  Uterus:                             System: General: well-developed, well-nourished female in no acute distress   Breasts:  normal appearance, no masses or tenderness bilaterally   Skin: normal coloration and turgor, no rashes   Neurologic: oriented, normal, negative, normal mood   Extremities: normal strength, tone, and muscle mass, ROM of all joints is normal   HEENT PERRLA, extraocular movement intact and sclera clear, anicteric   Mouth/Teeth mucous membranes moist, pharynx normal without lesions and dental hygiene good   Neck supple and no masses   Cardiovascular: regular rate and rhythm   Respiratory:  no respiratory distress, normal breath sounds   Abdomen: soft, non-tender; bowel sounds normal; no masses,  no organomegaly    Assessment:    Pregnancy: G2P1001 Patient Active Problem List   Diagnosis Date Noted   Supervision of high risk pregnancy, antepartum 03/28/2020   Bipolar 1 disorder (HCC)    GERD (gastroesophageal reflux disease)    Morning sickness 03/20/2020   Severe recurrent major depression without psychotic features ([place]) 12/31/2018   S/P laparoscopic cholecystectomy 10/22/2017   Major depressive disorder, recurrent severe without psychotic features ([place]) 08/19/2016   Suicide attempt by drug ingestion ([place])    Biliary colic 22/08/5425   Depression 03/16/2013   Allergic rhinitis, seasonal 01/26/2013   Anemia, iron deficiency 12/01/2012   Seizure ([place]) 2011     Plan:    Supervision of high risk pregnancy, antepartum -Taking PNV, uncomplicated prior pregnancy - CBC/D/Plt+RPR+Rh+ABO+Rub Ab... - Culture, OB  Urine - Genetic Screening - Hemoglobin A1c - Cytology - PAP( [place])  Nausea and vomiting during pregnancy -8kg weight loss in the past month -has multiple anti-emetic prescriptions -will schedule for RN weight check in 2 weeks -encouraged to schedule meds and eat small frequent meals  Iron deficiency anemia, unspecified iron deficiency anemia type -Reports iron def anemia and iron supplementation in prior pregnancy -Will check cbc with initial prenatal labs  Episode of recurrent major depressive disorder, unspecified depression episode severity ([place]) Major depressive disorder, recurrent severe without psychotic features ([place]) Suicide attempt by drug ingestion, subsequent encounter Bipolar 1 disorder ([place]) -Extensive psychiatric history, with 6  prior hospitalizations -History of prior overdose ingestion/suicide attempt -Followed by psychiatry and counselor -Self discontinued zyprexa when she found out she was pregnant -Will schedule with behavioral health, likely needs anti-psychotic during pregnancy  Seizure ([place]) -Last seizure reported 2014 during prior pregnancy -Reported seizure was unwitnessed at home -never been on anti-epileptics -will continue to monitor   Initial labs drawn. Continue prenatal vitamins. Problem list reviewed and updated. Genetic Screening discussed, NIPS: ordered. Ultrasound discussed; fetal anatomic survey: scheduled. Anticipatory guidance about prenatal visits given including labs, ultrasounds, and testing. Discussed usage of Babyscripts and virtual visits as additional source of managing and completing prenatal visits in midst of coronavirus and pandemic.   Encouraged to complete MyChart Registration for her ability to review results, send requests, and have questions addressed.  The nature of [place] [facility] Healthcare/Faculty Practice with multiple MDs and Advanced Practice Providers was explained to patient; also emphasized  that residents, students are part of our team. Routine obstetric precautions reviewed. Encouraged to seek out care at office or emergency room [facility] MAU preferred) for urgent and/or emergent concerns. Return in about 2 weeks (around 04/25/2020) for RN weight check.     Arrie Senate, MD OB Fellow, [place] for [place] 04/11/2020 9:53 AM

## 2020-04-12 ENCOUNTER — Encounter: Payer: Self-pay | Admitting: *Deleted

## 2020-04-12 LAB — CBC/D/PLT+RPR+RH+ABO+RUB AB...
Antibody Screen: NEGATIVE
Basophils Absolute: 0 10*3/uL (ref 0.0–0.2)
Basos: 0 %
EOS (ABSOLUTE): 0.1 10*3/uL (ref 0.0–0.4)
Eos: 1 %
HCV Ab: 0.1 s/co ratio (ref 0.0–0.9)
HIV Screen 4th Generation wRfx: NONREACTIVE
Hematocrit: 34.8 % (ref 34.0–46.6)
Hemoglobin: 12.1 g/dL (ref 11.1–15.9)
Hepatitis B Surface Ag: NEGATIVE
Immature Grans (Abs): 0 10*3/uL (ref 0.0–0.1)
Immature Granulocytes: 0 %
Lymphocytes Absolute: 2 10*3/uL (ref 0.7–3.1)
Lymphs: 25 %
MCH: 30.9 pg (ref 26.6–33.0)
MCHC: 34.8 g/dL (ref 31.5–35.7)
MCV: 89 fL (ref 79–97)
Monocytes Absolute: 0.5 10*3/uL (ref 0.1–0.9)
Monocytes: 6 %
Neutrophils Absolute: 5.7 10*3/uL (ref 1.4–7.0)
Neutrophils: 68 %
Platelets: 234 10*3/uL (ref 150–450)
RBC: 3.92 x10E6/uL (ref 3.77–5.28)
RDW: 13.2 % (ref 11.7–15.4)
RPR Ser Ql: NONREACTIVE
Rh Factor: POSITIVE
Rubella Antibodies, IGG: 1.72 index (ref >0.99)
WBC: 8.3 10*3/uL (ref 3.4–10.8)

## 2020-04-12 LAB — HCV INTERPRETATION

## 2020-04-12 LAB — HEMOGLOBIN A1C
Est. average glucose Bld gHb Est-mCnc: 114 mg/dL
Hgb A1c MFr Bld: 5.6 % (ref 4.8–5.6)

## 2020-04-12 LAB — CERVICOVAGINAL ANCILLARY ONLY
Chlamydia: NEGATIVE
Comment: NEGATIVE
Comment: NORMAL
Neisseria Gonorrhea: NEGATIVE

## 2020-04-13 LAB — CULTURE, OB URINE

## 2020-04-13 LAB — URINE CULTURE, OB REFLEX

## 2020-04-14 ENCOUNTER — Other Ambulatory Visit: Payer: Self-pay

## 2020-04-14 ENCOUNTER — Encounter (HOSPITAL_COMMUNITY)
Admission: RE | Admit: 2020-04-14 | Discharge: 2020-04-14 | Disposition: A | Discharge status: Home / Self Care | Payer: Medicaid Other | Source: Ambulatory Visit | Attending: Student | Admitting: Student

## 2020-04-14 DIAGNOSIS — O210 Mild hyperemesis gravidarum: Secondary | ICD-10-CM | POA: Diagnosis not present

## 2020-04-14 MED ORDER — SODIUM CHLORIDE 0.9 % IV SOLN
25.0000 mg | INTRAVENOUS | Status: DC
  Administered 2020-04-14: 25 mg via INTRAVENOUS
  Filled 2020-04-14: qty 1

## 2020-04-17 ENCOUNTER — Other Ambulatory Visit: Payer: Self-pay

## 2020-04-17 ENCOUNTER — Encounter (HOSPITAL_COMMUNITY)
Admission: RE | Admit: 2020-04-17 | Discharge: 2020-04-17 | Disposition: A | Discharge status: Home / Self Care | Payer: Medicaid Other | Source: Ambulatory Visit | Attending: Student | Admitting: Student

## 2020-04-17 DIAGNOSIS — O210 Mild hyperemesis gravidarum: Secondary | ICD-10-CM | POA: Diagnosis not present

## 2020-04-17 MED ORDER — SODIUM CHLORIDE 0.9 % IV SOLN
25.0000 mg | INTRAVENOUS | Status: DC
  Administered 2020-04-17: 25 mg via INTRAVENOUS
  Filled 2020-04-17: qty 1

## 2020-04-21 ENCOUNTER — Other Ambulatory Visit: Payer: Self-pay

## 2020-04-21 ENCOUNTER — Encounter (HOSPITAL_COMMUNITY)
Admission: RE | Admit: 2020-04-21 | Discharge: 2020-04-21 | Disposition: A | Discharge status: Home / Self Care | Payer: Medicaid Other | Source: Ambulatory Visit | Attending: Student | Admitting: Student

## 2020-04-21 DIAGNOSIS — O210 Mild hyperemesis gravidarum: Secondary | ICD-10-CM | POA: Diagnosis not present

## 2020-04-21 MED ORDER — SODIUM CHLORIDE 0.9 % IV SOLN
25.0000 mg | INTRAVENOUS | Status: DC
  Administered 2020-04-21: 25 mg via INTRAVENOUS
  Filled 2020-04-21: qty 1

## 2020-04-21 NOTE — Progress Notes (Signed)
Pt reports she will not be returning for IV phenergan therapy.  Today will be her last visit.

## 2020-04-24 ENCOUNTER — Ambulatory Visit (HOSPITAL_COMMUNITY): Payer: Medicaid Other

## 2020-04-25 ENCOUNTER — Ambulatory Visit: Payer: Self-pay

## 2020-04-25 ENCOUNTER — Ambulatory Visit: Payer: Medicaid Other

## 2020-04-25 NOTE — Telephone Encounter (Signed)
Patient called stating that she has vomited 8 times since this AM.  She reports that she is [redacted] weeks pregnant and has bee Dx with Hyperemesis gravidarum. She has multiple medication for nausea on her med list.  She is light headed and dizzy. She states she is unable to keep liquid down. Per protocol patient will go to ER for evaluation of symptoms. She states she has someone to drive but not there now.  She was told to call 911 or call us back if she has problems finding transportation.  She verbalized understanding of all information.  Reason for Disposition . [1] SEVERE vomiting (e.g., 6 or more times/day) AND [2] present > 8 hours  Answer Assessment - Initial Assessment Questions 1. VOMITING SEVERITY: "How many times have you vomited in the past 24 hours?"     - MILD:  1 - 2 times/day    - MODERATE: 3 - 5 times/day, decreased oral intake without significant weight loss or symptoms of dehydration    - SEVERE: 6 or more times/day, vomits everything or nearly everything, with significant weight loss, symptoms of dehydration      8 times 2. ONSET: "When did the vomiting begin?"      This am 3. FLUIDS: "What fluids or food have you vomited up today?" "Are you able to keep any liquids down?"     no 4. TREATMENT: "What have you been doing so far to treat this?"      prochlorperacine 5. DEHYDRATION: "When was the last time you urinated?" "Are you feeling lightheaded?" "Weight loss?"    lightheaded 6. PREGNANCY: "How many weeks pregnant are you?" "How has the pregnancy been going?"     12 weeks 7. EDD: "What date are you expecting to deliver?"     April 19 8. MEDICATIONS: "What medications are you taking?" (e.g., prenatal vitamins, iron)    vitamine 9. OTHER SYMPTOMS: "Do you have any other symptoms?"    no  Protocols used: PREGNANCY - MORNING SICKNESS (NAUSEA AND VOMITING OF PREGNANCY)-A-AH

## 2020-04-26 NOTE — BH Specialist Note (Signed)
Integrated Behavioral Health via Telemedicine Phone [facility]) Visit  04/26/2020 Leah [name] [id number]  Number of Integrated Behavioral Health visits: 1 Session Start time: 8:16  Session End time: 8:30 Total time: 14 minutes  Referring Provider: Lynnda Shields, MD Type of Service: Individual Patient/Family location: Home [facility] Provider location: Center for [employer] Healthcare at [facility] for Women  All persons participating in visit: Patient Leah [name] and [name]     I connected with Leah [name] by a video enabled telemedicine application [employer]) and verified that I am speaking with the correct person using two identifiers.   Discussed confidentiality: Yes   Confirmed demographics & insurance:  Yes   I discussed that engaging in this virtual visit, they consent to the provision of behavioral healthcare and the services will be billed under their insurance.   Patient and/or legal guardian expressed understanding and consented to virtual visit: Yes   PRESENTING CONCERNS: Patient and/or family reports the following symptoms/concerns: Pt states her primary concern today is hyperemesis that is continuing even after taking anti-nausea medication; pt is seeing therapist for one year, and is waiting to obtain initial appointment for psychiatry for [facility] medication management of bipolar disorder in pregnancy Duration of problem: Current pregnancy; Severity of problem: mild  STRENGTHS (Protective Factors/Coping Skills): Adhering to treatment  ASSESSMENT: Patient currently experiencing Bipolar 1 disorder, as previously diagnosed   GOALS ADDRESSED: Patient will: 1.  Maintain reduction of symptoms of: mood instability and stress  2.  Demonstrate ability to: Increase healthy adjustment to current life circumstances   Progress of Goals: Ongoing  INTERVENTIONS: Interventions utilized:  Supportive Counseling, Psychoeducation and/or Health Education and Link to  [facility] Standardized Assessments completed & reviewed: PHQ9/GAD7 given in past two weeks   OUTCOME: Patient Response: Pt agrees to treatment plan   PLAN: 1. Follow up with behavioral health clinician on : By phone in 2 weeks 2. Behavioral recommendations:  -Continue attending regular therapy appointments -Continue with plan to attend initial psychiatry appointment (after appointment is scheduled) -Continue taking anti-nausea medication as prescribed -Consider additional resources on AVS for support with hyperemesis gravidarum  3. Referral(s): Integrated Art gallery manager (In Clinic) and [employer]:  support: hyperemesis gravidarum  I discussed the assessment and treatment plan with the patient and/or parent/guardian. They were provided an opportunity to ask questions and all were answered. They agreed with the plan and demonstrated an understanding of the instructions.   They were advised to call back or seek an in-person evaluation as appropriate.  I discussed that the purpose of this visit is to provide behavioral health care while limiting exposure to the novel coronavirus.  Discussed there is a possibility of technology failure and discussed alternative modes of communication if that failure occurs.  Leah [name]  Depression screen [facility] 2/9 04/12/2020 03/28/2020 02/14/2020 01/27/2020 11/11/2019  Decreased Interest 0 0 0 0 0  Down, Depressed, Hopeless 0 0 0 0 0  PHQ - 2 Score 0 0 0 0 0  Altered sleeping 0 3 0 0 0  Tired, decreased energy 0 0 0 0 0  Change in appetite 3 3 0 0 0  Feeling bad or failure about yourself  0 0 0 0 0  Trouble concentrating 0 0 0 0 0  Moving slowly or fidgety/restless 0 0 0 0 0  Suicidal thoughts 0 0 0 0 0  PHQ-9 Score 3 6 0 0 0  Some recent data might be hidden   GAD 7 :  Generalized Anxiety Score 04/12/2020 03/28/2020 02/14/2020 01/27/2020  Nervous, Anxious, on Edge 0 0 0 0  Control/stop worrying 0 0 0 0  Worry too much -  different things 0 0 0 0  Trouble relaxing 0 0 0 0  Restless 0 0 0 0  Easily annoyed or irritable 3 0 3 3  Afraid - awful might happen 0 0 0 0  Total GAD 7 Score 3 0 3 3

## 2020-04-27 ENCOUNTER — Ambulatory Visit (HOSPITAL_COMMUNITY): Payer: Medicaid Other | Admitting: Psychiatry

## 2020-04-28 ENCOUNTER — Ambulatory Visit: Payer: Medicaid Other | Admitting: Clinical

## 2020-04-28 ENCOUNTER — Other Ambulatory Visit: Payer: Self-pay

## 2020-04-28 ENCOUNTER — Ambulatory Visit (HOSPITAL_COMMUNITY): Payer: Medicaid Other

## 2020-04-28 DIAGNOSIS — F319 Bipolar disorder, unspecified: Secondary | ICD-10-CM

## 2020-04-28 NOTE — Patient Instructions (Signed)
Center for [facility] Healthcare at [facility] for Women [address] [place], [place] [address] [phone] (main office) [phone] [facility] office)  [email or web address] Scientist, water quality tour of hospital; childbirth education, etc.)  [email or web address] (support for Hyperemesis gravidarum)

## 2020-05-02 ENCOUNTER — Other Ambulatory Visit: Payer: Self-pay

## 2020-05-03 ENCOUNTER — Other Ambulatory Visit: Payer: Self-pay | Admitting: Lactation Services

## 2020-05-03 ENCOUNTER — Encounter: Payer: Medicaid Other | Admitting: Family Medicine

## 2020-05-03 MED ORDER — PROMETHAZINE HCL 25 MG RE SUPP: RECTAL | 0 refills | Status: DC

## 2020-05-03 NOTE — Telephone Encounter (Signed)
Opened in error

## 2020-05-04 ENCOUNTER — Other Ambulatory Visit: Payer: Self-pay | Admitting: Lactation Services

## 2020-05-04 MED ORDER — PROMETHAZINE HCL 25 MG PO TABS: 25.0000 mg | ORAL_TABLET | Freq: Four times a day (QID) | ORAL | 0 refills | Status: DC | PRN

## 2020-05-04 NOTE — Progress Notes (Signed)
Reordered Phenergan tablets and cancelled suppositories at patients request per standing orders for persistent N/V.

## 2020-05-08 ENCOUNTER — Other Ambulatory Visit: Payer: Self-pay

## 2020-05-08 ENCOUNTER — Inpatient Hospital Stay (HOSPITAL_COMMUNITY)
Admission: AD | Admit: 2020-05-08 | Discharge: 2020-05-08 | Disposition: A | Discharge status: Home / Self Care | Payer: Medicaid Other | Attending: Obstetrics & Gynecology | Admitting: Obstetrics & Gynecology

## 2020-05-08 ENCOUNTER — Encounter (HOSPITAL_COMMUNITY): Payer: Self-pay | Admitting: Obstetrics & Gynecology

## 2020-05-08 ENCOUNTER — Encounter: Payer: Self-pay | Admitting: Student

## 2020-05-08 DIAGNOSIS — O99341 Other mental disorders complicating pregnancy, first trimester: Secondary | ICD-10-CM | POA: Insufficient documentation

## 2020-05-08 DIAGNOSIS — Z79899 Other long term (current) drug therapy: Secondary | ICD-10-CM | POA: Insufficient documentation

## 2020-05-08 DIAGNOSIS — N898 Other specified noninflammatory disorders of vagina: Secondary | ICD-10-CM

## 2020-05-08 DIAGNOSIS — O99891 Other specified diseases and conditions complicating pregnancy: Secondary | ICD-10-CM

## 2020-05-08 DIAGNOSIS — K219 Gastro-esophageal reflux disease without esophagitis: Secondary | ICD-10-CM | POA: Diagnosis not present

## 2020-05-08 DIAGNOSIS — O99611 Diseases of the digestive system complicating pregnancy, first trimester: Secondary | ICD-10-CM | POA: Insufficient documentation

## 2020-05-08 DIAGNOSIS — O26891 Other specified pregnancy related conditions, first trimester: Secondary | ICD-10-CM | POA: Insufficient documentation

## 2020-05-08 DIAGNOSIS — Z3A13 13 weeks gestation of pregnancy: Secondary | ICD-10-CM | POA: Diagnosis not present

## 2020-05-08 DIAGNOSIS — O99321 Drug use complicating pregnancy, first trimester: Secondary | ICD-10-CM | POA: Insufficient documentation

## 2020-05-08 DIAGNOSIS — Z881 Allergy status to other antibiotic agents status: Secondary | ICD-10-CM | POA: Diagnosis not present

## 2020-05-08 DIAGNOSIS — F319 Bipolar disorder, unspecified: Secondary | ICD-10-CM | POA: Insufficient documentation

## 2020-05-08 DIAGNOSIS — O210 Mild hyperemesis gravidarum: Secondary | ICD-10-CM | POA: Insufficient documentation

## 2020-05-08 DIAGNOSIS — Z9049 Acquired absence of other specified parts of digestive tract: Secondary | ICD-10-CM | POA: Diagnosis not present

## 2020-05-08 NOTE — MAU Note (Signed)
PT SAYS SHE LOST HER MUCUS PLUG- AT 713PM- LARGE AMT MUCUS. [facility] WITH CLINIC . LAST SEX- SAT. NO PAIN.

## 2020-05-08 NOTE — MAU Provider Note (Signed)
History     CSN: [id number]  Arrival date and time: 05/08/20 1944   First Provider Initiated Contact with Patient 05/08/20 2027      Chief Complaint  Patient presents with  . Vaginal Discharge   24 y.o. G2P1001 @13 .6 weeks presenting with mucous vaginal discharge. States she passed her mucous plug around 7pm today. Denies VB or abd pain. Had sex 2 days ago. No new partner. Hx of HEG, managed by Zofran, compazine, Phenergan, Pepcid, Scopolamine and IV infusions.   OB History    Gravida  2   Para  1   Term  1   Preterm  0   AB  0   Living  1     SAB  0   TAB  0   Ectopic  0   Multiple      Live Births  1           Past Medical History:  Diagnosis Date  . Anemia 2013  . Bipolar 1 disorder ([place])   . Gall stones   . GERD (gastroesophageal reflux disease)   . Seizure [facility]) 2011   pseudoseizures    Past Surgical History:  Procedure Laterality Date  . CHOLECYSTECTOMY N/A 09/28/2014   Procedure: LAPAROSCOPIC CHOLECYSTECTOMY;  Surgeon: Ralene Ok, MD;  Location: [place];  Service: General;  Laterality: N/A;  . CHOLECYSTECTOMY    . DIRECT LARYNGOSCOPY N/A 04/24/2014   Procedure: DIRECT LARYNGOSCOPY;  Surgeon: Ascencion Dike, MD;  Location: [place];  Service: ENT;  Laterality: N/A;  . FOREIGN BODY REMOVAL ESOPHAGEAL N/A 04/24/2014   Procedure: REMOVAL FOREIGN BODY ESOPHAGEAL;  Surgeon: Ascencion Dike, MD;  Location: [facility] OR;  Service: ENT;  Laterality: N/A;  . WISDOM TOOTH EXTRACTION  2013    Family History  Problem Relation Age of Onset  . Arthritis Mother   . Bronchitis Mother   . Asthma Mother   . Hearing loss Paternal Grandfather   . Diabetes Neg Hx   . Stomach cancer Neg Hx   . Colon cancer Neg Hx     Social History   Tobacco Use  . Smoking status: Never Smoker  . Smokeless tobacco: Never Used  . Tobacco comment: marjiuna  Vaping Use  . Vaping Use: Never used  Substance Use Topics  . Alcohol use: Not Currently    Alcohol/week: 6.0 standard drinks     Types: 6 Cans of beer per week  . Drug use: Yes    Types: Marijuana    Comment: last used before pregnancy    Allergies:  Allergies  Allergen Reactions  . Bactrim [Sulfamethoxazole-Trimethoprim] Rash    Medications Prior to Admission  Medication Sig Dispense Refill Last Dose  . Blood Pressure Monitoring (BLOOD PRESSURE KIT) DEVI 1 Device by Does not apply route as needed. 1 each 0 Past Month at Unknown time  . famotidine (PEPCID) 20 MG tablet Take 1 tablet (20 mg total) by mouth 2 (two) times daily. 30 tablet 0 Past Week at Unknown time  . ondansetron (ZOFRAN ODT) 8 MG disintegrating tablet Take 1 tablet (8 mg total) by mouth every 8 (eight) hours as needed for nausea or vomiting. 40 tablet 0 05/08/2020 at Unknown time  . Prenatal Vit-Fe Fumarate-FA (PRENATAL MULTIVITAMIN) TABS tablet Take 1 tablet by mouth daily at 12 noon.   05/07/2020 at Unknown time  . prochlorperazine (COMPAZINE) 10 MG tablet Take 1 tablet (10 mg total) by mouth every 6 (six) hours as needed for nausea or vomiting.  30 tablet 1 05/07/2020 at Unknown time  . promethazine (PHENERGAN) 25 MG tablet Take 1 tablet (25 mg total) by mouth every 6 (six) hours as needed for nausea or vomiting. 30 tablet 0 05/07/2020 at Unknown time  . promethazine in sodium chloride 0.9 % 1,000 mL Please infuse 1 L NS with 25 mg of phenergan added and infused in over two hours IV. Please repeat twice weekly for four weeks. 1 Bag 7 Past Month at Unknown time  . scopolamine (TRANSDERM-SCOP) 1 MG/3DAYS Place 1 patch (1.5 mg total) onto the skin every 3 (three) days. 10 patch 2 Past Week at Unknown time    Review of Systems  Gastrointestinal: Negative for abdominal pain.  Genitourinary: Positive for vaginal discharge. Negative for vaginal bleeding.   Physical Exam   Blood pressure 106/61, pulse 89, temperature 98.4 F (36.9 C), temperature source Oral, resp. rate 20, height 5' (1.524 m), weight 58.9 kg, last menstrual period  02/01/2020.  Physical Exam Constitutional:      General: She is not in acute distress.    Appearance: Normal appearance.  HENT:     Head: Normocephalic and atraumatic.  Cardiovascular:     Rate and Rhythm: Normal rate.  Pulmonary:     Effort: Pulmonary effort is normal. No respiratory distress.  Genitourinary:    Comments: External: no lesions or erythema Vagina: rugated, pink, moist, scant thin white discharge, no mucus Cervix closed/long  Musculoskeletal:        General: Normal range of motion.     Cervical back: Normal range of motion.  Skin:    General: Skin is warm and dry.  Neurological:     General: No focal deficit present.     Mental Status: She is alert and oriented to person, place, and time.  Psychiatric:        Mood and Affect: Mood normal.   FHT 147  MAU Course  Procedures  MDM No signs of threatened abortion. Recent cultures negative, not repeated. Pt reassured. Stable for discharge home.   Assessment and Plan   1. [redacted] weeks gestation of pregnancy   2. Vaginal discharge during pregnancy in second trimester    Discharge home Follow [facility] at [facility] next week as scheduled SAB precautions  Allergies as of 05/08/2020      Reactions   Bactrim [sulfamethoxazole-trimethoprim] Rash      Medication List    TAKE these medications   Blood Pressure Kit Devi 1 Device by Does not apply route as needed.   famotidine 20 MG tablet Commonly known as: PEPCID Take 1 tablet (20 mg total) by mouth 2 (two) times daily.   ondansetron 8 MG disintegrating tablet Commonly known as: Zofran ODT Take 1 tablet (8 mg total) by mouth every 8 (eight) hours as needed for nausea or vomiting.   prenatal multivitamin Tabs tablet Take 1 tablet by mouth daily at 12 noon.   prochlorperazine 10 MG tablet Commonly known as: COMPAZINE Take 1 tablet (10 mg total) by mouth every 6 (six) hours as needed for nausea or vomiting.   promethazine 25 MG tablet Commonly known as:  PHENERGAN Take 1 tablet (25 mg total) by mouth every 6 (six) hours as needed for nausea or vomiting.   promethazine in sodium chloride 0.9 % 1,000 mL Please infuse 1 L NS with 25 mg of phenergan added and infused in over two hours IV. Please repeat twice weekly for four weeks.   scopolamine 1 MG/3DAYS Commonly known as: TRANSDERM-SCOP Place 1  patch (1.5 mg total) onto the skin every 3 (three) days.       Julianne Handler, CNM 05/08/2020, 8:34 PM

## 2020-05-08 NOTE — Discharge Instructions (Signed)

## 2020-05-10 ENCOUNTER — Encounter: Payer: Self-pay | Admitting: *Deleted

## 2020-05-10 ENCOUNTER — Telehealth: Payer: Self-pay | Admitting: Family Medicine

## 2020-05-10 NOTE — Telephone Encounter (Signed)
Want to get scheduled for a blood Transfusion

## 2020-05-10 NOTE — Telephone Encounter (Signed)
Patient's concerns addressed via mychart 

## 2020-05-11 ENCOUNTER — Other Ambulatory Visit: Payer: Self-pay | Admitting: Student

## 2020-05-11 NOTE — Addendum Note (Signed)
Encounter addended by: Claudean Kinds, RN on: 05/11/2020 9:09 AM  Actions taken: Order list changed

## 2020-05-12 ENCOUNTER — Encounter: Payer: Self-pay | Admitting: *Deleted

## 2020-05-19 ENCOUNTER — Other Ambulatory Visit: Payer: Self-pay

## 2020-05-19 ENCOUNTER — Ambulatory Visit (HOSPITAL_COMMUNITY)
Admission: RE | Admit: 2020-05-19 | Discharge: 2020-05-19 | Disposition: A | Discharge status: Home / Self Care | Payer: Medicaid Other | Source: Ambulatory Visit | Attending: Neurology | Admitting: Neurology

## 2020-05-19 DIAGNOSIS — O210 Mild hyperemesis gravidarum: Secondary | ICD-10-CM | POA: Insufficient documentation

## 2020-05-19 MED ORDER — SODIUM CHLORIDE 0.9 % IV SOLN
12.5000 mg | INTRAVENOUS | Status: DC
  Administered 2020-05-19: 12.5 mg via INTRAVENOUS
  Filled 2020-05-19: qty 0.5

## 2020-05-23 ENCOUNTER — Other Ambulatory Visit: Payer: Self-pay

## 2020-05-23 ENCOUNTER — Encounter (HOSPITAL_COMMUNITY)
Admission: RE | Admit: 2020-05-23 | Discharge: 2020-05-23 | Disposition: A | Discharge status: Home / Self Care | Payer: Medicaid Other | Source: Ambulatory Visit | Attending: Student | Admitting: Student

## 2020-05-23 DIAGNOSIS — O210 Mild hyperemesis gravidarum: Secondary | ICD-10-CM | POA: Insufficient documentation

## 2020-05-23 DIAGNOSIS — Z3A16 16 weeks gestation of pregnancy: Secondary | ICD-10-CM | POA: Insufficient documentation

## 2020-05-23 MED ORDER — SODIUM CHLORIDE 0.9 % IV SOLN
12.5000 mg | INTRAVENOUS | Status: DC
  Administered 2020-05-23: 12.5 mg via INTRAVENOUS
  Filled 2020-05-23: qty 0.5

## 2020-05-24 ENCOUNTER — Encounter: Payer: Self-pay | Admitting: *Deleted

## 2020-05-25 ENCOUNTER — Other Ambulatory Visit: Payer: Self-pay

## 2020-05-25 ENCOUNTER — Ambulatory Visit (INDEPENDENT_AMBULATORY_CARE_PROVIDER_SITE_OTHER): Payer: Medicaid Other | Admitting: Obstetrics & Gynecology

## 2020-05-25 ENCOUNTER — Ambulatory Visit: Payer: Medicaid Other

## 2020-05-25 VITALS — BP 107/75 | HR 94 | Wt 130.7 lb

## 2020-05-25 DIAGNOSIS — O0990 Supervision of high risk pregnancy, unspecified, unspecified trimester: Secondary | ICD-10-CM

## 2020-05-25 DIAGNOSIS — F332 Major depressive disorder, recurrent severe without psychotic features: Secondary | ICD-10-CM

## 2020-05-25 DIAGNOSIS — O210 Mild hyperemesis gravidarum: Secondary | ICD-10-CM

## 2020-05-25 MED ORDER — POLYETHYLENE GLYCOL 3350 17 G PO PACK: 17.0000 g | PACK | Freq: Every day | ORAL | 0 refills | Status: DC

## 2020-05-25 NOTE — Patient Instructions (Signed)

## 2020-05-25 NOTE — Progress Notes (Signed)
   PRENATAL VISIT NOTE  Subjective:  Leah [name] is a 24 y.o. G2P1001 at [redacted]w[redacted]d being seen today for ongoing prenatal care.  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  Patient reports nausea and vomiting.  Contractions: Not present. Vag. Bleeding: None.  Movement: Absent. Denies leaking of fluid.   The following portions of the patient's history were reviewed and updated as appropriate: allergies, current medications, past family history, past medical history, past social history, past surgical history and problem list.   Objective:   Vitals:   05/25/20 0903  BP: 107/75  Pulse: 94  Weight: 130 lb 11.2 oz (59.3 kg)    Fetal Status: Fetal Heart Rate (bpm): 147   Movement: Absent     General:  Alert, oriented and cooperative. Patient is in no acute distress.  Skin: Skin is warm and dry. No rash noted.   Cardiovascular: Normal heart rate noted  Respiratory: Normal respiratory effort, no problems with respiration noted  Abdomen: Soft, gravid, appropriate for gestational age.  Pain/Pressure: Present     Pelvic: Cervical exam deferred        Extremities: Normal range of motion.  Edema: None no tenderness or cords or swelling in calves  Mental Status: Normal mood and affect. Normal behavior. Normal judgment and thought content.   Assessment and Plan:  Pregnancy: G2P1001 at [redacted]w[redacted]d 1. Supervision of high risk pregnancy, antepartum Constipation, BM Q4 days,  hard stool. Nausea not controlled well with her meds except no reflux sx. Will use Miralax and review her sx in 2 weeks - polyethylene glycol (MIRALAX) 17 g packet; Take 17 g by mouth daily.  Dispense: 14 each; Refill: 0  2. Hyperemesis gravidarum Discussed at length, added Tx for constipation  3. Major depressive disorder, recurrent severe without psychotic features (HCC) On no medication, stopped Zyprexa, states she has a Veterinary surgeon but needs appt  Preterm labor symptoms and general obstetric precautions including but not limited to vaginal bleeding, contractions, leaking of fluid and fetal movement were reviewed in detail with the patient. Please refer to After Visit Summary for other counseling recommendations.   Return in about 2 weeks (around 06/08/2020) for day of her [place].  Future Appointments  Date Time Provider Department Center  05/26/2020  8:00 AM [facility] None  05/30/2020  8:00 AM [facility] None  06/02/2020  8:00 AM [facility] None  06/09/2020  8:45 AM [facility]5 [facility]  06/09/2020  9:55 AM Alysia Penna, Marolyn Hammock, MD [facility]    Scheryl Darter, MD

## 2020-05-25 NOTE — Progress Notes (Signed)
C/o pain in calves.

## 2020-05-26 ENCOUNTER — Encounter (HOSPITAL_COMMUNITY)
Admission: RE | Admit: 2020-05-26 | Discharge: 2020-05-26 | Disposition: A | Discharge status: Home / Self Care | Payer: Medicaid Other | Source: Ambulatory Visit | Attending: Student | Admitting: Student

## 2020-05-26 DIAGNOSIS — Z3A16 16 weeks gestation of pregnancy: Secondary | ICD-10-CM | POA: Diagnosis not present

## 2020-05-26 DIAGNOSIS — O210 Mild hyperemesis gravidarum: Secondary | ICD-10-CM | POA: Diagnosis not present

## 2020-05-26 MED ORDER — SODIUM CHLORIDE 0.9 % IV SOLN
12.5000 mg | INTRAVENOUS | Status: DC
  Administered 2020-05-26: 12.5 mg via INTRAVENOUS
  Filled 2020-05-26: qty 0.5

## 2020-05-30 ENCOUNTER — Other Ambulatory Visit: Payer: Self-pay

## 2020-05-30 ENCOUNTER — Encounter (HOSPITAL_COMMUNITY)
Admission: RE | Admit: 2020-05-30 | Discharge: 2020-05-30 | Disposition: A | Discharge status: Home / Self Care | Payer: Medicaid Other | Source: Ambulatory Visit | Attending: Student | Admitting: Student

## 2020-05-30 DIAGNOSIS — O210 Mild hyperemesis gravidarum: Secondary | ICD-10-CM | POA: Diagnosis not present

## 2020-05-30 DIAGNOSIS — Z3A16 16 weeks gestation of pregnancy: Secondary | ICD-10-CM | POA: Diagnosis not present

## 2020-05-30 MED ORDER — SODIUM CHLORIDE 0.9 % IV SOLN
12.5000 mg | INTRAVENOUS | Status: DC
  Administered 2020-05-30: 12.5 mg via INTRAVENOUS
  Filled 2020-05-30: qty 0.5

## 2020-05-31 ENCOUNTER — Other Ambulatory Visit: Payer: Self-pay | Admitting: Student

## 2020-05-31 DIAGNOSIS — O210 Mild hyperemesis gravidarum: Secondary | ICD-10-CM

## 2020-05-31 MED ORDER — SODIUM CHLORIDE 0.9 % IV SOLN: INTRAVENOUS | 16 refills | Status: DC

## 2020-06-02 ENCOUNTER — Telehealth: Payer: Self-pay | Admitting: Obstetrics and Gynecology

## 2020-06-02 ENCOUNTER — Encounter (HOSPITAL_COMMUNITY)
Admission: RE | Admit: 2020-06-02 | Discharge: 2020-06-02 | Disposition: A | Discharge status: Home / Self Care | Payer: Medicaid Other | Source: Ambulatory Visit | Attending: Student | Admitting: Student

## 2020-06-02 DIAGNOSIS — Z3A16 16 weeks gestation of pregnancy: Secondary | ICD-10-CM | POA: Diagnosis not present

## 2020-06-02 DIAGNOSIS — O210 Mild hyperemesis gravidarum: Secondary | ICD-10-CM | POA: Diagnosis not present

## 2020-06-02 MED ORDER — SODIUM CHLORIDE 0.9 % IV SOLN
12.5000 mg | INTRAVENOUS | Status: DC
  Administered 2020-06-02: 12.5 mg via INTRAVENOUS
  Filled 2020-06-02: qty 0.5

## 2020-06-02 NOTE — Telephone Encounter (Signed)
Attempted to call [facility] and no answer. LM for them of most current orders. Sent message to K. Crisoforo Oxford, CNM to check on orders.

## 2020-06-02 NOTE — Telephone Encounter (Signed)
Cathy from [facility] needs a call back ASAP- * pt in in her office now**about pt meds and a new order.

## 2020-06-02 NOTE — Progress Notes (Signed)
Leah [name] was here for her IV fluids with phenergan and stated that she has talked with her doctors and they agreed that she can come 4 times a week now.  We have no order to reflect that. Office call and nurse was given a message to clarify with office and message sent thru Epic to Luna Kitchens NP to clarify and up date order

## 2020-06-04 ENCOUNTER — Other Ambulatory Visit: Payer: Self-pay | Admitting: Student

## 2020-06-05 ENCOUNTER — Encounter: Payer: Self-pay | Admitting: Lactation Services

## 2020-06-05 ENCOUNTER — Encounter (HOSPITAL_COMMUNITY)
Admission: RE | Admit: 2020-06-05 | Discharge: 2020-06-05 | Disposition: A | Discharge status: Home / Self Care | Payer: Medicaid Other | Source: Ambulatory Visit | Attending: Student | Admitting: Student

## 2020-06-05 ENCOUNTER — Other Ambulatory Visit: Payer: Self-pay

## 2020-06-05 DIAGNOSIS — O210 Mild hyperemesis gravidarum: Secondary | ICD-10-CM | POA: Diagnosis not present

## 2020-06-05 DIAGNOSIS — Z3A16 16 weeks gestation of pregnancy: Secondary | ICD-10-CM | POA: Diagnosis not present

## 2020-06-05 MED ORDER — SODIUM CHLORIDE 0.9 % IV SOLN
12.5000 mg | INTRAVENOUS | Status: DC
  Administered 2020-06-05: 12.5 mg via INTRAVENOUS
  Filled 2020-06-05: qty 0.5

## 2020-06-06 ENCOUNTER — Other Ambulatory Visit: Payer: Self-pay

## 2020-06-06 ENCOUNTER — Encounter (HOSPITAL_COMMUNITY)
Admission: RE | Admit: 2020-06-06 | Discharge: 2020-06-06 | Disposition: A | Discharge status: Home / Self Care | Payer: Medicaid Other | Source: Ambulatory Visit | Attending: Student | Admitting: Student

## 2020-06-06 DIAGNOSIS — Z3A16 16 weeks gestation of pregnancy: Secondary | ICD-10-CM | POA: Diagnosis not present

## 2020-06-06 DIAGNOSIS — O210 Mild hyperemesis gravidarum: Secondary | ICD-10-CM | POA: Diagnosis not present

## 2020-06-06 MED ORDER — SODIUM CHLORIDE 0.9 % IV SOLN
12.5000 mg | INTRAVENOUS | Status: DC
  Administered 2020-06-06: 12.5 mg via INTRAVENOUS
  Filled 2020-06-06: qty 0.5

## 2020-06-07 ENCOUNTER — Encounter (HOSPITAL_COMMUNITY)
Admission: RE | Admit: 2020-06-07 | Discharge: 2020-06-07 | Disposition: A | Discharge status: Home / Self Care | Payer: Medicaid Other | Source: Ambulatory Visit | Attending: Student | Admitting: Student

## 2020-06-07 ENCOUNTER — Other Ambulatory Visit: Payer: Self-pay

## 2020-06-07 DIAGNOSIS — Z3A16 16 weeks gestation of pregnancy: Secondary | ICD-10-CM | POA: Diagnosis not present

## 2020-06-07 DIAGNOSIS — O210 Mild hyperemesis gravidarum: Secondary | ICD-10-CM | POA: Diagnosis not present

## 2020-06-07 MED ORDER — SODIUM CHLORIDE 0.9 % IV SOLN
12.5000 mg | INTRAVENOUS | Status: DC
  Administered 2020-06-07: 12.5 mg via INTRAVENOUS
  Filled 2020-06-07: qty 0.5

## 2020-06-08 ENCOUNTER — Encounter (HOSPITAL_COMMUNITY)
Admission: RE | Admit: 2020-06-08 | Discharge: 2020-06-08 | Disposition: A | Discharge status: Home / Self Care | Payer: Medicaid Other | Source: Ambulatory Visit | Attending: Student | Admitting: Student

## 2020-06-08 DIAGNOSIS — O210 Mild hyperemesis gravidarum: Secondary | ICD-10-CM | POA: Diagnosis not present

## 2020-06-08 DIAGNOSIS — Z3A16 16 weeks gestation of pregnancy: Secondary | ICD-10-CM | POA: Diagnosis not present

## 2020-06-08 MED ORDER — SODIUM CHLORIDE 0.9 % IV SOLN
12.5000 mg | INTRAVENOUS | Status: DC
  Administered 2020-06-08: 12.5 mg via INTRAVENOUS
  Filled 2020-06-08: qty 0.5

## 2020-06-09 ENCOUNTER — Ambulatory Visit (INDEPENDENT_AMBULATORY_CARE_PROVIDER_SITE_OTHER): Payer: Medicaid Other | Admitting: Obstetrics and Gynecology

## 2020-06-09 ENCOUNTER — Other Ambulatory Visit: Payer: Self-pay

## 2020-06-09 ENCOUNTER — Other Ambulatory Visit: Payer: Self-pay | Admitting: Family Medicine

## 2020-06-09 ENCOUNTER — Ambulatory Visit: Payer: Medicaid Other | Attending: Family Medicine

## 2020-06-09 ENCOUNTER — Other Ambulatory Visit: Payer: Self-pay | Admitting: *Deleted

## 2020-06-09 ENCOUNTER — Encounter: Payer: Self-pay | Admitting: Obstetrics and Gynecology

## 2020-06-09 ENCOUNTER — Inpatient Hospital Stay (HOSPITAL_COMMUNITY)
Admission: AD | Admit: 2020-06-09 | Discharge: 2020-06-09 | Disposition: A | Discharge status: Home / Self Care | Payer: Medicaid Other | Attending: Obstetrics & Gynecology | Admitting: Obstetrics & Gynecology

## 2020-06-09 ENCOUNTER — Encounter (HOSPITAL_COMMUNITY): Payer: Self-pay | Admitting: Obstetrics & Gynecology

## 2020-06-09 VITALS — BP 118/75 | HR 84 | Wt 138.1 lb

## 2020-06-09 DIAGNOSIS — O26892 Other specified pregnancy related conditions, second trimester: Secondary | ICD-10-CM | POA: Diagnosis not present

## 2020-06-09 DIAGNOSIS — R251 Tremor, unspecified: Secondary | ICD-10-CM | POA: Diagnosis not present

## 2020-06-09 DIAGNOSIS — R569 Unspecified convulsions: Secondary | ICD-10-CM | POA: Diagnosis not present

## 2020-06-09 DIAGNOSIS — R079 Chest pain, unspecified: Secondary | ICD-10-CM | POA: Diagnosis not present

## 2020-06-09 DIAGNOSIS — I10 Essential (primary) hypertension: Secondary | ICD-10-CM | POA: Diagnosis not present

## 2020-06-09 DIAGNOSIS — Z3A18 18 weeks gestation of pregnancy: Secondary | ICD-10-CM | POA: Diagnosis not present

## 2020-06-09 DIAGNOSIS — R0902 Hypoxemia: Secondary | ICD-10-CM | POA: Diagnosis not present

## 2020-06-09 DIAGNOSIS — O99353 Diseases of the nervous system complicating pregnancy, third trimester: Secondary | ICD-10-CM

## 2020-06-09 DIAGNOSIS — Z3490 Encounter for supervision of normal pregnancy, unspecified, unspecified trimester: Secondary | ICD-10-CM | POA: Diagnosis not present

## 2020-06-09 DIAGNOSIS — O210 Mild hyperemesis gravidarum: Secondary | ICD-10-CM | POA: Insufficient documentation

## 2020-06-09 DIAGNOSIS — Z79899 Other long term (current) drug therapy: Secondary | ICD-10-CM | POA: Diagnosis not present

## 2020-06-09 DIAGNOSIS — F331 Major depressive disorder, recurrent, moderate: Secondary | ICD-10-CM | POA: Diagnosis not present

## 2020-06-09 DIAGNOSIS — O283 Abnormal ultrasonic finding on antenatal screening of mother: Secondary | ICD-10-CM

## 2020-06-09 DIAGNOSIS — F445 Conversion disorder with seizures or convulsions: Secondary | ICD-10-CM

## 2020-06-09 DIAGNOSIS — O0990 Supervision of high risk pregnancy, unspecified, unspecified trimester: Secondary | ICD-10-CM

## 2020-06-09 LAB — CBC
HCT: 30.8 % — ABNORMAL LOW (ref 36.0–46.0)
Hemoglobin: 10 g/dL — ABNORMAL LOW (ref 12.0–15.0)
MCH: 30.8 pg (ref 26.0–34.0)
MCHC: 32.5 g/dL (ref 30.0–36.0)
MCV: 94.8 fL (ref 80.0–100.0)
Platelets: 230 10*3/uL (ref 150–400)
RBC: 3.25 MIL/uL — ABNORMAL LOW (ref 3.87–5.11)
RDW: 13.7 % (ref 11.5–15.5)
WBC: 8.8 10*3/uL (ref 4.0–10.5)
nRBC: 0 % (ref 0.0–0.2)

## 2020-06-09 LAB — COMPREHENSIVE METABOLIC PANEL
ALT: 20 U/L (ref 0–44)
AST: 19 U/L (ref 15–41)
Albumin: 3 g/dL — ABNORMAL LOW (ref 3.5–5.0)
Alkaline Phosphatase: 53 U/L (ref 38–126)
Anion gap: 7 (ref 5–15)
BUN: 5 mg/dL — ABNORMAL LOW (ref 6–20)
CO2: 22 mmol/L (ref 22–32)
Calcium: 9.1 mg/dL (ref 8.9–10.3)
Chloride: 108 mmol/L (ref 98–111)
Creatinine, Ser: 0.55 mg/dL (ref 0.44–1.00)
GFR, Estimated: >60 mL/min (ref >60)
Glucose, Bld: 81 mg/dL (ref 70–99)
Potassium: 3.5 mmol/L (ref 3.5–5.1)
Sodium: 137 mmol/L (ref 135–145)
Total Bilirubin: 0.4 mg/dL (ref 0.3–1.2)
Total Protein: 6.3 g/dL — ABNORMAL LOW (ref 6.5–8.1)

## 2020-06-09 LAB — URINALYSIS, ROUTINE W REFLEX MICROSCOPIC
Bilirubin Urine: NEGATIVE
Glucose, UA: NEGATIVE mg/dL
Hgb urine dipstick: NEGATIVE
Ketones, ur: NEGATIVE mg/dL
Leukocytes,Ua: NEGATIVE
Nitrite: NEGATIVE
Protein, ur: NEGATIVE mg/dL
Specific Gravity, Urine: 1.008 (ref 1.005–1.030)
pH: 7 (ref 5.0–8.0)

## 2020-06-09 LAB — RAPID URINE DRUG SCREEN, HOSP PERFORMED
Amphetamines: NOT DETECTED
Barbiturates: NOT DETECTED
Benzodiazepines: NOT DETECTED
Cocaine: NOT DETECTED
Opiates: NOT DETECTED
Tetrahydrocannabinol: NOT DETECTED

## 2020-06-09 MED ORDER — LACTATED RINGERS IV BOLUS
1000.0000 mL | Freq: Once | INTRAVENOUS | Status: AC
  Administered 2020-06-09: 1000 mL via INTRAVENOUS

## 2020-06-09 NOTE — Progress Notes (Signed)
Pt spouse came to RN station stating pt was having a seizure episode. Lestine Box, RN, FLangston Masker RN and L. Leftwich-Kirby, CNW at bedside. Pt was laying on leftside and non responsive to command. Pt vitals WNL. LR 1L bolus initiated and CBC and CMP drawn and sent. PT was responsive to voice @ 1819, Pt A&O x4 and awake.   Oretta Berkland M Lileigh Fahringer RNC-OB

## 2020-06-09 NOTE — Patient Instructions (Signed)

## 2020-06-09 NOTE — Progress Notes (Signed)
Subjective:  Leah [name] is a 24 y.o. G2P1001 at [redacted]w[redacted]d being seen today for ongoing prenatal care.  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  Patient reports N/V but seems to be improving. .  Contractions: Not present. Vag. Bleeding: None.  Movement: Present. Denies leaking of fluid.   The following portions of the patient's history were reviewed and updated as appropriate: allergies, current medications, past family history, past medical history, past social history, past surgical history and problem list. Problem list updated.  Objective:   Vitals:   06/09/20 1032  BP: 118/75  Pulse: 84  Weight: 138 lb 1.6 oz (62.6 kg)    Fetal Status: Fetal Heart Rate (bpm): 154   Movement: Present     General:  Alert, oriented and cooperative. Patient is in no acute distress.  Skin: Skin is warm and dry. No rash noted.   Cardiovascular: Normal heart rate noted  Respiratory: Normal respiratory effort, no problems with respiration noted  Abdomen: Soft, gravid, appropriate for gestational age. Pain/Pressure: Absent     Pelvic:  Cervical exam deferred        Extremities: Normal range of motion.  Edema: None  Mental Status: Normal mood and affect. Normal behavior. Normal judgment and thought content.   Urinalysis:      Assessment and Plan:  Pregnancy: G2P1001 at [redacted]w[redacted]d  1. Supervision of high risk pregnancy, antepartum Stable Anatomy scan today  2. Hyperemesis  gravidarum Stable Wt up from last visit  Preterm labor symptoms and general obstetric precautions including but not limited to vaginal bleeding, contractions, leaking of fluid and fetal movement were reviewed in detail with the patient. Please refer to After Visit Summary for other counseling recommendations.  Return in about 4 weeks (around 07/07/2020) for OB visit, virtual, any provider.   Hermina Staggers, MD

## 2020-06-09 NOTE — MAU Provider Note (Signed)
Chief Complaint: No chief complaint on file.   First Provider Initiated Contact with Patient 06/09/20 1739      SUBJECTIVE HPI: Leah [name] is a 24 y.o. G2P1001 at 22w3dwho presents to maternity admissions via EMS with reported seizures at home. Her s/o witnessed the patient shaking and being unresponsive whiel lying in bed at home and called EMS. The pt arrived and was alert and oriented, and able to give complete history. 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 She reports her last seizure was during pregnancy in 2014.  She reports recent n/v but no other increased stress in her life.  She reports she has not kept down much food/fluids in the last few days.     HPI  Past Medical History:  Diagnosis Date  . Anemia 2013  . B[place] 1 disorder ([place]   . Gall stones   . GERD (gastroesophageal reflux disease)   . Seizure ([facility] 2011   pseudoseizures   Past Surgical History:  Procedure Laterality Date  . CHOLECYSTECTOMY N/A 09/28/2014   Procedure: LAPAROSCOPIC CHOLECYSTECTOMY;  Surgeon: ARalene Ok MD;  [place] [facility]  Service: General;  Laterality: N/A;  . CHOLECYSTECTOMY    . DIRECT LARYNGOSCOPY N/A 04/24/2014   Procedure: DIRECT LARYNGOSCOPY;  Surgeon: SAscencion Dike MD;  [place]  Service: ENT;  Laterality: N/A;  . FOREIGN BODY REMOVAL ESOPHAGEAL N/A 04/24/2014   Procedure: REMOVAL FOREIGN BODY ESOPHAGEAL;  Surgeon: SAscencion Dike MD;  [place] [facility]  Service: ENT;  Laterality: N/A;  . WISDOM TOOTH EXTRACTION  2013   Social History   Socioeconomic History  . Marital status: Single    Spouse name: Not on file  . Number of children: 1  . Years of education: 159th   . Highest education level: Not on file  Occupational History  . 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 Smoking status: Never Smoker  . Smokeless tobacco: Never Used  . Tobacco comment: marjiuna  Vaping Use  . Vaping Use: Never used  Substance and Sexual Activity  .  Alcohol use: Not Currently    Alcohol/week: 6.0 standard drinks    Types: 6 Cans of beer per week  . Drug use: Yes    Types: Marijuana    Comment: last used before pregnancy  . Sexual activity: Yes    Partners: Male    Birth control/protection: Patch  Other Topics Concern  . Not on file  Social History Narrative   ** Merged History Encounter **       Live with roommate and son.    [facility] [employer] [employer]    Social Determinants of Health   Financial Resource Strain:   . Difficulty of Paying Living Expenses: Not on file  Food Insecurity: No Food Insecurity  . Worried About RCharity fundraiserin the Last Year: Never true  . Ran Out of Food in the Last Year: Never true  Transportation Needs: No Transportation Needs  . Lack of Transportation (Medical): No  . Lack of Transportation (Non-Medical): No  Physical Activity:   . Days of Exercise per Week: Not on file  . Minutes of Exercise per Session: Not on file  Stress:   . Feeling of Stress : Not on file  Social Connections:   . Frequency of Communication with Friends and Family: Not on file  . Frequency of Social Gatherings with Friends and Family: Not on file  .  Attends Religious Services: Not on file  . Active Member of Clubs or Organizations: Not on file  . Attends Archivist Meetings: Not on file  . Marital Status: Not on file  Intimate Partner Violence: Not At Risk  . Fear of Current or Ex-Partner: No  . Emotionally Abused: No  . Physically Abused: No  . Sexually Abused: No   No current facility-administered medications on file prior to encounter.   Current Outpatient Medications on File Prior to Encounter  Medication Sig Dispense Refill  . ondansetron (ZOFRAN ODT) 8 MG disintegrating tablet Take 1 tablet (8 mg total) by mouth every 8 (eight) hours as needed for nausea or vomiting. 40 tablet 0  . polyethylene glycol (MIRALAX) 17 g packet Take 17 g by mouth daily. 14 each 0  . Prenatal Vit-Fe Fumarate-FA  (PRENATAL MULTIVITAMIN) TABS tablet Take 1 tablet by mouth daily at 12 noon.    . promethazine (PHENERGAN) 25 MG tablet Take 1 tablet (25 mg total) by mouth every 6 (six) hours as needed for nausea or vomiting. 30 tablet 0  . Blood Pressure Monitoring (BLOOD PRESSURE KIT) DEVI 1 Device by Does not apply route as needed. 1 each 0  . famotidine (PEPCID) 20 MG tablet Take 1 tablet (20 mg total) by mouth 2 (two) times daily. 30 tablet 0  . prochlorperazine (COMPAZINE) 10 MG tablet Take 1 tablet (10 mg total) by mouth every 6 (six) hours as needed for nausea or vomiting. 30 tablet 1  . promethazine in sodium chloride 0.9 % 1,000 mL Please infuse 1 L NS with 25 mg of phenergan added and infused in over two hours IV. Please repeat 4 times weekly for the next  four weeks. 1 Bag 16  . scopolamine (TRANSDERM-SCOP) 1 MG/3DAYS Place 1 patch (1.5 mg total) onto the skin every 3 (three) days. 10 patch 2   Allergies  Allergen Reactions  . Bactrim [Sulfamethoxazole-Trimethoprim] Rash    ROS:  Review of Systems  Constitutional: Negative for chills, fatigue and fever.  Respiratory: Negative for shortness of breath.   Cardiovascular: Negative for chest pain.  Gastrointestinal: Positive for nausea and vomiting.  Genitourinary: Negative for difficulty urinating, dysuria, flank pain, pelvic pain, vaginal bleeding, vaginal discharge and vaginal pain.  Neurological: Positive for seizures. Negative for dizziness and headaches.  Psychiatric/Behavioral: Negative.      I have reviewed patient's Past Medical Hx, Surgical Hx, Family Hx, Social Hx, medications and allergies.   Physical Exam   Patient Vitals for the past 24 hrs:  BP Temp Temp src Pulse Resp  06/09/20 1722 110/62 98.1 F (36.7 C) Oral 90 16   Constitutional: Well-developed, well-nourished female in no acute distress.  HEART: normal rate, heart sounds, regular rhythm RESP: normal effort, lung sounds clear and equal bilaterally GI: Abd soft,  non-tender. Pos BS x 4 MS: Extremities nontender, no edema, normal ROM Neurologic: Alert and oriented x 4.  GU: Neg CVAT.  PELVIC EXAM: Deferred  FHT 148 by doppler  LAB RESULTS Results for orders placed or performed during the hospital encounter of 06/09/20 (from the past 24 hour(s))  CBC     Status: Abnormal   Collection Time: 06/09/20  6:25 PM  Result Value Ref Range   WBC 8.8 4.0 - 10.5 K/uL   RBC 3.25 (L) 3.87 - 5.11 MIL/uL   Hemoglobin 10.0 (L) 12.0 - 15.0 g/dL   HCT 30.8 (L) 36 - 46 %   MCV 94.8 80.0 - 100.0 fL  MCH 30.8 26.0 - 34.0 pg   MCHC 32.5 30.0 - 36.0 g/dL   RDW 13.7 11.5 - 15.5 %   Platelets 230 150 - 400 K/uL   nRBC 0.0 0.0 - 0.2 %  Comprehensive metabolic panel     Status: Abnormal   Collection Time: 06/09/20  6:25 PM  Result Value Ref Range   Sodium 137 135 - 145 mmol/L   Potassium 3.5 3.5 - 5.1 mmol/L   Chloride 108 98 - 111 mmol/L   CO2 22 22 - 32 mmol/L   Glucose, Bld 81 70 - 99 mg/dL   BUN 5 (L) 6 - 20 mg/dL   Creatinine, Ser 0.55 0.44 - 1.00 mg/dL   Calcium 9.1 8.9 - 10.3 mg/dL   Total Protein 6.3 (L) 6.5 - 8.1 g/dL   Albumin 3.0 (L) 3.5 - 5.0 g/dL   AST 19 15 - 41 U/L   ALT 20 0 - 44 U/L   Alkaline Phosphatase 53 38 - 126 U/L   Total Bilirubin 0.4 0.3 - 1.2 mg/dL   GFR, Estimated >60 >60 mL/min   Anion gap 7 5 - 15  Urinalysis, Routine w reflex microscopic     Status: None   Collection Time: 06/09/20  6:40 PM  Result Value Ref Range   Color, Urine YELLOW YELLOW   APPearance CLEAR CLEAR   Specific Gravity, Urine 1.008 1.005 - 1.030   pH 7.0 5.0 - 8.0   Glucose, UA NEGATIVE NEGATIVE mg/dL   Hgb urine dipstick NEGATIVE NEGATIVE   Bilirubin Urine NEGATIVE NEGATIVE   Ketones, ur NEGATIVE NEGATIVE mg/dL   Protein, ur NEGATIVE NEGATIVE mg/dL   Nitrite NEGATIVE NEGATIVE   Leukocytes,Ua NEGATIVE NEGATIVE  Rapid urine drug screen (hospital performed)     Status: None   Collection Time: 06/09/20  6:40 PM  Result Value Ref Range   Opiates NONE  DETECTED NONE DETECTED   Cocaine NONE DETECTED NONE DETECTED   Benzodiazepines NONE DETECTED NONE DETECTED   Amphetamines NONE DETECTED NONE DETECTED   Tetrahydrocannabinol NONE DETECTED NONE DETECTED   Barbiturates NONE DETECTED NONE DETECTED    A/Positive/-- (09/21 1017)  IMAGING US MFM OB DETAIL +14 WK  Result Date: 06/09/2020 ----------------------------------------------------------------------  OBSTETRICS REPORT                       (Signed Final 06/09/2020 01:16 pm) ---------------------------------------------------------------------- Patient Info  ID #:       [id number]                          D.O.B.:  10/29/1995 (24 yrs)  Name:       Kannon M Malloy                    Visit Date: 06/09/2020 08:49 am ---------------------------------------------------------------------- Performed By  Attending:        Ravi Shankar MD        Ref. Address:     [address]                                                             [address]                                                               [place], [place]  Performed By:     Germain Osgood            [facility]                                                             [place] [facility]                                                             Women  Referred By:      Donnamae Jude                    MD ---------------------------------------------------------------------- Orders  #  Description                           Code        Ordered By  1  [place] MFM OB DETAIL +14 WK               76811.01    TANYA PRATT ----------------------------------------------------------------------  #  Order #                     Accession #                Episode #  1  [id number]                   [id number]                 [id number] ---------------------------------------------------------------------- Indications   Encounter for antenatal screening for          Z36.3  malformations (LR NIPS)  Medical complication of pregnancy (Bipolar     O26.90  1, Anxiety)  Seizure disorder (Pseudoseizures)              O99.350 G40.909  Hyperemesis gravidarum                         O21.0  [redacted] weeks gestation of pregnancy                Z3A.18 ---------------------------------------------------------------------- Fetal Evaluation  Num Of Fetuses:         1  Fetal Heart Rate(bpm):  138  Cardiac Activity:  Observed  Presentation:           Cephalic  Placenta:               Anterior  P. Cord Insertion:      Visualized, central  Amniotic Fluid  AFI FV:      Within normal limits                              Largest Pocket(cm)                              4.09 ---------------------------------------------------------------------- Biometry  BPD:      42.9  mm     G. Age:  19w 0d         74  %    CI:         79.2   %    70 - 86                                                          FL/HC:      18.2   %    15.8 - 18  HC:      152.4  mm     G. Age:  18w 2d         33  %    HC/AC:      1.13        1.07 - 1.29  AC:      135.3  mm     G. Age:  19w 0d         66  %    FL/BPD:     64.8   %  FL:       27.8  mm     G. Age:  18w 4d         47  %    FL/AC:      20.5   %    20 - 24  HUM:      26.7  mm     G. Age:  18w 3d         55  %  CER:      18.4  mm     G. Age:  18w 1d         34  %  NFT:       3.6  mm  LV:        7.5  mm  CM:        2.8  mm  Est. FW:     254  gm      0 lb 9 oz     64  % ---------------------------------------------------------------------- OB History  Gravidity:    2  Living:       1 ---------------------------------------------------------------------- Gestational Age  LMP:           18w 3d        Date:  02/01/20                 EDD:   11/07/20  U/S Today:     18w 5d                                          EDD:   11/05/20  Best:          18w 3d     Det. By:  LMP  (02/01/20)          EDD:   11/07/20  ---------------------------------------------------------------------- Anatomy  Cranium:               Appears normal         LVOT:                   Appears normal  Cavum:                 Appears normal         Aortic Arch:            Appears normal  Ventricles:            Appears normal         Ductal Arch:            Not well visualized  Choroid Plexus:        Appears normal         Diaphragm:              Appears normal  Cerebellum:            Appears normal         Stomach:                Appears normal, left                                                                        sided  Posterior Fossa:       Appears normal         Abdomen:                Appears normal  Nuchal Fold:           Appears normal         Abdominal Wall:         Appears nml (cord                                                                        insert, abd wall)  Face:                  Appears normal         Cord Vessels:           Appears normal (3                         (orbits and profile)                           vessel cord)  Lips:                  Appears normal         Kidneys:                  Appear normal  Palate:                Not well visualized    Bladder:                Appears normal  Thoracic:              Appears normal         Spine:                  Appears normal  Heart:                 Appears normal; EIF    Upper Extremities:      Appears normal  RVOT:                  Appears normal         Lower Extremities:      Appears normal  Other:  Heels/feet and open hands/5th digits visualized. Fetus appears to be          female. ---------------------------------------------------------------------- Cervix Uterus Adnexa  Cervix  Length:           4.12  cm.  Normal appearance by transabdominal scan.  Uterus  No abnormality visualized.  Right Ovary  Not visualized.  Left Ovary  Within normal limits.  Cul De Sac  No free fluid seen.  Adnexa  No adnexal mass visualized.  ---------------------------------------------------------------------- Impression  G2 P1. Patient is here for fetal anatomy scan. She gives  history of seizures and did not have recent episodes.  On cell-free fetal DNA screening, the risks of fetal  aneuploidies are not increased .  Obstetric history is significant for a term vaginal delivery.  We performed fetal anatomy scan. An echogenic intracardiac  focus is seen. No other makers of aneuploidies or fetal  structural defects are seen. Fetal biometry is consistent with  her previously-established dates. Amniotic fluid is normal and  good fetal activity is seen.  I informed the patient that given that she had low rik for fetal  aneuploidies on cell-free fetal DNA screening, finding of  echogenic intracardiac focus should be considered a normal  variant and that the risk of trisomy 21 is not increased. I also  reassured that echogenic focus does not increase the risk of  cardiac defects. I also informed her that only amniocentesis  will give a defintive result on the fetal karyotype.  Patient opted not to have amniocentesis. ---------------------------------------------------------------------- Recommendations  -An appointment was made for her to return in 4 weeks for  completion of fetal anatomy. ----------------------------------------------------------------------                  Ravi Shankar, MD Electronically Signed Final Report   06/09/2020 01:16 pm ----------------------------------------------------------------------   MAU Management/MDM: Orders Placed This Encounter  Procedures  . CBC  . Comprehensive metabolic panel  . Urinalysis, Routine w reflex microscopic  . Rapid urine drug screen (hospital performed)  . Ambulatory referral to Neurology  . ED EKG  . Discharge patient    Meds ordered this encounter  Medications  . lactated ringers bolus 1,000 mL    Pt without acute findings.  VS normal, CBC, CMP with normal results.  Pt with another  episode while in MAU, s/o called RN and CNM to room because pt became unresponsive. Pt was found to be unresponsive to sound, touch with normal VS, pt with some rapid eye movement but no other visible seizure activity.  Episode lasted   5-10 minutes and pt was immediately responsive and alert and oriented x 3.  Consult Neurology.  Per Dr Earnestine Leys, if stable, labs wnl, should follow up with EEG outpatient next week. Precautions given and pt discharged in stable condition.    Pt to return to MAU as needed for emergencies.   ASSESSMENT 1. Hyperemesis gravidarum   2. Psychogenic nonepileptic seizure     PLAN Discharge home Allergies as of 06/09/2020      Reactions   Bactrim [sulfamethoxazole-trimethoprim] Rash      Medication List    TAKE these medications   Blood Pressure Kit Devi 1 Device by Does not apply route as needed.   famotidine 20 MG tablet Commonly known as: PEPCID Take 1 tablet (20 mg total) by mouth 2 (two) times daily.   ondansetron 8 MG disintegrating tablet Commonly known as: Zofran ODT Take 1 tablet (8 mg total) by mouth every 8 (eight) hours as needed for nausea or vomiting.   polyethylene glycol 17 g packet Commonly known as: MiraLax Take 17 g by mouth daily.   prenatal multivitamin Tabs tablet Take 1 tablet by mouth daily at 12 noon.   prochlorperazine 10 MG tablet Commonly known as: COMPAZINE Take 1 tablet (10 mg total) by mouth every 6 (six) hours as needed for nausea or vomiting.   promethazine 25 MG tablet Commonly known as: PHENERGAN Take 1 tablet (25 mg total) by mouth every 6 (six) hours as needed for nausea or vomiting.   promethazine in sodium chloride 0.9 % 1,000 mL Please infuse 1 L NS with 25 mg of phenergan added and infused in over two hours IV. Please repeat 4 times weekly for the next  four weeks.   scopolamine 1 MG/3DAYS Commonly known as: TRANSDERM-SCOP Place 1 patch (1.5 mg total) onto the skin every 3 (three) days.       Follow-up  [place] [name] Follow up.   Specialty: Obstetrics and Gynecology Contact information: [place] [id number] [phone]       [facility] Follow up.   Why: Neurology will call you to schedule testing next week Contact information: [place], [place] Certified Nurse-Midwife 06/09/2020  8:21 PM

## 2020-06-12 ENCOUNTER — Encounter (HOSPITAL_COMMUNITY)
Admission: RE | Admit: 2020-06-12 | Discharge: 2020-06-12 | Disposition: A | Discharge status: Home / Self Care | Payer: Medicaid Other | Source: Ambulatory Visit | Attending: Student | Admitting: Student

## 2020-06-12 ENCOUNTER — Encounter: Payer: Self-pay | Admitting: *Deleted

## 2020-06-12 ENCOUNTER — Encounter: Payer: Self-pay | Admitting: Neurology

## 2020-06-12 ENCOUNTER — Other Ambulatory Visit: Payer: Self-pay

## 2020-06-12 ENCOUNTER — Other Ambulatory Visit: Payer: Self-pay | Admitting: *Deleted

## 2020-06-12 DIAGNOSIS — O210 Mild hyperemesis gravidarum: Secondary | ICD-10-CM | POA: Diagnosis not present

## 2020-06-12 DIAGNOSIS — Z3A16 16 weeks gestation of pregnancy: Secondary | ICD-10-CM | POA: Diagnosis not present

## 2020-06-12 MED ORDER — SODIUM CHLORIDE 0.9 % IV SOLN
12.5000 mg | INTRAVENOUS | Status: DC
  Administered 2020-06-12: 12.5 mg via INTRAVENOUS
  Filled 2020-06-12: qty 0.5

## 2020-06-12 MED ORDER — PROMETHAZINE HCL 25 MG PO TABS: 25.0000 mg | ORAL_TABLET | Freq: Four times a day (QID) | ORAL | 0 refills | Status: DC | PRN

## 2020-06-13 ENCOUNTER — Other Ambulatory Visit: Payer: Self-pay

## 2020-06-13 ENCOUNTER — Encounter (HOSPITAL_COMMUNITY)
Admission: RE | Admit: 2020-06-13 | Discharge: 2020-06-13 | Disposition: A | Discharge status: Home / Self Care | Payer: Medicaid Other | Source: Ambulatory Visit | Attending: Student | Admitting: Student

## 2020-06-13 DIAGNOSIS — O210 Mild hyperemesis gravidarum: Secondary | ICD-10-CM | POA: Diagnosis not present

## 2020-06-13 DIAGNOSIS — Z3A16 16 weeks gestation of pregnancy: Secondary | ICD-10-CM | POA: Diagnosis not present

## 2020-06-13 MED ORDER — SODIUM CHLORIDE 0.9 % IV SOLN
12.5000 mg | INTRAVENOUS | Status: DC
  Administered 2020-06-13: 12.5 mg via INTRAVENOUS
  Filled 2020-06-13: qty 0.5

## 2020-06-14 ENCOUNTER — Encounter (HOSPITAL_COMMUNITY)
Admission: RE | Admit: 2020-06-14 | Discharge: 2020-06-14 | Disposition: A | Discharge status: Home / Self Care | Payer: Medicaid Other | Source: Ambulatory Visit | Attending: Student | Admitting: Student

## 2020-06-14 ENCOUNTER — Other Ambulatory Visit: Payer: Self-pay

## 2020-06-14 DIAGNOSIS — O210 Mild hyperemesis gravidarum: Secondary | ICD-10-CM | POA: Diagnosis not present

## 2020-06-14 DIAGNOSIS — Z3A16 16 weeks gestation of pregnancy: Secondary | ICD-10-CM | POA: Diagnosis not present

## 2020-06-14 MED ORDER — SODIUM CHLORIDE 0.9 % IV SOLN
12.5000 mg | INTRAVENOUS | Status: DC
  Administered 2020-06-14: 12.5 mg via INTRAVENOUS
  Filled 2020-06-14: qty 0.5

## 2020-06-16 ENCOUNTER — Encounter (HOSPITAL_COMMUNITY)
Admission: RE | Admit: 2020-06-16 | Discharge: 2020-06-16 | Disposition: A | Discharge status: Home / Self Care | Payer: Medicaid Other | Source: Ambulatory Visit | Attending: Student | Admitting: Student

## 2020-06-16 DIAGNOSIS — O210 Mild hyperemesis gravidarum: Secondary | ICD-10-CM | POA: Diagnosis not present

## 2020-06-16 DIAGNOSIS — Z3A16 16 weeks gestation of pregnancy: Secondary | ICD-10-CM | POA: Diagnosis not present

## 2020-06-16 MED ORDER — SODIUM CHLORIDE 0.9 % IV SOLN
12.5000 mg | INTRAVENOUS | Status: DC
  Administered 2020-06-16: 12.5 mg via INTRAVENOUS
  Filled 2020-06-16: qty 0.5

## 2020-06-19 ENCOUNTER — Inpatient Hospital Stay (HOSPITAL_COMMUNITY)
Admission: AD | Admit: 2020-06-19 | Discharge: 2020-06-20 | Disposition: A | Discharge status: Home / Self Care | Payer: Medicaid Other | Attending: Obstetrics and Gynecology | Admitting: Obstetrics and Gynecology

## 2020-06-19 ENCOUNTER — Other Ambulatory Visit: Payer: Self-pay

## 2020-06-19 ENCOUNTER — Encounter (HOSPITAL_COMMUNITY): Payer: Self-pay | Admitting: Obstetrics and Gynecology

## 2020-06-19 ENCOUNTER — Encounter (HOSPITAL_COMMUNITY)
Admission: RE | Admit: 2020-06-19 | Discharge: 2020-06-19 | Disposition: A | Discharge status: Home / Self Care | Payer: Medicaid Other | Source: Ambulatory Visit | Attending: Student | Admitting: Student

## 2020-06-19 DIAGNOSIS — R109 Unspecified abdominal pain: Secondary | ICD-10-CM | POA: Insufficient documentation

## 2020-06-19 DIAGNOSIS — O26892 Other specified pregnancy related conditions, second trimester: Secondary | ICD-10-CM | POA: Insufficient documentation

## 2020-06-19 DIAGNOSIS — R252 Cramp and spasm: Secondary | ICD-10-CM

## 2020-06-19 DIAGNOSIS — O210 Mild hyperemesis gravidarum: Secondary | ICD-10-CM | POA: Diagnosis not present

## 2020-06-19 DIAGNOSIS — Z3A19 19 weeks gestation of pregnancy: Secondary | ICD-10-CM | POA: Insufficient documentation

## 2020-06-19 DIAGNOSIS — Z3A16 16 weeks gestation of pregnancy: Secondary | ICD-10-CM | POA: Diagnosis not present

## 2020-06-19 MED ORDER — SODIUM CHLORIDE 0.9 % IV SOLN
12.5000 mg | INTRAVENOUS | Status: DC
  Administered 2020-06-19: 12.5 mg via INTRAVENOUS
  Filled 2020-06-19: qty 0.5

## 2020-06-19 NOTE — MAU Provider Note (Signed)
None     Chief Complaint:  Abdominal Pain    Leah [name] is  24 y.o. G2P1001 at 48w6dpresents complaining of Abdominal Pain  She c/o lower abdominal and thigh cramping. Called nurse to see if she could take an epsom salt bath, told to come in to get checked out. Has HG, got IV infusion today. Came in to MAU w/infiltrated IV; removed.  Has appt in am for another infusion. PNV still make her sick.  .    Obstetrical/Gynecological History: OB History    Gravida  2   Para  1   Term  1   Preterm  0   AB  0   Living  1     SAB  0   TAB  0   Ectopic  0   Multiple      Live Births  1          Past Medical History: Past Medical History:  Diagnosis Date  . Anemia 2013  . B[place] 1 disorder ([place]   . Gall stones   . GERD (gastroesophageal reflux disease)   . Seizure ([facility] 2011   pseudoseizures    Past Surgical History: Past Surgical History:  Procedure Laterality Date  . CHOLECYSTECTOMY N/A 09/28/2014   Procedure: LAPAROSCOPIC CHOLECYSTECTOMY;  Surgeon: ARalene Ok MD;  [place]  Service: General;  Laterality: N/A;  . CHOLECYSTECTOMY    . DIRECT LARYNGOSCOPY N/A 04/24/2014   Procedure: DIRECT LARYNGOSCOPY;  Surgeon: SAscencion Dike MD;  [place]  Service: ENT;  Laterality: N/A;  . FOREIGN BODY REMOVAL ESOPHAGEAL N/A 04/24/2014   Procedure: REMOVAL FOREIGN BODY ESOPHAGEAL;  Surgeon: SAscencion Dike MD;  [facility]  Service: ENT;  Laterality: N/A;  . WISDOM TOOTH EXTRACTION  2013    Family History: Family History  Problem Relation Age of Onset  . Arthritis Mother   . Bronchitis Mother   . Asthma Mother   . Hearing loss Paternal Grandfather   . Diabetes Neg Hx   . Stomach cancer Neg Hx   . Colon cancer Neg Hx     Social History: Social History   Tobacco Use  . Smoking status: Never Smoker  . Smokeless tobacco: Never Used  . Tobacco comment: marjiuna  Vaping Use  . Vaping Use: Never used  Substance Use Topics  . Alcohol use: Not  Currently    Alcohol/week: 6.0 standard drinks    Types: 6 Cans of beer per week  . Drug use: Yes    Types: Marijuana    Comment: last used before pregnancy    Allergies:  Allergies  Allergen Reactions  . Bactrim [Sulfamethoxazole-Trimethoprim] Rash    Meds:  Medications Prior to Admission  Medication Sig Dispense Refill Last Dose  . ondansetron (ZOFRAN ODT) 8 MG disintegrating tablet Take 1 tablet (8 mg total) by mouth every 8 (eight) hours as needed for nausea or vomiting. 40 tablet 0 06/18/2020 at Unknown time  . polyethylene glycol (MIRALAX) 17 g packet Take 17 g by mouth daily. 14 each 0 Past Week at Unknown time  . Prenatal Vit-Fe Fumarate-FA (PRENATAL MULTIVITAMIN) TABS tablet Take 1 tablet by mouth daily at 12 noon.   Past Month at Unknown time  . promethazine (PHENERGAN) 25 MG tablet Take 1 tablet (25 mg total) by mouth every 6 (six) hours as needed for nausea or vomiting. 30 tablet 0 06/18/2020 at Unknown time  . promethazine in sodium chloride 0.9 % 1,000 mL Please  infuse 1 L NS with 25 mg of phenergan added and infused in over two hours IV. Please repeat 4 times weekly for the next  four weeks. 1 Bag 16 06/19/2020 at Unknown time  . scopolamine (TRANSDERM-SCOP) 1 MG/3DAYS Place 1 patch (1.5 mg total) onto the skin every 3 (three) days. 10 patch 2 Past Month at Unknown time  . Blood Pressure Monitoring (BLOOD PRESSURE KIT) DEVI 1 Device by Does not apply route as needed. 1 each 0   . famotidine (PEPCID) 20 MG tablet Take 1 tablet (20 mg total) by mouth 2 (two) times daily. 30 tablet 0 More than a month at Unknown time  . prochlorperazine (COMPAZINE) 10 MG tablet Take 1 tablet (10 mg total) by mouth every 6 (six) hours as needed for nausea or vomiting. 30 tablet 1     Review of Systems   Constitutional: Negative for fever and chills Eyes: Negative for visual disturbances Respiratory: Negative for shortness of breath, dyspnea Cardiovascular: Negative for chest pain or  palpitations  Gastrointestinal: Negative for diarrhea and constipation.  Vomiting has been 'under control" today. Genitourinary: Negative for dysuria and urgency Musculoskeletal: Negative for back pain, + for leg/lower abdominal cramps  Normal ROM  Neurological: Negative for dizziness and headaches    Physical Exam  Blood pressure 116/67, pulse 90, temperature 98.5 F (36.9 C), temperature source Oral, resp. rate 15, weight 62.7 kg, last menstrual period 02/01/2020, SpO2 100 %. GENERAL: Well-developed, well-nourished female in no acute distress.  LUNGS: Normal respiratory effort HEART: Regular rate and rhythm. ABDOMEN: Soft, nontender, nondistended, gravid.  EXTREMITIES: Nontender, no edema, 2+ distal pulses. DTR's 2+ CERVICAL EXAM: Dilatation 0cm   Effacement 0%      FHT:  142 doppler, fetal movement heard    Labs: Results for orders placed or performed during the hospital encounter of 06/19/20 (from the past 24 hour(s))  Urinalysis, Routine w reflex microscopic   Collection Time: 06/20/20 12:11 AM  Result Value Ref Range   Color, Urine YELLOW YELLOW   APPearance CLEAR CLEAR   Specific Gravity, Urine 1.023 1.005 - 1.030   pH 6.0 5.0 - 8.0   Glucose, UA NEGATIVE NEGATIVE mg/dL   Hgb urine dipstick NEGATIVE NEGATIVE   Bilirubin Urine NEGATIVE NEGATIVE   Ketones, ur NEGATIVE NEGATIVE mg/dL   Protein, ur NEGATIVE NEGATIVE mg/dL   Nitrite NEGATIVE NEGATIVE   Leukocytes,Ua NEGATIVE NEGATIVE   Imaging Studies:    Assessment: [place] is  24 y.o. G2P1001 at 64w6dpresents with muscle cramping.  Plan: Tips on dealing w/cramps given in writing and orally Keep appt at infusion center in the am OK to take epsom salt bath  [name]11/30/202112:55 AM

## 2020-06-19 NOTE — MAU Note (Signed)
.   Leah [name] is a 24 y.o. at [redacted]w[redacted]d here in MAU reporting: lower abdominal cramping that started this morning. No VB or LOF. Patient also receives infusions at the clinic and her IV has came out. She has another appt for an infusion in the morning.   Pain score: 8 Vitals:   06/19/20 2319  BP: 116/67  Pulse: 90  Resp: 15  Temp: 98.5 F (36.9 C)  SpO2: 100%     FHT:143 Lab orders placed from triage: UA

## 2020-06-19 NOTE — MAU Note (Signed)
IV R hand flushed with 59ml NS -pt reported severe burning-dried blood noted at insertion site. IV R hand discontinued. No signs of infection, swelling or streaking.

## 2020-06-20 ENCOUNTER — Encounter (HOSPITAL_COMMUNITY)
Admission: RE | Admit: 2020-06-20 | Discharge: 2020-06-20 | Disposition: A | Discharge status: Home / Self Care | Payer: Medicaid Other | Source: Ambulatory Visit | Attending: Student | Admitting: Student

## 2020-06-20 DIAGNOSIS — Z3A19 19 weeks gestation of pregnancy: Secondary | ICD-10-CM

## 2020-06-20 DIAGNOSIS — R252 Cramp and spasm: Secondary | ICD-10-CM

## 2020-06-20 DIAGNOSIS — Z3A16 16 weeks gestation of pregnancy: Secondary | ICD-10-CM | POA: Diagnosis not present

## 2020-06-20 DIAGNOSIS — R109 Unspecified abdominal pain: Secondary | ICD-10-CM

## 2020-06-20 DIAGNOSIS — O210 Mild hyperemesis gravidarum: Secondary | ICD-10-CM | POA: Diagnosis not present

## 2020-06-20 DIAGNOSIS — O99891 Other specified diseases and conditions complicating pregnancy: Secondary | ICD-10-CM | POA: Diagnosis not present

## 2020-06-20 DIAGNOSIS — O26892 Other specified pregnancy related conditions, second trimester: Secondary | ICD-10-CM | POA: Diagnosis not present

## 2020-06-20 LAB — URINALYSIS, ROUTINE W REFLEX MICROSCOPIC
Bilirubin Urine: NEGATIVE
Glucose, UA: NEGATIVE mg/dL
Hgb urine dipstick: NEGATIVE
Ketones, ur: NEGATIVE mg/dL
Leukocytes,Ua: NEGATIVE
Nitrite: NEGATIVE
Protein, ur: NEGATIVE mg/dL
Specific Gravity, Urine: 1.023 (ref 1.005–1.030)
pH: 6 (ref 5.0–8.0)

## 2020-06-20 MED ORDER — SODIUM CHLORIDE 0.9 % IV SOLN
12.5000 mg | INTRAVENOUS | Status: DC
  Administered 2020-06-20: 12.5 mg via INTRAVENOUS
  Filled 2020-06-20: qty 0.5

## 2020-06-20 NOTE — Discharge Instructions (Signed)
Why am I having leg cramps during pregnancy? ° °No one really knows why pregnant women get more leg cramps. It's possible that your leg muscles are tired from carrying around all of your extra weight. Or they may be aggravated by the pressure your expanding uterus puts on the blood vessels that return blood from your legs to your heart and the nerves that lead from your trunk to your legs. ° °Leg cramps may start to plague you during your second trimester and may get worse as your pregnancy progresses and your belly gets bigger. While these cramps can occur during the day, you'll probably notice them most at night, when they can interfere with your ability to get a good night's sleep. ° °How can I prevent leg cramps? ° °Try these tips for keeping leg cramps at bay: ° °Avoid standing or sitting with your legs crossed for long periods of time. °Stretch your calf muscles regularly during the day and several times before you go to bed. °Rotate your ankles and wiggle your toes when you sit, eat dinner, or watch TV. °Take a walk every day, unless your midwife or doctor has advised you not to exercise. °Avoid getting too tired. Lie down on your left side to improve circulation to and from your legs. °Stay hydrated during the day by drinking water regularly. °Try a warm bath before bed to relax your muscles. °Some research suggests that taking a magnesium supplement in addition to a prenatal vitamin may help some women avoid leg cramps. However, other research showed that magnesium supplements had no significant effect on the frequency or intensity of leg cramps during pregnancy.You may have heard that having leg cramps is a sign that you need more calcium, and that calcium supplements will relieve the problem. Though it's certainly important to get enough calcium, there's no good evidence that taking extra calcium will help prevent leg cramps during pregnancy. In fact, in one well-designed study, pregnant women taking  calcium got no more relief from leg cramps than those taking a placebo. ° °You may try Chelated Magnesium at a dosage of 240-300mg/day. ° °What's the best way to relieve a cramp when I get one? ° °If you do get a cramp, immediately stretch your calf muscles: Straighten your leg, heel first, and gently flex your toes back toward your shins. It might hurt at first, but it will ease the spasm and the pain will gradually go away. ° °You can try to relax the cramp by massaging the muscle or warming it with a hot water bottle. Walking around for a few minutes may help too. ° °What if the pain persists? ° °Call your practitioner if your muscle pain is constant and not just an occasional cramp or if you notice swelling, redness, or tenderness in your leg, or the area feels warm to your touch. These may be signs of a blood clot, which requires immediate medical attention. Blood clots are relatively rare, but they're more common during pregnancy. °  °  ° Tips to Help Leg Cramps °Increase dietary sources of calcium (milk, yogurt, cheese, leafy greens, seafood, legumes, and fruit) and magnesium (dark leafy greens, nuts, seeds, fish, beans, whole grains, avocados, yogurt, bananas, dried fruit, dark chocolate) °Spoonful of regular yellow mustard every night °Pickle juice °Magnesium supplement: 5mmol in the morning, 10mmol at night (can find in the vitamin aisle) °Dorsiflexion of foot: pointing your toes back towards your knee during the cramp °  ° °

## 2020-06-21 ENCOUNTER — Other Ambulatory Visit: Payer: Self-pay

## 2020-06-21 ENCOUNTER — Ambulatory Visit (INDEPENDENT_AMBULATORY_CARE_PROVIDER_SITE_OTHER): Payer: Medicaid Other | Admitting: Neurology

## 2020-06-21 DIAGNOSIS — R569 Unspecified convulsions: Secondary | ICD-10-CM | POA: Diagnosis not present

## 2020-06-22 ENCOUNTER — Encounter (HOSPITAL_COMMUNITY)
Admission: RE | Admit: 2020-06-22 | Discharge: 2020-06-22 | Disposition: A | Discharge status: Home / Self Care | Payer: Medicaid Other | Source: Ambulatory Visit | Attending: Student | Admitting: Student

## 2020-06-22 ENCOUNTER — Other Ambulatory Visit: Payer: Self-pay | Admitting: Student

## 2020-06-22 DIAGNOSIS — O210 Mild hyperemesis gravidarum: Secondary | ICD-10-CM | POA: Insufficient documentation

## 2020-06-22 MED ORDER — SODIUM CHLORIDE 0.9 % IV SOLN
12.5000 mg | INTRAVENOUS | Status: DC
  Administered 2020-06-22: 12.5 mg via INTRAVENOUS
  Filled 2020-06-22: qty 0.5

## 2020-06-23 ENCOUNTER — Other Ambulatory Visit: Payer: Self-pay

## 2020-06-23 ENCOUNTER — Encounter (HOSPITAL_COMMUNITY)
Admission: RE | Admit: 2020-06-23 | Discharge: 2020-06-23 | Disposition: A | Discharge status: Home / Self Care | Payer: Medicaid Other | Source: Ambulatory Visit | Attending: Student | Admitting: Student

## 2020-06-23 DIAGNOSIS — O210 Mild hyperemesis gravidarum: Secondary | ICD-10-CM | POA: Diagnosis not present

## 2020-06-23 MED ORDER — SODIUM CHLORIDE 0.9 % IV SOLN
12.5000 mg | INTRAVENOUS | Status: DC
  Administered 2020-06-23: 12.5 mg via INTRAVENOUS
  Filled 2020-06-23: qty 0.5

## 2020-06-23 NOTE — Procedures (Signed)
TECHNICAL SUMMARY:  A multichannel referential and bipolar montage EEG using the standard international 10-20 system was performed on the patient described as awake, drowsy and asleep.  The dominant background activity consists of 9 hertz activity seen most prominantly over the posterior head region.  The backgound activity is reactive to eye opening and closing procedures.  Low voltage fast (beta) activity is distributed symmetrically and maximally over the anterior head regions.  ACTIVATION:  Stepwise photic stimulation at 4-20 flashes per second was performed and did not elicit any abnormal waveforms.  Hyperventilation was not performed.  EPILEPTIFORM ACTIVITY:  There were no spikes, sharp waves or paroxysmal activity.  SLEEP: Both stage I and stage II sleep were identified.  CARDIAC:  The EKG lead revealed a regular sinus rhythm.  IMPRESSION:  This is a normal EEG for the patients stated age.  There were no focal, hemispheric or lateralizing features.  No epileptiform activity was recorded.  A normal EEG does not exclude the diagnosis of a seizure disorder and if seizure remains high on the list of differential diagnosis, an ambulatory EEG may be of value.  Clinical correlation is required.

## 2020-06-26 ENCOUNTER — Encounter (HOSPITAL_COMMUNITY)
Admission: RE | Admit: 2020-06-26 | Discharge: 2020-06-26 | Disposition: A | Discharge status: Home / Self Care | Payer: Medicaid Other | Source: Ambulatory Visit | Attending: Student | Admitting: Student

## 2020-06-26 ENCOUNTER — Other Ambulatory Visit: Payer: Self-pay

## 2020-06-26 DIAGNOSIS — O210 Mild hyperemesis gravidarum: Secondary | ICD-10-CM | POA: Diagnosis not present

## 2020-06-26 MED ORDER — SODIUM CHLORIDE 0.9 % IV SOLN
12.5000 mg | INTRAVENOUS | Status: DC
  Administered 2020-06-26: 12.5 mg via INTRAVENOUS
  Filled 2020-06-26: qty 0.5

## 2020-06-27 ENCOUNTER — Ambulatory Visit (HOSPITAL_COMMUNITY): Payer: Medicaid Other

## 2020-06-27 DIAGNOSIS — F319 Bipolar disorder, unspecified: Secondary | ICD-10-CM | POA: Diagnosis not present

## 2020-06-28 ENCOUNTER — Other Ambulatory Visit: Payer: Self-pay

## 2020-06-28 ENCOUNTER — Ambulatory Visit (INDEPENDENT_AMBULATORY_CARE_PROVIDER_SITE_OTHER): Payer: Medicaid Other | Admitting: Obstetrics & Gynecology

## 2020-06-28 ENCOUNTER — Encounter: Payer: Self-pay | Admitting: Obstetrics & Gynecology

## 2020-06-28 ENCOUNTER — Other Ambulatory Visit (HOSPITAL_COMMUNITY): Payer: Self-pay | Admitting: *Deleted

## 2020-06-28 ENCOUNTER — Other Ambulatory Visit: Payer: Self-pay | Admitting: Student

## 2020-06-28 VITALS — BP 115/71 | HR 94 | Wt 138.8 lb

## 2020-06-28 DIAGNOSIS — O0990 Supervision of high risk pregnancy, unspecified, unspecified trimester: Secondary | ICD-10-CM | POA: Diagnosis not present

## 2020-06-28 DIAGNOSIS — O210 Mild hyperemesis gravidarum: Secondary | ICD-10-CM

## 2020-06-28 DIAGNOSIS — F319 Bipolar disorder, unspecified: Secondary | ICD-10-CM

## 2020-06-28 MED ORDER — PANTOPRAZOLE SODIUM 20 MG PO TBEC: 20.0000 mg | DELAYED_RELEASE_TABLET | Freq: Every day | ORAL | 2 refills | Status: DC

## 2020-06-28 MED ORDER — METOCLOPRAMIDE HCL 10 MG PO TABS: 10.0000 mg | ORAL_TABLET | Freq: Three times a day (TID) | ORAL | 2 refills | Status: DC

## 2020-06-28 MED ORDER — PROMETHAZINE HCL 25 MG/ML IJ SOLN
INTRAMUSCULAR | 16 refills | Status: DC
Start: 2020-06-28 — End: 2020-07-14

## 2020-06-28 MED ORDER — SCOPOLAMINE 1 MG/3DAYS TD PT72: 1.0000 | MEDICATED_PATCH | TRANSDERMAL | 2 refills | Status: DC

## 2020-06-28 NOTE — Patient Instructions (Signed)
Morning Sickness  Morning sickness is when a woman feels nauseous during pregnancy. This nauseous feeling may or may not come with vomiting. It often occurs in the morning, but it can be a problem at any time of day. Morning sickness is most common during the first trimester. In some cases, it may continue throughout pregnancy. Although morning sickness is unpleasant, it is usually harmless unless the woman develops severe and continual vomiting (hyperemesis gravidarum), a condition that requires more intense treatment. What are the causes? The exact cause of this condition is not known, but it seems to be related to normal hormonal changes that occur in pregnancy. What increases the risk? You are more likely to develop this condition if:  You experienced nausea or vomiting before your pregnancy.  You had morning sickness during a previous pregnancy.  You are pregnant with more than one baby, such as twins. What are the signs or symptoms? Symptoms of this condition include:  Nausea.  Vomiting. How is this diagnosed? This condition is usually diagnosed based on your signs and symptoms. How is this treated? In many cases, treatment is not needed for this condition. Making some changes to what you eat may help to control symptoms. Your health care provider may also prescribe or recommend:  Vitamin B6 supplements.  Anti-nausea medicines.  Ginger. Follow these instructions at home: Medicines  Take over-the-counter and prescription medicines only as told by your health care provider. Do not use any prescription, over-the-counter, or herbal medicines for morning sickness without first talking with your health care provider.  Taking multivitamins before getting pregnant can prevent or decrease the severity of morning sickness in most women. Eating and drinking  Eat a piece of dry toast or crackers before getting out of bed in the morning.  Eat 5 or 6 small meals a day.  Eat dry and  bland foods, such as rice or a baked potato. Foods that are high in carbohydrates are often helpful.  Avoid greasy, fatty, and spicy foods.  Have someone cook for you if the smell of any food causes nausea and vomiting.  If you feel nauseous after taking prenatal vitamins, take the vitamins at night or with a snack.  Snack on protein foods between meals if you are hungry. Nuts, yogurt, and cheese are good options.  Drink fluids throughout the day.  Try ginger ale made with real ginger, ginger tea made from fresh grated ginger, or ginger candies. General instructions  Do not use any products that contain nicotine or tobacco, such as cigarettes and e-cigarettes. If you need help quitting, ask your health care provider.  Get an air purifier to keep the air in your house free of odors.  Get plenty of fresh air.  Try to avoid odors that trigger your nausea.  Consider trying these methods to help relieve symptoms: ? Wearing an acupressure wristband. These wristbands are often worn for seasickness. ? Acupuncture. Contact a health care provider if:  Your home remedies are not working and you need medicine.  You feel dizzy or light-headed.  You are losing weight. Get help right away if:  You have persistent and uncontrolled nausea and vomiting.  You faint.  You have severe pain in your abdomen. Summary  Morning sickness is when a woman feels nauseous during pregnancy. This nauseous feeling may or may not come with vomiting.  Morning sickness is most common during the first trimester.  It often occurs in the morning, but it can be a problem at   any time of day.  In many cases, treatment is not needed for this condition. Making some changes to what you eat may help to control symptoms. This information is not intended to replace advice given to you by your health care provider. Make sure you discuss any questions you have with your health care provider. Document Revised:  06/20/2017 Document Reviewed: 08/10/2016 Elsevier Patient Education  2020 [employer]  

## 2020-06-28 NOTE — Progress Notes (Signed)
   PRENATAL VISIT NOTE  Subjective:  Leah [name] is a 24 y.o. G2P1001 at [redacted]w[redacted]d being seen today for ongoing prenatal care.  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  Patient reports nausea, vomiting and unable to stand as needed at work.  Contractions: Not present. Vag. Bleeding: None.  Movement: Absent. Denies leaking of fluid.   The following portions of the patient's history were reviewed and updated as appropriate: allergies, current medications, past family history, past medical history, past social history, past surgical history and problem list.   Objective:   Vitals:   06/28/20 0909  BP: 115/71  Pulse: 94  Weight: 138 lb 12.8 oz (63 kg)    Fetal Status: Fetal Heart Rate (bpm): 142   Movement: Absent     General:  Alert, oriented and cooperative. Patient is in no acute distress.  Skin: Skin is warm and dry. No rash noted.   Cardiovascular: Normal heart rate noted  Respiratory: Normal respiratory effort, no problems with respiration noted  Abdomen: Soft, gravid, appropriate for gestational age.  Pain/Pressure: Absent     Pelvic: Cervical exam deferred        Extremities: Normal range of motion.  Edema: None  Mental Status: Normal mood and affect. Normal behavior. Normal judgment and thought content.   Assessment and Plan:  Pregnancy: G2P1001 at [redacted]w[redacted]d 1. Supervision of high risk pregnancy, antepartum screening - AFP, Serum, Open Spina Bifida - scopolamine (TRANSDERM-SCOP) 1 MG/3DAYS; Place 1 patch (1.5 mg total) onto the skin  every 3 (three) days.  Dispense: 10 patch; Refill: 2  2. Hyperemesis gravidarum Received infusions, Zofran and phenergan not helping, try different tx - metoCLOPramide (REGLAN) 10 MG tablet; Take 1 tablet (10 mg total) by mouth 4 (four) times daily -  before meals and at bedtime.  Dispense: 90 tablet; Refill: 2 - pantoprazole (PROTONIX) 20 MG tablet; Take 1 tablet (20 mg total) by mouth daily.  Dispense: 30 tablet; Refill: 2 Will come out of her job at [employer] 3. Bipolar 1 disorder (HCC)   Preterm labor symptoms and general obstetric precautions including but not limited to vaginal bleeding, contractions, leaking of fluid and fetal movement were reviewed in detail with the patient. Please refer to After Visit Summary for other counseling recommendations.   Return in about 2 weeks (around 07/12/2020) for has papers to be out of her job until further notice needs note will give fax no..  Future Appointments  Date Time Provider Department Center  06/29/2020  8:00 AM [facility] None  06/30/2020  8:00 AM [facility] None  07/07/2020  7:45 AM [facility] NURSE [facility]  07/07/2020  8:00 AM [facility] US1 [facility]  07/07/2020 10:35 AM Crissie Reese, Mary Sella, MD [facility]    Scheryl Darter, MD

## 2020-06-29 ENCOUNTER — Encounter (HOSPITAL_COMMUNITY)
Admission: RE | Admit: 2020-06-29 | Discharge: 2020-06-29 | Disposition: A | Discharge status: Home / Self Care | Payer: Medicaid Other | Source: Ambulatory Visit | Attending: Student | Admitting: Student

## 2020-06-29 DIAGNOSIS — O210 Mild hyperemesis gravidarum: Secondary | ICD-10-CM | POA: Diagnosis not present

## 2020-06-29 MED ORDER — SODIUM CHLORIDE 0.9 % IV SOLN
12.5000 mg | INTRAVENOUS | Status: DC
  Administered 2020-06-29: 12.5 mg via INTRAVENOUS
  Filled 2020-06-29: qty 0.5

## 2020-06-30 ENCOUNTER — Encounter (HOSPITAL_COMMUNITY)
Admission: RE | Admit: 2020-06-30 | Discharge: 2020-06-30 | Disposition: A | Discharge status: Home / Self Care | Payer: Medicaid Other | Source: Ambulatory Visit | Attending: Student | Admitting: Student

## 2020-06-30 ENCOUNTER — Other Ambulatory Visit: Payer: Self-pay

## 2020-06-30 DIAGNOSIS — O210 Mild hyperemesis gravidarum: Secondary | ICD-10-CM | POA: Diagnosis not present

## 2020-06-30 LAB — AFP, SERUM, OPEN SPINA BIFIDA
AFP MoM: 1.14
AFP Value: 87.6 ng/mL
Gest. Age on Collection Date: 21.1 weeks
Maternal Age At EDD: 26 yr
OSBR Risk 1 IN: 10000
Test Results:: NEGATIVE
Weight: 139 [lb_av]

## 2020-06-30 MED ORDER — SODIUM CHLORIDE 0.9 % IV SOLN
12.5000 mg | INTRAVENOUS | Status: DC
  Administered 2020-06-30: 12.5 mg via INTRAVENOUS
  Filled 2020-06-30: qty 0.5

## 2020-07-03 ENCOUNTER — Encounter (HOSPITAL_COMMUNITY)
Admission: RE | Admit: 2020-07-03 | Discharge: 2020-07-03 | Disposition: A | Discharge status: Home / Self Care | Payer: Medicaid Other | Source: Ambulatory Visit | Attending: Student | Admitting: Student

## 2020-07-03 ENCOUNTER — Other Ambulatory Visit: Payer: Self-pay

## 2020-07-03 DIAGNOSIS — O210 Mild hyperemesis gravidarum: Secondary | ICD-10-CM | POA: Diagnosis not present

## 2020-07-03 MED ORDER — SODIUM CHLORIDE 0.9 % IV SOLN
12.5000 mg | INTRAVENOUS | Status: DC
  Administered 2020-07-03: 09:00:00 12.5 mg via INTRAVENOUS
  Filled 2020-07-03: qty 0.5

## 2020-07-04 ENCOUNTER — Other Ambulatory Visit: Payer: Self-pay

## 2020-07-04 ENCOUNTER — Encounter (HOSPITAL_COMMUNITY)
Admission: RE | Admit: 2020-07-04 | Discharge: 2020-07-04 | Disposition: A | Discharge status: Home / Self Care | Payer: Medicaid Other | Source: Ambulatory Visit | Attending: Student | Admitting: Student

## 2020-07-04 DIAGNOSIS — O210 Mild hyperemesis gravidarum: Secondary | ICD-10-CM | POA: Diagnosis not present

## 2020-07-04 MED ORDER — SODIUM CHLORIDE 0.9 % IV SOLN
12.5000 mg | INTRAVENOUS | Status: DC
  Administered 2020-07-04: 09:00:00 12.5 mg via INTRAVENOUS
  Filled 2020-07-04: qty 0.5

## 2020-07-05 ENCOUNTER — Encounter (HOSPITAL_COMMUNITY)
Admission: RE | Admit: 2020-07-05 | Discharge: 2020-07-05 | Disposition: A | Discharge status: Home / Self Care | Payer: Medicaid Other | Source: Ambulatory Visit | Attending: Student | Admitting: Student

## 2020-07-05 ENCOUNTER — Other Ambulatory Visit: Payer: Self-pay

## 2020-07-05 DIAGNOSIS — O210 Mild hyperemesis gravidarum: Secondary | ICD-10-CM | POA: Diagnosis not present

## 2020-07-05 MED ORDER — SODIUM CHLORIDE 0.9 % IV SOLN
12.5000 mg | INTRAVENOUS | Status: DC
  Administered 2020-07-05: 08:00:00 12.5 mg via INTRAVENOUS
  Filled 2020-07-05: qty 0.5

## 2020-07-06 ENCOUNTER — Other Ambulatory Visit: Payer: Self-pay

## 2020-07-06 ENCOUNTER — Encounter (HOSPITAL_COMMUNITY)
Admission: RE | Admit: 2020-07-06 | Discharge: 2020-07-06 | Disposition: A | Discharge status: Home / Self Care | Payer: Medicaid Other | Source: Ambulatory Visit | Attending: Student | Admitting: Student

## 2020-07-06 DIAGNOSIS — O210 Mild hyperemesis gravidarum: Secondary | ICD-10-CM | POA: Diagnosis not present

## 2020-07-06 MED ORDER — SODIUM CHLORIDE 0.9 % IV SOLN
12.5000 mg | INTRAVENOUS | Status: DC
  Administered 2020-07-06: 08:00:00 12.5 mg via INTRAVENOUS
  Filled 2020-07-06: qty 0.5

## 2020-07-07 ENCOUNTER — Telehealth (INDEPENDENT_AMBULATORY_CARE_PROVIDER_SITE_OTHER): Payer: Medicaid Other | Admitting: Family Medicine

## 2020-07-07 ENCOUNTER — Ambulatory Visit: Payer: Medicaid Other | Attending: Obstetrics and Gynecology

## 2020-07-07 ENCOUNTER — Ambulatory Visit: Payer: Medicaid Other | Admitting: *Deleted

## 2020-07-07 ENCOUNTER — Encounter: Payer: Self-pay | Admitting: *Deleted

## 2020-07-07 ENCOUNTER — Other Ambulatory Visit: Payer: Self-pay | Admitting: *Deleted

## 2020-07-07 ENCOUNTER — Telehealth: Payer: Medicaid Other | Admitting: Certified Nurse Midwife

## 2020-07-07 ENCOUNTER — Other Ambulatory Visit: Payer: Self-pay

## 2020-07-07 DIAGNOSIS — R569 Unspecified convulsions: Secondary | ICD-10-CM

## 2020-07-07 DIAGNOSIS — O210 Mild hyperemesis gravidarum: Secondary | ICD-10-CM

## 2020-07-07 DIAGNOSIS — Z3A22 22 weeks gestation of pregnancy: Secondary | ICD-10-CM

## 2020-07-07 DIAGNOSIS — O99342 Other mental disorders complicating pregnancy, second trimester: Secondary | ICD-10-CM

## 2020-07-07 DIAGNOSIS — F319 Bipolar disorder, unspecified: Secondary | ICD-10-CM

## 2020-07-07 DIAGNOSIS — O0990 Supervision of high risk pregnancy, unspecified, unspecified trimester: Secondary | ICD-10-CM | POA: Insufficient documentation

## 2020-07-07 DIAGNOSIS — O283 Abnormal ultrasonic finding on antenatal screening of mother: Secondary | ICD-10-CM | POA: Insufficient documentation

## 2020-07-07 DIAGNOSIS — Z8669 Personal history of other diseases of the nervous system and sense organs: Secondary | ICD-10-CM

## 2020-07-07 DIAGNOSIS — Z362 Encounter for other antenatal screening follow-up: Secondary | ICD-10-CM

## 2020-07-07 NOTE — Progress Notes (Signed)
I connected with Leah [name] 07/07/20 at 10:35 AM EST by: telephone and verified that I am speaking with the correct person using two identifiers.  Patient is located at home and provider is located at [facility].     The purpose of this virtual visit is to provide medical care while limiting exposure to the novel coronavirus. I discussed the limitations, risks, security and privacy concerns of performing an evaluation and management service by MyChart video and the availability of in person appointments. I also discussed with the patient that there may be a patient responsible charge related to this service. By engaging in this virtual visit, you consent to the provision of healthcare.  Additionally, you authorize for your insurance to be billed for the services provided during this visit.  The patient expressed understanding and agreed to proceed.  The following staff members participated in the virtual visit:  Venora Maples, MD    PRENATAL VISIT NOTE  Subjective:  Leah [name] is a 24 y.o. G2P1001 at [redacted]w[redacted]d  for phone visit for ongoing prenatal care.  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  Patient reports nausea.   .  .   . Denies leaking of fluid.  BP  The following portions of the patient's history were reviewed and updated as appropriate: allergies, current medications, past family history, past medical history, past social history, past surgical history and problem list.   Objective:  There were no vitals filed for this  visit. Self-Obtained  Fetal Status:           Assessment and Plan:  Pregnancy: G2P1001 at [redacted]w[redacted]d 1. Supervision of high risk pregnancy, antepartum BP at infusion clinic has been normal Follow up growth [place] today was normal, f/u in 6 weeks per MFM Would like to go out on leave, reports she has an informal leave arrangement with work and asked me to call and approve, does not think its FMLA I called and spoke with leave manager at [phone], ext [phone] Juluis Mire, she reported this is in fact for [employer] again with Leah to reiterate that this was in fact FMLA and would be followed by ADA leave which does not offer same legal protections She understood and would still like to proceed Dance movement psychotherapist called back and notified  2. Bipolar 1 disorder (HCC) Mood is OK off zyprexa Has therapist  3. Hyperemesis gravidarum Going to infusion center for treatment Reports her symptoms are stable  4. 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  Return in 4 weeks (on 08/04/2020).  Future Appointments  Date Time Provider Department Center  07/10/2020  8:00 AM [facility] None  07/11/2020  8:00 AM [facility] None  07/12/2020  8:00 AM [facility] None  07/13/2020  8:00 AM [facility] None  08/18/2020  9:45 AM [facility] US5 [facility]     Time spent on virtual visit:  20 minutes  Venora Maples, MD

## 2020-07-07 NOTE — Patient Instructions (Signed)
 Second Trimester of Pregnancy The second trimester is from week 14 through week 27 (months 4 through 6). The second trimester is often a time when you feel your best. Your body has adjusted to being pregnant, and you begin to feel better physically. Usually, morning sickness has lessened or quit completely, you may have more energy, and you may have an increase in appetite. The second trimester is also a time when the fetus is growing rapidly. At the end of the sixth month, the fetus is about 9 inches long and weighs about 1 pounds. You will likely begin to feel the baby move (quickening) between 16 and 20 weeks of pregnancy. Body changes during your second trimester Your body continues to go through many changes during your second trimester. The changes vary from woman to woman.  Your weight will continue to increase. You will notice your lower abdomen bulging out.  You may begin to get stretch marks on your hips, abdomen, and breasts.  You may develop headaches that can be relieved by medicines. The medicines should be approved by your health care provider.  You may urinate more often because the fetus is pressing on your bladder.  You may develop or continue to have heartburn as a result of your pregnancy.  You may develop constipation because certain hormones are causing the muscles that push waste through your intestines to slow down.  You may develop hemorrhoids or swollen, bulging veins (varicose veins).  You may have back pain. This is caused by: ? Weight gain. ? Pregnancy hormones that are relaxing the joints in your pelvis. ? A shift in weight and the muscles that support your balance.  Your breasts will continue to grow and they will continue to become tender.  Your gums may bleed and may be sensitive to brushing and flossing.  Dark spots or blotches (chloasma, mask of pregnancy) may develop on your face. This will likely fade after the baby is born.  A dark line from  your belly button to the pubic area (linea nigra) may appear. This will likely fade after the baby is born.  You may have changes in your hair. These can include thickening of your hair, rapid growth, and changes in texture. Some women also have hair loss during or after pregnancy, or hair that feels dry or thin. Your hair will most likely return to normal after your baby is born. What to expect at prenatal visits During a routine prenatal visit:  You will be weighed to make sure you and the fetus are growing normally.  Your blood pressure will be taken.  Your abdomen will be measured to track your baby's growth.  The fetal heartbeat will be listened to.  Any test results from the previous visit will be discussed. Your health care provider may ask you:  How you are feeling.  If you are feeling the baby move.  If you have had any abnormal symptoms, such as leaking fluid, bleeding, severe headaches, or abdominal cramping.  If you are using any tobacco products, including cigarettes, chewing tobacco, and electronic cigarettes.  If you have any questions. Other tests that may be performed during your second trimester include:  Blood tests that check for: ? Low iron levels (anemia). ? High blood sugar that affects pregnant women (gestational diabetes) between 24 and 28 weeks. ? Rh antibodies. This is to check for a protein on red blood cells (Rh factor).  Urine tests to check for infections, diabetes, or protein in   the urine.  An ultrasound to confirm the proper growth and development of the baby.  An amniocentesis to check for possible genetic problems.  Fetal screens for spina bifida and Down syndrome.  HIV (human immunodeficiency virus) testing. Routine prenatal testing includes screening for HIV, unless you choose not to have this test. Follow these instructions at home: Medicines  Follow your health care provider's instructions regarding medicine use. Specific medicines  may be either safe or unsafe to take during pregnancy.  Take a prenatal vitamin that contains at least 600 micrograms (mcg) of folic acid.  If you develop constipation, try taking a stool softener if your health care provider approves. Eating and drinking   Eat a balanced diet that includes fresh fruits and vegetables, whole grains, good sources of protein such as meat, eggs, or tofu, and low-fat dairy. Your health care provider will help you determine the amount of weight gain that is right for you.  Avoid raw meat and uncooked cheese. These carry germs that can cause birth defects in the baby.  If you have low calcium intake from food, talk to your health care provider about whether you should take a daily calcium supplement.  Limit foods that are high in fat and processed sugars, such as fried and sweet foods.  To prevent constipation: ? Drink enough fluid to keep your urine clear or pale yellow. ? Eat foods that are high in fiber, such as fresh fruits and vegetables, whole grains, and beans. Activity  Exercise only as directed by your health care provider. Most women can continue their usual exercise routine during pregnancy. Try to exercise for 30 minutes at least 5 days a week. Stop exercising if you experience uterine contractions.  Avoid heavy lifting, wear low heel shoes, and practice good posture.  A sexual relationship may be continued unless your health care provider directs you otherwise. Relieving pain and discomfort  Wear a good support bra to prevent discomfort from breast tenderness.  Take warm sitz baths to soothe any pain or discomfort caused by hemorrhoids. Use hemorrhoid cream if your health care provider approves.  Rest with your legs elevated if you have leg cramps or low back pain.  If you develop varicose veins, wear support hose. Elevate your feet for 15 minutes, 3-4 times a day. Limit salt in your diet. Prenatal Care  Write down your questions. Take  them to your prenatal visits.  Keep all your prenatal visits as told by your health care provider. This is important. Safety  Wear your seat belt at all times when driving.  Make a list of emergency phone numbers, including numbers for family, friends, the hospital, and police and fire departments. General instructions  Ask your health care provider for a referral to a local prenatal education class. Begin classes no later than the beginning of month 6 of your pregnancy.  Ask for help if you have counseling or nutritional needs during pregnancy. Your health care provider can offer advice or refer you to specialists for help with various needs.  Do not use hot tubs, steam rooms, or saunas.  Do not douche or use tampons or scented sanitary pads.  Do not cross your legs for long periods of time.  Avoid cat litter boxes and soil used by cats. These carry germs that can cause birth defects in the baby and possibly loss of the fetus by miscarriage or stillbirth.  Avoid all smoking, herbs, alcohol, and unprescribed drugs. Chemicals in these products can affect the   formation and growth of the baby.  Do not use any products that contain nicotine or tobacco, such as cigarettes and e-cigarettes. If you need help quitting, ask your health care provider.  Visit your dentist if you have not gone yet during your pregnancy. Use a soft toothbrush to brush your teeth and be gentle when you floss. Contact a health care provider if:  You have dizziness.  You have mild pelvic cramps, pelvic pressure, or nagging pain in the abdominal area.  You have persistent nausea, vomiting, or diarrhea.  You have a bad smelling vaginal discharge.  You have pain when you urinate. Get help right away if:  You have a fever.  You are leaking fluid from your vagina.  You have spotting or bleeding from your vagina.  You have severe abdominal cramping or pain.  You have rapid weight gain or weight loss.  You  have shortness of breath with chest pain.  You notice sudden or extreme swelling of your face, hands, ankles, feet, or legs.  You have not felt your baby move in over an hour.  You have severe headaches that do not go away when you take medicine.  You have vision changes. Summary  The second trimester is from week 14 through week 27 (months 4 through 6). It is also a time when the fetus is growing rapidly.  Your body goes through many changes during pregnancy. The changes vary from woman to woman.  Avoid all smoking, herbs, alcohol, and unprescribed drugs. These chemicals affect the formation and growth your baby.  Do not use any tobacco products, such as cigarettes, chewing tobacco, and e-cigarettes. If you need help quitting, ask your health care provider.  Contact your health care provider if you have any questions. Keep all prenatal visits as told by your health care provider. This is important. This information is not intended to replace advice given to you by your health care provider. Make sure you discuss any questions you have with your health care provider. Document Revised: 10/30/2018 Document Reviewed: 08/13/2016 [employer] Patient Education  2020 [employer]   Contraception Choices Contraception, also called birth control, refers to methods or devices that prevent pregnancy. Hormonal methods Contraceptive implant  A contraceptive implant is a thin, plastic tube that contains a hormone. It is inserted into the upper part of the arm. It can remain in place for up to 3 years. Progestin-only injections Progestin-only injections are injections of progestin, a synthetic form of the hormone progesterone. They are given every 3 months by a health care provider. Birth control pills  Birth control pills are pills that contain hormones that prevent pregnancy. They must be taken once a day, preferably at the same time each day. Birth control patch  The birth control patch  contains hormones that prevent pregnancy. It is placed on the skin and must be changed once a week for three weeks and removed on the fourth week. A prescription is needed to use this method of contraception. Vaginal ring  A vaginal ring contains hormones that prevent pregnancy. It is placed in the vagina for three weeks and removed on the fourth week. After that, the process is repeated with a new ring. A prescription is needed to use this method of contraception. Emergency contraceptive Emergency contraceptives prevent pregnancy after unprotected sex. They come in pill form and can be taken up to 5 days after sex. They work best the sooner they are taken after having sex. Most emergency contraceptives are available   without a prescription. This method should not be used as your only form of birth control. Barrier methods Female condom  A female condom is a thin sheath that is worn over the penis during sex. Condoms keep sperm from going inside a woman's body. They can be used with a spermicide to increase their effectiveness. They should be disposed after a single use. Female condom  A female condom is a soft, loose-fitting sheath that is put into the vagina before sex. The condom keeps sperm from going inside a woman's body. They should be disposed after a single use. Diaphragm  A diaphragm is a soft, dome-shaped barrier. It is inserted into the vagina before sex, along with a spermicide. The diaphragm blocks sperm from entering the uterus, and the spermicide kills sperm. A diaphragm should be left in the vagina for 6-8 hours after sex and removed within 24 hours. A diaphragm is prescribed and fitted by a health care provider. A diaphragm should be replaced every 1-2 years, after giving birth, after gaining more than 15 lb (6.8 kg), and after pelvic surgery. Cervical cap  A cervical cap is a round, soft latex or plastic cup that fits over the cervix. It is inserted into the vagina before sex, along  with spermicide. It blocks sperm from entering the uterus. The cap should be left in place for 6-8 hours after sex and removed within 48 hours. A cervical cap must be prescribed and fitted by a health care provider. It should be replaced every 2 years. Sponge  A sponge is a soft, circular piece of polyurethane foam with spermicide on it. The sponge helps block sperm from entering the uterus, and the spermicide kills sperm. To use it, you make it wet and then insert it into the vagina. It should be inserted before sex, left in for at least 6 hours after sex, and removed and thrown away within 30 hours. Spermicides Spermicides are chemicals that kill or block sperm from entering the cervix and uterus. They can come as a cream, jelly, suppository, foam, or tablet. A spermicide should be inserted into the vagina with an applicator at least 10-15 minutes before sex to allow time for it to work. The process must be repeated every time you have sex. Spermicides do not require a prescription. Intrauterine contraception Intrauterine device (IUD) An IUD is a T-shaped device that is put in a woman's uterus. There are two types:  Hormone IUD.This type contains progestin, a synthetic form of the hormone progesterone. This type can stay in place for 3-5 years.  Copper IUD.This type is wrapped in copper wire. It can stay in place for 10 years.  Permanent methods of contraception Female tubal ligation In this method, a woman's fallopian tubes are sealed, tied, or blocked during surgery to prevent eggs from traveling to the uterus. Hysteroscopic sterilization In this method, a small, flexible insert is placed into each fallopian tube. The inserts cause scar tissue to form in the fallopian tubes and block them, so sperm cannot reach an egg. The procedure takes about 3 months to be effective. Another form of birth control must be used during those 3 months. Female sterilization This is a procedure to tie off the  tubes that carry sperm (vasectomy). After the procedure, the man can still ejaculate fluid (semen). Natural planning methods Natural family planning In this method, a couple does not have sex on days when the woman could become pregnant. Calendar method This means keeping track of the length   of each menstrual cycle, identifying the days when pregnancy can happen, and not having sex on those days. Ovulation method In this method, a couple avoids sex during ovulation. Symptothermal method This method involves not having sex during ovulation. The woman typically checks for ovulation by watching changes in her temperature and in the consistency of cervical mucus. Post-ovulation method In this method, a couple waits to have sex until after ovulation. Summary  Contraception, also called birth control, means methods or devices that prevent pregnancy.  Hormonal methods of contraception include implants, injections, pills, patches, vaginal rings, and emergency contraceptives.  Barrier methods of contraception can include female condoms, female condoms, diaphragms, cervical caps, sponges, and spermicides.  There are two types of IUDs (intrauterine devices). An IUD can be put in a woman's uterus to prevent pregnancy for 3-5 years.  Permanent sterilization can be done through a procedure for males, females, or both.  Natural family planning methods involve not having sex on days when the woman could become pregnant. This information is not intended to replace advice given to you by your health care provider. Make sure you discuss any questions you have with your health care provider. Document Revised: 07/10/2017 Document Reviewed: 08/10/2016 [employer] Patient Education  2020 [employer]   Breastfeeding  Choosing to breastfeed is one of the best decisions you can make for yourself and your baby. A change in hormones during pregnancy causes your breasts to make breast milk in your milk-producing  glands. Hormones prevent breast milk from being released before your baby is born. They also prompt milk flow after birth. Once breastfeeding has begun, thoughts of your baby, as well as his or her sucking or crying, can stimulate the release of milk from your milk-producing glands. Benefits of breastfeeding Research shows that breastfeeding offers many health benefits for infants and mothers. It also offers a cost-free and convenient way to feed your baby. For your baby  Your first milk (colostrum) helps your baby's digestive system to function better.  Special cells in your milk (antibodies) help your baby to fight off infections.  Breastfed babies are less likely to develop asthma, allergies, obesity, or type 2 diabetes. They are also at lower risk for sudden infant death syndrome (SIDS).  Nutrients in breast milk are better able to meet your baby's needs compared to infant formula.  Breast milk improves your baby's brain development. For you  Breastfeeding helps to create a very special bond between you and your baby.  Breastfeeding is convenient. Breast milk costs nothing and is always available at the correct temperature.  Breastfeeding helps to burn calories. It helps you to lose the weight that you gained during pregnancy.  Breastfeeding makes your uterus return faster to its size before pregnancy. It also slows bleeding (lochia) after you give birth.  Breastfeeding helps to lower your risk of developing type 2 diabetes, osteoporosis, rheumatoid arthritis, cardiovascular disease, and breast, ovarian, uterine, and endometrial cancer later in life. Breastfeeding basics Starting breastfeeding  Find a comfortable place to sit or lie down, with your neck and back well-supported.  Place a pillow or a rolled-up blanket under your baby to bring him or her to the level of your breast (if you are seated). Nursing pillows are specially designed to help support your arms and your baby while  you breastfeed.  Make sure that your baby's tummy (abdomen) is facing your abdomen.  Gently massage your breast. With your fingertips, massage from the outer edges of your breast inward toward   the nipple. This encourages milk flow. If your milk flows slowly, you may need to continue this action during the feeding.  Support your breast with 4 fingers underneath and your thumb above your nipple (make the letter "C" with your hand). Make sure your fingers are well away from your nipple and your baby's mouth.  Stroke your baby's lips gently with your finger or nipple.  When your baby's mouth is open wide enough, quickly bring your baby to your breast, placing your entire nipple and as much of the areola as possible into your baby's mouth. The areola is the colored area around your nipple. ? More areola should be visible above your baby's upper lip than below the lower lip. ? Your baby's lips should be opened and extended outward (flanged) to ensure an adequate, comfortable latch. ? Your baby's tongue should be between his or her lower gum and your breast.  Make sure that your baby's mouth is correctly positioned around your nipple (latched). Your baby's lips should create a seal on your breast and be turned out (everted).  It is common for your baby to suck about 2-3 minutes in order to start the flow of breast milk. Latching Teaching your baby how to latch onto your breast properly is very important. An improper latch can cause nipple pain, decreased milk supply, and poor weight gain in your baby. Also, if your baby is not latched onto your nipple properly, he or she may swallow some air during feeding. This can make your baby fussy. Burping your baby when you switch breasts during the feeding can help to get rid of the air. However, teaching your baby to latch on properly is still the best way to prevent fussiness from swallowing air while breastfeeding. Signs that your baby has successfully  latched onto your nipple  Silent tugging or silent sucking, without causing you pain. Infant's lips should be extended outward (flanged).  Swallowing heard between every 3-4 sucks once your milk has started to flow (after your let-down milk reflex occurs).  Muscle movement above and in front of his or her ears while sucking. Signs that your baby has not successfully latched onto your nipple  Sucking sounds or smacking sounds from your baby while breastfeeding.  Nipple pain. If you think your baby has not latched on correctly, slip your finger into the corner of your baby's mouth to break the suction and place it between your baby's gums. Attempt to start breastfeeding again. Signs of successful breastfeeding Signs from your baby  Your baby will gradually decrease the number of sucks or will completely stop sucking.  Your baby will fall asleep.  Your baby's body will relax.  Your baby will retain a small amount of milk in his or her mouth.  Your baby will let go of your breast by himself or herself. Signs from you  Breasts that have increased in firmness, weight, and size 1-3 hours after feeding.  Breasts that are softer immediately after breastfeeding.  Increased milk volume, as well as a change in milk consistency and color by the fifth day of breastfeeding.  Nipples that are not sore, cracked, or bleeding. Signs that your baby is getting enough milk  Wetting at least 1-2 diapers during the first 24 hours after birth.  Wetting at least 5-6 diapers every 24 hours for the first week after birth. The urine should be clear or pale yellow by the age of 5 days.  Wetting 6-8 diapers every 24 hours as   your baby continues to grow and develop.  At least 3 stools in a 24-hour period by the age of 5 days. The stool should be soft and yellow.  At least 3 stools in a 24-hour period by the age of 7 days. The stool should be seedy and yellow.  No loss of weight greater than 10% of  birth weight during the first 3 days of life.  Average weight gain of 4-7 oz (113-198 g) per week after the age of 4 days.  Consistent daily weight gain by the age of 5 days, without weight loss after the age of 2 weeks. After a feeding, your baby may spit up a small amount of milk. This is normal. Breastfeeding frequency and duration Frequent feeding will help you make more milk and can prevent sore nipples and extremely full breasts (breast engorgement). Breastfeed when you feel the need to reduce the fullness of your breasts or when your baby shows signs of hunger. This is called "breastfeeding on demand." Signs that your baby is hungry include:  Increased alertness, activity, or restlessness.  Movement of the head from side to side.  Opening of the mouth when the corner of the mouth or cheek is stroked (rooting).  Increased sucking sounds, smacking lips, cooing, sighing, or squeaking.  Hand-to-mouth movements and sucking on fingers or hands.  Fussing or crying. Avoid introducing a pacifier to your baby in the first 4-6 weeks after your baby is born. After this time, you may choose to use a pacifier. Research has shown that pacifier use during the first year of a baby's life decreases the risk of sudden infant death syndrome (SIDS). Allow your baby to feed on each breast as long as he or she wants. When your baby unlatches or falls asleep while feeding from the first breast, offer the second breast. Because newborns are often sleepy in the first few weeks of life, you may need to awaken your baby to get him or her to feed. Breastfeeding times will vary from baby to baby. However, the following rules can serve as a guide to help you make sure that your baby is properly fed:  Newborns (babies 4 weeks of age or younger) may breastfeed every 1-3 hours.  Newborns should not go without breastfeeding for longer than 3 hours during the day or 5 hours during the night.  You should breastfeed  your baby a minimum of 8 times in a 24-hour period. Breast milk pumping     Pumping and storing breast milk allows you to make sure that your baby is exclusively fed your breast milk, even at times when you are unable to breastfeed. This is especially important if you go back to work while you are still breastfeeding, or if you are not able to be present during feedings. Your lactation consultant can help you find a method of pumping that works best for you and give you guidelines about how long it is safe to store breast milk. Caring for your breasts while you breastfeed Nipples can become dry, cracked, and sore while breastfeeding. The following recommendations can help keep your breasts moisturized and healthy:  Avoid using soap on your nipples.  Wear a supportive bra designed especially for nursing. Avoid wearing underwire-style bras or extremely tight bras (sports bras).  Air-dry your nipples for 3-4 minutes after each feeding.  Use only cotton bra pads to absorb leaked breast milk. Leaking of breast milk between feedings is normal.  Use lanolin on your nipples   after breastfeeding. Lanolin helps to maintain your skin's normal moisture barrier. Pure lanolin is not harmful (not toxic) to your baby. You may also hand express a few drops of breast milk and gently massage that milk into your nipples and allow the milk to air-dry. In the first few weeks after giving birth, some women experience breast engorgement. Engorgement can make your breasts feel heavy, warm, and tender to the touch. Engorgement peaks within 3-5 days after you give birth. The following recommendations can help to ease engorgement:  Completely empty your breasts while breastfeeding or pumping. You may want to start by applying warm, moist heat (in the shower or with warm, water-soaked hand towels) just before feeding or pumping. This increases circulation and helps the milk flow. If your baby does not completely empty your  breasts while breastfeeding, pump any extra milk after he or she is finished.  Apply ice packs to your breasts immediately after breastfeeding or pumping, unless this is too uncomfortable for you. To do this: ? Put ice in a plastic bag. ? Place a towel between your skin and the bag. ? Leave the ice on for 20 minutes, 2-3 times a day.  Make sure that your baby is latched on and positioned properly while breastfeeding. If engorgement persists after 48 hours of following these recommendations, contact your health care provider or a lactation consultant. Overall health care recommendations while breastfeeding  Eat 3 healthy meals and 3 snacks every day. Well-nourished mothers who are breastfeeding need an additional 450-500 calories a day. You can meet this requirement by increasing the amount of a balanced diet that you eat.  Drink enough water to keep your urine pale yellow or clear.  Rest often, relax, and continue to take your prenatal vitamins to prevent fatigue, stress, and low vitamin and mineral levels in your body (nutrient deficiencies).  Do not use any products that contain nicotine or tobacco, such as cigarettes and e-cigarettes. Your baby may be harmed by chemicals from cigarettes that pass into breast milk and exposure to secondhand smoke. If you need help quitting, ask your health care provider.  Avoid alcohol.  Do not use illegal drugs or marijuana.  Talk with your health care provider before taking any medicines. These include over-the-counter and prescription medicines as well as vitamins and herbal supplements. Some medicines that may be harmful to your baby can pass through breast milk.  It is possible to become pregnant while breastfeeding. If birth control is desired, ask your health care provider about options that will be safe while breastfeeding your baby. Where to find more information: [employer]: www.llli.org Contact a health care provider  if:  You feel like you want to stop breastfeeding or have become frustrated with breastfeeding.  Your nipples are cracked or bleeding.  Your breasts are red, tender, or warm.  You have: ? Painful breasts or nipples. ? A swollen area on either breast. ? A fever or chills. ? Nausea or vomiting. ? Drainage other than breast milk from your nipples.  Your breasts do not become full before feedings by the fifth day after you give birth.  You feel sad and depressed.  Your baby is: ? Too sleepy to eat well. ? Having trouble sleeping. ? More than 1 week old and wetting fewer than 6 diapers in a 24-hour period. ? Not gaining weight by 5 days of age.  Your baby has fewer than 3 stools in a 24-hour period.  Your baby's skin or   the white parts of his or her eyes become yellow. Get help right away if:  Your baby is overly tired (lethargic) and does not want to wake up and feed.  Your baby develops an unexplained fever. Summary  Breastfeeding offers many health benefits for infant and mothers.  Try to breastfeed your infant when he or she shows early signs of hunger.  Gently tickle or stroke your baby's lips with your finger or nipple to allow the baby to open his or her mouth. Bring the baby to your breast. Make sure that much of the areola is in your baby's mouth. Offer one side and burp the baby before you offer the other side.  Talk with your health care provider or lactation consultant if you have questions or you face problems as you breastfeed. This information is not intended to replace advice given to you by your health care provider. Make sure you discuss any questions you have with your health care provider. Document Revised: 10/02/2017 Document Reviewed: 08/09/2016 Elsevier Patient Education  2020 Elsevier Inc.  

## 2020-07-07 NOTE — Progress Notes (Signed)
Babyscripts values:    Farley Ly 07/07/20

## 2020-07-10 ENCOUNTER — Other Ambulatory Visit: Payer: Self-pay

## 2020-07-10 ENCOUNTER — Encounter (HOSPITAL_COMMUNITY)
Admission: RE | Admit: 2020-07-10 | Discharge: 2020-07-10 | Disposition: A | Discharge status: Home / Self Care | Payer: Medicaid Other | Source: Ambulatory Visit | Attending: Student | Admitting: Student

## 2020-07-10 DIAGNOSIS — O210 Mild hyperemesis gravidarum: Secondary | ICD-10-CM | POA: Diagnosis not present

## 2020-07-10 MED ORDER — SODIUM CHLORIDE 0.9 % IV SOLN
12.5000 mg | INTRAVENOUS | Status: DC
  Administered 2020-07-10: 08:00:00 12.5 mg via INTRAVENOUS
  Filled 2020-07-10: qty 0.5

## 2020-07-11 ENCOUNTER — Encounter (HOSPITAL_COMMUNITY)
Admission: RE | Admit: 2020-07-11 | Discharge: 2020-07-11 | Disposition: A | Discharge status: Home / Self Care | Payer: Medicaid Other | Source: Ambulatory Visit | Attending: Student | Admitting: Student

## 2020-07-11 ENCOUNTER — Other Ambulatory Visit: Payer: Self-pay

## 2020-07-11 DIAGNOSIS — O210 Mild hyperemesis gravidarum: Secondary | ICD-10-CM | POA: Diagnosis not present

## 2020-07-11 MED ORDER — SODIUM CHLORIDE 0.9 % IV SOLN
12.5000 mg | INTRAVENOUS | Status: DC
  Administered 2020-07-11: 08:00:00 12.5 mg via INTRAVENOUS
  Filled 2020-07-11: qty 0.5

## 2020-07-12 ENCOUNTER — Other Ambulatory Visit: Payer: Self-pay

## 2020-07-12 ENCOUNTER — Encounter (HOSPITAL_COMMUNITY)
Admission: RE | Admit: 2020-07-12 | Discharge: 2020-07-12 | Disposition: A | Discharge status: Home / Self Care | Payer: Medicaid Other | Source: Ambulatory Visit | Attending: Student | Admitting: Student

## 2020-07-12 DIAGNOSIS — O210 Mild hyperemesis gravidarum: Secondary | ICD-10-CM | POA: Diagnosis not present

## 2020-07-12 MED ORDER — SODIUM CHLORIDE 0.9 % IV SOLN
12.5000 mg | INTRAVENOUS | Status: DC
  Administered 2020-07-12: 08:00:00 12.5 mg via INTRAVENOUS
  Filled 2020-07-12: qty 0.5

## 2020-07-13 ENCOUNTER — Ambulatory Visit (HOSPITAL_COMMUNITY): Payer: Medicaid Other

## 2020-07-13 ENCOUNTER — Other Ambulatory Visit: Payer: Self-pay

## 2020-07-13 ENCOUNTER — Encounter (HOSPITAL_COMMUNITY)
Admission: RE | Admit: 2020-07-13 | Discharge: 2020-07-13 | Disposition: A | Discharge status: Home / Self Care | Payer: Medicaid Other | Source: Ambulatory Visit | Attending: Student | Admitting: Student

## 2020-07-13 DIAGNOSIS — O210 Mild hyperemesis gravidarum: Secondary | ICD-10-CM | POA: Diagnosis not present

## 2020-07-13 MED ORDER — SODIUM CHLORIDE 0.9 % IV SOLN
12.5000 mg | INTRAVENOUS | Status: DC
  Administered 2020-07-13: 08:00:00 12.5 mg via INTRAVENOUS
  Filled 2020-07-13: qty 0.5

## 2020-07-14 ENCOUNTER — Other Ambulatory Visit: Payer: Self-pay | Admitting: Student

## 2020-07-14 DIAGNOSIS — Z3A23 23 weeks gestation of pregnancy: Secondary | ICD-10-CM

## 2020-07-14 DIAGNOSIS — O210 Mild hyperemesis gravidarum: Secondary | ICD-10-CM

## 2020-07-14 MED ORDER — SODIUM CHLORIDE 0.9 % IV SOLN: INTRAVENOUS | 16 refills | Status: DC

## 2020-07-17 ENCOUNTER — Encounter (HOSPITAL_COMMUNITY)
Admission: RE | Admit: 2020-07-17 | Discharge: 2020-07-17 | Disposition: A | Discharge status: Home / Self Care | Payer: Medicaid Other | Source: Ambulatory Visit | Attending: Student | Admitting: Student

## 2020-07-17 ENCOUNTER — Other Ambulatory Visit: Payer: Self-pay

## 2020-07-17 DIAGNOSIS — O210 Mild hyperemesis gravidarum: Secondary | ICD-10-CM | POA: Diagnosis not present

## 2020-07-17 MED ORDER — SODIUM CHLORIDE 0.9 % IV SOLN
12.5000 mg | INTRAVENOUS | Status: DC
  Administered 2020-07-17: 08:00:00 12.5 mg via INTRAVENOUS
  Filled 2020-07-17: qty 0.5

## 2020-07-18 ENCOUNTER — Encounter (HOSPITAL_COMMUNITY)
Admission: RE | Admit: 2020-07-18 | Discharge: 2020-07-18 | Disposition: A | Discharge status: Home / Self Care | Payer: Medicaid Other | Source: Ambulatory Visit | Attending: Student | Admitting: Student

## 2020-07-18 ENCOUNTER — Other Ambulatory Visit: Payer: Self-pay

## 2020-07-18 DIAGNOSIS — O210 Mild hyperemesis gravidarum: Secondary | ICD-10-CM | POA: Diagnosis not present

## 2020-07-18 MED ORDER — SODIUM CHLORIDE 0.9 % IV SOLN
12.5000 mg | INTRAVENOUS | Status: DC
  Administered 2020-07-18: 09:00:00 12.5 mg via INTRAVENOUS
  Filled 2020-07-18: qty 0.5

## 2020-07-19 ENCOUNTER — Encounter (HOSPITAL_COMMUNITY)
Admission: RE | Admit: 2020-07-19 | Discharge: 2020-07-19 | Disposition: A | Discharge status: Home / Self Care | Payer: Medicaid Other | Source: Ambulatory Visit | Attending: Student | Admitting: Student

## 2020-07-19 DIAGNOSIS — O210 Mild hyperemesis gravidarum: Secondary | ICD-10-CM | POA: Diagnosis not present

## 2020-07-19 MED ORDER — SODIUM CHLORIDE 0.9 % IV SOLN
12.5000 mg | INTRAVENOUS | Status: DC
  Administered 2020-07-19: 10:00:00 12.5 mg via INTRAVENOUS
  Filled 2020-07-19: qty 0.5

## 2020-07-20 ENCOUNTER — Ambulatory Visit (HOSPITAL_COMMUNITY): Payer: Medicaid Other

## 2020-07-20 DIAGNOSIS — F319 Bipolar disorder, unspecified: Secondary | ICD-10-CM | POA: Diagnosis not present

## 2020-07-22 ENCOUNTER — Other Ambulatory Visit: Payer: Self-pay | Admitting: Student

## 2020-07-22 DIAGNOSIS — Z3A24 24 weeks gestation of pregnancy: Secondary | ICD-10-CM

## 2020-07-22 NOTE — L&D Delivery Note (Addendum)
Delivery Note Variable decels started at 0815. Prolonged decel at 0939 x 8 minutes down to 60-80's. Improved w/ position change. Pt found to be completely dilated at 0841 and pushed very effectively x 9 minutes. At 8:50 AM a viable and healthy female was delivered via Vaginal, Spontaneous (Presentation: Right Occiput Anterior). Compound right arm contributed to ~ 30 second delay of delivery of body. Delivery aided by hooking of left axilla. Shoulders delivered easily.   APGAR: 8, 9; weight  pending. Baby placed skin-to-skin w/ mom. After delayed cord clamping, cord was clamped x 2 and cut by FOB.  Placenta status: Spontaneous, Intact.  Cord:   with the following complications:  .  Cord pH: NA  Anesthesia: Epidural Episiotomy: None Lacerations: None  Suture Repair: NA Est. Blood Loss (mL):  350 ml  Mom to postpartum.  Baby to Couplet care / Skin to Skin. Placenta to: L&D Feeding: Breast Circ: NA Contraception: IP Nexplanon  SW consult due to Hx depression and Bipolar disorder.  Dorathy Kinsman 10/22/2020, 9:07 AM

## 2020-07-24 ENCOUNTER — Other Ambulatory Visit: Payer: Self-pay

## 2020-07-24 ENCOUNTER — Encounter (HOSPITAL_COMMUNITY)
Admission: RE | Admit: 2020-07-24 | Discharge: 2020-07-24 | Disposition: A | Discharge status: Home / Self Care | Payer: Medicaid Other | Source: Ambulatory Visit | Attending: Student | Admitting: Student

## 2020-07-24 DIAGNOSIS — O210 Mild hyperemesis gravidarum: Secondary | ICD-10-CM | POA: Diagnosis not present

## 2020-07-24 DIAGNOSIS — O0990 Supervision of high risk pregnancy, unspecified, unspecified trimester: Secondary | ICD-10-CM | POA: Insufficient documentation

## 2020-07-24 MED ORDER — SODIUM CHLORIDE 0.9 % IV SOLN
12.5000 mg | INTRAVENOUS | Status: DC
  Administered 2020-07-24: 12.5 mg via INTRAVENOUS
  Filled 2020-07-24: qty 0.5

## 2020-07-25 ENCOUNTER — Ambulatory Visit (HOSPITAL_COMMUNITY): Payer: Medicaid Other

## 2020-07-26 ENCOUNTER — Other Ambulatory Visit: Payer: Self-pay

## 2020-07-26 ENCOUNTER — Encounter (HOSPITAL_COMMUNITY)
Admission: RE | Admit: 2020-07-26 | Discharge: 2020-07-26 | Disposition: A | Discharge status: Home / Self Care | Payer: Medicaid Other | Source: Ambulatory Visit | Attending: Student | Admitting: Student

## 2020-07-26 DIAGNOSIS — O0990 Supervision of high risk pregnancy, unspecified, unspecified trimester: Secondary | ICD-10-CM | POA: Diagnosis not present

## 2020-07-26 DIAGNOSIS — O210 Mild hyperemesis gravidarum: Secondary | ICD-10-CM | POA: Diagnosis not present

## 2020-07-26 MED ORDER — SODIUM CHLORIDE 0.9 % IV SOLN
12.5000 mg | INTRAVENOUS | Status: DC
  Administered 2020-07-26: 12.5 mg via INTRAVENOUS
  Filled 2020-07-26: qty 0.5

## 2020-07-28 ENCOUNTER — Other Ambulatory Visit: Payer: Self-pay

## 2020-07-28 ENCOUNTER — Encounter (HOSPITAL_COMMUNITY)
Admission: RE | Admit: 2020-07-28 | Discharge: 2020-07-28 | Disposition: A | Discharge status: Home / Self Care | Payer: Medicaid Other | Source: Ambulatory Visit | Attending: Student | Admitting: Student

## 2020-07-28 DIAGNOSIS — O0990 Supervision of high risk pregnancy, unspecified, unspecified trimester: Secondary | ICD-10-CM | POA: Diagnosis not present

## 2020-07-28 DIAGNOSIS — O210 Mild hyperemesis gravidarum: Secondary | ICD-10-CM

## 2020-07-28 MED ORDER — PROMETHAZINE HCL 25 MG PO TABS: 25.0000 mg | ORAL_TABLET | Freq: Four times a day (QID) | ORAL | 0 refills | Status: DC | PRN

## 2020-07-28 MED ORDER — SODIUM CHLORIDE 0.9 % IV SOLN
12.5000 mg | INTRAVENOUS | Status: DC
  Administered 2020-07-28: 12.5 mg via INTRAVENOUS
  Filled 2020-07-28: qty 0.5

## 2020-07-31 ENCOUNTER — Ambulatory Visit (INDEPENDENT_AMBULATORY_CARE_PROVIDER_SITE_OTHER): Payer: Medicaid Other | Admitting: Licensed Clinical Social Worker

## 2020-07-31 ENCOUNTER — Other Ambulatory Visit: Payer: Self-pay

## 2020-07-31 DIAGNOSIS — F319 Bipolar disorder, unspecified: Secondary | ICD-10-CM | POA: Diagnosis not present
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
## 2020-08-01 ENCOUNTER — Ambulatory Visit (INDEPENDENT_AMBULATORY_CARE_PROVIDER_SITE_OTHER): Payer: Medicaid Other | Admitting: Psychiatry

## 2020-08-01 ENCOUNTER — Telehealth (HOSPITAL_COMMUNITY): Payer: Self-pay | Admitting: *Deleted

## 2020-08-01 ENCOUNTER — Encounter (HOSPITAL_COMMUNITY): Payer: Self-pay | Admitting: Psychiatry

## 2020-08-01 DIAGNOSIS — F319 Bipolar disorder, unspecified: Secondary | ICD-10-CM

## 2020-08-01 MED ORDER — OLANZAPINE 2.5 MG PO TABS: 2.5000 mg | ORAL_TABLET | Freq: Every day | ORAL | 2 refills | Status: DC

## 2020-08-01 NOTE — Telephone Encounter (Signed)
Patient called after leaving her appt to ask to speak with you concerning the side effects you had spoke to her of concerning the baby. Please call back @ your convince.

## 2020-08-01 NOTE — Telephone Encounter (Signed)
Called at [phone] stating she wants to speak to Ms Leah [name] because she didn't discuss all the risks with her on one of her medicines though she didn't say which one. I told her it is late today and we leave soon but I would give the message to Dr Leah [name] now but it may be tomorrow before she can respond.

## 2020-08-01 NOTE — Progress Notes (Signed)
Psychiatric Initial Adult Assessment   Patient Identification: Leah [name] MRN:  [id number] Date of Evaluation:  08/01/2020 Referral Source: Walk in  Chief Complaint:  "Things are getting worse" Visit Diagnosis:    ICD-10-CM   1. Bipolar 1 disorder (HCC)  F31.9 OLANZapine (ZYPREXA) 2.5 MG tablet    History of Present Illness: 25 year old female seen today for initial psychiatric evaluation.  She has a psychiatric history of depression, bipolar disorder, and SI/SA.  She is currently not prescribed medications however she notes that she was taking Zyprexa 5 mg.  She states she discontinued the medication after she found out that she was pregnant.  Today patient is tearful, cooperative, engaged in conversation, and had eye poor contact.  She describes her mood as depressed and irritable.  She notes that since stopping her medications things have gotten worse.  She endorses distractibility, irritability, VAH, and impulsive spending.  Provider conducted a PHQ-9 and patient scored 11.  Provider also conducted an GAD-7 and patient scored a 7.  Today she denies SI/HI however does state she has been experiencing thoughts that are getting worse.  She notes that she may try to be hospitalized on Friday.  Provider asked patient if she had any means to harm herself and she noted that she did not.  She however note that she just feels overwhelmed.  Provider asked patient if she has support.  She notes that her mother and sister are supportive.  Provider called patient's mother who informed writer that she felt like her daughter was not a danger to herself or others at this time.  She noted that she would keep an eye on her daughter.  Provider instructed patient and her mother to come into [facility]  if symptoms worsen.  They endorsed understanding and agreed.  Patient reports that she would like to be restarted on medication however would like to know the effect that it would have on her unborn child. Provider  informed patient that antipsychotics are not recommended in the first trimester.  She informed provider that she was [redacted] weeks pregnant.  Provider informed patient that symptoms that could occur to her child postpartum could be tremors, agitation, somnolence, respiratory distress, and feeding difficulties.  She endorsed understanding however noted that she still would like to start medication.  She is agreeable to starting Zyprexa 2.5 mg daily to help manage mood.  She will follow-up with provider and 2 months for further evaluation.  She will also follow-up with outpatient counseling for further evaluation.  No other concerns noted at this time. Associated Signs/Symptoms: Depression Symptoms:  depressed mood, anhedonia, psychomotor agitation, fatigue, feelings of worthlessness/guilt, difficulty concentrating, hopelessness, suicidal thoughts without plan, anxiety, loss of energy/fatigue, decreased appetite, (Hypo) Manic Symptoms:  Distractibility, Elevated Mood, Flight of Ideas, Community education officer, Hallucinations, Impulsivity, Irritable Mood, Anxiety Symptoms:  Excessive Worry, Psychotic Symptoms:  Hallucinations: Auditory Visual PTSD Symptoms: NA  Past Psychiatric History: Depression, Bipolar disorder, SI/SA Previous Psychotropic Medications: Zyprexa (most effective), lamictal, rermeron, and risperdal  Substance Abuse History in the last 12 months:  No.  Consequences of Substance Abuse: NA  Past Medical History:  Past Medical History:  Diagnosis Date  . Anemia 2013  . Bipolar 1 disorder ([place])   . Gall stones   . GERD (gastroesophageal reflux disease)   . 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 CHOLECYSTECTOMY N/A 09/28/2014   Procedure: LAPAROSCOPIC CHOLECYSTECTOMY;  Surgeon: Ralene Ok, MD;  Location[facility];  Service: General;  Laterality: N/A;  . CHOLECYSTECTOMY    . DIRECT LARYNGOSCOPY N/A 04/24/2014    Procedure: DIRECT LARYNGOSCOPY;  Surgeon: Ascencion Dike, MD;  Location: [place];  Service: ENT;  Laterality: N/A;  . FOREIGN BODY REMOVAL ESOPHAGEAL N/A 04/24/2014   Procedure: REMOVAL FOREIGN BODY ESOPHAGEAL;  Surgeon: Ascencion Dike, MD;  Location: [facility] OR;  Service: ENT;  Laterality: N/A;  . WISDOM TOOTH EXTRACTION  2013    Family Psychiatric History: Denies Family History:  Family History  Problem Relation Age of Onset  . Arthritis Mother   . Bronchitis Mother   . Asthma Mother   . Hearing loss Paternal Grandfather   . Diabetes Neg Hx   . Stomach cancer Neg Hx   . Colon cancer Neg Hx     Social History:   Social History   Socioeconomic History  . Marital status: Single    Spouse name: Not on file  . Number of children: 1  . Years of education: 38 th   . Highest education level: Not on file  Occupational History  . Occupation: Scientist, water quality -[employer]  Tobacco Use  . Smoking status: Never Smoker  . Smokeless tobacco: Never Used  . Tobacco comment: marjiuna  Vaping Use  . Vaping Use: Never used  Substance and Sexual Activity  . Alcohol use: Not Currently    Alcohol/week: 6.0 standard drinks    Types: 6 Cans of beer per week  . Drug use: Yes    Types: Marijuana    Comment: last used before pregnancy  . Sexual activity: Yes    Partners: Male    Birth control/protection: Patch  Other Topics Concern  . Not on file  Social History Narrative   ** Merged History Encounter **       Live with roommate and son.    Starting at [employer].    Social Determinants of Health   Financial Resource Strain: Low Risk   . Difficulty of Paying Living Expenses: Not hard at all  Food Insecurity: No Food Insecurity  . Worried About Charity fundraiser in the Last Year: Never true  . Ran Out of Food in the Last Year: Never true  Transportation Needs: No Transportation Needs  . Lack of Transportation (Medical): No  . Lack of Transportation (Non-Medical): No  Physical Activity: Inactive  . Days  of Exercise per Week: 0 days  . Minutes of Exercise per Session: 0 min  Stress: No Stress Concern Present  . Feeling of Stress : Only a little  Social Connections: Socially Isolated  . Frequency of Communication with Friends and Family: Once a week  . Frequency of Social Gatherings with Friends and Family: Never  . Attends Religious Services: Never  . Active Member of Clubs or Organizations: No  . Attends Archivist Meetings: Never  . Marital Status: Never married    Additional Social History: Patient resides in [place] with her 46-year-old son.  She is single.  She denies tobacco, alcohol, or illegal drug use.  She admits she works at [employer].   Allergies:   Allergies  Allergen Reactions  . Bactrim [Sulfamethoxazole-Trimethoprim] Rash    Metabolic Disorder Labs: Lab Results  Component Value Date   HGBA1C 5.6 04/11/2020   MPG 105.41 12/31/2018   MPG 103 08/22/2016   Lab Results  Component Value Date   PROLACTIN 18.9 10/01/2017   PROLACTIN 6.4 11/18/2014   Lab Results  Component Value  Date   CHOL 159 12/31/2018   TRIG 31 12/31/2018   HDL 57 12/31/2018   CHOLHDL 2.8 12/31/2018   VLDL 6 12/31/2018   LDLCALC 96 12/31/2018   LDLCALC 82 08/22/2016   Lab Results  Component Value Date   TSH 0.940 12/31/2018    Therapeutic Level Labs: No results found for: LITHIUM No results found for: CBMZ No results found for: VALPROATE  Current Medications: Current Outpatient Medications  Medication Sig Dispense Refill  . OLANZapine (ZYPREXA) 2.5 MG tablet Take 1 tablet (2.5 mg total) by mouth at bedtime. 30 tablet 2  . Blood Pressure Monitoring (BLOOD PRESSURE KIT) DEVI 1 Device by Does not apply route as needed. 1 each 0  . metoCLOPramide (REGLAN) 10 MG tablet Take 1 tablet (10 mg total) by mouth 4 (four) times daily -  before meals and at bedtime. (Patient not taking: Reported on 07/07/2020) 90 tablet 2  . ondansetron (ZOFRAN ODT) 8 MG disintegrating tablet Take  1 tablet (8 mg total) by mouth every 8 (eight) hours as needed for nausea or vomiting. 40 tablet 0  . pantoprazole (PROTONIX) 20 MG tablet Take 1 tablet (20 mg total) by mouth daily. (Patient not taking: Reported on 07/07/2020) 30 tablet 2  . polyethylene glycol (MIRALAX) 17 g packet Take 17 g by mouth daily. 14 each 0  . Prenatal Vit-Fe Fumarate-FA (PRENATAL MULTIVITAMIN) TABS tablet Take 1 tablet by mouth daily at 12 noon. (Patient not taking: No sig reported)    . promethazine (PHENERGAN) 25 MG tablet Take 1 tablet (25 mg total) by mouth every 6 (six) hours as needed for nausea or vomiting. 30 tablet 0  . promethazine in sodium chloride 0.9 % 1,000 mL Please infuse 1 L NS with 25 mg of phenergan added and infused in over two hours IV. Please repeat 4 times weekly for the next  four weeks. 1 Bag 16  . scopolamine (TRANSDERM-SCOP) 1 MG/3DAYS Place 1 patch (1.5 mg total) onto the skin every 3 (three) days. (Patient not taking: Reported on 07/07/2020) 10 patch 2   No current facility-administered medications for this visit.    Musculoskeletal: Strength & Muscle Tone: within normal limits Gait & Station: normal Patient leans: N/A  Psychiatric Specialty Exam: Review of Systems  Last menstrual period 02/01/2020.There is no height or weight on file to calculate BMI.  General Appearance: Well Groomed  Eye Contact:  Good  Speech:  Clear and Coherent and Normal Rate  Volume:  Normal  Mood:  Anxious, Depressed and Irritable  Affect:  Appropriate and Congruent  Thought Process:  Coherent, Goal Directed and Linear  Orientation:  Full (Time, Place, and Person)  Thought Content:  Logical and Hallucinations: Auditory Visual  Suicidal Thoughts:  Yes.  with intent/plan  Homicidal Thoughts:  No  Memory:  Immediate;   Good Recent;   Good Remote;   Good  Judgement:  Fair  Insight:  Fair  Psychomotor Activity:  Normal  Concentration:  Concentration: Good and Attention Span: Good  Recall:  Good   Fund of Knowledge:Good  Language: Good  Akathisia:  No  Handed:  Right  AIMS (if indicated):  Not done  Assets:  Communication Skills Desire for Improvement Financial Resources/Insurance Housing Social Support  ADL's:  Intact  Cognition: WNL  Sleep:  Poor   Screenings: AIMS   Flowsheet Row Admission (Discharged) from OP Visit from 12/31/2018 in [place] [facility] Admission (Discharged) from 12/07/2015 in [place] [facility]  AIMS Total Score 0 0    AUDIT   Flowsheet Row Admission (Discharged) from OP Visit from 12/31/2018 in [place] [facility] Admission (Discharged) from 08/19/2016 in [place] Admission (Discharged) from 12/07/2015 in [place] [facility]  Alcohol Use Disorder Identification Test Final Score (AUDIT) 1 1 8     GAD-7   Flowsheet Row Clinical Support from 08/01/2020 in [facility] Routine Prenatal from 06/28/2020 in Center for [place] at [facility] Routine Prenatal from 06/09/2020 in Center for [place] at [facility] for Women Routine Prenatal from 05/25/2020 in Center for [place] at [facility] Initial Prenatal from 04/11/2020 in Center for [place] at [facility] for Women  Total GAD-7 Score 7 3 4 3 3     PHQ2-9   Flowsheet Row Clinical Support from 08/01/2020 in [facility] Routine Prenatal from 06/28/2020 in Center for [place] at [facility] for Women Routine Prenatal from 06/09/2020 in [place] [facility] [place] at [facility] for Women Routine Prenatal from 05/25/2020 in Center for [place] at [facility] for Women Initial Prenatal from 04/11/2020 in Center for [place] at [facility] for Women  PHQ-2 Total Score 5 0 0 0  0  PHQ-9 Total Score 11 0 0 3 3      Assessment and Plan: Patient endorses  symptoms of anxiety, depression, and hypomania.  Provider discussed risk and benefits of restarting an antipsychotic while pregnant.  Patient agreeable to start Zyprexa 2.5 mg nightly to help manage mood.  1. Bipolar 1 disorder (HCC)  Restart- OLANZapine (ZYPREXA) 2.5 MG tablet; Take 1 tablet (2.5 mg total) by mouth at bedtime.  Dispense: 30 tablet; Refill: 2  Follow-up in 2 months Follow-up therapy   Salley Slaughter, NP 1/11/20229:19 AM

## 2020-08-02 NOTE — Telephone Encounter (Signed)
Provider called patient and discussed side effects of Zyprexa. Provider informed patient that that potential symptoms that could occur to her child postpartum could be tremors, agitation, somnolence, respiratory distress, and feeding difficulties.  She notes that she is unsure if she will take Zyprexa. Provider also discussed trying Lamictal instead of Zyprexa. Provider informed patient of side effects of Lamictal. Patient notes that she is still unsure if she wasn't to start psychiatric medications. Provider endorsed understanding and encouraged patient to follow up with her OBGYN. Provider also informed patient that she could be seen sooner or she could call provider back after thinking on her discission. She endorsed understanding and agreed.

## 2020-08-03 DIAGNOSIS — F319 Bipolar disorder, unspecified: Secondary | ICD-10-CM | POA: Diagnosis not present

## 2020-08-04 ENCOUNTER — Other Ambulatory Visit: Payer: Self-pay

## 2020-08-04 ENCOUNTER — Other Ambulatory Visit (HOSPITAL_COMMUNITY)
Admission: RE | Admit: 2020-08-04 | Discharge: 2020-08-04 | Disposition: A | Discharge status: Home / Self Care | Payer: Medicaid Other | Source: Ambulatory Visit | Attending: Certified Nurse Midwife | Admitting: Certified Nurse Midwife

## 2020-08-04 ENCOUNTER — Encounter: Payer: Medicaid Other | Admitting: Certified Nurse Midwife

## 2020-08-04 ENCOUNTER — Ambulatory Visit (INDEPENDENT_AMBULATORY_CARE_PROVIDER_SITE_OTHER): Payer: Medicaid Other | Admitting: Certified Nurse Midwife

## 2020-08-04 VITALS — BP 114/75 | HR 93 | Wt 136.6 lb

## 2020-08-04 DIAGNOSIS — R252 Cramp and spasm: Secondary | ICD-10-CM

## 2020-08-04 DIAGNOSIS — O99891 Other specified diseases and conditions complicating pregnancy: Secondary | ICD-10-CM

## 2020-08-04 DIAGNOSIS — O26892 Other specified pregnancy related conditions, second trimester: Secondary | ICD-10-CM

## 2020-08-04 DIAGNOSIS — O0990 Supervision of high risk pregnancy, unspecified, unspecified trimester: Secondary | ICD-10-CM

## 2020-08-04 DIAGNOSIS — R102 Pelvic and perineal pain: Secondary | ICD-10-CM

## 2020-08-04 DIAGNOSIS — N898 Other specified noninflammatory disorders of vagina: Secondary | ICD-10-CM | POA: Insufficient documentation

## 2020-08-04 DIAGNOSIS — Z113 Encounter for screening for infections with a predominantly sexual mode of transmission: Secondary | ICD-10-CM | POA: Diagnosis not present

## 2020-08-04 DIAGNOSIS — Z3A26 26 weeks gestation of pregnancy: Secondary | ICD-10-CM

## 2020-08-04 DIAGNOSIS — O210 Mild hyperemesis gravidarum: Secondary | ICD-10-CM

## 2020-08-04 MED ORDER — MAG-OXIDE 200 MG PO TABS: 400.0000 mg | ORAL_TABLET | Freq: Every day | ORAL | 3 refills | Status: DC

## 2020-08-04 NOTE — Progress Notes (Signed)
PRENATAL VISIT NOTE  Subjective:  Leah [name] is a 25 y.o. G2P1001 at [redacted]w[redacted]d being seen today for ongoing prenatal care.  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  Patient reports nausea, vomiting and pelvic pain with nighttime leg cramping.  Contractions: Not present. Vag. Bleeding: None.  Movement: Present. Denies leaking of fluid.   The following portions of the patient's history were reviewed and updated as appropriate: allergies, current medications, past family history, past medical history, past social history, past surgical history and problem list.   Objective:   Vitals:   08/04/20 0900  BP: 114/75  Pulse: 93  Weight: 136 lb 9.6 oz (62 kg)    Fetal Status: Fetal Heart Rate (bpm): 152 Fundal Height: 26 cm Movement: Present     General:  Alert, oriented and cooperative. Patient is in no acute distress.  Skin: Skin is warm and dry. No rash noted.   Cardiovascular: Normal heart rate noted  Respiratory: Normal respiratory effort, no problems with respiration noted  Abdomen: Soft, gravid, appropriate for gestational age.  Pain/Pressure: Present     Pelvic: Cervical exam deferred        Extremities: Normal range of motion.  Edema: None  Mental Status: Normal mood and affect. Normal behavior. Normal judgment and thought content.   Assessment and Plan:  Pregnancy: G2P1001 at [redacted]w[redacted]d 1. Supervision of high risk pregnancy, antepartum - Pt doing fairly well, but frustrated that she's missed phenergan infusions this week causing her  hyperemesis to worsen.  2. [redacted] weeks gestation of pregnancy - Routine OB care - Anticipatory guidance given regarding GTT, advised patient to take zofran that morning to prevent vomiting during the visit.  3. Hyperemesis affecting pregnancy, antepartum - Pt was getting phenergan infusions 4x weekly, the week before last was decreased to 3x weekly which she tolerated well.  - 3x weekly infusion orders placed, will reevaluate patient's hyperemesis status in one month  4. Vaginal itching - Cervicovaginal ancillary only( [place])  5. Screening for STD (sexually transmitted disease) - Cervicovaginal ancillary only( [place])  6. Pelvic pain affecting pregnancy in second trimester, antepartum - Pt was using her maternity belt but stopped because her pain got better (and then worse again). Encouraged daily use of her maternity belt. - Demonstrated round ligament massage and stretching to relieve pelvic pain/pressure - Magnesium Oxide (MAG-OXIDE) 200 MG TABS; Take 2 tablets (400 mg total) by mouth at bedtime. If that amount causes loose stools in the am, switch to 200mg  daily at bedtime.  Dispense: 60 tablet; Refill: 3  7. Leg cramps in pregnancy - Magnesium Oxide (MAG-OXIDE) 200 MG TABS; Take 2 tablets (400 mg total) by mouth at bedtime. If that amount causes loose stools in the am, switch to 200mg  daily at bedtime.  Dispense: 60 tablet; Refill: 3  Preterm labor symptoms and general obstetric precautions including but not limited to vaginal bleeding, contractions, leaking of fluid and fetal movement were reviewed in detail with the patient. Please refer to After Visit Summary for other counseling recommendations.   Return in about 2 weeks (around 08/18/2020) for IN-PERSON, LOB w GTT.  Future Appointments  Date Time  Provider Department Center  08/11/2020  9:00 AM [facility]-RM9 [facility] None  08/15/2020  8:30 AM [facility] NURSE [facility]  08/15/2020  8:45 AM [facility] US4 [facility]   08/18/2020  8:30 AM [facility]OCA LAB [facility]  08/18/2020  8:55 AM Bernerd Limbo, CNM [facility]  10/18/2020  8:30 AM Shanna Cisco, NP [facility] None    Bernerd Limbo, CNM

## 2020-08-07 LAB — CERVICOVAGINAL ANCILLARY ONLY
Bacterial Vaginitis (gardnerella): NEGATIVE
Candida Glabrata: NEGATIVE
Candida Vaginitis: POSITIVE — AB
Chlamydia: NEGATIVE
Comment: NEGATIVE
Comment: NEGATIVE
Comment: NEGATIVE
Comment: NEGATIVE
Comment: NEGATIVE
Comment: NORMAL
Neisseria Gonorrhea: NEGATIVE
Trichomonas: NEGATIVE

## 2020-08-08 ENCOUNTER — Other Ambulatory Visit: Payer: Self-pay

## 2020-08-08 DIAGNOSIS — B379 Candidiasis, unspecified: Secondary | ICD-10-CM

## 2020-08-08 MED ORDER — TERCONAZOLE 0.4 % VA CREA: 1.0000 | TOPICAL_CREAM | Freq: Every day | VAGINAL | 0 refills | Status: DC

## 2020-08-11 ENCOUNTER — Other Ambulatory Visit: Payer: Self-pay

## 2020-08-11 ENCOUNTER — Encounter (HOSPITAL_COMMUNITY)
Admission: RE | Admit: 2020-08-11 | Discharge: 2020-08-11 | Disposition: A | Discharge status: Home / Self Care | Payer: Medicaid Other | Source: Ambulatory Visit | Attending: Family Medicine | Admitting: Family Medicine

## 2020-08-11 DIAGNOSIS — O0990 Supervision of high risk pregnancy, unspecified, unspecified trimester: Secondary | ICD-10-CM | POA: Diagnosis not present

## 2020-08-11 DIAGNOSIS — O210 Mild hyperemesis gravidarum: Secondary | ICD-10-CM | POA: Diagnosis not present

## 2020-08-11 MED ORDER — PROMETHAZINE HCL 25 MG/ML IJ SOLN
25.0000 mg | INTRAVENOUS | Status: DC
  Administered 2020-08-11: 25 mg via INTRAVENOUS
  Filled 2020-08-11: qty 1

## 2020-08-14 ENCOUNTER — Other Ambulatory Visit: Payer: Self-pay

## 2020-08-14 ENCOUNTER — Encounter (HOSPITAL_COMMUNITY)
Admission: RE | Admit: 2020-08-14 | Discharge: 2020-08-14 | Disposition: A | Discharge status: Home / Self Care | Payer: Medicaid Other | Source: Ambulatory Visit | Attending: Family Medicine | Admitting: Family Medicine

## 2020-08-14 DIAGNOSIS — O210 Mild hyperemesis gravidarum: Secondary | ICD-10-CM | POA: Diagnosis not present

## 2020-08-14 DIAGNOSIS — O0990 Supervision of high risk pregnancy, unspecified, unspecified trimester: Secondary | ICD-10-CM

## 2020-08-14 MED ORDER — PROMETHAZINE HCL 25 MG/ML IJ SOLN
25.0000 mg | INTRAVENOUS | Status: DC
  Administered 2020-08-14: 25 mg via INTRAVENOUS
  Filled 2020-08-14: qty 1

## 2020-08-15 ENCOUNTER — Ambulatory Visit (HOSPITAL_BASED_OUTPATIENT_CLINIC_OR_DEPARTMENT_OTHER): Payer: Medicaid Other

## 2020-08-15 ENCOUNTER — Ambulatory Visit: Payer: Medicaid Other | Attending: Obstetrics and Gynecology | Admitting: *Deleted

## 2020-08-15 ENCOUNTER — Encounter: Payer: Self-pay | Admitting: *Deleted

## 2020-08-15 ENCOUNTER — Other Ambulatory Visit: Payer: Self-pay | Admitting: *Deleted

## 2020-08-15 DIAGNOSIS — G40909 Epilepsy, unspecified, not intractable, without status epilepticus: Secondary | ICD-10-CM | POA: Insufficient documentation

## 2020-08-15 DIAGNOSIS — O99343 Other mental disorders complicating pregnancy, third trimester: Secondary | ICD-10-CM | POA: Insufficient documentation

## 2020-08-15 DIAGNOSIS — R569 Unspecified convulsions: Secondary | ICD-10-CM

## 2020-08-15 DIAGNOSIS — Z362 Encounter for other antenatal screening follow-up: Secondary | ICD-10-CM

## 2020-08-15 DIAGNOSIS — O99353 Diseases of the nervous system complicating pregnancy, third trimester: Secondary | ICD-10-CM | POA: Insufficient documentation

## 2020-08-15 DIAGNOSIS — O210 Mild hyperemesis gravidarum: Secondary | ICD-10-CM | POA: Insufficient documentation

## 2020-08-15 DIAGNOSIS — O0990 Supervision of high risk pregnancy, unspecified, unspecified trimester: Secondary | ICD-10-CM

## 2020-08-15 DIAGNOSIS — Z3A28 28 weeks gestation of pregnancy: Secondary | ICD-10-CM

## 2020-08-15 DIAGNOSIS — O2693 Pregnancy related conditions, unspecified, third trimester: Secondary | ICD-10-CM | POA: Diagnosis not present

## 2020-08-15 DIAGNOSIS — F319 Bipolar disorder, unspecified: Secondary | ICD-10-CM | POA: Insufficient documentation

## 2020-08-15 DIAGNOSIS — F419 Anxiety disorder, unspecified: Secondary | ICD-10-CM | POA: Insufficient documentation

## 2020-08-16 ENCOUNTER — Encounter (HOSPITAL_COMMUNITY): Payer: Medicaid Other

## 2020-08-16 NOTE — Progress Notes (Signed)
Pt called the Infusion clinic today asking if she could receive her infusions without the phenergan.  She was instructed to call her physician to discuss this.  We would need new orders to infuse.  She canceled her infusion appt for today

## 2020-08-17 ENCOUNTER — Other Ambulatory Visit: Payer: Self-pay | Admitting: Certified Nurse Midwife

## 2020-08-17 ENCOUNTER — Other Ambulatory Visit: Payer: Self-pay

## 2020-08-17 ENCOUNTER — Encounter (HOSPITAL_COMMUNITY)
Admission: RE | Admit: 2020-08-17 | Discharge: 2020-08-17 | Disposition: A | Discharge status: Home / Self Care | Payer: Medicaid Other | Source: Ambulatory Visit | Attending: Family Medicine | Admitting: Family Medicine

## 2020-08-17 DIAGNOSIS — F319 Bipolar disorder, unspecified: Secondary | ICD-10-CM | POA: Diagnosis not present

## 2020-08-17 DIAGNOSIS — O210 Mild hyperemesis gravidarum: Secondary | ICD-10-CM

## 2020-08-17 NOTE — Progress Notes (Signed)
Patient came today for appointment and stated her Dr was supposed to order her fluids and vitamins and stop the phenergan but we had no orders, and no notes from the Dr regarding changes.  Patient stated she was going to leave and we asked her to get in touch with her Doctors office.

## 2020-08-17 NOTE — Progress Notes (Signed)
Via MyChart messages and one phone call, pt asked to stop the phenergan infusions but keep regular fluids/MVI infusions. She states the phenergan is not working, she often vomits while having an infusion. She is still unable to keep food down for very long. The phenergan helps her sleep at night but is not providing any relief. She reports that the fluids are helping her avoid constant lightheadedness and fatigue so she would like to continue those infusions.  Infusions corrected to fluids only 2x/week and MVI once weekly. Pt amenable to plan.  Edd Arbour, CNM, MSN, IBCLC Certified Nurse Midwife, [facility]

## 2020-08-18 ENCOUNTER — Other Ambulatory Visit: Payer: Medicaid Other

## 2020-08-18 ENCOUNTER — Ambulatory Visit: Payer: Medicaid Other

## 2020-08-18 ENCOUNTER — Other Ambulatory Visit: Payer: Self-pay

## 2020-08-18 ENCOUNTER — Ambulatory Visit (INDEPENDENT_AMBULATORY_CARE_PROVIDER_SITE_OTHER): Payer: Medicaid Other | Admitting: Certified Nurse Midwife

## 2020-08-18 VITALS — BP 118/75 | HR 99 | Wt 140.8 lb

## 2020-08-18 DIAGNOSIS — O0990 Supervision of high risk pregnancy, unspecified, unspecified trimester: Secondary | ICD-10-CM

## 2020-08-18 DIAGNOSIS — K644 Residual hemorrhoidal skin tags: Secondary | ICD-10-CM

## 2020-08-18 DIAGNOSIS — Z23 Encounter for immunization: Secondary | ICD-10-CM

## 2020-08-18 DIAGNOSIS — Z3A28 28 weeks gestation of pregnancy: Secondary | ICD-10-CM

## 2020-08-18 DIAGNOSIS — O210 Mild hyperemesis gravidarum: Secondary | ICD-10-CM

## 2020-08-18 NOTE — Progress Notes (Signed)
   PRENATAL VISIT NOTE  Subjective:  Leah [name] is a 25 y.o. G2P1001 at [redacted]w[redacted]d being seen today for ongoing prenatal care.  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  Patient reports doing well other than some rectal itching and continued hyperemesis. Has gained weight this week, but is continuing to be unable to keep much down.  Contractions: Not present. Vag. Bleeding: None.  Movement: Present. Denies leaking of fluid.   The following portions of the patient's history were reviewed and updated as appropriate: allergies, current medications, past family history, past medical history, past social history, past surgical history and problem list.   Objective:   Vitals:   08/18/20 0905  BP: 118/75  Pulse: 99  Weight: 140 lb 12.8 oz (63.9 kg)    Fetal Status: Fetal Heart Rate (bpm): 149 Fundal Height: 28 cm Movement: Present     General:  Alert, oriented and cooperative. Patient is in no acute distress.  Skin: Skin is warm and dry. No rash noted.   Cardiovascular: Normal heart rate noted  Respiratory: Normal respiratory effort, no problems with respiration noted  Abdomen: Soft, gravid, appropriate for gestational age.  Pain/Pressure: Absent     Pelvic: Cervical exam deferred       Small resolving hemorrhoid noted on anus, with associated skin tag  Extremities: Normal range of motion.  Edema: None  Mental Status: Normal mood and affect. Normal behavior. Normal judgment and thought content.   Assessment and Plan:  Pregnancy: G2P1001 at [redacted]w[redacted]d 1. Supervision of high risk pregnancy, antepartum - Doing well, feeling plenty of movement -  Tdap vaccine greater than or equal to 7yo IM  2. [redacted] weeks gestation of pregnancy - Routine OB care - GTT today with 28wk labs  3. Residual hemorrhoidal skin tags - advised pt to try Tucks pads on area   4. Hyperemesis gravidarum - Changed weekly infusions to LR 2x/wk and MVI 1x/wk. Pt prefers to avoid phenergan as it is not working, but the fluids help. Orders placed yesterday for the remainder of her pregnancy.  Preterm labor symptoms and general obstetric precautions including but not limited to vaginal bleeding, contractions, leaking of fluid and fetal movement were reviewed in detail with the patient. Please refer to After Visit Summary for other counseling recommendations.   Return in about 2 weeks (around 09/01/2020) for VIRTUAL, HROB.  Future Appointments  Date Time Provider Department Center  08/21/2020  8:00 AM [facility]-RM4 [facility] None  08/23/2020  8:00 AM [facility]INF-RM5 [facility] None  08/25/2020  8:00 AM MCINF-RM5 [facility] None  09/01/2020  8:15 AM Marylene Land, CNM [facility]  09/29/2020  8:30 AM W[facility]-MFC NURSE [facility]  09/29/2020  8:45 AM [facility]-M[facility] US4 [facility]  10/18/2020  8:30 AM Shanna Cisco, NP [facility] None   Edd Arbour, CNM, MSN, [facility] Certified Nurse Midwife, [facility]

## 2020-08-18 NOTE — Progress Notes (Signed)
BTL Consent signed-08/18/20

## 2020-08-18 NOTE — Patient Instructions (Signed)
Hemorrhoids Hemorrhoids are swollen veins that may develop:  In the butt (rectum). These are called internal hemorrhoids.  Around the opening of the butt (anus). These are called external hemorrhoids. Hemorrhoids can cause pain, itching, or bleeding. Most of the time, they do not cause serious problems. They usually get better with diet changes, lifestyle changes, and other home treatments. What are the causes? This condition may be caused by:  Having trouble pooping (constipation).  Pushing hard (straining) to poop.  Watery poop (diarrhea).  Pregnancy.  Being very overweight (obese).  Sitting for long periods of time.  Heavy lifting or other activity that causes you to strain.  Anal sex.  Riding a bike for a long period of time. What are the signs or symptoms? Symptoms of this condition include:  Pain.  Itching or soreness in the butt.  Bleeding from the butt.  Leaking poop.  Swelling in the area.  One or more lumps around the opening of your butt. How is this diagnosed? A doctor can often diagnose this condition by looking at the affected area. The doctor may also:  Do an exam that involves feeling the area with a gloved hand (digital rectal exam).  Examine the area inside your butt using a small tube (anoscope).  Order blood tests. This may be done if you have lost a lot of blood.  Have you get a test that involves looking inside the colon using a flexible tube with a camera on the end (sigmoidoscopy or colonoscopy). How is this treated? This condition can usually be treated at home. Your doctor may tell you to change what you eat, make lifestyle changes, or try home treatments. If these do not help, procedures can be done to remove the hemorrhoids or make them smaller. These may involve:  Placing rubber bands at the base of the hemorrhoids to cut off their blood supply.  Injecting medicine into the hemorrhoids to shrink them.  Shining a type of light  energy onto the hemorrhoids to cause them to fall off.  Doing surgery to remove the hemorrhoids or cut off their blood supply. Follow these instructions at home: Eating and drinking  Eat foods that have a lot of fiber in them. These include whole grains, beans, nuts, fruits, and vegetables.  Ask your doctor about taking products that have added fiber (fibersupplements).  Reduce the amount of fat in your diet. You can do this by: ? Eating low-fat dairy products. ? Eating less red meat. ? Avoiding processed foods.  Drink enough fluid to keep your pee (urine) pale yellow.   Managing pain and swelling  Take a warm-water bath (sitz bath) for 20 minutes to ease pain. Do this 3-4 times a day. You may do this in a bathtub or using a portable sitz bath that fits over the toilet.  If told, put ice on the painful area. It may be helpful to use ice between your warm baths. ? Put ice in a plastic bag. ? Place a towel between your skin and the bag. ? Leave the ice on for 20 minutes, 2-3 times a day.   General instructions  Take over-the-counter and prescription medicines only as told by your doctor. ? Medicated creams and medicines may be used as told.  Exercise often. Ask your doctor how much and what kind of exercise is best for you.  Go to the bathroom when you have the urge to poop. Do not wait.  Avoid pushing too hard when you poop.    Keep your butt dry and clean. Use wet toilet paper or moist towelettes after pooping.  Do not sit on the toilet for a long time.  Keep all follow-up visits as told by your doctor. This is important. Contact a doctor if you:  Have pain and swelling that do not get better with treatment or medicine.  Have trouble pooping.  Cannot poop.  Have pain or swelling outside the area of the hemorrhoids. Get help right away if you have:  Bleeding that will not stop. Summary  Hemorrhoids are swollen veins in the butt or around the opening of the  butt.  They can cause pain, itching, or bleeding.  Eat foods that have a lot of fiber in them. These include whole grains, beans, nuts, fruits, and vegetables.  Take a warm-water bath (sitz bath) for 20 minutes to ease pain. Do this 3-4 times a day. This information is not intended to replace advice given to you by your health care provider. Make sure you discuss any questions you have with your health care provider. Document Revised: 07/16/2018 Document Reviewed: 11/27/2017 Elsevier Patient Education  2021 Elsevier Inc.  

## 2020-08-19 LAB — CBC
Hematocrit: 30.7 % — ABNORMAL LOW (ref 34.0–46.6)
Hemoglobin: 10.3 g/dL — ABNORMAL LOW (ref 11.1–15.9)
MCH: 29.9 pg (ref 26.6–33.0)
MCHC: 33.6 g/dL (ref 31.5–35.7)
MCV: 89 fL (ref 79–97)
Platelets: 264 10*3/uL (ref 150–450)
RBC: 3.44 x10E6/uL — ABNORMAL LOW (ref 3.77–5.28)
RDW: 12.9 % (ref 11.7–15.4)
WBC: 7.8 10*3/uL (ref 3.4–10.8)

## 2020-08-19 LAB — HIV ANTIBODY (ROUTINE TESTING W REFLEX): HIV Screen 4th Generation wRfx: NONREACTIVE

## 2020-08-19 LAB — GLUCOSE TOLERANCE, 2 HOURS W/ 1HR
Glucose, 1 hour: 136 mg/dL (ref 65–179)
Glucose, 2 hour: 107 mg/dL (ref 65–152)
Glucose, Fasting: 71 mg/dL (ref 65–91)

## 2020-08-19 LAB — RPR: RPR Ser Ql: NONREACTIVE

## 2020-08-20 ENCOUNTER — Encounter (HOSPITAL_COMMUNITY): Payer: Self-pay | Admitting: Obstetrics & Gynecology

## 2020-08-20 ENCOUNTER — Inpatient Hospital Stay (HOSPITAL_COMMUNITY)
Admission: AD | Admit: 2020-08-20 | Discharge: 2020-08-20 | Disposition: A | Discharge status: Home / Self Care | Payer: Medicaid Other | Source: Ambulatory Visit | Attending: Obstetrics & Gynecology | Admitting: Obstetrics & Gynecology

## 2020-08-20 ENCOUNTER — Other Ambulatory Visit: Payer: Self-pay

## 2020-08-20 DIAGNOSIS — O26893 Other specified pregnancy related conditions, third trimester: Secondary | ICD-10-CM | POA: Diagnosis not present

## 2020-08-20 DIAGNOSIS — O26899 Other specified pregnancy related conditions, unspecified trimester: Secondary | ICD-10-CM

## 2020-08-20 DIAGNOSIS — Z3A28 28 weeks gestation of pregnancy: Secondary | ICD-10-CM | POA: Diagnosis not present

## 2020-08-20 DIAGNOSIS — R109 Unspecified abdominal pain: Secondary | ICD-10-CM

## 2020-08-20 DIAGNOSIS — R102 Pelvic and perineal pain: Secondary | ICD-10-CM | POA: Diagnosis not present

## 2020-08-20 LAB — URINALYSIS, ROUTINE W REFLEX MICROSCOPIC
Bacteria, UA: NONE SEEN
Bilirubin Urine: NEGATIVE
Glucose, UA: NEGATIVE mg/dL
Hgb urine dipstick: NEGATIVE
Ketones, ur: NEGATIVE mg/dL
Nitrite: NEGATIVE
Protein, ur: NEGATIVE mg/dL
Specific Gravity, Urine: 1.021 (ref 1.005–1.030)
pH: 7 (ref 5.0–8.0)

## 2020-08-20 NOTE — Discharge Instructions (Signed)

## 2020-08-20 NOTE — MAU Provider Note (Signed)
History     CSN: [id number]  Arrival date and time: 08/20/20 1519   Event Date/Time   First Provider Initiated Contact with Patient 08/20/20 1603      Chief Complaint  Patient presents with  . Abdominal Pain   HPI Leah [name] is a 25 y.o. G2P1001 at [redacted]w[redacted]d who presents with abdominal pain. Symptoms started last night. Initially had tightening pain every 5-8 minutes that lasted for an hour. Since then has had constant lower pelvic pain. Pain is worse with walking and movement. Rates pain 8/10. Hasn't treated symptoms. Denies loss of fluid or vaginal bleeding. Reports good fetal movement.   OB History    Gravida  2   Para  1   Term  1   Preterm  0   AB  0   Living  1     SAB  0   IAB  0   Ectopic  0   Multiple      Live Births  1           Past Medical History:  Diagnosis Date  . Anemia 2013  . Bipolar 1 disorder (HCC)   . Gall stones   . GERD (gastroesophageal reflux disease)   . 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 CHOLECYSTECTOMY N/A 09/28/2014   Procedure: LAPAROSCOPIC CHOLECYSTECTOMY;  Surgeon: Axel Filler, MD;  Location: [facility] OR;  Service: General;  Laterality: N/A;  . DIRECT LARYNGOSCOPY N/A 04/24/2014   Procedure: DIRECT LARYNGOSCOPY;  Surgeon: [name], MD;  Location: [facility] OR;  Service: ENT;  Laterality: N/A;  . FOREIGN BODY REMOVAL ESOPHAGEAL N/A 04/24/2014   Procedure: REMOVAL FOREIGN BODY ESOPHAGEAL;  Surgeon: [name], MD;  Location: [facility] OR;  Service: ENT;  Laterality: N/A;  . WISDOM TOOTH EXTRACTION  2013    Family History  Problem Relation Age of Onset  . Arthritis Mother   . Bronchitis Mother   . Asthma Mother   . Hearing loss Paternal Grandfather   . Diabetes Neg Hx   . Stomach cancer Neg Hx   . Colon cancer Neg Hx     Social History   Tobacco Use  . Smoking status: Never Smoker  . Smokeless tobacco: Never Used  . Tobacco comment: marjiuna  Vaping Use  . Vaping  Use: Never used  Substance Use Topics  . Alcohol use: Not Currently    Alcohol/week: 6.0 standard drinks    Types: 6 Cans of beer per week  . Drug use: Yes    Types: Marijuana    Comment: last used before pregnancy    Allergies:  Allergies  Allergen Reactions  . Bactrim [Sulfamethoxazole-Trimethoprim] Rash    No medications prior to admission.    Review of Systems  Constitutional: Negative.   Gastrointestinal: Positive for abdominal pain, nausea and vomiting. Negative for constipation and diarrhea.  Genitourinary: Negative.    Physical Exam   Blood pressure 115/65, pulse 96, temperature 98.8 F (37.1 C), temperature source Oral, resp. rate 16, last menstrual period 02/01/2020, SpO2 99 %.  Physical Exam Vitals and nursing note reviewed.  Constitutional:      General: She is not in acute distress.    Appearance: She is well-developed.  HENT:     Head: Normocephalic and atraumatic.  Pulmonary:     Effort: Pulmonary effort is normal. No respiratory distress.  Abdominal:     Palpations: Abdomen is soft.  Tenderness: There is abdominal tenderness in the suprapubic area. There is no guarding.     Comments: Gravid uterus  Genitourinary:    Comments: Dilation: Closed Effacement (%): Thick Cervical Position: Posterior Station: -3 Exam by:: Judeth Horn NP  Skin:    General: Skin is warm and dry.  Neurological:     Mental Status: She is alert.      NST:  Baseline: 140 bpm, Variability: Good {> 6 bpm), Accelerations: Non-reactive but appropriate for gestational age, Decelerations: Absent and no contractions  MAU Course  Procedures Results for orders placed or performed during the hospital encounter of 08/20/20 (from the past 24 hour(s))  Urinalysis, Routine w reflex microscopic Urine, Clean Catch     Status: Abnormal   Collection Time: 08/20/20  3:30 PM  Result Value Ref Range   Color, Urine YELLOW YELLOW   APPearance HAZY (A) CLEAR   Specific Gravity, Urine  1.021 1.005 - 1.030   pH 7.0 5.0 - 8.0   Glucose, UA NEGATIVE NEGATIVE mg/dL   Hgb urine dipstick NEGATIVE NEGATIVE   Bilirubin Urine NEGATIVE NEGATIVE   Ketones, ur NEGATIVE NEGATIVE mg/dL   Protein, ur NEGATIVE NEGATIVE mg/dL   Nitrite NEGATIVE NEGATIVE   Leukocytes,Ua TRACE (A) NEGATIVE   RBC / HPF 0-5 0 - 5 RBC/hpf   WBC, UA 0-5 0 - 5 WBC/hpf   Bacteria, UA NONE SEEN NONE SEEN   Squamous Epithelial / LPF 11-20 0 - 5   Mucus PRESENT     MDM Reactive NST. No contractions on TOCO. Abdomen soft & non tender. Cervix closed/thick.  Description of symptoms consistent with round ligament pain. Patient says she recently purchased a maternity support belt - recommend regular use. Discussed if symptoms worsen she should get referral to PT from her ob/gyn.   Assessment and Plan   1. Pain of round ligament affecting pregnancy, antepartum  -continue maternity support belt -f/u with ob/gyn if symptoms don't improved -reviewed PTL precautions & reasons to return to MAU  2. [redacted] weeks gestation of pregnancy      Judeth Horn 08/20/2020, 4:46 PM

## 2020-08-20 NOTE — MAU Note (Signed)
Pt reports to mau with c/o constant sharp lower abd pain that has been ongoing since last night.  Pt states she got some relief last night but reports it is more painful today.  Pt states pain is worse with movement and turning.  Denies vag bleeding or LOF, +FM

## 2020-08-21 ENCOUNTER — Encounter (HOSPITAL_COMMUNITY)
Admission: RE | Admit: 2020-08-21 | Discharge: 2020-08-21 | Disposition: A | Discharge status: Home / Self Care | Payer: Medicaid Other | Source: Ambulatory Visit | Attending: Family Medicine | Admitting: Family Medicine

## 2020-08-21 DIAGNOSIS — O210 Mild hyperemesis gravidarum: Secondary | ICD-10-CM | POA: Diagnosis not present

## 2020-08-21 DIAGNOSIS — O0990 Supervision of high risk pregnancy, unspecified, unspecified trimester: Secondary | ICD-10-CM | POA: Diagnosis not present

## 2020-08-21 MED ORDER — LACTATED RINGERS IV BOLUS
1000.0000 mL | INTRAVENOUS | Status: DC
  Administered 2020-08-21: 1000 mL via INTRAVENOUS

## 2020-08-23 ENCOUNTER — Inpatient Hospital Stay (HOSPITAL_COMMUNITY)
Admission: RE | Admit: 2020-08-23 | Discharge: 2020-08-23 | Disposition: A | Discharge status: Home / Self Care | Payer: Medicaid Other | Source: Ambulatory Visit | Attending: Family Medicine | Admitting: Family Medicine

## 2020-08-23 NOTE — Progress Notes (Signed)
Pt was a no show/no call for IVF appt today at [facility]

## 2020-08-24 ENCOUNTER — Other Ambulatory Visit: Payer: Self-pay | Admitting: Certified Nurse Midwife

## 2020-08-24 DIAGNOSIS — O26893 Other specified pregnancy related conditions, third trimester: Secondary | ICD-10-CM

## 2020-08-24 DIAGNOSIS — R102 Pelvic and perineal pain: Secondary | ICD-10-CM

## 2020-08-24 NOTE — Progress Notes (Signed)
Pt having pelvic pain in pregnancy, using support belt daily and doing round ligament massages/stretches. Order placed for pelvic floor physical therapy.  Edd Arbour, CNM, MSN, IBCLC Certified Nurse Midwife, [facility]

## 2020-08-25 ENCOUNTER — Inpatient Hospital Stay (HOSPITAL_BASED_OUTPATIENT_CLINIC_OR_DEPARTMENT_OTHER): Payer: Medicaid Other

## 2020-08-25 ENCOUNTER — Encounter (HOSPITAL_COMMUNITY): Payer: Self-pay | Admitting: Obstetrics and Gynecology

## 2020-08-25 ENCOUNTER — Other Ambulatory Visit: Payer: Self-pay

## 2020-08-25 ENCOUNTER — Ambulatory Visit (HOSPITAL_COMMUNITY)
Admission: RE | Admit: 2020-08-25 | Discharge: 2020-08-25 | Disposition: A | Discharge status: Home / Self Care | Payer: Medicaid Other | Source: Ambulatory Visit | Attending: Family Medicine | Admitting: Family Medicine

## 2020-08-25 ENCOUNTER — Inpatient Hospital Stay (HOSPITAL_COMMUNITY)
Admission: AD | Admit: 2020-08-25 | Discharge: 2020-08-26 | Disposition: A | Discharge status: Home / Self Care | Payer: Medicaid Other | Attending: Obstetrics and Gynecology | Admitting: Obstetrics and Gynecology

## 2020-08-25 DIAGNOSIS — O9A213 Injury, poisoning and certain other consequences of external causes complicating pregnancy, third trimester: Secondary | ICD-10-CM

## 2020-08-25 DIAGNOSIS — W19XXXA Unspecified fall, initial encounter: Secondary | ICD-10-CM

## 2020-08-25 DIAGNOSIS — O99343 Other mental disorders complicating pregnancy, third trimester: Secondary | ICD-10-CM | POA: Diagnosis not present

## 2020-08-25 DIAGNOSIS — W1839XA Other fall on same level, initial encounter: Secondary | ICD-10-CM | POA: Diagnosis not present

## 2020-08-25 DIAGNOSIS — Z3689 Encounter for other specified antenatal screening: Secondary | ICD-10-CM

## 2020-08-25 DIAGNOSIS — Z3A29 29 weeks gestation of pregnancy: Secondary | ICD-10-CM | POA: Diagnosis not present

## 2020-08-25 DIAGNOSIS — R102 Pelvic and perineal pain: Secondary | ICD-10-CM | POA: Insufficient documentation

## 2020-08-25 DIAGNOSIS — O0990 Supervision of high risk pregnancy, unspecified, unspecified trimester: Secondary | ICD-10-CM

## 2020-08-25 DIAGNOSIS — W010XXA Fall on same level from slipping, tripping and stumbling without subsequent striking against object, initial encounter: Secondary | ICD-10-CM | POA: Diagnosis not present

## 2020-08-25 DIAGNOSIS — O288 Other abnormal findings on antenatal screening of mother: Secondary | ICD-10-CM

## 2020-08-25 DIAGNOSIS — O99353 Diseases of the nervous system complicating pregnancy, third trimester: Secondary | ICD-10-CM

## 2020-08-25 DIAGNOSIS — O4693 Antepartum hemorrhage, unspecified, third trimester: Secondary | ICD-10-CM | POA: Insufficient documentation

## 2020-08-25 DIAGNOSIS — R569 Unspecified convulsions: Secondary | ICD-10-CM

## 2020-08-25 DIAGNOSIS — T1490XA Injury, unspecified, initial encounter: Secondary | ICD-10-CM

## 2020-08-25 DIAGNOSIS — F319 Bipolar disorder, unspecified: Secondary | ICD-10-CM | POA: Insufficient documentation

## 2020-08-25 DIAGNOSIS — G40909 Epilepsy, unspecified, not intractable, without status epilepticus: Secondary | ICD-10-CM | POA: Diagnosis not present

## 2020-08-25 DIAGNOSIS — O210 Mild hyperemesis gravidarum: Secondary | ICD-10-CM

## 2020-08-25 DIAGNOSIS — O26893 Other specified pregnancy related conditions, third trimester: Secondary | ICD-10-CM | POA: Diagnosis not present

## 2020-08-25 LAB — URINALYSIS, ROUTINE W REFLEX MICROSCOPIC
Bilirubin Urine: NEGATIVE
Glucose, UA: NEGATIVE mg/dL
Hgb urine dipstick: NEGATIVE
Ketones, ur: NEGATIVE mg/dL
Leukocytes,Ua: NEGATIVE
Nitrite: NEGATIVE
Protein, ur: NEGATIVE mg/dL
Specific Gravity, Urine: 1.013 (ref 1.005–1.030)
pH: 6 (ref 5.0–8.0)

## 2020-08-25 MED ORDER — CALCIUM CARBONATE ANTACID 500 MG PO CHEW
400.0000 mg | CHEWABLE_TABLET | Freq: Once | ORAL | Status: AC
  Administered 2020-08-26: 400 mg via ORAL
  Filled 2020-08-25: qty 2

## 2020-08-25 MED ORDER — CYCLOBENZAPRINE HCL 5 MG PO TABS
10.0000 mg | ORAL_TABLET | Freq: Once | ORAL | Status: AC
  Administered 2020-08-25: 10 mg via ORAL
  Filled 2020-08-25: qty 2

## 2020-08-25 MED ORDER — ACETAMINOPHEN 325 MG PO TABS
650.0000 mg | ORAL_TABLET | Freq: Once | ORAL | Status: AC
  Administered 2020-08-25: 650 mg via ORAL
  Filled 2020-08-25: qty 2

## 2020-08-25 MED ORDER — M.V.I. ADULT IV INJ
INTRAVENOUS | Status: DC
  Filled 2020-08-25: qty 10

## 2020-08-25 NOTE — MAU Note (Signed)
Patient reports to triage for a fall about 1-3hrs ago. Patient unable to remember time of fall due to phone being dead. Patient reports tripping over her dogs leash and falling direct belly first on the concrete. Patient states she hasn't felt baby move since this morning. Patient states she had dark red bleeding directly after the fall that has since resolved. Patient states after getting out the shower she also may have had some leaking of fluid on her couch that has also resolved. Patient reports pain being in lower abdomen and groin area 10/10. Patient also reports she has since been having a difficulty walking since the fall. Patient denies fever or acute illness.

## 2020-08-25 NOTE — MAU Provider Note (Signed)
History     CSN: [id number]  Arrival date and time: 08/25/20 2054   Event Date/Time   First Provider Initiated Contact with Patient 08/25/20 2210      Chief Complaint  Patient presents with  . Fall  . Pelvic Pain   Leah [name] is a 25 y.o. G2P1001 at [redacted]w[redacted]d who receives care at [facility].  She presents today for Fall and Pelvic Pain.  She reports she was walking her dog and tripped over the leash.  She reports she landed on the concrete directly on her abdomen.  She states after the fall she noticed some bleeding but only "a little." She states it was with wiping and in her underwear, however she did not note any blood upon arrival to the hospital.  Patient endorses pain and reports it is in her lower abdomen and her thighs.  She states the pain is constant and that she has a "sharp pain every now and then."  She reports the constant pain is "just tight."  She rates the pain a 10/10.  She has not taken anything for the pain and reports it is worsened with "moving, walking, lifting my legs."  Patient denies any relieving factors.  Patient endorses fetal movement and denies abdominal cramping or contractions.     OB History    Gravida  2   Para  1   Term  1   Preterm  0   AB  0   Living  1     SAB  0   IAB  0   Ectopic  0   Multiple      Live Births  1           Past Medical History:  Diagnosis Date  . Anemia 2013  . Bipolar 1 disorder ([facility])   . Gall stones   . GERD (gastroesophageal reflux disease)   . 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 CHOLECYSTECTOMY N/A 09/28/2014   Procedure: LAPAROSCOPIC CHOLECYSTECTOMY;  Surgeon: Ralene Ok, MD;  Location: [place];  Service: General;  Laterality: N/A;  . DIRECT LARYNGOSCOPY N/A 04/24/2014   Procedure: DIRECT LARYNGOSCOPY;  Surgeon: Ascencion Dike, MD;  Location: [place];  Service: ENT;  Laterality: N/A;  . FOREIGN BODY REMOVAL ESOPHAGEAL N/A 04/24/2014   Procedure:  REMOVAL FOREIGN BODY ESOPHAGEAL;  Surgeon: Ascencion Dike, MD;  Location: [facility] OR;  Service: ENT;  Laterality: N/A;  . WISDOM TOOTH EXTRACTION  2013    Family History  Problem Relation Age of Onset  . Arthritis Mother   . Bronchitis Mother   . Asthma Mother   . Hearing loss Paternal Grandfather   . Diabetes Neg Hx   . Stomach cancer Neg Hx   . Colon cancer Neg Hx     Social History   Tobacco Use  . Smoking status: Never Smoker  . Smokeless tobacco: Never Used  . Tobacco comment: marjiuna  Vaping Use  . Vaping Use: Never used  Substance Use Topics  . Alcohol use: Not Currently    Alcohol/week: 6.0 standard drinks    Types: 6 Cans of beer per week  . Drug use: Yes    Types: Marijuana    Comment: last used before pregnancy    Allergies:  Allergies  Allergen Reactions  . Bactrim [Sulfamethoxazole-Trimethoprim] Rash    Medications Prior to Admission  Medication Sig Dispense Refill Last Dose  . ondansetron (ZOFRAN ODT) 8  MG disintegrating tablet Take 1 tablet (8 mg total) by mouth every 8 (eight) hours as needed for nausea or vomiting. 40 tablet 0 08/24/2020 at Unknown time  . scopolamine (TRANSDERM-SCOP) 1 MG/3DAYS Place 1 patch onto the skin every 3 (three) days.   08/25/2020 at Unknown time  . Blood Pressure Monitoring (BLOOD PRESSURE KIT) DEVI 1 Device by Does not apply route as needed. 1 each 0     Review of Systems Physical Exam   Blood pressure (!) 96/55, pulse 85, temperature 98 F (36.7 C), temperature source Oral, resp. rate 19, last menstrual period 02/01/2020, SpO2 99 %.  Physical Exam Constitutional:      Appearance: Normal appearance.  HENT:     Head: Normocephalic and atraumatic.  Eyes:     Conjunctiva/sclera: Conjunctivae normal.  Cardiovascular:     Rate and Rhythm: Normal rate and regular rhythm.     Heart sounds: Normal heart sounds.  Pulmonary:     Effort: Pulmonary effort is normal. No respiratory distress.     Breath sounds: Normal breath sounds.   Abdominal:     General: Bowel sounds are normal.  Musculoskeletal:        General: Normal range of motion.  Skin:    General: Skin is warm and dry.  Neurological:     Mental Status: She is alert and oriented to person, place, and time.  Psychiatric:        Mood and Affect: Mood normal.        Behavior: Behavior normal.        Thought Content: Thought content normal.     Fetal Assessment 135 bpm, Mod Var, -Decels, +Accels Toco: Occasional Irritability  MAU Course   Results for orders placed or performed during the hospital encounter of 08/25/20 (from the past 24 hour(s))  Urinalysis, Routine w reflex microscopic Urine, Clean Catch     Status: Abnormal   Collection Time: 08/25/20  9:34 PM  Result Value Ref Range   Color, Urine YELLOW YELLOW   APPearance HAZY (A) CLEAR   Specific Gravity, Urine 1.013 1.005 - 1.030   pH 6.0 5.0 - 8.0   Glucose, UA NEGATIVE NEGATIVE mg/dL   Hgb urine dipstick NEGATIVE NEGATIVE   Bilirubin Urine NEGATIVE NEGATIVE   Ketones, ur NEGATIVE NEGATIVE mg/dL   Protein, ur NEGATIVE NEGATIVE mg/dL   Nitrite NEGATIVE NEGATIVE   Leukocytes,Ua NEGATIVE NEGATIVE    MDM PE Labs: UA EFM Limited US Assessment and Plan  25 year old G2P1001  SIUP at 29.3weeks Cat I FT S/P Traumatic Injury-Fall Vaginal Bleeding-Resolved  -POC Reviewed -Exam performed and findings discussed. -Reassured that no s/s of vaginal/cervical bleeding noted. -Will give muscle relaxant for aches. Cautioned that aches and pains will continue for next 48-72 hours.  -Will send for limited US to r/o abruption d/t patient c/o vaginal bleeding s/p fall. -Will await results.    Maryann Conners MSN, CNM 08/25/2020, 10:11 PM   Reassessment (11:27 PM)  -[place] images and report reviewed. Preliminary results without concern for previa or abruption.  -Provider to bedside to discuss with patient.  -Patient reports no improvement in pain and onset of heartburn. -Provider offers tums and  patient accepts. -Patient states she has not felt fetal movement since prior to arrival. -Informed that BPP was 8/8 and fetal movement witnessed on ultrasound images. -Will continue to monitor for ~ one hour and then reassess.   Reassessment (12:14 AM)  -Fetal tracing remains reactive.  -Encouraged to call or return  to MAU if symptoms worsen or with the onset of new symptoms. -Discharged to home in stable condition.  Maryann Conners MSN, CNM Advanced Practice Provider, [facility]

## 2020-08-28 ENCOUNTER — Ambulatory Visit (INDEPENDENT_AMBULATORY_CARE_PROVIDER_SITE_OTHER): Payer: Medicaid Other | Admitting: *Deleted

## 2020-08-28 ENCOUNTER — Other Ambulatory Visit: Payer: Self-pay

## 2020-08-28 ENCOUNTER — Ambulatory Visit: Payer: Medicaid Other

## 2020-08-28 ENCOUNTER — Encounter: Payer: Self-pay | Admitting: *Deleted

## 2020-08-28 VITALS — BP 106/65 | HR 85 | Ht 60.0 in | Wt 142.2 lb

## 2020-08-28 DIAGNOSIS — Z23 Encounter for immunization: Secondary | ICD-10-CM

## 2020-08-28 DIAGNOSIS — O26893 Other specified pregnancy related conditions, third trimester: Secondary | ICD-10-CM

## 2020-08-28 DIAGNOSIS — O0990 Supervision of high risk pregnancy, unspecified, unspecified trimester: Secondary | ICD-10-CM | POA: Diagnosis not present

## 2020-08-28 DIAGNOSIS — W19XXXS Unspecified fall, sequela: Secondary | ICD-10-CM

## 2020-08-28 DIAGNOSIS — R102 Pelvic and perineal pain: Secondary | ICD-10-CM

## 2020-08-28 MED ORDER — CYCLOBENZAPRINE HCL 10 MG PO TABS: 10.0000 mg | ORAL_TABLET | Freq: Three times a day (TID) | ORAL | 0 refills | Status: DC | PRN

## 2020-08-29 ENCOUNTER — Ambulatory Visit (HOSPITAL_COMMUNITY): Payer: Medicaid Other

## 2020-08-30 ENCOUNTER — Inpatient Hospital Stay (HOSPITAL_COMMUNITY)
Admission: RE | Admit: 2020-08-30 | Discharge: 2020-08-30 | Disposition: A | Discharge status: Home / Self Care | Payer: Medicaid Other | Source: Ambulatory Visit | Attending: Family Medicine | Admitting: Family Medicine

## 2020-08-30 NOTE — Progress Notes (Signed)
Patient was seen as scheduled on 2/7 for flu vaccine as scheduled. This was administered and pt tolerated well. During the visit, pt stated that she had been seen @ MAU on 2/4 following a fall on her abdomen. She reports continued pain and soreness on LLQ abdomen which has not changed since the visit to MAU. She stated that she was told she would receive Rx for Flexeril however this was not done. Per consult w/Dr. Donavan Foil, Rx for flexeril was e-prescribed to pt's preferred pharmacy. Dosing instructions were discussed w/pt. She voiced understanding of all information and instructions given.

## 2020-08-30 NOTE — Progress Notes (Signed)
Per department policy, due to no show/no call on 08/30/20, appointment scheduled for Friday, 09/01/20, was canceled.  Received phone call from patient asking why her appt for 09/01/20 was canceled when she did not call to cancel.  Explained to the patient that she did not call or show for today's appt and we cannot hold spots due to department scheduling needs.  Pt instructed to call when not coming.  Attempted to reschedule appt but explained to pt that it may not be for Friday.  At his point, pt hung up

## 2020-08-30 NOTE — Progress Notes (Signed)
Pt was a no show/no call for IVF appt today at the [facility]

## 2020-08-30 NOTE — Progress Notes (Signed)
Patient was assessed and managed by nursing staff during this encounter. I have reviewed the chart and agree with the documentation and plan. I have also made any necessary editorial changes.  Warden Fillers, MD 08/30/2020 1:34 PM

## 2020-08-31 DIAGNOSIS — F319 Bipolar disorder, unspecified: Secondary | ICD-10-CM | POA: Diagnosis not present

## 2020-09-01 ENCOUNTER — Ambulatory Visit (HOSPITAL_COMMUNITY): Payer: Medicaid Other

## 2020-09-01 ENCOUNTER — Telehealth (INDEPENDENT_AMBULATORY_CARE_PROVIDER_SITE_OTHER): Payer: Medicaid Other | Admitting: Student

## 2020-09-01 DIAGNOSIS — K219 Gastro-esophageal reflux disease without esophagitis: Secondary | ICD-10-CM | POA: Diagnosis not present

## 2020-09-01 DIAGNOSIS — O0990 Supervision of high risk pregnancy, unspecified, unspecified trimester: Secondary | ICD-10-CM

## 2020-09-01 DIAGNOSIS — F319 Bipolar disorder, unspecified: Secondary | ICD-10-CM | POA: Diagnosis not present

## 2020-09-01 DIAGNOSIS — O99013 Anemia complicating pregnancy, third trimester: Secondary | ICD-10-CM

## 2020-09-01 DIAGNOSIS — Z3A30 30 weeks gestation of pregnancy: Secondary | ICD-10-CM | POA: Diagnosis not present

## 2020-09-01 DIAGNOSIS — D509 Iron deficiency anemia, unspecified: Secondary | ICD-10-CM

## 2020-09-01 DIAGNOSIS — O99343 Other mental disorders complicating pregnancy, third trimester: Secondary | ICD-10-CM

## 2020-09-01 DIAGNOSIS — F322 Major depressive disorder, single episode, severe without psychotic features: Secondary | ICD-10-CM | POA: Diagnosis not present

## 2020-09-01 NOTE — Progress Notes (Signed)
Patient ID: Leah [name], female   DOB: 12-09-1995, 25 y.o.   MRN: [id number] I connected with Leah [name] 09/01/20 at  8:15 AM EST by: MyChart video and verified that I am speaking with the correct person using two identifiers.  Patient is located at home and provider is located at  [facility] She is currently getting infusion of vitamins and IV fluids three times a week. No doing phenergan infusions any more.    The purpose of this virtual visit is to provide medical care while limiting exposure to the novel coronavirus. I discussed the limitations, risks, security and privacy concerns of performing an evaluation and management service by MyChart video and the availability of in person appointments. I also discussed with the patient that there may be a patient responsible charge related to this service. By engaging in this virtual visit, you consent to the provision of healthcare.  Additionally, you authorize for your insurance to be billed for the services provided during this visit.  The patient expressed understanding and agreed to proceed.  The following staff members participated in the virtual visit:  Corinda Gubler   PRENATAL VISIT NOTE  Subjective:  Leah [name] is a 25 y.o. G2P1001 at [redacted]w[redacted]d  for phone visit for ongoing prenatal care.  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  Patient reports no complaints.  Contractions: Not present. Vag. Bleeding: None.  Movement: Present. Denies leaking of fluid.   The following portions of the patient's history were reviewed and updated as appropriate: allergies, current medications, past family history, past  medical history, past social history, past surgical history and problem list.   Objective:  There were no vitals filed for this visit. Self-Obtained  Fetal Status:     Movement: Present     Assessment and Plan:  Pregnancy: G2P1001 at [redacted]w[redacted]d 1. Supervision of high risk pregnancy, antepartum -patient not on camera; she states that she is going to see her therapist every two weeks; last appt was yesterday -reviewed glucose test results (passed)  -does not want to take medicine for her depression   Preterm labor symptoms and general obstetric precautions including but not limited to vaginal bleeding, contractions, leaking of fluid and fetal movement were reviewed in detail with the patient.  Return in about 3 weeks (around 09/22/2020), or LROB on My Chart.  Future Appointments  Date Time Provider Department Center  09/29/2020  8:30 AM [facility] NURSE [facility]  09/29/2020  8:45 AM [facility] US4 [facility]  10/09/2020  8:00 AM Desenglau, Shireen Quan, PT [facility]  10/18/2020  8:30 AM Shanna Cisco, NP [facility] None     Time spent on virtual visit: 11 minutes  Marylene Land, CNM

## 2020-09-01 NOTE — Patient Instructions (Signed)

## 2020-09-01 NOTE — Progress Notes (Signed)
I connected with  Leah [name] on 09/01/20 at  8:15 AM EST by telephone and verified that I am speaking with the correct person using two identifiers.   I discussed the limitations, risks, security and privacy concerns of performing an evaluation and management service by telephone and the availability of in person appointments. I also discussed with the patient that there may be a patient responsible charge related to this service. The patient expressed understanding and agreed to proceed.  [name], CMA 09/01/2020  8:23 AM

## 2020-09-05 ENCOUNTER — Other Ambulatory Visit: Payer: Self-pay

## 2020-09-05 ENCOUNTER — Ambulatory Visit (HOSPITAL_COMMUNITY)
Admission: RE | Admit: 2020-09-05 | Discharge: 2020-09-05 | Disposition: A | Discharge status: Home / Self Care | Payer: Medicaid Other | Source: Ambulatory Visit | Attending: Family Medicine | Admitting: Family Medicine

## 2020-09-05 DIAGNOSIS — O210 Mild hyperemesis gravidarum: Secondary | ICD-10-CM | POA: Diagnosis not present

## 2020-09-05 MED ORDER — LACTATED RINGERS IV BOLUS
1000.0000 mL | INTRAVENOUS | Status: DC
  Administered 2020-09-05: 1000 mL via INTRAVENOUS

## 2020-09-06 ENCOUNTER — Encounter (HOSPITAL_COMMUNITY)
Admission: RE | Admit: 2020-09-06 | Discharge: 2020-09-06 | Disposition: A | Discharge status: Home / Self Care | Payer: Medicaid Other | Source: Ambulatory Visit | Attending: Student | Admitting: Student

## 2020-09-06 ENCOUNTER — Encounter: Payer: Self-pay | Admitting: Lactation Services

## 2020-09-06 DIAGNOSIS — O210 Mild hyperemesis gravidarum: Secondary | ICD-10-CM | POA: Insufficient documentation

## 2020-09-06 MED ORDER — LACTATED RINGERS IV BOLUS
1000.0000 mL | INTRAVENOUS | Status: DC
  Administered 2020-09-06: 1000 mL via INTRAVENOUS

## 2020-09-08 ENCOUNTER — Inpatient Hospital Stay (HOSPITAL_COMMUNITY): Admission: RE | Admit: 2020-09-08 | Payer: Medicaid Other | Source: Ambulatory Visit

## 2020-09-11 ENCOUNTER — Other Ambulatory Visit: Payer: Self-pay | Admitting: Certified Nurse Midwife

## 2020-09-11 NOTE — Progress Notes (Signed)
Pt feeling lightheadeded and has documented iron deficiency anemia. Has hyperemesis gravidarum and does not tolerate oral intake of food or pills. Increasing fluid boluses to 4x/wk, ordering venofer 300mg  x1 dose and adding ferritin level at next infusion appointment.  , CNM, MSN, IBCLC Certified Nurse Midwife, [facility]

## 2020-09-12 ENCOUNTER — Other Ambulatory Visit: Payer: Self-pay | Admitting: Lactation Services

## 2020-09-12 ENCOUNTER — Ambulatory Visit (HOSPITAL_COMMUNITY): Payer: Medicaid Other

## 2020-09-12 DIAGNOSIS — O0990 Supervision of high risk pregnancy, unspecified, unspecified trimester: Secondary | ICD-10-CM

## 2020-09-12 NOTE — Progress Notes (Signed)
Ferritin level ordered to be drawn prior to Venofer infusion at request of Edd Arbour, CNM. Patient aware and plans to come on 2/24 at 8 am.

## 2020-09-14 ENCOUNTER — Other Ambulatory Visit: Payer: Self-pay

## 2020-09-14 ENCOUNTER — Other Ambulatory Visit: Payer: Medicaid Other

## 2020-09-14 DIAGNOSIS — F319 Bipolar disorder, unspecified: Secondary | ICD-10-CM | POA: Diagnosis not present

## 2020-09-14 DIAGNOSIS — O0990 Supervision of high risk pregnancy, unspecified, unspecified trimester: Secondary | ICD-10-CM

## 2020-09-15 ENCOUNTER — Encounter (HOSPITAL_COMMUNITY)
Admission: RE | Admit: 2020-09-15 | Discharge: 2020-09-15 | Disposition: A | Discharge status: Home / Self Care | Payer: Medicaid Other | Source: Ambulatory Visit | Attending: Family Medicine | Admitting: Family Medicine

## 2020-09-15 DIAGNOSIS — O210 Mild hyperemesis gravidarum: Secondary | ICD-10-CM | POA: Diagnosis not present

## 2020-09-15 LAB — FERRITIN
Ferritin: 7 ng/mL — ABNORMAL LOW (ref 15–150)
Ferritin: 5 ng/mL — ABNORMAL LOW (ref 11–307)

## 2020-09-15 MED ORDER — SODIUM CHLORIDE 0.9 % IV SOLN
300.0000 mg | Freq: Once | INTRAVENOUS | Status: AC
  Administered 2020-09-15: 300 mg via INTRAVENOUS
  Filled 2020-09-15: qty 15

## 2020-09-15 NOTE — Progress Notes (Signed)
Pt here for IV Venofer infusion. States she does not have the time to also receive the MVI infusion so this was not given today.

## 2020-09-16 ENCOUNTER — Other Ambulatory Visit: Payer: Self-pay

## 2020-09-16 ENCOUNTER — Inpatient Hospital Stay (HOSPITAL_COMMUNITY)
Admission: AD | Admit: 2020-09-16 | Discharge: 2020-09-17 | Disposition: A | Discharge status: Home / Self Care | Payer: Medicaid Other | Attending: Obstetrics and Gynecology | Admitting: Obstetrics and Gynecology

## 2020-09-16 ENCOUNTER — Encounter (HOSPITAL_COMMUNITY): Payer: Self-pay | Admitting: Obstetrics and Gynecology

## 2020-09-16 DIAGNOSIS — O99353 Diseases of the nervous system complicating pregnancy, third trimester: Secondary | ICD-10-CM | POA: Insufficient documentation

## 2020-09-16 DIAGNOSIS — Z881 Allergy status to other antibiotic agents status: Secondary | ICD-10-CM | POA: Insufficient documentation

## 2020-09-16 DIAGNOSIS — K219 Gastro-esophageal reflux disease without esophagitis: Secondary | ICD-10-CM | POA: Insufficient documentation

## 2020-09-16 DIAGNOSIS — O99343 Other mental disorders complicating pregnancy, third trimester: Secondary | ICD-10-CM | POA: Diagnosis not present

## 2020-09-16 DIAGNOSIS — Z3A32 32 weeks gestation of pregnancy: Secondary | ICD-10-CM | POA: Diagnosis not present

## 2020-09-16 DIAGNOSIS — R109 Unspecified abdominal pain: Secondary | ICD-10-CM | POA: Diagnosis not present

## 2020-09-16 DIAGNOSIS — F319 Bipolar disorder, unspecified: Secondary | ICD-10-CM | POA: Diagnosis not present

## 2020-09-16 DIAGNOSIS — G40909 Epilepsy, unspecified, not intractable, without status epilepticus: Secondary | ICD-10-CM | POA: Insufficient documentation

## 2020-09-16 DIAGNOSIS — R569 Unspecified convulsions: Secondary | ICD-10-CM

## 2020-09-16 DIAGNOSIS — O99613 Diseases of the digestive system complicating pregnancy, third trimester: Secondary | ICD-10-CM | POA: Diagnosis not present

## 2020-09-16 DIAGNOSIS — O0990 Supervision of high risk pregnancy, unspecified, unspecified trimester: Secondary | ICD-10-CM

## 2020-09-16 MED ORDER — ALUM & MAG HYDROXIDE-SIMETH 200-200-20 MG/5ML PO SUSP
30.0000 mL | Freq: Once | ORAL | Status: AC
  Administered 2020-09-16: 30 mL via ORAL
  Filled 2020-09-16: qty 30

## 2020-09-16 MED ORDER — LIDOCAINE VISCOUS HCL 2 % MT SOLN
15.0000 mL | Freq: Once | OROMUCOSAL | Status: AC
  Administered 2020-09-16: 15 mL via ORAL
  Filled 2020-09-16: qty 15

## 2020-09-16 NOTE — MAU Provider Note (Signed)
Patient Leah [name] is a 25 y.o. G2P1001 at 8w4dhere with complaints of abdominal pain that started at 6:30 pm. She denies vaginal bleeding, vaginal discharge, LOF, decreased fetal movements, she denies contractions. She has a history of pseudoseizures, HG with IV infusions.    History     CSN: [id number] Arrival date and time: 09/16/20 2143   None     Chief Complaint  Patient presents with  . Abdominal Pain   Abdominal Pain This is a new problem. The current episode started today. The problem occurs constantly. The problem has been unchanged. The pain is at a severity of 8/10. The quality of the pain is sharp. The abdominal pain does not radiate.  The pain is located on the top of her belly button.   OB History    Gravida  2   Para  1   Term  1   Preterm  0   AB  0   Living  1     SAB  0   IAB  0   Ectopic  0   Multiple      Live Births  1           Past Medical History:  Diagnosis Date  . Anemia 2013  . Bipolar 1 disorder ([place]   . Gall stones   . GERD (gastroesophageal reflux disease)   . Seizure ([facility] 2011   pseudoseizures    Past Surgical History:  Procedure Laterality Date  . CHOLECYSTECTOMY N/A 09/28/2014   Procedure: LAPAROSCOPIC CHOLECYSTECTOMY;  Surgeon: ARalene Ok MD;  [facility] [facility]  Service: General;  Laterality: N/A;  . DIRECT LARYNGOSCOPY N/A 04/24/2014   Procedure: DIRECT LARYNGOSCOPY;  Surgeon: SAscencion Dike MD;  [place] [facility]  Service: ENT;  Laterality: N/A;  . FOREIGN BODY REMOVAL ESOPHAGEAL N/A 04/24/2014   Procedure: REMOVAL FOREIGN BODY ESOPHAGEAL;  Surgeon: SAscencion Dike MD;  [facility];  Service: ENT;  Laterality: N/A;  . WISDOM TOOTH EXTRACTION  2013    Family History  Problem Relation Age of Onset  . Arthritis Mother   . Bronchitis Mother   . Asthma Mother   . Hearing loss Paternal Grandfather   . Diabetes Neg Hx   . Stomach cancer Neg Hx   . Colon cancer Neg Hx     Social History   Tobacco Use  .  Smoking status: Never Smoker  . Smokeless tobacco: Never Used  . Tobacco comment: marjiuna  Vaping Use  . Vaping Use: Never used  Substance Use Topics  . Alcohol use: Not Currently    Alcohol/week: 6.0 standard drinks    Types: 6 Cans of beer per week  . Drug use: Yes    Types: Marijuana    Comment: last used before pregnancy    Allergies:  Allergies  Allergen Reactions  . Bactrim [Sulfamethoxazole-Trimethoprim] Rash    Medications Prior to Admission  Medication Sig Dispense Refill Last Dose  . cyclobenzaprine (FLEXERIL) 10 MG tablet Take 1 tablet (10 mg total) by mouth every 8 (eight) hours as needed for muscle spasms. 30 tablet 0 09/15/2020 at Unknown time  . ondansetron (ZOFRAN ODT) 8 MG disintegrating tablet Take 1 tablet (8 mg total) by mouth every 8 (eight) hours as needed for nausea or vomiting. 40 tablet 0 09/16/2020 at Unknown time  . scopolamine (TRANSDERM-SCOP) 1 MG/3DAYS Place 1 patch onto the skin every 3 (three) days.   Past Week at Unknown time  . Blood Pressure  Monitoring (BLOOD PRESSURE KIT) DEVI 1 Device by Does not apply route as needed. (Patient not taking: Reported on 08/28/2020) 1 each 0     Review of Systems  Eyes: Negative.   Respiratory: Negative.   Gastrointestinal: Positive for abdominal pain.  Genitourinary: Negative.   Musculoskeletal: Negative.   Psychiatric/Behavioral: Negative.    Physical Exam   Last menstrual period 02/01/2020.  Physical Exam HENT:     Head: Normocephalic.  Cardiovascular:     Rate and Rhythm: Normal rate.  Abdominal:     General: Abdomen is flat. Bowel sounds are normal. There is no distension.     Palpations: Abdomen is soft.     Tenderness: There is no abdominal tenderness.     Hernia: No hernia is present.  Neurological:     Mental Status: She is alert.     MAU Course  Procedures  MDM -Given GI cocktail> she reports that her pain is completely gone. Patient is talkative and ambulating without difficulty in  MAU; no concern for surgical abdomen.   NST is reassuring: 135 bpm, mod var, present acel, no decels, no contractions.  Patient reports strong fetal movements while in MAU.  Patient Vitals for the past 24 hrs:  BP Temp Pulse Resp  09/17/20 0018 112/64 -- 90 18  09/16/20 2207 111/66 98.3 F (36.8 C) (!) 120 17     Assessment and Plan   1. Gastroesophageal reflux disease without esophagitis   2. Supervision of high risk pregnancy, antepartum   3. Bipolar 1 disorder ([place])   4. Gastroesophageal reflux disease, unspecified whether esophagitis present   5. Seizure [facility])    Patient stable for discharge; patient plans to keep appt on Monday for IV infusions and has provider visit on 3/9.  Reassured patient of normalcy of occasional abdominal pain during pregnancy, which is MSK in nature. Return to MAU if pain becomes severe, decreased fetal movements, vaginal bleeding, LOF or contractions. Patient agrees with plan of care.    Mervyn Skeeters  09/16/2020, 10:51 PM

## 2020-09-16 NOTE — MAU Note (Signed)
Patient reports sharp upper abdominal pain that started approx 2-3hrs ago 9/10. Patient states she did not take anything for the pain. Patient denies any leaking of fluid or bleeding. Patient reports positive fetal movements. Triage assessment completed. Provider to be notified

## 2020-09-17 NOTE — Discharge Instructions (Signed)
Food Choices for Gastroesophageal Reflux Disease, Adult When you have gastroesophageal reflux disease (GERD), the foods you eat and your eating habits are very important. Choosing the right foods can help ease your discomfort. Think about working with a food expert (dietitian) to help you make good choices. What are tips for following this plan? Reading food labels  Look for foods that are low in saturated fat. Foods that may help with your symptoms include: ? Foods that have less than 5% of daily value (DV) of fat. ? Foods that have 0 grams of trans fat. Cooking  Do not fry your food.  Cook your food by baking, steaming, grilling, or broiling. These are all methods that do not need a lot of fat for cooking.  To add flavor, try to use herbs that are low in spice and acidity. Meal planning  Choose healthy foods that are low in fat, such as: ? Fruits and vegetables. ? Whole grains. ? Low-fat dairy products. ? Lean meats, fish, and poultry.  Eat small meals often instead of eating 3 large meals each day. Eat your meals slowly in a place where you are relaxed. Avoid bending over or lying down until 2-3 hours after eating.  Limit high-fat foods such as fatty meats or fried foods.  Limit your intake of fatty foods, such as oils, butter, and shortening.  Avoid the following as told by your doctor: ? Foods that cause symptoms. These may be different for different people. Keep a food diary to keep track of foods that cause symptoms. ? Alcohol. ? Drinking a lot of liquid with meals. ? Eating meals during the 2-3 hours before bed.   Lifestyle  Stay at a healthy weight. Ask your doctor what weight is healthy for you. If you need to lose weight, work with your doctor to do so safely.  Exercise for at least 30 minutes on 5 or more days each week, or as told by your doctor.  Wear loose-fitting clothes.  Do not smoke or use any products that contain nicotine or tobacco. If you need help  quitting, ask your doctor.  Sleep with the head of your bed higher than your feet. Use a wedge under the mattress or blocks under the bed frame to raise the head of the bed.  Chew sugar-free gum after meals. What foods should eat? Eat a healthy, well-balanced diet of fruits, vegetables, whole grains, low-fat dairy products, lean meats, fish, and poultry. Each person is different. Foods that may cause symptoms in one person may not cause any symptoms in another person. Work with your doctor to find foods that are safe for you. The items listed above may not be a complete list of what you can eat and drink. Contact a food expert for more options.   What foods should I avoid? Limiting some of these foods may help in managing the symptoms of GERD. Everyone is different. Talk with a food expert or your doctor to help you find the exact foods to avoid, if any. Fruits Any fruits prepared with added fat. Any fruits that cause symptoms. For some people, this may include citrus fruits, such as oranges, grapefruit, pineapple, and lemons. Vegetables Deep-fried vegetables. French fries. Any vegetables prepared with added fat. Any vegetables that cause symptoms. For some people, this may include tomatoes and tomato products, chili peppers, onions and garlic, and horseradish. Grains Pastries or quick breads with added fat. Meats and other proteins High-fat meats, such as fatty beef or pork,   hot dogs, ribs, ham, sausage, salami, and bacon. Fried meat or protein, including fried fish and fried chicken. Nuts and nut butters, in large amounts. Dairy Whole milk and chocolate milk. Sour cream. Cream. Ice cream. Cream cheese. Milkshakes. Fats and oils Butter. Margarine. Shortening. Ghee. Beverages Coffee and tea, with or without caffeine. Carbonated beverages. Sodas. Energy drinks. Fruit juice made with acidic fruits, such as orange or grapefruit. Tomato juice. Alcoholic drinks. Sweets and desserts Chocolate and  cocoa. Donuts. Seasonings and condiments Pepper. Peppermint and spearmint. Added salt. Any condiments, herbs, or seasonings that cause symptoms. For some people, this may include curry, hot sauce, or vinegar-based salad dressings. The items listed above may not be a complete list of what you should not eat and drink. Contact a food expert for more options. Questions to ask your doctor Diet and lifestyle changes are often the first steps that are taken to manage symptoms of GERD. If diet and lifestyle changes do not help, talk with your doctor about taking medicines. Where to find more information  International Foundation for Gastrointestinal Disorders: aboutgerd.org Summary  When you have GERD, food and lifestyle choices are very important in easing your symptoms.  Eat small meals often instead of 3 large meals a day. Eat your meals slowly and in a place where you are relaxed.  Avoid bending over or lying down until 2-3 hours after eating.  Limit high-fat foods such as fatty meats or fried foods. This information is not intended to replace advice given to you by your health care provider. Make sure you discuss any questions you have with your health care provider. Document Revised: 01/17/2020 Document Reviewed: 01/17/2020 Elsevier Patient Education  2021 Elsevier Inc.  

## 2020-09-18 ENCOUNTER — Encounter (HOSPITAL_COMMUNITY)
Admission: RE | Admit: 2020-09-18 | Discharge: 2020-09-18 | Disposition: A | Discharge status: Home / Self Care | Payer: Medicaid Other | Source: Ambulatory Visit | Attending: Family Medicine | Admitting: Family Medicine

## 2020-09-18 ENCOUNTER — Other Ambulatory Visit: Payer: Self-pay | Admitting: Lactation Services

## 2020-09-18 ENCOUNTER — Other Ambulatory Visit: Payer: Self-pay

## 2020-09-18 DIAGNOSIS — O210 Mild hyperemesis gravidarum: Secondary | ICD-10-CM | POA: Diagnosis not present

## 2020-09-18 MED ORDER — LACTATED RINGERS IV BOLUS
1000.0000 mL | INTRAVENOUS | Status: DC
  Administered 2020-09-18: 1000 mL via INTRAVENOUS

## 2020-09-18 MED ORDER — PROMETHAZINE HCL 25 MG PO TABS: 25.0000 mg | ORAL_TABLET | Freq: Four times a day (QID) | ORAL | 0 refills | Status: DC | PRN

## 2020-09-19 ENCOUNTER — Encounter (HOSPITAL_COMMUNITY): Payer: Medicaid Other

## 2020-09-21 ENCOUNTER — Other Ambulatory Visit: Payer: Self-pay

## 2020-09-21 ENCOUNTER — Encounter (HOSPITAL_COMMUNITY)
Admission: RE | Admit: 2020-09-21 | Discharge: 2020-09-21 | Disposition: A | Discharge status: Home / Self Care | Payer: Medicaid Other | Source: Ambulatory Visit | Attending: Student | Admitting: Student

## 2020-09-21 DIAGNOSIS — O210 Mild hyperemesis gravidarum: Secondary | ICD-10-CM | POA: Diagnosis not present

## 2020-09-21 MED ORDER — LACTATED RINGERS IV BOLUS
1000.0000 mL | INTRAVENOUS | Status: DC
  Administered 2020-09-21: 1000 mL via INTRAVENOUS

## 2020-09-22 ENCOUNTER — Other Ambulatory Visit (HOSPITAL_COMMUNITY): Payer: Self-pay | Admitting: *Deleted

## 2020-09-22 ENCOUNTER — Encounter (HOSPITAL_COMMUNITY): Payer: Medicaid Other

## 2020-09-23 ENCOUNTER — Other Ambulatory Visit: Payer: Self-pay

## 2020-09-23 ENCOUNTER — Encounter (HOSPITAL_COMMUNITY): Payer: Self-pay | Admitting: Obstetrics and Gynecology

## 2020-09-23 ENCOUNTER — Inpatient Hospital Stay (HOSPITAL_COMMUNITY)
Admission: AD | Admit: 2020-09-23 | Discharge: 2020-09-23 | Disposition: A | Discharge status: Home / Self Care | Payer: Medicaid Other | Attending: Obstetrics and Gynecology | Admitting: Obstetrics and Gynecology

## 2020-09-23 DIAGNOSIS — F319 Bipolar disorder, unspecified: Secondary | ICD-10-CM | POA: Insufficient documentation

## 2020-09-23 DIAGNOSIS — O99343 Other mental disorders complicating pregnancy, third trimester: Secondary | ICD-10-CM | POA: Insufficient documentation

## 2020-09-23 DIAGNOSIS — R102 Pelvic and perineal pain: Secondary | ICD-10-CM | POA: Insufficient documentation

## 2020-09-23 DIAGNOSIS — O26893 Other specified pregnancy related conditions, third trimester: Secondary | ICD-10-CM

## 2020-09-23 DIAGNOSIS — R109 Unspecified abdominal pain: Secondary | ICD-10-CM | POA: Diagnosis not present

## 2020-09-23 DIAGNOSIS — Z3A33 33 weeks gestation of pregnancy: Secondary | ICD-10-CM | POA: Diagnosis not present

## 2020-09-23 DIAGNOSIS — N949 Unspecified condition associated with female genital organs and menstrual cycle: Secondary | ICD-10-CM

## 2020-09-23 MED ORDER — ACETAMINOPHEN 325 MG PO TABS
650.0000 mg | ORAL_TABLET | Freq: Once | ORAL | Status: AC
  Administered 2020-09-23: 650 mg via ORAL
  Filled 2020-09-23: qty 2

## 2020-09-23 MED ORDER — CYCLOBENZAPRINE HCL 5 MG PO TABS
10.0000 mg | ORAL_TABLET | Freq: Once | ORAL | Status: AC
  Administered 2020-09-23: 10 mg via ORAL
  Filled 2020-09-23: qty 2

## 2020-09-23 NOTE — MAU Provider Note (Signed)
Patient Leah [name] is a  25 y.o. G2P1001  at [redacted]w[redacted]d here with complaints of abdominal pain that started at 2100. She says that it is on the sides of her stomach. She denies bleeding, LOF, decreased fetal movements, contractions.  She ate dinner at 7:30 and then threw up; she started having the pain after she threw up.    Patient has had frequent MAU visits for similar complaints in the past.    History     CSN: [id number]  Arrival date and time: 09/23/20 2047   None     Chief Complaint  Patient presents with  . Abdominal Pain  . Back Pain   Abdominal Pain This is a new problem. The current episode started today. The pain is located in the suprapubic region (wrapped to her back). The pain is at a severity of 10/10. The quality of the pain is cramping. Pain radiation: up the sides of her abdomen. Associated symptoms include nausea and vomiting. Pertinent negatives include no constipation, diarrhea, dysuria or fever. Nothing aggravates the pain. The pain is relieved by nothing.    OB History    Gravida  2   Para  1   Term  1   Preterm  0   AB  0   Living  1     SAB  0   IAB  0   Ectopic  0   Multiple      Live Births  1           Past Medical History:  Diagnosis Date  . Anemia 2013  . Bipolar 1 disorder ([facility])   . Gall stones   . GERD (gastroesophageal reflux disease)   . Seizure [facility]) 2011   pseudoseizures    Past Surgical History:  Procedure Laterality Date  . CHOLECYSTECTOMY N/A 09/28/2014   Procedure: LAPAROSCOPIC CHOLECYSTECTOMY;  Surgeon: Axel Filler, MD;  [facility];  Service: General;  Laterality: N/A;  . DIRECT LARYNGOSCOPY N/A 04/24/2014   Procedure: DIRECT LARYNGOSCOPY;  Surgeon: [name], MD;  Location: [facility] OR;  Service: ENT;  Laterality: N/A;  . FOREIGN BODY REMOVAL ESOPHAGEAL N/A 04/24/2014   Procedure: REMOVAL FOREIGN BODY ESOPHAGEAL;  Surgeon: [name], MD;  Location: [facility] OR;  Service: ENT;  Laterality: N/A;  . WISDOM TOOTH  EXTRACTION  2013    Family History  Problem Relation Age of Onset  . Arthritis Mother   . Bronchitis Mother   . Asthma Mother   . Hearing loss Paternal Grandfather   . Diabetes Neg Hx   . Stomach cancer Neg Hx   . Colon cancer Neg Hx     Social History   Tobacco Use  . Smoking status: Never Smoker  . Smokeless tobacco: Never Used  . Tobacco comment: marjiuna  Vaping Use  . Vaping Use: Never used  Substance Use Topics  . Alcohol use: Not Currently    Alcohol/week: 6.0 standard drinks    Types: 6 Cans of beer per week  . Drug use: Yes    Types: Marijuana    Comment: last used before pregnancy    Allergies:  Allergies  Allergen Reactions  . Bactrim [Sulfamethoxazole-Trimethoprim] Rash    Medications Prior to Admission  Medication Sig Dispense Refill Last Dose  . cyclobenzaprine (FLEXERIL) 10 MG tablet Take 1 tablet (10 mg total) by mouth every 8 (eight) hours as needed for muscle spasms. 30 tablet 0 Past Week at Unknown time  . promethazine (PHENERGAN) 25 MG  tablet Take 1 tablet (25 mg total) by mouth every 6 (six) hours as needed for nausea or vomiting. 30 tablet 0   . scopolamine (TRANSDERM-SCOP) 1 MG/3DAYS Place 1 patch onto the skin every 3 (three) days.   09/23/2020 at Unknown time  . Blood Pressure Monitoring (BLOOD PRESSURE KIT) DEVI 1 Device by Does not apply route as needed. (Patient not taking: Reported on 08/28/2020) 1 each 0   . ondansetron (ZOFRAN ODT) 8 MG disintegrating tablet Take 1 tablet (8 mg total) by mouth every 8 (eight) hours as needed for nausea or vomiting. 40 tablet 0     Review of Systems  Constitutional: Negative.  Negative for fever.  HENT: Negative.   Respiratory: Negative.   Cardiovascular: Negative.   Gastrointestinal: Positive for abdominal pain, nausea and vomiting. Negative for constipation and diarrhea.  Endocrine: Negative.   Genitourinary: Negative.  Negative for difficulty urinating and dysuria.  Neurological: Negative.    Psychiatric/Behavioral: Negative.    Physical Exam   Blood pressure 111/64, pulse (!) 114, temperature 98.1 F (36.7 C), temperature source Oral, resp. rate (!) 22, last menstrual period 02/01/2020, SpO2 99 %.  Physical Exam Constitutional:      Appearance: She is well-developed.  Pulmonary:     Effort: Pulmonary effort is normal.  Abdominal:     General: Abdomen is flat. There is no distension or abdominal bruit.  Genitourinary:    Vagina: Normal.     Cervix: Normal.  Neurological:     Mental Status: She is alert.     MAU Course  Procedures  MDM -NST: 135 bpm, mod var, present acel, no decels, no contractions -patient given Tylenol and flexeril, patient reports pain is now 6/10.  -Patient was initially very distressed upon admission to MAU, crying and holding her abdomen. However, after Tylenol and flexeril patient is laughing, eating and moving in bed. She appears calm and in no pain.   Patient was checked and was 0 cm Assessment and Plan   1. Round ligament pain   -Reassured patient of normalcy of pain and pressure at this time; return to MAU when she feels contractions and continue to keep upcoming appts -Reviewed warning signs and other signs of when to come to MAU -Patient agrees with plan of care; requesting discharge.  Mervyn Skeeters Lulabelle Desta 09/23/2020, 9:46 PM

## 2020-09-23 NOTE — MAU Note (Signed)
Pt presents to [facility] c/o lower abdominal pain and back pain beginning " a few minutes ago" per pt.  Pt endorses +FM, denies LOF or vaginal bleeding.

## 2020-09-25 ENCOUNTER — Encounter (HOSPITAL_COMMUNITY)
Admission: RE | Admit: 2020-09-25 | Discharge: 2020-09-25 | Disposition: A | Discharge status: Home / Self Care | Payer: Medicaid Other | Source: Ambulatory Visit | Attending: Family Medicine | Admitting: Family Medicine

## 2020-09-25 ENCOUNTER — Other Ambulatory Visit: Payer: Self-pay

## 2020-09-25 DIAGNOSIS — O210 Mild hyperemesis gravidarum: Secondary | ICD-10-CM | POA: Diagnosis not present

## 2020-09-25 MED ORDER — LACTATED RINGERS IV BOLUS
1000.0000 mL | INTRAVENOUS | Status: DC
  Administered 2020-09-25: 1000 mL via INTRAVENOUS

## 2020-09-27 ENCOUNTER — Ambulatory Visit (INDEPENDENT_AMBULATORY_CARE_PROVIDER_SITE_OTHER): Payer: Medicaid Other | Admitting: Family Medicine

## 2020-09-27 ENCOUNTER — Other Ambulatory Visit (HOSPITAL_COMMUNITY)
Admission: RE | Admit: 2020-09-27 | Discharge: 2020-09-27 | Disposition: A | Discharge status: Home / Self Care | Payer: Medicaid Other | Source: Ambulatory Visit | Attending: Family Medicine | Admitting: Family Medicine

## 2020-09-27 ENCOUNTER — Other Ambulatory Visit: Payer: Self-pay

## 2020-09-27 VITALS — BP 106/59 | HR 90 | Wt 140.3 lb

## 2020-09-27 DIAGNOSIS — F332 Major depressive disorder, recurrent severe without psychotic features: Secondary | ICD-10-CM

## 2020-09-27 DIAGNOSIS — O0990 Supervision of high risk pregnancy, unspecified, unspecified trimester: Secondary | ICD-10-CM

## 2020-09-27 DIAGNOSIS — O210 Mild hyperemesis gravidarum: Secondary | ICD-10-CM

## 2020-09-27 NOTE — Patient Instructions (Signed)
 Contraception Choices Contraception, also called birth control, refers to methods or devices that prevent pregnancy. Hormonal methods Contraceptive implant A contraceptive implant is a thin, plastic tube that contains a hormone that prevents pregnancy. It is different from an intrauterine device (IUD). It is inserted into the upper part of the arm by a health care provider. Implants can be effective for up to 3 years. Progestin-only injections Progestin-only injections are injections of progestin, a synthetic form of the hormone progesterone. They are given every 3 months by a health care provider. Birth control pills Birth control pills are pills that contain hormones that prevent pregnancy. They must be taken once a day, preferably at the same time each day. A prescription is needed to use this method of contraception. Birth control patch The birth control patch contains hormones that prevent pregnancy. It is placed on the skin and must be changed once a week for three weeks and removed on the fourth week. A prescription is needed to use this method of contraception. Vaginal ring A vaginal ring contains hormones that prevent pregnancy. It is placed in the vagina for three weeks and removed on the fourth week. After that, the process is repeated with a new ring. A prescription is needed to use this method of contraception. Emergency contraceptive Emergency contraceptives prevent pregnancy after unprotected sex. They come in pill form and can be taken up to 5 days after sex. They work best the sooner they are taken after having sex. Most emergency contraceptives are available without a prescription. This method should not be used as your only form of birth control.   Barrier methods Female condom A female condom is a thin sheath that is worn over the penis during sex. Condoms keep sperm from going inside a woman's body. They can be used with a sperm-killing substance (spermicide) to increase their  effectiveness. They should be thrown away after one use. Female condom A female condom is a soft, loose-fitting sheath that is put into the vagina before sex. The condom keeps sperm from going inside a woman's body. They should be thrown away after one use. Diaphragm A diaphragm is a soft, dome-shaped barrier. It is inserted into the vagina before sex, along with a spermicide. The diaphragm blocks sperm from entering the uterus, and the spermicide kills sperm. A diaphragm should be left in the vagina for 6-8 hours after sex and removed within 24 hours. A diaphragm is prescribed and fitted by a health care provider. A diaphragm should be replaced every 1-2 years, after giving birth, after gaining more than 15 lb (6.8 kg), and after pelvic surgery. Cervical cap A cervical cap is a round, soft latex or plastic cup that fits over the cervix. It is inserted into the vagina before sex, along with spermicide. It blocks sperm from entering the uterus. The cap should be left in place for 6-8 hours after sex and removed within 48 hours. A cervical cap must be prescribed and fitted by a health care provider. It should be replaced every 2 years. Sponge A sponge is a soft, circular piece of polyurethane foam with spermicide in it. The sponge helps block sperm from entering the uterus, and the spermicide kills sperm. To use it, you make it wet and then insert it into the vagina. It should be inserted before sex, left in for at least 6 hours after sex, and removed and thrown away within 30 hours. Spermicides Spermicides are chemicals that kill or block sperm from entering the   cervix and uterus. They can come as a cream, jelly, suppository, foam, or tablet. A spermicide should be inserted into the vagina with an applicator at least 10-15 minutes before sex to allow time for it to work. The process must be repeated every time you have sex. Spermicides do not require a prescription.   Intrauterine  contraception Intrauterine device (IUD) An IUD is a T-shaped device that is put in a woman's uterus. There are two types:  Hormone IUD.This type contains progestin, a synthetic form of the hormone progesterone. This type can stay in place for 3-5 years.  Copper IUD.This type is wrapped in copper wire. It can stay in place for 10 years. Permanent methods of contraception Female tubal ligation In this method, a woman's fallopian tubes are sealed, tied, or blocked during surgery to prevent eggs from traveling to the uterus. Hysteroscopic sterilization In this method, a small, flexible insert is placed into each fallopian tube. The inserts cause scar tissue to form in the fallopian tubes and block them, so sperm cannot reach an egg. The procedure takes about 3 months to be effective. Another form of birth control must be used during those 3 months. Female sterilization This is a procedure to tie off the tubes that carry sperm (vasectomy). After the procedure, the man can still ejaculate fluid (semen). Another form of birth control must be used for 3 months after the procedure. Natural planning methods Natural family planning In this method, a couple does not have sex on days when the woman could become pregnant. Calendar method In this method, the woman keeps track of the length of each menstrual cycle, identifies the days when pregnancy can happen, and does not have sex on those days. Ovulation method In this method, a couple avoids sex during ovulation. Symptothermal method This method involves not having sex during ovulation. The woman typically checks for ovulation by watching changes in her temperature and in the consistency of cervical mucus. Post-ovulation method In this method, a couple waits to have sex until after ovulation. Where to find more information  Centers for Disease Control and Prevention: www.cdc.gov Summary  Contraception, also called birth control, refers to methods or  devices that prevent pregnancy.  Hormonal methods of contraception include implants, injections, pills, patches, vaginal rings, and emergency contraceptives.  Barrier methods of contraception can include female condoms, female condoms, diaphragms, cervical caps, sponges, and spermicides.  There are two types of IUDs (intrauterine devices). An IUD can be put in a woman's uterus to prevent pregnancy for 3-5 years.  Permanent sterilization can be done through a procedure for males and females. Natural family planning methods involve nothaving sex on days when the woman could become pregnant. This information is not intended to replace advice given to you by your health care provider. Make sure you discuss any questions you have with your health care provider. Document Revised: 12/13/2019 Document Reviewed: 12/13/2019 Elsevier Patient Education  2021 [employer] Inc.   Breastfeeding  Choosing to breastfeed is one of the best decisions you can make for yourself and your baby. A change in hormones during pregnancy causes your breasts to make breast milk in your milk-producing glands. Hormones prevent breast milk from being released before your baby is born. They also prompt milk flow after birth. Once breastfeeding has begun, thoughts of your baby, as well as his or her sucking or crying, can stimulate the release of milk from your milk-producing glands. Benefits of breastfeeding Research shows that breastfeeding offers many health benefits   for infants and mothers. It also offers a cost-free and convenient way to feed your baby. For your baby  Your first milk (colostrum) helps your baby's digestive system to function better.  Special cells in your milk (antibodies) help your baby to fight off infections.  Breastfed babies are less likely to develop asthma, allergies, obesity, or type 2 diabetes. They are also at lower risk for sudden infant death syndrome (SIDS).  Nutrients in breast milk are better  able to meet your baby's needs compared to infant formula.  Breast milk improves your baby's brain development. For you  Breastfeeding helps to create a very special bond between you and your baby.  Breastfeeding is convenient. Breast milk costs nothing and is always available at the correct temperature.  Breastfeeding helps to burn calories. It helps you to lose the weight that you gained during pregnancy.  Breastfeeding makes your uterus return faster to its size before pregnancy. It also slows bleeding (lochia) after you give birth.  Breastfeeding helps to lower your risk of developing type 2 diabetes, osteoporosis, rheumatoid arthritis, cardiovascular disease, and breast, ovarian, uterine, and endometrial cancer later in life. Breastfeeding basics Starting breastfeeding  Find a comfortable place to sit or lie down, with your neck and back well-supported.  Place a pillow or a rolled-up blanket under your baby to bring him or her to the level of your breast (if you are seated). Nursing pillows are specially designed to help support your arms and your baby while you breastfeed.  Make sure that your baby's tummy (abdomen) is facing your abdomen.  Gently massage your breast. With your fingertips, massage from the outer edges of your breast inward toward the nipple. This encourages milk flow. If your milk flows slowly, you may need to continue this action during the feeding.  Support your breast with 4 fingers underneath and your thumb above your nipple (make the letter "C" with your hand). Make sure your fingers are well away from your nipple and your baby's mouth.  Stroke your baby's lips gently with your finger or nipple.  When your baby's mouth is open wide enough, quickly bring your baby to your breast, placing your entire nipple and as much of the areola as possible into your baby's mouth. The areola is the colored area around your nipple. ? More areola should be visible above your  baby's upper lip than below the lower lip. ? Your baby's lips should be opened and extended outward (flanged) to ensure an adequate, comfortable latch. ? Your baby's tongue should be between his or her lower gum and your breast.  Make sure that your baby's mouth is correctly positioned around your nipple (latched). Your baby's lips should create a seal on your breast and be turned out (everted).  It is common for your baby to suck about 2-3 minutes in order to start the flow of breast milk. Latching Teaching your baby how to latch onto your breast properly is very important. An improper latch can cause nipple pain, decreased milk supply, and poor weight gain in your baby. Also, if your baby is not latched onto your nipple properly, he or she may swallow some air during feeding. This can make your baby fussy. Burping your baby when you switch breasts during the feeding can help to get rid of the air. However, teaching your baby to latch on properly is still the best way to prevent fussiness from swallowing air while breastfeeding. Signs that your baby has successfully latched onto   your nipple  Silent tugging or silent sucking, without causing you pain. Infant's lips should be extended outward (flanged).  Swallowing heard between every 3-4 sucks once your milk has started to flow (after your let-down milk reflex occurs).  Muscle movement above and in front of his or her ears while sucking. Signs that your baby has not successfully latched onto your nipple  Sucking sounds or smacking sounds from your baby while breastfeeding.  Nipple pain. If you think your baby has not latched on correctly, slip your finger into the corner of your baby's mouth to break the suction and place it between your baby's gums. Attempt to start breastfeeding again. Signs of successful breastfeeding Signs from your baby  Your baby will gradually decrease the number of sucks or will completely stop sucking.  Your baby  will fall asleep.  Your baby's body will relax.  Your baby will retain a small amount of milk in his or her mouth.  Your baby will let go of your breast by himself or herself. Signs from you  Breasts that have increased in firmness, weight, and size 1-3 hours after feeding.  Breasts that are softer immediately after breastfeeding.  Increased milk volume, as well as a change in milk consistency and color by the fifth day of breastfeeding.  Nipples that are not sore, cracked, or bleeding. Signs that your baby is getting enough milk  Wetting at least 1-2 diapers during the first 24 hours after birth.  Wetting at least 5-6 diapers every 24 hours for the first week after birth. The urine should be clear or pale yellow by the age of 5 days.  Wetting 6-8 diapers every 24 hours as your baby continues to grow and develop.  At least 3 stools in a 24-hour period by the age of 5 days. The stool should be soft and yellow.  At least 3 stools in a 24-hour period by the age of 7 days. The stool should be seedy and yellow.  No loss of weight greater than 10% of birth weight during the first 3 days of life.  Average weight gain of 4-7 oz (113-198 g) per week after the age of 4 days.  Consistent daily weight gain by the age of 5 days, without weight loss after the age of 2 weeks. After a feeding, your baby may spit up a small amount of milk. This is normal. Breastfeeding frequency and duration Frequent feeding will help you make more milk and can prevent sore nipples and extremely full breasts (breast engorgement). Breastfeed when you feel the need to reduce the fullness of your breasts or when your baby shows signs of hunger. This is called "breastfeeding on demand." Signs that your baby is hungry include:  Increased alertness, activity, or restlessness.  Movement of the head from side to side.  Opening of the mouth when the corner of the mouth or cheek is stroked (rooting).  Increased  sucking sounds, smacking lips, cooing, sighing, or squeaking.  Hand-to-mouth movements and sucking on fingers or hands.  Fussing or crying. Avoid introducing a pacifier to your baby in the first 4-6 weeks after your baby is born. After this time, you may choose to use a pacifier. Research has shown that pacifier use during the first year of a baby's life decreases the risk of sudden infant death syndrome (SIDS). Allow your baby to feed on each breast as long as he or she wants. When your baby unlatches or falls asleep while feeding from the   first breast, offer the second breast. Because newborns are often sleepy in the first few weeks of life, you may need to awaken your baby to get him or her to feed. Breastfeeding times will vary from baby to baby. However, the following rules can serve as a guide to help you make sure that your baby is properly fed:  Newborns (babies 4 weeks of age or younger) may breastfeed every 1-3 hours.  Newborns should not go without breastfeeding for longer than 3 hours during the day or 5 hours during the night.  You should breastfeed your baby a minimum of 8 times in a 24-hour period. Breast milk pumping Pumping and storing breast milk allows you to make sure that your baby is exclusively fed your breast milk, even at times when you are unable to breastfeed. This is especially important if you go back to work while you are still breastfeeding, or if you are not able to be present during feedings. Your lactation consultant can help you find a method of pumping that works best for you and give you guidelines about how long it is safe to store breast milk.      Caring for your breasts while you breastfeed Nipples can become dry, cracked, and sore while breastfeeding. The following recommendations can help keep your breasts moisturized and healthy:  Avoid using soap on your nipples.  Wear a supportive bra designed especially for nursing. Avoid wearing underwire-style  bras or extremely tight bras (sports bras).  Air-dry your nipples for 3-4 minutes after each feeding.  Use only cotton bra pads to absorb leaked breast milk. Leaking of breast milk between feedings is normal.  Use lanolin on your nipples after breastfeeding. Lanolin helps to maintain your skin's normal moisture barrier. Pure lanolin is not harmful (not toxic) to your baby. You may also hand express a few drops of breast milk and gently massage that milk into your nipples and allow the milk to air-dry. In the first few weeks after giving birth, some women experience breast engorgement. Engorgement can make your breasts feel heavy, warm, and tender to the touch. Engorgement peaks within 3-5 days after you give birth. The following recommendations can help to ease engorgement:  Completely empty your breasts while breastfeeding or pumping. You may want to start by applying warm, moist heat (in the shower or with warm, water-soaked hand towels) just before feeding or pumping. This increases circulation and helps the milk flow. If your baby does not completely empty your breasts while breastfeeding, pump any extra milk after he or she is finished.  Apply ice packs to your breasts immediately after breastfeeding or pumping, unless this is too uncomfortable for you. To do this: ? Put ice in a plastic bag. ? Place a towel between your skin and the bag. ? Leave the ice on for 20 minutes, 2-3 times a day.  Make sure that your baby is latched on and positioned properly while breastfeeding. If engorgement persists after 48 hours of following these recommendations, contact your health care provider or a lactation consultant. Overall health care recommendations while breastfeeding  Eat 3 healthy meals and 3 snacks every day. Well-nourished mothers who are breastfeeding need an additional 450-500 calories a day. You can meet this requirement by increasing the amount of a balanced diet that you eat.  Drink  enough water to keep your urine pale yellow or clear.  Rest often, relax, and continue to take your prenatal vitamins to prevent fatigue, stress, and low   vitamin and mineral levels in your body (nutrient deficiencies).  Do not use any products that contain nicotine or tobacco, such as cigarettes and e-cigarettes. Your baby may be harmed by chemicals from cigarettes that pass into breast milk and exposure to secondhand smoke. If you need help quitting, ask your health care provider.  Avoid alcohol.  Do not use illegal drugs or marijuana.  Talk with your health care provider before taking any medicines. These include over-the-counter and prescription medicines as well as vitamins and herbal supplements. Some medicines that may be harmful to your baby can pass through breast milk.  It is possible to become pregnant while breastfeeding. If birth control is desired, ask your health care provider about options that will be safe while breastfeeding your baby. Where to find more information: [employer]: www.llli.org Contact a health care provider if:  You feel like you want to stop breastfeeding or have become frustrated with breastfeeding.  Your nipples are cracked or bleeding.  Your breasts are red, tender, or warm.  You have: ? Painful breasts or nipples. ? A swollen area on either breast. ? A fever or chills. ? Nausea or vomiting. ? Drainage other than breast milk from your nipples.  Your breasts do not become full before feedings by the fifth day after you give birth.  You feel sad and depressed.  Your baby is: ? Too sleepy to eat well. ? Having trouble sleeping. ? More than 1 week old and wetting fewer than 6 diapers in a 24-hour period. ? Not gaining weight by 5 days of age.  Your baby has fewer than 3 stools in a 24-hour period.  Your baby's skin or the white parts of his or her eyes become yellow. Get help right away if:  Your baby is overly tired  (lethargic) and does not want to wake up and feed.  Your baby develops an unexplained fever. Summary  Breastfeeding offers many health benefits for infant and mothers.  Try to breastfeed your infant when he or she shows early signs of hunger.  Gently tickle or stroke your baby's lips with your finger or nipple to allow the baby to open his or her mouth. Bring the baby to your breast. Make sure that much of the areola is in your baby's mouth. Offer one side and burp the baby before you offer the other side.  Talk with your health care provider or lactation consultant if you have questions or you face problems as you breastfeed. This information is not intended to replace advice given to you by your health care provider. Make sure you discuss any questions you have with your health care provider. Document Revised: 10/02/2017 Document Reviewed: 08/09/2016 Elsevier Patient Education  2021 Elsevier Inc.  

## 2020-09-27 NOTE — Progress Notes (Signed)
   Subjective:  Leah [name] is a 25 y.o. G2P1001 at [redacted]w[redacted]d being seen today for ongoing prenatal care.  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  Patient reports no complaints.  Contractions: Not present. Vag. Bleeding: None.  Movement: Present. Denies leaking of fluid.   The following portions of the patient's history were reviewed and updated as appropriate: allergies, current medications, past family history, past medical history, past social history, past surgical history and problem list. Problem list updated.  Objective:   Vitals:   09/27/20 0821  BP: (!) 106/59  Pulse: 90  Weight: 140 lb 4.8 oz (63.6 kg)    Fetal Status: Fetal Heart Rate (bpm): 136   Movement: Present     General:  Alert, oriented and cooperative. Patient is in no acute distress.  Skin: Skin is warm and dry. No rash noted.   Cardiovascular: Normal heart rate noted  Respiratory: Normal respiratory effort, no problems with respiration noted  Abdomen: Soft, gravid, appropriate for gestational age. Pain/Pressure: Present     Pelvic: Vag. Bleeding: None     Cervical exam deferred        Extremities: Normal range of motion.  Edema: None  Mental Status: Normal mood and affect. Normal behavior. Normal judgment and thought content.   Urinalysis:      Assessment and Plan:  Pregnancy: G2P1001 at [redacted]w[redacted]d  1. Supervision of high risk pregnancy, antepartum BP and FHR normal Would like testing for STI/vaginitis, self swab sent, will notify by mychart of results Discussed contraception, still considering BTL but may decide on Nexplanon  2. Severe recurrent major  depression without psychotic features (HCC) Reports mood is good In counseling every other week  3. Hyperemesis gravidarum Symptoms controlled  Preterm labor symptoms and general obstetric precautions including but not limited to vaginal bleeding, contractions, leaking of fluid and fetal movement were reviewed in detail with the patient. Please refer to After Visit Summary for other counseling recommendations.  Return in 2 weeks (on 10/11/2020).   Venora Maples, MD

## 2020-09-28 ENCOUNTER — Encounter (HOSPITAL_COMMUNITY): Payer: Medicaid Other

## 2020-09-28 DIAGNOSIS — F319 Bipolar disorder, unspecified: Secondary | ICD-10-CM | POA: Diagnosis not present

## 2020-09-28 LAB — CERVICOVAGINAL ANCILLARY ONLY
Bacterial Vaginitis (gardnerella): NEGATIVE
Candida Glabrata: NEGATIVE
Candida Vaginitis: POSITIVE — AB
Chlamydia: NEGATIVE
Comment: NEGATIVE
Comment: NEGATIVE
Comment: NEGATIVE
Comment: NEGATIVE
Comment: NEGATIVE
Comment: NORMAL
Neisseria Gonorrhea: NEGATIVE
Trichomonas: NEGATIVE

## 2020-09-28 MED ORDER — FLUCONAZOLE 150 MG PO TABS: 150.0000 mg | ORAL_TABLET | Freq: Once | ORAL | 0 refills | Status: AC

## 2020-09-28 NOTE — Addendum Note (Signed)
Addended by: Merian Capron on: 09/28/2020 01:30 PM   Modules accepted: Orders

## 2020-09-29 ENCOUNTER — Ambulatory Visit: Payer: Medicaid Other | Admitting: *Deleted

## 2020-09-29 ENCOUNTER — Encounter: Payer: Self-pay | Admitting: *Deleted

## 2020-09-29 ENCOUNTER — Other Ambulatory Visit: Payer: Self-pay

## 2020-09-29 ENCOUNTER — Ambulatory Visit: Payer: Medicaid Other | Attending: Obstetrics

## 2020-09-29 DIAGNOSIS — Z3A34 34 weeks gestation of pregnancy: Secondary | ICD-10-CM | POA: Diagnosis not present

## 2020-09-29 DIAGNOSIS — O0990 Supervision of high risk pregnancy, unspecified, unspecified trimester: Secondary | ICD-10-CM | POA: Diagnosis not present

## 2020-09-29 DIAGNOSIS — W19XXXA Unspecified fall, initial encounter: Secondary | ICD-10-CM | POA: Diagnosis not present

## 2020-09-29 DIAGNOSIS — O9A213 Injury, poisoning and certain other consequences of external causes complicating pregnancy, third trimester: Secondary | ICD-10-CM

## 2020-09-29 DIAGNOSIS — O99353 Diseases of the nervous system complicating pregnancy, third trimester: Secondary | ICD-10-CM

## 2020-09-29 DIAGNOSIS — G40909 Epilepsy, unspecified, not intractable, without status epilepticus: Secondary | ICD-10-CM | POA: Diagnosis not present

## 2020-09-29 DIAGNOSIS — O99343 Other mental disorders complicating pregnancy, third trimester: Secondary | ICD-10-CM | POA: Diagnosis not present

## 2020-09-29 DIAGNOSIS — F419 Anxiety disorder, unspecified: Secondary | ICD-10-CM | POA: Diagnosis not present

## 2020-09-29 DIAGNOSIS — F319 Bipolar disorder, unspecified: Secondary | ICD-10-CM | POA: Diagnosis not present

## 2020-09-29 DIAGNOSIS — R569 Unspecified convulsions: Secondary | ICD-10-CM

## 2020-09-29 DIAGNOSIS — O4693 Antepartum hemorrhage, unspecified, third trimester: Secondary | ICD-10-CM | POA: Diagnosis not present

## 2020-10-02 ENCOUNTER — Other Ambulatory Visit: Payer: Self-pay | Admitting: *Deleted

## 2020-10-02 ENCOUNTER — Other Ambulatory Visit: Payer: Self-pay

## 2020-10-02 DIAGNOSIS — B379 Candidiasis, unspecified: Secondary | ICD-10-CM

## 2020-10-02 DIAGNOSIS — G40909 Epilepsy, unspecified, not intractable, without status epilepticus: Secondary | ICD-10-CM

## 2020-10-02 MED ORDER — TERCONAZOLE 0.4 % VA CREA: 1.0000 | TOPICAL_CREAM | Freq: Every day | VAGINAL | 0 refills | Status: DC

## 2020-10-03 ENCOUNTER — Inpatient Hospital Stay (HOSPITAL_COMMUNITY)
Admission: AD | Admit: 2020-10-03 | Discharge: 2020-10-03 | Discharge status: Against Medical Advice | Payer: Medicaid Other | Attending: Obstetrics and Gynecology | Admitting: Obstetrics and Gynecology

## 2020-10-03 ENCOUNTER — Other Ambulatory Visit: Payer: Self-pay

## 2020-10-03 DIAGNOSIS — R109 Unspecified abdominal pain: Secondary | ICD-10-CM | POA: Insufficient documentation

## 2020-10-03 DIAGNOSIS — O99323 Drug use complicating pregnancy, third trimester: Secondary | ICD-10-CM | POA: Insufficient documentation

## 2020-10-03 DIAGNOSIS — O26893 Other specified pregnancy related conditions, third trimester: Secondary | ICD-10-CM | POA: Insufficient documentation

## 2020-10-03 DIAGNOSIS — Z3A35 35 weeks gestation of pregnancy: Secondary | ICD-10-CM | POA: Insufficient documentation

## 2020-10-03 DIAGNOSIS — Z881 Allergy status to other antibiotic agents status: Secondary | ICD-10-CM | POA: Diagnosis not present

## 2020-10-03 DIAGNOSIS — Z5329 Procedure and treatment not carried out because of patient's decision for other reasons: Secondary | ICD-10-CM

## 2020-10-03 DIAGNOSIS — O26899 Other specified pregnancy related conditions, unspecified trimester: Secondary | ICD-10-CM

## 2020-10-03 DIAGNOSIS — Z5321 Procedure and treatment not carried out due to patient leaving prior to being seen by health care provider: Secondary | ICD-10-CM | POA: Diagnosis not present

## 2020-10-03 NOTE — MAU Note (Signed)
Pt ref all fetal monitoring at this time, RN informed pt of the risks. Pt ref.

## 2020-10-03 NOTE — MAU Provider Note (Signed)
History     CSN: [id number]  Arrival date and time: 10/03/20 1931   Event Date/Time   First Provider Initiated Contact with Patient 10/03/20 2303      Chief Complaint  Patient presents with  . Abdominal Pain   HPI Leah [name] is a 25 y.o. G2P1001 at 71w0dwho presents with abdominal pain. Reports pain every 6-8 minutes and every 2 hours since Saturday. No lof or vaginal bleeding. Will not contribute any further to HPI.   OB History    Gravida  2   Para  1   Term  1   Preterm  0   AB  0   Living  1     SAB  0   IAB  0   Ectopic  0   Multiple      Live Births  1           Past Medical History:  Diagnosis Date  . Anemia 2013  . Bipolar 1 disorder ([facility]   . Gall stones   . GERD (gastroesophageal reflux disease)   . 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 CHOLECYSTECTOMY N/A 09/28/2014   Procedure: LAPAROSCOPIC CHOLECYSTECTOMY;  Surgeon: ARalene Ok MD;  [place]  Service: General;  Laterality: N/A;  . DIRECT LARYNGOSCOPY N/A 04/24/2014   Procedure: DIRECT LARYNGOSCOPY;  Surgeon: SAscencion Dike MD;  [place]  Service: ENT;  Laterality: N/A;  . FOREIGN BODY REMOVAL ESOPHAGEAL N/A 04/24/2014   Procedure: REMOVAL FOREIGN BODY ESOPHAGEAL;  Surgeon: SAscencion Dike MD;  [facility]  Service: ENT;  Laterality: N/A;  . WISDOM TOOTH EXTRACTION  2013    Family History  Problem Relation Age of Onset  . Arthritis Mother   . Bronchitis Mother   . Asthma Mother   . Hearing loss Paternal Grandfather   . Diabetes Neg Hx   . Stomach cancer Neg Hx   . Colon cancer Neg Hx     Social History   Tobacco Use  . Smoking status: Never Smoker  . Smokeless tobacco: Never Used  . Tobacco comment: marjiuna  Vaping Use  . Vaping Use: Never used  Substance Use Topics  . Alcohol use: Not Currently    Alcohol/week: 6.0 standard drinks    Types: 6 Cans of beer per week  . Drug use: Yes    Types:  Marijuana    Comment: last used before pregnancy    Allergies:  Allergies  Allergen Reactions  . Bactrim [Sulfamethoxazole-Trimethoprim] Rash    Medications Prior to Admission  Medication Sig Dispense Refill Last Dose  . cyclobenzaprine (FLEXERIL) 10 MG tablet Take 1 tablet (10 mg total) by mouth every 8 (eight) hours as needed for muscle spasms. 30 tablet 0 Past Week at Unknown time  . promethazine (PHENERGAN) 25 MG tablet Take 1 tablet (25 mg total) by mouth every 6 (six) hours as needed for nausea or vomiting. 30 tablet 0 10/02/2020 at Unknown time  . Blood Pressure Monitoring (BLOOD PRESSURE KIT) DEVI 1 Device by Does not apply route as needed. 1 each 0   . ondansetron (ZOFRAN ODT) 8 MG disintegrating tablet Take 1 tablet (8 mg total) by mouth every 8 (eight) hours as needed for nausea or vomiting. 40 tablet 0   . scopolamine (TRANSDERM-SCOP) 1 MG/3DAYS Place 1 patch onto the skin every 3 (three) days.     .Marland Kitchenterconazole (TERAZOL 7) 0.4 %  vaginal cream Place 1 applicator vaginally at bedtime. 45 g 0     Review of Systems  Gastrointestinal: Positive for abdominal pain.  Genitourinary: Negative for vaginal bleeding and vaginal discharge.   Physical Exam   Blood pressure 108/66, pulse 96, temperature 97.9 F (36.6 C), temperature source Oral, resp. rate 17, height 5' (1.524 m), weight 64.6 kg, last menstrual period 02/01/2020, SpO2 99 %.  Physical Exam Vitals and nursing note reviewed.  Constitutional:      General: She is not in acute distress.    Appearance: She is well-developed.  HENT:     Head: Normocephalic and atraumatic.  Pulmonary:     Effort: Pulmonary effort is normal. No respiratory distress.  Neurological:     Mental Status: She is alert.     MAU Course  Procedures No results found for this or any previous visit (from the past 24 hour(s)).  MDM Patient presents with abdominal pain wanting her cervix checked. Cervix checked by RN & is closed. Patient refuses  fetal monitoring or evaluation of her pain. Will not discuss with me further what her symptoms are. States she will f/u with her ob next Tuesday.    Assessment and Plan   1. Left against medical advice   2. Abdominal pain affecting pregnancy   3. [redacted] weeks gestation of pregnancy    -discussed reasons to return to [place] 10/03/2020, 11:06 PM

## 2020-10-03 NOTE — MAU Note (Signed)
..  Leah [name] is a 25 y.o. at [redacted]w[redacted]d here in MAU reporting: pelvic pain that comes and goes since S aturday. Denies vaginal bleeding or LOF. Reports that she lost her mucous plug today. +FM Pain score: 10/10 Vitals:   10/03/20 2018  BP: 108/66  Pulse: 96  Resp: 17  Temp: 97.9 F (36.6 C)  SpO2: 99%     FHT: 135

## 2020-10-03 NOTE — MAU Note (Signed)
Per MAU CNM, RN ok to perform cervical check per pt request.  Pt requesting cervical check only

## 2020-10-09 ENCOUNTER — Ambulatory Visit: Payer: Medicaid Other | Attending: Certified Nurse Midwife | Admitting: Physical Therapy

## 2020-10-10 ENCOUNTER — Encounter: Payer: Self-pay | Admitting: Obstetrics and Gynecology

## 2020-10-10 ENCOUNTER — Other Ambulatory Visit: Payer: Self-pay

## 2020-10-10 ENCOUNTER — Ambulatory Visit (INDEPENDENT_AMBULATORY_CARE_PROVIDER_SITE_OTHER): Payer: Medicaid Other | Admitting: Obstetrics and Gynecology

## 2020-10-10 VITALS — BP 98/64 | HR 99 | Wt 141.0 lb

## 2020-10-10 DIAGNOSIS — F332 Major depressive disorder, recurrent severe without psychotic features: Secondary | ICD-10-CM

## 2020-10-10 DIAGNOSIS — H40033 Anatomical narrow angle, bilateral: Secondary | ICD-10-CM | POA: Diagnosis not present

## 2020-10-10 DIAGNOSIS — Z3A36 36 weeks gestation of pregnancy: Secondary | ICD-10-CM

## 2020-10-10 DIAGNOSIS — O0990 Supervision of high risk pregnancy, unspecified, unspecified trimester: Secondary | ICD-10-CM

## 2020-10-10 DIAGNOSIS — G44209 Tension-type headache, unspecified, not intractable: Secondary | ICD-10-CM | POA: Diagnosis not present

## 2020-10-10 NOTE — Progress Notes (Signed)
   PRENATAL VISIT NOTE  Subjective:  Leah [name] is a 25 y.o. G2P1001 at [redacted]w[redacted]d being seen today for ongoing prenatal care.  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  Patient reports pressure.  Contractions: Not present. Vag. Bleeding: None.  Movement: Present. Denies leaking of fluid. Seen in MAU for pressure and pain and cervix was closed. Still feeling the pressure, it comes and goes. Not sure what contractions feel like.   Plans to see Toy Cookey NP to restart zyprexa after delivery. Says mood is not great but feels well supported.Seeing a counselor every other week.   The following portions of the patient's history were reviewed and updated as appropriate: allergies, current medications, past family history, past medical history, past social history, past surgical history and problem list.   Objective:   Vitals:   10/10/20 0825  BP: 98/64  Pulse: 99  Weight: 141 lb (64 kg)    Fetal Status: Fetal Heart Rate (bpm): 139 Fundal Height: 36 cm Movement: Present  Presentation: Vertex  General:  Alert, oriented and cooperative. Patient is in no acute distress.  Skin: Skin is warm and dry. No rash noted.   Cardiovascular: Normal heart rate noted  Respiratory: Normal respiratory effort, no problems with respiration noted  Abdomen: Soft, gravid, appropriate for gestational age.  Pain/Pressure: Present     Pelvic: Cervical exam performed in the presence of a chaperone Dilation: 2 Effacement (%): 50 Station: -2  Extremities: Normal range of motion.  Edema: None  Mental Status: Normal mood and affect. Normal behavior. Normal judgment  and thought content.   Assessment and Plan:  Pregnancy: G2P1001 at [redacted]w[redacted]d 1. [redacted] weeks gestation of pregnancy GBS swabs obtained Labor precautions discussed   2. Supervision of high risk pregnancy, antepartum   3. Severe recurrent major depression without psychotic features (HCC) -discussed mood today. Feeling supported but desires restarting medications after delivery. Has plan for follow up with NP. Continuing to see her counselor.   Term labor symptoms and general obstetric precautions including but not limited to vaginal bleeding, contractions, leaking of fluid and fetal movement were reviewed in detail with the patient. Please refer to After Visit Summary for other counseling recommendations.   Return in about 1 week (around 10/17/2020) for OB .  Future Appointments  Date Time Provider Department Center  10/18/2020  8:30 AM Shanna Cisco, NP [facility] None  10/19/2020  7:15 AM [facility] NURSE [facility]  10/19/2020  7:30 AM [facility] US3 [facility]    Gita Kudo, MD

## 2020-10-10 NOTE — Patient Instructions (Signed)

## 2020-10-12 ENCOUNTER — Inpatient Hospital Stay (HOSPITAL_COMMUNITY)
Admission: AD | Admit: 2020-10-12 | Discharge: 2020-10-13 | Disposition: A | Discharge status: Home / Self Care | Payer: Medicaid Other | Attending: Obstetrics & Gynecology | Admitting: Obstetrics & Gynecology

## 2020-10-12 ENCOUNTER — Encounter (HOSPITAL_COMMUNITY): Payer: Self-pay | Admitting: Obstetrics & Gynecology

## 2020-10-12 ENCOUNTER — Other Ambulatory Visit: Payer: Self-pay

## 2020-10-12 DIAGNOSIS — O4703 False labor before 37 completed weeks of gestation, third trimester: Secondary | ICD-10-CM | POA: Diagnosis not present

## 2020-10-12 DIAGNOSIS — O479 False labor, unspecified: Secondary | ICD-10-CM

## 2020-10-12 DIAGNOSIS — F319 Bipolar disorder, unspecified: Secondary | ICD-10-CM | POA: Diagnosis not present

## 2020-10-12 DIAGNOSIS — Z3A36 36 weeks gestation of pregnancy: Secondary | ICD-10-CM | POA: Insufficient documentation

## 2020-10-12 MED ORDER — CALCIUM CARBONATE ANTACID 500 MG PO CHEW
2.0000 | CHEWABLE_TABLET | Freq: Once | ORAL | Status: AC
  Administered 2020-10-12: 400 mg via ORAL
  Filled 2020-10-12: qty 2

## 2020-10-12 NOTE — MAU Note (Signed)
PT SAYS UC STRONG SINCE 6PM [facility]  WITH CLINIC.- VE ON Tuesday -  2 CM.   DENIES HSV AND MRSA.   GBS- COLLECTED ON Tuesday

## 2020-10-13 DIAGNOSIS — Z3A36 36 weeks gestation of pregnancy: Secondary | ICD-10-CM | POA: Diagnosis not present

## 2020-10-13 DIAGNOSIS — O4703 False labor before 37 completed weeks of gestation, third trimester: Secondary | ICD-10-CM | POA: Diagnosis not present

## 2020-10-13 NOTE — Progress Notes (Addendum)
Ms. ORVILLE WIDMANN is a G2P1001 at [redacted]w[redacted]d seen in MAU for labor. RN labor check, not seen by provider. SVE by RN Dilation: 4 Effacement (%): 60 Cervical Position: Middle Station: -2 Presentation: Vertex Exam by:: Manuela Neptune, RN   NST - FHR: 125 bpm / moderate variability / accels present / decels absent / TOCO: irregular every 5-7 mins   Plan:  D/C home with labor precautions Keep scheduled appt with Nolene Bernheim  on 3/31  Derrel Nip, MD  10/13/2020 12:15 AM

## 2020-10-14 LAB — CULTURE, BETA STREP (GROUP B ONLY): Strep Gp B Culture: NEGATIVE

## 2020-10-18 ENCOUNTER — Encounter (HOSPITAL_COMMUNITY): Payer: Medicaid Other | Admitting: Psychiatry

## 2020-10-19 ENCOUNTER — Ambulatory Visit: Payer: Medicaid Other | Admitting: *Deleted

## 2020-10-19 ENCOUNTER — Encounter: Payer: Self-pay | Admitting: *Deleted

## 2020-10-19 ENCOUNTER — Ambulatory Visit: Payer: Medicaid Other | Attending: Obstetrics and Gynecology

## 2020-10-19 ENCOUNTER — Ambulatory Visit (INDEPENDENT_AMBULATORY_CARE_PROVIDER_SITE_OTHER): Payer: Medicaid Other | Admitting: Nurse Practitioner

## 2020-10-19 ENCOUNTER — Other Ambulatory Visit: Payer: Self-pay

## 2020-10-19 VITALS — BP 109/65 | HR 88 | Wt 144.5 lb

## 2020-10-19 DIAGNOSIS — O0990 Supervision of high risk pregnancy, unspecified, unspecified trimester: Secondary | ICD-10-CM

## 2020-10-19 DIAGNOSIS — O99353 Diseases of the nervous system complicating pregnancy, third trimester: Secondary | ICD-10-CM

## 2020-10-19 DIAGNOSIS — F319 Bipolar disorder, unspecified: Secondary | ICD-10-CM

## 2020-10-19 DIAGNOSIS — O9A213 Injury, poisoning and certain other consequences of external causes complicating pregnancy, third trimester: Secondary | ICD-10-CM

## 2020-10-19 DIAGNOSIS — R569 Unspecified convulsions: Secondary | ICD-10-CM | POA: Insufficient documentation

## 2020-10-19 DIAGNOSIS — O4693 Antepartum hemorrhage, unspecified, third trimester: Secondary | ICD-10-CM | POA: Diagnosis not present

## 2020-10-19 DIAGNOSIS — Z3A37 37 weeks gestation of pregnancy: Secondary | ICD-10-CM

## 2020-10-19 DIAGNOSIS — G40909 Epilepsy, unspecified, not intractable, without status epilepticus: Secondary | ICD-10-CM | POA: Insufficient documentation

## 2020-10-19 DIAGNOSIS — T1490XD Injury, unspecified, subsequent encounter: Secondary | ICD-10-CM

## 2020-10-19 NOTE — Progress Notes (Signed)
    Subjective:  Leah [name] is a 25 y.o. G2P1001 at [redacted]w[redacted]d being seen today for ongoing prenatal care.  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  Patient reports occasional contractions.  Contractions: Irregular. Vag. Bleeding: None.  Movement: Present. Denies leaking of fluid.   The following portions of the patient's history were reviewed and updated as appropriate: allergies, current medications, past family history, past medical history, past social history, past surgical history and problem list. Problem list updated.  Objective:   Vitals:   10/19/20 0838  BP: 109/65  Pulse: 88  Weight: 144 lb 8 oz (65.5 kg)    Fetal Status: Fetal Heart Rate (bpm): 129   Movement: Present  Presentation: Vertex  General:  Alert, oriented and cooperative. Patient is in no acute distress.  Skin: Skin is warm and dry. No rash noted.   Cardiovascular: Normal heart rate noted  Respiratory: Normal respiratory effort, no problems with respiration noted  Abdomen: Soft, gravid, appropriate for gestational age. Pain/Pressure: Absent     Pelvic:  Cervical exam performed Dilation: 3.5 Effacement (%): 50 Station: Ballotable  Extremities: Normal range of motion.  Edema: Trace  Mental Status: Normal mood and affect. Normal behavior. Normal judgment and thought content.   Urinalysis:      Assessment and Plan:  Pregnancy: G2P1001 at [redacted]w[redacted]d  1. Supervision of high risk pregnancy, antepartum Cervix unchanged from MAU visit Reviewed contractions increasing to regular and 3-5 minutes, more intense to make cervical change Really wants to have her baby before 40  weeks  2. Bipolar 1 disorder (HCC)   3. Seizure (HCC) No further seizures   Term labor symptoms and general obstetric precautions including but not limited to vaginal bleeding, contractions, leaking of fluid and fetal movement were reviewed in detail with the patient. Please refer to After Visit Summary for other counseling recommendations.  Return in about 1 week (around 10/26/2020) for in person ROB.  Nolene Bernheim, RN, MSN, NP-BC Nurse Practitioner, [facility] for [employer], [facility] 10/19/2020 10:26 AM

## 2020-10-19 NOTE — Patient Instructions (Signed)
Signs and Symptoms of Labor Labor is the body's natural process of moving the baby and the placenta out of the uterus. The process of labor usually starts when the baby is full-term, between 37 and 40 weeks of pregnancy. Signs and symptoms that you are close to going into labor As your body prepares for labor and the birth of your baby, you may notice the following symptoms in the weeks and days before true labor starts:  Passing a small amount of thick, bloody mucus from your vagina. This is called normal bloody show or losing your mucus plug. This may happen more than a week before labor begins, or right before labor begins, as the opening of the cervix starts to widen (dilate). For some women, the entire mucus plug passes at once. For others, pieces of the mucus plug may gradually pass over several days.  Your baby moving (dropping) lower in your pelvis to get into position for birth (lightening). When this happens, you may feel more pressure on your bladder and pelvic bone and less pressure on your ribs. This may make it easier to breathe. It may also cause you to need to urinate more often and have problems with bowel movements.  Having "practice contractions," also called Braxton Hicks contractions or false labor. These occur at irregular (unevenly spaced) intervals that are more than 10 minutes apart. False labor contractions are common after exercise or sexual activity. They will stop if you change position, rest, or drink fluids. These contractions are usually mild and do not get stronger over time. They may feel like: ? A backache or back pain. ? Mild cramps, similar to menstrual cramps. ? Tightening or pressure in your abdomen. Other early symptoms include:  Nausea or loss of appetite.  Diarrhea.  Having a sudden burst of energy, or feeling very tired.  Mood changes.  Having trouble sleeping.   Signs and symptoms that labor has begun Signs that you are in labor may  include:  Having contractions that come at regular (evenly spaced) intervals and increase in intensity. This may feel like more intense tightening or pressure in your abdomen that moves to your back. ? Contractions may also feel like rhythmic pain in your upper thighs or back that comes and goes at regular intervals. ? For first-time mothers, this change in intensity of contractions often occurs at a more gradual pace. ? Women who have given birth before may notice a more rapid progression of contraction changes.  Feeling pressure in the vaginal area.  Your water breaking (rupture of membranes). This is when the sac of fluid that surrounds your baby breaks. Fluid leaking from your vagina may be clear or blood-tinged. Labor usually starts within 24 hours of your water breaking, but it may take longer to begin. ? Some women may feel a sudden gush of fluid. ? Others notice that their underwear repeatedly becomes damp. Follow these instructions at home:  When labor starts, or if your water breaks, call your health care provider or nurse care line. Based on your situation, they will determine when you should go in for an exam.  During early labor, you may be able to rest and manage symptoms at home. Some strategies to try at home include: ? Breathing and relaxation techniques. ? Taking a warm bath or shower. ? Listening to music. ? Using a heating pad on the lower back for pain. If you are directed to use heat:  Place a towel between your skin and the   heat source.  Leave the heat on for 20-30 minutes.  Remove the heat if your skin turns bright red. This is especially important if you are unable to feel pain, heat, or cold. You may have a greater risk of getting burned.   Contact a health care provider if:  Your labor has started.  Your water breaks. Get help right away if:  You have painful, regular contractions that are 5 minutes apart or less.  Labor starts before you are [redacted] weeks  along in your pregnancy.  You have a fever.  You have bright red blood coming from your vagina.  You do not feel your baby moving.  You have a severe headache with or without vision problems.  You have severe nausea, vomiting, or diarrhea.  You have chest pain or shortness of breath. These symptoms may represent a serious problem that is an emergency. Do not wait to see if the symptoms will go away. Get medical help right away. Call your local emergency services (911 in the [place] Do not drive yourself to the hospital. Summary  Labor is your body's natural process of moving your baby and the placenta out of your uterus.  The process of labor usually starts when your baby is full-term, between 37 and 40 weeks of pregnancy.  When labor starts, or if your water breaks, call your health care provider or nurse care line. Based on your situation, they will determine when you should go in for an exam. This information is not intended to replace advice given to you by your health care provider. Make sure you discuss any questions you have with your health care provider. Document Revised: 04/29/2020 Document Reviewed: 04/29/2020 Elsevier Patient Education  2021 Elsevier Inc.  

## 2020-10-20 ENCOUNTER — Other Ambulatory Visit: Payer: Self-pay

## 2020-10-20 ENCOUNTER — Inpatient Hospital Stay (EMERGENCY_DEPARTMENT_HOSPITAL)
Admission: AD | Admit: 2020-10-20 | Discharge: 2020-10-20 | Disposition: A | Discharge status: Home / Self Care | Payer: Medicaid Other | Source: Home / Self Care | Attending: Obstetrics & Gynecology | Admitting: Obstetrics & Gynecology

## 2020-10-20 ENCOUNTER — Encounter (HOSPITAL_COMMUNITY): Payer: Self-pay | Admitting: Obstetrics & Gynecology

## 2020-10-20 DIAGNOSIS — O471 False labor at or after 37 completed weeks of gestation: Secondary | ICD-10-CM | POA: Diagnosis not present

## 2020-10-20 DIAGNOSIS — R1032 Left lower quadrant pain: Secondary | ICD-10-CM | POA: Insufficient documentation

## 2020-10-20 DIAGNOSIS — O26893 Other specified pregnancy related conditions, third trimester: Secondary | ICD-10-CM | POA: Insufficient documentation

## 2020-10-20 DIAGNOSIS — Z3A37 37 weeks gestation of pregnancy: Secondary | ICD-10-CM | POA: Diagnosis not present

## 2020-10-20 MED ORDER — PROMETHAZINE HCL 25 MG/ML IJ SOLN
12.5000 mg | Freq: Once | INTRAMUSCULAR | Status: DC
  Filled 2020-10-20: qty 1

## 2020-10-20 MED ORDER — LACTATED RINGERS IV BOLUS
1000.0000 mL | INTRAVENOUS | Status: AC
  Administered 2020-10-20: 1000 mL via INTRAVENOUS

## 2020-10-20 MED ORDER — MORPHINE SULFATE (PF) 4 MG/ML IV SOLN
2.0000 mg | Freq: Once | INTRAVENOUS | Status: AC
Start: 2020-10-20 — End: 2020-10-20
  Administered 2020-10-20: 2 mg via INTRAMUSCULAR
  Filled 2020-10-20: qty 1

## 2020-10-20 MED ORDER — HYDROXYZINE HCL 25 MG PO TABS: 25.0000 mg | ORAL_TABLET | Freq: Four times a day (QID) | ORAL | 0 refills | Status: DC

## 2020-10-20 MED ORDER — HYDROXYZINE HCL 50 MG/ML IM SOLN
25.0000 mg | Freq: Once | INTRAMUSCULAR | Status: AC
  Administered 2020-10-20: 25 mg via INTRAMUSCULAR
  Filled 2020-10-20: qty 0.5

## 2020-10-20 MED ORDER — MORPHINE SULFATE (PF) 4 MG/ML IV SOLN
2.0000 mg | Freq: Once | INTRAVENOUS | Status: AC
  Administered 2020-10-20: 2 mg via INTRAMUSCULAR
  Filled 2020-10-20: qty 1

## 2020-10-20 MED ORDER — MORPHINE SULFATE (PF) 4 MG/ML IV SOLN: 2.0000 mg | Freq: Once | INTRAVENOUS | Status: DC

## 2020-10-20 NOTE — MAU Provider Note (Signed)
Leah [name] is a G2P1001 at [redacted]w[redacted]d seen in MAU for labor. RN labor check.  SVE by RN Dilation: 4 Effacement (%): 70 Cervical Position: Posterior Station: -3 Presentation: Vertex Exam by:: tlytle RN  Cervix unchanged after 1 liter of LR and observation  NST - FHR: 135 bpm / moderate variability / accels present / decels absent / TOCO: regular every 2-3 mins   Plan:  D/C home with labor precautions Rx for Vistaril 25 mg po hs Advised to pick up Rx, go home, eat dinner, soak in a warm tub of water, then take medication Keep scheduled appt with [facility] on Tuesday 10/24/2020  [name], CNM  10/20/2020 7:04 PM

## 2020-10-20 NOTE — MAU Note (Signed)
Leah [name] is a 25 y.o. at [redacted]w[redacted]d here in MAU reporting: contractions since 1400, states they are every 5-6 minutes. No bleeding or LOF. + FM  Onset of complaint: today  Pain score: 9/10  Vitals:   10/20/20 1612  BP: 119/60  Pulse: (!) 105  Resp: 20  Temp: 98.3 F (36.8 C)  SpO2: 98%     FHT:140  Lab orders placed from triage: none

## 2020-10-20 NOTE — MAU Provider Note (Signed)
S: [name] is a 25 y.o. G2P1001 at [redacted]w[redacted]d  who presents to MAU today for labor evaluation.   Has been here since 1600hrs.  Has gotten 2mg  MSO4 but was still screaming.  C/O LLQ pain even between UCs.  Had changed from 3.5cm to 4cm but has had no change since then.  UCs have been irregular.   Patient stated that 37wks was full term and she wants to be delivered.   Cervical exam by RN:  Dilation: 4 Effacement (%): 70 Cervical Position: Posterior Station: -3 Presentation: Vertex Exam by:: T LYTLE RN Dilation: 4 Effacement (%): 70 Cervical Position: Middle,Posterior Station: Ballotable Presentation: Vertex Exam by:: Antionio Negron CNM  Fetal Monitoring: Baseline: 140 Variability: average Accelerations: present Decelerations: absent Contractions: very irreglar, at times has 7-32min between   MDM Discussed patient with RN. NST reviewed.  I gave her an additional 2mg  morphine and 25mg  Vistaril with some relaxation  I watched her for 10 min after rechecking her and contractons were quite spaced out.  Discussed with Dr 10m and pt/partner that with head still ballotable and no change in cervix, as well as inadequate labor pattern, cannot justify keeping her Since my exam at 2115, has stopped screaming.   A: SIUP at [redacted]w[redacted]d  Latent vs Prodromal contractions   P: Discharge home Labor precautions and kick counts included in AVS Patient to follow-up with office as scheduled  Patient may return to MAU as needed or when in labor   Debroah Loop 10/20/2020 9:54 PM

## 2020-10-20 NOTE — MAU Note (Signed)
Pt called out for RN to come to room. Was found to be sobbing and saying she was in a lot of pain. Pt was contracting q2-3 mins. Recheck of cervix unchanged. Called CNM Arita Miss  on labor team with update. LR bolus ordered. Pt was able to get some relief for a while but began crying again near time of next SVE at 1845. VE basically unchanged from previous check. Contacted CNM as patient requesting help with pain. CNM visited patient at bedside and ordered Vistaril for therapeutic rest.  PT was discharged and given instructions. Pt called out again -sobbing-  about 15-20 mins after RN discharged patient. She was found sitting on the bed crying and still in hospital gown. Patient said she couldn't get up to get dressed, was in too much pain, and asked why she is not 'full term' because it says so on her discharge papers. RN explained to patient that she was too early for an induction and that her cervix was unchanged. Pt also c/o lower left abdominal pain that is constant in addition to the cntx.  CNM made aware    Midwife called again with request for pain medicine. CNM ordered morphine and phenergan IM. Morphine given, but phenergan unavailable for IM due to national shortage per pharmacy, unless ordered by anesthesia. RN relayed this info to CNM Wynelle Bourgeois. Hilda Lias stated she would order Vistaril instead. Pt also continues to  c/o lower left abdominal pain that is constant in addition to the cntx.     IM morphine & vistaril given. CNM Hilda Lias came to MAU to see patient and rechecked her. Pt was unchanged per provider. Cntx spaced out after medication and provider spoke w/patient again and deemed her ready for discharge.  Pt discharged in wheelchair with partner. Return precautions given

## 2020-10-21 ENCOUNTER — Encounter (HOSPITAL_COMMUNITY): Payer: Self-pay | Admitting: Obstetrics and Gynecology

## 2020-10-21 ENCOUNTER — Inpatient Hospital Stay (HOSPITAL_COMMUNITY): Payer: Medicaid Other | Admitting: Anesthesiology

## 2020-10-21 ENCOUNTER — Inpatient Hospital Stay (HOSPITAL_COMMUNITY)
Admission: AD | Admit: 2020-10-21 | Discharge: 2020-10-23 | DRG: 807 | Disposition: A | Discharge status: Home / Self Care | Payer: Medicaid Other | Attending: Obstetrics and Gynecology | Admitting: Obstetrics and Gynecology

## 2020-10-21 ENCOUNTER — Other Ambulatory Visit: Payer: Self-pay

## 2020-10-21 DIAGNOSIS — D649 Anemia, unspecified: Secondary | ICD-10-CM | POA: Diagnosis not present

## 2020-10-21 DIAGNOSIS — Z20822 Contact with and (suspected) exposure to covid-19: Secondary | ICD-10-CM | POA: Diagnosis present

## 2020-10-21 DIAGNOSIS — O9902 Anemia complicating childbirth: Principal | ICD-10-CM | POA: Diagnosis present

## 2020-10-21 DIAGNOSIS — D509 Iron deficiency anemia, unspecified: Secondary | ICD-10-CM | POA: Diagnosis not present

## 2020-10-21 DIAGNOSIS — O471 False labor at or after 37 completed weeks of gestation: Secondary | ICD-10-CM | POA: Diagnosis not present

## 2020-10-21 DIAGNOSIS — Z30017 Encounter for initial prescription of implantable subdermal contraceptive: Secondary | ICD-10-CM

## 2020-10-21 DIAGNOSIS — O326XX0 Maternal care for compound presentation, not applicable or unspecified: Secondary | ICD-10-CM | POA: Diagnosis not present

## 2020-10-21 DIAGNOSIS — F319 Bipolar disorder, unspecified: Secondary | ICD-10-CM

## 2020-10-21 DIAGNOSIS — Z3A37 37 weeks gestation of pregnancy: Secondary | ICD-10-CM | POA: Diagnosis not present

## 2020-10-21 DIAGNOSIS — O26893 Other specified pregnancy related conditions, third trimester: Secondary | ICD-10-CM | POA: Diagnosis not present

## 2020-10-21 DIAGNOSIS — R569 Unspecified convulsions: Secondary | ICD-10-CM

## 2020-10-21 DIAGNOSIS — O0990 Supervision of high risk pregnancy, unspecified, unspecified trimester: Secondary | ICD-10-CM

## 2020-10-21 LAB — TYPE AND SCREEN
ABO/RH(D): A POS
Antibody Screen: NEGATIVE

## 2020-10-21 LAB — CBC
HCT: 31.1 % — ABNORMAL LOW (ref 36.0–46.0)
Hemoglobin: 10.1 g/dL — ABNORMAL LOW (ref 12.0–15.0)
MCH: 29.4 pg (ref 26.0–34.0)
MCHC: 32.5 g/dL (ref 30.0–36.0)
MCV: 90.7 fL (ref 80.0–100.0)
Platelets: 253 10*3/uL (ref 150–400)
RBC: 3.43 MIL/uL — ABNORMAL LOW (ref 3.87–5.11)
RDW: 15 % (ref 11.5–15.5)
WBC: 7.3 10*3/uL (ref 4.0–10.5)
nRBC: 0 % (ref 0.0–0.2)

## 2020-10-21 LAB — RESP PANEL BY RT-PCR (FLU A&B, COVID) ARPGX2
Influenza A by PCR: NEGATIVE
Influenza B by PCR: NEGATIVE
SARS Coronavirus 2 by RT PCR: NEGATIVE

## 2020-10-21 MED ORDER — OXYTOCIN-SODIUM CHLORIDE 30-0.9 UT/500ML-% IV SOLN
2.5000 [IU]/h | INTRAVENOUS | Status: DC
  Administered 2020-10-22: 2.5 [IU]/h via INTRAVENOUS
  Filled 2020-10-21: qty 500

## 2020-10-21 MED ORDER — SOD CITRATE-CITRIC ACID 500-334 MG/5ML PO SOLN: 30.0000 mL | ORAL | Status: DC | PRN

## 2020-10-21 MED ORDER — ACETAMINOPHEN 325 MG PO TABS: 650.0000 mg | ORAL_TABLET | ORAL | Status: DC | PRN

## 2020-10-21 MED ORDER — OXYCODONE-ACETAMINOPHEN 5-325 MG PO TABS: 2.0000 | ORAL_TABLET | ORAL | Status: DC | PRN

## 2020-10-21 MED ORDER — FENTANYL CITRATE (PF) 100 MCG/2ML IJ SOLN
INTRAMUSCULAR | Status: AC
  Administered 2020-10-21: 50 ug via INTRAVENOUS
  Filled 2020-10-21: qty 2

## 2020-10-21 MED ORDER — OXYCODONE-ACETAMINOPHEN 5-325 MG PO TABS: 1.0000 | ORAL_TABLET | ORAL | Status: DC | PRN

## 2020-10-21 MED ORDER — OXYTOCIN BOLUS FROM INFUSION
333.0000 mL | Freq: Once | INTRAVENOUS | Status: AC
  Administered 2020-10-22: 333 mL via INTRAVENOUS

## 2020-10-21 MED ORDER — PHENYLEPHRINE 40 MCG/ML (10ML) SYRINGE FOR IV PUSH (FOR BLOOD PRESSURE SUPPORT)
80.0000 ug | PREFILLED_SYRINGE | INTRAVENOUS | Status: DC | PRN
  Filled 2020-10-21: qty 10

## 2020-10-21 MED ORDER — ONDANSETRON HCL 4 MG/2ML IJ SOLN
4.0000 mg | Freq: Four times a day (QID) | INTRAMUSCULAR | Status: DC | PRN
  Administered 2020-10-22: 4 mg via INTRAVENOUS
  Filled 2020-10-21: qty 2

## 2020-10-21 MED ORDER — LIDOCAINE HCL (PF) 1 % IJ SOLN: 30.0000 mL | INTRAMUSCULAR | Status: DC | PRN

## 2020-10-21 MED ORDER — LACTATED RINGERS IV SOLN: INTRAVENOUS | Status: DC

## 2020-10-21 MED ORDER — LACTATED RINGERS IV SOLN
500.0000 mL | INTRAVENOUS | Status: DC | PRN
  Administered 2020-10-22: 500 mL via INTRAVENOUS

## 2020-10-21 MED ORDER — EPHEDRINE 5 MG/ML INJ: 10.0000 mg | INTRAVENOUS | Status: DC | PRN

## 2020-10-21 MED ORDER — LACTATED RINGERS IV SOLN
500.0000 mL | Freq: Once | INTRAVENOUS | Status: AC
Start: 2020-10-21 — End: 2020-10-21
  Administered 2020-10-21: 500 mL via INTRAVENOUS

## 2020-10-21 MED ORDER — DIPHENHYDRAMINE HCL 50 MG/ML IJ SOLN: 12.5000 mg | INTRAMUSCULAR | Status: DC | PRN

## 2020-10-21 MED ORDER — FENTANYL-BUPIVACAINE-NACL 0.5-0.125-0.9 MG/250ML-% EP SOLN
12.0000 mL/h | EPIDURAL | Status: DC | PRN
  Administered 2020-10-21: 14 mL/h via EPIDURAL
  Filled 2020-10-21: qty 250

## 2020-10-21 MED ORDER — CALCIUM CARBONATE ANTACID 500 MG PO CHEW
2.0000 | CHEWABLE_TABLET | Freq: Three times a day (TID) | ORAL | Status: DC | PRN
  Administered 2020-10-21 – 2020-10-22 (×2): 400 mg via ORAL
  Filled 2020-10-21 (×2): qty 2

## 2020-10-21 MED ORDER — PHENYLEPHRINE 40 MCG/ML (10ML) SYRINGE FOR IV PUSH (FOR BLOOD PRESSURE SUPPORT)
80.0000 ug | PREFILLED_SYRINGE | INTRAVENOUS | Status: DC | PRN
  Administered 2020-10-22: 80 ug via INTRAVENOUS

## 2020-10-21 MED ORDER — FLEET ENEMA 7-19 GM/118ML RE ENEM: 1.0000 | ENEMA | Freq: Every day | RECTAL | Status: DC | PRN

## 2020-10-21 MED ORDER — FENTANYL CITRATE (PF) 100 MCG/2ML IJ SOLN
50.0000 ug | INTRAMUSCULAR | Status: DC | PRN
  Administered 2020-10-21: 50 ug via INTRAVENOUS
  Filled 2020-10-21: qty 2

## 2020-10-21 MED ORDER — LIDOCAINE HCL (PF) 1 % IJ SOLN
INTRAMUSCULAR | Status: DC | PRN
  Administered 2020-10-21: 4 mL via EPIDURAL
  Administered 2020-10-21: 5 mL via EPIDURAL

## 2020-10-21 NOTE — Anesthesia Preprocedure Evaluation (Addendum)
Anesthesia Evaluation  Patient identified by MRN, date of birth, ID band Patient awake    Reviewed: Allergy & Precautions, NPO status , Patient's Chart, lab work & pertinent test results  History of Anesthesia Complications Negative for: history of anesthetic complications  Airway Mallampati: III   Neck ROM: Full    Dental  (+) Partial Upper   Pulmonary neg pulmonary ROS,    Pulmonary exam normal        Cardiovascular negative cardio ROS Normal cardiovascular exam     Neuro/Psych PSYCHIATRIC DISORDERS Depression Bipolar Disorder  Hx suicide attempt  Hx pseudoseizures     GI/Hepatic GERD  ,(+)     substance abuse  marijuana use,   Endo/Other  negative endocrine ROS  Renal/GU negative Renal ROS     Musculoskeletal negative musculoskeletal ROS (+)   Abdominal   Peds  Hematology  (+) anemia ,  Plt 253k    Anesthesia Other Findings   Reproductive/Obstetrics                            Anesthesia Physical Anesthesia Plan  ASA: II  Anesthesia Plan: Epidural   Post-op Pain Management:    Induction:   PONV Risk Score and Plan: 2 and Treatment may vary due to age or medical condition  Airway Management Planned: Natural Airway  Additional Equipment: None  Intra-op Plan:   Post-operative Plan:   Informed Consent: I have reviewed the patients History and Physical, chart, labs and discussed the procedure including the risks, benefits and alternatives for the proposed anesthesia with the patient or authorized representative who has indicated his/her understanding and acceptance.       Plan Discussed with: Anesthesiologist  Anesthesia Plan Comments: (Labs reviewed. Platelets acceptable, patient not taking any blood thinning medications. Per RN, FHR tracing reported to be stable enough for sitting procedure. Risks and benefits discussed with patient, including PDPH, backache,  epidural hematoma, failed epidural, blood pressure changes, allergic reaction, and nerve injury. Patient expressed understanding and wished to proceed.)        Anesthesia Quick Evaluation

## 2020-10-21 NOTE — MAU Note (Signed)
Pt is a G2P1 at 37.4 weeks, had an episode of bright red bleeding after sex today and shortly after began contracting.  Reports h/o hyperemesis and seizures this pregnancy.  Last seizure was several weeks ago, and last episode of vomiting was yesterday.    She was in MAU yesterday for contractions and was found to be 3.5/70/Ballotable, check today was 4/70/-3 with bloody show.

## 2020-10-21 NOTE — H&P (Signed)
OBSTETRIC ADMISSION HISTORY AND PHYSICAL  Leah [name] is a 25 y.o. female G2P1001 with IUP at 75w4dby LMP presenting for labor. She reports +FMs, No LOF, no VB, no blurry vision, headaches or peripheral edema, and RUQ pain.  She plans on breast feeding. She request nexplanon for birth control. [facility]  Dating: By LMP --->  Estimated Date of Delivery: 11/07/20  Sono:    _0 , CWD, normal anatomy, cephalic presentation, [id number] 60% EFW  Prenatal History/Complications: -pseudoseizures -hyperemesis  -depression, possible Biopolar disorder: follows with Psych NP, sees counselor regularly   -anemia   Past Medical History: Past Medical History:  Diagnosis Date  . Anemia 2013  . Bipolar 1 disorder ([place]   . Gall stones   . GERD (gastroesophageal reflux disease)   . 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 CHOLECYSTECTOMY N/A 09/28/2014   Procedure: LAPAROSCOPIC CHOLECYSTECTOMY;  Surgeon: ARalene Ok MD;  [place]  Service: General;  Laterality: N/A;  . DIRECT LARYNGOSCOPY N/A 04/24/2014   Procedure: DIRECT LARYNGOSCOPY;  Surgeon: SAscencion Dike MD;  [place]  Service: ENT;  Laterality: N/A;  . FOREIGN BODY REMOVAL ESOPHAGEAL N/A 04/24/2014   Procedure: REMOVAL FOREIGN BODY ESOPHAGEAL;  Surgeon: SAscencion Dike MD;  [place]  Service: ENT;  Laterality: N/A;  . WISDOM TOOTH EXTRACTION  2013    Obstetrical History: OB History    Gravida  2   Para  1   Term  1   Preterm  0   AB  0   Living  1     SAB  0   IAB  0   Ectopic  0   Multiple      Live Births  1           Social History Social History   Socioeconomic History  . Marital status: Single    Spouse name: Not on file  . Number of children: 1  . Years of education: 138th   . Highest education level: Not on file  Occupational History  . 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 Smoking status: Never Smoker  . Smokeless tobacco: Never Used  . Tobacco comment: marjiuna  Vaping Use  . Vaping Use: Never used  Substance and Sexual Activity  . Alcohol use: Not Currently    Alcohol/week: 6.0 standard drinks    Types: 6 Cans of beer per week  . Drug use: Yes    Types: Marijuana    Comment: last used before pregnancy  . Sexual activity: Yes    Partners: Male    Birth control/protection: Patch  Other Topics Concern  . Not on file  Social History Narrative   ** Merged History Encounter **       Live with roommate and son.    [facility] [employer]    Social Determinants of Health   Financial Resource Strain: Low Risk   . Difficulty of Paying Living Expenses: Not hard at all  Food Insecurity: No Food Insecurity  . Worried About RCharity fundraiserin the Last Year: Never true  . Ran Out of Food in the Last Year: Never true  Transportation Needs: No Transportation Needs  . Lack of Transportation (Medical): No  . Lack of Transportation (Non-Medical): No  Physical Activity: Inactive  . Days of Exercise per Week: 0  days  . Minutes of Exercise per Session: 0 min  Stress: No Stress Concern Present  . Feeling of Stress : Only a little  Social Connections: Socially Isolated  . Frequency of Communication with Friends and Family: Once a week  . Frequency of Social Gatherings with Friends and Family: Never  . Attends Religious Services: Never  . Active Member of Clubs or Organizations: No  . Attends Archivist Meetings: Never  . Marital Status: Never married    Family History: Family History  Problem Relation Age of Onset  . Arthritis Mother   . Bronchitis Mother   . Asthma Mother   . Hearing loss Paternal Grandfather   . Diabetes Neg Hx   . Stomach cancer Neg Hx   . Colon cancer Neg Hx     Allergies: Allergies  Allergen Reactions  . Bactrim [Sulfamethoxazole-Trimethoprim] Rash    Medications Prior to Admission   Medication Sig Dispense Refill Last Dose  . Blood Pressure Monitoring (BLOOD PRESSURE KIT) DEVI 1 Device by Does not apply route as needed. 1 each 0   . calcium carbonate (TUMS - DOSED IN MG ELEMENTAL CALCIUM) 500 MG chewable tablet Chew 1 tablet by mouth daily.     . cyclobenzaprine (FLEXERIL) 10 MG tablet Take 1 tablet (10 mg total) by mouth every 8 (eight) hours as needed for muscle spasms. (Patient not taking: Reported on 10/19/2020) 30 tablet 0   . hydrOXYzine (ATARAX/VISTARIL) 25 MG tablet Take 1 tablet (25 mg total) by mouth every 6 (six) hours. 12 tablet 0   . ondansetron (ZOFRAN ODT) 8 MG disintegrating tablet Take 1 tablet (8 mg total) by mouth every 8 (eight) hours as needed for nausea or vomiting. (Patient not taking: Reported on 10/19/2020) 40 tablet 0   . promethazine (PHENERGAN) 25 MG tablet Take 1 tablet (25 mg total) by mouth every 6 (six) hours as needed for nausea or vomiting. (Patient not taking: Reported on 10/19/2020) 30 tablet 0      Review of Systems   All systems reviewed and negative except as stated in HPI  Blood pressure 121/74, pulse (!) 101, temperature 98.5 F (36.9 C), temperature source Oral, resp. rate 18, last menstrual period 02/01/2020, SpO2 100 %. General appearance: alert, cooperative and appears stated age Lungs: clear to auscultation bilaterally Heart: regular rate and rhythm Abdomen: soft, non-tender; bowel sounds normal Extremities: Homans sign is negative, no sign of DVT Presentation: cephalic Fetal monitoringbaseline 120, mod variability, pos accels, no decels Uterine activity q2-4 mins  Dilation: 5.5 Effacement (%): 80 Station: Ballotable Exam by:: Marcelyn Bruins, RN   Prenatal labs: ABO, Rh: A/Positive/-- (09/21 1017) Antibody: Negative (09/21 1017) Rubella: 1.72 (09/21 1017) RPR: Non Reactive (01/28 0902)  HBsAg: Negative (09/21 1017)  HIV: Non Reactive (01/28 0902)  GBS: Negative/-- (03/22 0849)  2 hr Glucola normal  Genetic screening   LR NIPS, neg horizon Anatomy US normal  Prenatal Transfer Tool  Maternal Diabetes: No Genetic Screening: Normal Maternal Ultrasounds/Referrals: Normal Fetal Ultrasounds or other Referrals:  None Maternal Substance Abuse:  No Significant Maternal Medications:  no Significant Maternal Lab Results:  gbs neg   No results found for this or any previous visit (from the past 24 hour(s)).  Patient Active Problem List   Diagnosis Date Noted  . Hyperemesis gravidarum 05/08/2020  . Supervision of high risk pregnancy, antepartum 03/28/2020  . Bipolar 1 disorder ([place])   . GERD (gastroesophageal reflux disease)   . Severe recurrent major depression without psychotic  features ([place]) 12/31/2018  . Suicide attempt by drug ingestion ([place])   . Anemia, iron deficiency 12/01/2012  . Seizure [facility]) 2011    Assessment/Plan:  Leah [name] is a 25 y.o. G2P1001 at 58w4dhere for labor  #Labor: expectant management  #Pain: Pain meds, epidural prn  #FWB: Cat I  #ID:  gbs neg  #MOF:  breast #MOC: nexplanon  #Circ:  N/a   #Normocytic Anemia: Admission hgb 10.1   #Depression, history of suicide attempt: follows with psych outpatient, has plans to restart zyprexa postpartum. SW consult postpartum   JJanet Berlin MD  10/21/2020, 8:22 PM

## 2020-10-21 NOTE — Anesthesia Procedure Notes (Signed)
Epidural Patient location during procedure: OB Start time: 10/21/2020 9:45 PM End time: 10/21/2020 9:48 PM  Staffing Anesthesiologist: Beryle Lathe, MD Performed: anesthesiologist   Preanesthetic Checklist Completed: patient identified, IV checked, risks and benefits discussed, monitors and equipment checked, pre-op evaluation and timeout performed  Epidural Patient position: sitting Prep: DuraPrep Patient monitoring: continuous pulse ox and blood pressure Approach: midline Location: L2-L3 Injection technique: LOR saline  Needle:  Needle type: Tuohy  Needle gauge: 17 G Needle length: 9 cm Needle insertion depth: 5 cm Catheter size: 19 Gauge Catheter at skin depth: 10 cm Test dose: negative and Other (1% lidocaine)  Assessment Events: blood not aspirated  Additional Notes Patient identified. Risks including, but not limited to, bleeding, infection, nerve damage, paralysis, inadequate analgesia, blood pressure changes, nausea, vomiting, allergic reaction, postpartum back pain, itching, and headache were discussed. Patient expressed understanding and wished to proceed. Sterile prep and drape, including hand hygiene, mask, and sterile gloves were used. The patient was positioned and the spine was prepped. The skin was anesthetized with lidocaine. No paraesthesia or other complication noted. The patient did not experience any signs of intravascular injection such as tinnitus or metallic taste in mouth, nor signs of intrathecal spread such as rapid motor block. Please see nursing notes for vital signs. The patient tolerated the procedure well.   Leslye Peer, MDReason for block:procedure for pain

## 2020-10-22 ENCOUNTER — Encounter (HOSPITAL_COMMUNITY): Payer: Self-pay | Admitting: Obstetrics and Gynecology

## 2020-10-22 DIAGNOSIS — O326XX0 Maternal care for compound presentation, not applicable or unspecified: Secondary | ICD-10-CM

## 2020-10-22 DIAGNOSIS — Z3A37 37 weeks gestation of pregnancy: Secondary | ICD-10-CM

## 2020-10-22 LAB — RPR: RPR Ser Ql: NONREACTIVE

## 2020-10-22 MED ORDER — IBUPROFEN 600 MG PO TABS
600.0000 mg | ORAL_TABLET | Freq: Four times a day (QID) | ORAL | Status: DC
  Administered 2020-10-22 – 2020-10-23 (×4): 600 mg via ORAL
  Filled 2020-10-22 (×4): qty 1

## 2020-10-22 MED ORDER — ONDANSETRON HCL 4 MG/2ML IJ SOLN: 4.0000 mg | INTRAMUSCULAR | Status: DC | PRN

## 2020-10-22 MED ORDER — TERBUTALINE SULFATE 1 MG/ML IJ SOLN: 0.2500 mg | Freq: Once | INTRAMUSCULAR | Status: DC | PRN

## 2020-10-22 MED ORDER — OXYCODONE-ACETAMINOPHEN 5-325 MG PO TABS
2.0000 | ORAL_TABLET | ORAL | Status: DC | PRN
  Administered 2020-10-22 – 2020-10-23 (×3): 2 via ORAL
  Filled 2020-10-22 (×4): qty 2

## 2020-10-22 MED ORDER — OXYCODONE-ACETAMINOPHEN 5-325 MG PO TABS: 1.0000 | ORAL_TABLET | ORAL | Status: DC | PRN

## 2020-10-22 MED ORDER — SIMETHICONE 80 MG PO CHEW: 80.0000 mg | CHEWABLE_TABLET | ORAL | Status: DC | PRN

## 2020-10-22 MED ORDER — TETANUS-DIPHTH-ACELL PERTUSSIS 5-2.5-18.5 LF-MCG/0.5 IM SUSY: 0.5000 mL | PREFILLED_SYRINGE | Freq: Once | INTRAMUSCULAR | Status: DC

## 2020-10-22 MED ORDER — BUPIVACAINE HCL (PF) 0.25 % IJ SOLN
INTRAMUSCULAR | Status: DC | PRN
  Administered 2020-10-22: 10 mL via EPIDURAL

## 2020-10-22 MED ORDER — ONDANSETRON HCL 4 MG PO TABS: 4.0000 mg | ORAL_TABLET | ORAL | Status: DC | PRN

## 2020-10-22 MED ORDER — ACETAMINOPHEN 325 MG PO TABS
650.0000 mg | ORAL_TABLET | ORAL | Status: DC | PRN
  Administered 2020-10-22: 650 mg via ORAL
  Filled 2020-10-22: qty 2

## 2020-10-22 MED ORDER — DIBUCAINE (PERIANAL) 1 % EX OINT: 1.0000 "application " | TOPICAL_OINTMENT | CUTANEOUS | Status: DC | PRN

## 2020-10-22 MED ORDER — MEASLES, MUMPS & RUBELLA VAC IJ SOLR: 0.5000 mL | Freq: Once | INTRAMUSCULAR | Status: DC

## 2020-10-22 MED ORDER — WITCH HAZEL-GLYCERIN EX PADS: 1.0000 "application " | MEDICATED_PAD | CUTANEOUS | Status: DC | PRN

## 2020-10-22 MED ORDER — BENZOCAINE-MENTHOL 20-0.5 % EX AERO: 1.0000 "application " | INHALATION_SPRAY | CUTANEOUS | Status: DC | PRN

## 2020-10-22 MED ORDER — MAGNESIUM HYDROXIDE 400 MG/5ML PO SUSP: 30.0000 mL | ORAL | Status: DC | PRN

## 2020-10-22 MED ORDER — OXYCODONE HCL 5 MG PO TABS: 10.0000 mg | ORAL_TABLET | ORAL | Status: DC | PRN

## 2020-10-22 MED ORDER — OLANZAPINE 2.5 MG PO TABS
2.5000 mg | ORAL_TABLET | Freq: Every day | ORAL | Status: DC
  Administered 2020-10-22: 2.5 mg via ORAL
  Filled 2020-10-22 (×2): qty 1

## 2020-10-22 MED ORDER — OXYTOCIN-SODIUM CHLORIDE 30-0.9 UT/500ML-% IV SOLN
1.0000 m[IU]/min | INTRAVENOUS | Status: DC
Start: 2020-10-22 — End: 2020-10-22
  Administered 2020-10-22: 4 m[IU]/min via INTRAVENOUS
  Administered 2020-10-22: 2 m[IU]/min via INTRAVENOUS

## 2020-10-22 MED ORDER — COCONUT OIL OIL: 1.0000 "application " | TOPICAL_OIL | Status: DC | PRN

## 2020-10-22 MED ORDER — DIPHENHYDRAMINE HCL 25 MG PO CAPS: 25.0000 mg | ORAL_CAPSULE | Freq: Four times a day (QID) | ORAL | Status: DC | PRN

## 2020-10-22 MED ORDER — PRENATAL MULTIVITAMIN CH
1.0000 | ORAL_TABLET | Freq: Every day | ORAL | Status: DC
  Administered 2020-10-22: 1 via ORAL
  Filled 2020-10-22: qty 1

## 2020-10-22 MED ORDER — OXYCODONE HCL 5 MG PO TABS
5.0000 mg | ORAL_TABLET | ORAL | Status: DC | PRN
Start: 2020-10-22 — End: 2020-10-23
  Administered 2020-10-22: 5 mg via ORAL
  Filled 2020-10-22: qty 1

## 2020-10-22 NOTE — Lactation Note (Signed)
Lactation Consultation Note  Patient Name: Leah [name] CNOBS'J Date: 10/22/2020 Reason for consult: L&D Initial assessment;Early term 40-38.6wks Age:25 y.o.  Mom states infant latched easily after delivery.   RN was assessing infant and about to measure the baby.  Mom attempted to breastfeed her first child but he had difficulty latching.   Mom did not specify how long her first was breastfed.   LC reviewed importance of STS and feeding with cues; at least 8 times in 24 hours.  Discussed with mom importance of hand expression.  Mom was getting her gown on when [facility] was in L&D and is aware LC will visit her later today on the mother baby unit to review hand express and hopefully observe infant feed.   Maternal Data Does the patient have breastfeeding experience prior to this delivery?: Yes  Feeding Mother's Current Feeding Choice: Breast Milk  LATCH Score                    Lactation Tools Discussed/Used    Interventions    Discharge    Consult Status Consult Status: Follow-up Date: 10/22/20 Follow-up type: In-patient    Maryruth Hancock [facility] 10/22/2020, 9:54 AM

## 2020-10-22 NOTE — Progress Notes (Signed)
Labor Progress Note Leah [name] is a 25 y.o. G2P1001 at [redacted]w[redacted]d presented for labor S: Able to get some rest, not feeling contractions   O:  BP (!) 80/47   Pulse 98   Temp 98.7 F (37.1 C) (Oral)   Resp 18   Ht 5' (1.524 m)   Wt 65.3 kg   LMP 02/01/2020 (Exact Date)   SpO2 100%   BMI 28.12 kg/m  EFM: baseline 130/mod variability/pos accels/no decels  CVE: Dilation: 7 Effacement (%): 80 Cervical Position: Middle Station: -1 Presentation: Vertex Exam by:: Dr. Myriam Jacobson   A&P: 25 y.o. G2P1001 [redacted]w[redacted]d here in labor.  #Labor: Progressing well. AROM performed after informed consent. Clear fluid. Anticipate SVD  #Pain: epidural  #FWB: cat I  #GBS negative   Gita Kudo, MD 5:06 AM

## 2020-10-22 NOTE — Plan of Care (Signed)
  Problem: Education: Goal: Knowledge of General Education information will improve Description: Including pain rating scale, medication(s)/side effects and non-pharmacologic comfort measures Outcome: Progressing   Problem: Health Behavior/Discharge Planning: Goal: Ability to manage health-related needs will improve Outcome: Progressing   Problem: Clinical Measurements: Goal: Ability to maintain clinical measurements within normal limits will improve Outcome: Progressing Goal: Will remain free from infection Outcome: Progressing Goal: Diagnostic test results will improve Outcome: Progressing Goal: Respiratory complications will improve Outcome: Progressing Goal: Cardiovascular complication will be avoided Outcome: Progressing   Problem: Activity: Goal: Risk for activity intolerance will decrease Outcome: Progressing   Problem: Nutrition: Goal: Adequate nutrition will be maintained Outcome: Progressing   Problem: Coping: Goal: Level of anxiety will decrease Outcome: Progressing   Problem: Elimination: Goal: Will not experience complications related to bowel motility Outcome: Progressing Goal: Will not experience complications related to urinary retention Outcome: Progressing   Problem: Pain Managment: Goal: General experience of comfort will improve Outcome: Progressing   Problem: Safety: Goal: Ability to remain free from injury will improve Outcome: Progressing   Problem: Skin Integrity: Goal: Risk for impaired skin integrity will decrease Outcome: Progressing   Problem: Education: Goal: Knowledge of Childbirth will improve Outcome: Completed/Met Goal: Ability to make informed decisions regarding treatment and plan of care will improve Outcome: Completed/Met Goal: Ability to state and carry out methods to decrease the pain will improve Outcome: Completed/Met Goal: Individualized Educational Video(s) Outcome: Completed/Met   Problem: Coping: Goal: Ability  to verbalize concerns and feelings about labor and delivery will improve Outcome: Completed/Met   Problem: Life Cycle: Goal: Ability to make normal progression through stages of labor will improve Outcome: Completed/Met Goal: Ability to effectively push during vaginal delivery will improve Outcome: Completed/Met   Problem: Role Relationship: Goal: Will demonstrate positive interactions with the child Outcome: Completed/Met   Problem: Safety: Goal: Risk of complications during labor and delivery will decrease Outcome: Completed/Met   Problem: Pain Management: Goal: Relief or control of pain from uterine contractions will improve Outcome: Completed/Met

## 2020-10-22 NOTE — Lactation Note (Signed)
This note was copied from a baby's chart. Lactation Consultation Note  Patient Name: Leah [name] Today's Date: 10/22/2020 Reason for consult: Follow-up assessment;Early term 37-38.6wks Age:26 hours  Visited with mom of 10 hours old ETI female, she's a P2 but not very experienced BF. Mom told LC she just finished feeding baby at the breast when [facility] entered the room. Per mom BF is going well so far and feedings at the breast are comfortable.   However, she noted that had some redness on her nipples but no pain. LC assisted mom with hand expression and she was able to get creamy colostrum easily, praised her for her efforts; nipples looked intact and without signs of trauma.    Reviewed tips for a deep latch, showed mom sandwich hold and asked her to call for latch assistance when needed. Reviewed normal newborn behavior, cluster feeding, feeding cues, size of baby's stomach and prevention/treatment of sore nipples.  Feeding plan:  1. Encouraged mom to feed baby STS 8-12 times/24 hours or sooner if feeding cues are present 2. Hand expression and spoon feeding were also encouraged  BF brochure, BF resources and feeding diary were reviewed. FOB present at the time of [facility] consultation. Parents reported all questions and concerns were answered, they're both aware of LC OP services and will call PRN.   Maternal Data Has patient been taught Hand Expression?: Yes Does the patient have breastfeeding experience prior to this delivery?: Yes How long did the patient breastfeed?: 3 months but no everyday (baby was mostly on formula)  Feeding Mother's Current Feeding Choice: Breast Milk  LATCH Score Latch: Grasps breast easily, tongue down, lips flanged, rhythmical sucking.  Audible Swallowing: A few with stimulation  Type of Nipple: Everted at rest and after stimulation  Comfort (Breast/Nipple): Soft / non-tender  Hold (Positioning): No assistance needed to correctly position infant at  breast.  LATCH Score: 9   Lactation Tools Discussed/Used    Interventions Interventions: Breast feeding basics reviewed;Breast compression;Hand express;Breast massage  Discharge Pump: Personal (Medela DEBP) WIC Program: Yes  Consult Status Consult Status: Follow-up Date: 10/23/20 Follow-up type: In-patient    Leah [name] 10/22/2020, 6:54 PM

## 2020-10-22 NOTE — Discharge Summary (Signed)
Postpartum Discharge Summary  Date of Service updated     Patient Name: Leah [name] DOB: 1996-05-12 MRN: [id number]  Date of admission: 10/21/2020 Delivery date:10/22/2020  Delivering provider: Manya Silvas  Date of discharge: 10/23/2020  Admitting diagnosis: Normal labor [O80, Z37.9] Intrauterine pregnancy: [redacted]w[redacted]d    Secondary diagnosis:  Active Problems:   Labor and delivery, indication for care   Vaginal delivery  Additional problems: Hyperemeis Gravidarum   Discharge diagnosis: Term Pregnancy Delivered                                              Post partum procedures:Nexplanon Augmentation: AROM and Pitocin Complications: None  Hospital course: Onset of Labor With Vaginal Delivery      25y.o. yo G2P1001 at 35w5das admitted in Active Labor on 10/21/2020. Patient had an uncomplicated labor course as follows:  Membrane Rupture Time/Date: 4:56 AM ,10/22/2020   Delivery Method:Vaginal, Spontaneous  Episiotomy: None  Lacerations:    Patient had an uncomplicated postpartum course.  She is ambulating, tolerating a regular diet, passing flatus, and urinating well. Patient is discharged home in stable condition on 10/23/20.  Newborn Data: Birth date:10/22/2020  Birth time:8:50 AM  Gender:Female  Living status:Living  Apgars:8 ,9  Weight:3005 g   Magnesium Sulfate received: No BMZ received: No Rhophylac:N/A MMR:N/A T-DaP:Given prenatally Flu: No Transfusion:No  Physical exam  Vitals:   10/22/20 1535 10/22/20 1948 10/22/20 2329 10/23/20 0621  BP: 101/64 101/67 108/63 (!) 83/46  Pulse: 81 71 75 70  Resp: _0 Temp: 98.2 F (36.8 C) 98.2 F (36.8 C) 97.6 F (36.4 C) 97.8 F (36.6 C)  TempSrc: Oral Oral Oral Oral  SpO2:  99% 99% 100%  Weight:      Height:       General: alert, cooperative and no distress Lochia: appropriate Uterine Fundus: firm Incision: NA DVT Evaluation: NA Labs: Lab Results  Component Value Date   WBC 7.3 10/21/2020   HGB  10.1 (L) 10/21/2020   HCT 31.1 (L) 10/21/2020   MCV 90.7 10/21/2020   PLT 253 10/21/2020   CMP Latest Ref Rng & Units 06/09/2020  Glucose 70 - 99 mg/dL 81  BUN 6 - 20 mg/dL 5(L)  Creatinine 0.44 - 1.00 mg/dL 0.55  Sodium 135 - 145 mmol/L 137  Potassium 3.5 - 5.1 mmol/L 3.5  Chloride 98 - 111 mmol/L 108  CO2 22 - 32 mmol/L 22  Calcium 8.9 - 10.3 mg/dL 9.1  Total Protein 6.5 - 8.1 g/dL 6.3(L)  Total Bilirubin 0.3 - 1.2 mg/dL 0.4  Alkaline Phos 38 - 126 U/L 53  AST 15 - 41 U/L 19  ALT 0 - 44 U/L 20   Edinburgh Score: Edinburgh Postnatal Depression Scale Screening Tool 10/23/2020  I have been able to laugh and see the funny side of things. 0  I have looked forward with enjoyment to things. 0  I have blamed myself unnecessarily when things went wrong. 2  I have been anxious or worried for no good reason. 0  I have felt scared or panicky for no good reason. 0  Things have been getting on top of me. 0  I have been so unhappy that I have had difficulty sleeping. 0  I have felt sad or miserable. 1  I have been so unhappy that I have  been crying. 1  The thought of harming myself has occurred to me. 0  Edinburgh Postnatal Depression Scale Total 4     After visit meds:  Allergies as of 10/23/2020      Reactions   Bactrim [sulfamethoxazole-trimethoprim] Rash      Medication List    STOP taking these medications   Blood Pressure Kit Devi   calcium carbonate 500 MG chewable tablet Commonly known as: TUMS - dosed in mg elemental calcium   cyclobenzaprine 10 MG tablet Commonly known as: FLEXERIL   hydrOXYzine 25 MG tablet Commonly known as: ATARAX/VISTARIL   ondansetron 8 MG disintegrating tablet Commonly known as: Zofran ODT   promethazine 25 MG tablet Commonly known as: PHENERGAN     TAKE these medications   acetaminophen 325 MG tablet Commonly known as: Tylenol Take 2 tablets (650 mg total) by mouth every 4 (four) hours as needed (for pain scale < 4).   ibuprofen 600  MG tablet Commonly known as: ADVIL Take 1 tablet (600 mg total) by mouth every 6 (six) hours.   OLANZapine 5 MG tablet Commonly known as: ZYPREXA Take 1 tablet (5 mg total) by mouth at bedtime.   ondansetron 4 MG tablet Commonly known as: ZOFRAN Take 1 tablet (4 mg total) by mouth every 4 (four) hours as needed for nausea.   witch hazel-glycerin pad Commonly known as: TUCKS Apply 1 application topically as needed for hemorrhoids.        Discharge home in stable condition Infant Feeding: Breast Infant Disposition:home with mother Discharge instruction: per After Visit Summary and Postpartum booklet. Activity: Advance as tolerated. Pelvic rest for 6 weeks.  Diet: routine diet Future Appointments: Future Appointments  Date Time Provider [place]  10/24/2020  8:55 AM Tresea Mall, CNM [facility]  11/01/2020  8:35 AM Gabriel Carina, CNM [facility]  11/15/2020  8:30 AM Salley Slaughter, NP [facility] None   Follow up Visit:   Please schedule this patient for a Virtual postpartum visit in 4 weeks with the following provider: Any provider. Additional Postpartum F/U:Postpartum Depression checkup  In 2 weeks for Hx Bipolar, SI Low risk pregnancy complicated by: Bipolar disorder and hyperemesis gravidarum Delivery mode:  Vaginal, Spontaneous  Anticipated Birth Control:  PP Nexplanon placed   10/23/2020 Starr Lake, CNM

## 2020-10-22 NOTE — Anesthesia Postprocedure Evaluation (Signed)
Anesthesia Post Note  Patient: Leah [name]  Procedure(s) Performed: AN AD HOC LABOR EPIDURAL     Patient location during evaluation: Mother Baby Anesthesia Type: Epidural Level of consciousness: awake and alert Pain management: pain level controlled Vital Signs Assessment: post-procedure vital signs reviewed and stable Respiratory status: spontaneous breathing, nonlabored ventilation and respiratory function stable Cardiovascular status: stable Postop Assessment: no headache, no backache and epidural receding Anesthetic complications: no   No complications documented.  Last Vitals:  Vitals:   10/22/20 1143 10/22/20 1535  BP: 106/72 101/64  Pulse: 78 81  Resp: 17 18  Temp: 36.9 C 36.8 C  SpO2: 100%     Last Pain:  Vitals:   10/22/20 1739  TempSrc:   PainSc: 2    Pain Goal: Patients Stated Pain Goal: 3 (10/22/20 1739)                 Rica Records

## 2020-10-23 DIAGNOSIS — Z30017 Encounter for initial prescription of implantable subdermal contraceptive: Secondary | ICD-10-CM

## 2020-10-23 MED ORDER — IBUPROFEN 600 MG PO TABS: 600.0000 mg | ORAL_TABLET | Freq: Four times a day (QID) | ORAL | 0 refills | Status: DC

## 2020-10-23 MED ORDER — OLANZAPINE 5 MG PO TABS: 5.0000 mg | ORAL_TABLET | Freq: Every day | ORAL | 1 refills | Status: DC

## 2020-10-23 MED ORDER — LIDOCAINE HCL 1 % IJ SOLN
0.0000 mL | Freq: Once | INTRAMUSCULAR | Status: AC | PRN
  Administered 2020-10-23: 3 mL via INTRADERMAL
  Filled 2020-10-23: qty 20

## 2020-10-23 MED ORDER — WITCH HAZEL-GLYCERIN EX PADS: 1.0000 "application " | MEDICATED_PAD | CUTANEOUS | 12 refills | Status: DC | PRN

## 2020-10-23 MED ORDER — ONDANSETRON HCL 4 MG PO TABS: 4.0000 mg | ORAL_TABLET | ORAL | 0 refills | Status: DC | PRN

## 2020-10-23 MED ORDER — ACETAMINOPHEN 325 MG PO TABS: 650.0000 mg | ORAL_TABLET | ORAL | 0 refills | Status: DC | PRN

## 2020-10-23 MED ORDER — ETONOGESTREL 68 MG ~~LOC~~ IMPL
68.0000 mg | DRUG_IMPLANT | Freq: Once | SUBCUTANEOUS | Status: AC
  Administered 2020-10-23: 68 mg via SUBCUTANEOUS
  Filled 2020-10-23: qty 1

## 2020-10-23 NOTE — Lactation Note (Signed)
This note was copied from a baby's chart. Lactation Consultation Note  Patient Name: Girl [name] Lokey Today's Date: 10/23/2020 Reason for consult: Follow-up assessment Age:26 hours   P2 mother whose infant is now 27 hours old.  This is an ETI at 37+5 weeks.  Mother had no questions/concerns related to breast feeding.  She feels like her daughter is latching and feeding well.  Last LATCH score was a 9.  Baby is voiding/stooling well.  Engorgement prevention/treatment reviewed.  Offered a manual pump, however, mother declined.  She has our OP phone number for any further questions/concerns.  Father present.   Maternal Data    Feeding    LATCH Score                    Lactation Tools Discussed/Used    Interventions    Discharge Discharge Education: Engorgement and breast care  Consult Status Consult Status: Complete Date: 10/23/20 Follow-up type: Call as needed    Trigo Winterbottom R Shakyra Mattera 10/23/2020, 8:35 AM

## 2020-10-23 NOTE — Clinical Social Work Maternal (Signed)
CLINICAL SOCIAL WORK MATERNAL/[name] NOTE  Patient Details  Name: Leah [name] MRN: [id number] Date of Birth: 04/08/1996  Date:  10/23/2020  Clinical Social Worker Initiating Note:  Kathrin Greathouse, [place] Date/Time: Initiated:  10/23/20/0954     [name]'s Name:  Leah [name]   Biological Parents:  Mother,Father   Need for Interpreter:  None   Reason for Referral:  Behavioral Health Concerns   Address:  [address] [place] [address]    Phone number:  [phone] (home)     Additional phone number:   Household Members/Support Persons (HM/SP):   Household Member/Support Person 1   HM/SP Name Relationship DOB or Age  HM/SP -1 Leah [name] 6  HM/SP -2        HM/SP -3        HM/SP -4        HM/SP -5        HM/SP -6        HM/SP -7        HM/SP -8          Natural Supports (not living in the home):  Extended Family,Spouse/significant other   Professional Supports: Therapist   Employment: Full-time   Type of Work: Engineer, 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)   Education:  [employer] school graduate   Homebound arranged:    Museum/gallery curator Resources:  Medicaid   Other Resources:  Physicist, medical ,[place] Considerations Which May Impact Care:    Strengths:  Ability to meet basic needs ,Compliance with medical plan ,Home prepared for [name] ,Pediatrician chosen   Psychotropic Medications:         Pediatrician:    [facility] area  Pediatrician List:   Lavella Hammock, [place]      Pediatrician Fax Number:    Risk Factors/Current Problems:  Mental Health Concerns    Cognitive State:  Able to Concentrate ,Insightful ,Linear Thinking ,Alert    Mood/Affect:  Bright ,Calm ,Comfortable    CSW Assessment:  CSW received consult for hx of Bipolar, Depression and suicide attempts.  CSW met with MOB to offer support and complete assessment.    CSW  introduced role and congratulated MOB and the FOB. During the visit, CSW observed MOB sitting up in bed eating breakfast. MOB mother was holding and bonding with the infant. FOB at bedside as well. CSW offered MOB privacy during the assessment. MOB reports the FOB and her mother knew all about her mental health therefore was agreeable for her mother and the FOB Leah [name] 04-25-1997) to stay in the room.  CSW asked MOB how she has felt emotionally. MOB reports, " I am good." MOB presented bright, calm and receptive to [place] visit. CSW asked MOB about her pregnancy. MOB explains she had a rough pregnancy due to Hyperemesis and having to go to weekly infusions. MOB sighed relief.   CSW asked MOB about her supports. MOB identified the FOB; her mother as supports. CSW asked about her household. MOB reports she lives with her son Leah [name], age 58). MOB reports she is currently employed at [employer] and works from home. MOB reports she receives [facility] and food stamp services. MOB reports she will spend time this morning contacting [facility] and Medicaid office to inform them she had the baby.   CSW assessed the MOB for safety. MOB denies suicidal or homicidal thoughts.  CSW asked MOB about her mental health history. MOB reports she was diagnosed with bipolar and depression, diagnosis before 2011. MOB reports she is active with the [facility] center. She sees a psychiatrist for medication management. MOB reports she takes Zyprexa, 5 mg and has a prescription at the pharmacy she will pick up at discharge. MOB reports she temporarily stopped taking the medication during her pregnancy. MOB reports she resumed the medication yesterday after giving birth. MOB reports she has an office visit with a therapist at [place] every 2 weeks. MOB reports her next appointment is April 7th. CSW provided education regarding the baby blues period vs. perinatal mood disorders, discussed continuing  treatment at [facility].  MOB agreeable. CSW recommended MOB complete a self-evaluation during the postpartum time period using the New Mom Checklist from Postpartum Progress and encouraged MOB to contact a medical professional if symptoms are noted at any time. MOB receptive to the check list.   CSW provided review of Sudden Infant Death Syndrome (SIDS) precautions and informed MOB no co-sleeping with the infant. MOB reports understanding. CSW asked MOB where the infant will sleep and if she has items for the infant. MOB reports the infant will sleep in a bassinet and she has all items for the infant including a car seat. MOB chose pediatrician Dr. Karleen Dolphin MD.   CSW identifies no further need for intervention and no barriers to discharge at this time.    CSW Plan/Description:  Sudden Infant Death Syndrome (SIDS) Education,No Further Intervention Required/No Barriers to Discharge,Perinatal Mood and Anxiety Disorder (PMADs) Education    Lia Hopping, LCSW 10/23/2020, 9:59 AM

## 2020-10-23 NOTE — Procedures (Signed)
Post-Placental Nexplanon Insertion Procedure Note  Patient was identified. Informed consent was signed, signed copy in chart. A time-out was performed.    The insertion site was identified 8-10 cm (3-4 inches) from the medial epicondyle of the humerus and 3-5 cm (1.25-2 inches) posterior to (below) the sulcus (groove) between the biceps and triceps muscles of the patient's left arm and marked. The site was prepped and draped in the usual sterile fashion. Pt was prepped with alcohol swab and then injected with 3 cc of 2% lidocaine. The site was prepped with betadine. Nexplanon removed form packaging,  Device confirmed in needle, then inserted full length of needle and withdrawn per handbook instructions. Provider and patient verified presence of the implant in the woman's arm by palpation. Pt insertion site was covered with steristrips/adhesive bandage and pressure bandage. There was minimal blood loss. Patient tolerated procedure well.  Patient was given post procedure instructions and Nexplanon user card with expiration date. Condoms were recommended for STI prevention. Patient was asked to keep the pressure dressing on for 24 hours to minimize bruising and keep the adhesive bandage on for 3-5 days. The patient verbalized understanding of the plan of care and agrees.   Lot # [id number] Expiration Date 01/17/2023

## 2020-10-24 ENCOUNTER — Encounter: Payer: Self-pay | Admitting: Advanced Practice Midwife

## 2020-10-24 NOTE — BH Specialist Note (Signed)
Integrated Behavioral Health via Telemedicine Visit  10/24/2020 Leah [name] [id number]  Number of Integrated Behavioral Health visits: 2 Session Start time: 9:16  Session End time: 9:33 Total time: 17  Referring Provider: Lynnda Shields, MD Patient/Family location: Home [facility] Provider location: Center for [facility] Healthcare at [facility] for Women  All persons participating in visit: Patient Leah [name] and [facility] Evalena Fujii   Types of Service: Individual psychotherapy and Video visit  I connected with Leah M Velie and/or Leah M Stanard's n/a via  Telephone or [employer]  (Video is Surveyor, mining) and verified that I am speaking with the correct person using two identifiers. Discussed confidentiality: Yes   I discussed the limitations of telemedicine and the availability of in person appointments.  Discussed there is a possibility of technology failure and discussed alternative modes of communication if that failure occurs.  I discussed that engaging in this telemedicine visit, they consent to the provision of behavioral healthcare and the services will be billed under their insurance.  Patient and/or legal guardian expressed understanding and consented to Telemedicine visit: Yes   Presenting Concerns: Patient and/or family reports the following symptoms/concerns: Pt states her primary symptom is irritability, attributed specifically to people; pt's primary concern is interest in getting a tubal ligation prior to her postpartum visit, and prefers to do virtual postpartum visit instead of in-person. Pt resumed work from home one week postpartum, feels she is sleeping and eating well, is established with psychiatry for [facility] medication management and has no other questions or concerns.  Duration of problem: Postpartum; Severity of problem: mild  Patient and/or Family's Strengths/Protective Factors: Concrete supports in place (healthy food, safe  environments, etc.)  Goals Addressed: Patient will: 1.  Reduce symptoms of: irritability  2.  Demonstrate ability to: Increase healthy adjustment to current life circumstances  Progress towards Goals: Ongoing  Interventions: Interventions utilized:  Psychoeducation and/or Health Education Standardized Assessments completed: GAD-7 and PHQ 9  Patient and/or Family Response: Pt agrees to treatment plan  Assessment: Patient currently experiencing Bipolar 1 disorder (as previously diagnosed via psychiatry).   Patient may benefit from psychoeducation and brief therapeutic interventions regarding coping with symptom of irritability .  Plan: 1. Follow up with behavioral health clinician on : Call Asher Muir as needed at [phone] 2. Behavioral recommendations:  -Consider continue taking prenatal vitamin until postpartum visit -Continue plan to attend appointments at [facility] outpatient for [facility] medication management and ongoing therapy  -Consider continuing  with plan to attend postpartum visit to discuss tubal ligation; someone from clinical staff will call to answer any questions 3. Referral(s): Integrated [employer] (In Clinic)  I discussed the assessment and treatment plan with the patient and/or parent/guardian. They were provided an opportunity to ask questions and all were answered. They agreed with the plan and demonstrated an understanding of the instructions.   They were advised to call back or seek an in-person evaluation if the symptoms worsen or if the condition fails to improve as anticipated.  Valetta Close Ivionna Verley, LCSW

## 2020-10-31 ENCOUNTER — Ambulatory Visit (INDEPENDENT_AMBULATORY_CARE_PROVIDER_SITE_OTHER): Payer: Medicaid Other

## 2020-10-31 ENCOUNTER — Other Ambulatory Visit: Payer: Self-pay

## 2020-10-31 VITALS — BP 128/82 | HR 92 | Ht 60.0 in | Wt 145.1 lb

## 2020-10-31 DIAGNOSIS — R600 Localized edema: Secondary | ICD-10-CM

## 2020-10-31 NOTE — Progress Notes (Signed)
Pt here today for feet swelling post delivery x 1 week. Pt denies any headaches, visual changes.   BP today is 128/82, pulse 92  Pt has +1 non pitting edema in bilateral feet and +1 pitting edema on lower leg.   Assessed by Dr Crissie Reese. Pt advised to use compression socks before getting out of bed in the morning and staying active, increase fluids and void more often to remove extra fluid. Pt agreeable and verbalized understanding.   Pt has PP virtual appt scheduled for 5/2. Pt aware.   Judeth Cornfield, RN  10/31/20.

## 2020-10-31 NOTE — Progress Notes (Signed)
Chart reviewed for nurse visit. Agree with plan of care.   Venora Maples, MD 10/31/20 11:23 AM

## 2020-11-01 ENCOUNTER — Encounter: Payer: Self-pay | Admitting: Certified Nurse Midwife

## 2020-11-06 ENCOUNTER — Ambulatory Visit (INDEPENDENT_AMBULATORY_CARE_PROVIDER_SITE_OTHER): Payer: Medicaid Other | Admitting: Clinical

## 2020-11-06 DIAGNOSIS — F319 Bipolar disorder, unspecified: Secondary | ICD-10-CM | POA: Diagnosis not present

## 2020-11-06 DIAGNOSIS — O99345 Other mental disorders complicating the puerperium: Secondary | ICD-10-CM

## 2020-11-09 DIAGNOSIS — F319 Bipolar disorder, unspecified: Secondary | ICD-10-CM | POA: Diagnosis not present

## 2020-11-15 ENCOUNTER — Other Ambulatory Visit: Payer: Self-pay

## 2020-11-15 ENCOUNTER — Telehealth (INDEPENDENT_AMBULATORY_CARE_PROVIDER_SITE_OTHER): Payer: Medicaid Other | Admitting: Psychiatry

## 2020-11-15 ENCOUNTER — Encounter (HOSPITAL_COMMUNITY): Payer: Self-pay | Admitting: Psychiatry

## 2020-11-15 DIAGNOSIS — F319 Bipolar disorder, unspecified: Secondary | ICD-10-CM

## 2020-11-15 MED ORDER — OLANZAPINE 5 MG PO TABS: 5.0000 mg | ORAL_TABLET | Freq: Every day | ORAL | 2 refills | Status: DC

## 2020-11-15 NOTE — Progress Notes (Signed)
BH MD/PA/NP OP Progress Note Virtual Visit via Telephone Note  I connected with Leah [name] on 11/15/20 at  8:30 AM EDT by telephone and verified that I am speaking with the correct person using two identifiers.  Location: Patient: home Provider: Clinic   I discussed the limitations, risks, security and privacy concerns of performing an evaluation and management service by telephone and the availability of in person appointments. I also discussed with the patient that there may be a patient responsible charge related to this service. The patient expressed understanding and agreed to proceed.   I provided 30 minutes of non-face-to-face time during this encounter.   11/15/2020 8:39 AM Leah [name] [name]  MRN:  [id number]  Chief Complaint: " Can I breast-feed my child while on Zyprexa"  HPI: [age] year old female seen today for follow up psychiatric evaluation.  She has a psychiatric history of depression, bipolar disorder, and SI/SA.  She is currently managed on Zyprexa 5 mg.  She notes that her medications are effective in managing her psychiatric conditions.    Today patient was unable to logon virtually so her assessment was done over the phone.  During exam she was pleasant, cooperative, and engaged in conversation.  She informed Clinical research associatewriter that she has delivered her Leah [name] Leah [name] and they both are doing well.  She asked provider if she could breast-feed her child while on Zyprexa.  Provider informed patient that breast-feeding is not recommended while on antipsychotics because it may cause sedation, irritability, poor feeding, and EPS.  She notes that she was told by the clinicians in her delivery unit that it was okay.  Provider informed patient that it was her choice if she wanted to breast-feed her child. She was instructed her to have her child assessed by a pediatrician.  Provider also informed patient to inform the child's pediatrician that she was taking Zyprexa.  She notes that so far she has  not noticed any adverse reactions and her child.    Patient notes that since being restarted on medications her mood has been stable and denies symptoms of mania.  She also notes that her anxiety and depression has improved.  Today provider conducted a GAD-7 and patient scored a 7, at her last visit she scored a 7.  Provider also conducted a PHQ-9 and patient scored a 3, at her last visit she scored a 11.  Patient notes that she sleeps approximately 5 hours nightly.  She endorses an increased appetite.  She denies weight gain at this time.  Patient denies SI/HI/VAH or paranoia.    No medication changes made today.  Patient will continue all medications as prescribed. No other concerns noted at this time. Visit Diagnosis:    ICD-10-CM   1. Bipolar 1 disorder (HCC)  F31.9 OLANZapine (ZYPREXA) 5 MG tablet    Past Psychiatric History: depression, bipolar disorder, and SI/SA.  Past Medical History:  Past Medical History:  Diagnosis Date  . Anemia 2013  . Bipolar 1 disorder (HCC)   . Gall stones   . GERD (gastroesophageal reflux disease)   . 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 CHOLECYSTECTOMY N/A 09/28/2014   Procedure: LAPAROSCOPIC CHOLECYSTECTOMY;  Surgeon: Axel FillerArmando Ramirez, MD;  [facility];  Service: General;  Laterality: N/A;  . DIRECT LARYNGOSCOPY N/A 04/24/2014   Procedure: DIRECT LARYNGOSCOPY;  Surgeon: Darletta MollSui W Teoh, MD;  [facility];  Service: ENT;  Laterality: N/A;  . FOREIGN BODY  REMOVAL ESOPHAGEAL N/A 04/24/2014   Procedure: REMOVAL FOREIGN BODY ESOPHAGEAL;  Surgeon: [name], MD;  Location: [facility] OR;  Service: ENT;  Laterality: N/A;  . WISDOM TOOTH EXTRACTION  2013    Family Psychiatric History: Denies  Family History:  Family History  Problem Relation Age of Onset  . Arthritis Mother   . Bronchitis Mother   . Asthma Mother   . Hearing loss Paternal Grandfather   . Diabetes Neg Hx   . Stomach cancer Neg Hx   .  Colon cancer Neg Hx     Social History:  Social History   Socioeconomic History  . Marital status: Single    Spouse name: Not on file  . Number of children: 1  . Years of education: 47 th   . Highest education level: Not on file  Occupational History  . Occupation: Conservation officer, nature [employer]  Tobacco Use  . Smoking status: Never Smoker  . Smokeless tobacco: Never Used  . Tobacco comment: marjiuna  Vaping Use  . Vaping Use: Never used  Substance and Sexual Activity  . Alcohol use: Not Currently    Alcohol/week: 6.0 standard drinks    Types: 6 Cans of beer per week  . Drug use: Yes    Types: Marijuana    Comment: last used before pregnancy  . Sexual activity: Yes    Partners: Male    Birth control/protection: Patch  Other Topics Concern  . Not on file  Social History Narrative   ** Merged History Encounter **       Live with roommate and son.    Starting at [employer].    Social Determinants of Health   Financial Resource Strain: Low Risk   . Difficulty of Paying Living Expenses: Not hard at all  Food Insecurity: No Food Insecurity  . Worried About Programme researcher, broadcasting/film/video in the Last Year: Never true  . Ran Out of Food in the Last Year: Never true  Transportation Needs: No Transportation Needs  . Lack of Transportation (Medical): No  . Lack of Transportation (Non-Medical): No  Physical Activity: Inactive  . Days of Exercise per Week: 0 days  . Minutes of Exercise per Session: 0 min  Stress: No Stress Concern Present  . Feeling of Stress : Only a little  Social Connections: Socially Isolated  . Frequency of Communication with Friends and Family: Once a week  . Frequency of Social Gatherings with Friends and Family: Never  . Attends Religious Services: Never  . Active Member of Clubs or Organizations: No  . Attends Banker Meetings: Never  . Marital Status: Never married    Allergies:  Allergies  Allergen Reactions  . Bactrim  [Sulfamethoxazole-Trimethoprim] Rash    Metabolic Disorder Labs: Lab Results  Component Value Date   HGBA1C 5.6 04/11/2020   MPG 105.41 12/31/2018   MPG 103 08/22/2016   Lab Results  Component Value Date   PROLACTIN 18.9 10/01/2017   PROLACTIN 6.4 11/18/2014   Lab Results  Component Value Date   CHOL 159 12/31/2018   TRIG 31 12/31/2018   HDL 57 12/31/2018   CHOLHDL 2.8 12/31/2018   VLDL 6 12/31/2018   LDLCALC 96 12/31/2018   LDLCALC 82 08/22/2016   Lab Results  Component Value Date   TSH 0.940 12/31/2018   TSH 0.809 10/01/2017    Therapeutic Level Labs: No results found for: LITHIUM No results found for: VALPROATE No components found for:  CBMZ  Current Medications: Current  Outpatient Medications  Medication Sig Dispense Refill  . ibuprofen (ADVIL) 600 MG tablet Take 1 tablet (600 mg total) by mouth every 6 (six) hours. 30 tablet 0  . OLANZapine (ZYPREXA) 5 MG tablet Take 1 tablet (5 mg total) by mouth at bedtime. 30 tablet 2  . prenatal vitamin w/FE, FA (PRENATAL 1 + 1) 27-1 MG TABS tablet Take 1 tablet by mouth daily at 12 noon.     No current facility-administered medications for this visit.     Musculoskeletal: Strength & Muscle Tone: Unable to assess due to telephone visit Gait & Station: Unable to assess due to telephone visit Patient leans: N/A  Psychiatric Specialty Exam: Review of Systems  Last menstrual period 02/01/2020, currently breastfeeding.There is no height or weight on file to calculate BMI.  General Appearance: Unable to assess due to telephone visit  Eye Contact:  Unable to assess due to telephone visit  Speech:  Clear and Coherent and Normal Rate  Volume:  Normal  Mood:  Euthymic  Affect:  Unable to assess due to telephone visit  Thought Process:  Coherent, Goal Directed and Linear  Orientation:  Full (Time, Place, and Person)  Thought Content: WDL and Logical   Suicidal Thoughts:  No  Homicidal Thoughts:  No  Memory:  Immediate;    Good Recent;   Good Remote;   Good  Judgement:  Good  Insight:  Good  Psychomotor Activity:  Unable to assess due to telephone visit  Concentration:  Concentration: Good and Attention Span: Good  Recall:  Good  Fund of Knowledge: Good  Language: Good  Akathisia:  No  Handed:  Right  AIMS (if indicated): Not done  Assets:  Communication Skills Desire for Improvement Financial Resources/Insurance Housing Physical Health Social Support Vocational/Educational  ADL's:  Intact  Cognition: WNL  Sleep:  Fair   Screenings: AIMS   Flowsheet Row [facility]) [facility]  AIMS Total Score 0 0    AUDIT   Flowsheet Row Admission (Discharged) [facility] (Discharged) [facility]19/2016 in [facility] Admission (Discharged[facility]  Alcohol Use Disorder Identification Test Final Score (AUDIT) 1 1 8     GAD-7   Flowsheet Row Video Visit from 11/15/2020 in [facility] Integrated Behavioral Health from 11/06/2020 in Center for [facility] at [facility] for Women Routine Prenatal from 10/19/2020 in Center for 10/21/2020 Healthcare at [facility] for Women Routine Prenatal from 10/10/2020 in Center for 10/12/2020 Healthcare at [facility] for Women Video Visit from 09/01/2020 in Center for 10/30/2020 Healthcare at [facility] for Women  Total GAD-7 Score 7 3 3 3 5     PHQ2-9   Flowsheet Row Video Visit from 11/15/2020 in [facility]al Health from 11/06/2020 in Center for Women's Healthcare at [facility] for Women Routine Prenatal from 10/19/2020 in Center for [facility]  Healthcare at [facility] for Women Routine Prenatal from 10/10/2020 in Center for [facility] Healthcare at [facility] for Women Routine Prenatal from 09/27/2020 in Center for [facility] Healthcare at 11/27/2020 for Women  PHQ-2 Total Score 0 0 0 2 2  PHQ-9 Total Score 3 0 0 2 5    Flowsheet Row Video  Visit from 11/15/2020 in [facility] Admission (Discharged) from 10/21/2020 in [facility] 5S Mother Baby Unit Admission (Discharged) from 10/20/2020 in [facility] 1S Maternity Assessment Unit  C-SSRS RISK CATEGORY No Risk No Risk Low Risk       Assessment and Plan: Patient notes that she is doing well on her current medication regimen.No medication changes made today.  Patient will continue all medications as prescribed.   1. Bipolar 1 disorder (HCC)  Continue- OLANZapine (ZYPREXA) 5 MG tablet; Take 1 tablet (5 mg total) by mouth at bedtime.  Dispense: 30 tablet; Refill: 2  Follow-up in 3 months  Shanna Cisco, NP 11/15/2020, 8:39 AM

## 2020-11-20 ENCOUNTER — Telehealth (INDEPENDENT_AMBULATORY_CARE_PROVIDER_SITE_OTHER): Payer: Medicaid Other | Admitting: Obstetrics and Gynecology

## 2020-11-20 ENCOUNTER — Telehealth: Payer: Medicaid Other | Admitting: Student

## 2020-11-20 NOTE — Progress Notes (Signed)
I connected with  [name] on 11/20/20 at 10:35 AM EDT by telephone and verified that I am speaking with the correct person using two identifiers.   I discussed the limitations, risks, security and privacy concerns of performing an evaluation and management service by telephone and the availability of in person appointments. I also discussed with the patient that there may be a patient responsible charge related to this service. The patient expressed understanding and agreed to proceed.  Pt at work Charity fundraiser at [employer]   Time spent on call: 10 minutes  Pt states signed consent for PP tubal.   Warden Fillers, MD 11/20/2020  11:56 AM     Provider location: Center for [facility]   Patient location: Home  I connected with@ on 11/20/20 at 10:35 AM EDT by Mychart Video Encounter and verified that I am speaking with the correct person using two identifiers.       I discussed the limitations, risks, security and privacy concerns of performing an evaluation and management service virtually and the availability of in person appointments. I also discussed with the patient that there may be a patient responsible charge related to this service. The patient expressed understanding and agreed to proceed.  Post Partum Visit Note Subjective:   Leah [name] is a 25 y.o. G64P2002 female who presents for a postpartum visit. She is 4 weeks postpartum following a normal spontaneous vaginal delivery.  I have fully reviewed the prenatal and intrapartum course. The delivery was at 37.5 gestational weeks.  Anesthesia: epidural. Postpartum course has been uncomplicated. Baby is doing well. Baby is feeding by breast. Bleeding no bleeding. Bowel function is normal. Bladder function is normal. Patient is sexually active. Contraception method is Nexplanon. Postpartum depression screening: negative.  Pt did note unprotected sexual intercourse within 6-10 days of delivery and  nexplanon placement.  Pt advised this is not optimal use, but she thought the nexplanon was immediately effective.   The pregnancy intention screening data noted above was reviewed. Potential methods of contraception were discussed. The patient elected to proceed with Hormonal Implant.    Edinburgh Postnatal Depression Scale - 11/20/20 1057      Edinburgh Postnatal Depression Scale:  In the Past 7 Days   I have been able to laugh and see the funny side of things. 0    I have looked forward with enjoyment to things. 0    I have blamed myself unnecessarily when things went wrong. 0    I have been anxious or worried for no good reason. 0    I have felt scared or panicky for no good reason. 0    Things have been getting on top of me. 0    I have been so unhappy that I have had difficulty sleeping. 0    I have felt sad or miserable. 0    I have been so unhappy that I have been crying. 0    The thought of harming myself has occurred to me. 0    Edinburgh Postnatal Depression Scale Total 0           The following portions of the patient's history were reviewed and updated as appropriate: allergies, current medications, past family history, past medical history, past social history, past surgical history and problem list.  Review of Systems Pertinent items are noted in HPI.  Objective:  LMP 02/01/2020 (Exact Date)     General:  Alert, oriented and  cooperative. Patient is in no acute distress.  Respiratory: Normal respiratory effort, no problems with respiration noted  Mental Status: Normal mood and affect. Normal behavior. Normal judgment and thought content.  Rest of physical exam deferred due to type of encounter   Assessment:    normal virtual postpartum exam.  Plan:  Essential components of care per ACOG recommendations:  1.  Mood and well being: Patient with negative depression screening today. Reviewed local resources for support.  - Patient does not use tobacco.  - hx of  drug use? No   2. Infant care and feeding:  -Patient currently breastmilk feeding? Yes If breastmilk feeding discussed return to work and pumping. If needed, patient was provided letter for work to allow for every 2-3 hr pumping breaks, and to be granted a private location to express breastmilk and refrigerated area to store breastmilk. Reviewed importance of draining breast regularly to support lactation. -Social determinants of health (SDOH) reviewed in EPIC. The following needs were identified: lack of physical activity and social isolation, pt has returned to work  3. Sexuality, contraception and birth spacing - Patient does not want a pregnancy in the next year.  Desired family size is 2 children.  - Reviewed forms of contraception in tiered fashion. Patient desired nexplanon today.  Discussed BTL, but pt is somewhat unsure.  Advised pt to reassess for BTL in 3 years when nexplanon may need to be replaced - Discussed birth spacing of 18 months  4. Sleep and fatigue -Encouraged family/partner/community support of 4 hrs of uninterrupted sleep to help with mood and fatigue  5. Physical Recovery  - Discussed patients delivery and complications - Patient had a no lacerations, perineal healing reviewed. Patient expressed understanding - Patient has urinary incontinence? No  - Patient is safe to resume physical and sexual activity  6.  Health Maintenance - Last pap smear done 2019 and was normal with negative HPV. Pt should return in 2 months for AE/pap  I provided 10 minutes of face-to-face time during this encounter.    Return in about 2 months (around 01/20/2021) for Annual.  Future Appointments  Date Time Provider Department Center  11/30/2020  8:30 AM [facility] NURSE [facility]  02/14/2021  8:30 AM Shanna Cisco, NP [facility] None    Warden Fillers, MD Center for [facility], [facility]

## 2020-11-23 DIAGNOSIS — F319 Bipolar disorder, unspecified: Secondary | ICD-10-CM | POA: Diagnosis not present

## 2020-11-30 ENCOUNTER — Other Ambulatory Visit (HOSPITAL_COMMUNITY)
Admission: RE | Admit: 2020-11-30 | Discharge: 2020-11-30 | Disposition: A | Discharge status: Home / Self Care | Payer: Medicaid Other | Source: Ambulatory Visit | Attending: Family Medicine | Admitting: Family Medicine

## 2020-11-30 ENCOUNTER — Ambulatory Visit (INDEPENDENT_AMBULATORY_CARE_PROVIDER_SITE_OTHER): Payer: Medicaid Other

## 2020-11-30 ENCOUNTER — Other Ambulatory Visit: Payer: Self-pay

## 2020-11-30 VITALS — BP 113/80 | HR 83 | Wt 148.1 lb

## 2020-11-30 DIAGNOSIS — N898 Other specified noninflammatory disorders of vagina: Secondary | ICD-10-CM | POA: Diagnosis not present

## 2020-11-30 NOTE — Progress Notes (Signed)
Pt here today for self swab. Pt states possibly having BV again. Pt states having vaginal odor only, no discharge, no STD exposure. Has been using Dove sensitive soap, but not partner. Advised partner to use the same soap on genital area to decrease chance of pH balance being off. Pt agreeable and verbalized understanding.   Self swab collected today. Pt advised results will take 24-48 hours and will see results in mychart and will be notified if needs further treatment. Pt states prefers Metrogel if needs treatment.   Judeth Cornfield, RN  11/30/20.

## 2020-11-30 NOTE — Progress Notes (Signed)
Patient was assessed and managed by nursing staff during this encounter. I have reviewed the chart and agree with the documentation and plan.   Bernerd Limbo, CNM 11/30/2020 10:56 PM

## 2020-12-01 ENCOUNTER — Other Ambulatory Visit: Payer: Self-pay | Admitting: Certified Nurse Midwife

## 2020-12-01 DIAGNOSIS — B9689 Other specified bacterial agents as the cause of diseases classified elsewhere: Secondary | ICD-10-CM

## 2020-12-01 LAB — CERVICOVAGINAL ANCILLARY ONLY
Bacterial Vaginitis (gardnerella): POSITIVE — AB
Candida Glabrata: NEGATIVE
Candida Vaginitis: NEGATIVE
Chlamydia: NEGATIVE
Comment: NEGATIVE
Comment: NEGATIVE
Comment: NEGATIVE
Comment: NEGATIVE
Comment: NEGATIVE
Comment: NORMAL
Neisseria Gonorrhea: NEGATIVE
Trichomonas: NEGATIVE

## 2020-12-01 MED ORDER — METRONIDAZOLE 0.75 % VA GEL: 1.0000 | Freq: Two times a day (BID) | VAGINAL | 0 refills | Status: DC

## 2020-12-04 ENCOUNTER — Encounter: Payer: Self-pay | Admitting: Family Medicine

## 2020-12-20 DIAGNOSIS — Z20822 Contact with and (suspected) exposure to covid-19: Secondary | ICD-10-CM | POA: Diagnosis not present

## 2020-12-21 DIAGNOSIS — F319 Bipolar disorder, unspecified: Secondary | ICD-10-CM | POA: Diagnosis not present

## 2020-12-26 DIAGNOSIS — Z1152 Encounter for screening for COVID-19: Secondary | ICD-10-CM | POA: Diagnosis not present

## 2020-12-28 DIAGNOSIS — B9689 Other specified bacterial agents as the cause of diseases classified elsewhere: Secondary | ICD-10-CM

## 2020-12-28 MED ORDER — METRONIDAZOLE 0.75 % VA GEL
1.0000 | Freq: Two times a day (BID) | VAGINAL | 0 refills | Status: AC
Start: 2020-12-28 — End: 2021-01-02

## 2021-01-01 ENCOUNTER — Encounter: Payer: Self-pay | Admitting: *Deleted

## 2021-01-30 ENCOUNTER — Encounter (HOSPITAL_COMMUNITY): Payer: Self-pay

## 2021-01-30 ENCOUNTER — Emergency Department (HOSPITAL_COMMUNITY)
Admission: EM | Admit: 2021-01-30 | Discharge: 2021-01-30 | Disposition: A | Discharge status: Home / Self Care | Payer: Medicaid Other | Attending: Emergency Medicine | Admitting: Emergency Medicine

## 2021-01-30 ENCOUNTER — Other Ambulatory Visit: Payer: Self-pay

## 2021-01-30 DIAGNOSIS — B9789 Other viral agents as the cause of diseases classified elsewhere: Secondary | ICD-10-CM | POA: Diagnosis not present

## 2021-01-30 DIAGNOSIS — U071 COVID-19: Secondary | ICD-10-CM | POA: Diagnosis not present

## 2021-01-30 DIAGNOSIS — R059 Cough, unspecified: Secondary | ICD-10-CM

## 2021-01-30 DIAGNOSIS — J029 Acute pharyngitis, unspecified: Secondary | ICD-10-CM

## 2021-01-30 DIAGNOSIS — J069 Acute upper respiratory infection, unspecified: Secondary | ICD-10-CM | POA: Diagnosis not present

## 2021-01-30 LAB — RESP PANEL BY RT-PCR (FLU A&B, COVID) ARPGX2
Influenza A by PCR: NEGATIVE
Influenza B by PCR: NEGATIVE
SARS Coronavirus 2 by RT PCR: POSITIVE — AB

## 2021-01-30 MED ORDER — DEXAMETHASONE 4 MG PO TABS
10.0000 mg | ORAL_TABLET | Freq: Once | ORAL | Status: AC
  Administered 2021-01-30: 10 mg via ORAL
  Filled 2021-01-30: qty 2

## 2021-01-30 MED ORDER — GUAIFENESIN 100 MG/5ML PO SYRP: 100.0000 mg | ORAL_SOLUTION | ORAL | 0 refills | Status: DC | PRN

## 2021-01-30 MED ORDER — AMOXICILLIN 500 MG PO CAPS: 1000.0000 mg | ORAL_CAPSULE | Freq: Every day | ORAL | 0 refills | Status: AC

## 2021-01-30 NOTE — ED Provider Notes (Signed)
[facility]-EMERGENCY DEPT Provider Note   CSN: [id number] Arrival date & time: 01/30/21  1028     History Chief Complaint  Patient presents with   Cough   Sore Throat   Generalized Body Aches    Leah [name] is a 25 y.o. female.  The history is provided by the patient.  Cough Cough characteristics:  Non-productive Sputum characteristics:  Nondescript Severity:  Moderate Timing:  Intermittent Progression:  Unchanged Chronicity:  New Context: upper respiratory infection   Relieved by:  Nothing Worsened by:  Nothing Associated symptoms: sinus congestion and sore throat   Associated symptoms: no chest pain, no chills, no diaphoresis, no ear fullness, no ear pain, no eye discharge, no fever, no headaches and no myalgias   Sore Throat Pertinent negatives include no chest pain and no headaches.      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   Suicide attempt by drug ingestion (HCC)    Anemia, iron deficiency 12/01/2012    Past Surgical History:  Procedure Laterality Date   CHOLECYSTECTOMY N/A 09/28/2014   Procedure: LAPAROSCOPIC CHOLECYSTECTOMY;  Surgeon: Axel Filler, MD;  Location: [facility] OR;  Service: General;  Laterality: N/A;   DIRECT LARYNGOSCOPY N/A 04/24/2014   Procedure: DIRECT LARYNGOSCOPY;  Surgeon: [name], MD;  [facility];  Service: ENT;  Laterality: N/A;   FOREIGN BODY REMOVAL ESOPHAGEAL N/A 04/24/2014   Procedure: REMOVAL FOREIGN BODY ESOPHAGEAL;  Surgeon: [name], MD;  Location: [facility] OR;  Service: ENT;  Laterality: N/A;   WISDOM TOOTH EXTRACTION  2013     OB History      Gravida  2   Para  2   Term  2   Preterm  0   AB  0   Living  2      SAB  0   IAB  0   Ectopic  0   Multiple  0   Live Births  2           Family History  Problem Relation Age of Onset   Arthritis Mother    Bronchitis Mother    Asthma Mother    Hearing loss Paternal Grandfather    Diabetes Neg Hx    Stomach cancer Neg Hx    Colon cancer Neg Hx     Social History   Tobacco Use   Smoking status: Never   Smokeless tobacco: Never   Tobacco comments:    marjiuna  Vaping Use   Vaping Use: Never used  Substance Use Topics   Alcohol use: Not Currently    Alcohol/week: 6.0 standard drinks    Types: 6 Cans of beer per week   Drug use: Not Currently    Types: Marijuana    Comment: last used before pregnancy    Home Medications Prior to Admission medications   Medication Sig Start Date End Date Taking? Authorizing Provider  amoxicillin (AMOXIL) 500 MG capsule Take 2 capsules (1,000 mg total) by mouth daily for 10 days. 01/30/21 02/09/21 Yes Mikle Sternberg, DO  guaifenesin (ROBITUSSIN) 100 MG/5ML syrup Take 5-10 mLs (100-200 mg  total) by mouth every 4 (four) hours as needed for cough. 01/30/21  Yes Milas Schappell, DO  ibuprofen (ADVIL) 600 MG tablet Take 1 tablet (600 mg total) by mouth every 6 (six) hours. 10/23/20   Marylene Land, CNM  OLANZapine (ZYPREXA) 5 MG tablet Take 1 tablet (5 mg total) by mouth at bedtime. 11/15/20   Shanna Cisco, NP  prenatal vitamin w/FE, FA (PRENATAL 1 + 1) 27-1 MG TABS tablet Take 1 tablet by mouth daily at 12 noon.    [provider]    Allergies    Bactrim [sulfamethoxazole-trimethoprim]  Review of Systems   Review of Systems  Constitutional:  Negative for chills, diaphoresis and fever.  HENT:  Positive for sore throat. Negative for ear pain.   Eyes:  Negative for discharge.  Respiratory:  Positive for cough.   Cardiovascular:  Negative for chest pain.  Musculoskeletal:  Negative for myalgias.   Neurological:  Negative for headaches.   Physical Exam Updated Vital Signs BP (!) 141/81 (BP Location: Left Arm)   Pulse (!) 110   Temp 98.2 F (36.8 C) (Oral)   Resp 18   Ht 5' (1.524 m)   Wt 65.8 kg   SpO2 97%   Breastfeeding No   BMI 28.32 kg/m   Physical Exam Constitutional:      General: She is not in acute distress.    Appearance: She is not ill-appearing.  HENT:     Head: Atraumatic.     Nose: Congestion present.     Mouth/Throat:     Mouth: No oral lesions.     Pharynx: Posterior oropharyngeal erythema present. No pharyngeal swelling, oropharyngeal exudate or uvula swelling.     Tonsils: No tonsillar exudate.  Eyes:     Conjunctiva/sclera: Conjunctivae normal.  Cardiovascular:     Heart sounds: Normal heart sounds.  Musculoskeletal:     Cervical back: Normal range of motion.  Skin:    Capillary Refill: Capillary refill takes less than 2 seconds.  Neurological:     Mental Status: She is alert.    ED Results / Procedures / Treatments   Labs (all labs ordered are listed, but only abnormal results are displayed) Labs Reviewed  RESP PANEL BY RT-PCR (FLU A&B, COVID) [id number]    EKG None  Radiology No results found.  Procedures Procedures   Medications Ordered in ED Medications  dexamethasone (DECADRON) tablet 10 mg (has no administration in time range)    ED Course  I have reviewed the triage vital signs and the nursing notes.  Pertinent labs & imaging results that were available during my care of the patient were reviewed by me and considered in my medical decision making (see chart for details).    MDM Rules/Calculators/A&P                          Leah [name] is here with URI symptoms.  Normal vitals.  No fever.  Congestion, cough that is nonproductive, sore throat.  Throat exam is overall unremarkable except for some erythema.  Overall suspect viral process.  She has no concern for pregnancy as she just recently gave birth.  We will give a  dose of Decadron to help with cough and sore throat.  Will test for COVID and flu.  Will prescribe antibiotic if viral testing is negative.  Discharged in good condition.  Understands return precautions.  This chart was dictated using voice recognition software.  Despite  best efforts to proofread,  errors can occur which can change the documentation meaning.   Final Clinical Impression(s) / ED Diagnoses Final diagnoses:  Cough  Sore throat  Viral upper respiratory tract infection    Rx / DC Orders ED Discharge Orders          Ordered    guaifenesin (ROBITUSSIN) 100 MG/5ML syrup  Every 4 hours PRN        01/30/21 1131    amoxicillin (AMOXIL) 500 MG capsule  Daily        01/30/21 1131             Gambell, Madelaine Bhat, DO 01/30/21 1135

## 2021-01-30 NOTE — Discharge Instructions (Addendum)
Follow-up your COVID and flu test online on your MyChart.  If negative get your antibiotic filled.  Recommend tea and honey for cough as well as Robitussin.  Decadron should also help with your cough as well which you are already given today.

## 2021-01-30 NOTE — ED Triage Notes (Signed)
Patient c/o cough, body aches, and a sore throat since last night.

## 2021-02-02 ENCOUNTER — Emergency Department (HOSPITAL_COMMUNITY): Payer: Medicaid Other

## 2021-02-02 ENCOUNTER — Emergency Department (HOSPITAL_COMMUNITY)
Admission: EM | Admit: 2021-02-02 | Discharge: 2021-02-02 | Disposition: A | Discharge status: Home / Self Care | Payer: Medicaid Other | Attending: Emergency Medicine | Admitting: Emergency Medicine

## 2021-02-02 ENCOUNTER — Other Ambulatory Visit: Payer: Self-pay

## 2021-02-02 ENCOUNTER — Encounter (HOSPITAL_COMMUNITY): Payer: Self-pay

## 2021-02-02 DIAGNOSIS — E860 Dehydration: Secondary | ICD-10-CM | POA: Diagnosis not present

## 2021-02-02 DIAGNOSIS — R5383 Other fatigue: Secondary | ICD-10-CM | POA: Diagnosis present

## 2021-02-02 DIAGNOSIS — R638 Other symptoms and signs concerning food and fluid intake: Secondary | ICD-10-CM | POA: Insufficient documentation

## 2021-02-02 DIAGNOSIS — U071 COVID-19: Secondary | ICD-10-CM | POA: Diagnosis not present

## 2021-02-02 DIAGNOSIS — R0602 Shortness of breath: Secondary | ICD-10-CM | POA: Diagnosis not present

## 2021-02-02 LAB — CBC WITH DIFFERENTIAL/PLATELET
Abs Immature Granulocytes: 0.01 10*3/uL (ref 0.00–0.07)
Basophils Absolute: 0 10*3/uL (ref 0.0–0.1)
Basophils Relative: 0 %
Eosinophils Absolute: 0.2 10*3/uL (ref 0.0–0.5)
Eosinophils Relative: 3 %
HCT: 40.2 % (ref 36.0–46.0)
Hemoglobin: 13 g/dL (ref 12.0–15.0)
Immature Granulocytes: 0 %
Lymphocytes Relative: 36 %
Lymphs Abs: 1.9 10*3/uL (ref 0.7–4.0)
MCH: 28.7 pg (ref 26.0–34.0)
MCHC: 32.3 g/dL (ref 30.0–36.0)
MCV: 88.7 fL (ref 80.0–100.0)
Monocytes Absolute: 0.7 10*3/uL (ref 0.1–1.0)
Monocytes Relative: 14 %
Neutro Abs: 2.5 10*3/uL (ref 1.7–7.7)
Neutrophils Relative %: 47 %
Platelets: 291 10*3/uL (ref 150–400)
RBC: 4.53 MIL/uL (ref 3.87–5.11)
RDW: 14.6 % (ref 11.5–15.5)
WBC: 5.3 10*3/uL (ref 4.0–10.5)
nRBC: 0 % (ref 0.0–0.2)

## 2021-02-02 LAB — BASIC METABOLIC PANEL
Anion gap: 6 (ref 5–15)
BUN: 11 mg/dL (ref 6–20)
CO2: 26 mmol/L (ref 22–32)
Calcium: 8.9 mg/dL (ref 8.9–10.3)
Chloride: 105 mmol/L (ref 98–111)
Creatinine, Ser: 0.7 mg/dL (ref 0.44–1.00)
GFR, Estimated: >60 mL/min (ref >60)
Glucose, Bld: 92 mg/dL (ref 70–99)
Potassium: 3.7 mmol/L (ref 3.5–5.1)
Sodium: 137 mmol/L (ref 135–145)

## 2021-02-02 MED ORDER — LACTATED RINGERS IV BOLUS
1000.0000 mL | Freq: Once | INTRAVENOUS | Status: AC
  Administered 2021-02-02: 1000 mL via INTRAVENOUS

## 2021-02-02 MED ORDER — KETOROLAC TROMETHAMINE 30 MG/ML IJ SOLN
30.0000 mg | Freq: Once | INTRAMUSCULAR | Status: AC
  Administered 2021-02-02: 30 mg via INTRAVENOUS
  Filled 2021-02-02: qty 1

## 2021-02-02 MED ORDER — LACTATED RINGERS IV SOLN: INTRAVENOUS | Status: DC

## 2021-02-02 NOTE — ED Triage Notes (Signed)
Pt reports testing positive for COVID on 7/12. Pt reports symptoms are becoming worse including cough and fatigue. She reports feeling lightheaded at times.

## 2021-02-02 NOTE — ED Provider Notes (Signed)
[facility]-EMERG[facility] DEPT Provider Note   CSN: [id number] Arrival date & time: 02/02/21  1221     History Chief Complaint  Patient presents with   Cough   Fatigue    Leah [name] is a 25 y.o. female.  25 year old female diagnosed with COVID 3 days ago presents with weakness and fatigue.  States she had decreased oral intake.  Has had emesis but no abdominal discomfort.  Some diarrhea.  Fever at home and states she passed out.  Denies being short of breath.  Cough is only mild.  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   Suicide attempt by drug ingestion (HCC)    Anemia, iron deficiency 12/01/2012    Past Surgical History:  Procedure Laterality Date   CHOLECYSTECTOMY N/A 09/28/2014   Procedure: LAPAROSCOPIC CHOLECYSTECTOMY;  Surgeon: Axel Filler, MD;  Location: [facility] OR;  Service: General;  Laterality: N/A;   DIRECT LARYNGOSCOPY N/A 04/24/2014   Procedure: DIRECT LARYNGOSCOPY;  Surgeon: [name], MD;  Location: [facility] OR;  Service: ENT;  Laterality: N/A;   FOREIGN BODY REMOVAL ESOPHAGEAL N/A 04/24/2014   Procedure: REMOVAL FOREIGN BODY ESOPHAGEAL;  Surgeon: [name], MD;  Location: [facility] OR;  Service: ENT;  Laterality: N/A;   WISDOM TOOTH EXTRACTION  2013     OB History     Gravida  2   Para  2   Term  2   Preterm  0   AB  0   Living  2      SAB  0   IAB  0   Ectopic  0   Multiple  0   Live Births  2           Family History  Problem Relation Age of Onset   Arthritis Mother    Bronchitis Mother    Asthma Mother    Hearing  loss Paternal Grandfather    Diabetes Neg Hx    Stomach cancer Neg Hx    Colon cancer Neg Hx     Social History   Tobacco Use   Smoking status: Never   Smokeless tobacco: Never   Tobacco comments:    marjiuna  Vaping Use   Vaping Use: Never used  Substance Use Topics   Alcohol use: Not Currently    Alcohol/week: 6.0 standard drinks    Types: 6 Cans of beer per week   Drug use: Not Currently    Types: Marijuana    Comment: last used before pregnancy    Home Medications Prior to Admission medications   Medication Sig Start Date End Date Taking? Authorizing Provider  amoxicillin (AMOXIL) 500 MG capsule Take 2 capsules (1,000 mg total) by mouth daily for 10 days. 01/30/21 02/09/21  Curatolo, Adam, DO  guaifenesin (ROBITUSSIN) 100 MG/5ML syrup Take 5-10 mLs (100-200 mg total) by mouth every 4 (four) hours as needed for cough. 01/30/21   Curatolo, Adam, DO  ibuprofen (ADVIL) 600 MG tablet Take 1 tablet (600 mg total) by mouth every 6 (six) hours. 10/23/20  Marylene Land, CNM  OLANZapine (ZYPREXA) 5 MG tablet Take 1 tablet (5 mg total) by mouth at bedtime. 11/15/20   Shanna Cisco, NP  prenatal vitamin w/FE, FA (PRENATAL 1 + 1) 27-1 MG TABS tablet Take 1 tablet by mouth daily at 12 noon.    [provider]    Allergies    Bactrim [sulfamethoxazole-trimethoprim]  Review of Systems   Review of Systems  All other systems reviewed and are negative.  Physical Exam Updated Vital Signs BP 117/77 (BP Location: Left Arm)   Pulse 100   Temp 98.7 F (37.1 C) (Oral)   Resp 18   SpO2 100%   Physical Exam Vitals and nursing note reviewed.  Constitutional:      General: She is not in acute distress.    Appearance: Normal appearance. She is well-developed. She is not toxic-appearing.  HENT:     Head: Normocephalic and atraumatic.  Eyes:     General: Lids are normal.     Conjunctiva/sclera: Conjunctivae normal.     Pupils: Pupils are equal, round, and  reactive to light.  Neck:     Thyroid: No thyroid mass.     Trachea: No tracheal deviation.  Cardiovascular:     Rate and Rhythm: Normal rate and regular rhythm.     Heart sounds: Normal heart sounds. No murmur heard.   No gallop.  Pulmonary:     Effort: Pulmonary effort is normal. No respiratory distress.     Breath sounds: Normal breath sounds. No stridor. No decreased breath sounds, wheezing, rhonchi or rales.  Abdominal:     General: There is no distension.     Palpations: Abdomen is soft.     Tenderness: There is no abdominal tenderness. There is no rebound.  Musculoskeletal:        General: No tenderness. Normal range of motion.     Cervical back: Normal range of motion and neck supple.  Skin:    General: Skin is warm and dry.     Findings: No abrasion or rash.  Neurological:     Mental Status: She is alert and oriented to person, place, and time. Mental status is at baseline.     GCS: GCS eye subscore is 4. GCS verbal subscore is 5. GCS motor subscore is 6.     Cranial Nerves: Cranial nerves are intact. No cranial nerve deficit.     Sensory: No sensory deficit.     Motor: Motor function is intact.  Psychiatric:        Attention and Perception: Attention normal.        Speech: Speech normal.        Behavior: Behavior normal.    ED Results / Procedures / Treatments   Labs (all labs ordered are listed, but only abnormal results are displayed) Labs Reviewed  CBC WITH DIFFERENTIAL/PLATELET  BASIC METABOLIC PANEL    EKG EKG Interpretation  Date/Time:  Friday February 02 2021 13:40:27 EDT Ventricular Rate:  74 PR Interval:  158 QRS Duration: 90 QT Interval:  358 QTC Calculation: 398 R Axis:   38 Text Interpretation: Sinus rhythm Low voltage, precordial leads No significant change since last tracing Confirmed by Lorre Nick ([id number]) on 02/02/2021 1:52:25 PM  Radiology No results found.  Procedures Procedures   Medications Ordered in ED Medications  lactated  ringers bolus 1,000 mL (has no administration in time range)  lactated ringers infusion (has no administration in time range)    ED Course  I  have reviewed the triage vital signs and the nursing notes.  Pertinent labs & imaging results that were available during my care of the patient were reviewed by me and considered in my medical decision making (see chart for details).    MDM Rules/Calculators/A&P                           Final Clinical Impression(s) / ED Diagnoses Final diagnoses:  None   Patient given IV fluids here.  Electrolytes CBC and chest x-ray without acute abnormalities.  Will discharge home Rx / DC Orders ED Discharge Orders     None        Lorre Nick, MD 02/02/21 1448

## 2021-02-14 ENCOUNTER — Telehealth (HOSPITAL_COMMUNITY): Payer: Medicaid Other | Admitting: Psychiatry

## 2021-02-14 ENCOUNTER — Other Ambulatory Visit: Payer: Self-pay

## 2021-02-21 DIAGNOSIS — F319 Bipolar disorder, unspecified: Secondary | ICD-10-CM | POA: Diagnosis not present

## 2021-03-06 MED ORDER — METRONIDAZOLE 0.75 % VA GEL: VAGINAL | 6 refills | Status: DC

## 2021-03-08 ENCOUNTER — Emergency Department (HOSPITAL_COMMUNITY)
Admission: EM | Admit: 2021-03-08 | Discharge: 2021-03-08 | Disposition: A | Discharge status: Home / Self Care | Payer: Medicaid Other | Attending: Emergency Medicine | Admitting: Emergency Medicine

## 2021-03-08 ENCOUNTER — Encounter (HOSPITAL_COMMUNITY): Payer: Self-pay | Admitting: Emergency Medicine

## 2021-03-08 ENCOUNTER — Other Ambulatory Visit: Payer: Self-pay

## 2021-03-08 DIAGNOSIS — J029 Acute pharyngitis, unspecified: Secondary | ICD-10-CM | POA: Diagnosis not present

## 2021-03-08 DIAGNOSIS — Z8616 Personal history of COVID-19: Secondary | ICD-10-CM | POA: Diagnosis not present

## 2021-03-08 DIAGNOSIS — R059 Cough, unspecified: Secondary | ICD-10-CM | POA: Diagnosis not present

## 2021-03-08 DIAGNOSIS — G43909 Migraine, unspecified, not intractable, without status migrainosus: Secondary | ICD-10-CM | POA: Diagnosis not present

## 2021-03-08 DIAGNOSIS — Z20822 Contact with and (suspected) exposure to covid-19: Secondary | ICD-10-CM | POA: Insufficient documentation

## 2021-03-08 DIAGNOSIS — R509 Fever, unspecified: Secondary | ICD-10-CM | POA: Insufficient documentation

## 2021-03-08 LAB — RESP PANEL BY RT-PCR (FLU A&B, COVID) ARPGX2
Influenza A by PCR: NEGATIVE
Influenza B by PCR: NEGATIVE
SARS Coronavirus 2 by RT PCR: NEGATIVE

## 2021-03-08 MED ORDER — BENZONATATE 100 MG PO CAPS: 100.0000 mg | ORAL_CAPSULE | Freq: Three times a day (TID) | ORAL | 0 refills | Status: DC

## 2021-03-08 MED ORDER — ONDANSETRON 4 MG PO TBDP: 4.0000 mg | ORAL_TABLET | Freq: Three times a day (TID) | ORAL | 0 refills | Status: DC | PRN

## 2021-03-08 MED ORDER — DIPHENHYDRAMINE HCL 25 MG PO CAPS
25.0000 mg | ORAL_CAPSULE | Freq: Once | ORAL | Status: AC
  Administered 2021-03-08: 25 mg via ORAL
  Filled 2021-03-08: qty 1

## 2021-03-08 MED ORDER — PROCHLORPERAZINE EDISYLATE 10 MG/2ML IJ SOLN
10.0000 mg | Freq: Once | INTRAMUSCULAR | Status: AC
  Administered 2021-03-08: 10 mg via INTRAMUSCULAR
  Filled 2021-03-08: qty 2

## 2021-03-08 MED ORDER — ACETAMINOPHEN ER 650 MG PO TBCR: 650.0000 mg | EXTENDED_RELEASE_TABLET | Freq: Three times a day (TID) | ORAL | 0 refills | Status: DC | PRN

## 2021-03-08 MED ORDER — ACETAMINOPHEN 325 MG PO TABS
650.0000 mg | ORAL_TABLET | Freq: Once | ORAL | Status: AC
  Administered 2021-03-08: 650 mg via ORAL
  Filled 2021-03-08: qty 2

## 2021-03-08 NOTE — ED Notes (Signed)
Pt discharged from this ED in stable condition at this time. All discharge instructions and follow up care reviewed with pt with no further questions at this time. Pt ambulatory with steady gait, clear speech.  

## 2021-03-08 NOTE — Discharge Instructions (Addendum)
It was a pleasure taking care of you today.  As discussed, your COVID and influenza test are pending.  Results should be available within the next 2 hours on MyChart.  I am sending you home with cough medication, Tylenol, and nausea medication.  Take as needed.  Please follow-up with PCP if symptoms not improved within the next week.  Return to the ER for any worsening symptoms.

## 2021-03-08 NOTE — ED Triage Notes (Signed)
Patient reports son is Covid positive. Reports cough and fever started today.

## 2021-03-08 NOTE — ED Provider Notes (Signed)
[facility]-EMERGENCY DEPT Provider Note   CSN: [id number] Arrival date & time: 03/08/21  1838     History Chief Complaint  Patient presents with   Cough    Leah [name] is a 25 y.o. female with a past medical history significant for anemia, bipolar 1 disorder, GERD, and pseudoseizures who presents to the ED due to COVID-like symptoms.  Patient admits to a sore throat, cough, and fever that started earlier today.  Patient's son recently tested positive for COVID.  Patient is vaccinated against COVID-19 however, has not received her booster shot.  Patient had COVID-19 last month.  Denies chest pain or shortness of breath.  Denies abdominal pain, nausea, vomiting, diarrhea.  Denies difficulty swallowing.  She has not tried thing for symptoms.  Patient also endorses a migraine headache.  No visual changes, speech changes, unilateral weakness, or dizziness.  No head injury.  No aggravating alleviating factors.  History obtained from patient and past medical records. No interpreter used during encounter.       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   Suicide attempt by drug ingestion (HCC)    Anemia, iron deficiency 12/01/2012    Past Surgical History:  Procedure Laterality Date   CHOLECYSTECTOMY N/A 09/28/2014   Procedure: LAPAROSCOPIC CHOLECYSTECTOMY;  Surgeon: Axel Filler, MD;  Location: [facility] OR;  Service: General;  Laterality: N/A;   DIRECT LARYNGOSCOPY N/A 04/24/2014   Procedure: DIRECT LARYNGOSCOPY;  Surgeon: [name], MD;  Location: [facility] OR;  Service: ENT;  Laterality: N/A;   FOREIGN BODY REMOVAL ESOPHAGEAL  N/A 04/24/2014   Procedure: REMOVAL FOREIGN BODY ESOPHAGEAL;  Surgeon: [name], MD;  Location: [facility] OR;  Service: ENT;  Laterality: N/A;   WISDOM TOOTH EXTRACTION  2013     OB History     Gravida  2   Para  2   Term  2   Preterm  0   AB  0   Living  2      SAB  0   IAB  0   Ectopic  0   Multiple  0   Live Births  2           Family History  Problem Relation Age of Onset   Arthritis Mother    Bronchitis Mother    Asthma Mother    Hearing loss Paternal Grandfather    Diabetes Neg Hx    Stomach cancer Neg Hx    Colon cancer Neg Hx     Social History   Tobacco Use   Smoking status: Never   Smokeless tobacco: Never   Tobacco comments:    marjiuna  Vaping Use   Vaping Use: Never used  Substance Use Topics   Alcohol use: Not Currently    Alcohol/week: 6.0 standard drinks    Types: 6 Cans of beer per week   Drug use: Not Currently    Types: Marijuana    Comment: last used before pregnancy    Home Medications Prior to Admission medications   Medication Sig Start Date End Date Taking? Authorizing Provider  acetaminophen (TYLENOL  8 HOUR) 650 MG CR tablet Take 1 tablet (650 mg total) by mouth every 8 (eight) hours as needed for pain. 03/08/21  Yes Haile Bosler, Merla Riches, PA-C  benzonatate (TESSALON) 100 MG capsule Take 1 capsule (100 mg total) by mouth every 8 (eight) hours. 03/08/21  Yes Chantille Navarrete C, PA-C  ondansetron (ZOFRAN ODT) 4 MG disintegrating tablet Take 1 tablet (4 mg total) by mouth every 8 (eight) hours as needed for nausea or vomiting. 03/08/21  Yes Abbagayle Zaragoza, Merla Riches, PA-C  guaifenesin (ROBITUSSIN) 100 MG/5ML syrup Take 5-10 mLs (100-200 mg total) by mouth every 4 (four) hours as needed for cough. 01/30/21   Curatolo, Adam, DO  ibuprofen (ADVIL) 600 MG tablet Take 1 tablet (600 mg total) by mouth every 6 (six) hours. 10/23/20   Marylene Land, CNM  metroNIDAZOLE (METROGEL VAGINAL) 0.75 % vaginal gel Insert 1 applicatorful nightly  x 10 nights then twice a week for 6 months 03/06/21   Bernerd Limbo, CNM  OLANZapine (ZYPREXA) 5 MG tablet Take 1 tablet (5 mg total) by mouth at bedtime. 11/15/20   Shanna Cisco, NP  prenatal vitamin w/FE, FA (PRENATAL 1 + 1) 27-1 MG TABS tablet Take 1 tablet by mouth daily at 12 noon.    [provider]    Allergies    Bactrim [sulfamethoxazole-trimethoprim]  Review of Systems   Review of Systems  Constitutional:  Positive for fatigue and fever.  HENT:  Positive for sore throat. Negative for rhinorrhea, trouble swallowing and voice change.   Respiratory:  Positive for cough. Negative for shortness of breath.   Cardiovascular:  Negative for chest pain.  Gastrointestinal:  Negative for abdominal pain, diarrhea, nausea and vomiting.  Neurological:  Positive for headaches.   Physical Exam Updated Vital Signs BP 130/74   Pulse (!) 101   Temp 99.6 F (37.6 C) (Oral)   Resp 16   SpO2 98%   Breastfeeding Unknown   Physical Exam Vitals and nursing note reviewed.  Constitutional:      General: She is not in acute distress.    Appearance: She is not ill-appearing.  HENT:     Head: Normocephalic.     Mouth/Throat:     Comments: Posterior oropharynx clear and mucous membranes moist, there is mild erythema but no edema or tonsillar exudates, uvula midline, normal phonation, no trismus, tolerating secretions without difficulty. Eyes:     Pupils: Pupils are equal, round, and reactive to light.  Neck:     Comments: No meningismus Cardiovascular:     Rate and Rhythm: Normal rate and regular rhythm.     Pulses: Normal pulses.     Heart sounds: Normal heart sounds. No murmur heard.   No friction rub. No gallop.  Pulmonary:     Effort: Pulmonary effort is normal.     Breath sounds: Normal breath sounds.  Abdominal:     General: Abdomen is flat. There is no distension.     Palpations: Abdomen is soft.     Tenderness: There is no abdominal tenderness. There is no  guarding or rebound.  Musculoskeletal:        General: Normal range of motion.     Cervical back: Neck supple.  Skin:    General: Skin is warm and dry.  Neurological:     General: No focal deficit present.     Mental Status: She is alert.  Psychiatric:        Mood and Affect: Mood normal.  Behavior: Behavior normal.    ED Results / Procedures / Treatments   Labs (all labs ordered are listed, but only abnormal results are displayed) Labs Reviewed  RESP PANEL BY RT-PCR (FLU A&B, COVID) [id number]    EKG None  Radiology No results found.  Procedures Procedures   Medications Ordered in ED Medications  diphenhydrAMINE (BENADRYL) capsule 25 mg (25 mg Oral Given 03/08/21 1910)  prochlorperazine (COMPAZINE) injection 10 mg (10 mg Intramuscular Given 03/08/21 1910)  acetaminophen (TYLENOL) tablet 650 mg (650 mg Oral Given 03/08/21 1910)    ED Course  I have reviewed the triage vital signs and the nursing notes.  Pertinent labs & imaging results that were available during my care of the patient were reviewed by me and considered in my medical decision making (see chart for details).    MDM Rules/Calculators/A&P                          25 year old female presents to the ED due to COVID-like symptoms for the past day.  Patient's son just tested positive for COVID-19.  Patient is currently vaccinated against COVID-19 however, has not received her booster shot.  She recently had COVID last month.  Upon arrival, patient borderline febrile at 99.6 F and tachycardic at 110.  Suspect tachycardia related to fever.  No chest pain or fever.  No lower extremity edema.  Suspicion for PE/DVT.  Patient nontoxic-appearing.  Benign physical exam.  No meningismus to suggest meningitis.  Lungs clear to auscultation bilaterally.  Low suspicion for pneumonia.  Abdomen soft, nondistended, nontender. COVID test ordered. Benadryl and compazine given for headache. Tylenol for possible fever.   7:29 PM  reassessed patient at bedside who notes resolution in headache.  Suspect symptoms related to viral etiology.  Patient discharged with symptomatic treatment.  Patient's heart rate improved to 101.  Suspect tachycardia related to moderate fever.  Low suspicion for PE.  Patient has no episodes of hypoxia.  Denies chest pain and shortness of breath.  Advised patient to follow-up with PCP within the next week for further evaluation.  COVID test pending.  Quarantine guidelines discussed with patient. Strict ED precautions discussed with patient. Patient states understanding and agrees to plan. Patient discharged home in no acute distress and stable vitals.  Leah [name] was evaluated in Emergency Department on 03/08/2021 for the symptoms described in the history of present illness. She was evaluated in the context of the global COVID-19 pandemic, which necessitated consideration that the patient might be at risk for infection with the SARS-CoV-2 virus that causes COVID-19. Institutional protocols and algorithms that pertain to the evaluation of patients at risk for COVID-19 are in a state of rapid change based on information released by regulatory bodies including the CDC and federal and state organizations. These policies and algorithms were followed during the patient's care in the ED.    Final Clinical Impression(s) / ED Diagnoses Final diagnoses:  Cough    Rx / DC Orders ED Discharge Orders          Ordered    acetaminophen (TYLENOL 8 HOUR) 650 MG CR tablet  Every 8 hours PRN        03/08/21 1933    benzonatate (TESSALON) 100 MG capsule  Every 8 hours        03/08/21 1933    ondansetron (ZOFRAN ODT) 4 MG disintegrating tablet  Every 8 hours PRN        03/08/21  [address]1933             Jesusita Okaberman, Avielle Imbert C, PA-C 03/08/21 1933    Charlynne PanderYao, David Hsienta, MD 03/09/21 [id number]

## 2021-03-12 ENCOUNTER — Ambulatory Visit: Payer: Self-pay

## 2021-03-20 ENCOUNTER — Encounter (HOSPITAL_COMMUNITY): Payer: Self-pay

## 2021-03-20 ENCOUNTER — Emergency Department (HOSPITAL_COMMUNITY)
Admission: EM | Admit: 2021-03-20 | Discharge: 2021-03-21 | Disposition: A | Discharge status: Home / Self Care | Payer: Medicaid Other | Attending: Student | Admitting: Student

## 2021-03-20 ENCOUNTER — Other Ambulatory Visit: Payer: Self-pay

## 2021-03-20 DIAGNOSIS — R400 Somnolence: Secondary | ICD-10-CM | POA: Diagnosis not present

## 2021-03-20 DIAGNOSIS — X838XXA Intentional self-harm by other specified means, initial encounter: Secondary | ICD-10-CM | POA: Diagnosis not present

## 2021-03-20 DIAGNOSIS — N9489 Other specified conditions associated with female genital organs and menstrual cycle: Secondary | ICD-10-CM | POA: Diagnosis not present

## 2021-03-20 DIAGNOSIS — Y900 Blood alcohol level of less than 20 mg/100 ml: Secondary | ICD-10-CM | POA: Insufficient documentation

## 2021-03-20 DIAGNOSIS — R000 Tachycardia, unspecified: Secondary | ICD-10-CM | POA: Diagnosis not present

## 2021-03-20 DIAGNOSIS — F319 Bipolar disorder, unspecified: Secondary | ICD-10-CM | POA: Diagnosis not present

## 2021-03-20 DIAGNOSIS — R410 Disorientation, unspecified: Secondary | ICD-10-CM | POA: Insufficient documentation

## 2021-03-20 DIAGNOSIS — T481X2A Poisoning by skeletal muscle relaxants [neuromuscular blocking agents], intentional self-harm, initial encounter: Secondary | ICD-10-CM | POA: Diagnosis not present

## 2021-03-20 DIAGNOSIS — T50904A Poisoning by unspecified drugs, medicaments and biological substances, undetermined, initial encounter: Secondary | ICD-10-CM

## 2021-03-20 LAB — COMPREHENSIVE METABOLIC PANEL
ALT: 21 U/L (ref 0–44)
AST: 21 U/L (ref 15–41)
Albumin: 4.6 g/dL (ref 3.5–5.0)
Alkaline Phosphatase: 69 U/L (ref 38–126)
Anion gap: 7 (ref 5–15)
BUN: 11 mg/dL (ref 6–20)
CO2: 24 mmol/L (ref 22–32)
Calcium: 9.6 mg/dL (ref 8.9–10.3)
Chloride: 113 mmol/L — ABNORMAL HIGH (ref 98–111)
Creatinine, Ser: 0.76 mg/dL (ref 0.44–1.00)
GFR, Estimated: >60 mL/min (ref >60)
Glucose, Bld: 95 mg/dL (ref 70–99)
Potassium: 3.9 mmol/L (ref 3.5–5.1)
Sodium: 144 mmol/L (ref 135–145)
Total Bilirubin: 0.5 mg/dL (ref 0.3–1.2)
Total Protein: 8.6 g/dL — ABNORMAL HIGH (ref 6.5–8.1)

## 2021-03-20 LAB — CBC WITH DIFFERENTIAL/PLATELET
Abs Immature Granulocytes: 0.04 10*3/uL (ref 0.00–0.07)
Basophils Absolute: 0 10*3/uL (ref 0.0–0.1)
Basophils Relative: 0 %
Eosinophils Absolute: 0 10*3/uL (ref 0.0–0.5)
Eosinophils Relative: 0 %
HCT: 39 % (ref 36.0–46.0)
Hemoglobin: 12.5 g/dL (ref 12.0–15.0)
Immature Granulocytes: 0 %
Lymphocytes Relative: 13 %
Lymphs Abs: 1.2 10*3/uL (ref 0.7–4.0)
MCH: 29.1 pg (ref 26.0–34.0)
MCHC: 32.1 g/dL (ref 30.0–36.0)
MCV: 90.7 fL (ref 80.0–100.0)
Monocytes Absolute: 0.2 10*3/uL (ref 0.1–1.0)
Monocytes Relative: 3 %
Neutro Abs: 8 10*3/uL — ABNORMAL HIGH (ref 1.7–7.7)
Neutrophils Relative %: 84 %
Platelets: 304 10*3/uL (ref 150–400)
RBC: 4.3 MIL/uL (ref 3.87–5.11)
RDW: 13.9 % (ref 11.5–15.5)
WBC: 9.6 10*3/uL (ref 4.0–10.5)
nRBC: 0 % (ref 0.0–0.2)

## 2021-03-20 LAB — I-STAT BETA HCG BLOOD, ED (MC, WL, AP ONLY): I-stat hCG, quantitative: <5 m[IU]/mL (ref <5)

## 2021-03-20 LAB — ACETAMINOPHEN LEVEL: Acetaminophen (Tylenol), Serum: <10 ug/mL — ABNORMAL LOW (ref 10–30)

## 2021-03-20 LAB — ETHANOL: Alcohol, Ethyl (B): <10 mg/dL (ref <10)

## 2021-03-20 LAB — SALICYLATE LEVEL: Salicylate Lvl: <7 mg/dL — ABNORMAL LOW (ref 7.0–30.0)

## 2021-03-20 MED ORDER — SODIUM CHLORIDE 0.9 % IV BOLUS
1000.0000 mL | Freq: Once | INTRAVENOUS | Status: AC
  Administered 2021-03-20: 1000 mL via INTRAVENOUS

## 2021-03-20 MED ORDER — LACTATED RINGERS IV SOLN: INTRAVENOUS | Status: DC

## 2021-03-20 NOTE — ED Provider Notes (Signed)
Emergency Medicine Provider Triage Evaluation Note  Leah [name] , a 25 y.o. female  was evaluated in triage. Patient brought in by SO who reports he found her on the ground with an empty bottle of flexeril.  Pt complains of feeling overwhelmed, but denies SI/HI/AVH. She reports she had a seizure also (?) difficult to understand. Patient denies nausea, vomiting, abdominal pain.  Review of Systems  Positive: Patient unresponsive Negative: SI/HI/AVH  Physical Exam  BP 128/88 (BP Location: Left Arm)   Pulse (!) 125   Temp 98.6 F (37 C) (Oral)   Resp (!) 22   SpO2 99%  Gen:   Awake, not very responsive, soft words without much sense  Resp:  shallow breathing, lungs CTA MSK:   Moves extremities without difficulty  Other:  Tachycardic, no TTP of abdomen  Medical Decision Making  Medically screening exam initiated at 9:26 PM.  Appropriate orders placed.  Leah [name] was informed that the remainder of the evaluation will be completed by another provider, this initial triage assessment does not replace that evaluation, and the importance of remaining in the ED until their evaluation is complete.  Toxic ingestion flexeril   [place] 03/20/21 2130    Benjiman Core, MD 03/21/21 [phone]

## 2021-03-20 NOTE — ED Notes (Signed)
Registration advised pt was in the bathroom when her name was called.

## 2021-03-20 NOTE — ED Notes (Signed)
Poison control notified. Recommendation: observation time no less than 4 hours or until baseline.

## 2021-03-20 NOTE — ED Triage Notes (Signed)
Pt's boyfriend states that he came home around 2000 and found pt acting strange. He gave me an empty pill bottle of Flexeril 10 mg tablets. Pt is easily aroused but not answering questions. She is nodding to answer yes or no. Pt did state that she was not trying to harm herself. Boyfriend states that they are having relationship problems at home.

## 2021-03-21 NOTE — Discharge Instructions (Addendum)
Follow-up with your counselor as an outpatient and return to the ER if your symptoms worsen or change.

## 2021-03-21 NOTE — ED Provider Notes (Signed)
[facility]-EMERGENCY DEPT Provider Note   CSN: [id number] Arrival date & time: 03/20/21  2034     History Chief Complaint  Patient presents with   Ingestion    Leah [name] is a 25 y.o. female with PMH bipolar 1 disorder on Zyprexa, PNES, anemia who presents to the emergency department for evaluation of an intentional ingestion.  Patient states that she "got stressed" and took an unknown amount of Flexeril.  She states that she took at least more than 10 tablets of 10 mg Flexeril.  She arrives somnolent with tachycardia and intermittent confusion.  Denies active suicidal ideation at this time and states that she did not take these medications in an attempt to kill herself.  She denies auditory visual hallucinations, denies homicidal ideation.  Denies coingestions.   Ingestion Pertinent negatives include no chest pain, no abdominal pain and no shortness of breath.      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   Suicide attempt by drug ingestion (HCC)    Anemia, iron deficiency 12/01/2012    Past Surgical History:  Procedure Laterality Date   CHOLECYSTECTOMY N/A 09/28/2014   Procedure: LAPAROSCOPIC CHOLECYSTECTOMY;  Surgeon: Axel Filler, MD;  Location: [facility] OR;  Service: General;  Laterality: N/A;   DIRECT LARYNGOSCOPY N/A 04/24/2014   Procedure: DIRECT LARYNGOSCOPY;  Surgeon: [name], MD;  Location: [facility] OR;  Service: ENT;  Laterality: N/A;   FOREIGN BODY REMOVAL ESOPHAGEAL N/A 04/24/2014   Procedure: REMOVAL FOREIGN BODY ESOPHAGEAL;  Surgeon: [name], MD;  Location: [facility] OR;  Service: ENT;  Laterality: N/A;    WISDOM TOOTH EXTRACTION  2013     OB History     Gravida  2   Para  2   Term  2   Preterm  0   AB  0   Living  2      SAB  0   IAB  0   Ectopic  0   Multiple  0   Live Births  2           Family History  Problem Relation Age of Onset   Arthritis Mother    Bronchitis Mother    Asthma Mother    Hearing loss Paternal Grandfather    Diabetes Neg Hx    Stomach cancer Neg Hx    Colon cancer Neg Hx     Social History   Tobacco Use   Smoking status: Never   Smokeless tobacco: Never   Tobacco comments:    marjiuna  Vaping Use   Vaping Use: Never used  Substance Use Topics   Alcohol use: Not Currently    Alcohol/week: 6.0 standard drinks    Types: 6 Cans of beer per week   Drug use: Not Currently    Types: Marijuana    Comment: last used before pregnancy    Home Medications Prior to Admission medications   Medication Sig Start Date End Date Taking? Authorizing Provider  acetaminophen (TYLENOL 8 HOUR) 650 MG CR tablet Take 1 tablet (650 mg total) by mouth every 8 (eight) hours as needed  for pain. 03/08/21   Mannie Stabile, PA-C  benzonatate (TESSALON) 100 MG capsule Take 1 capsule (100 mg total) by mouth every 8 (eight) hours. 03/08/21   Mannie Stabile, PA-C  guaifenesin (ROBITUSSIN) 100 MG/5ML syrup Take 5-10 mLs (100-200 mg total) by mouth every 4 (four) hours as needed for cough. 01/30/21   Curatolo, Adam, DO  ibuprofen (ADVIL) 600 MG tablet Take 1 tablet (600 mg total) by mouth every 6 (six) hours. 10/23/20   Marylene Land, CNM  metroNIDAZOLE (METROGEL VAGINAL) 0.75 % vaginal gel Insert 1 applicatorful nightly x 10 nights then twice a week for 6 months 03/06/21   Bernerd Limbo, CNM  OLANZapine (ZYPREXA) 5 MG tablet Take 1 tablet (5 mg total) by mouth at bedtime. 11/15/20   Shanna Cisco, NP  ondansetron (ZOFRAN ODT) 4 MG disintegrating tablet Take 1 tablet (4 mg total) by mouth every 8 (eight) hours as needed for nausea or  vomiting. 03/08/21   Mannie Stabile, PA-C  prenatal vitamin w/FE, FA (PRENATAL 1 + 1) 27-1 MG TABS tablet Take 1 tablet by mouth daily at 12 noon.    [provider]    Allergies    Bactrim [sulfamethoxazole-trimethoprim]  Review of Systems   Review of Systems  Constitutional:  Negative for chills and fever.  HENT:  Negative for ear pain and sore throat.   Eyes:  Negative for pain and visual disturbance.  Respiratory:  Negative for cough and shortness of breath.   Cardiovascular:  Negative for chest pain and palpitations.  Gastrointestinal:  Negative for abdominal pain and vomiting.  Genitourinary:  Negative for dysuria and hematuria.  Musculoskeletal:  Negative for arthralgias and back pain.  Skin:  Negative for color change and rash.  Neurological:  Negative for seizures and syncope.  Psychiatric/Behavioral:  Positive for confusion.   All other systems reviewed and are negative.  Physical Exam Updated Vital Signs BP 108/75   Pulse 89   Temp 98.6 F (37 C) (Oral)   Resp 14   SpO2 99%   Physical Exam Vitals and nursing note reviewed.  Constitutional:      General: She is not in acute distress.    Appearance: She is well-developed.  HENT:     Head: Normocephalic and atraumatic.  Eyes:     Conjunctiva/sclera: Conjunctivae normal.  Cardiovascular:     Rate and Rhythm: Regular rhythm. Tachycardia present.     Heart sounds: No murmur heard. Pulmonary:     Effort: Pulmonary effort is normal. No respiratory distress.     Breath sounds: Normal breath sounds.  Abdominal:     Palpations: Abdomen is soft.     Tenderness: There is no abdominal tenderness.  Musculoskeletal:     Cervical back: Neck supple.  Skin:    General: Skin is warm and dry.  Neurological:     Mental Status: She is alert. She is disoriented.    ED Results / Procedures / Treatments   Labs (all labs ordered are listed, but only abnormal results are displayed) Labs Reviewed  CBC WITH  DIFFERENTIAL/PLATELET - Abnormal; Notable for the following components:      Result Value   Neutro Abs 8.0 (*)    All other components within normal limits  COMPREHENSIVE METABOLIC PANEL - Abnormal; Notable for the following components:   Chloride 113 (*)    Total Protein 8.6 (*)    All other components within normal limits  SALICYLATE LEVEL - Abnormal; Notable for the  following components:   Salicylate Lvl <7.0 (*)    All other components within normal limits  ACETAMINOPHEN LEVEL - Abnormal; Notable for the following components:   Acetaminophen (Tylenol), Serum <10 (*)    All other components within normal limits  ETHANOL  URINALYSIS, ROUTINE W REFLEX MICROSCOPIC  RAPID URINE DRUG SCREEN, HOSP PERFORMED  I-STAT BETA HCG BLOOD, ED ([facility], [facility], [facility] ONLY)    EKG EKG Interpretation  Date/Time:  Tuesday March 20 2021 21:12:05 EDT Ventricular Rate:  118 PR Interval:  149 QRS Duration: 86 QT Interval:  323 QTC Calculation: 453 R Axis:   66 Text Interpretation: Sinus tachycardia Low voltage, precordial leads Confirmed by Benjiman Core [phone]) on 03/20/2021 9:48:39 PM  Radiology No results found.  Procedures Procedures   Medications Ordered in ED Medications  lactated ringers infusion ( Intravenous New Bag/Given 03/20/21 2336)  sodium chloride 0.9 % bolus 1,000 mL (1,000 mLs Intravenous New Bag/Given 03/20/21 2148)    ED Course  I have reviewed the triage vital signs and the nursing notes.  Pertinent labs & imaging results that were available during my care of the patient were reviewed by me and considered in my medical decision making (see chart for details).    MDM Rules/Calculators/A&P                           Patient seen the emergency department for evaluation of an intentional ingestion of Flexeril.  Physical exam reveals a confused tachycardic patient but is otherwise unremarkable.  She complains of dry mouth consistent with anticholinergic toxicity.  Poison center  consulted recommending 6 to 8-hour observation and fluid resuscitation.  Aspirin, Tylenol negative, laboratory evaluation unremarkable.  Patient then signed out to oncoming provider.  Please see provider signout note for continuation of work-up.  Anticipate discharge home. Final Clinical Impression(s) / ED Diagnoses Final diagnoses:  None    Rx / DC Orders ED Discharge Orders     None        Shelsey Rieth, Wyn Forster, MD 03/21/21 0020
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
## 2021-03-21 NOTE — ED Notes (Signed)
Called dr to bedside due to patient wanting to leave

## 2021-03-21 NOTE — ED Provider Notes (Signed)
  Physical Exam  BP 118/81 (BP Location: Right Arm)   Pulse 84   Temp 98.6 F (37 C) (Oral)   Resp 16   SpO2 98%   Physical Exam Vitals and nursing note reviewed.  Constitutional:      General: She is not in acute distress.    Appearance: Normal appearance. She is not ill-appearing.  HENT:     Head: Normocephalic and atraumatic.  Pulmonary:     Effort: Pulmonary effort is normal.  Neurological:     General: No focal deficit present.     Mental Status: She is alert.  Psychiatric:        Attention and Perception: Attention normal.        Mood and Affect: Mood normal.        Speech: Speech normal.        Behavior: Behavior normal.        Thought Content: Thought content does not include homicidal or suicidal ideation. Thought content does not include homicidal or suicidal plan.        Cognition and Memory: Cognition normal.        Judgment: Judgment normal.    ED Course/Procedures     Procedures  MDM   Care assumed from Dr. Audrie Lia at shift change.  Patient apparently took an overdose of Flexeril earlier this evening.  Poison control was notified and recommended a 6 to 8-hour observation period.  She has been observed now for nearly 8 hours.  Patient is awake and alert.  She has been reassessed and assures me that she is not suicidal and tells me that she took these medications not to harm herself, but did get some sleep because she felt "overwhelmed".  At this point, patient seems to have decision-making capacity.  I do not feel as though pursuing IVC is indicated at this time.  Patient assures me that she is not suicidal and has a counselor with whom she can follow-up as an outpatient.      Geoffery Lyons, MD 03/21/21 [phone]

## 2021-03-21 NOTE — ED Notes (Signed)
TTS consult in progress. °

## 2021-04-05 ENCOUNTER — Telehealth (INDEPENDENT_AMBULATORY_CARE_PROVIDER_SITE_OTHER): Payer: Medicaid Other | Admitting: Psychiatry

## 2021-04-05 ENCOUNTER — Encounter (HOSPITAL_COMMUNITY): Payer: Self-pay | Admitting: Psychiatry

## 2021-04-05 DIAGNOSIS — F319 Bipolar disorder, unspecified: Secondary | ICD-10-CM | POA: Diagnosis not present

## 2021-04-05 MED ORDER — OLANZAPINE 5 MG PO TABS: 5.0000 mg | ORAL_TABLET | Freq: Every day | ORAL | 3 refills | Status: DC

## 2021-04-05 NOTE — Progress Notes (Signed)
BH MD/PA/NP OP Progress Note Virtual Visit via Video Note  I connected with Leah [name] on 04/05/21 at  2:00 PM EDT by a video enabled telemedicine application and verified that I am speaking with the correct person using two identifiers.  Location: Patient: Home Provider: Clinic   I discussed the limitations of evaluation and management by telemedicine and the availability of in person appointments. The patient expressed understanding and agreed to proceed.  I provided 30 minutes of non-face-to-face time during this encounter.    04/05/2021 1:57 PM Leah [name]  MRN:  [id number]  Chief Complaint: "I had a little break down"  HPI: 25 year old female seen today for follow up psychiatric evaluation.  She has a psychiatric history of depression, bipolar disorder, and SI/SA.  She was recently admitted at [facility] long ED on 03/20/2021 to 03/21/2021 after she overdosed on Flexeril.  Per chart patient denied suicidal ideation and noted that she took the Flexeril to rest.  She is currently managed on Zyprexa 5 mg.  She notes that her medications are effective in managing her psychiatric conditions.    Today patient was well-groomed, pleasant, cooperative, engaged in conversation, and maintained eye contact.  She informed Clinical research associate that since her last visit she had a little breakdown.  She refers to her breakdown is being overwhelmed and depressed.  She notes that she took's Flexeril to assist with this and ended up in [facility] long ED.  Provider asked patient if there was situations in life that exacerbated her depression and she notes that it was not.  She notes that she now feels supported at home.  She informed Clinical research associate that she is looking forward to her daughter starting daycare soon.  Currently patient notes that her anxiety and depression are well managed.  Provider conducted a GAD-7 and patient scored a 7, at her last visit she scored a 7.  Provider also conducted PHQ-9 and patient scored a 9, at her last  visit she scored a 3.  She endorses adequate appetite and sleep.  Today she denies SI/HI/VAH, mania, paranoia   Patient informed writer that recently she has been having pain in her toe.  She denies symptoms of TD.  Provider asked patient if she is hydrating with water and she notes that she never drinks water.  Provider encouraged patient to increase her water intake.  She endorsed understanding and agreed.    Provider asked patient if she wanted to be on antidepressant to help manage her anxiety and depression.  At this time she notes that she does not want her medication changed. No medication changes made today.  Patient will continue all medications as prescribed. No other concerns noted at this time. Visit Diagnosis:    ICD-10-CM   1. Bipolar 1 disorder (HCC)  F31.9 OLANZapine (ZYPREXA) 5 MG tablet      Past Psychiatric History: depression, bipolar disorder, and SI/SA.  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    Past Surgical History:  Procedure Laterality Date   CHOLECYSTECTOMY N/A 09/28/2014   Procedure: LAPAROSCOPIC CHOLECYSTECTOMY;  Surgeon: Axel Filler, MD;  [facility]: [facility] OR;  Service: General;  Laterality: N/A;   DIRECT LARYNGOSCOPY N/A 04/24/2014   Procedure: DIRECT LARYNGOSCOPY;  Surgeon: [name], MD;  Location: [facility] OR;  Service: ENT;  Laterality:  N/A;   FOREIGN BODY REMOVAL ESOPHAGEAL N/A 04/24/2014   Procedure: REMOVAL FOREIGN BODY ESOPHAGEAL;  Surgeon: [name], MD;  Location: [facility] OR;  Service: ENT;  Laterality: N/A;   WISDOM TOOTH EXTRACTION  2013    Family Psychiatric History: Denies  Family History:  Family History  Problem Relation Age of Onset   Arthritis Mother    Bronchitis Mother    Asthma Mother    Hearing loss Paternal Grandfather    Diabetes Neg Hx    Stomach cancer Neg Hx    Colon  cancer Neg Hx     Social History:  Social History   Socioeconomic History   Marital status: Single    Spouse name: Not on file   Number of children: 1   Years of education: 12 th    Highest education level: Not on file  Occupational History   Occupation: Conservation officer, [employer]  Tobacco Use   Smoking status: Never   Smokeless tobacco: Never   Tobacco comments:    marjiuna  Vaping Use   Vaping Use: Never used  Substance and Sexual Activity   Alcohol use: Not Currently    Alcohol/week: 6.0 standard drinks    Types: 6 Cans of beer per week   Drug use: Not Currently    Types: Marijuana    Comment: last used before pregnancy   Sexual activity: Yes    Partners: Male    Birth control/protection: Patch  Other Topics Concern   Not on file  Social History Narrative   ** Merged History Encounter **       Live with roommate and son.    Starting at [employer].    Social Determinants of Health   Financial Resource Strain: Low Risk    Difficulty of Paying Living Expenses: Not hard at all  Food Insecurity: No Food Insecurity   Worried About Programme researcher, broadcasting/film/video in the Last Year: Never true   Ran Out of Food in the Last Year: Never true  Transportation Needs: No Transportation Needs   Lack of Transportation (Medical): No   Lack of Transportation (Non-Medical): No  Physical Activity: Inactive   Days of Exercise per Week: 0 days   Minutes of Exercise per Session: 0 min  Stress: No Stress Concern Present   Feeling of Stress : Only a little  Social Connections: Socially Isolated   Frequency of Communication with Friends and Family: Once a week   Frequency of Social Gatherings with Friends and Family: Never   Attends Religious Services: Never   Database administrator or Organizations: No   Attends Banker Meetings: Never   Marital Status: Never married    Allergies:  Allergies  Allergen Reactions   Bactrim [Sulfamethoxazole-Trimethoprim] Rash    Metabolic  Disorder Labs: Lab Results  Component Value Date   HGBA1C 5.6 04/11/2020   MPG 105.41 12/31/2018   MPG 103 08/22/2016   Lab Results  Component Value Date   PROLACTIN 18.9 10/01/2017   PROLACTIN 6.4 11/18/2014   Lab Results  Component Value Date   CHOL 159 12/31/2018   TRIG 31 12/31/2018   HDL 57 12/31/2018   CHOLHDL 2.8 12/31/2018   VLDL 6 12/31/2018   LDLCALC 96 12/31/2018   LDLCALC 82 08/22/2016   Lab Results  Component Value Date   TSH 0.940 12/31/2018   TSH 0.809 10/01/2017    Therapeutic Level Labs: No results found for: LITHIUM No results found for: VALPROATE No components found  for:  CBMZ  Current Medications: Current Outpatient Medications  Medication Sig Dispense Refill   acetaminophen (TYLENOL 8 HOUR) 650 MG CR tablet Take 1 tablet (650 mg total) by mouth every 8 (eight) hours as needed for pain. 15 tablet 0   benzonatate (TESSALON) 100 MG capsule Take 1 capsule (100 mg total) by mouth every 8 (eight) hours. 21 capsule 0   guaifenesin (ROBITUSSIN) 100 MG/5ML syrup Take 5-10 mLs (100-200 mg total) by mouth every 4 (four) hours as needed for cough. 60 mL 0   ibuprofen (ADVIL) 600 MG tablet Take 1 tablet (600 mg total) by mouth every 6 (six) hours. 30 tablet 0   metroNIDAZOLE (METROGEL VAGINAL) 0.75 % vaginal gel Insert 1 applicatorful nightly x 10 nights then twice a week for 6 months 70 g 6   OLANZapine (ZYPREXA) 5 MG tablet Take 1 tablet (5 mg total) by mouth at bedtime. 30 tablet 3   ondansetron (ZOFRAN ODT) 4 MG disintegrating tablet Take 1 tablet (4 mg total) by mouth every 8 (eight) hours as needed for nausea or vomiting. 20 tablet 0   prenatal vitamin w/FE, FA (PRENATAL 1 + 1) 27-1 MG TABS tablet Take 1 tablet by mouth daily at 12 noon.     No current facility-administered medications for this visit.     Musculoskeletal: Strength & Muscle Tone:  Unable to assess due to telehealth visit Gait & Station:  Unable to assess due to telehealth visit Patient  leans: N/A  Psychiatric Specialty Exam: Review of Systems  unknown if currently breastfeeding.There is no height or weight on file to calculate BMI.  General Appearance: Well Groomed  Eye Contact:  Good  Speech:  Clear and Coherent and Normal Rate  Volume:  Normal  Mood:  Euthymic  Affect:  Appropriate and Congruent  Thought Process:  Coherent, Goal Directed and Linear  Orientation:  Full (Time, Place, and Person)  Thought Content: WDL and Logical   Suicidal Thoughts:  No  Homicidal Thoughts:  No  Memory:  Immediate;   Good Recent;   Good Remote;   Good  Judgement:  Good  Insight:  Good  Psychomotor Activity:  Normal  Concentration:  Concentration: Good and Attention Span: Good  Recall:  Good  Fund of Knowledge: Good  Language: Good  Akathisia:  No  Handed:  Right  AIMS (if indicated): Not done  Assets:  Communication Skills Desire for Improvement Financial Resources/Insurance Housing Physical Health Social Support Vocational/Educational  ADL's:  Intact  Cognition: WNL  Sleep:  Good   Screenings: AIMS    Flowsheet Row [facility]Dis[facility]) [facility]  AIMS Total Score 0 0      AUDIT    Flowsheet Row Admission (Discharged) [facility] (Discharged) from [facility]19/2016 in [facility] Admission (Discharged) [facility]  Alcohol Use Disorder Identification Test Final Score (AUDIT) 1 1 8       GAD-7    Flowsheet Row Video Visit from 04/05/2021 in [facility] Clinical Support from 11/30/2020 in [facility] Video Visit from 11/15/2020 in [facility] Integrated  Behavioral Health from 11/06/2020 in [facility] Routine Prenatal from 10/19/2020 in Center  for [employer] [facility]  Total GAD-7 Score 7 2 7 3 3       PHQ2-9    Flowsheet Row Video Visit from 04/05/2021 in [facility] ED from 03/20/2021 in [facility] Clinical Support from 11/30/2020 in Center for [facility] for Women Video Visit from 11/15/2020 in [facility] Integrated Behavioral Health from 11/06/2020 in [facility] for Women  PHQ-2 Total Score 2 5 0 0 0  PHQ-9 Total Score 9 13 0 3 0      Flowsheet Row Video Visit from 04/05/2021 in [facility] ED from 03/20/2021 in [facility] DEPT ED from 03/08/2021 in [facility]  C-SSRS RISK CATEGORY Moderate Risk High Risk No Risk        Assessment and Plan: Patient notes that her anxiety and depression has somewhat worsened however notes that it is manageable.  Provider recommended starting an antidepressant however patient notes at this time she does not want her medications adjusted. No medication changes made today.  Patient will continue all medications as prescribed.   1. Bipolar 1 disorder (HCC)  Continue- OLANZapine (ZYPREXA) 5 MG tablet; Take 1 tablet (5 mg total) by mouth at bedtime.  Dispense: 30 tablet; Refill: 3  Follow-up in 3 months  03/10/2021, NP 04/05/2021, 1:57 PM

## 2021-04-16 DIAGNOSIS — Z111 Encounter for screening for respiratory tuberculosis: Secondary | ICD-10-CM | POA: Diagnosis not present

## 2021-04-16 DIAGNOSIS — Z23 Encounter for immunization: Secondary | ICD-10-CM | POA: Diagnosis not present

## 2021-04-18 DIAGNOSIS — Z111 Encounter for screening for respiratory tuberculosis: Secondary | ICD-10-CM | POA: Diagnosis not present

## 2021-04-22 ENCOUNTER — Other Ambulatory Visit: Payer: Self-pay

## 2021-04-22 ENCOUNTER — Encounter (HOSPITAL_COMMUNITY): Payer: Self-pay | Admitting: Emergency Medicine

## 2021-04-22 ENCOUNTER — Emergency Department (HOSPITAL_COMMUNITY)
Admission: EM | Admit: 2021-04-22 | Discharge: 2021-04-22 | Disposition: A | Discharge status: Home / Self Care | Payer: Medicaid Other | Attending: Emergency Medicine | Admitting: Emergency Medicine

## 2021-04-22 DIAGNOSIS — Z20822 Contact with and (suspected) exposure to covid-19: Secondary | ICD-10-CM | POA: Insufficient documentation

## 2021-04-22 DIAGNOSIS — R197 Diarrhea, unspecified: Secondary | ICD-10-CM | POA: Diagnosis not present

## 2021-04-22 LAB — RESP PANEL BY RT-PCR (FLU A&B, COVID) ARPGX2
Influenza A by PCR: NEGATIVE
Influenza B by PCR: NEGATIVE
SARS Coronavirus 2 by RT PCR: NEGATIVE

## 2021-04-22 LAB — CBC WITH DIFFERENTIAL/PLATELET
Abs Immature Granulocytes: 0.01 10*3/uL (ref 0.00–0.07)
Basophils Absolute: 0 10*3/uL (ref 0.0–0.1)
Basophils Relative: 0 %
Eosinophils Absolute: 0.2 10*3/uL (ref 0.0–0.5)
Eosinophils Relative: 3 %
HCT: 37.9 % (ref 36.0–46.0)
Hemoglobin: 12.3 g/dL (ref 12.0–15.0)
Immature Granulocytes: 0 %
Lymphocytes Relative: 31 %
Lymphs Abs: 2 10*3/uL (ref 0.7–4.0)
MCH: 29.2 pg (ref 26.0–34.0)
MCHC: 32.5 g/dL (ref 30.0–36.0)
MCV: 90 fL (ref 80.0–100.0)
Monocytes Absolute: 0.6 10*3/uL (ref 0.1–1.0)
Monocytes Relative: 9 %
Neutro Abs: 3.7 10*3/uL (ref 1.7–7.7)
Neutrophils Relative %: 57 %
Platelets: 295 10*3/uL (ref 150–400)
RBC: 4.21 MIL/uL (ref 3.87–5.11)
RDW: 13.7 % (ref 11.5–15.5)
WBC: 6.5 10*3/uL (ref 4.0–10.5)
nRBC: 0 % (ref 0.0–0.2)

## 2021-04-22 LAB — COMPREHENSIVE METABOLIC PANEL
ALT: 64 U/L — ABNORMAL HIGH (ref 0–44)
AST: 29 U/L (ref 15–41)
Albumin: 4 g/dL (ref 3.5–5.0)
Alkaline Phosphatase: 77 U/L (ref 38–126)
Anion gap: 6 (ref 5–15)
BUN: 16 mg/dL (ref 6–20)
CO2: 25 mmol/L (ref 22–32)
Calcium: 9.1 mg/dL (ref 8.9–10.3)
Chloride: 112 mmol/L — ABNORMAL HIGH (ref 98–111)
Creatinine, Ser: 0.81 mg/dL (ref 0.44–1.00)
GFR, Estimated: >60 mL/min (ref >60)
Glucose, Bld: 96 mg/dL (ref 70–99)
Potassium: 3.6 mmol/L (ref 3.5–5.1)
Sodium: 143 mmol/L (ref 135–145)
Total Bilirubin: 0.6 mg/dL (ref 0.3–1.2)
Total Protein: 7.9 g/dL (ref 6.5–8.1)

## 2021-04-22 LAB — I-STAT BETA HCG BLOOD, ED (MC, WL, AP ONLY): I-stat hCG, quantitative: <5 m[IU]/mL (ref <5)

## 2021-04-22 MED ORDER — AZITHROMYCIN 250 MG PO TABS: 250.0000 mg | ORAL_TABLET | Freq: Every day | ORAL | 0 refills | Status: DC

## 2021-04-22 MED ORDER — LACTATED RINGERS IV BOLUS
1000.0000 mL | Freq: Once | INTRAVENOUS | Status: AC
  Administered 2021-04-22: 1000 mL via INTRAVENOUS

## 2021-04-22 MED ORDER — LOPERAMIDE HCL 2 MG PO CAPS: 2.0000 mg | ORAL_CAPSULE | Freq: Four times a day (QID) | ORAL | 0 refills | Status: DC | PRN

## 2021-04-22 NOTE — ED Notes (Signed)
Attempted to get stool sample from pt. Pt states she is unable to provide sample at this time.

## 2021-04-22 NOTE — ED Provider Notes (Signed)
[facility] Provider Note   CSN: [id number] Arrival date & time: 04/22/21  0807     History Chief Complaint  Patient presents with   Diarrhea    Leah [name] is a 25 y.o. female.  HPI     25yo female presents with concern for diarrhea.  Has been happening since Thursday, every 30 minutes, watery. No black or bloody stools. Some cramping pain with diarrhea but otherwise no abdominal pain, no nausea, or vomiting. No urinary symptoms, no fevers. No recent antibiotics, travel, sick contacts, suspicious foods.    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   Suicide attempt by drug ingestion (HCC)    Anemia, iron deficiency 12/01/2012    Past Surgical History:  Procedure Laterality Date   CHOLECYSTECTOMY N/A 09/28/2014   Procedure: LAPAROSCOPIC CHOLECYSTECTOMY;  Surgeon: Axel Filler, MD;  Location: [facility] OR;  Service: General;  Laterality: N/A;   DIRECT LARYNGOSCOPY N/A 04/24/2014   Procedure: DIRECT LARYNGOSCOPY;  Surgeon: [name], MD;  Location: [facility] OR;  Service: ENT;  Laterality: N/A;   FOREIGN BODY REMOVAL ESOPHAGEAL N/A 04/24/2014   Procedure: REMOVAL FOREIGN BODY ESOPHAGEAL;  Surgeon: [name], MD;  Location: [facility];  Service: ENT;  Laterality: N/A;   WISDOM TOOTH EXTRACTION  2013     OB History     Gravida  2   Para  2   Term  2   Preterm  0   AB  0   Living  2      SAB  0   IAB  0   Ectopic  0   Multiple  0   Live Births  2           Family History  Problem Relation Age of Onset   Arthritis Mother    Bronchitis  Mother    Asthma Mother    Hearing loss Paternal Grandfather    Diabetes Neg Hx    Stomach cancer Neg Hx    Colon cancer Neg Hx     Social History   Tobacco Use   Smoking status: Never   Smokeless tobacco: Never   Tobacco comments:    marjiuna  Vaping Use   Vaping Use: Never used  Substance Use Topics   Alcohol use: Not Currently    Alcohol/week: 6.0 standard drinks    Types: 6 Cans of beer per week   Drug use: Not Currently    Types: Marijuana    Comment: last used before pregnancy    Home Medications Prior to Admission medications   Medication Sig Start Date End Date Taking? Authorizing Provider  Aspirin-Acetaminophen-Caffeine (GOODY HEADACHE PO) Take 1 packet by mouth 2 (two) times daily as needed (headache/pain).   Yes [provider]  azithromycin (ZITHROMAX) 250 MG tablet Take 1 tablet (250 mg total) by mouth daily. Take first 2 tablets together, then 1 every day until finished. 04/22/21  Yes Alvira Monday, MD  loperamide (IMODIUM) 2 MG capsule Take 1 capsule (2 mg total) by mouth 4 (four) times daily as  needed for diarrhea or loose stools. 04/22/21  Yes Alvira Monday, MD  metroNIDAZOLE (METROGEL VAGINAL) 0.75 % vaginal gel Insert 1 applicatorful nightly x 10 nights then twice a week for 6 months 03/06/21  Yes Walker, Jamilla R, CNM  OLANZapine (ZYPREXA) 5 MG tablet Take 1 tablet (5 mg total) by mouth at bedtime. 04/05/21  Yes Toy Cookey E, NP  ondansetron (ZOFRAN ODT) 4 MG disintegrating tablet Take 1 tablet (4 mg total) by mouth every 8 (eight) hours as needed for nausea or vomiting. Patient not taking: No sig reported 03/08/21   Mannie Stabile, PA-C    Allergies    Bactrim [sulfamethoxazole-trimethoprim]  Review of Systems   Review of Systems  Constitutional:  Negative for fever.  HENT:  Negative for sore throat.   Eyes:  Negative for visual disturbance.  Respiratory:  Negative for cough and shortness of breath.   Cardiovascular:  Negative  for chest pain.  Gastrointestinal:  Positive for diarrhea. Negative for abdominal pain, nausea and vomiting.  Genitourinary:  Negative for difficulty urinating.  Musculoskeletal:  Negative for back pain and neck pain.  Skin:  Negative for rash.  Neurological:  Negative for syncope and headaches.   Physical Exam Updated Vital Signs BP (!) 142/98   Pulse 82   Temp 98.5 F (36.9 C) (Oral)   Resp 19   Ht 5' (1.524 m)   Wt 72.6 kg   SpO2 98%   BMI 31.25 kg/m   Physical Exam Vitals and nursing note reviewed.  Constitutional:      General: She is not in acute distress.    Appearance: She is well-developed. She is not diaphoretic.  HENT:     Head: Normocephalic and atraumatic.  Eyes:     Conjunctiva/sclera: Conjunctivae normal.  Cardiovascular:     Rate and Rhythm: Normal rate and regular rhythm.  Pulmonary:     Effort: Pulmonary effort is normal. No respiratory distress.  Abdominal:     General: There is no distension.     Palpations: Abdomen is soft.     Tenderness: There is no abdominal tenderness. There is no guarding.  Musculoskeletal:        General: No tenderness.     Cervical back: Normal range of motion.  Skin:    General: Skin is warm and dry.     Findings: No erythema or rash.  Neurological:     Mental Status: She is alert and oriented to person, place, and time.    ED Results / Procedures / Treatments   Labs (all labs ordered are listed, but only abnormal results are displayed) Labs Reviewed  COMPREHENSIVE METABOLIC PANEL - Abnormal; Notable for the following components:      Result Value   Chloride 112 (*)    ALT 64 (*)    All other components within normal limits  RESP PANEL BY RT-PCR (FLU A&B, COVID) [id number]  GASTROINTESTINAL PANEL BY PCR, STOOL (REPLACES STOOL CULTURE)  CBC WITH DIFFERENTIAL/PLATELET  I-STAT BETA HCG BLOOD, ED ([facility], [facility], [facility] ONLY)    EKG None  Radiology No results found.  Procedures Procedures   Medications Ordered in  ED Medications  lactated ringers bolus 1,000 mL (0 mLs Intravenous Stopped 04/22/21 1042)    ED Course  I have reviewed the triage vital signs and the nursing notes.  Pertinent labs & imaging results that were available during my care of the patient were reviewed by me and considered in my medical decision making (see chart for details).  MDM Rules/Calculators/A&P                            25yo female presents with concern for diarrhea. No significant electrolyte abnormalities. Given IV fluids for rehydration. Abdominal exam benign, doubt diverticulitis, appendicitis, SBO, ischemic colitis.   Suspect viral vs bacterial gastroenteritis.  Low suspicion for cdiff with no risk factors, normal WBC.Given rx for imodium, azithromycin if symptoms continue. Recommend PCP follow up. Patient discharged in stable condition with understanding of reasons to return.       Final Clinical Impression(s) / ED Diagnoses Final diagnoses:  Diarrhea of presumed infectious origin    Rx / DC Orders ED Discharge Orders          Ordered    loperamide (IMODIUM) 2 MG capsule  4 times daily PRN        04/22/21 1026    azithromycin (ZITHROMAX) 250 MG tablet  Daily        04/22/21 1026             Alvira Monday, MD 04/22/21 2343

## 2021-04-22 NOTE — Discharge Instructions (Addendum)
I have given you a prescription for loperamide (you can also get this over the counter) and also azithromycin if your symptoms do not improve in the next 4 days.  If you have fever, severe abdominal pain, nausea and vomiting and can't keep things down, return to the ED. If you have continuing diarrhea, see your PCP and you may need stool testing and or a referral to GI.

## 2021-04-22 NOTE — ED Triage Notes (Signed)
Arrived via POV with c/o diarrhea. Pt reports she has been having uncontrollable diarrhea since Thursday with episodes happening every 15-30 minutes. Denies nausea, vomiting and taking any OTC medication.

## 2021-04-25 DIAGNOSIS — F319 Bipolar disorder, unspecified: Secondary | ICD-10-CM | POA: Diagnosis not present

## 2021-05-01 ENCOUNTER — Other Ambulatory Visit (HOSPITAL_COMMUNITY)
Admission: RE | Admit: 2021-05-01 | Discharge: 2021-05-01 | Disposition: A | Discharge status: Home / Self Care | Payer: Medicaid Other | Source: Ambulatory Visit | Attending: Family Medicine | Admitting: Family Medicine

## 2021-05-01 ENCOUNTER — Other Ambulatory Visit: Payer: Self-pay

## 2021-05-01 ENCOUNTER — Ambulatory Visit (INDEPENDENT_AMBULATORY_CARE_PROVIDER_SITE_OTHER): Payer: Medicaid Other

## 2021-05-01 VITALS — BP 114/68 | HR 75 | Wt 164.1 lb

## 2021-05-01 DIAGNOSIS — Z113 Encounter for screening for infections with a predominantly sexual mode of transmission: Secondary | ICD-10-CM | POA: Insufficient documentation

## 2021-05-01 DIAGNOSIS — N898 Other specified noninflammatory disorders of vagina: Secondary | ICD-10-CM

## 2021-05-01 NOTE — Progress Notes (Signed)
Here for STD screening. Patient reports recurrent BV; currently taking Metrogel twice weekly. Reports some foul smelling vaginal discharge, but is unsure if this is due to antibiotic use. Vaginal self swab instructions given and specimen obtained. Will contact pt with any abnormal results.  PHQ-9 and GAD-7 negative today; pt does endorse some thoughts of harm/SI within the past two weeks. Pt reports regular follow up with counselor at [facility]. Reviewed emergency resources with patient. Encouraged pt to present immediately to behavioral health urgent care if she is concerned she may harm herself. Pt declines follow up visit with our [facility] provider.  Fleet Contras RN 05/01/21

## 2021-05-02 LAB — CERVICOVAGINAL ANCILLARY ONLY
Bacterial Vaginitis (gardnerella): NEGATIVE
Candida Glabrata: NEGATIVE
Candida Vaginitis: POSITIVE — AB
Chlamydia: NEGATIVE
Comment: NEGATIVE
Comment: NEGATIVE
Comment: NEGATIVE
Comment: NEGATIVE
Comment: NEGATIVE
Comment: NORMAL
Neisseria Gonorrhea: NEGATIVE
Trichomonas: NEGATIVE

## 2021-05-03 ENCOUNTER — Emergency Department (HOSPITAL_COMMUNITY)
Admission: EM | Admit: 2021-05-03 | Discharge: 2021-05-03 | Disposition: A | Discharge status: Home / Self Care | Payer: Medicaid Other | Attending: Emergency Medicine | Admitting: Emergency Medicine

## 2021-05-03 ENCOUNTER — Encounter (HOSPITAL_COMMUNITY): Payer: Self-pay | Admitting: Emergency Medicine

## 2021-05-03 ENCOUNTER — Other Ambulatory Visit: Payer: Self-pay

## 2021-05-03 DIAGNOSIS — J069 Acute upper respiratory infection, unspecified: Secondary | ICD-10-CM | POA: Insufficient documentation

## 2021-05-03 DIAGNOSIS — U071 COVID-19: Secondary | ICD-10-CM | POA: Diagnosis not present

## 2021-05-03 DIAGNOSIS — J029 Acute pharyngitis, unspecified: Secondary | ICD-10-CM | POA: Diagnosis present

## 2021-05-03 DIAGNOSIS — B9789 Other viral agents as the cause of diseases classified elsewhere: Secondary | ICD-10-CM | POA: Diagnosis not present

## 2021-05-03 LAB — CBC WITH DIFFERENTIAL/PLATELET
Abs Immature Granulocytes: 0.03 10*3/uL (ref 0.00–0.07)
Basophils Absolute: 0 10*3/uL (ref 0.0–0.1)
Basophils Relative: 0 %
Eosinophils Absolute: 0.2 10*3/uL (ref 0.0–0.5)
Eosinophils Relative: 1 %
HCT: 42 % (ref 36.0–46.0)
Hemoglobin: 13.5 g/dL (ref 12.0–15.0)
Immature Granulocytes: 0 %
Lymphocytes Relative: 18 %
Lymphs Abs: 2.2 10*3/uL (ref 0.7–4.0)
MCH: 29.3 pg (ref 26.0–34.0)
MCHC: 32.1 g/dL (ref 30.0–36.0)
MCV: 91.1 fL (ref 80.0–100.0)
Monocytes Absolute: 0.5 10*3/uL (ref 0.1–1.0)
Monocytes Relative: 4 %
Neutro Abs: 9 10*3/uL — ABNORMAL HIGH (ref 1.7–7.7)
Neutrophils Relative %: 77 %
Platelets: 342 10*3/uL (ref 150–400)
RBC: 4.61 MIL/uL (ref 3.87–5.11)
RDW: 13.6 % (ref 11.5–15.5)
WBC: 11.9 10*3/uL — ABNORMAL HIGH (ref 4.0–10.5)
nRBC: 0 % (ref 0.0–0.2)

## 2021-05-03 LAB — LIPASE, BLOOD: Lipase: 30 U/L (ref 11–51)

## 2021-05-03 LAB — COMPREHENSIVE METABOLIC PANEL
ALT: 29 U/L (ref 0–44)
AST: 23 U/L (ref 15–41)
Albumin: 4.6 g/dL (ref 3.5–5.0)
Alkaline Phosphatase: 80 U/L (ref 38–126)
Anion gap: 9 (ref 5–15)
BUN: 17 mg/dL (ref 6–20)
CO2: 25 mmol/L (ref 22–32)
Calcium: 9.4 mg/dL (ref 8.9–10.3)
Chloride: 106 mmol/L (ref 98–111)
Creatinine, Ser: 0.83 mg/dL (ref 0.44–1.00)
GFR, Estimated: >60 mL/min (ref >60)
Glucose, Bld: 98 mg/dL (ref 70–99)
Potassium: 3.7 mmol/L (ref 3.5–5.1)
Sodium: 140 mmol/L (ref 135–145)
Total Bilirubin: 0.5 mg/dL (ref 0.3–1.2)
Total Protein: 8.8 g/dL — ABNORMAL HIGH (ref 6.5–8.1)

## 2021-05-03 LAB — I-STAT BETA HCG BLOOD, ED (MC, WL, AP ONLY): I-stat hCG, quantitative: <5 m[IU]/mL (ref <5)

## 2021-05-03 LAB — RESP PANEL BY RT-PCR (FLU A&B, COVID) ARPGX2
Influenza A by PCR: NEGATIVE
Influenza B by PCR: NEGATIVE
SARS Coronavirus 2 by RT PCR: POSITIVE — AB

## 2021-05-03 LAB — GROUP A STREP BY PCR: Group A Strep by PCR: NOT DETECTED

## 2021-05-03 MED ORDER — IBUPROFEN 800 MG PO TABS
800.0000 mg | ORAL_TABLET | Freq: Once | ORAL | Status: AC
  Administered 2021-05-03: 800 mg via ORAL
  Filled 2021-05-03: qty 1

## 2021-05-03 MED ORDER — ONDANSETRON 4 MG PO TBDP
4.0000 mg | ORAL_TABLET | Freq: Once | ORAL | Status: AC
  Administered 2021-05-03: 4 mg via ORAL
  Filled 2021-05-03: qty 1

## 2021-05-03 MED ORDER — BENZONATATE 100 MG PO CAPS: 100.0000 mg | ORAL_CAPSULE | Freq: Three times a day (TID) | ORAL | 0 refills | Status: DC

## 2021-05-03 NOTE — Discharge Instructions (Signed)
You were seen in the emergency department today for an upper respiratory infection.  You do not have strep throat so did not require antibiotic treatment.  You opted to leave to pick up your daughter prior to getting the rest of your lab results are back.  Somebody will call you if you are positive for COVID.  In the meantime please use Tylenol and ibuprofen as needed for your symptoms.  He can also use over-the-counter cough and cold medications.  I prescribed you Tessalon which is a cough medication that you can use for symptomatic relief of your symptoms.  Please return to emergency department if you have increasing shortness of breath or fevers that do not respond to Tylenol.

## 2021-05-03 NOTE — ED Notes (Signed)
Pt ambulatory in ED lobby. 

## 2021-05-03 NOTE — ED Provider Notes (Signed)
[facility]-EMERGENCY DEPT Provider Note   CSN: [id number] Arrival date & time: 05/03/21  1514     History Chief Complaint  Patient presents with   Nausea   Emesis   Sore Throat   Cough    Leah [name] is a 25 y.o. female.  With past medical history of GERD who presents emergency department with sore throat and nausea.  She states that yesterday she began having a sore throat and then cough.  States that this morning she woke up and felt nauseated and had an episode of emesis at about 2 PM.  She denies fevers, sputum production, shortness of breath or chest pain.  She denies sinus pressure or pain, ear pain, new rashes.  She denies abdominal pain, diarrhea.   Emesis Associated symptoms: cough and sore throat   Associated symptoms: no fever   Sore Throat Pertinent negatives include no chest pain and no shortness of breath.  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   Suicide attempt by drug ingestion (HCC)    Anemia, iron deficiency 12/01/2012    Past Surgical History:  Procedure Laterality Date   CHOLECYSTECTOMY N/A 09/28/2014   Procedure: LAPAROSCOPIC CHOLECYSTECTOMY;  Surgeon: Axel Filler, MD;  Location: [facility] OR;  Service: General;  Laterality: N/A;   DIRECT LARYNGOSCOPY N/A 04/24/2014   Procedure: DIRECT LARYNGOSCOPY;  Surgeon: [name], MD;  Location: [facility] OR;  Service: ENT;  Laterality: N/A;   FOREIGN BODY REMOVAL ESOPHAGEAL N/A  04/24/2014   Procedure: REMOVAL FOREIGN BODY ESOPHAGEAL;  Surgeon: [name], MD;  Location: [facility] OR;  Service: ENT;  Laterality: N/A;   WISDOM TOOTH EXTRACTION  2013     OB History     Gravida  2   Para  2   Term  2   Preterm  0   AB  0   Living  2      SAB  0   IAB  0   Ectopic  0   Multiple  0   Live Births  2           Family History  Problem Relation Age of Onset   Arthritis Mother    Bronchitis Mother    Asthma Mother    Hearing loss Paternal Grandfather    Diabetes Neg Hx    Stomach cancer Neg Hx    Colon cancer Neg Hx     Social History   Tobacco Use   Smoking status: Never   Smokeless tobacco: Never   Tobacco comments:    marjiuna  Vaping Use   Vaping Use: Never used  Substance Use Topics   Alcohol use: Not Currently    Alcohol/week: 6.0 standard drinks    Types: 6 Cans of beer per week   Drug use: Not Currently    Types: Marijuana    Comment: last used before pregnancy    Home  Medications Prior to Admission medications   Medication Sig Start Date End Date Taking? Authorizing Provider  metroNIDAZOLE (METROGEL VAGINAL) 0.75 % vaginal gel Insert 1 applicatorful nightly x 10 nights then twice a week for 6 months 03/06/21   Edd Arbour R, CNM  OLANZapine (ZYPREXA) 5 MG tablet Take 1 tablet (5 mg total) by mouth at bedtime. 04/05/21   Shanna Cisco, NP    Allergies    Bactrim [sulfamethoxazole-trimethoprim]  Review of Systems   Review of Systems  Constitutional:  Negative for fever.  HENT:  Positive for sore throat. Negative for sinus pressure and sinus pain.   Respiratory:  Positive for cough. Negative for shortness of breath.   Cardiovascular:  Negative for chest pain.  Gastrointestinal:  Positive for nausea and vomiting.  Skin:  Negative for rash.  All other systems reviewed and are negative.  Physical Exam Updated Vital Signs BP 139/89 (BP Location: Left Arm)   Pulse (!) 114   Temp 98.3 F (36.8 C) (Oral)   Resp  18   SpO2 100%   Breastfeeding Unknown   Physical Exam Vitals and nursing note reviewed.  Constitutional:      General: She is not in acute distress.    Appearance: Normal appearance. She is well-developed. She is not toxic-appearing.  HENT:     Head: Normocephalic and atraumatic.     Nose: No congestion.     Mouth/Throat:     Mouth: Mucous membranes are moist.     Pharynx: Uvula midline. Posterior oropharyngeal erythema present. No pharyngeal swelling or oropharyngeal exudate.     Tonsils: No tonsillar exudate or tonsillar abscesses.  Eyes:     General: No scleral icterus.    Conjunctiva/sclera: Conjunctivae normal.     Pupils: Pupils are equal, round, and reactive to light.  Cardiovascular:     Rate and Rhythm: Normal rate and regular rhythm.     Heart sounds: Normal heart sounds. No murmur heard. Pulmonary:     Effort: Pulmonary effort is normal. No respiratory distress.     Breath sounds: Normal breath sounds.  Abdominal:     General: Bowel sounds are normal.     Palpations: Abdomen is soft.  Musculoskeletal:     Cervical back: Neck supple.  Lymphadenopathy:     Cervical: No cervical adenopathy.  Skin:    General: Skin is warm and dry.     Capillary Refill: Capillary refill takes less than 2 seconds.  Neurological:     General: No focal deficit present.     Mental Status: She is alert and oriented to person, place, and time.  Psychiatric:        Mood and Affect: Mood normal.        Behavior: Behavior normal.    ED Results / Procedures / Treatments   Labs (all labs ordered are listed, but only abnormal results are displayed) Labs Reviewed  CBC WITH DIFFERENTIAL/PLATELET - Abnormal; Notable for the following components:      Result Value   WBC 11.9 (*)    Neutro Abs 9.0 (*)    All other components within normal limits  COMPREHENSIVE METABOLIC PANEL - Abnormal; Notable for the following components:   Total Protein 8.8 (*)    All other components within normal  limits  GROUP A STREP BY PCR  RESP PANEL BY RT-PCR (FLU A&B, COVID) ARPGX2  LIPASE, BLOOD  I-STAT BETA HCG BLOOD, ED ([facility], [facility], [facility] ONLY)    EKG None  Radiology No results  found.  Procedures Procedures   Medications Ordered in ED Medications  ondansetron (ZOFRAN-ODT) disintegrating tablet 4 mg (has no administration in time range)  ibuprofen (ADVIL) tablet 800 mg (has no administration in time range)    ED Course  I have reviewed the triage vital signs and the nursing notes.  Pertinent labs & imaging results that were available during my care of the patient were reviewed by me and considered in my medical decision making (see chart for details).  Patient asking if she can leave because she needs to pick up daughter. She is hemodynamically stable. I discussed that if her COVID is positive someone will be in touch with her and that she can use mychart as well to follow up on pending labs. I do not think pending labs will dictate her disposition at this point. She is hemodynamically stable and can be discharged at this time.  MDM Rules/Calculators/A&P 25 year old female presents emergency department with sore throat.  Immunocompromise.  She is nontoxic in appearance.  She is euvolemic with no trismus.  There is no airway compromise.  She has no change in voice, exudates or enlarged lymph nodes.  She is able to tolerate p.o.  She states that she has some sick close contacts that live with her.  And given her history and exam I have low suspicion that this is PTA, RPA or [place]'s angina. Negative for strep throat Mild leukocytosis to 11.9 Given Advil and Zofran with some relief See above in ED course but patient asking to leave to pick up daughter.  Discussed that if COVID is positive someone will be in touch with her.  She opted not to wait for the results of her COVID testing.  She is overall well-appearing and in no acute distress.  Her vital signs are stable.  She likely has viral  upper respiratory infection with laryngitis.  Discussed over-the-counter options for relief of symptoms.  Order Tessalon for her to take at home for excessive coughing.  Push fluids. will discharge Final Clinical Impression(s) / ED Diagnoses Final diagnoses:  Viral upper respiratory tract infection    Rx / DC Orders ED Discharge Orders          Ordered    benzonatate (TESSALON) 100 MG capsule  Every 8 hours        05/03/21 1853             Cristopher Peru, PA-C 05/03/21 2152    Gerhard Munch, MD 05/03/21 [phone]

## 2021-05-03 NOTE — ED Triage Notes (Signed)
Patient here from home reporting cough, n/v, sore throat, and body aches x2 days after being exposed to covid.

## 2021-05-03 NOTE — ED Provider Notes (Signed)
Emergency Medicine Provider Triage Evaluation Note  Leah [name] , a 25 y.o. female  was evaluated in triage.  Pt complains of sore throat, n/v and copugh x 2 days. No fever. Hx of strep. Throat pain is 9/10.  Review of Systems  Positive: Sore throat Negative: fever  Physical Exam  BP 139/89 (BP Location: Left Arm)   Pulse (!) 114   Temp 98.3 F (36.8 C) (Oral)   Resp 18   SpO2 100%  Gen:   Awake, no distress   Resp:  Normal effort  MSK:   Moves extremities without difficulty  Other:  Mild erythema pharynx  Medical Decision Making  Medically screening exam initiated at 4:07 PM.  Appropriate orders placed.  Leah [name] was informed that the remainder of the evaluation will be completed by another provider, this initial triage assessment does not replace that evaluation, and the importance of remaining in the ED until their evaluation is complete.  Work up [employer], [place], PA-C 05/03/21 1609    Charlynne Pander, MD 05/03/21 [phone]

## 2021-05-04 ENCOUNTER — Ambulatory Visit: Payer: Self-pay

## 2021-05-04 NOTE — Telephone Encounter (Signed)
Pt. Wanted to review quarantine requirements. Reviewed with pt. Verbalizes understanding.    Reason for Disposition  [1] COVID-19 diagnosed by positive lab test (e.g., PCR, rapid self-test kit) AND [2] mild symptoms (e.g., cough, fever, others) AND [5] no complications or SOB  Answer Assessment - Initial Assessment Questions 1. COVID-19 DIAGNOSIS: "Who made your COVID-19 diagnosis?" "Was it confirmed by a positive lab test or self-test?" If not diagnosed by a doctor (or NP/PA), ask "Are there lots of cases (community spread) where you live?" Note: See public health department website, if unsure.     Yes 2. COVID-19 EXPOSURE: "Was there any known exposure to COVID before the symptoms began?" CDC Definition of close contact: within 6 feet (2 meters) for a total of 15 minutes or more over a 24-hour period.      No 3. ONSET: "When did the COVID-19 symptoms start?"      Yesterday 4. WORST SYMPTOM: "What is your worst symptom?" (e.g., cough, fever, shortness of breath, muscle aches)     Cough, sore throat 5. COUGH: "Do you have a cough?" If Yes, ask: "How bad is the cough?"       Yes 6. FEVER: "Do you have a fever?" If Yes, ask: "What is your temperature, how was it measured, and when did it start?"     No 7. RESPIRATORY STATUS: "Describe your breathing?" (e.g., shortness of breath, wheezing, unable to speak)      No 8. BETTER-SAME-WORSE: "Are you getting better, staying the same or getting worse compared to yesterday?"  If getting worse, ask, "In what way?"     Same 9. HIGH RISK DISEASE: "Do you have any chronic medical problems?" (e.g., asthma, heart or lung disease, weak immune system, obesity, etc.)     No 10. VACCINE: "Have you had the COVID-19 vaccine?" If Yes, ask: "Which one, how many shots, when did you get it?"        11. BOOSTER: "Have you received your COVID-19 booster?" If Yes, ask: "Which one and when did you get it?"        12. PREGNANCY: "Is there any chance you are pregnant?"  "When was your last menstrual period?"       No 13. OTHER SYMPTOMS: "Do you have any other symptoms?"  (e.g., chills, fatigue, headache, loss of smell or taste, muscle pain, sore throat)       None 14. O2 SATURATION MONITOR:  "Do you use an oxygen saturation monitor (pulse oximeter) at home?" If Yes, ask "What is your reading (oxygen level) today?" "What is your usual oxygen saturation reading?" (e.g., 95%)       No  Protocols used: Coronavirus (COVID-19) Diagnosed or Suspected-A-AH

## 2021-05-07 NOTE — Progress Notes (Signed)
Chart reviewed for nurse visit. Agree with plan of care.   Marylene Land, CNM 05/07/2021 9:36 AM

## 2021-05-16 ENCOUNTER — Encounter (HOSPITAL_COMMUNITY): Payer: Self-pay

## 2021-05-16 ENCOUNTER — Emergency Department (HOSPITAL_COMMUNITY)
Admission: EM | Admit: 2021-05-16 | Discharge: 2021-05-16 | Disposition: A | Discharge status: Home / Self Care | Payer: Medicaid Other | Attending: Emergency Medicine | Admitting: Emergency Medicine

## 2021-05-16 DIAGNOSIS — Z8616 Personal history of COVID-19: Secondary | ICD-10-CM | POA: Diagnosis not present

## 2021-05-16 DIAGNOSIS — R051 Acute cough: Secondary | ICD-10-CM | POA: Diagnosis not present

## 2021-05-16 DIAGNOSIS — R059 Cough, unspecified: Secondary | ICD-10-CM | POA: Diagnosis present

## 2021-05-16 DIAGNOSIS — J029 Acute pharyngitis, unspecified: Secondary | ICD-10-CM | POA: Diagnosis not present

## 2021-05-16 DIAGNOSIS — Z20822 Contact with and (suspected) exposure to covid-19: Secondary | ICD-10-CM | POA: Diagnosis not present

## 2021-05-16 LAB — RESP PANEL BY RT-PCR (FLU A&B, COVID) ARPGX2
Influenza A by PCR: NEGATIVE
Influenza B by PCR: NEGATIVE
SARS Coronavirus 2 by RT PCR: NEGATIVE

## 2021-05-16 LAB — GROUP A STREP BY PCR: Group A Strep by PCR: NOT DETECTED

## 2021-05-16 NOTE — ED Provider Notes (Signed)
[facility]-EMERGENCY DEPT Provider Note   CSN: [id number] Arrival date & time: 05/16/21  0751     History Chief Complaint  Patient presents with   Cough    Leah [name] is a 25 y.o. female presents to the emergency department for evaluation of cough, sore throat, nasal congestion since yesterday.  She has tried [employer] and Mucinex without relief.  She denies any fever, chest pain, shortness of breath, abdominal pain, nausea, or vomiting.  Patient reports her son recently came home with a positive COVID contact.  She had COVID a little over 2 weeks ago, thinks she may have COVID again.  She is also requesting a strep test.  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   Suicide attempt by drug ingestion (HCC)    Anemia, iron deficiency 12/01/2012    Past Surgical History:  Procedure Laterality Date   CHOLECYSTECTOMY N/A 09/28/2014   Procedure: LAPAROSCOPIC CHOLECYSTECTOMY;  Surgeon: Axel Filler, MD;  Location: [facility] OR;  Service: General;  Laterality: N/A;   DIRECT LARYNGOSCOPY N/A 04/24/2014   Procedure: DIRECT LARYNGOSCOPY;  Surgeon: [name], MD;  Location: [facility] OR;  Service: ENT;  Laterality: N/A;   FOREIGN BODY REMOVAL ESOPHAGEAL N/A 04/24/2014   Procedure: REMOVAL FOREIGN BODY ESOPHAGEAL;  Surgeon: [name], MD;  Location: [facility] OR;   Service: ENT;  Laterality: N/A;   WISDOM TOOTH EXTRACTION  2013     OB History     Gravida  2   Para  2   Term  2   Preterm  0   AB  0   Living  2      SAB  0   IAB  0   Ectopic  0   Multiple  0   Live Births  2           Family History  Problem Relation Age of Onset   Arthritis Mother    Bronchitis Mother    Asthma Mother    Hearing loss Paternal Grandfather    Diabetes Neg Hx    Stomach cancer Neg Hx    Colon cancer Neg Hx     Social History   Tobacco Use   Smoking status: Never   Smokeless tobacco: Never   Tobacco comments:    marjiuna  Vaping Use   Vaping Use: Never used  Substance Use Topics   Alcohol use: Not Currently    Alcohol/week: 6.0 standard drinks    Types: 6 Cans of beer per week   Drug use: Not Currently    Types: Marijuana    Comment: last used before pregnancy    Home Medications Prior to Admission medications   Medication Sig Start Date End Date Taking? Authorizing Provider  benzonatate (TESSALON) 100 MG capsule Take 1  capsule (100 mg total) by mouth every 8 (eight) hours. 05/03/21   Cristopher Peru, PA-C  metroNIDAZOLE (METROGEL VAGINAL) 0.75 % vaginal gel Insert 1 applicatorful nightly x 10 nights then twice a week for 6 months 03/06/21   Edd Arbour R, CNM  OLANZapine (ZYPREXA) 5 MG tablet Take 1 tablet (5 mg total) by mouth at bedtime. 04/05/21   Shanna Cisco, NP    Allergies    Bactrim [sulfamethoxazole-trimethoprim]  Review of Systems   Review of Systems  Constitutional:  Negative for chills and fever.  HENT:  Positive for congestion, rhinorrhea and sore throat. Negative for dental problem, drooling, ear pain and trouble swallowing.   Eyes:  Negative for pain and visual disturbance.  Respiratory:  Positive for cough. Negative for shortness of breath.   Cardiovascular:  Negative for chest pain and palpitations.  Gastrointestinal:  Negative for abdominal pain and vomiting.  Genitourinary:  Negative for  dysuria and hematuria.  Musculoskeletal:  Negative for arthralgias and back pain.  Skin:  Negative for color change and rash.  Neurological:  Negative for seizures and syncope.  All other systems reviewed and are negative.  Physical Exam Updated Vital Signs BP 113/80 (BP Location: Left Arm)   Pulse 84   Temp 98.3 F (36.8 C) (Oral)   Resp 12   SpO2 98%   Physical Exam Vitals and nursing note reviewed.  Constitutional:      General: She is not in acute distress.    Appearance: She is well-developed. She is not toxic-appearing.  HENT:     Head: Normocephalic and atraumatic.     Right Ear: Tympanic membrane, ear canal and external ear normal.     Left Ear: Tympanic membrane, ear canal and external ear normal.     Ears:     Comments: Nontender mastoid    Nose: Congestion and rhinorrhea present.     Comments: Bilateral nasal turbinate erythema and edema with scant clear nasal discharge.    Mouth/Throat:     Mouth: Mucous membranes are moist.     Pharynx: Oropharynx is clear. No oropharyngeal exudate or posterior oropharyngeal erythema.     Comments: Airway patent. Uvula midline.  No pharyngeal erythema or exudate.  Normal tonsils. Eyes:     Conjunctiva/sclera: Conjunctivae normal.  Cardiovascular:     Rate and Rhythm: Normal rate and regular rhythm.     Heart sounds: No murmur heard. Pulmonary:     Effort: Pulmonary effort is normal. No respiratory distress.     Breath sounds: Normal breath sounds. No wheezing.     Comments: To auscultation bilaterally.  No respiratory distress, tripoding, accessory muscle use, nasal flaring, or cyanosis.  Patient speaking in full sentences with ease. Abdominal:     Palpations: Abdomen is soft.     Tenderness: There is no abdominal tenderness.  Musculoskeletal:     Cervical back: Neck supple.  Skin:    General: Skin is warm and dry.  Neurological:     Mental Status: She is alert.    ED Results / Procedures / Treatments   Labs (all  labs ordered are listed, but only abnormal results are displayed) Labs Reviewed  RESP PANEL BY RT-PCR (FLU A&B, COVID) ARPGX2  GROUP A STREP BY PCR    EKG None  Radiology No results found.  Procedures Procedures   Medications Ordered in ED Medications - No data to display  ED Course  I have reviewed the triage vital signs and the nursing notes.  Pertinent labs & imaging results that were available during my care of the patient were reviewed by me and considered in my medical decision making (see chart for details).  Patient is physical exam reassuring this is likely a viral illness.  Lungs are clear to auscultation.  No pharyngeal erythema.  TMs clear.  The patient recently had COVID a little over 2 weeks ago, but is requesting a COVID test as she thinks it may be COVID again.  Discussed with her that those chances are less likely and that she most likely will test positive for COVID given the recent time of her illness.  She has no pharyngeal edema and has a cough, mention this is less likely strep throat, but she is adamant that she would like a strep test.  Patient afebrile in the ER. VSS.  I personally reviewed the labs for this patient. Negative for COVID, flu, and strep.   Discussed results with patient.  Recommended supportive care with plenty of hydration and rest.  Recommended Robitussin-DM or any other cold and cough medicine.  Important to keep your throat hydrated using throat lozenges as well.  Strict return precautions given.  Patient agrees to plan. Patient is stable and being discharged home in good condition.   MDM Rules/Calculators/A&P                          Final Clinical Impression(s) / ED Diagnoses Final diagnoses:  Acute cough  Sore throat    Rx / DC Orders ED Discharge Orders     None        Achille Rich, PA-C 05/16/21 1009    Gwyneth Sprout, MD 05/16/21 1535

## 2021-05-16 NOTE — ED Triage Notes (Signed)
Pt arrived via POV, c/o cough, runny nose. States COVID positive 13 days ago.

## 2021-05-16 NOTE — Discharge Instructions (Addendum)
You were seen here today for evaluation of cough and sore throat. Your labs for COVID, Flu, and strep were negative. This is likely a viral illness. You can take over the counter cough and cold medication as needed. Be careful taking these with Tylenol as some already contain Tylenol. Stay well hydrated with fluids, mainly water. You can try lozenges to keep throat well hydrated and moist. If you have any concern, new or worsening symptom, please return to the nearest Emergency Room.

## 2021-05-19 ENCOUNTER — Emergency Department
Admission: EM | Admit: 2021-05-19 | Discharge: 2021-05-19 | Disposition: A | Discharge status: Home / Self Care | Payer: Medicaid Other | Attending: Emergency Medicine | Admitting: Emergency Medicine

## 2021-05-19 ENCOUNTER — Emergency Department: Payer: Medicaid Other

## 2021-05-19 DIAGNOSIS — R0689 Other abnormalities of breathing: Secondary | ICD-10-CM | POA: Diagnosis not present

## 2021-05-19 DIAGNOSIS — R079 Chest pain, unspecified: Secondary | ICD-10-CM | POA: Diagnosis not present

## 2021-05-19 DIAGNOSIS — R000 Tachycardia, unspecified: Secondary | ICD-10-CM | POA: Diagnosis not present

## 2021-05-19 DIAGNOSIS — R569 Unspecified convulsions: Secondary | ICD-10-CM | POA: Insufficient documentation

## 2021-05-19 DIAGNOSIS — R001 Bradycardia, unspecified: Secondary | ICD-10-CM | POA: Diagnosis not present

## 2021-05-19 DIAGNOSIS — R404 Transient alteration of awareness: Secondary | ICD-10-CM | POA: Diagnosis not present

## 2021-05-19 NOTE — ED Triage Notes (Signed)
T had 5 seizures today at work. EMS witnessed 2 sx enroute to hospital. Pt was given 2mg  Versed IV enroute. Pt arrived drowsy but arousable. Pt placed on monitor,

## 2021-05-19 NOTE — ED Provider Notes (Signed)
[facility]  ____________________________________________   Event Date/Time   First MD Initiated Contact with Patient 05/19/21 1924     (approximate)  I have reviewed the triage vital signs and the nursing notes.   HISTORY  Chief Complaint Seizures    HPI Leah [name] is a 25 y.o. 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  On route she had an additional seizure.  EMS describes it as violent shaking activity.  She was given 2 mg of IM Versed.  Patient is unable to provide any history at the time of my evaluation as she is not speaking.         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   Suicide attempt by drug ingestion (HCC)    Anemia, iron deficiency 12/01/2012    Past Surgical History:  Procedure Laterality Date   CHOLECYSTECTOMY N/A 09/28/2014   Procedure: LAPAROSCOPIC CHOLECYSTECTOMY;  Surgeon: Axel Filler, MD;  Location: [facility] OR;  Service: General;  Laterality: N/A;   DIRECT LARYNGOSCOPY N/A 04/24/2014   Procedure: DIRECT LARYNGOSCOPY;  Surgeon: [name], MD;  Location: [facility] OR;  Service: ENT;  Laterality: N/A;   FOREIGN BODY REMOVAL ESOPHAGEAL N/A 04/24/2014   Procedure: REMOVAL FOREIGN BODY ESOPHAGEAL;  Surgeon: [name], MD;  Location: [facility] OR;  Service: ENT;  Laterality: N/A;   WISDOM TOOTH EXTRACTION  2013    Prior to Admission medications    Medication Sig Start Date End Date Taking? Authorizing Provider  benzonatate (TESSALON) 100 MG capsule Take 1 capsule (100 mg total) by mouth every 8 (eight) hours. 05/03/21   Cristopher Peru, PA-C  metroNIDAZOLE (METROGEL VAGINAL) 0.75 % vaginal gel Insert 1 applicatorful nightly x 10 nights then twice a week for 6 months 03/06/21   Edd Arbour R, CNM  OLANZapine (ZYPREXA) 5 MG tablet Take 1 tablet (5 mg total) by mouth at bedtime. 04/05/21   Shanna Cisco, NP    Allergies Bactrim [sulfamethoxazole-trimethoprim]  Family History  Problem Relation Age of Onset   Arthritis Mother    Bronchitis Mother    Asthma Mother    Hearing loss Paternal Grandfather    Diabetes Neg Hx    Stomach cancer Neg Hx    Colon cancer Neg Hx     Social History Social History   Tobacco Use   Smoking status: Never   Smokeless tobacco: Never   Tobacco comments:    marjiuna  Vaping Use   Vaping Use: Never used  Substance Use Topics   Alcohol use: Not Currently    Alcohol/week: 6.0 standard drinks    Types: 6 Cans of beer per week   Drug use: Not Currently    Types: Marijuana    Comment: last used before pregnancy    Review of Systems   Review of Systems  Unable to perform ROS: Patient nonverbal  Physical Exam Updated Vital Signs BP 130/87   Pulse (!) 128   Resp (!) 26   SpO2 100%   Physical Exam Vitals and nursing note reviewed.  Constitutional:      General: She is not in acute distress.    Appearance: Normal appearance.  HENT:     Head: Normocephalic and atraumatic.  Eyes:     General: No scleral icterus.    Conjunctiva/sclera: Conjunctivae normal.  Cardiovascular:     Rate and Rhythm: Normal rate and regular rhythm.  Pulmonary:     Effort: Pulmonary effort is normal. No respiratory distress.     Breath sounds: No stridor.  Abdominal:     General: Abdomen is flat. There is no distension.     Tenderness: There is no abdominal tenderness. There is no guarding.   Musculoskeletal:        General: No deformity or signs of injury.     Cervical back: Normal range of motion.  Skin:    General: Skin is dry.     Coloration: Skin is not jaundiced or pale.  Neurological:     Mental Status: She is alert.     Comments: Patient is lying in bed with eyes closed, does not follow commands or answer questions  Psychiatric:        Mood and Affect: Mood normal.        Behavior: Behavior normal.     LABS (all labs ordered are listed, but only abnormal results are displayed)  Labs Reviewed  POC URINE PREG, ED   ____________________________________________  EKG  Sinus tachycardia normal axis normal intervals no acute ischemic changes ____________________________________________  RADIOLOGY Ky Barban, personally viewed and evaluated these images (plain radiographs) as part of my medical decision making, as well as reviewing the written report by the radiologist.  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  Unfortunate unable to get a history from the patient on arrival but per EMS had several witnessed seizure-like activities at home.  Grams of Versed with EMS for questionable seizure-like activity as well.  On arrival she is calm and lying in bed comfortably but does not write any verbal response.  I was then called to the room and patient was having what looked more like to me to be a pseudoseizure.  She had pelvic thrusting as well as fine symmetric movements of her upper and lower extremities.   During this time she was able to guard her face from her arm.  She was tachycardic during this but there is no incontinence.  At this time we will hold any further AEDs given I am not convinced that this was true seizure activity.  We will continue to monitor.  Her Accu-Chek was normal.  We will get a pregnancy test.  We will pursue additional testing if patient does not come around.  Patient remained stable after napping she awakened tells me that she does have pseudoseizures about twice per week and has never been diagnosed with seizures does not take any medications.  Denies any known exacerbating factors.  She does complain of some mild anterior  chest pain.  Chest x-ray obtained which is negative EKG is reassuring, my suspicion for PE is low.  He did have some tachycardia but this was in the setting of after having his had a pseudoseizure and this resolved.  She has no other risk factors for PE.  Patient refused the urine pregnant.  She is stable for discharge.      ____________________________________________   FINAL CLINICAL IMPRESSION(S) / ED DIAGNOSES  Final diagnoses:  None     ED Discharge Orders     None        Note:  This document was prepared using Dragon voice recognition software and may include unintentional dictation errors.    Georga Hacking, MD 05/19/21 2148

## 2021-05-19 NOTE — ED Notes (Addendum)
Assessment limited at this time as patient is resting asleep. Pt endorsing CP. Pt seizing for 3 seizures lasting 10-30 seconds each. MD at bedside to visualize pt seizure. Pt placed on cardiac monitor.

## 2021-05-23 ENCOUNTER — Encounter (HOSPITAL_COMMUNITY): Payer: Self-pay

## 2021-05-23 ENCOUNTER — Emergency Department (HOSPITAL_COMMUNITY): Payer: Medicaid Other

## 2021-05-23 ENCOUNTER — Emergency Department (HOSPITAL_COMMUNITY)
Admission: EM | Admit: 2021-05-23 | Discharge: 2021-05-23 | Disposition: A | Discharge status: Home / Self Care | Payer: Medicaid Other | Attending: Emergency Medicine | Admitting: Emergency Medicine

## 2021-05-23 ENCOUNTER — Other Ambulatory Visit: Payer: Self-pay

## 2021-05-23 DIAGNOSIS — R059 Cough, unspecified: Secondary | ICD-10-CM

## 2021-05-23 DIAGNOSIS — Z8616 Personal history of COVID-19: Secondary | ICD-10-CM | POA: Diagnosis not present

## 2021-05-23 DIAGNOSIS — Z20822 Contact with and (suspected) exposure to covid-19: Secondary | ICD-10-CM | POA: Insufficient documentation

## 2021-05-23 DIAGNOSIS — J09X2 Influenza due to identified novel influenza A virus with other respiratory manifestations: Secondary | ICD-10-CM | POA: Diagnosis not present

## 2021-05-23 DIAGNOSIS — Z5321 Procedure and treatment not carried out due to patient leaving prior to being seen by health care provider: Secondary | ICD-10-CM | POA: Insufficient documentation

## 2021-05-23 DIAGNOSIS — R509 Fever, unspecified: Secondary | ICD-10-CM | POA: Diagnosis not present

## 2021-05-23 LAB — BASIC METABOLIC PANEL
Anion gap: 6 (ref 5–15)
BUN: 11 mg/dL (ref 6–20)
CO2: 25 mmol/L (ref 22–32)
Calcium: 9.2 mg/dL (ref 8.9–10.3)
Chloride: 108 mmol/L (ref 98–111)
Creatinine, Ser: 0.7 mg/dL (ref 0.44–1.00)
GFR, Estimated: >60 mL/min (ref >60)
Glucose, Bld: 95 mg/dL (ref 70–99)
Potassium: 3.6 mmol/L (ref 3.5–5.1)
Sodium: 139 mmol/L (ref 135–145)

## 2021-05-23 LAB — CBC WITH DIFFERENTIAL/PLATELET
Abs Immature Granulocytes: 0.02 10*3/uL (ref 0.00–0.07)
Basophils Absolute: 0 10*3/uL (ref 0.0–0.1)
Basophils Relative: 0 %
Eosinophils Absolute: 0.3 10*3/uL (ref 0.0–0.5)
Eosinophils Relative: 4 %
HCT: 40.9 % (ref 36.0–46.0)
Hemoglobin: 12.9 g/dL (ref 12.0–15.0)
Immature Granulocytes: 0 %
Lymphocytes Relative: 21 %
Lymphs Abs: 1.6 10*3/uL (ref 0.7–4.0)
MCH: 28.9 pg (ref 26.0–34.0)
MCHC: 31.5 g/dL (ref 30.0–36.0)
MCV: 91.5 fL (ref 80.0–100.0)
Monocytes Absolute: 0.8 10*3/uL (ref 0.1–1.0)
Monocytes Relative: 11 %
Neutro Abs: 4.6 10*3/uL (ref 1.7–7.7)
Neutrophils Relative %: 64 %
Platelets: 321 10*3/uL (ref 150–400)
RBC: 4.47 MIL/uL (ref 3.87–5.11)
RDW: 13.5 % (ref 11.5–15.5)
WBC: 7.3 10*3/uL (ref 4.0–10.5)
nRBC: 0 % (ref 0.0–0.2)

## 2021-05-23 LAB — RESP PANEL BY RT-PCR (FLU A&B, COVID) ARPGX2
Influenza A by PCR: POSITIVE — AB
Influenza B by PCR: NEGATIVE
SARS Coronavirus 2 by RT PCR: NEGATIVE

## 2021-05-23 MED ORDER — ACETAMINOPHEN 325 MG PO TABS
650.0000 mg | ORAL_TABLET | Freq: Once | ORAL | Status: AC
  Administered 2021-05-23: 650 mg via ORAL
  Filled 2021-05-23: qty 2

## 2021-05-23 NOTE — ED Triage Notes (Signed)
Pt c/o nasal congestion, cough, fever x4 days.

## 2021-05-23 NOTE — ED Provider Notes (Signed)
RelatedEmergency Medicine Provider Triage Evaluation Note  Leah [name] , a 25 y.o. female  was evaluated in triage.  Pt complains of subjective fever, cough,, and fatigue over the last 5 days.  Has sick contacts at home.  No abdominal symptoms, no urinary symptoms.  Review of Systems  Positive:  Negative: See above   Physical Exam  BP 134/74 (BP Location: Left Arm)   Pulse (!) 112   Temp (!) 101.9 F (38.8 C) (Oral)   Resp 18   SpO2 95%  Gen:   Awake, no distress   Resp:  Normal effort  MSK:   Moves extremities without difficulty  Other:    Medical Decision Making  Medically screening exam initiated at 4:28 PM.  Appropriate orders placed.  [name] LAYANA KONKEL was informed that the remainder of the evaluation will be completed by another provider, this initial triage assessment does not replace that evaluation, and the importance of remaining in the ED until their evaluation is complete.     Honor Loh [place], PA-C 05/23/21 1629    Gerhard Munch, MD 05/23/21 [phone]

## 2021-05-24 ENCOUNTER — Emergency Department (HOSPITAL_COMMUNITY)
Admission: EM | Admit: 2021-05-24 | Discharge: 2021-05-24 | Disposition: A | Discharge status: Home / Self Care | Payer: Medicaid Other | Attending: Emergency Medicine | Admitting: Emergency Medicine

## 2021-05-24 ENCOUNTER — Encounter (HOSPITAL_COMMUNITY): Payer: Self-pay

## 2021-05-24 DIAGNOSIS — R0981 Nasal congestion: Secondary | ICD-10-CM | POA: Diagnosis present

## 2021-05-24 DIAGNOSIS — J101 Influenza due to other identified influenza virus with other respiratory manifestations: Secondary | ICD-10-CM | POA: Diagnosis not present

## 2021-05-24 DIAGNOSIS — J111 Influenza due to unidentified influenza virus with other respiratory manifestations: Secondary | ICD-10-CM | POA: Diagnosis not present

## 2021-05-24 MED ORDER — OSELTAMIVIR PHOSPHATE 75 MG PO CAPS: 75.0000 mg | ORAL_CAPSULE | Freq: Two times a day (BID) | ORAL | 0 refills | Status: AC

## 2021-05-24 MED ORDER — OSELTAMIVIR PHOSPHATE 75 MG PO CAPS
75.0000 mg | ORAL_CAPSULE | Freq: Once | ORAL | Status: AC
  Administered 2021-05-24: 75 mg via ORAL
  Filled 2021-05-24: qty 1

## 2021-05-24 NOTE — ED Notes (Signed)
This RN is waiting on tamiflu to come from pharm before discharging pt. Per pharm, med will be tubed soon.

## 2021-05-24 NOTE — ED Notes (Signed)
An After Visit Summary was printed and given to the patient. Discharge instructions given and no further questions at this time.  

## 2021-05-24 NOTE — ED Triage Notes (Signed)
Pt presents with c/o positive flu result. Pt reports that she left yesterday because of the wait but saw on mychart that her flu result was positive and she is here for medicine.

## 2021-05-24 NOTE — ED Provider Notes (Signed)
For [facility]-EMERGENCY DEPT Provider Note   CSN: [id number] Arrival date & time: 05/24/21  0973     History Chief Complaint  Patient presents with   Influenza    Leah [name] is a 25 y.o. female.  Who presents to the emergency department with influenza A.  Patient was here yesterday 05/23/2021 viral illness.  She was discharged and states she is on her MyChart that she was influenza positive.  She states that she is here for medication and a work note.  She has no further complaints.  Denies shortness of breath or chest pain.  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   Suicide attempt by drug ingestion (HCC)    Anemia, iron deficiency 12/01/2012    Past Surgical History:  Procedure Laterality Date   CHOLECYSTECTOMY N/A 09/28/2014   Procedure: LAPAROSCOPIC CHOLECYSTECTOMY;  Surgeon: Axel Filler, MD;  Location: [facility] OR;  Service: General;  Laterality: N/A;   DIRECT LARYNGOSCOPY N/A 04/24/2014   Procedure: DIRECT LARYNGOSCOPY;  Surgeon: [name], MD;  Location: [facility] OR;  Service: ENT;  Laterality: N/A;   FOREIGN BODY REMOVAL ESOPHAGEAL N/A 04/24/2014   Procedure: REMOVAL FOREIGN BODY ESOPHAGEAL;  Surgeon: [name], MD;  Location: [facility] OR;  Service: ENT;  Laterality: N/A;   WISDOM TOOTH EXTRACTION  2013     OB History     Gravida  2   Para  2   Term  2   Preterm  0   AB  0   Living  2      SAB  0   IAB  0   Ectopic  0   Multiple  0   Live Births  2            Family History  Problem Relation Age of Onset   Arthritis Mother    Bronchitis Mother    Asthma Mother    Hearing loss Paternal Grandfather    Diabetes Neg Hx    Stomach cancer Neg Hx    Colon cancer Neg Hx     Social History   Tobacco Use   Smoking status: Never   Smokeless tobacco: Never   Tobacco comments:    marjiuna  Vaping Use   Vaping Use: Never used  Substance Use Topics   Alcohol use: Not Currently    Alcohol/week: 6.0 standard drinks    Types: 6 Cans of beer per week   Drug use: Not Currently    Types: Marijuana    Comment: last used before pregnancy    Home Medications Prior to Admission medications   Medication Sig Start Date End Date Taking? Authorizing Provider  benzonatate (TESSALON) 100 MG capsule Take 1 capsule (100 mg total) by mouth every 8 (eight) hours. 05/03/21   Cristopher Peru, PA-C  metroNIDAZOLE (METROGEL VAGINAL) 0.75 % vaginal gel Insert 1 applicatorful nightly x 10 nights then twice a week for 6 months 03/06/21   Dan Humphreys,  Jamilla R, CNM  OLANZapine (ZYPREXA) 5 MG tablet Take 1 tablet (5 mg total) by mouth at bedtime. 04/05/21   Shanna Cisco, NP    Allergies    Bactrim [sulfamethoxazole-trimethoprim]  Review of Systems   Review of Systems  Constitutional:  Positive for fever.  HENT:  Positive for congestion.   Respiratory:  Positive for cough.   All other systems reviewed and are negative.  Physical Exam Updated Vital Signs BP 125/77 (BP Location: Right Arm)   Pulse (!) 102   Temp 99.5 F (37.5 C) (Oral)   Resp 20   SpO2 98%   Physical Exam Vitals and nursing note reviewed.  HENT:     Head: Normocephalic and atraumatic.  Eyes:     General: No scleral icterus. Pulmonary:     Effort: Pulmonary effort is normal. No respiratory distress.  Skin:    Findings: No rash.  Neurological:     General: No focal deficit present.     Mental Status: She is alert.  Psychiatric:        Mood and Affect: Mood normal.         Behavior: Behavior normal.        Thought Content: Thought content normal.        Judgment: Judgment normal.    ED Results / Procedures / Treatments   Labs (all labs ordered are listed, but only abnormal results are displayed) Labs Reviewed - No data to display  EKG None  Radiology DG Chest 2 View  Result Date: 05/23/2021 CLINICAL DATA:  Cough and fever x4 days. EXAM: CHEST - 2 VIEW COMPARISON:  May 19, 2021 FINDINGS: The heart size and mediastinal contours are within normal limits. Both lungs are clear. The visualized skeletal structures are unremarkable. IMPRESSION: No active cardiopulmonary disease. Electronically Signed   By: Aram Candela M.D.   On: 05/23/2021 17:43    Procedures Procedures   Medications Ordered in ED Medications  oseltamivir (TAMIFLU) capsule 75 mg (has no administration in time range)    ED Course  I have reviewed the triage vital signs and the nursing notes.  Pertinent labs & imaging results that were available during my care of the patient were reviewed by me and considered in my medical decision making (see chart for details).    MDM Rules/Calculators/A&P 25 year old female who presents emergency department for for medication and a work note.  Brief physical exam with no obvious distress or shortness of breath.  She is stable in appearance.  I will give her Tamiflu and work note.  Stable for discharge Final Clinical Impression(s) / ED Diagnoses Final diagnoses:  Influenza    Rx / DC Orders ED Discharge Orders          Ordered    oseltamivir (TAMIFLU) 75 MG capsule  Every 12 hours        05/24/21 1143             Cristopher Peru, PA-C 05/24/21 1207    Mancel Bale, MD 05/24/21 1827

## 2021-05-24 NOTE — Discharge Instructions (Addendum)
You are seen in the emergency department today after being diagnosed with influenza A yesterday.  You are likely, have symptoms for a number of days until you start improving.  While you were here we gave you Tamiflu.  I have given you prescription for Tamiflu that you will take twice a day for the next 4 days.  Please take Tylenol and ibuprofen as needed for your other symptoms.  You may also use over-the-counter cough and cold medication for symptomatic treatment.  Please report your primary care provider for any other needs regarding your flu unless you begin having significant shortness of breath or chest pain, fever that is not responsive to Tylenol or ibuprofen.

## 2021-06-01 DIAGNOSIS — K219 Gastro-esophageal reflux disease without esophagitis: Secondary | ICD-10-CM | POA: Diagnosis not present

## 2021-06-01 DIAGNOSIS — R29704 NIHSS score 4: Secondary | ICD-10-CM | POA: Diagnosis not present

## 2021-06-01 DIAGNOSIS — R471 Dysarthria and anarthria: Secondary | ICD-10-CM | POA: Diagnosis not present

## 2021-06-01 DIAGNOSIS — H538 Other visual disturbances: Secondary | ICD-10-CM | POA: Diagnosis not present

## 2021-06-01 DIAGNOSIS — R29702 NIHSS score 2: Secondary | ICD-10-CM | POA: Diagnosis not present

## 2021-06-01 DIAGNOSIS — Z20822 Contact with and (suspected) exposure to covid-19: Secondary | ICD-10-CM | POA: Diagnosis not present

## 2021-06-01 DIAGNOSIS — I63511 Cerebral infarction due to unspecified occlusion or stenosis of right middle cerebral artery: Secondary | ICD-10-CM | POA: Diagnosis not present

## 2021-06-01 DIAGNOSIS — R2990 Unspecified symptoms and signs involving the nervous system: Secondary | ICD-10-CM | POA: Diagnosis not present

## 2021-06-01 DIAGNOSIS — I639 Cerebral infarction, unspecified: Secondary | ICD-10-CM | POA: Diagnosis not present

## 2021-06-01 DIAGNOSIS — F3130 Bipolar disorder, current episode depressed, mild or moderate severity, unspecified: Secondary | ICD-10-CM | POA: Diagnosis not present

## 2021-06-01 DIAGNOSIS — R531 Weakness: Secondary | ICD-10-CM | POA: Diagnosis not present

## 2021-06-01 DIAGNOSIS — G4489 Other headache syndrome: Secondary | ICD-10-CM | POA: Diagnosis not present

## 2021-06-01 DIAGNOSIS — R200 Anesthesia of skin: Secondary | ICD-10-CM | POA: Diagnosis not present

## 2021-06-01 DIAGNOSIS — R29810 Facial weakness: Secondary | ICD-10-CM | POA: Diagnosis not present

## 2021-06-01 DIAGNOSIS — F445 Conversion disorder with seizures or convulsions: Secondary | ICD-10-CM | POA: Diagnosis not present

## 2021-06-01 DIAGNOSIS — R1312 Dysphagia, oropharyngeal phase: Secondary | ICD-10-CM | POA: Diagnosis not present

## 2021-06-01 DIAGNOSIS — R29818 Other symptoms and signs involving the nervous system: Secondary | ICD-10-CM | POA: Diagnosis not present

## 2021-06-02 DIAGNOSIS — R2990 Unspecified symptoms and signs involving the nervous system: Secondary | ICD-10-CM | POA: Diagnosis not present

## 2021-06-03 DIAGNOSIS — R531 Weakness: Secondary | ICD-10-CM | POA: Diagnosis not present

## 2021-06-03 DIAGNOSIS — R2990 Unspecified symptoms and signs involving the nervous system: Secondary | ICD-10-CM | POA: Diagnosis not present

## 2021-06-03 DIAGNOSIS — F447 Conversion disorder with mixed symptom presentation: Secondary | ICD-10-CM | POA: Diagnosis not present

## 2021-06-03 DIAGNOSIS — R1312 Dysphagia, oropharyngeal phase: Secondary | ICD-10-CM | POA: Diagnosis not present

## 2021-06-21 DIAGNOSIS — Z419 Encounter for procedure for purposes other than remedying health state, unspecified: Secondary | ICD-10-CM | POA: Diagnosis not present

## 2021-07-10 ENCOUNTER — Telehealth (HOSPITAL_COMMUNITY): Payer: Medicaid Other | Admitting: Psychiatry

## 2021-07-18 ENCOUNTER — Ambulatory Visit: Payer: Medicaid Other

## 2021-07-22 DIAGNOSIS — Z419 Encounter for procedure for purposes other than remedying health state, unspecified: Secondary | ICD-10-CM | POA: Diagnosis not present

## 2021-07-30 ENCOUNTER — Emergency Department (HOSPITAL_COMMUNITY)
Admission: EM | Admit: 2021-07-30 | Discharge: 2021-07-30 | Disposition: A | Discharge status: Home / Self Care | Payer: Medicaid Other | Attending: Emergency Medicine | Admitting: Emergency Medicine

## 2021-07-30 ENCOUNTER — Other Ambulatory Visit: Payer: Self-pay

## 2021-07-30 ENCOUNTER — Encounter (HOSPITAL_COMMUNITY): Payer: Self-pay

## 2021-07-30 ENCOUNTER — Emergency Department (HOSPITAL_COMMUNITY): Payer: Medicaid Other

## 2021-07-30 DIAGNOSIS — X58XXXA Exposure to other specified factors, initial encounter: Secondary | ICD-10-CM | POA: Diagnosis not present

## 2021-07-30 DIAGNOSIS — S6992XA Unspecified injury of left wrist, hand and finger(s), initial encounter: Secondary | ICD-10-CM | POA: Diagnosis present

## 2021-07-30 DIAGNOSIS — M79632 Pain in left forearm: Secondary | ICD-10-CM | POA: Diagnosis not present

## 2021-07-30 DIAGNOSIS — S52102A Unspecified fracture of upper end of left radius, initial encounter for closed fracture: Secondary | ICD-10-CM | POA: Insufficient documentation

## 2021-07-30 MED ORDER — IBUPROFEN 800 MG PO TABS: 800.0000 mg | ORAL_TABLET | Freq: Three times a day (TID) | ORAL | 0 refills | Status: DC | PRN

## 2021-07-30 MED ORDER — OXYCODONE-ACETAMINOPHEN 5-325 MG PO TABS
1.0000 | ORAL_TABLET | Freq: Once | ORAL | Status: AC
  Administered 2021-07-30: 1 via ORAL
  Filled 2021-07-30: qty 1

## 2021-07-30 MED ORDER — HYDROCODONE-ACETAMINOPHEN 5-325 MG PO TABS: 1.0000 | ORAL_TABLET | ORAL | 0 refills | Status: DC | PRN

## 2021-07-30 NOTE — ED Provider Notes (Signed)
[facility]-EMERGENCY DEPT Provider Note   CSN: [id number] Arrival date & time: 07/30/21  0753     History  Chief Complaint  Patient presents with   Arm Injury    Leah [name] is a 26 y.o. female.  The history is provided by the patient. No language interpreter was used.  Arm Injury Location:  Wrist Wrist location:  L wrist Injury: no   Pain details:    Quality:  Aching   Radiates to:  Does not radiate   Severity:  Moderate   Onset quality:  Gradual   Timing:  Constant   Progression:  Worsening Relieved by:  Nothing Worsened by:  Nothing Ineffective treatments:  None tried Associated symptoms: swelling       Home Medications Prior to Admission medications   Medication Sig Start Date End Date Taking? Authorizing Provider  benzonatate (TESSALON) 100 MG capsule Take 1 capsule (100 mg total) by mouth every 8 (eight) hours. 05/03/21   Cristopher Peru, PA-C  metroNIDAZOLE (METROGEL VAGINAL) 0.75 % vaginal gel Insert 1 applicatorful nightly x 10 nights then twice a week for 6 months 03/06/21   Edd Arbour R, CNM  OLANZapine (ZYPREXA) 5 MG tablet Take 1 tablet (5 mg total) by mouth at bedtime. 04/05/21   Shanna Cisco, NP      Allergies    Bactrim [sulfamethoxazole-trimethoprim]    Review of Systems   Review of Systems  Musculoskeletal:  Positive for joint swelling.  All other systems reviewed and are negative.  Physical Exam Updated Vital Signs BP 131/88 (BP Location: Right Arm)    Pulse (!) 126    Temp 98 F (36.7 C) (Oral)    Resp 18    Ht 5' (1.524 m)    Wt 73 kg    SpO2 96%    BMI 31.44 kg/m  Physical Exam Vitals reviewed.  HENT:     Head: Normocephalic.  Cardiovascular:     Rate and Rhythm: Normal rate.  Pulmonary:     Effort: Pulmonary effort is normal.  Musculoskeletal:        General: Swelling and tenderness present.     Comments: [name], forearm and elbow   Skin:    General: Skin is warm.  Neurological:      General: No focal deficit present.     Mental Status: She is alert.  Psychiatric:        Mood and Affect: Mood normal.    ED Results / Procedures / Treatments   Labs (all labs ordered are listed, but only abnormal results are displayed) Labs Reviewed - No data to display  EKG None  Radiology DG Forearm Left  Result Date: 07/30/2021 CLINICAL DATA:  Left forearm pain after injury. EXAM: LEFT FOREARM - 2 VIEW COMPARISON:  May 04, 2009. FINDINGS: Abnormal anterior and posterior fat pad displacement is noted suggesting underlying joint effusion. There is slight cortical regularity involving the proximal right radial head suggesting possible nondisplaced fracture. IMPRESSION: Slight cortical regularity involving the proximal right radial head suggesting nondisplaced fracture. Joint effusion is noted. Electronically Signed   By: Lupita Raider M.D.   On: 07/30/2021 08:47    Procedures Procedures    Medications Ordered in ED Medications  oxyCODONE-acetaminophen (PERCOCET/ROXICET) 5-325 MG per tablet 1 tablet (1 tablet Oral Given 07/30/21 3716)    ED Course/ Medical Decision Making/ A&P  Medical Decision Making  MDM  xray left forearm. Ordered, reviewed, interpreted and discussed with pt.          Final Clinical Impression(s) / ED Diagnoses Final diagnoses:  Closed fracture of proximal end of left radius, unspecified fracture morphology, initial encounter    Rx / DC Orders ED Discharge Orders          Ordered    HYDROcodone-acetaminophen (NORCO/VICODIN) 5-325 MG tablet  Every 4 hours PRN        07/30/21 0902    ibuprofen (ADVIL) 800 MG tablet  Every 8 hours PRN        07/30/21 0902          An After Visit Summary was printed and given to the patient.     Elson Areas, PA-C 07/30/21 5027    Rozelle Logan, DO 07/30/21 1503

## 2021-07-30 NOTE — Discharge Instructions (Addendum)
Schedule to see the Orthopaedist for evaltuion  

## 2021-07-30 NOTE — Progress Notes (Signed)
Orthopedic Tech Progress Note Patient Details:  Leah [name] [birth year] [id number]  Ortho Devices Type of Ortho Device: Arm sling, Long arm splint Ortho Device/Splint Location: left Ortho Device/Splint Interventions: Application   Post Interventions Patient Tolerated: Well Instructions Provided: Care of device  [name] 07/30/2021, 10:00 AM
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
## 2021-07-31 ENCOUNTER — Telehealth: Payer: Self-pay

## 2021-07-31 NOTE — Telephone Encounter (Signed)
Transition Care Management Follow-up Telephone Call Date of discharge and from where: 07/30/2021 from [facility] How have you been since you were released from the hospital? Pt stated that she was feeling okay and did not have any questions or concerns at this time.  Any questions or concerns? No  Items Reviewed: Did the pt receive and understand the discharge instructions provided? Yes  Medications obtained and verified? Yes  Other? No  Any new allergies since your discharge? No  Dietary orders reviewed? No Do you have support at home? Yes   Functional Questionnaire: (I = Independent and D = Dependent) ADLs: I  Bathing/Dressing- I  Meal Prep- I  Eating- I  Maintaining continence- I  Transferring/Ambulation- I  Managing Meds- I   Follow up appointments reviewed:  PCP Hospital f/u appt confirmed? No   Specialist Hospital f/u appt confirmed? Yes  Scheduled to see Ortho on 08/01/2021. Are transportation arrangements needed? No  If their condition worsens, is the pt aware to call PCP or go to the Emergency Dept.? Yes Was the patient provided with contact information for the PCP's office or ED? Yes Was to pt encouraged to call back with questions or concerns? Yes

## 2021-08-01 DIAGNOSIS — S52502A Unspecified fracture of the lower end of left radius, initial encounter for closed fracture: Secondary | ICD-10-CM | POA: Diagnosis not present

## 2021-08-07 ENCOUNTER — Telehealth (INDEPENDENT_AMBULATORY_CARE_PROVIDER_SITE_OTHER): Payer: Medicaid Other | Admitting: Psychiatry

## 2021-08-07 ENCOUNTER — Encounter (HOSPITAL_COMMUNITY): Payer: Self-pay | Admitting: Psychiatry

## 2021-08-07 DIAGNOSIS — F319 Bipolar disorder, unspecified: Secondary | ICD-10-CM

## 2021-08-07 MED ORDER — OLANZAPINE 5 MG PO TABS: 5.0000 mg | ORAL_TABLET | Freq: Every day | ORAL | 3 refills | Status: DC
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
## 2021-08-13 DIAGNOSIS — S52502D Unspecified fracture of the lower end of left radius, subsequent encounter for closed fracture with routine healing: Secondary | ICD-10-CM | POA: Diagnosis not present

## 2021-08-13 DIAGNOSIS — S42495A Other nondisplaced fracture of lower end of left humerus, initial encounter for closed fracture: Secondary | ICD-10-CM | POA: Diagnosis not present

## 2021-08-13 DIAGNOSIS — S52502A Unspecified fracture of the lower end of left radius, initial encounter for closed fracture: Secondary | ICD-10-CM | POA: Diagnosis not present

## 2021-08-13 DIAGNOSIS — S42402A Unspecified fracture of lower end of left humerus, initial encounter for closed fracture: Secondary | ICD-10-CM | POA: Insufficient documentation

## 2021-08-14 ENCOUNTER — Emergency Department (HOSPITAL_COMMUNITY): Payer: Medicaid Other

## 2021-08-14 ENCOUNTER — Encounter (HOSPITAL_COMMUNITY): Payer: Self-pay

## 2021-08-14 ENCOUNTER — Emergency Department (HOSPITAL_COMMUNITY)
Admission: EM | Admit: 2021-08-14 | Discharge: 2021-08-14 | Disposition: A | Discharge status: Home / Self Care | Payer: Medicaid Other | Attending: Emergency Medicine | Admitting: Emergency Medicine

## 2021-08-14 ENCOUNTER — Other Ambulatory Visit: Payer: Self-pay

## 2021-08-14 DIAGNOSIS — R531 Weakness: Secondary | ICD-10-CM | POA: Diagnosis not present

## 2021-08-14 DIAGNOSIS — R000 Tachycardia, unspecified: Secondary | ICD-10-CM | POA: Diagnosis not present

## 2021-08-14 DIAGNOSIS — Z743 Need for continuous supervision: Secondary | ICD-10-CM | POA: Diagnosis not present

## 2021-08-14 DIAGNOSIS — I69354 Hemiplegia and hemiparesis following cerebral infarction affecting left non-dominant side: Secondary | ICD-10-CM | POA: Insufficient documentation

## 2021-08-14 DIAGNOSIS — R29818 Other symptoms and signs involving the nervous system: Secondary | ICD-10-CM | POA: Diagnosis not present

## 2021-08-14 DIAGNOSIS — Z8673 Personal history of transient ischemic attack (TIA), and cerebral infarction without residual deficits: Secondary | ICD-10-CM | POA: Diagnosis not present

## 2021-08-14 DIAGNOSIS — R9431 Abnormal electrocardiogram [ECG] [EKG]: Secondary | ICD-10-CM | POA: Diagnosis not present

## 2021-08-14 LAB — COMPREHENSIVE METABOLIC PANEL
ALT: 25 U/L (ref 0–44)
AST: 21 U/L (ref 15–41)
Albumin: 4.2 g/dL (ref 3.5–5.0)
Alkaline Phosphatase: 78 U/L (ref 38–126)
Anion gap: 15 (ref 5–15)
BUN: 11 mg/dL (ref 6–20)
CO2: 21 mmol/L — ABNORMAL LOW (ref 22–32)
Calcium: 10 mg/dL (ref 8.9–10.3)
Chloride: 104 mmol/L (ref 98–111)
Creatinine, Ser: 0.79 mg/dL (ref 0.44–1.00)
GFR, Estimated: >60 mL/min (ref >60)
Glucose, Bld: 101 mg/dL — ABNORMAL HIGH (ref 70–99)
Potassium: 3.5 mmol/L (ref 3.5–5.1)
Sodium: 140 mmol/L (ref 135–145)
Total Bilirubin: 0.6 mg/dL (ref 0.3–1.2)
Total Protein: 7.9 g/dL (ref 6.5–8.1)

## 2021-08-14 LAB — I-STAT CHEM 8, ED
BUN: 10 mg/dL (ref 6–20)
Calcium, Ion: 1.19 mmol/L (ref 1.15–1.40)
Chloride: 107 mmol/L (ref 98–111)
Creatinine, Ser: 0.5 mg/dL (ref 0.44–1.00)
Glucose, Bld: 100 mg/dL — ABNORMAL HIGH (ref 70–99)
HCT: 40 % (ref 36.0–46.0)
Hemoglobin: 13.6 g/dL (ref 12.0–15.0)
Potassium: 3.6 mmol/L (ref 3.5–5.1)
Sodium: 141 mmol/L (ref 135–145)
TCO2: 23 mmol/L (ref 22–32)

## 2021-08-14 LAB — DIFFERENTIAL
Abs Immature Granulocytes: 0.03 10*3/uL (ref 0.00–0.07)
Basophils Absolute: 0 10*3/uL (ref 0.0–0.1)
Basophils Relative: 0 %
Eosinophils Absolute: 0 10*3/uL (ref 0.0–0.5)
Eosinophils Relative: 0 %
Immature Granulocytes: 0 %
Lymphocytes Relative: 25 %
Lymphs Abs: 2.5 10*3/uL (ref 0.7–4.0)
Monocytes Absolute: 0.5 10*3/uL (ref 0.1–1.0)
Monocytes Relative: 5 %
Neutro Abs: 6.8 10*3/uL (ref 1.7–7.7)
Neutrophils Relative %: 70 %

## 2021-08-14 LAB — CBG MONITORING, ED: Glucose-Capillary: 103 mg/dL — ABNORMAL HIGH (ref 70–99)

## 2021-08-14 LAB — PROTIME-INR
INR: 1.1 (ref 0.8–1.2)
Prothrombin Time: 13.9 seconds (ref 11.4–15.2)

## 2021-08-14 LAB — CBC
HCT: 39.8 % (ref 36.0–46.0)
Hemoglobin: 13 g/dL (ref 12.0–15.0)
MCH: 29.3 pg (ref 26.0–34.0)
MCHC: 32.7 g/dL (ref 30.0–36.0)
MCV: 89.8 fL (ref 80.0–100.0)
Platelets: 337 10*3/uL (ref 150–400)
RBC: 4.43 MIL/uL (ref 3.87–5.11)
RDW: 13.7 % (ref 11.5–15.5)
WBC: 9.8 10*3/uL (ref 4.0–10.5)
nRBC: 0 % (ref 0.0–0.2)

## 2021-08-14 LAB — APTT: aPTT: 33 seconds (ref 24–36)

## 2021-08-14 LAB — I-STAT BETA HCG BLOOD, ED (MC, WL, AP ONLY): I-stat hCG, quantitative: <5 m[IU]/mL (ref <5)

## 2021-08-14 MED ORDER — KETOROLAC TROMETHAMINE 30 MG/ML IJ SOLN
30.0000 mg | Freq: Once | INTRAMUSCULAR | Status: AC
  Administered 2021-08-14: 19:00:00 30 mg via INTRAVENOUS
  Filled 2021-08-14: qty 1

## 2021-08-14 MED ORDER — SODIUM CHLORIDE 0.9% FLUSH
3.0000 mL | Freq: Once | INTRAVENOUS | Status: AC
  Administered 2021-08-14: 19:00:00 3 mL via INTRAVENOUS

## 2021-08-14 MED ORDER — METOCLOPRAMIDE HCL 5 MG/ML IJ SOLN
10.0000 mg | Freq: Once | INTRAMUSCULAR | Status: AC
  Administered 2021-08-14: 19:00:00 10 mg via INTRAVENOUS
  Filled 2021-08-14: qty 2

## 2021-08-14 MED ORDER — DIPHENHYDRAMINE HCL 50 MG/ML IJ SOLN
12.5000 mg | Freq: Once | INTRAMUSCULAR | Status: AC
  Administered 2021-08-14: 19:00:00 12.5 mg via INTRAVENOUS
  Filled 2021-08-14: qty 1

## 2021-08-14 NOTE — Code Documentation (Signed)
Pt is a 27 yr old female with H/O pseudoseizure bipolar and recent TPA for stroke symptoms at [facility] in November. She is on no blood thinners. Pt states she had a sudden onset of left sided weakness and sensory loss at 1700. Pt arrived to [facility] from EMS at 1813. Pt's airway was cleared at bridge, CBG and labs obtained. Pt taken to stat CTNC. Per Dr Rory Percy, CT neg for acute hemorrhage. See timeline for NIHSS details and timeline. Pt returned to room 35 where her workup will continue. Bedside handoff with Alana RN. Pt will need q 2 hr VS and mNIHSS. Pt not candidate for TNK as low suspicion for stroke, recent thrombolytic with in 3 months. Pt not candidate for NIR as clinical exam LVO negative.
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
## 2021-08-14 NOTE — Discharge Instructions (Signed)
You have been seen and discharged from the emergency department.  Your MRI was normal, no signs of stroke.  Follow-up with your primary provider for further evaluation and further care. Take home medications as prescribed. If you have any worsening symptoms or further concerns for your health please return to an emergency department for further evaluation.

## 2021-08-14 NOTE — ED Triage Notes (Signed)
Pt arrived via [facility] from home for a code stroke. Pt has left sided weakness and numbness. LKW 1700 today. Pt is A&Ox4

## 2021-08-14 NOTE — ED Provider Notes (Signed)
[facility] EMERGENCY DEPARTMENT Provider Note   CSN: [id number] Arrival date & time: 08/14/21  1806  An emergency department physician performed an initial assessment on this suspected stroke patient at 1815.  History  Chief Complaint  Patient presents with   Code Stroke    Leah [name] is a 26 y.o. female.  HPI  26 year old female with past medical history of bipolar disorder presents the emergency department with concern for left-sided weakness and numbness.  Patient evaluated a code stroke, neurology team at bedside patient went straight to the CT scanner.  Patient has subjective findings, history of similar presentation in the past without any documented findings on neuroimaging for stroke.  Level 5 caveat due to acuity.  Home Medications Prior to Admission medications   Medication Sig Start Date End Date Taking? Authorizing Provider  HYDROcodone-acetaminophen (NORCO/VICODIN) 5-325 MG tablet Take 1 tablet by mouth every 4 (four) hours as needed for moderate pain. 07/30/21 07/30/22 Yes Elson Areas, PA-C  ibuprofen (ADVIL) 800 MG tablet Take 1 tablet (800 mg total) by mouth every 8 (eight) hours as needed. Patient taking differently: Take 800 mg by mouth every 8 (eight) hours as needed for moderate pain. 07/30/21  Yes Cheron Schaumann K, PA-C  metroNIDAZOLE (METROGEL VAGINAL) 0.75 % vaginal gel Insert 1 applicatorful nightly x 10 nights then twice a week for 6 months 03/06/21  Yes Walker, Jamilla R, CNM  OLANZapine (ZYPREXA) 5 MG tablet Take 1 tablet (5 mg total) by mouth at bedtime. 08/07/21  Yes Toy Cookey E, NP  benzonatate (TESSALON) 100 MG capsule Take 1 capsule (100 mg total) by mouth every 8 (eight) hours. 05/03/21   Cristopher Peru, PA-C      Allergies    Bactrim [sulfamethoxazole-trimethoprim]    Review of Systems   Review of Systems  Physical Exam Updated Vital Signs BP 109/68    Pulse 70    Temp 98.1 F (36.7 C) (Oral)    Resp 13    Ht 5'  (1.524 m)    Wt 77.6 kg    SpO2 98%    BMI 33.41 kg/m  Physical Exam  ED Results / Procedures / Treatments   Labs (all labs ordered are listed, but only abnormal results are displayed) Labs Reviewed  COMPREHENSIVE METABOLIC PANEL - Abnormal; Notable for the following components:      Result Value   CO2 21 (*)    Glucose, Bld 101 (*)    All other components within normal limits  CBG MONITORING, ED - Abnormal; Notable for the following components:   Glucose-Capillary 103 (*)    All other components within normal limits  I-STAT CHEM 8, ED - Abnormal; Notable for the following components:   Glucose, Bld 100 (*)    All other components within normal limits  PROTIME-INR  APTT  CBC  DIFFERENTIAL  CBG MONITORING, ED  I-STAT BETA HCG BLOOD, ED ([facility], [facility], [facility] ONLY)    EKG None  Radiology MR BRAIN WO CONTRAST  Result Date: 08/14/2021 CLINICAL DATA:  Neuro deficit, acute, stroke suspected EXAM: MRI HEAD WITHOUT CONTRAST TECHNIQUE: Multiplanar, multiecho pulse sequences of the brain and surrounding structures were obtained without intravenous contrast. COMPARISON:  None. FINDINGS: Susceptibility artifact from the oral cavity causes some distortion and obscuration of adjacent structures. Brain: There is no acute infarction or intracranial hemorrhage. There is no intracranial mass, mass effect, or edema. There is no hydrocephalus or extra-axial fluid collection. Ventricles and sulci are normal in size  and configuration. Vascular: Major vessel flow voids at the skull base are preserved. Skull and upper cervical spine: Normal marrow signal is preserved. Sinuses/Orbits: Minor mucosal thickening.  Orbits are unremarkable. Other: Sella is unremarkable.  Mastoid air cells are clear. IMPRESSION: No acute infarction, hemorrhage, or mass. Electronically Signed   By: Guadlupe Spanish M.D.   On: 08/14/2021 20:14   DG Chest Port 1 View  Result Date: 08/14/2021 CLINICAL DATA:  CVA EXAM: PORTABLE CHEST 1 VIEW  COMPARISON:  05/23/2021 FINDINGS: The heart size and mediastinal contours are within normal limits. Both lungs are clear. The visualized skeletal structures are unremarkable. IMPRESSION: No active disease. Electronically Signed   By: Jasmine Pang M.D.   On: 08/14/2021 21:10   CT HEAD CODE STROKE WO CONTRAST  Result Date: 08/14/2021 CLINICAL DATA:  Code stroke. Neuro deficit, acute, stroke suspected. Left sided weakness EXAM: CT HEAD WITHOUT CONTRAST TECHNIQUE: Contiguous axial images were obtained from the base of the skull through the vertex without intravenous contrast. RADIATION DOSE REDUCTION: This exam was performed according to the departmental dose-optimization program which includes automated exposure control, adjustment of the mA and/or kV according to patient size and/or use of iterative reconstruction technique. COMPARISON:  None. FINDINGS: Brain: There is no acute intracranial hemorrhage, mass effect, or edema. Gray-white differentiation is preserved. Ventricles and sulci are normal in size and configuration. No extra-axial collection. Vascular: No hyperdense vessel. Skull: Unremarkable. Sinuses/Orbits: Aerated.  Orbits are unremarkable. Other: Mastoid air cells are clear. ASPECTS (Alberta Stroke Program Early CT Score) - Ganglionic level infarction (caudate, lentiform nuclei, internal capsule, insula, M1-M3 cortex): 7 - Supraganglionic infarction (M4-M6 cortex): 3 Total score (0-10 with 10 being normal): 10 IMPRESSION: There is no acute intracranial hemorrhage or evidence of acute infarction. ASPECT score is 10. These results were communicated to Dr. Wilford Corner at 6:27 pm on 08/14/2021 by text page via the [facility] messaging system. Electronically Signed   By: Guadlupe Spanish M.D.   On: 08/14/2021 18:28    Procedures .Critical Care Performed by: Rozelle Logan, DO Authorized by: Rozelle Logan, DO   Critical care provider statement:    Critical care time (minutes):  45   Critical care time was  exclusive of:  Separately billable procedures and treating other patients   Critical care was necessary to treat or prevent imminent or life-threatening deterioration of the following conditions:  CNS failure or compromise   Critical care was time spent personally by me on the following activities:  Development of treatment plan with patient or surrogate, discussions with consultants, evaluation of patient's response to treatment, examination of patient, ordering and review of laboratory studies, ordering and review of radiographic studies, ordering and performing treatments and interventions, pulse oximetry, re-evaluation of patient's condition and review of old charts   I assumed direction of critical care for this patient from another provider in my specialty: no      Medications Ordered in ED Medications  sodium chloride flush (NS) 0.9 % injection 3 mL (3 mLs Intravenous Given 08/14/21 1901)  ketorolac (TORADOL) 30 MG/ML injection 30 mg (30 mg Intravenous Given 08/14/21 1901)  metoCLOPramide (REGLAN) injection 10 mg (10 mg Intravenous Given 08/14/21 1901)  diphenhydrAMINE (BENADRYL) injection 12.5 mg (12.5 mg Intravenous Given 08/14/21 1900)    ED Course/ Medical Decision Making/ A&P                           Medical Decision Making Amount and/or Complexity  of Data Reviewed Labs: ordered. Radiology: ordered.  Risk Prescription drug management.   This patient presents to the ED for concern of left-sided weakness and numbness, this involves an extensive number of treatment options, and is a complaint that carries with it a high risk of complications and morbidity.  The differential diagnosis includes TIA, CVA, conversion, psychosomatic   Additional history obtained: -Additional history obtained from EMS -External records from outside source obtained and reviewed including: Chart review including previous notes, labs, imaging, consultation notes   Lab Tests: -I ordered, reviewed, and  interpreted labs.  The pertinent results include: Baseline labs   EKG -Sinus rhythm   Imaging Studies ordered: -I ordered imaging studies including CT and MRI imaging -I independently visualized and interpreted imaging which showed no findings of acute stroke -I agree with the radiologist interpretation   Medicines ordered and prescription drug management: -I ordered medication including migraine cocktail for possible complicated migraine presentation -Reevaluation of the patient after these medicines showed that the patient resolved -I have reviewed the patients home medicines and have made adjustments as needed   Consultations Obtained: I requested consultation with the stroke neurology,  and discussed lab and imaging findings as well as pertinent plan - they recommend: MRI and if negative she can be discharged home   ED Course: 26 year old female presents emergency department as a code stroke for left-sided weakness and numbness.  Neuro imaging is negative.  Possible complicated migraine presentation but otherwise symptoms do not seem genuine from a neuro etiology.  After migraine cocktail her symptoms are completely resolved.  Neurology has cleared the patient and there is no other need for emergent medical admission.  She is ambulatory, able to eat and drink.   Critical Interventions: Code stroke   Cardiac Monitoring: The patient was maintained on a cardiac monitor.  I personally viewed and interpreted the cardiac monitored which showed an underlying rhythm of: Sinus   Reevaluation: After the interventions noted above, I reevaluated the patient and found that they have :resolved   Dispostion: Patient at this time appears safe and stable for discharge and close outpatient follow up. Discharge plan and strict return to ED precautions discussed, patient verbalizes understanding and agreement.        Final Clinical Impression(s) / ED Diagnoses Final diagnoses:  None     Rx / DC Orders ED Discharge Orders     None         Rozelle LoganHorton, Jocie Meroney M, DO 08/14/21 2310

## 2021-08-14 NOTE — ED Notes (Signed)
Paged Dr Wilford Corner for RN Percival Spanish at 6:46P

## 2021-08-15 ENCOUNTER — Telehealth: Payer: Self-pay

## 2021-08-15 NOTE — Telephone Encounter (Addendum)
Transition Care Management Unsuccessful Follow-up Telephone Call  Date of discharge and from where:  06/24/2022 [facility]   Attempts:  1st Attempt  Reason for unsuccessful TCM follow-up call:  Left voice message    

## 2021-08-16 NOTE — Telephone Encounter (Signed)
Transition Care Management Unsuccessful Follow-up Telephone Call  Date of discharge and from where:  08/14/2021[facility]   Attempts:  2nd Attempt  Reason for unsuccessful TCM follow-up call:  Left voice message

## 2021-08-17 NOTE — Telephone Encounter (Signed)
Transition Care Management Unsuccessful Follow-up Telephone Call  Date of discharge and from where:  08/14/2021[facility]   Attempts:  3rd Attempt  Reason for unsuccessful TCM follow-up call:  Left voice message

## 2021-08-21 ENCOUNTER — Other Ambulatory Visit (HOSPITAL_COMMUNITY)
Admission: RE | Admit: 2021-08-21 | Discharge: 2021-08-21 | Disposition: A | Discharge status: Home / Self Care | Payer: Medicaid Other | Source: Ambulatory Visit | Attending: Family Medicine | Admitting: Family Medicine

## 2021-08-21 ENCOUNTER — Other Ambulatory Visit: Payer: Self-pay

## 2021-08-21 ENCOUNTER — Ambulatory Visit (INDEPENDENT_AMBULATORY_CARE_PROVIDER_SITE_OTHER): Payer: Medicaid Other

## 2021-08-21 VITALS — BP 113/62 | HR 75 | Wt 162.9 lb

## 2021-08-21 DIAGNOSIS — Z113 Encounter for screening for infections with a predominantly sexual mode of transmission: Secondary | ICD-10-CM

## 2021-08-21 NOTE — Progress Notes (Signed)
Patient here today requesting STD testing. Patient denies any symptoms. Patient states she wants to do a self swab and does not want any blood work done. I instructed patient on how to collect a self swab. Patient collected swab without any issues. I explained to patient we will notify her with any abnormal results. Patient denies any other concerns or questions.  Alesia Richards, RN 08/21/21

## 2021-08-22 DIAGNOSIS — Z419 Encounter for procedure for purposes other than remedying health state, unspecified: Secondary | ICD-10-CM | POA: Diagnosis not present

## 2021-08-22 LAB — CERVICOVAGINAL ANCILLARY ONLY
Bacterial Vaginitis (gardnerella): NEGATIVE
Candida Glabrata: NEGATIVE
Candida Vaginitis: NEGATIVE
Chlamydia: NEGATIVE
Comment: NEGATIVE
Comment: NEGATIVE
Comment: NEGATIVE
Comment: NEGATIVE
Comment: NEGATIVE
Comment: NORMAL
Neisseria Gonorrhea: NEGATIVE
Trichomonas: NEGATIVE

## 2021-09-10 DIAGNOSIS — F319 Bipolar disorder, unspecified: Secondary | ICD-10-CM | POA: Diagnosis not present

## 2021-09-14 IMAGING — CR DG ABDOMEN ACUTE W/ 1V CHEST
3 series · 3 of 3 positions shown · non-contrast
Comparison: Acute abdominal series 10/16/2017.

CLINICAL DATA: 24-year-old female with history of abdominal pain.
Heartburn for 1 week. Nausea and vomiting.

EXAM:
DG ABDOMEN ACUTE W/ 1V CHEST

[w chest pa]
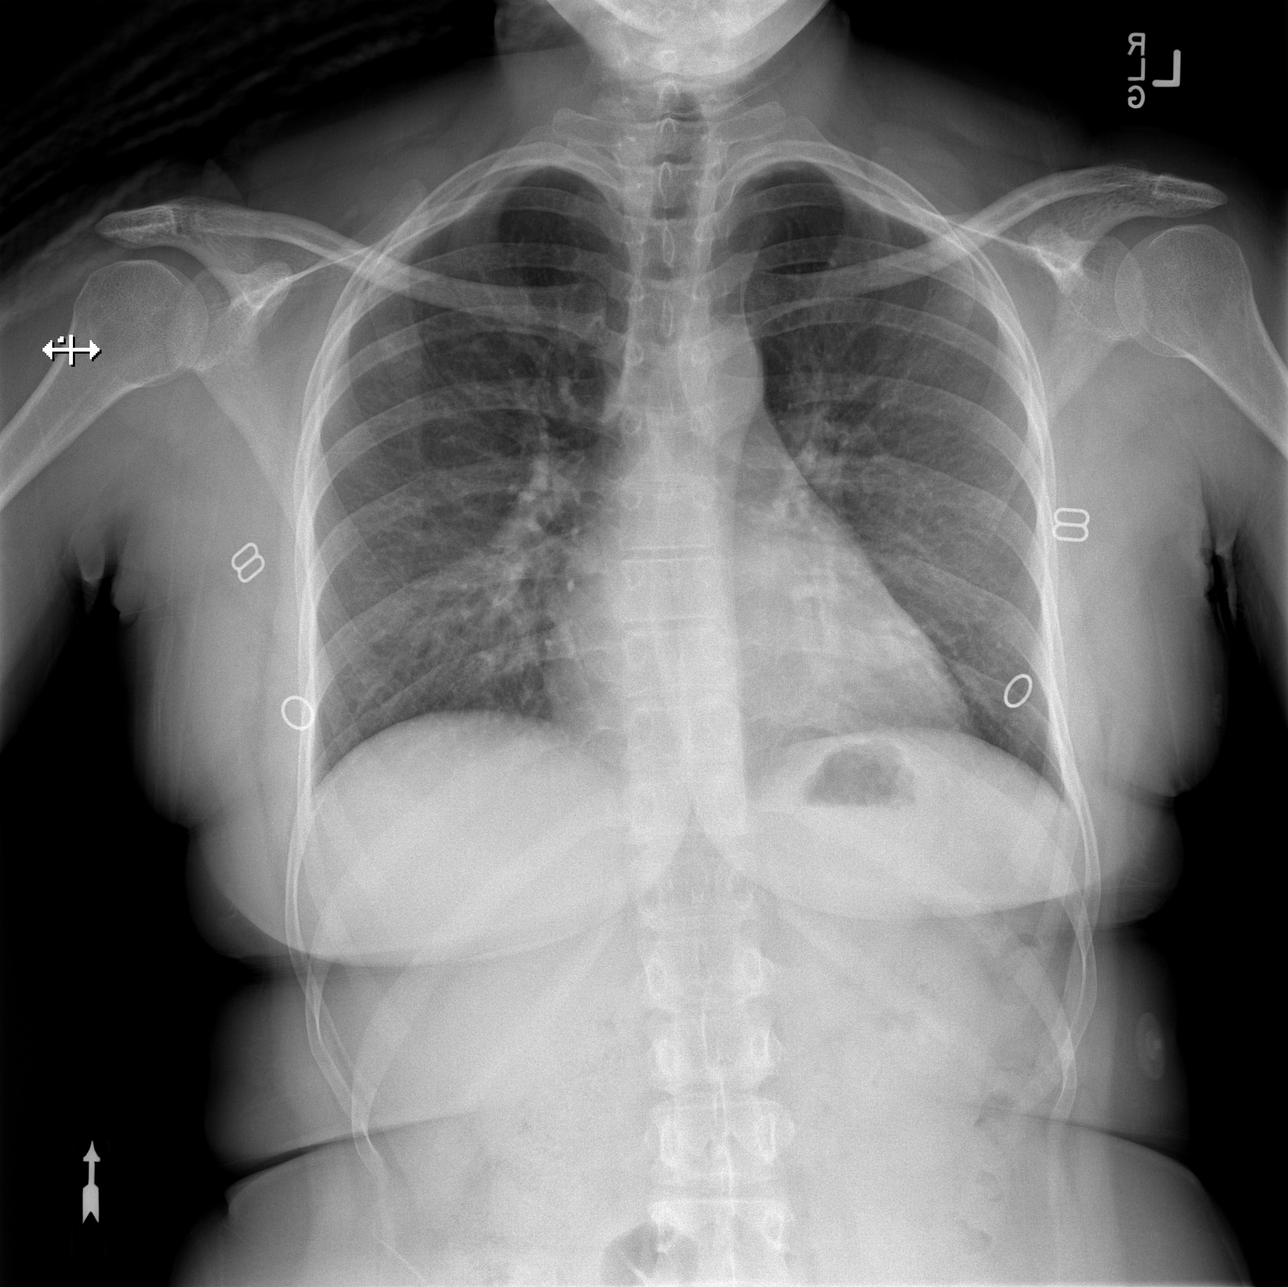

[w abdomen upright]
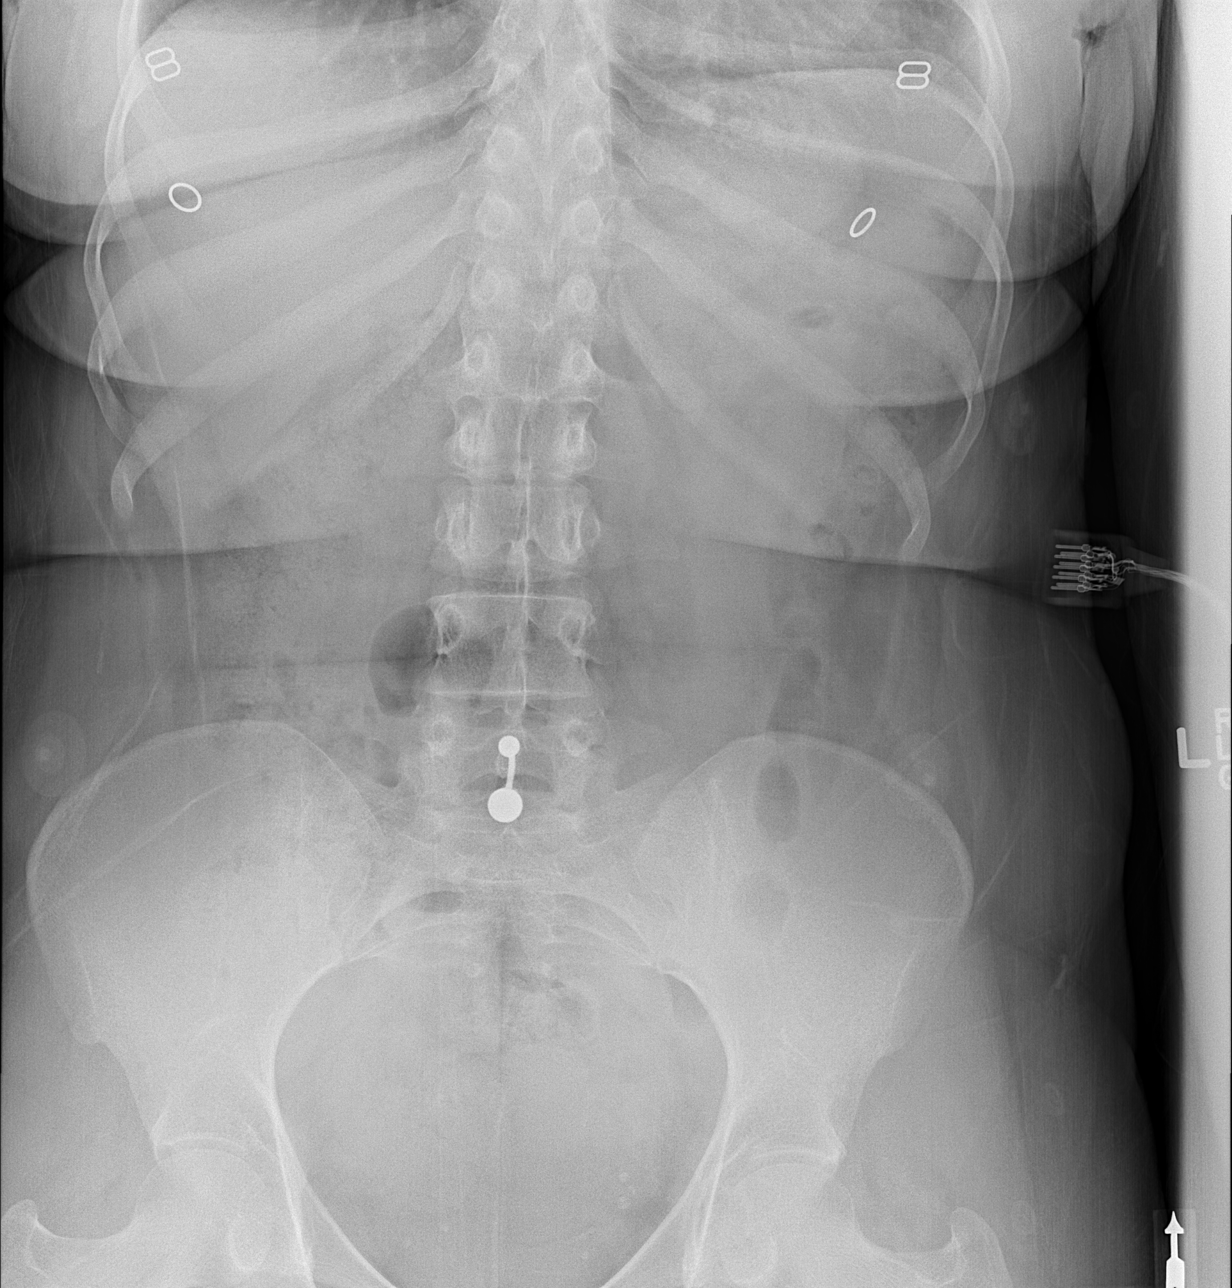

[t abdomen supine]
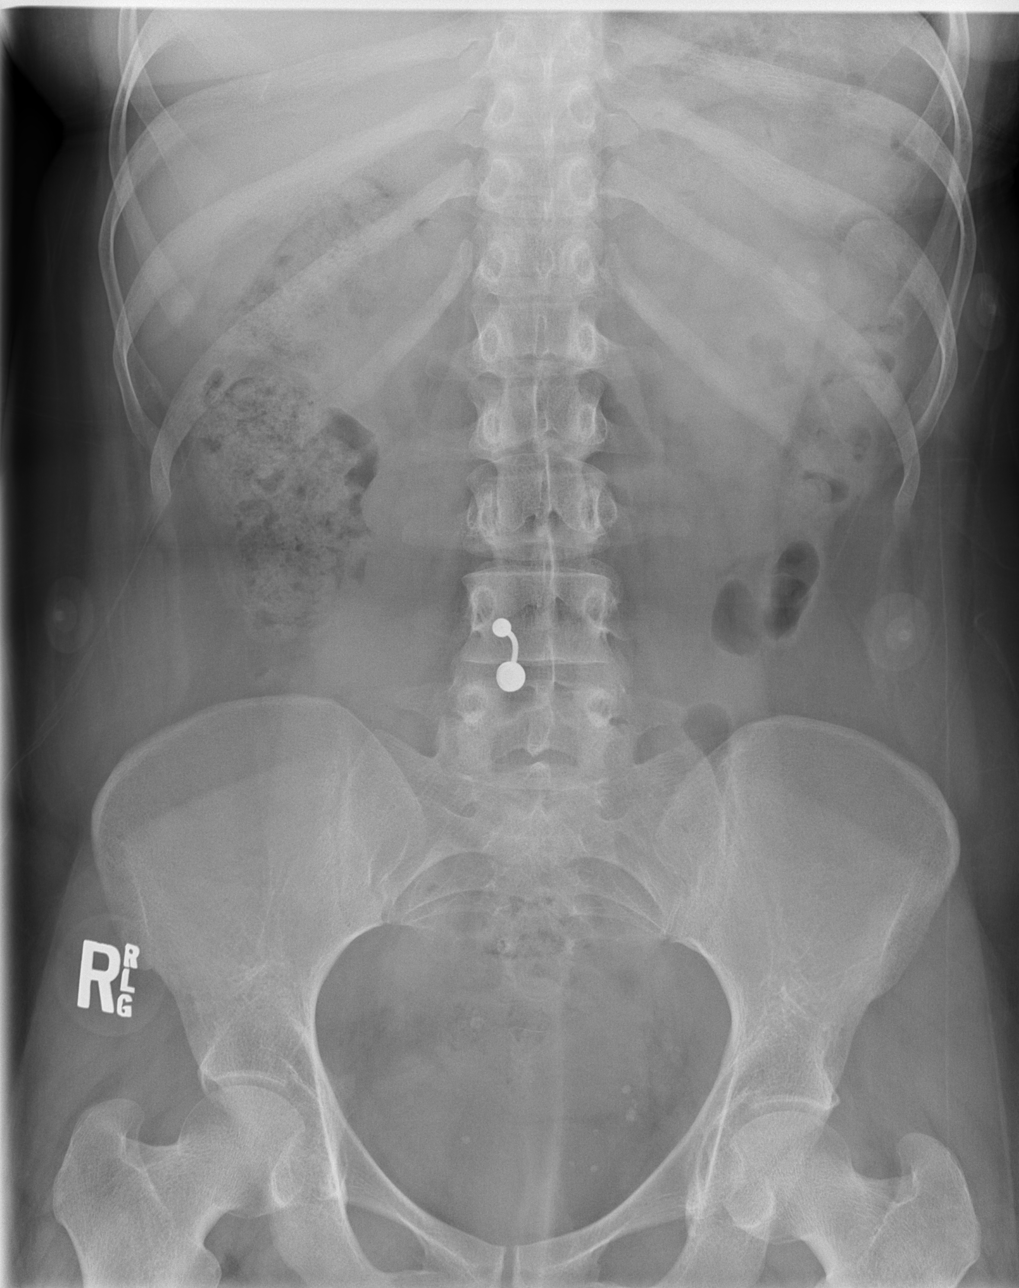

[3 of 3 positions shown; findings below may reference images not displayed]

FINDINGS: Lung volumes are normal. No consolidative airspace disease. No
pleural effusions. No pneumothorax. No pulmonary nodule or mass
noted. Pulmonary vasculature and the cardiomediastinal silhouette
are within normal limits.

Gas and stool are seen scattered throughout the colon extending to
the level of the distal rectum. No pathologic distension of small
bowel is noted. No gross evidence of pneumoperitoneum.
IMPRESSION: 1. Nonobstructive bowel gas pattern.
2. No pneumoperitoneum.
3.  No radiographic evidence of acute cardiopulmonary disease.

## 2021-09-19 DIAGNOSIS — Z419 Encounter for procedure for purposes other than remedying health state, unspecified: Secondary | ICD-10-CM | POA: Diagnosis not present

## 2021-09-21 ENCOUNTER — Encounter: Payer: Self-pay | Admitting: Family Medicine

## 2021-09-21 ENCOUNTER — Other Ambulatory Visit: Payer: Self-pay

## 2021-09-21 ENCOUNTER — Ambulatory Visit (INDEPENDENT_AMBULATORY_CARE_PROVIDER_SITE_OTHER): Payer: Medicaid Other | Admitting: Family Medicine

## 2021-09-21 ENCOUNTER — Other Ambulatory Visit (HOSPITAL_COMMUNITY)
Admission: RE | Admit: 2021-09-21 | Discharge: 2021-09-21 | Disposition: A | Discharge status: Home / Self Care | Payer: Medicaid Other | Source: Ambulatory Visit | Attending: Family Medicine | Admitting: Family Medicine

## 2021-09-21 VITALS — BP 114/71 | HR 71 | Wt 162.3 lb

## 2021-09-21 DIAGNOSIS — Z3169 Encounter for other general counseling and advice on procreation: Secondary | ICD-10-CM | POA: Diagnosis not present

## 2021-09-21 DIAGNOSIS — G43409 Hemiplegic migraine, not intractable, without status migrainosus: Secondary | ICD-10-CM | POA: Diagnosis not present

## 2021-09-21 DIAGNOSIS — Z01419 Encounter for gynecological examination (general) (routine) without abnormal findings: Secondary | ICD-10-CM

## 2021-09-21 DIAGNOSIS — Z124 Encounter for screening for malignant neoplasm of cervix: Secondary | ICD-10-CM

## 2021-09-21 DIAGNOSIS — Z3046 Encounter for surveillance of implantable subdermal contraceptive: Secondary | ICD-10-CM

## 2021-09-21 MED ORDER — PRENATAL VITAMIN 27-0.8 MG PO TABS: 1.0000 | ORAL_TABLET | Freq: Every day | ORAL | 3 refills | Status: DC

## 2021-09-21 NOTE — Progress Notes (Signed)
Subjective:  ?  ? Leah [name] is a 26 y.o. female and is here for a comprehensive physical exam. The patient reports no problems. Wants her IUD out. ? ? ?The following portions of the patient's history were reviewed and updated as appropriate: allergies, current medications, past family history, past medical history, past social history, past surgical history, and problem list. ? ?Review of Systems ?Pertinent items noted in HPI and remainder of comprehensive ROS otherwise negative.  ? ?Objective:  ? ? BP 114/71   Pulse 71   Wt 162 lb 4.8 oz (73.6 kg)   Breastfeeding No   BMI 31.70 kg/m?  ?General appearance: alert, cooperative, and appears stated age ?Head: Normocephalic, without obvious abnormality, atraumatic ?Neck: no adenopathy, supple, symmetrical, trachea midline, and thyroid not enlarged, symmetric, no tenderness/mass/nodules ?Lungs: clear to auscultation bilaterally ?Breasts: normal appearance, no masses or tenderness ?Heart: regular rate and rhythm, S1, S2 normal, no murmur, click, rub or gallop ?Abdomen: soft, non-tender; bowel sounds normal; no masses,  no organomegaly ?Pelvic: cervix normal in appearance, external genitalia normal, no adnexal masses or tenderness, no cervical motion tenderness, uterus normal size, shape, and consistency, and vagina normal without discharge ?Extremities: extremities normal, atraumatic, no cyanosis or edema ?Pulses: 2+ and symmetric ?Skin: Skin color, texture, turgor normal. No rashes or lesions ?Lymph nodes: Cervical, supraclavicular, and axillary nodes normal. ?Neurologic: Grossly normal  ?  ?Procedure: ?Nexplanon removal ?Patient given informed consent for removal of her Nexplanon, time out was performed.  Signed copy in the chart.  Appropriate time out taken. Nexplanon site identified.  Area prepped in usual sterile fashon. Three cc of 1% lidocaine was used to anesthetize the area at the distal end of the implant. A small stab incision was made right beside the  implant on the distal portion.  The Nexplanon rod was grasped using hemostats and removed without difficulty.  There was less than 10 cc blood loss. There were no complications.  A small amount of antibiotic ointment and steri-strips were applied over the small incision.  A pressure bandage was applied to reduce any bruising.  The patient tolerated the procedure well and was given post procedure instructions. ? ?Assessment:  ? ? Healthy female exam.    ?  ?Plan:  ?Encounter for gynecological examination without abnormal finding - 99395 ? ?Screening for malignant neoplasm of cervix - [phone] - Plan: Cytology - PAP( [place]) ? ?Nexplanon removal - desires pregnancy ? ?Hemiplegic migraine without status migrainosus, not intractable - 99213 - did recieve TPA at [facility], but records suggest no stroke noted on MRI ? ?Encounter for preconception consultation - advised avoidance of Cocaine and weed--begin PNVs ? ?Return in 1 year (on 09/22/2022). ? ?  ?See After Visit Summary for Counseling Recommendations  ? ?

## 2021-09-21 NOTE — Patient Instructions (Signed)
Preventive Care 61-27 Years Old, Female ?Preventive care refers to lifestyle choices and visits with your health care provider that can promote health and wellness. Preventive care visits are also called wellness exams. ?What can I expect for my preventive care visit? ?Counseling ?During your preventive care visit, your health care provider may ask about your: ?Medical history, including: ?Past medical problems. ?Family medical history. ?Pregnancy history. ?Current health, including: ?Menstrual cycle. ?Method of birth control. ?Emotional well-being. ?Home life and relationship well-being. ?Sexual activity and sexual health. ?Lifestyle, including: ?Alcohol, nicotine or tobacco, and drug use. ?Access to firearms. ?Diet, exercise, and sleep habits. ?Work and work Statistician. ?Sunscreen use. ?Safety issues such as seatbelt and bike helmet use. ?Physical exam ?Your health care provider may check your: ?Height and weight. These may be used to calculate your BMI (body mass index). BMI is a measurement that tells if you are at a healthy weight. ?Waist circumference. This measures the distance around your waistline. This measurement also tells if you are at a healthy weight and may help predict your risk of certain diseases, such as type 2 diabetes and high blood pressure. ?Heart rate and blood pressure. ?Body temperature. ?Skin for abnormal spots. ?What immunizations do I need? ?Vaccines are usually given at various ages, according to a schedule. Your health care provider will recommend vaccines for you based on your age, medical history, and lifestyle or other factors, such as travel or where you work. ?What tests do I need? ?Screening ?Your health care provider may recommend screening tests for certain conditions. This may include: ?Pelvic exam and Pap test. ?Lipid and cholesterol levels. ?Diabetes screening. This is done by checking your blood sugar (glucose) after you have not eaten for a while (fasting). ?Hepatitis B  test. ?Hepatitis C test. ?HIV (human immunodeficiency virus) test. ?STI (sexually transmitted infection) testing, if you are at risk. ?BRCA-related cancer screening. This may be done if you have a family history of breast, ovarian, tubal, or peritoneal cancers. ?Talk with your health care provider about your test results, treatment options, and if necessary, the need for more tests. ?Follow these instructions at home: ?Eating and drinking ? ?Eat a healthy diet that includes fresh fruits and vegetables, whole grains, lean protein, and low-fat dairy products. ?Take vitamin and mineral supplements as recommended by your health care provider. ?Do not drink alcohol if: ?Your health care provider tells you not to drink. ?You are pregnant, may be pregnant, or are planning to become pregnant. ?If you drink alcohol: ?Limit how much you have to 0-1 drink a day. ?Know how much alcohol is in your drink. In the [place] one drink equals one 12 oz bottle of beer (355 mL), one 5 oz glass of wine (148 mL), or one 1? oz glass of hard liquor (44 mL). ?Lifestyle ?Brush your teeth every morning and night with fluoride toothpaste. Floss one time each day. ?Exercise for at least 30 minutes 5 or more days each week. ?Do not use any products that contain nicotine or tobacco. These products include cigarettes, chewing tobacco, and vaping devices, such as e-cigarettes. If you need help quitting, ask your health care provider. ?Do not use drugs. ?If you are sexually active, practice safe sex. Use a condom or other form of protection to prevent STIs. ?If you do not wish to become pregnant, use a form of birth control. If you plan to become pregnant, see your health care provider for a prepregnancy visit. ?Find healthy ways to manage stress, such as: ?Meditation, yoga,  or listening to music. ?Journaling. ?Talking to a trusted person. ?Spending time with friends and family. ?Minimize exposure to UV radiation to reduce your risk of skin  cancer. ?Safety ?Always wear your seat belt while driving or riding in a vehicle. ?Do not drive: ?If you have been drinking alcohol. Do not ride with someone who has been drinking. ?If you have been using any mind-altering substances or drugs. ?While texting. ?When you are tired or distracted. ?Wear a helmet and other protective equipment during sports activities. ?If you have firearms in your house, make sure you follow all gun safety procedures. ?Seek help if you have been physically or sexually abused. ?What's next? ?Go to your health care provider once a year for an annual wellness visit. ?Ask your health care provider how often you should have your eyes and teeth checked. ?Stay up to date on all vaccines. ?This information is not intended to replace advice given to you by your health care provider. Make sure you discuss any questions you have with your health care provider. ?Document Revised: 01/03/2021 Document Reviewed: 01/03/2021 ?Elsevier Patient Education ? [place]. ? ?

## 2021-09-24 DIAGNOSIS — F319 Bipolar disorder, unspecified: Secondary | ICD-10-CM | POA: Diagnosis not present

## 2021-09-25 LAB — CYTOLOGY - PAP
Adequacy: ABSENT
Chlamydia: NEGATIVE
Comment: NEGATIVE
Comment: NEGATIVE
Comment: NORMAL
Diagnosis: NEGATIVE
Neisseria Gonorrhea: NEGATIVE
Trichomonas: NEGATIVE

## 2021-10-18 ENCOUNTER — Telehealth (INDEPENDENT_AMBULATORY_CARE_PROVIDER_SITE_OTHER): Payer: Medicaid Other | Admitting: Psychiatry

## 2021-10-18 DIAGNOSIS — F319 Bipolar disorder, unspecified: Secondary | ICD-10-CM | POA: Diagnosis not present

## 2021-10-18 MED ORDER — OLANZAPINE 5 MG PO TABS: 5.0000 mg | ORAL_TABLET | Freq: Every day | ORAL | 2 refills | Status: DC

## 2021-10-18 NOTE — Progress Notes (Signed)
BH MD/PA/NP OP Progress Note ? ?10/18/2021 2:08 PM ?Leah M Towers  ?MRN:  [id number] ? ?Virtual Visit via Video Note ? ?I connected with Leah M Mcduffee on 10/18/21 at  2:00 PM EDT by a video enabled telemedicine application and verified that I am speaking with the correct person using two identifiers. ? ?Location: ?Patient: Home ?Provider: Offsite ?  ?I discussed the limitations of evaluation and management by telemedicine and the availability of in person appointments. The patient expressed understanding and agreed to proceed. ? ? ?  ?I discussed the assessment and treatment plan with the patient. The patient was provided an opportunity to ask questions and all were answered. The patient agreed with the plan and demonstrated an understanding of the instructions. ?  ?The patient was advised to call back or seek an in-person evaluation if the symptoms worsen or if the condition fails to improve as anticipated. ? ?I provided 10 minutes of non-face-to-face time during this encounter. ? ? ?Mcneil Sober, NP  ?Chief Complaint: Medication management ? ?HPI: Leah [name] is a 26 year old female presenting to [facility] behavioral health outpatient for follow-up psychiatric evaluation.  She has a psychiatric history of bipolar disorder and major depression.  Patient symptoms are managed with olanzapine 5 mg at bedtime.  Patient reports that medications are effective with managing symptoms and that she is medication compliant.  Patient denies adverse effects or the need for dosage adjustment today.  No medication changes today.  Zyprexa 5 mg refilled at current dosage. ?Patient is alert and oriented x4, calm, pleasant and willing to engage.  She reports good mood, sleep and appetite.  She appears well-groomed and dressed appropriately for the weather.  Patient denies suicidal or homicidal ideations, paranoia, delusional thought, visual or auditory hallucinations. ?Visit Diagnosis:  ?  ICD-10-CM   ?1. Bipolar 1 disorder (HCC)   F31.9 OLANZapine (ZYPREXA) 5 MG tablet  ?  ? ? ?Past Psychiatric History: Bipolar disorder and major depression ? ?Past Medical History:  ?Past Medical History:  ?Diagnosis Date  ? Anemia 2013  ? Bipolar 1 disorder (HCC)   ? Gall stones   ? GERD (gastroesophageal reflux disease)   ? 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  ?Past Surgical History:  ?Procedure Laterality Date  ? CHOLECYSTECTOMY N/A 09/28/2014  ? Procedure: LAPAROSCOPIC CHOLECYSTECTOMY;  Surgeon: Axel Filler, MD;  [facility]: [facility] OR;  Service: General;  Laterality: N/A;  ? DIRECT LARYNGOSCOPY N/A 04/24/2014  ? Procedure: DIRECT LARYNGOSCOPY;  Surgeon: [name], MD;  Location: [facility] OR;  Service: ENT;  Laterality: N/A;  ? FOREIGN BODY REMOVAL ESOPHAGEAL N/A 04/24/2014  ? Procedure: REMOVAL FOREIGN BODY ESOPHAGEAL;  Surgeon: [name], MD;  Location: [facility] OR;  Service: ENT;  Laterality: N/A;  ? WISDOM TOOTH EXTRACTION  2013  ? ? ?Family Psychiatric History: None known ? ?Family History:  ?Family History  ?Problem Relation Age of Onset  ? Arthritis Mother   ? Bronchitis Mother   ? Asthma Mother   ? Diabetes Neg Hx   ? Stomach cancer Neg Hx   ? Colon cancer Neg Hx   ? ? ?Social History:  ?Social History  ? ?Socioeconomic History  ? Marital status: Single  ?  Spouse name: Not on file  ? Number of children: 2  ? Years of education: 25 th   ? Highest education level: Not on file  ?Occupational History  ? 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 Occupation: PCA  ?  Comment: personal care assistant  ?Tobacco Use  ? Smoking status: Never  ? Smokeless tobacco: Never  ? Tobacco comments:  ?  marjiuna  ?Vaping Use  ? Vaping Use: Never used  ?Substance and Sexual Activity  ? Alcohol use: Not Currently  ?  Alcohol/week: 6.0 standard drinks  ?  Types: 6 Cans of beer per week  ? Drug use: Not Currently  ?  Types: Marijuana, Cocaine  ?  Comment: last used before pregnancy  ? Sexual activity: Yes  ?  Partners: Male  ?  Birth control/protection: Implant  ?Other Topics  Concern  ? Not on file  ?Social History Narrative  ? Not on file  ? ?Social Determinants of Health  ? ?Financial Resource Strain: Not on file  ?Food Insecurity: No Food Insecurity  ? Worried About Programme researcher, broadcasting/film/video in the Last Year: Never true  ? Ran Out of Food in the Last Year: Never true  ?Transportation Needs: No Transportation Needs  ? Lack of Transportation (Medical): No  ? Lack of Transportation (Non-Medical): No  ?Physical Activity: Not on file  ?Stress: Not on file  ?Social Connections: Not on file  ? ? ?Allergies:  ?Allergies  ?Allergen Reactions  ? Bactrim [Sulfamethoxazole-Trimethoprim] Rash  ? ? ?Metabolic Disorder Labs: ?Lab Results  ?Component Value Date  ? HGBA1C 5.6 04/11/2020  ? MPG 105.41 12/31/2018  ? MPG 103 08/22/2016  ? ?Lab Results  ?Component Value Date  ? PROLACTIN 18.9 10/01/2017  ? PROLACTIN 6.4 11/18/2014  ? ?Lab Results  ?Component Value Date  ? CHOL 159 12/31/2018  ? TRIG 31 12/31/2018  ? HDL 57 12/31/2018  ? CHOLHDL 2.8 12/31/2018  ? VLDL 6 12/31/2018  ? LDLCALC 96 12/31/2018  ? LDLCALC 82 08/22/2016  ? ?Lab Results  ?Component Value Date  ? TSH 0.940 12/31/2018  ? TSH 0.809 10/01/2017  ? ? ?Therapeutic Level Labs: ?No results found for: LITHIUM ?No results found for: VALPROATE ?No components found for:  CBMZ ? ?Current Medications: ?Current Outpatient Medications  ?Medication Sig Dispense Refill  ? OLANZapine (ZYPREXA) 5 MG tablet Take 1 tablet (5 mg total) by mouth at bedtime. 30 tablet 2  ? Prenatal Vit-Fe Fumarate-FA (PRENATAL VITAMIN) 27-0.8 MG TABS Take 1 tablet by mouth daily. 90 tablet 3  ? ?No current facility-administered medications for this visit.  ? ? ? ?Musculoskeletal: ?Strength & Muscle Tone: N/A virtual visit ?Gait & Station: N/A virtual visit ?Patient leans: N/A ? ?Psychiatric Specialty Exam: ?Review of Systems  ?Psychiatric/Behavioral:  Negative for hallucinations, self-injury and suicidal ideas.   ?All other systems reviewed and are negative.  ?not currently  breastfeeding.There is no height or weight on file to calculate BMI.  ?General Appearance: Well-groomed  ?Eye Contact: Good  ?Speech: Normal clear and coherent  ?Volume: Normal  ?Mood: Euthymic  ?Affect: Congruent  ?Thought Process: Goal directed  ?Orientation:  Full (Time, Place, and Person)  ?Thought Content: Logical   ?Suicidal Thoughts:  No  ?Homicidal Thoughts:  No  ?Memory: Good  ?Judgement: Good  ?Insight: Good  ?Psychomotor Activity:  NA  ?Concentration: Good  ?Recall: Good  ?Fund of Knowledge: Good  ?Language: Good  ?Akathisia: N/A  ?Handed: Right  ?AIMS (if indicated): Not done  ?Assets:  Communication Skills  ?ADL's:  Intact  ?Cognition: WNL  ?Sleep:  Good  ? ?Screenings: ?AIMS   ? ?Flowsheet Row [facility] (Discharged[facility]  ?AIMS Total Score 0 0  ? ?  ? ?AUDIT   ? ?Flowsheet Row Admission (Discharged) from [facility] Ad[facility] (Discharged[facility]  ?Alcohol Use Disorder Identification Test Final Score (AUDIT) 1 1 8   ? ?  ? ?GAD-7   ? ?Flowsheet Row Procedure visit [facility] from [facility]2021 [facility] from 11/30/2020 [facility]  ?Total GAD-7 Score 4 4 5 7 2   ? ?  ? ?PHQ2-9   ? ?Flowsheet Row Procedure visit from 09/21/2021 in [facility] Video Visit from  [facility]2021 [facility] from 05/01/2021 in [facility]

## 2021-10-20 DIAGNOSIS — Z419 Encounter for procedure for purposes other than remedying health state, unspecified: Secondary | ICD-10-CM | POA: Diagnosis not present

## 2021-11-05 DIAGNOSIS — F319 Bipolar disorder, unspecified: Secondary | ICD-10-CM | POA: Diagnosis not present

## 2021-11-14 IMAGING — US US OB < 14 WEEKS - US OB TV
1 series · 15 of 28 positions shown · non-contrast
Comparison: None.

CLINICAL DATA: Initial evaluation for acute right lower quadrant
pain, early pregnancy.

EXAM:
OBSTETRIC <14 WK US AND TRANSVAGINAL OB US
TECHNIQUE: Both transabdominal and transvaginal ultrasound examinations were
performed for complete evaluation of the gestation as well as the
maternal uterus, adnexal regions, and pelvic cul-de-sac.
Transvaginal technique was performed to assess early pregnancy.

[Series 1: us ob < 14 weeks - us ob tv · 15 of 76 slices shown]
[im 1/76]
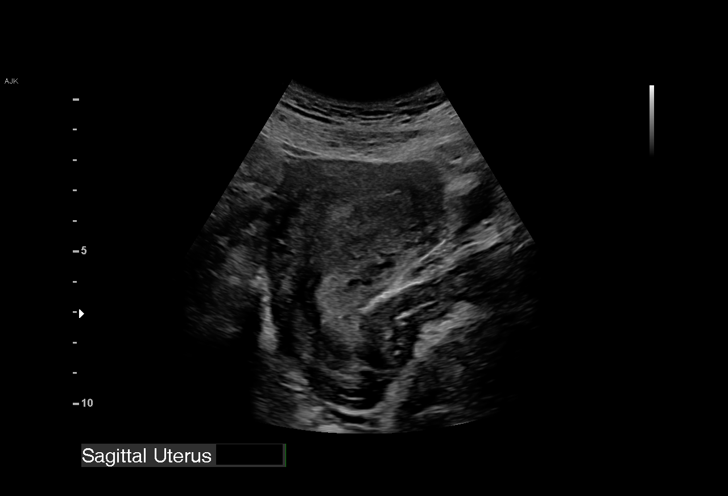
[im 6/76]
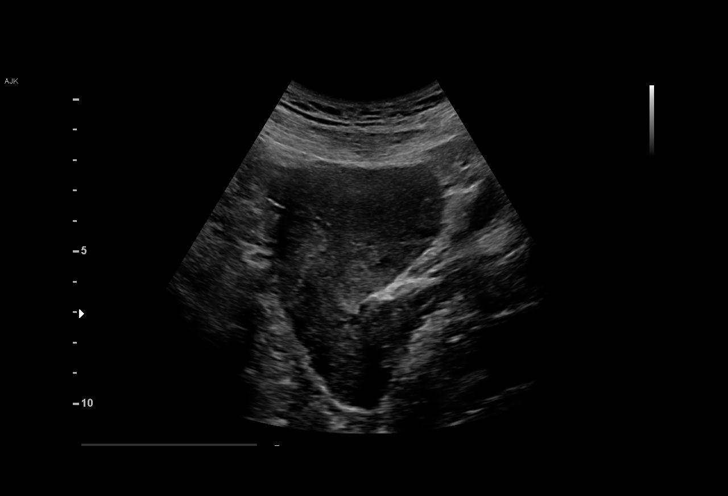
[im 12/76]
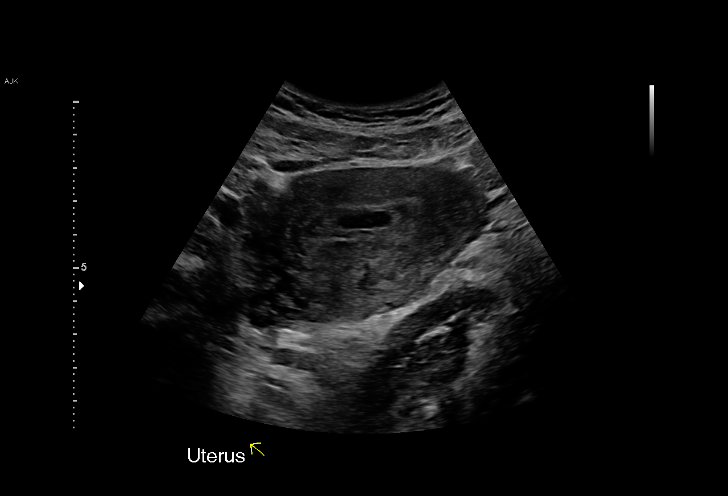
[im 17/76]
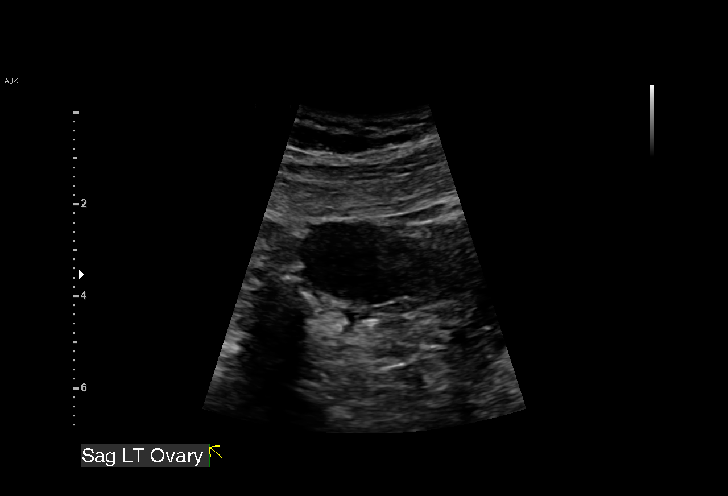
[im 23/76]
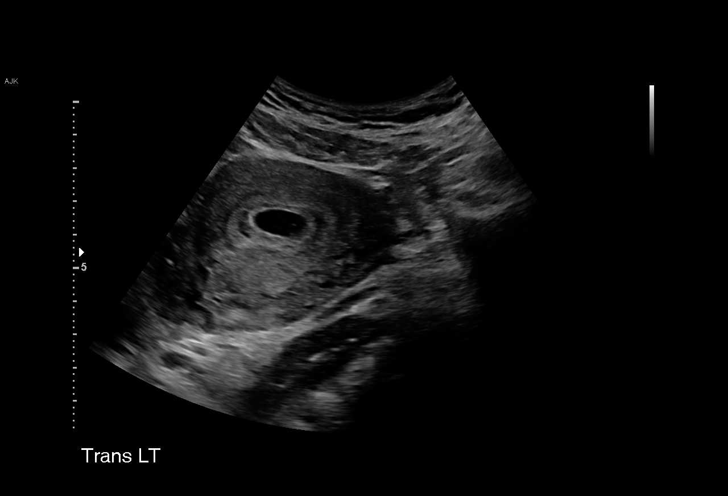
[im 28/76]
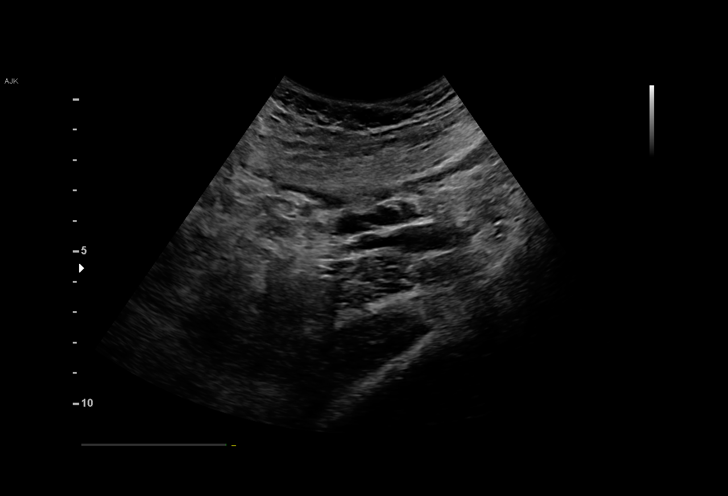
[im 34/76]
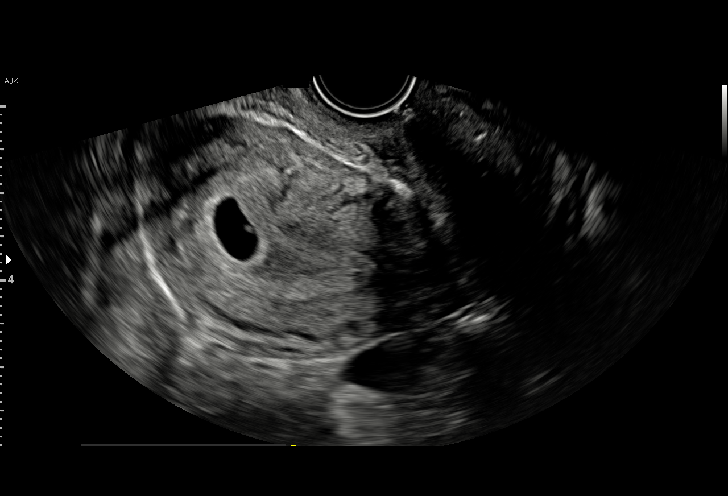
[im 39/76]
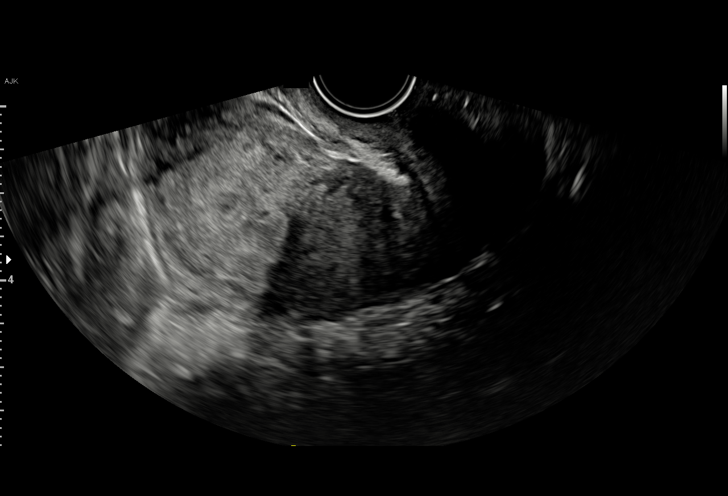
[im 42/76]
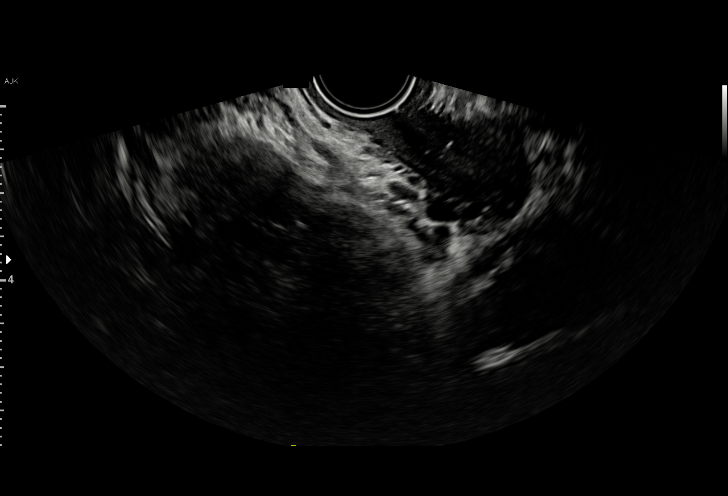
[im 48/76]
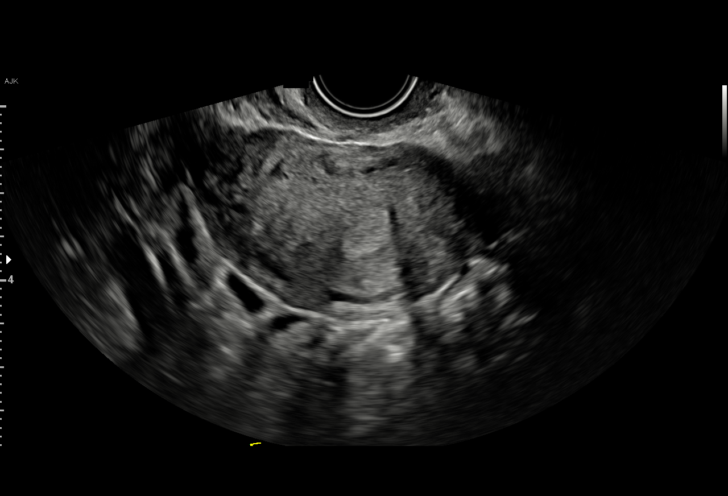
[im 53/76]
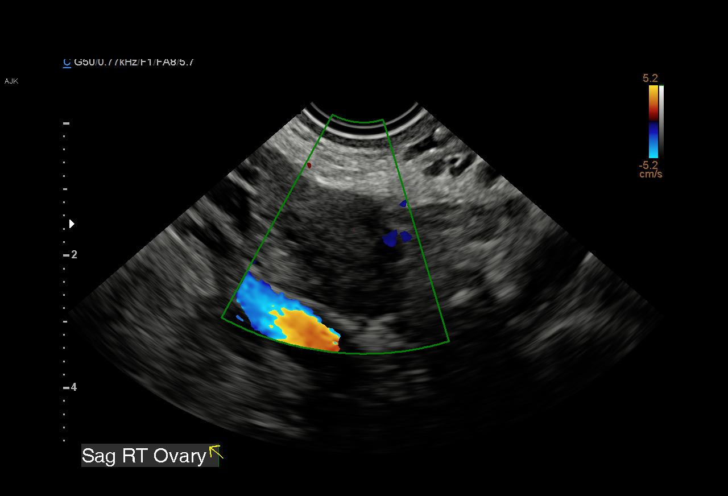
[im 59/76]
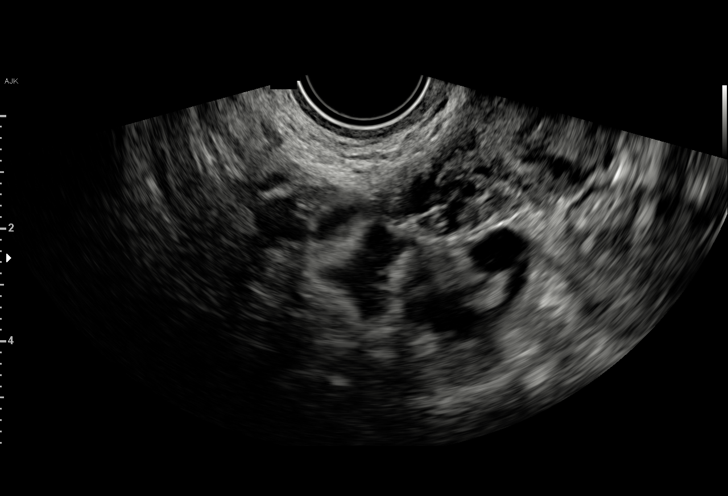
[im 64/76]
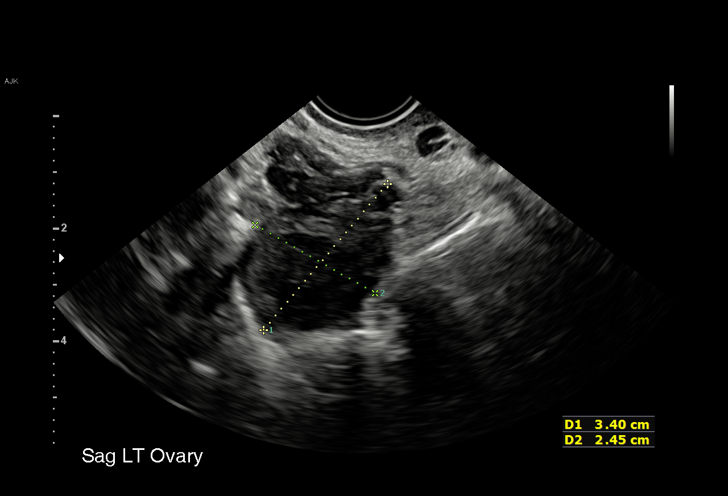
[im 70/76]
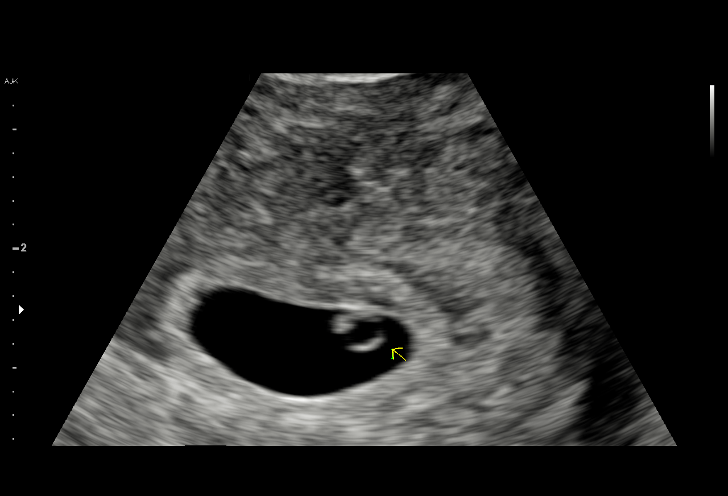
[im 76/76]
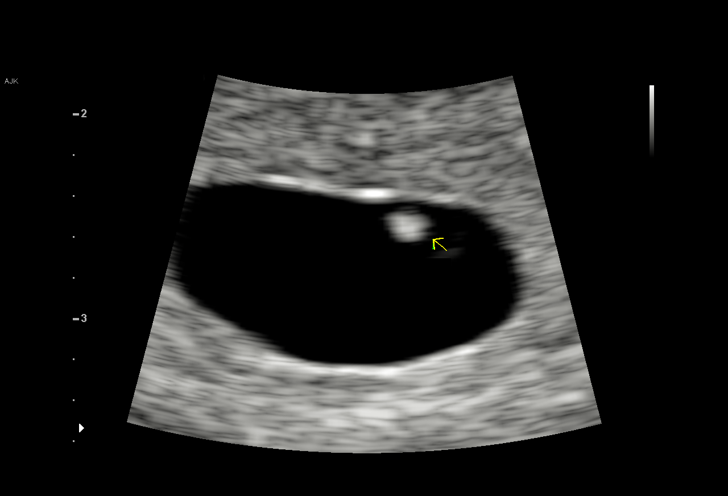

[15 of 28 positions shown; findings below may reference images not displayed]

FINDINGS: Intrauterine gestational sac: Single

Yolk sac:  Present

Embryo:  Present

Cardiac Activity: Present

Heart Rate: 92 bpm

CRL: 2.0 mm   5 w   5 d                  US EDC: 11/09/2020

Subchorionic hemorrhage:  None visualized.

Maternal uterus/adnexae: Ovaries are normal in appearance
bilaterally. No adnexal mass or free fluid.
IMPRESSION: 1. Single viable intrauterine pregnancy as above, estimated
gestational age 5 weeks and 5 days by crown-rump length, with
ultrasound EDC of 11/09/2020. No complication.
2. No other acute maternal uterine or adnexal abnormality
identified.

## 2021-11-19 DIAGNOSIS — Z419 Encounter for procedure for purposes other than remedying health state, unspecified: Secondary | ICD-10-CM | POA: Diagnosis not present

## 2021-12-03 ENCOUNTER — Ambulatory Visit (INDEPENDENT_AMBULATORY_CARE_PROVIDER_SITE_OTHER): Payer: Medicaid Other | Admitting: *Deleted

## 2021-12-03 ENCOUNTER — Telehealth: Payer: Self-pay | Admitting: *Deleted

## 2021-12-03 DIAGNOSIS — Z3201 Encounter for pregnancy test, result positive: Secondary | ICD-10-CM

## 2021-12-03 DIAGNOSIS — Z3200 Encounter for pregnancy test, result unknown: Secondary | ICD-10-CM

## 2021-12-03 DIAGNOSIS — O219 Vomiting of pregnancy, unspecified: Secondary | ICD-10-CM | POA: Diagnosis not present

## 2021-12-03 LAB — POCT PREGNANCY, URINE: Preg Test, Ur: POSITIVE — AB

## 2021-12-03 MED ORDER — PROMETHAZINE HCL 25 MG PO TABS: 25.0000 mg | ORAL_TABLET | Freq: Four times a day (QID) | ORAL | 1 refills | Status: DC | PRN

## 2021-12-03 NOTE — Telephone Encounter (Signed)
Reviewed medication Zyprexa with Dr. Shawnie Pons. Called patient to inform her we recommend she discuss with her psychiatrist re: risks/ benefits of taking Zyprexa in pregnancy and that risks of problems in first trimester not expected but only have limited data and that in third trimester more risk. We discussed they may want to consider changing meds. She voices understanding.  ?Nancy Fetter ? ?

## 2021-12-03 NOTE — Progress Notes (Signed)
Dropped off urine for pregnancy test which was positive. I called [name] and she reports sure LMP of 10/30/21. This makes her [redacted]w[redacted]d with EDD 08/06/22. I reviewed allergies and medications. I advised her I am not sure if Zyprexa is safe in pregnancy . She states not taking Zofran and I advised we do not recommend in first trimester. She would like other, phenergan rx sent in. I advised to start prenatal vitamins and prenatal care asap. She plans to go get care with [place] and I reviewed choices of prenatal care. She prefers traditional care. She will call for appointment. ?Nancy Fetter ?

## 2021-12-03 NOTE — Patient Instructions (Signed)
Prenatal Care Providers           [facility]  [address] [phone]  [facility]   [address]  [phone]  [facility] [place]       [address] [phone]            [facility] [place]     [address] [phone]          [facility] [place]   [address] [phone]  [facility]  [address] [phone]     [facility]  [address]   [phone]     [facility]  Phone: [phone]  [facility]  Phone: [phone]  [facility] Phone: [phone]  [facility] Phone: [phone]  [facility] Phone: [phone]  [facility] Phone: [phone]  [facility]  Phone: [phone]  

## 2021-12-07 DIAGNOSIS — F319 Bipolar disorder, unspecified: Secondary | ICD-10-CM | POA: Diagnosis not present

## 2021-12-14 ENCOUNTER — Encounter: Payer: Self-pay | Admitting: Certified Nurse Midwife

## 2021-12-15 ENCOUNTER — Inpatient Hospital Stay (HOSPITAL_COMMUNITY)
Admission: AD | Admit: 2021-12-15 | Discharge: 2021-12-15 | Disposition: A | Discharge status: Home / Self Care | Payer: Medicaid Other | Attending: Family Medicine | Admitting: Family Medicine

## 2021-12-15 ENCOUNTER — Encounter (HOSPITAL_COMMUNITY): Payer: Self-pay

## 2021-12-15 ENCOUNTER — Inpatient Hospital Stay (HOSPITAL_COMMUNITY): Payer: Medicaid Other

## 2021-12-15 DIAGNOSIS — O26891 Other specified pregnancy related conditions, first trimester: Secondary | ICD-10-CM | POA: Insufficient documentation

## 2021-12-15 DIAGNOSIS — Z3A01 Less than 8 weeks gestation of pregnancy: Secondary | ICD-10-CM | POA: Insufficient documentation

## 2021-12-15 DIAGNOSIS — R102 Pelvic and perineal pain: Secondary | ICD-10-CM | POA: Insufficient documentation

## 2021-12-15 DIAGNOSIS — O3680X0 Pregnancy with inconclusive fetal viability, not applicable or unspecified: Secondary | ICD-10-CM | POA: Diagnosis not present

## 2021-12-15 DIAGNOSIS — O208 Other hemorrhage in early pregnancy: Secondary | ICD-10-CM | POA: Diagnosis not present

## 2021-12-15 LAB — URINALYSIS, ROUTINE W REFLEX MICROSCOPIC
Bilirubin Urine: NEGATIVE
Glucose, UA: NEGATIVE mg/dL
Hgb urine dipstick: NEGATIVE
Ketones, ur: 5 mg/dL — AB
Nitrite: NEGATIVE
Protein, ur: 30 mg/dL — AB
Specific Gravity, Urine: 1.033 — ABNORMAL HIGH (ref 1.005–1.030)
pH: 5 (ref 5.0–8.0)

## 2021-12-15 LAB — WET PREP, GENITAL
Clue Cells Wet Prep HPF POC: NONE SEEN
Sperm: NONE SEEN
Trich, Wet Prep: NONE SEEN
WBC, Wet Prep HPF POC: <10 (ref <10)
Yeast Wet Prep HPF POC: NONE SEEN

## 2021-12-15 LAB — POCT PREGNANCY, URINE: Preg Test, Ur: POSITIVE — AB

## 2021-12-15 LAB — HIV ANTIBODY (ROUTINE TESTING W REFLEX): HIV Screen 4th Generation wRfx: NONREACTIVE

## 2021-12-15 LAB — CBC
HCT: 38.4 % (ref 36.0–46.0)
Hemoglobin: 12.7 g/dL (ref 12.0–15.0)
MCH: 29.1 pg (ref 26.0–34.0)
MCHC: 33.1 g/dL (ref 30.0–36.0)
MCV: 88.1 fL (ref 80.0–100.0)
Platelets: 315 10*3/uL (ref 150–400)
RBC: 4.36 MIL/uL (ref 3.87–5.11)
RDW: 13.4 % (ref 11.5–15.5)
WBC: 7.6 10*3/uL (ref 4.0–10.5)
nRBC: 0 % (ref 0.0–0.2)

## 2021-12-15 LAB — HCG, QUANTITATIVE, PREGNANCY: hCG, Beta Chain, Quant, S: 11319 m[IU]/mL — ABNORMAL HIGH (ref <5)

## 2021-12-15 NOTE — Discharge Instructions (Signed)
Alternative Vaginitis Therapies  1) soak in tub of warm water waist high with 1/2 cup of baking soda in water for ~ 20 mins.  2) soak 3 tampons in 1 tablespoon of fractionated (liquid form) coconut oil with 10 drops of Melaleuca (Tea Tree) essential oil, insert 1 saturated tampon vaginally at bedtime x 3 days.   Both options are to be done after sexual intercourse, menses and when suspects Bacterial Vaginosis and/or yeast infection. Please be advised that these alternatives will not replace the need to be evaluated, if symptoms persist. You will need to seek care at an OB/GYN provider.  GO WHITE: Soap: UNSCENTED Dove (white box light green writing) Laundry detergent (underwear)- Dreft or [employer] unscented WHITE 100% cotton panties (NOT just cotton crouch) Sanitary napkin/panty liners: UNSCENTED.  If it doesn't SAY unscented it can have a scent/perfume    NO PERFUMES OR LOTIONS OR POTIONS in the vulvar area (may use water-based or silicone-based lubricant) Condoms: hypoallergenic only. Non dyed (no color) Toilet papers: white only Wash clothes: use a separate wash cloth. WHITE.  Wash in Dreft.   You can purchase Tea Tree (Melaluca) Oil locally at:  [employer] [address] , [place] [address] [phone]  [employer] [address] , [place] [address] [phone]  

## 2021-12-15 NOTE — MAU Provider Note (Signed)
History     CSN: [id number]  Arrival date and time: 12/15/21 8563     Chief Complaint  Patient presents with   Pelvic Pain   HPI  [name] is a 26 y.o. year old G9P2002 female at [redacted]w[redacted]d weeks gestation who presents to MAU reporting LT sided pelvic pain that started 5 days ago and ammonia smell to vaginal area and urine. She reports having a h/o BV and thinks she may have it again. She last had SI 3-4 days ago. She denies VB or LOF.   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 OR;  Service: ENT;  Laterality: N/A;   FOREIGN BODY REMOVAL ESOPHAGEAL N/A 04/24/2014   Procedure: REMOVAL FOREIGN BODY ESOPHAGEAL;  Surgeon: [name], MD;  Location: [facility] OR;  Service: ENT;  Laterality: N/A;   WISDOM TOOTH EXTRACTION  2013    Family History  Problem Relation Age of Onset   Arthritis Mother    Bronchitis Mother    Asthma Mother    Diabetes Neg Hx    Stomach cancer Neg Hx    Colon cancer Neg Hx     Social History   Tobacco Use   Smoking status: Never   Smokeless tobacco: Never   Tobacco comments:    marjiuna  Vaping Use   Vaping Use: Never used  Substance Use Topics   Alcohol use: Not Currently    Alcohol/week: 6.0 standard drinks    Types: 6 Cans of beer per week   Drug use: Not Currently    Types: Marijuana, Cocaine    Comment: last  used before pregnancy    Allergies:  Allergies  Allergen Reactions   Bactrim [Sulfamethoxazole-Trimethoprim] Rash    No medications prior to admission.    Review of Systems  Constitutional: Negative.   HENT: Negative.    Eyes: Negative.   Respiratory: Negative.    Cardiovascular: Negative.   Gastrointestinal: Negative.   Endocrine: Negative.   Genitourinary:  Positive for pelvic pain. Vaginal discharge: "vaginal smells like ammonia". Musculoskeletal: Negative.   Skin: Negative.   Allergic/Immunologic: Negative.   Neurological: Negative.   Hematological: Negative.   Psychiatric/Behavioral: Negative.    Physical Exam   Blood pressure 124/69, pulse 93, temperature 98.3 F (36.8 C), temperature source Oral, resp. rate 14, height 5' (1.524 m), weight 76.7 kg, last menstrual period 10/30/2021, SpO2 100 %, not currently breastfeeding.  Physical Exam Vitals and nursing note reviewed.  Constitutional:      Appearance: Normal appearance.  Genitourinary:    General: Normal vulva.  Comments: Pelvic exam: External genitalia normal, SE: vaginal walls pink and well rugated, cervix is smooth, pink, no lesions, moderate amt of thick, white vaginal d/c -- WP, GC/CT done, cervix visually closed, Uterus is mildly tender, no CMT or friability, no adnexal tenderness. Musculoskeletal:        General: Normal range of motion.  Skin:    General: Skin is warm and dry.  Neurological:     Mental Status: She is alert and oriented to person, place, and time.  Psychiatric:        Mood and Affect: Mood normal.        Behavior: Behavior normal.        Thought Content: Thought content normal.        Judgment: Judgment normal.    MAU Course  Procedures  MDM CCUA UPT CBC ABO/Rh HCG Wet Prep GC/CT -- pending HIV -- pending OB < 14 wks [place] with TV  Results for orders placed or performed during the hospital encounter of 12/15/21 (from the past 24 hour(s))  Urinalysis, Routine w reflex  microscopic Urine, Clean Catch     Status: Abnormal   Collection Time: 12/15/21  8:55 AM  Result Value Ref Range   Color, Urine AMBER (A) YELLOW   APPearance CLOUDY (A) CLEAR   Specific Gravity, Urine 1.033 (H) 1.005 - 1.030   pH 5.0 5.0 - 8.0   Glucose, UA NEGATIVE NEGATIVE mg/dL   Hgb urine dipstick NEGATIVE NEGATIVE   Bilirubin Urine NEGATIVE NEGATIVE   Ketones, ur 5 (A) NEGATIVE mg/dL   Protein, ur 30 (A) NEGATIVE mg/dL   Nitrite NEGATIVE NEGATIVE   Leukocytes,Ua TRACE (A) NEGATIVE   RBC / HPF 0-5 0 - 5 RBC/hpf   WBC, UA 0-5 0 - 5 WBC/hpf   Bacteria, UA RARE (A) NONE SEEN   Squamous Epithelial / LPF 21-50 0 - 5   Mucus PRESENT   Pregnancy, urine POC     Status: Abnormal   Collection Time: 12/15/21  9:21 AM  Result Value Ref Range   Preg Test, Ur POSITIVE (A) NEGATIVE  Wet prep, genital     Status: None   Collection Time: 12/15/21 10:03 AM   Specimen: PATH Cytology Cervicovaginal Ancillary Only  Result Value Ref Range   Yeast Wet Prep HPF POC NONE SEEN NONE SEEN   Trich, Wet Prep NONE SEEN NONE SEEN   Clue Cells Wet Prep HPF POC NONE SEEN NONE SEEN   WBC, Wet Prep HPF POC <10 <10   Sperm NONE SEEN   CBC     Status: None   Collection Time: 12/15/21 10:14 AM  Result Value Ref Range   WBC 7.6 4.0 - 10.5 K/uL   RBC 4.36 3.87 - 5.11 MIL/uL   Hemoglobin 12.7 12.0 - 15.0 g/dL   HCT 66.0 63.0 - 16.0 %   MCV 88.1 80.0 - 100.0 fL   MCH 29.1 26.0 - 34.0 pg   MCHC 33.1 30.0 - 36.0 g/dL   RDW 10.9 32.3 - 55.7 %   Platelets 315 150 - 400 K/uL   nRBC 0.0 0.0 - 0.2 %  hCG, quantitative, pregnancy     Status: Abnormal   Collection Time: 12/15/21 10:14 AM  Result Value Ref Range   hCG, Beta Chain, Quant, S 11,319 (H) <5 mIU/mL  HIV Antibody (routine testing w rflx)     Status: None   Collection Time: 12/15/21 10:14 AM  Result Value Ref Range   HIV Screen 4th Generation wRfx  Non Reactive Non Reactive    US OB LESS THAN 14 WEEKS WITH OB TRANSVAG[place]  Result Date:  12/15/2021 CLINICAL DATA:  Pelvic pain. EXAM: OBSTETRIC <14 WK US AND TRANSVAGINAL OB US TECHNIQUE: Both transabdominal and transvaginal ultrasound examinations were performed for complete evaluation of the gestation as well as the maternal uterus, adnexal regions, and pelvic cul-de-sac. Transvaginal technique was performed to assess early pregnancy. COMPARISON:  None Available. FINDINGS: Intrauterine gestational sac: Single Yolk sac:  Not Visualized. Embryo:  Not Visualized. MSD: 8.6 mm   5 w   4 d Subchorionic hemorrhage:  Trace. Maternal uterus/adnexae: Normal IMPRESSION: 1. Gestational sac with mean sac diameter of 8.6 mm corresponding to a gestational age of [redacted] weeks and 4 days. No fetal pole identified, likely secondary to early gestation. 2.  Trace subchorionic hemorrhage. 3.  Uterus and adnexa are unremarkable. Electronically Signed   By: Larose HiresImran  Ahmed D.O.   On: 12/15/2021 11:30     *Consult with Dr. Shawnie PonsPratt @ 1415 - notified of patient's complaints, assessments, lab & U/S results, tx plan d/c home, viability U/S in 10 days - ok to d/c home, agrees with plan  Assessment and Plan  Pelvic pain affecting pregnancy in first trimester, antepartum  - Information provided on pelvic pain in pregnancy   Pregnancy with uncertain fetal viability, single or unspecified fetus - Repeat U/S in 10 days for viability. Order placed with D/C orders  [redacted] weeks gestation of pregnancy   - Discharge patient - Someone from U/S dept will call to get U/S scheduled - Patient verbalized an understanding of the plan of care and agrees.    Raelyn Moraolitta Aleana Fifita, CNM 12/15/2021, 9:24 PM

## 2021-12-15 NOTE — MAU Note (Signed)
Patient arrived to MAU from home c/o sharp left sided pelvic pain that started 5 day sago  with ammonia smelling urine. Patient stated that she had sex 5 days ago. Denies vaginal bleeding and or leakage of fluid.  Pain 8/10

## 2021-12-17 ENCOUNTER — Inpatient Hospital Stay (HOSPITAL_COMMUNITY): Payer: Medicaid Other

## 2021-12-17 LAB — CULTURE, OB URINE: Culture: 70000 — AB

## 2021-12-18 ENCOUNTER — Other Ambulatory Visit: Payer: Self-pay

## 2021-12-18 DIAGNOSIS — O219 Vomiting of pregnancy, unspecified: Secondary | ICD-10-CM

## 2021-12-18 LAB — GC/CHLAMYDIA PROBE AMP (~~LOC~~) NOT AT ARMC
Chlamydia: NEGATIVE
Comment: NEGATIVE
Comment: NORMAL
Neisseria Gonorrhea: NEGATIVE

## 2021-12-18 MED ORDER — PROMETHAZINE HCL 25 MG PO TABS: 25.0000 mg | ORAL_TABLET | Freq: Four times a day (QID) | ORAL | 0 refills | Status: DC | PRN

## 2021-12-20 ENCOUNTER — Other Ambulatory Visit: Payer: Self-pay | Admitting: *Deleted

## 2021-12-20 DIAGNOSIS — Z419 Encounter for procedure for purposes other than remedying health state, unspecified: Secondary | ICD-10-CM | POA: Diagnosis not present

## 2021-12-20 DIAGNOSIS — O219 Vomiting of pregnancy, unspecified: Secondary | ICD-10-CM

## 2021-12-20 MED ORDER — SCOPOLAMINE 1 MG/3DAYS TD PT72: 1.0000 | MEDICATED_PATCH | TRANSDERMAL | 1 refills | Status: DC

## 2021-12-24 DIAGNOSIS — O210 Mild hyperemesis gravidarum: Secondary | ICD-10-CM | POA: Diagnosis not present

## 2021-12-24 DIAGNOSIS — O2691 Pregnancy related conditions, unspecified, first trimester: Secondary | ICD-10-CM | POA: Diagnosis not present

## 2021-12-24 DIAGNOSIS — O219 Vomiting of pregnancy, unspecified: Secondary | ICD-10-CM | POA: Diagnosis not present

## 2021-12-24 DIAGNOSIS — Z3A01 Less than 8 weeks gestation of pregnancy: Secondary | ICD-10-CM | POA: Diagnosis not present

## 2021-12-24 DIAGNOSIS — R1110 Vomiting, unspecified: Secondary | ICD-10-CM | POA: Diagnosis not present

## 2021-12-26 ENCOUNTER — Other Ambulatory Visit: Payer: Medicaid Other

## 2021-12-26 ENCOUNTER — Telehealth: Payer: Self-pay

## 2021-12-26 DIAGNOSIS — Z3A01 Less than 8 weeks gestation of pregnancy: Secondary | ICD-10-CM | POA: Diagnosis not present

## 2021-12-26 DIAGNOSIS — R112 Nausea with vomiting, unspecified: Secondary | ICD-10-CM | POA: Diagnosis not present

## 2021-12-26 DIAGNOSIS — O218 Other vomiting complicating pregnancy: Secondary | ICD-10-CM | POA: Diagnosis not present

## 2021-12-26 NOTE — Telephone Encounter (Signed)
Transition Care Management Unsuccessful Follow-up Telephone Call  Date of discharge and from where:  12/25/2021 from [facility]  Attempts:  1st Attempt  Reason for unsuccessful TCM follow-up call:  Left voice message

## 2021-12-27 ENCOUNTER — Telehealth: Payer: Self-pay

## 2021-12-27 NOTE — Telephone Encounter (Signed)
Transition Care Management Follow-up Telephone Call Date of discharge and from where: 12/24/2021 from [facility] How have you been since you were released from the hospital? Patient stated that she is feeling the same. Patient stated that nausea and vomiting occur even when she has not eaten. Patient stated that she understands the ED return precautions given by [facility].  Any questions or concerns? No  Items Reviewed: Did the pt receive and understand the discharge instructions provided? Yes  Medications obtained and verified? Yes  Other? No  Any new allergies since your discharge? No  Dietary orders reviewed? No Do you have support at home? Yes   Functional Questionnaire: (I = Independent and D = Dependent) ADLs: I  Bathing/Dressing- I  Meal Prep- I  Eating- I  Maintaining continence- I  Transferring/Ambulation- I  Managing Meds- I   Follow up appointments reviewed:  PCP Hospital f/u appt confirmed? No   Specialist Hospital f/u appt confirmed? Yes  Scheduled to see OBGYN on 01/08/2022 @ 11:15am. Are transportation arrangements needed? No  If their condition worsens, is the pt aware to call PCP or go to the Emergency Dept.? Yes Was the patient provided with contact information for the PCP's office or ED? Yes Was to pt encouraged to call back with questions or concerns? Yes

## 2021-12-29 ENCOUNTER — Inpatient Hospital Stay (HOSPITAL_COMMUNITY)
Admission: AD | Admit: 2021-12-29 | Discharge: 2021-12-29 | Disposition: A | Discharge status: Home / Self Care | Payer: Medicaid Other | Attending: Obstetrics and Gynecology | Admitting: Obstetrics and Gynecology

## 2021-12-29 ENCOUNTER — Encounter (HOSPITAL_COMMUNITY): Payer: Self-pay | Admitting: Obstetrics and Gynecology

## 2021-12-29 ENCOUNTER — Other Ambulatory Visit: Payer: Self-pay

## 2021-12-29 DIAGNOSIS — O210 Mild hyperemesis gravidarum: Secondary | ICD-10-CM | POA: Diagnosis not present

## 2021-12-29 DIAGNOSIS — Z3A08 8 weeks gestation of pregnancy: Secondary | ICD-10-CM | POA: Insufficient documentation

## 2021-12-29 DIAGNOSIS — Z3200 Encounter for pregnancy test, result unknown: Secondary | ICD-10-CM

## 2021-12-29 DIAGNOSIS — O219 Vomiting of pregnancy, unspecified: Secondary | ICD-10-CM

## 2021-12-29 LAB — URINALYSIS, ROUTINE W REFLEX MICROSCOPIC
Glucose, UA: NEGATIVE mg/dL
Hgb urine dipstick: NEGATIVE
Ketones, ur: >80 mg/dL — AB
Nitrite: NEGATIVE
Protein, ur: 30 mg/dL — AB
Specific Gravity, Urine: >1.03 — ABNORMAL HIGH (ref 1.005–1.030)
pH: 6 (ref 5.0–8.0)

## 2021-12-29 LAB — URINALYSIS, MICROSCOPIC (REFLEX)

## 2021-12-29 MED ORDER — LACTATED RINGERS IV BOLUS
1000.0000 mL | Freq: Once | INTRAVENOUS | Status: AC
  Administered 2021-12-29: 1000 mL via INTRAVENOUS

## 2021-12-29 MED ORDER — ONDANSETRON HCL 4 MG/2ML IJ SOLN
4.0000 mg | Freq: Once | INTRAMUSCULAR | Status: AC
  Administered 2021-12-29: 4 mg via INTRAVENOUS
  Filled 2021-12-29: qty 2

## 2021-12-29 MED ORDER — SCOPOLAMINE 1 MG/3DAYS TD PT72
1.0000 | MEDICATED_PATCH | TRANSDERMAL | Status: DC
Start: 2021-12-29 — End: 2021-12-29
  Administered 2021-12-29: 1.5 mg via TRANSDERMAL
  Filled 2021-12-29: qty 1

## 2021-12-29 MED ORDER — METOCLOPRAMIDE HCL 5 MG/ML IJ SOLN
10.0000 mg | Freq: Once | INTRAMUSCULAR | Status: AC
  Administered 2021-12-29: 10 mg via INTRAVENOUS
  Filled 2021-12-29: qty 2

## 2021-12-29 MED ORDER — FAMOTIDINE 20 MG PO TABS: 20.0000 mg | ORAL_TABLET | Freq: Two times a day (BID) | ORAL | 2 refills | Status: DC

## 2021-12-29 MED ORDER — PROCHLORPERAZINE MALEATE 10 MG PO TABS
10.0000 mg | ORAL_TABLET | Freq: Two times a day (BID) | ORAL | 2 refills | Status: DC | PRN
Start: 2021-12-29 — End: 2022-02-22

## 2021-12-29 MED ORDER — M.V.I. ADULT IV INJ
Freq: Once | INTRAVENOUS | Status: AC
  Filled 2021-12-29: qty 10

## 2021-12-29 MED ORDER — SCOPOLAMINE 1 MG/3DAYS TD PT72: 1.0000 | MEDICATED_PATCH | TRANSDERMAL | 3 refills | Status: DC

## 2021-12-29 MED ORDER — METOCLOPRAMIDE HCL 10 MG PO TABS: 10.0000 mg | ORAL_TABLET | Freq: Three times a day (TID) | ORAL | 2 refills | Status: DC | PRN

## 2021-12-29 MED ORDER — ONDANSETRON 8 MG PO TBDP: 8.0000 mg | ORAL_TABLET | Freq: Three times a day (TID) | ORAL | 2 refills | Status: DC | PRN

## 2021-12-29 MED ORDER — FAMOTIDINE IN NACL 20-0.9 MG/50ML-% IV SOLN
20.0000 mg | Freq: Once | INTRAVENOUS | Status: AC
  Administered 2021-12-29: 20 mg via INTRAVENOUS
  Filled 2021-12-29: qty 50

## 2021-12-29 NOTE — Discharge Instructions (Signed)

## 2021-12-29 NOTE — MAU Note (Signed)
Leah [name] is a 26 y.o. at [redacted]w[redacted]d here in MAU reporting: Pt reports she has hyperemesis and throw up every day. Pt reports she last took her medications yesterday and that none of them work.  Onset of complaint: weeks ago  Pain score: 9/10 abdomen  There were no vitals filed for this visit.    Lab orders placed from triage:  ua

## 2021-12-29 NOTE — MAU Provider Note (Signed)
History     CSN: [id number]  Arrival date and time: 12/29/21 1428   Event Date/Time   First Provider Initiated Contact with Patient 12/29/21 1451      Chief Complaint  Patient presents with   Emesis   HPI  Leah [name] is a 26 y.o. G3P2002 at [redacted]w[redacted]d who presents for evaluation of nausea and vomiting. Patient reports she had hyperemesis in her last pregnancy and knows this is happening again. She reports she is throwing up more times than she can count and is actively heaving upon arrival to MAU.  She reports she has tried phenergan and diclegis with no relief.   She was seen in the [place] ED on 6/5 and treated with fluids and given a prescription for phenergan suppositories that she did not pick up. She was seen again in the urgent care on 6/7  and given fluids, pepcid and IM zofran.  She denies any pain. She denies any vaginal bleeding, discharge, and leaking of fluid. Denies any constipation, diarrhea or any urinary complaints. Reports normal fetal movement.   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    Past Surgical History:  Procedure Laterality Date   CHOLECYSTECTOMY N/A 09/28/2014   Procedure: LAPAROSCOPIC CHOLECYSTECTOMY;  Surgeon: Axel Filler, MD;  Location: [facility] OR;  Service: General;  Laterality: N/A;   DIRECT LARYNGOSCOPY N/A 04/24/2014   Procedure: DIRECT LARYNGOSCOPY;  Surgeon: [name], MD;  Location: [facility] OR;  Service: ENT;  Laterality: N/A;   FOREIGN BODY REMOVAL ESOPHAGEAL N/A 04/24/2014   Procedure: REMOVAL FOREIGN BODY ESOPHAGEAL;  Surgeon: [name], MD;  Location: [facility] OR;  Service: ENT;  Laterality: N/A;   WISDOM TOOTH EXTRACTION  2013     Family History  Problem Relation Age of Onset   Arthritis Mother    Bronchitis Mother    Asthma Mother    Diabetes Neg Hx    Stomach cancer Neg Hx    Colon cancer Neg Hx     Social History   Tobacco Use   Smoking status: Never   Smokeless tobacco: Never   Tobacco comments:    marjiuna  Vaping Use   Vaping Use: Never used  Substance Use Topics   Alcohol use: Not Currently    Alcohol/week: 6.0 standard drinks of alcohol    Types: 6 Cans of beer per week   Drug use: Not Currently    Types: Marijuana, Cocaine    Comment: last used before pregnancy    Allergies:  Allergies  Allergen Reactions   Bactrim [Sulfamethoxazole-Trimethoprim] Rash    Medications Prior to Admission  Medication Sig Dispense Refill Last Dose   Prenatal Vit-Fe Fumarate-FA (PRENATAL VITAMIN) 27-0.8 MG TABS Take 1 tablet by mouth daily. 90 tablet 3 12/28/2021   promethazine (PHENERGAN) 25 MG tablet Take 1 tablet (25 mg total) by mouth every 6 (six) hours as needed for nausea or  vomiting. 30 tablet 1 12/28/2021   promethazine (PHENERGAN) 25 MG tablet Take 1 tablet (25 mg total) by mouth every 6 (six) hours as needed for nausea or vomiting. 30 tablet 0    scopolamine (TRANSDERM-SCOP) 1 MG/3DAYS Place 1 patch (1.5 mg total) onto the skin every 3 (three) days. 4 patch 1 12/28/2021   ondansetron (ZOFRAN-ODT) 4 MG disintegrating tablet Take 4 mg by mouth every 8 (eight) hours as needed. (Patient not taking: Reported on 12/03/2021)       Review of Systems  Constitutional: Negative.  Negative for fatigue and fever.  HENT: Negative.    Respiratory: Negative.  Negative for shortness of breath.   Cardiovascular: Negative.  Negative for chest pain.  Gastrointestinal:  Positive for nausea and vomiting. Negative for abdominal pain, constipation and diarrhea.  Genitourinary: Negative.  Negative for dysuria, vaginal bleeding and vaginal discharge.  Neurological: Negative.  Negative for dizziness and headaches.    Physical Exam   Blood pressure 114/68, pulse 93, temperature 98.1 F (36.7 C), resp. rate 16, last menstrual period 10/30/2021, SpO2 100 %, not currently breastfeeding.  Patient Vitals for the past 24 hrs:  BP Temp Pulse Resp SpO2  12/29/21 1448 114/68 98.1 F (36.7 C) 93 16 100 %    Physical Exam Vitals and nursing note reviewed.  Constitutional:      General: She is not in acute distress.    Appearance: She is well-developed. She is ill-appearing.  HENT:     Head: Normocephalic.  Eyes:     Pupils: Pupils are equal, round, and reactive to light.  Cardiovascular:     Rate and Rhythm: Normal rate and regular rhythm.     Heart sounds: Normal heart sounds.  Pulmonary:     Effort: Pulmonary effort is normal. No respiratory distress.     Breath sounds: Normal breath sounds.  Abdominal:     General: Bowel sounds are normal. There is no distension.     Palpations: Abdomen is soft.     Tenderness: There is no abdominal tenderness.  Skin:    General: Skin is warm and dry.  Neurological:     Mental Status: She is alert and oriented to person, place, and time.  Psychiatric:        Mood and Affect: Mood normal.        Behavior: Behavior normal.        Thought Content: Thought content normal.        Judgment: Judgment normal.     MAU Course  Procedures  Results for orders placed or performed during the hospital encounter of 12/29/21 (from the past 24 hour(s))  Urinalysis, Routine w reflex microscopic Urine, Clean Catch     Status: Abnormal   Collection Time: 12/29/21  2:31 PM  Result Value Ref Range   Color, Urine YELLOW YELLOW   APPearance CLEAR CLEAR   Specific Gravity, Urine >1.030 (H) 1.005 - 1.030   pH 6.0 5.0 - 8.0   Glucose, UA NEGATIVE NEGATIVE mg/dL   Hgb urine dipstick NEGATIVE NEGATIVE   Bilirubin Urine SMALL (A) NEGATIVE   Ketones, ur >80 (A) NEGATIVE mg/dL   Protein, ur 30 (A) NEGATIVE mg/dL   Nitrite NEGATIVE NEGATIVE   Leukocytes,Ua SMALL (A) NEGATIVE   Urinalysis, Microscopic (reflex)     Status: Abnormal   Collection Time: 12/29/21  2:31 PM  Result Value Ref Range   RBC / HPF 0-5 0 - 5 RBC/hpf   WBC, UA 11-20 0 - 5 WBC/hpf  Bacteria, UA MANY (A) NONE SEEN   Squamous Epithelial / LPF 21-50 0 - 5   Mucus PRESENT     MDM Labs ordered and reviewed.   UA LR bolus Pepcid Zofran Reglan Scop patch  Patient called out requesting [place] sandwich but minimal time had passed since antiemetics were given. Patient instructed to wait to give time for medication to work.   Patient sleeping upon reassessment. Given UA results, will give second bag of fluid  Multivitamin Bag  Patient offered crackers and something to drink and patient declines  Assessment and Plan   1. Hyperemesis arising during pregnancy   2. [redacted] weeks gestation of pregnancy   3. Possible pregnancy   4. Nausea and vomiting during pregnancy    -Discharge home in stable condition -Rx for zofran, pepcid, compazine, reglan and scop patches sent to pharmacy -First trimester precautions discussed -Patient advised to follow-up with OB as scheduled for prenatal care -Patient may return to MAU as needed or if her condition were to change or worsen  Rolm BookbinderCaroline M Willy Vorce, CNM 12/29/2021, 2:51 PM

## 2021-12-30 NOTE — Telephone Encounter (Addendum)
Called pt in regards to her MyChart message.  Pt reports that she has been going to Urgent Care and the ED, has received fluids and IV Pepcid and nothing is working for her nausea.  I advised pt that she should go to MAU for evaluation as they are specialized in OB and will have more options of medication for her.  Pt states that the patch, Zofran, nor Phenergan is effective and she is tired of going to the ED.  I strongly encouraged pt to go MAU as she has not been seen by our office as her NEW OB intake appt is not scheduled until 01/08/22.  Pt voiced that she would go to MAU.   Leonette Nutting  12/27/21

## 2021-12-31 ENCOUNTER — Other Ambulatory Visit: Payer: Medicaid Other

## 2021-12-31 DIAGNOSIS — O99351 Diseases of the nervous system complicating pregnancy, first trimester: Secondary | ICD-10-CM | POA: Diagnosis not present

## 2021-12-31 DIAGNOSIS — Z9049 Acquired absence of other specified parts of digestive tract: Secondary | ICD-10-CM | POA: Diagnosis not present

## 2021-12-31 DIAGNOSIS — Z743 Need for continuous supervision: Secondary | ICD-10-CM | POA: Diagnosis not present

## 2021-12-31 DIAGNOSIS — R1084 Generalized abdominal pain: Secondary | ICD-10-CM | POA: Diagnosis not present

## 2021-12-31 DIAGNOSIS — R1010 Upper abdominal pain, unspecified: Secondary | ICD-10-CM | POA: Diagnosis not present

## 2021-12-31 DIAGNOSIS — O218 Other vomiting complicating pregnancy: Secondary | ICD-10-CM | POA: Diagnosis not present

## 2021-12-31 DIAGNOSIS — F319 Bipolar disorder, unspecified: Secondary | ICD-10-CM | POA: Diagnosis not present

## 2021-12-31 DIAGNOSIS — Z3A08 8 weeks gestation of pregnancy: Secondary | ICD-10-CM | POA: Diagnosis not present

## 2021-12-31 DIAGNOSIS — O26891 Other specified pregnancy related conditions, first trimester: Secondary | ICD-10-CM | POA: Diagnosis not present

## 2021-12-31 DIAGNOSIS — R569 Unspecified convulsions: Secondary | ICD-10-CM | POA: Diagnosis not present

## 2021-12-31 DIAGNOSIS — R112 Nausea with vomiting, unspecified: Secondary | ICD-10-CM | POA: Diagnosis not present

## 2021-12-31 DIAGNOSIS — I959 Hypotension, unspecified: Secondary | ICD-10-CM | POA: Diagnosis not present

## 2021-12-31 DIAGNOSIS — O99341 Other mental disorders complicating pregnancy, first trimester: Secondary | ICD-10-CM | POA: Diagnosis not present

## 2022-01-08 ENCOUNTER — Telehealth (INDEPENDENT_AMBULATORY_CARE_PROVIDER_SITE_OTHER): Payer: Medicaid Other

## 2022-01-08 DIAGNOSIS — Z3A00 Weeks of gestation of pregnancy not specified: Secondary | ICD-10-CM

## 2022-01-08 DIAGNOSIS — Z3480 Encounter for supervision of other normal pregnancy, unspecified trimester: Secondary | ICD-10-CM | POA: Diagnosis not present

## 2022-01-08 MED ORDER — BLOOD PRESSURE MONITORING DEVI: 1.0000 | 0 refills | Status: DC

## 2022-01-08 NOTE — Progress Notes (Signed)
New OB Intake  I connected with  Leah [name] on 01/08/22 at 11:15 AM EDT by MyChart Video Visit and verified that I am speaking with the correct person using two identifiers. Nurse is located at [facility] and pt is located at [facility].  I discussed the limitations, risks, security and privacy concerns of performing an evaluation and management service by telephone and the availability of in person appointments. I also discussed with the patient that there may be a patient responsible charge related to this service. The patient expressed understanding and agreed to proceed.  I explained I am completing New OB Intake today. We discussed her EDD of 08/06/22 that is based on LMP of 10/30/21. Pt is G3/P2. I reviewed her allergies, medications, Medical/Surgical/OB history, and appropriate screenings. I informed her of [facility] services. Based on history, this is a/an  pregnancy uncomplicated .   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   MyChart/Babyscripts MyChart access verified. I explained pt will have some visits in office and some virtually. Babyscripts instructions given and order placed. Patient verifies receipt of registration text/e-mail. Account successfully created and app downloaded.  Blood Pressure Cuff  Blood pressure cuff ordered for patient to pick-up from [employer]. Explained after first prenatal appt pt will check weekly and document in Babyscripts.  Weight scale: Patient does / does not  have weight scale. Weight scale ordered for patient to pick up from [employer].   Anatomy US Explained first scheduled [place] will  be around 19 weeks. Anatomy US scheduled for  at . Pt notified to arrive at . Scheduled AFP lab only appointment if CenteringPregnancy pt for same day as anatomy US.   Labs Discussed Avelina Laine genetic screening with patient. Would like both Panorama and Horizon drawn at new OB visit.Also if interested in genetic testing, tell patient she will need AFP 15-21 weeks to complete genetic testing .Routine prenatal labs needed.  Covid Vaccine Patient has covid vaccine.   Is patient a CenteringPregnancy candidate?  Declined Declined due to NA Not a candidate due to NA Centering Patient" indicated on sticky note   Is patient a Mom+Baby Combined Care candidate?  Not a candidate    Scheduled with Mom+Baby provider    Is patient interested in Waterbirth?  No   "Interested in [employer] - Schedule next visit with CNM" on sticky note  Informed patient of [facility] website  and placed link in her AVS.   Social Determinants of Health Food Insecurity: Patient denies food insecurity. WIC Referral: Patient is interested in referral to [facility].  Transportation: Patient denies transportation needs. Childcare: Discussed no children allowed at ultrasound appointments. Offered childcare services; patient declines childcare services at this time.  Send link to Pregnancy Navigators   Placed OB Box on problem list and updated  First visit review I reviewed new OB appt with pt. I explained she will have a pelvic exam, ob bloodwork with genetic screening, and PAP smear. Explained pt will be seen by Edd Arbour, CNM at first visit; encounter routed to appropriate provider. Explained that patient will be seen by pregnancy navigator  following visit with provider. [facility] information placed in AVS.   [name], CMA 01/08/2022  11:35 AM

## 2022-01-09 ENCOUNTER — Ambulatory Visit
Admission: RE | Admit: 2022-01-09 | Discharge: 2022-01-09 | Disposition: A | Discharge status: Home / Self Care | Payer: Medicaid Other | Source: Ambulatory Visit | Attending: Obstetrics and Gynecology | Admitting: Obstetrics and Gynecology

## 2022-01-09 ENCOUNTER — Other Ambulatory Visit (HOSPITAL_COMMUNITY): Payer: Self-pay | Admitting: Obstetrics and Gynecology

## 2022-01-09 ENCOUNTER — Encounter: Payer: Self-pay | Admitting: Certified Nurse Midwife

## 2022-01-09 DIAGNOSIS — R102 Pelvic and perineal pain: Secondary | ICD-10-CM | POA: Insufficient documentation

## 2022-01-09 DIAGNOSIS — O26891 Other specified pregnancy related conditions, first trimester: Secondary | ICD-10-CM | POA: Insufficient documentation

## 2022-01-09 DIAGNOSIS — Z3A08 8 weeks gestation of pregnancy: Secondary | ICD-10-CM | POA: Diagnosis not present

## 2022-01-10 ENCOUNTER — Telehealth (INDEPENDENT_AMBULATORY_CARE_PROVIDER_SITE_OTHER): Payer: Medicaid Other | Admitting: Psychiatry

## 2022-01-10 ENCOUNTER — Encounter (HOSPITAL_COMMUNITY): Payer: Self-pay | Admitting: Psychiatry

## 2022-01-10 DIAGNOSIS — F319 Bipolar disorder, unspecified: Secondary | ICD-10-CM

## 2022-01-10 NOTE — Progress Notes (Signed)
BH MD/PA/NP OP Progress Note Virtual Visit via Telephone Note  I connected with Leah [name] on 01/10/22 at  3:00 PM EDT by telephone and verified that I am speaking with the correct person using two identifiers.  Location: Patient: home Provider: Clinic   I discussed the limitations, risks, security and privacy concerns of performing an evaluation and management service by telephone and the availability of in person appointments. I also discussed with the patient that there may be a patient responsible charge related to this service. The patient expressed understanding and agreed to proceed.   I provided 30 minutes of non-face-to-face time during this encounter.     01/10/2022 10:50 AM Leah [name]  MRN:  [id number]  Chief Complaint: "I am [redacted] weeks pregnant"  HPI: 26 year old female seen today for follow up psychiatric evaluation.  She has a psychiatric history of depression, bipolar disorder, and SI/SA.  She is currently managed on Zyprexa 5 mg.  She notes that discontinued her medications after she found out that she was pregnant.    Today was unable to logon virtually sources was done over the phone.  During exam she was pleasant, cooperative, and engaged in conversation.  She informed Clinical research associate that she is currently [redacted] weeks pregnant and has discontinued Zyprexa.  She notes that she feels mentally stable however reports that she is tired most days due to the pregnancy.  She also informed Clinical research associate that she has been nauseous and has a poor appetite.  Today she denies SI/HI/VAH, mania, or paranoia.    She informed Clinical research associate that her anxiety and depression are well managed.  Provider conducted a GAD-7 and patient scored a 6.  Provider also conducted PHQ-9 and patient scored a 9.    At this time patient does not want to restart medications.  She informed that she will follow-up after she delivers her child.  No other concerns at this time.   Visit Diagnosis:    ICD-10-CM   1. Bipolar 1 disorder  (HCC)  F31.9        Past Psychiatric History: depression, bipolar disorder, and SI/SA.  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    Past Surgical History:  Procedure Laterality Date   CHOLECYSTECTOMY N/A 09/28/2014   Procedure: LAPAROSCOPIC CHOLECYSTECTOMY;  Surgeon: Axel Filler, MD;  Location: [facility] OR;  Service: General;  Laterality: N/A;   DIRECT LARYNGOSCOPY N/A 04/24/2014   Procedure: DIRECT LARYNGOSCOPY;  Surgeon: [name], MD;  Location[facility] OR;  Service: ENT;  Laterality: N/A;   FOREIGN BODY REMOVAL ESOPHAGEAL N/A 04/24/2014   Procedure: REMOVAL FOREIGN BODY ESOPHAGEAL;  Surgeon: [name], MD;  Location: [facility] OR;  Service: ENT;  Laterality: N/A;   WISDOM TOOTH EXTRACTION  2013    Family Psychiatric History: Denies  Family History:  Family History  Problem Relation Age of Onset   Arthritis Mother    Bronchitis Mother    Asthma Mother    Diabetes Neg Hx    Stomach cancer Neg Hx    Colon cancer Neg Hx     Social History:  Social History   Socioeconomic History   Marital status: Single    Spouse name: Not on file   Number of children: 2   Years of education: 12 th    Highest  education level: Not on file  Occupational History   Occupation: works on Therapist, 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   Occupation: PCA    Comment: Engineer, agricultural  Tobacco Use   Smoking status: Never   Smokeless tobacco: Never   Tobacco comments:    Musician Use   Vaping Use: Never used  Substance and Sexual Activity   Alcohol use: Not Currently    Alcohol/week: 6.0 standard drinks of alcohol    Types: 6 Cans of beer per week   Drug use: Not Currently    Types: Marijuana, Cocaine    Comment: last used before pregnancy   Sexual activity: Yes    Partners: Male    Birth control/protection: None  Other Topics Concern   Not on  file  Social History Narrative   Not on file   Social Determinants of Health   Financial Resource Strain: Low Risk  (07/31/2020)   Overall Financial Resource Strain (CARDIA)    Difficulty of Paying Living Expenses: Not hard at all  Food Insecurity: No Food Insecurity (09/21/2021)   Hunger Vital Sign    Worried About Running Out of Food in the Last Year: Never true    Ran Out of Food in the Last Year: Never true  Transportation Needs: No Transportation Needs (09/21/2021)   PRAPARE - Administrator, Civil Service (Medical): No    Lack of Transportation (Non-Medical): No  Physical Activity: Inactive (07/31/2020)   Exercise Vital Sign    Days of Exercise per Week: 0 days    Minutes of Exercise per Session: 0 min  Stress: No Stress Concern Present (07/31/2020)   [employer] of Occupational Health - Occupational Stress Questionnaire    Feeling of Stress : Only a little  Social Connections: Socially Isolated (07/31/2020)   Social Connection and Isolation Panel [NHANES]    Frequency of Communication with Friends and Family: Once a week    Frequency of Social Gatherings with Friends and Family: Never    Attends Religious Services: Never    Database administrator or Organizations: No    Attends Banker Meetings: Never    Marital Status: Never married    Allergies:  Allergies  Allergen Reactions   Bactrim [Sulfamethoxazole-Trimethoprim] Rash    Metabolic Disorder Labs: Lab Results  Component Value Date   HGBA1C 5.6 04/11/2020   MPG 105.41 12/31/2018   MPG 103 08/22/2016   Lab Results  Component Value Date   PROLACTIN 18.9 10/01/2017   PROLACTIN 6.4 11/18/2014   Lab Results  Component Value Date   CHOL 159 12/31/2018   TRIG 31 12/31/2018   HDL 57 12/31/2018   CHOLHDL 2.8 12/31/2018   VLDL 6 12/31/2018   LDLCALC 96 12/31/2018   LDLCALC 82 08/22/2016   Lab Results  Component Value Date   TSH 0.940 12/31/2018   TSH 0.809 10/01/2017     Therapeutic Level Labs: No results found for: "LITHIUM" No results found for: "VALPROATE" No results found for: "CBMZ"  Current Medications: Current Outpatient Medications  Medication Sig Dispense Refill   Blood Pressure Monitoring DEVI 1 each by Does not apply route once a week. 1 each 0   famotidine (PEPCID) 20 MG tablet Take 1 tablet (20 mg total) by mouth 2 (two) times daily. 60 tablet 2   metoCLOPramide (REGLAN) 10 MG tablet Take 1 tablet (10 mg total) by mouth every 8 (eight) hours as needed for nausea. 30 tablet 2   ondansetron (ZOFRAN-ODT) 8  MG disintegrating tablet Take 1 tablet (8 mg total) by mouth every 8 (eight) hours as needed for nausea or vomiting. 30 tablet 2   Prenatal Vit-Fe Fumarate-FA (PRENATAL VITAMIN) 27-0.8 MG TABS Take 1 tablet by mouth daily. 90 tablet 3   prochlorperazine (COMPAZINE) 10 MG tablet Take 1 tablet (10 mg total) by mouth 2 (two) times daily as needed for nausea or vomiting. 30 tablet 2   promethazine (PHENERGAN) 25 MG tablet Take 1 tablet (25 mg total) by mouth every 6 (six) hours as needed for nausea or vomiting. 30 tablet 1   scopolamine (TRANSDERM-SCOP) 1 MG/3DAYS Place 1 patch (1.5 mg total) onto the skin every 3 (three) days. 10 patch 3   No current facility-administered medications for this visit.     Musculoskeletal: Strength & Muscle Tone:  Unable to assess due to telephone visit Gait & Station:  Unable to assess due to telephone visit Patient leans: N/A  Psychiatric Specialty Exam: Review of Systems  Last menstrual period 10/30/2021, not currently breastfeeding.There is no height or weight on file to calculate BMI.  General Appearance:  Unable to assess due to telephone visit  Eye Contact:   Unable to assess due to telephone visit  Speech:  Clear and Coherent and Normal Rate  Volume:  Normal  Mood:  Euthymic  Affect:  Appropriate and Congruent  Thought Process:  Coherent, Goal Directed and Linear  Orientation:  Full (Time,  Place, and Person)  Thought Content: WDL and Logical   Suicidal Thoughts:  No  Homicidal Thoughts:  No  Memory:  Immediate;   Good Recent;   Good Remote;   Good  Judgement:  Good  Insight:  Good  Psychomotor Activity:   Unable to assess due to telephone visit  Concentration:  Concentration: Good and Attention Span: Good  Recall:  Good  Fund of Knowledge: Good  Language: Good  Akathisia:   Unable to assess due to telephone visit  Handed:  Right  AIMS (if indicated): Not done  Assets:  Communication Skills Desire for Improvement Financial Resources/Insurance Housing Physical Health Social Support Vocational/Educational  ADL's:  Intact  Cognition: WNL  Sleep:  Good   Screenings: AIMS    Flowsheet Row [facility]) [facility]  AIMS Total Score 0 0      AUDIT    Flowsheet Row Admission (Discharged) [facility] (Discharged) from 08/19/2016 in [facility] Ad[facility] ([facility]) [facility]  Alcohol Use Disorder Identification Test Final Score (AUDIT) 1 1 8       GAD-7    Flowsheet Row Video Visit from 01/10/2022 in [facility] Procedure visit from 09/21/2021 in Center for 11/21/2021 Healthcare at [facility] Video Visit from 08/07/2021 in [facility] Clinical Support from 05/01/2021 in [facility] for [facility] at [facility] Video Visit from 04/05/2021 in [facility]  Total GAD-7 Score 6 4 4 5 7       PHQ2-9    Flowsheet Row Video Visit from 01/10/2022 in [facility] Procedure visit from 09/21/2021 in Center for [facility]  Healthcare at [facility] for Women Video Visit from 08/07/2021 in [facility] Clinical Support from 05/01/2021 in  Center for [facility] Healthcare at [facility] Video Visit from 04/05/2021 in [facility]  PHQ-2 Total Score 2 1 2 1 2   PHQ-9 Total Score 9 4 10 2 9       Flowsheet Row Video Visit from 01/10/2022 in [facility] Admission (Discharged) from 12/29/2021 in [facility] 1S Maternity Assessment Unit Admission (Discharged) from 12/15/2021 in [facility] 1S Maternity Assessment Unit  C-SSRS RISK CATEGORY No Risk No Risk No Risk        Assessment and Plan: Patient reports that she discontinued her medication after finding out she was pregnant.  At this time patient does not want to restart medications.  She informed that she will follow-up after she delivers her child.  No other concerns at this time.  1. Bipolar 1 disorder (HCC)   Follow-up in 3 months  12/17/2021, NP 01/10/2022, 10:50 AM

## 2022-01-14 DIAGNOSIS — F319 Bipolar disorder, unspecified: Secondary | ICD-10-CM | POA: Diagnosis not present

## 2022-01-16 ENCOUNTER — Other Ambulatory Visit: Payer: Self-pay

## 2022-01-16 ENCOUNTER — Other Ambulatory Visit (HOSPITAL_COMMUNITY)
Admission: RE | Admit: 2022-01-16 | Discharge: 2022-01-16 | Disposition: A | Discharge status: Home / Self Care | Payer: Medicaid Other | Source: Ambulatory Visit | Attending: Certified Nurse Midwife | Admitting: Certified Nurse Midwife

## 2022-01-16 ENCOUNTER — Encounter: Payer: Self-pay | Admitting: Certified Nurse Midwife

## 2022-01-16 ENCOUNTER — Ambulatory Visit (INDEPENDENT_AMBULATORY_CARE_PROVIDER_SITE_OTHER): Payer: Medicaid Other

## 2022-01-16 VITALS — BP 104/62 | HR 78 | Wt 166.0 lb

## 2022-01-16 DIAGNOSIS — N898 Other specified noninflammatory disorders of vagina: Secondary | ICD-10-CM | POA: Insufficient documentation

## 2022-01-16 NOTE — Progress Notes (Signed)
Patient here today with complaint of green vaginal discharge. I instructed patient on how to collect a self swab. Self swab collected without issue. I explained to patient we will notify her with any abnormal results. Patient verbalized understanding and denies any other questions.   Alesia Richards, RN 01/16/22

## 2022-01-17 LAB — CERVICOVAGINAL ANCILLARY ONLY
Bacterial Vaginitis (gardnerella): NEGATIVE
Candida Glabrata: NEGATIVE
Candida Vaginitis: POSITIVE — AB
Chlamydia: NEGATIVE
Comment: NEGATIVE
Comment: NEGATIVE
Comment: NEGATIVE
Comment: NEGATIVE
Comment: NEGATIVE
Comment: NORMAL
Neisseria Gonorrhea: NEGATIVE
Trichomonas: NEGATIVE

## 2022-01-18 DIAGNOSIS — O99341 Other mental disorders complicating pregnancy, first trimester: Secondary | ICD-10-CM | POA: Diagnosis not present

## 2022-01-18 DIAGNOSIS — O26851 Spotting complicating pregnancy, first trimester: Secondary | ICD-10-CM | POA: Diagnosis not present

## 2022-01-18 DIAGNOSIS — O4691 Antepartum hemorrhage, unspecified, first trimester: Secondary | ICD-10-CM | POA: Diagnosis not present

## 2022-01-18 DIAGNOSIS — E860 Dehydration: Secondary | ICD-10-CM | POA: Diagnosis not present

## 2022-01-18 DIAGNOSIS — O26891 Other specified pregnancy related conditions, first trimester: Secondary | ICD-10-CM | POA: Diagnosis not present

## 2022-01-18 DIAGNOSIS — O468X1 Other antepartum hemorrhage, first trimester: Secondary | ICD-10-CM | POA: Diagnosis not present

## 2022-01-18 DIAGNOSIS — Z3A10 10 weeks gestation of pregnancy: Secondary | ICD-10-CM | POA: Diagnosis not present

## 2022-01-18 DIAGNOSIS — F319 Bipolar disorder, unspecified: Secondary | ICD-10-CM | POA: Diagnosis not present

## 2022-01-18 DIAGNOSIS — Z79899 Other long term (current) drug therapy: Secondary | ICD-10-CM | POA: Diagnosis not present

## 2022-01-19 ENCOUNTER — Other Ambulatory Visit: Payer: Self-pay | Admitting: Certified Nurse Midwife

## 2022-01-19 DIAGNOSIS — B3731 Acute candidiasis of vulva and vagina: Secondary | ICD-10-CM

## 2022-01-19 DIAGNOSIS — Z419 Encounter for procedure for purposes other than remedying health state, unspecified: Secondary | ICD-10-CM | POA: Diagnosis not present

## 2022-01-19 MED ORDER — TERCONAZOLE 0.4 % VA CREA: 1.0000 | TOPICAL_CREAM | Freq: Every day | VAGINAL | 0 refills | Status: DC

## 2022-01-19 NOTE — Progress Notes (Signed)
Yeast present on new OB vaginal swab, terconazole sent to pharmacy.

## 2022-01-21 ENCOUNTER — Telehealth: Payer: Self-pay | Admitting: Obstetrics and Gynecology

## 2022-01-21 NOTE — Patient Outreach (Signed)
Transitional Care Management Follow-up Telephone Call  [name] was discharged from the Maternal Assessment Unit, has a scheduled new OB appointment with the [facility] provider on 01/23/22 and has been referred for ongoing follow up.   Kathi Der RN, BSN [place]  [employer] Engineer, production - Managed Medicaid High Risk [phone]

## 2022-01-23 ENCOUNTER — Ambulatory Visit (INDEPENDENT_AMBULATORY_CARE_PROVIDER_SITE_OTHER): Payer: Medicaid Other | Admitting: Certified Nurse Midwife

## 2022-01-23 ENCOUNTER — Other Ambulatory Visit: Payer: Self-pay

## 2022-01-23 VITALS — BP 110/70 | HR 84 | Wt 165.0 lb

## 2022-01-23 DIAGNOSIS — R45851 Suicidal ideations: Secondary | ICD-10-CM | POA: Diagnosis not present

## 2022-01-23 DIAGNOSIS — O99351 Diseases of the nervous system complicating pregnancy, first trimester: Secondary | ICD-10-CM

## 2022-01-23 DIAGNOSIS — O210 Mild hyperemesis gravidarum: Secondary | ICD-10-CM

## 2022-01-23 DIAGNOSIS — G40909 Epilepsy, unspecified, not intractable, without status epilepticus: Secondary | ICD-10-CM

## 2022-01-23 DIAGNOSIS — Z3A12 12 weeks gestation of pregnancy: Secondary | ICD-10-CM

## 2022-01-23 DIAGNOSIS — O0991 Supervision of high risk pregnancy, unspecified, first trimester: Secondary | ICD-10-CM | POA: Diagnosis not present

## 2022-01-23 DIAGNOSIS — Z3491 Encounter for supervision of normal pregnancy, unspecified, first trimester: Secondary | ICD-10-CM | POA: Diagnosis not present

## 2022-01-23 DIAGNOSIS — Z3480 Encounter for supervision of other normal pregnancy, unspecified trimester: Secondary | ICD-10-CM

## 2022-01-23 LAB — POCT URINALYSIS DIP (DEVICE)
Bilirubin Urine: NEGATIVE
Glucose, UA: NEGATIVE mg/dL
Hgb urine dipstick: NEGATIVE
Nitrite: NEGATIVE
Protein, ur: 30 mg/dL — AB
Specific Gravity, Urine: 1.025 (ref 1.005–1.030)
Urobilinogen, UA: 1 mg/dL (ref 0.0–1.0)
pH: 7 (ref 5.0–8.0)

## 2022-01-23 NOTE — Progress Notes (Addendum)
History:   Leah [name] is a 26 y.o. G3P2002 at [redacted]w[redacted]d by early ultrasound being seen today for her first obstetrical visit.  Her obstetrical history is significant for  history of severe hyperemesis gravidarum, iron deficiency anemia and severe depression with suicidal ideation but no intent . Patient does intend to breast feed and wants a BTL after this delivery. Pregnancy history fully reviewed.  Patient reports  continued nausea/vomiting unresponsive to all meds. Cannot eat anything despite taking pepcid, compazine, reglan and zofran daily as ordered with phenergan occasionally. Diclegis has been tried but does not work, it makes her feel worse. In past pregnancies, tri-weekly infusions helped but this is not an option as she now lives in [place] and works 8a-5p.  Because of this, she is having more depressive feelings with suicidal ideation but no intent. Already seeing [facility].   HISTORY: OB History  Gravida Para Term Preterm AB Living  3 2 2  0 0 2  SAB IAB Ectopic Multiple Live Births  0 0 0 0 2    # Outcome Date GA Lbr Len/2nd Weight Sex Delivery Anes PTL Lv  3 Current           2 Term 10/22/20 [redacted]w[redacted]d / 00:09 6 lb 10 oz (3.005 kg) F Vag-Spont EPI  LIV     Name: Nold,GIRL Shamiyah     Apgar1: 8  Apgar5: 9  1 Term 03/14/13 [redacted]w[redacted]d 17:30 / 00:33 6 lb 6.1 oz (2.895 kg) M Vag-Spont EPI  LIV     Birth Comments: none     Name: Jayden      Apgar1: 8  Apgar5: 9    Last pap smear was done 09/21/21 and was normal  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   Past Surgical History:  Procedure Laterality Date   CHOLECYSTECTOMY N/A 09/28/2014   Procedure: LAPAROSCOPIC CHOLECYSTECTOMY;  Surgeon: 11/28/2014, MD;  Location: [facility] OR;  Service: General;  Laterality: N/A;   DIRECT LARYNGOSCOPY N/A 04/24/2014   Procedure: DIRECT LARYNGOSCOPY;  Surgeon: 06/24/2014, MD;  Location[facility] OR;  Service: ENT;  Laterality: N/A;   FOREIGN BODY REMOVAL ESOPHAGEAL N/A 04/24/2014   Procedure: REMOVAL FOREIGN BODY ESOPHAGEAL;  Surgeon: 06/24/2014, MD;  Location: [facility] OR;  Service: ENT;  Laterality: N/A;   WISDOM TOOTH EXTRACTION  2013   Family History  Problem Relation Age of Onset   Arthritis Mother    Bronchitis Mother    Asthma Mother    Diabetes Neg Hx    Stomach cancer Neg Hx    Colon cancer Neg Hx    Social History   Tobacco Use   Smoking status: Never   Smokeless tobacco: Never   Tobacco comments:    marjiuna  Vaping Use   Vaping Use: Never used  Substance Use Topics   Alcohol use: Not Currently    Alcohol/week: 6.0 standard drinks of alcohol    Types: 6 Cans of beer per week   Drug use: Not Currently    Types: Marijuana, Cocaine    Comment: last used before pregnancy   Allergies  Allergen Reactions   Bactrim [Sulfamethoxazole-Trimethoprim] Rash   Current Outpatient Medications on File Prior to Visit  Medication Sig Dispense Refill   Blood Pressure Monitoring DEVI 1 each by Does not apply route  once a week. 1 each 0   famotidine (PEPCID) 20 MG tablet Take 1 tablet (20 mg total) by mouth 2 (two) times daily. 60 tablet 2   metoCLOPramide (REGLAN) 10 MG tablet Take 1 tablet (10 mg total) by mouth every 8 (eight) hours as needed for nausea. 30 tablet 2   ondansetron (ZOFRAN-ODT) 8 MG disintegrating tablet Take 1 tablet (8 mg total) by mouth every 8 (eight) hours as needed for nausea or vomiting. 30 tablet 2   prochlorperazine (COMPAZINE) 10 MG tablet Take 1 tablet (10 mg total) by mouth 2 (two) times daily as needed for nausea or vomiting. 30 tablet 2   promethazine (PHENERGAN) 25 MG tablet Take 1 tablet (25 mg total) by mouth every 6 (six) hours as needed for nausea or vomiting. 30 tablet 1   PROMETHEGAN 25 MG suppository Place rectally.     scopolamine (TRANSDERM-SCOP) 1 MG/3DAYS Place 1 patch (1.5 mg total) onto the skin every 3 (three)  days. 10 patch 3   Prenatal Vit-Fe Fumarate-FA (PRENATAL VITAMIN) 27-0.8 MG TABS Take 1 tablet by mouth daily. (Patient not taking: Reported on 01/23/2022) 90 tablet 3   terconazole (TERAZOL 7) 0.4 % vaginal cream Place 1 applicator vaginally at bedtime. (Patient not taking: Reported on 01/23/2022) 45 g 0   No current facility-administered medications on file prior to visit.    Review of Systems Pertinent items noted in HPI and remainder of comprehensive ROS otherwise negative. Physical Exam:   Vitals:   01/23/22 0857  BP: 110/70  Pulse: 84  Weight: 165 lb (74.8 kg)   Fetal Heart Rate (bpm): 160 Bedside Ultrasound for FHR check: Viable intrauterine pregnancy with positive cardiac activity and movement noted.  Patient informed that the ultrasound is considered a limited obstetric ultrasound and is not intended to be a complete ultrasound exam.  Patient also informed that the ultrasound is not being completed with the intent of assessing for fetal or placental anomalies or any pelvic abnormalities.  Explained that the purpose of today's ultrasound is to assess for fetal heart rate.  Patient acknowledges the purpose of the exam and the limitations of the study.  Constitutional: Well-developed, well-nourished pregnant female in no acute distress.  HEENT: PERRLA Skin: normal color and turgor, no rash Cardiovascular: normal rate & rhythm Respiratory: normal effort GI: Abd soft, non-tender, gravid appropriate for gestational age MS: Extremities nontender, no edema, normal ROM Neurologic: Alert and oriented x 4.  GU: no CVA tenderness Pelvic: exam deferred  Assessment & Plan:  1. Supervision of high risk pregnancy in first trimester - Panorama Prenatal Test Full Panel - CBC/D/Plt+RPR+Rh+ABO+RubIgG... - HgB A1c - Culture, OB Urine - POCT urinalysis dip (device)  2. [redacted] weeks gestation of pregnancy - Routine OB care   3. Hyperemesis gravidarum - Pt is taking all medications as ordered  but still unable to eat and severely nauseous - Pt down 14lbs since start of pregnancy but cannot go to infusions during the week. Spoke to Dr. Macon Large who agreed pt could be admitted to [facility] today for fluids/steroid taper but pt declined due to childcare and work demands. Stated she could go on Friday. - Can present to MAU for evaluation and possible [facility]SC admission if symptoms continue to be as severe. - Comprehensive metabolic panel - TSH Rfx on Abnormal to Free T4  4. 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 Per chart review, last encounter addressing seizure activity specifically was in October  2022. Seizure activity was deemed pseudoseizures and no meds were prescribed. Pt should report to closest ED if additional seizure activity. - Of note, was seen for possible stroke-like behavior in November 2022 and January 2023. Given tPa in November, but MRI clear in January so no tPa given. Fully cleared by neuro consult at that time. Symptoms attributed to complex migraines.  5. Suicidal ideation - Already referred to and seeing psychiatry  6. Initial obstetric visit in first trimester - Initial labs drawn. - Continue prenatal vitamins. - Problem list reviewed and updated. - Genetic Screening discussed, First trimester screen, Quad screen, and NIPS: ordered. - Ultrasound discussed; fetal anatomic survey: ordered. - Anticipatory guidance about prenatal visits given including labs, ultrasounds, and testing. - Discussed usage of Babyscripts and virtual visits as additional source of managing and completing prenatal visits in midst of coronavirus and pandemic.   - Encouraged to complete MyChart Registration for her ability to review results, send requests, and have questions addressed.  - The nature of [place] [facility]Faculty Practice with multiple MDs and Advanced Practice Providers was explained to patient; also  emphasized that residents, students are part of our team. - Routine obstetric precautions reviewed. Encouraged to seek out care at office or emergency room [facility] preferred) for urgent and/or emergent concerns.  Return in about 4 weeks (around 02/20/2022) for IN-PERSON, HOB.    Edd Arbour, MSN, CNM, IBCLC Certified Nurse Midwife, [facility]

## 2022-01-23 NOTE — Progress Notes (Signed)
SI/thoughts of harm positive on PHQ-9; otherwise negative. Denies any plan to end life within past 2 weeks. Report given to [place], CNM.  Fleet Contras RN 01/23/22

## 2022-01-24 LAB — CBC/D/PLT+RPR+RH+ABO+RUBIGG...
Antibody Screen: NEGATIVE
Basophils Absolute: 0 10*3/uL (ref 0.0–0.2)
Basos: 0 %
EOS (ABSOLUTE): 0.1 10*3/uL (ref 0.0–0.4)
Eos: 1 %
HCV Ab: NONREACTIVE
HIV Screen 4th Generation wRfx: NONREACTIVE
Hematocrit: 37.5 % (ref 34.0–46.6)
Hemoglobin: 12.2 g/dL (ref 11.1–15.9)
Hepatitis B Surface Ag: NEGATIVE
Immature Grans (Abs): 0 10*3/uL (ref 0.0–0.1)
Immature Granulocytes: 0 %
Lymphocytes Absolute: 2 10*3/uL (ref 0.7–3.1)
Lymphs: 23 %
MCH: 28.6 pg (ref 26.6–33.0)
MCHC: 32.5 g/dL (ref 31.5–35.7)
MCV: 88 fL (ref 79–97)
Monocytes Absolute: 0.4 10*3/uL (ref 0.1–0.9)
Monocytes: 5 %
Neutrophils Absolute: 6 10*3/uL (ref 1.4–7.0)
Neutrophils: 71 %
Platelets: 255 10*3/uL (ref 150–450)
RBC: 4.26 x10E6/uL (ref 3.77–5.28)
RDW: 14.6 % (ref 11.7–15.4)
RPR Ser Ql: NONREACTIVE
Rh Factor: POSITIVE
Rubella Antibodies, IGG: 1.82 index (ref >0.99)
WBC: 8.6 10*3/uL (ref 3.4–10.8)

## 2022-01-24 LAB — COMPREHENSIVE METABOLIC PANEL
ALT: 33 IU/L — ABNORMAL HIGH (ref 0–32)
AST: 22 IU/L (ref 0–40)
Albumin/Globulin Ratio: 1.3 (ref 1.2–2.2)
Albumin: 4 g/dL (ref 3.9–5.0)
Alkaline Phosphatase: 76 IU/L (ref 44–121)
BUN/Creatinine Ratio: 17 (ref 9–23)
BUN: 9 mg/dL (ref 6–20)
Bilirubin Total: 0.3 mg/dL (ref 0.0–1.2)
CO2: 19 mmol/L — ABNORMAL LOW (ref 20–29)
Calcium: 9.4 mg/dL (ref 8.7–10.2)
Chloride: 101 mmol/L (ref 96–106)
Creatinine, Ser: 0.54 mg/dL — ABNORMAL LOW (ref 0.57–1.00)
Globulin, Total: 3 g/dL (ref 1.5–4.5)
Glucose: 81 mg/dL (ref 70–99)
Potassium: 4.2 mmol/L (ref 3.5–5.2)
Sodium: 136 mmol/L (ref 134–144)
Total Protein: 7 g/dL (ref 6.0–8.5)
eGFR: 129 mL/min/{1.73_m2} (ref >59)

## 2022-01-24 LAB — HEMOGLOBIN A1C
Est. average glucose Bld gHb Est-mCnc: 108 mg/dL
Hgb A1c MFr Bld: 5.4 % (ref 4.8–5.6)

## 2022-01-24 LAB — TSH RFX ON ABNORMAL TO FREE T4: TSH: 0.727 u[IU]/mL (ref 0.450–4.500)

## 2022-01-24 LAB — HCV INTERPRETATION

## 2022-01-25 ENCOUNTER — Encounter: Payer: Self-pay | Admitting: Certified Nurse Midwife

## 2022-01-25 ENCOUNTER — Encounter: Payer: Self-pay | Admitting: Family Medicine

## 2022-01-25 LAB — CULTURE, OB URINE

## 2022-01-25 LAB — URINE CULTURE, OB REFLEX

## 2022-01-28 ENCOUNTER — Encounter: Payer: Self-pay | Admitting: *Deleted

## 2022-01-28 DIAGNOSIS — Z113 Encounter for screening for infections with a predominantly sexual mode of transmission: Secondary | ICD-10-CM | POA: Diagnosis not present

## 2022-01-28 DIAGNOSIS — Z3A11 11 weeks gestation of pregnancy: Secondary | ICD-10-CM | POA: Diagnosis not present

## 2022-01-28 DIAGNOSIS — O09899 Supervision of other high risk pregnancies, unspecified trimester: Secondary | ICD-10-CM | POA: Diagnosis not present

## 2022-01-28 LAB — PANORAMA PRENATAL TEST FULL PANEL:PANORAMA TEST PLUS 5 ADDITIONAL MICRODELETIONS: FETAL FRACTION: 12.3

## 2022-02-04 ENCOUNTER — Encounter: Payer: Self-pay | Admitting: General Practice

## 2022-02-04 DIAGNOSIS — F319 Bipolar disorder, unspecified: Secondary | ICD-10-CM | POA: Diagnosis not present

## 2022-02-18 DIAGNOSIS — F319 Bipolar disorder, unspecified: Secondary | ICD-10-CM | POA: Diagnosis not present

## 2022-02-19 ENCOUNTER — Encounter: Payer: Self-pay | Admitting: Certified Nurse Midwife

## 2022-02-19 DIAGNOSIS — Z419 Encounter for procedure for purposes other than remedying health state, unspecified: Secondary | ICD-10-CM | POA: Diagnosis not present

## 2022-02-20 ENCOUNTER — Ambulatory Visit (INDEPENDENT_AMBULATORY_CARE_PROVIDER_SITE_OTHER): Payer: Medicaid Other | Admitting: Certified Nurse Midwife

## 2022-02-20 ENCOUNTER — Other Ambulatory Visit: Payer: Self-pay

## 2022-02-20 VITALS — BP 104/68 | HR 80 | Wt 168.2 lb

## 2022-02-20 DIAGNOSIS — O99891 Other specified diseases and conditions complicating pregnancy: Secondary | ICD-10-CM

## 2022-02-20 DIAGNOSIS — O210 Mild hyperemesis gravidarum: Secondary | ICD-10-CM

## 2022-02-20 DIAGNOSIS — Z3A14 14 weeks gestation of pregnancy: Secondary | ICD-10-CM

## 2022-02-20 DIAGNOSIS — O0992 Supervision of high risk pregnancy, unspecified, second trimester: Secondary | ICD-10-CM

## 2022-02-20 DIAGNOSIS — Z5941 Food insecurity: Secondary | ICD-10-CM

## 2022-02-20 DIAGNOSIS — N393 Stress incontinence (female) (male): Secondary | ICD-10-CM

## 2022-02-20 NOTE — Progress Notes (Signed)
   PRENATAL VISIT NOTE  Subjective:  Leah [name] is a 26 y.o. G3P2002 at [redacted]w[redacted]d being seen today for ongoing prenatal care.  She is currently monitored for the following issues for this low-risk pregnancy and has Anemia, iron deficiency; Suicide attempt by drug ingestion (HCC); Severe recurrent major depression without psychotic features (HCC); Bipolar 1 disorder, depressed (HCC); Hemiplegic migraine; and Supervision of other normal pregnancy, antepartum on their problem list.  Patient reports  continued nausea and vomiting, meds unhelpful so she has stopped taking everything .  Contractions: Not present. Vag. Bleeding: None.  Movement: Absent. Denies leaking of fluid.   The following portions of the patient's history were reviewed and updated as appropriate: allergies, current medications, past family history, past medical history, past social history, past surgical history and problem list.   Objective:   Vitals:   02/20/22 0838  BP: 104/68  Pulse: 80  Weight: 168 lb 3.2 oz (76.3 kg)    Fetal Status:     Movement: Absent     General:  Alert, oriented and cooperative. Patient is in no acute distress.  Skin: Skin is warm and dry. No rash noted.   Cardiovascular: Normal heart rate noted  Respiratory: Normal respiratory effort, no problems with respiration noted  Abdomen: Soft, gravid, appropriate for gestational age.  Pain/Pressure: Absent     Pelvic: Cervical exam deferred        Extremities: Normal range of motion.  Edema: None  Mental Status: Normal mood and affect. Normal behavior. Normal judgment and thought content.   Pt informed that the ultrasound is considered a limited OB ultrasound and is not intended to be a complete ultrasound exam.  Patient also informed that the ultrasound is not being completed with the intent of assessing for fetal or placental anomalies or any pelvic abnormalities.  Explained that the purpose of today's ultrasound is to assess for  viability.  Patient  acknowledges the purpose of the exam and the limitations of the study.    Assessment and Plan:  Pregnancy: G3P2002 at [redacted]w[redacted]d 1. Supervision of high risk pregnancy in second trimester - Feeling bad overall but excited to hear the heartbeat and see the baby on the u/s today  2. [redacted] weeks gestation of pregnancy - Routine OB care   3. Hyperemesis gravidarum - Continues to be bad, but weight up 3lbs since last visit  4. Stress incontinence during pregnancy - Offered pelvic floor physical therapy but pt cannot make appts - Reviewed importance of kegels and instructed on how to do pelvic reset to help stress incontinence  5. Food insecurity - Curator given for [employer]  Preterm labor symptoms and general obstetric precautions including but not limited to vaginal bleeding, contractions, leaking of fluid and fetal movement were reviewed in detail with the patient. Please refer to After Visit Summary for other counseling recommendations.   Return in about 4 weeks (around 03/20/2022) for IN-PERSON, LOB.  Future Appointments  Date Time Provider Department Center  03/21/2022  8:35 AM Madlyn Frankel [facility]  03/22/2022  8:15 AM [facility] NURSE [facility]  03/22/2022  8:30 AM [facility] US3 [facility]    Bernerd Limbo, CNM

## 2022-02-25 ENCOUNTER — Encounter: Payer: Self-pay | Admitting: *Deleted

## 2022-02-25 ENCOUNTER — Encounter: Payer: Self-pay | Admitting: Certified Nurse Midwife

## 2022-02-25 ENCOUNTER — Other Ambulatory Visit: Payer: Self-pay | Admitting: Certified Nurse Midwife

## 2022-02-25 DIAGNOSIS — O210 Mild hyperemesis gravidarum: Secondary | ICD-10-CM

## 2022-02-25 NOTE — Progress Notes (Signed)
Orders placed for twice weekly IV infusions. Edd Arbour, CNM, MSN, IBCLC Certified Nurse Midwife, [facility]

## 2022-02-26 ENCOUNTER — Telehealth: Payer: Self-pay

## 2022-02-26 NOTE — Telephone Encounter (Signed)
LR + Decadron twice weekly infusions schedule with [facility] for Tue/Fri. First appt scheduled 03/19/22 at patients request due to needing to give work 3 weeks notice.

## 2022-03-05 ENCOUNTER — Telehealth: Payer: Self-pay | Admitting: Family Medicine

## 2022-03-05 NOTE — Telephone Encounter (Signed)
Patient called in wanting to move her appointment from 9/1 to 9/25 to align with her ultrasound. I told the patient that would create a large gap in her prenatal care and she stated she already knew that but they needed to be on the same day for her. I gave the patient our number and told her to give [place] a call if she decides to make nay changes to her appointment.

## 2022-03-07 ENCOUNTER — Telehealth (HOSPITAL_COMMUNITY): Payer: Medicaid Other | Admitting: Psychiatry

## 2022-03-18 DIAGNOSIS — Z3A18 18 weeks gestation of pregnancy: Secondary | ICD-10-CM | POA: Diagnosis not present

## 2022-03-18 DIAGNOSIS — F319 Bipolar disorder, unspecified: Secondary | ICD-10-CM | POA: Diagnosis not present

## 2022-03-18 DIAGNOSIS — O3503X0 Maternal care for (suspected) central nervous system malformation or damage in fetus, choroid plexus cysts, not applicable or unspecified: Secondary | ICD-10-CM | POA: Diagnosis not present

## 2022-03-18 DIAGNOSIS — O09899 Supervision of other high risk pregnancies, unspecified trimester: Secondary | ICD-10-CM | POA: Diagnosis not present

## 2022-03-18 DIAGNOSIS — Z3A11 11 weeks gestation of pregnancy: Secondary | ICD-10-CM | POA: Diagnosis not present

## 2022-03-19 ENCOUNTER — Encounter (HOSPITAL_COMMUNITY): Payer: Medicaid Other

## 2022-03-21 ENCOUNTER — Encounter: Payer: Medicaid Other | Admitting: Student

## 2022-03-21 DIAGNOSIS — F319 Bipolar disorder, unspecified: Secondary | ICD-10-CM | POA: Diagnosis not present

## 2022-03-21 DIAGNOSIS — Z882 Allergy status to sulfonamides status: Secondary | ICD-10-CM | POA: Diagnosis not present

## 2022-03-21 DIAGNOSIS — R102 Pelvic and perineal pain: Secondary | ICD-10-CM | POA: Diagnosis not present

## 2022-03-21 DIAGNOSIS — O26892 Other specified pregnancy related conditions, second trimester: Secondary | ICD-10-CM | POA: Diagnosis not present

## 2022-03-21 DIAGNOSIS — Z3A18 18 weeks gestation of pregnancy: Secondary | ICD-10-CM | POA: Diagnosis not present

## 2022-03-22 ENCOUNTER — Other Ambulatory Visit: Payer: Self-pay

## 2022-03-22 ENCOUNTER — Ambulatory Visit: Payer: Medicaid Other

## 2022-03-22 ENCOUNTER — Encounter: Payer: Medicaid Other | Admitting: Advanced Practice Midwife

## 2022-03-22 ENCOUNTER — Encounter (HOSPITAL_COMMUNITY): Payer: Medicaid Other

## 2022-03-22 DIAGNOSIS — Z419 Encounter for procedure for purposes other than remedying health state, unspecified: Secondary | ICD-10-CM | POA: Diagnosis not present

## 2022-04-03 DIAGNOSIS — O99019 Anemia complicating pregnancy, unspecified trimester: Secondary | ICD-10-CM | POA: Diagnosis not present

## 2022-04-03 DIAGNOSIS — O210 Mild hyperemesis gravidarum: Secondary | ICD-10-CM | POA: Diagnosis not present

## 2022-04-03 DIAGNOSIS — O26892 Other specified pregnancy related conditions, second trimester: Secondary | ICD-10-CM | POA: Diagnosis not present

## 2022-04-03 DIAGNOSIS — R42 Dizziness and giddiness: Secondary | ICD-10-CM | POA: Diagnosis not present

## 2022-04-03 DIAGNOSIS — Z3A20 20 weeks gestation of pregnancy: Secondary | ICD-10-CM | POA: Diagnosis not present

## 2022-04-08 ENCOUNTER — Encounter: Payer: Self-pay | Admitting: Certified Nurse Midwife

## 2022-04-08 ENCOUNTER — Telehealth: Payer: Self-pay | Admitting: Family Medicine

## 2022-04-08 NOTE — Telephone Encounter (Signed)
Patient called in saying she needs to cancel her appointment due to her job not letting her off work. I explained to the patient this appointment was super important and to please give [place] a call the second she finds a date she can come back in. Patient agreed and said she would let us know.

## 2022-04-15 ENCOUNTER — Other Ambulatory Visit: Payer: Self-pay

## 2022-04-15 ENCOUNTER — Ambulatory Visit: Payer: Medicaid Other

## 2022-04-15 ENCOUNTER — Encounter: Payer: Medicaid Other | Admitting: Obstetrics and Gynecology

## 2022-04-21 DIAGNOSIS — Z419 Encounter for procedure for purposes other than remedying health state, unspecified: Secondary | ICD-10-CM | POA: Diagnosis not present

## 2022-04-24 ENCOUNTER — Encounter: Payer: Self-pay | Admitting: Certified Nurse Midwife

## 2022-04-29 DIAGNOSIS — Z3A24 24 weeks gestation of pregnancy: Secondary | ICD-10-CM | POA: Diagnosis not present

## 2022-04-29 DIAGNOSIS — Z362 Encounter for other antenatal screening follow-up: Secondary | ICD-10-CM | POA: Diagnosis not present

## 2022-04-29 DIAGNOSIS — F319 Bipolar disorder, unspecified: Secondary | ICD-10-CM | POA: Diagnosis not present

## 2022-04-29 DIAGNOSIS — O99212 Obesity complicating pregnancy, second trimester: Secondary | ICD-10-CM | POA: Diagnosis not present

## 2022-04-29 DIAGNOSIS — O09899 Supervision of other high risk pregnancies, unspecified trimester: Secondary | ICD-10-CM | POA: Diagnosis not present

## 2022-05-07 ENCOUNTER — Encounter: Payer: Medicaid Other | Admitting: Family Medicine

## 2022-05-14 ENCOUNTER — Other Ambulatory Visit: Payer: Medicaid Other

## 2022-05-14 ENCOUNTER — Ambulatory Visit: Payer: Medicaid Other

## 2022-05-16 DIAGNOSIS — O26892 Other specified pregnancy related conditions, second trimester: Secondary | ICD-10-CM | POA: Diagnosis not present

## 2022-05-16 DIAGNOSIS — O4692 Antepartum hemorrhage, unspecified, second trimester: Secondary | ICD-10-CM | POA: Diagnosis not present

## 2022-05-16 DIAGNOSIS — Z1152 Encounter for screening for COVID-19: Secondary | ICD-10-CM | POA: Diagnosis not present

## 2022-05-16 DIAGNOSIS — R102 Pelvic and perineal pain: Secondary | ICD-10-CM | POA: Diagnosis not present

## 2022-05-16 DIAGNOSIS — R059 Cough, unspecified: Secondary | ICD-10-CM | POA: Diagnosis not present

## 2022-05-16 DIAGNOSIS — Z3A26 26 weeks gestation of pregnancy: Secondary | ICD-10-CM | POA: Diagnosis not present

## 2022-05-21 DIAGNOSIS — R8290 Unspecified abnormal findings in urine: Secondary | ICD-10-CM | POA: Diagnosis not present

## 2022-05-21 DIAGNOSIS — Z3A27 27 weeks gestation of pregnancy: Secondary | ICD-10-CM | POA: Diagnosis not present

## 2022-05-21 DIAGNOSIS — O210 Mild hyperemesis gravidarum: Secondary | ICD-10-CM | POA: Diagnosis not present

## 2022-05-21 DIAGNOSIS — O09899 Supervision of other high risk pregnancies, unspecified trimester: Secondary | ICD-10-CM | POA: Diagnosis not present

## 2022-05-21 DIAGNOSIS — Z23 Encounter for immunization: Secondary | ICD-10-CM | POA: Diagnosis not present

## 2022-05-22 DIAGNOSIS — Z419 Encounter for procedure for purposes other than remedying health state, unspecified: Secondary | ICD-10-CM | POA: Diagnosis not present

## 2022-05-28 DIAGNOSIS — O99810 Abnormal glucose complicating pregnancy: Secondary | ICD-10-CM | POA: Diagnosis not present

## 2022-06-05 ENCOUNTER — Other Ambulatory Visit: Payer: Self-pay | Admitting: *Deleted

## 2022-06-05 ENCOUNTER — Encounter: Payer: Self-pay | Admitting: *Deleted

## 2022-06-05 ENCOUNTER — Ambulatory Visit: Payer: Medicaid Other | Admitting: *Deleted

## 2022-06-05 ENCOUNTER — Ambulatory Visit: Payer: Medicaid Other | Attending: Certified Nurse Midwife

## 2022-06-05 VITALS — BP 112/59 | HR 97

## 2022-06-05 DIAGNOSIS — O99213 Obesity complicating pregnancy, third trimester: Secondary | ICD-10-CM | POA: Insufficient documentation

## 2022-06-05 DIAGNOSIS — Z3689 Encounter for other specified antenatal screening: Secondary | ICD-10-CM

## 2022-06-05 DIAGNOSIS — Z3A29 29 weeks gestation of pregnancy: Secondary | ICD-10-CM | POA: Insufficient documentation

## 2022-06-05 DIAGNOSIS — Z3480 Encounter for supervision of other normal pregnancy, unspecified trimester: Secondary | ICD-10-CM | POA: Diagnosis not present

## 2022-06-05 DIAGNOSIS — O0991 Supervision of high risk pregnancy, unspecified, first trimester: Secondary | ICD-10-CM | POA: Diagnosis not present

## 2022-06-05 DIAGNOSIS — Z363 Encounter for antenatal screening for malformations: Secondary | ICD-10-CM | POA: Diagnosis not present

## 2022-06-05 DIAGNOSIS — O99351 Diseases of the nervous system complicating pregnancy, first trimester: Secondary | ICD-10-CM | POA: Diagnosis not present

## 2022-06-05 DIAGNOSIS — O210 Mild hyperemesis gravidarum: Secondary | ICD-10-CM | POA: Diagnosis not present

## 2022-06-05 DIAGNOSIS — Z362 Encounter for other antenatal screening follow-up: Secondary | ICD-10-CM

## 2022-06-08 DIAGNOSIS — F319 Bipolar disorder, unspecified: Secondary | ICD-10-CM | POA: Diagnosis not present

## 2022-06-08 DIAGNOSIS — O219 Vomiting of pregnancy, unspecified: Secondary | ICD-10-CM | POA: Diagnosis not present

## 2022-06-08 DIAGNOSIS — Z6834 Body mass index (BMI) 34.0-34.9, adult: Secondary | ICD-10-CM | POA: Diagnosis not present

## 2022-06-08 DIAGNOSIS — E860 Dehydration: Secondary | ICD-10-CM | POA: Diagnosis not present

## 2022-06-08 DIAGNOSIS — O99340 Other mental disorders complicating pregnancy, unspecified trimester: Secondary | ICD-10-CM | POA: Diagnosis not present

## 2022-06-08 DIAGNOSIS — O26893 Other specified pregnancy related conditions, third trimester: Secondary | ICD-10-CM | POA: Diagnosis not present

## 2022-06-08 DIAGNOSIS — Z79899 Other long term (current) drug therapy: Secondary | ICD-10-CM | POA: Diagnosis not present

## 2022-06-08 DIAGNOSIS — Z3A30 30 weeks gestation of pregnancy: Secondary | ICD-10-CM | POA: Diagnosis not present

## 2022-06-08 DIAGNOSIS — R1084 Generalized abdominal pain: Secondary | ICD-10-CM | POA: Diagnosis not present

## 2022-06-08 DIAGNOSIS — Z1331 Encounter for screening for depression: Secondary | ICD-10-CM | POA: Diagnosis not present

## 2022-06-08 DIAGNOSIS — Z882 Allergy status to sulfonamides status: Secondary | ICD-10-CM | POA: Diagnosis not present

## 2022-06-08 DIAGNOSIS — Z8659 Personal history of other mental and behavioral disorders: Secondary | ICD-10-CM | POA: Diagnosis not present

## 2022-06-19 DIAGNOSIS — Z3A31 31 weeks gestation of pregnancy: Secondary | ICD-10-CM | POA: Diagnosis not present

## 2022-06-19 DIAGNOSIS — O99210 Obesity complicating pregnancy, unspecified trimester: Secondary | ICD-10-CM | POA: Diagnosis not present

## 2022-06-19 DIAGNOSIS — O09899 Supervision of other high risk pregnancies, unspecified trimester: Secondary | ICD-10-CM | POA: Diagnosis not present

## 2022-06-21 DIAGNOSIS — Z419 Encounter for procedure for purposes other than remedying health state, unspecified: Secondary | ICD-10-CM | POA: Diagnosis not present

## 2022-06-24 DIAGNOSIS — F319 Bipolar disorder, unspecified: Secondary | ICD-10-CM | POA: Diagnosis not present

## 2022-06-26 DIAGNOSIS — Z882 Allergy status to sulfonamides status: Secondary | ICD-10-CM | POA: Diagnosis not present

## 2022-06-26 DIAGNOSIS — Z20822 Contact with and (suspected) exposure to covid-19: Secondary | ICD-10-CM | POA: Diagnosis not present

## 2022-06-26 DIAGNOSIS — O98519 Other viral diseases complicating pregnancy, unspecified trimester: Secondary | ICD-10-CM | POA: Diagnosis not present

## 2022-06-26 DIAGNOSIS — Z3A32 32 weeks gestation of pregnancy: Secondary | ICD-10-CM | POA: Diagnosis not present

## 2022-06-26 DIAGNOSIS — Z79899 Other long term (current) drug therapy: Secondary | ICD-10-CM | POA: Diagnosis not present

## 2022-06-26 DIAGNOSIS — O98513 Other viral diseases complicating pregnancy, third trimester: Secondary | ICD-10-CM | POA: Diagnosis not present

## 2022-06-26 DIAGNOSIS — F319 Bipolar disorder, unspecified: Secondary | ICD-10-CM | POA: Diagnosis not present

## 2022-06-26 DIAGNOSIS — B338 Other specified viral diseases: Secondary | ICD-10-CM | POA: Diagnosis not present

## 2022-06-26 DIAGNOSIS — O99343 Other mental disorders complicating pregnancy, third trimester: Secondary | ICD-10-CM | POA: Diagnosis not present

## 2022-06-27 DIAGNOSIS — O98513 Other viral diseases complicating pregnancy, third trimester: Secondary | ICD-10-CM | POA: Diagnosis not present

## 2022-06-27 DIAGNOSIS — Z3A32 32 weeks gestation of pregnancy: Secondary | ICD-10-CM | POA: Diagnosis not present

## 2022-07-05 ENCOUNTER — Ambulatory Visit: Payer: Medicaid Other

## 2022-07-08 DIAGNOSIS — F319 Bipolar disorder, unspecified: Secondary | ICD-10-CM | POA: Diagnosis not present

## 2022-07-13 DIAGNOSIS — Z3A35 35 weeks gestation of pregnancy: Secondary | ICD-10-CM | POA: Diagnosis not present

## 2022-07-13 DIAGNOSIS — Z1152 Encounter for screening for COVID-19: Secondary | ICD-10-CM | POA: Diagnosis not present

## 2022-07-13 DIAGNOSIS — R051 Acute cough: Secondary | ICD-10-CM | POA: Diagnosis not present

## 2022-07-13 DIAGNOSIS — Z3A39 39 weeks gestation of pregnancy: Secondary | ICD-10-CM | POA: Diagnosis not present

## 2022-07-13 DIAGNOSIS — O26893 Other specified pregnancy related conditions, third trimester: Secondary | ICD-10-CM | POA: Diagnosis not present

## 2022-07-13 DIAGNOSIS — E860 Dehydration: Secondary | ICD-10-CM | POA: Diagnosis not present

## 2022-07-13 DIAGNOSIS — O219 Vomiting of pregnancy, unspecified: Secondary | ICD-10-CM | POA: Diagnosis not present

## 2022-07-13 DIAGNOSIS — R1110 Vomiting, unspecified: Secondary | ICD-10-CM | POA: Diagnosis not present

## 2022-07-18 DIAGNOSIS — O09893 Supervision of other high risk pregnancies, third trimester: Secondary | ICD-10-CM | POA: Diagnosis not present

## 2022-07-18 DIAGNOSIS — O99210 Obesity complicating pregnancy, unspecified trimester: Secondary | ICD-10-CM | POA: Diagnosis not present

## 2022-07-18 DIAGNOSIS — Z3A35 35 weeks gestation of pregnancy: Secondary | ICD-10-CM | POA: Diagnosis not present

## 2022-07-18 DIAGNOSIS — O09899 Supervision of other high risk pregnancies, unspecified trimester: Secondary | ICD-10-CM | POA: Diagnosis not present

## 2022-07-22 DIAGNOSIS — Z419 Encounter for procedure for purposes other than remedying health state, unspecified: Secondary | ICD-10-CM | POA: Diagnosis not present

## 2022-07-26 DIAGNOSIS — R52 Pain, unspecified: Secondary | ICD-10-CM | POA: Diagnosis not present

## 2022-07-26 DIAGNOSIS — D649 Anemia, unspecified: Secondary | ICD-10-CM | POA: Diagnosis not present

## 2022-07-26 DIAGNOSIS — Z3A37 37 weeks gestation of pregnancy: Secondary | ICD-10-CM | POA: Diagnosis not present

## 2022-07-26 DIAGNOSIS — O26893 Other specified pregnancy related conditions, third trimester: Secondary | ICD-10-CM | POA: Diagnosis not present

## 2022-07-26 DIAGNOSIS — O99324 Drug use complicating childbirth: Secondary | ICD-10-CM | POA: Diagnosis not present

## 2022-07-26 DIAGNOSIS — O9903 Anemia complicating the puerperium: Secondary | ICD-10-CM | POA: Diagnosis not present

## 2022-07-26 DIAGNOSIS — Z3483 Encounter for supervision of other normal pregnancy, third trimester: Secondary | ICD-10-CM | POA: Diagnosis not present

## 2022-07-26 DIAGNOSIS — F339 Major depressive disorder, recurrent, unspecified: Secondary | ICD-10-CM | POA: Diagnosis not present

## 2022-07-26 DIAGNOSIS — Z743 Need for continuous supervision: Secondary | ICD-10-CM | POA: Diagnosis not present

## 2022-07-26 DIAGNOSIS — I10 Essential (primary) hypertension: Secondary | ICD-10-CM | POA: Diagnosis not present

## 2022-07-26 DIAGNOSIS — R10817 Generalized abdominal tenderness: Secondary | ICD-10-CM | POA: Diagnosis not present

## 2022-07-26 DIAGNOSIS — O3503X1 Maternal care for (suspected) central nervous system malformation or damage in fetus, choroid plexus cysts, fetus 1: Secondary | ICD-10-CM | POA: Diagnosis not present

## 2022-07-26 DIAGNOSIS — F1190 Opioid use, unspecified, uncomplicated: Secondary | ICD-10-CM | POA: Diagnosis not present

## 2022-07-26 DIAGNOSIS — R569 Unspecified convulsions: Secondary | ICD-10-CM | POA: Diagnosis not present

## 2022-07-26 DIAGNOSIS — Z7401 Bed confinement status: Secondary | ICD-10-CM | POA: Diagnosis not present

## 2022-07-26 DIAGNOSIS — O9902 Anemia complicating childbirth: Secondary | ICD-10-CM | POA: Diagnosis not present

## 2022-07-26 DIAGNOSIS — O99344 Other mental disorders complicating childbirth: Secondary | ICD-10-CM | POA: Diagnosis not present

## 2022-07-27 DIAGNOSIS — Z2839 Other underimmunization status: Secondary | ICD-10-CM | POA: Diagnosis not present

## 2022-07-27 DIAGNOSIS — O99891 Other specified diseases and conditions complicating pregnancy: Secondary | ICD-10-CM | POA: Diagnosis not present

## 2022-07-27 DIAGNOSIS — O99013 Anemia complicating pregnancy, third trimester: Secondary | ICD-10-CM | POA: Diagnosis not present

## 2022-07-27 DIAGNOSIS — Z3A37 37 weeks gestation of pregnancy: Secondary | ICD-10-CM | POA: Diagnosis not present

## 2022-07-31 DIAGNOSIS — A530 Latent syphilis, unspecified as early or late: Secondary | ICD-10-CM | POA: Diagnosis not present

## 2022-08-01 DIAGNOSIS — Z3009 Encounter for other general counseling and advice on contraception: Secondary | ICD-10-CM | POA: Diagnosis not present

## 2022-08-01 DIAGNOSIS — Z309 Encounter for contraceptive management, unspecified: Secondary | ICD-10-CM | POA: Diagnosis not present

## 2022-08-05 ENCOUNTER — Ambulatory Visit: Payer: Medicaid Other

## 2022-08-12 DIAGNOSIS — F319 Bipolar disorder, unspecified: Secondary | ICD-10-CM | POA: Diagnosis not present

## 2022-08-22 DIAGNOSIS — Z419 Encounter for procedure for purposes other than remedying health state, unspecified: Secondary | ICD-10-CM | POA: Diagnosis not present

## 2022-08-26 DIAGNOSIS — F319 Bipolar disorder, unspecified: Secondary | ICD-10-CM | POA: Diagnosis not present

## 2022-08-29 DIAGNOSIS — Z6831 Body mass index (BMI) 31.0-31.9, adult: Secondary | ICD-10-CM | POA: Diagnosis not present

## 2022-08-29 DIAGNOSIS — F319 Bipolar disorder, unspecified: Secondary | ICD-10-CM | POA: Diagnosis not present

## 2022-08-29 DIAGNOSIS — E669 Obesity, unspecified: Secondary | ICD-10-CM | POA: Diagnosis not present

## 2022-08-29 DIAGNOSIS — Z79899 Other long term (current) drug therapy: Secondary | ICD-10-CM | POA: Diagnosis not present

## 2022-08-29 DIAGNOSIS — Z302 Encounter for sterilization: Secondary | ICD-10-CM | POA: Diagnosis not present

## 2022-08-31 DIAGNOSIS — Z6831 Body mass index (BMI) 31.0-31.9, adult: Secondary | ICD-10-CM | POA: Diagnosis not present

## 2022-08-31 DIAGNOSIS — F319 Bipolar disorder, unspecified: Secondary | ICD-10-CM | POA: Diagnosis not present

## 2022-09-09 DIAGNOSIS — F319 Bipolar disorder, unspecified: Secondary | ICD-10-CM | POA: Diagnosis not present

## 2022-09-11 DIAGNOSIS — Z113 Encounter for screening for infections with a predominantly sexual mode of transmission: Secondary | ICD-10-CM | POA: Diagnosis not present

## 2022-09-11 DIAGNOSIS — Z124 Encounter for screening for malignant neoplasm of cervix: Secondary | ICD-10-CM | POA: Diagnosis not present

## 2022-09-11 DIAGNOSIS — Z30017 Encounter for initial prescription of implantable subdermal contraceptive: Secondary | ICD-10-CM | POA: Diagnosis not present

## 2022-09-20 DIAGNOSIS — Z419 Encounter for procedure for purposes other than remedying health state, unspecified: Secondary | ICD-10-CM | POA: Diagnosis not present

## 2022-09-23 DIAGNOSIS — F319 Bipolar disorder, unspecified: Secondary | ICD-10-CM | POA: Diagnosis not present

## 2022-09-25 DIAGNOSIS — Z48816 Encounter for surgical aftercare following surgery on the genitourinary system: Secondary | ICD-10-CM | POA: Diagnosis not present

## 2022-09-25 DIAGNOSIS — Z9889 Other specified postprocedural states: Secondary | ICD-10-CM | POA: Diagnosis not present

## 2022-10-01 ENCOUNTER — Telehealth: Payer: Self-pay

## 2022-10-01 NOTE — Telephone Encounter (Signed)
..  Patient declines further follow up and engagement by the Managed Medicaid Team. Appropriate care team members and provider have been notified via electronic communication. The Managed Medicaid Team is available to follow up with the patient after provider conversation with the patient regarding recommendation for engagement and subsequent re-referral to the Managed Medicaid Team.    Jonaya Freshour Care Guide, High Risk Medicaid Managed Care Embedded Care Coordination [place]  [facility]   

## 2022-10-07 DIAGNOSIS — F319 Bipolar disorder, unspecified: Secondary | ICD-10-CM | POA: Diagnosis not present

## 2022-10-14 DIAGNOSIS — S99922A Unspecified injury of left foot, initial encounter: Secondary | ICD-10-CM | POA: Diagnosis not present

## 2022-10-14 DIAGNOSIS — R55 Syncope and collapse: Secondary | ICD-10-CM | POA: Diagnosis not present

## 2022-10-14 DIAGNOSIS — D649 Anemia, unspecified: Secondary | ICD-10-CM | POA: Diagnosis not present

## 2022-10-14 DIAGNOSIS — Z041 Encounter for examination and observation following transport accident: Secondary | ICD-10-CM | POA: Diagnosis not present

## 2022-10-14 DIAGNOSIS — F319 Bipolar disorder, unspecified: Secondary | ICD-10-CM | POA: Diagnosis not present

## 2022-10-14 DIAGNOSIS — M79672 Pain in left foot: Secondary | ICD-10-CM | POA: Diagnosis not present

## 2022-10-14 DIAGNOSIS — R519 Headache, unspecified: Secondary | ICD-10-CM | POA: Diagnosis not present

## 2022-10-14 DIAGNOSIS — M7989 Other specified soft tissue disorders: Secondary | ICD-10-CM | POA: Diagnosis not present

## 2022-10-14 DIAGNOSIS — M25572 Pain in left ankle and joints of left foot: Secondary | ICD-10-CM | POA: Diagnosis not present

## 2022-10-14 DIAGNOSIS — Z882 Allergy status to sulfonamides status: Secondary | ICD-10-CM | POA: Diagnosis not present

## 2022-10-16 ENCOUNTER — Telehealth: Payer: Self-pay | Admitting: *Deleted

## 2022-10-16 NOTE — Transitions of Care (Post Inpatient/ED Visit) (Signed)
   10/16/2022  Name: Leah [name] MRN: [id number] DOB: 20-May-1996  Today's TOC FU Call Status: Today's TOC FU Call Status:: Unsuccessul Call (1st Attempt) Unsuccessful Call (1st Attempt) Date: 10/16/22  Attempted to reach the patient regarding the most recent Inpatient/ED visit.  Follow Up Plan: Additional outreach attempts will be made to reach the patient to complete the Transitions of Care (Post Inpatient/ED visit) call.   Lurena Joiner RN, BSN [place] [facility] RN Care Coordinator [phone]

## 2022-10-17 DIAGNOSIS — Z8619 Personal history of other infectious and parasitic diseases: Secondary | ICD-10-CM | POA: Diagnosis not present

## 2022-10-17 DIAGNOSIS — L0100 Impetigo, unspecified: Secondary | ICD-10-CM | POA: Diagnosis not present

## 2022-10-17 DIAGNOSIS — Z113 Encounter for screening for infections with a predominantly sexual mode of transmission: Secondary | ICD-10-CM | POA: Diagnosis not present

## 2022-10-21 DIAGNOSIS — Z419 Encounter for procedure for purposes other than remedying health state, unspecified: Secondary | ICD-10-CM | POA: Diagnosis not present

## 2022-10-23 ENCOUNTER — Telehealth: Payer: Self-pay | Admitting: *Deleted

## 2022-10-23 NOTE — Transitions of Care (Post Inpatient/ED Visit) (Signed)
   10/23/2022  Name: Leah [name] MRN: [id number] DOB: 07-05-1996  Today's TOC FU Call Status: Today's TOC FU Call Status:: Unsuccessful Call (2nd Attempt) Unsuccessful Call (2nd Attempt) Date: 10/23/22  Attempted to reach the patient regarding the most recent Inpatient/ED visit.  Patient's telephone number is not in service.  Follow Up Plan: No further outreach attempts will be made at this time. We have been unable to contact the patient.  Lurena Joiner RN, BSN [place] [facility] RN Care Coordinator [phone]

## 2022-11-19 DIAGNOSIS — Z113 Encounter for screening for infections with a predominantly sexual mode of transmission: Secondary | ICD-10-CM | POA: Diagnosis not present

## 2022-11-19 DIAGNOSIS — Z8619 Personal history of other infectious and parasitic diseases: Secondary | ICD-10-CM | POA: Diagnosis not present

## 2022-11-20 DIAGNOSIS — Z419 Encounter for procedure for purposes other than remedying health state, unspecified: Secondary | ICD-10-CM | POA: Diagnosis not present

## 2022-12-21 DIAGNOSIS — Z419 Encounter for procedure for purposes other than remedying health state, unspecified: Secondary | ICD-10-CM | POA: Diagnosis not present

## 2022-12-25 DIAGNOSIS — F319 Bipolar disorder, unspecified: Secondary | ICD-10-CM | POA: Diagnosis not present

## 2023-01-03 DIAGNOSIS — R197 Diarrhea, unspecified: Secondary | ICD-10-CM | POA: Diagnosis not present

## 2023-01-03 DIAGNOSIS — R7401 Elevation of levels of liver transaminase levels: Secondary | ICD-10-CM | POA: Diagnosis not present

## 2023-01-03 DIAGNOSIS — R1012 Left upper quadrant pain: Secondary | ICD-10-CM | POA: Diagnosis not present

## 2023-01-09 DIAGNOSIS — R1011 Right upper quadrant pain: Secondary | ICD-10-CM | POA: Diagnosis not present

## 2023-01-19 IMAGING — DX DG CHEST 1V PORT
1 series · 1 of 1 positions shown · non-contrast
Comparison: 02/02/2021

CLINICAL DATA: Chest pain

EXAM:
PORTABLE CHEST 1 VIEW

[chest ap]
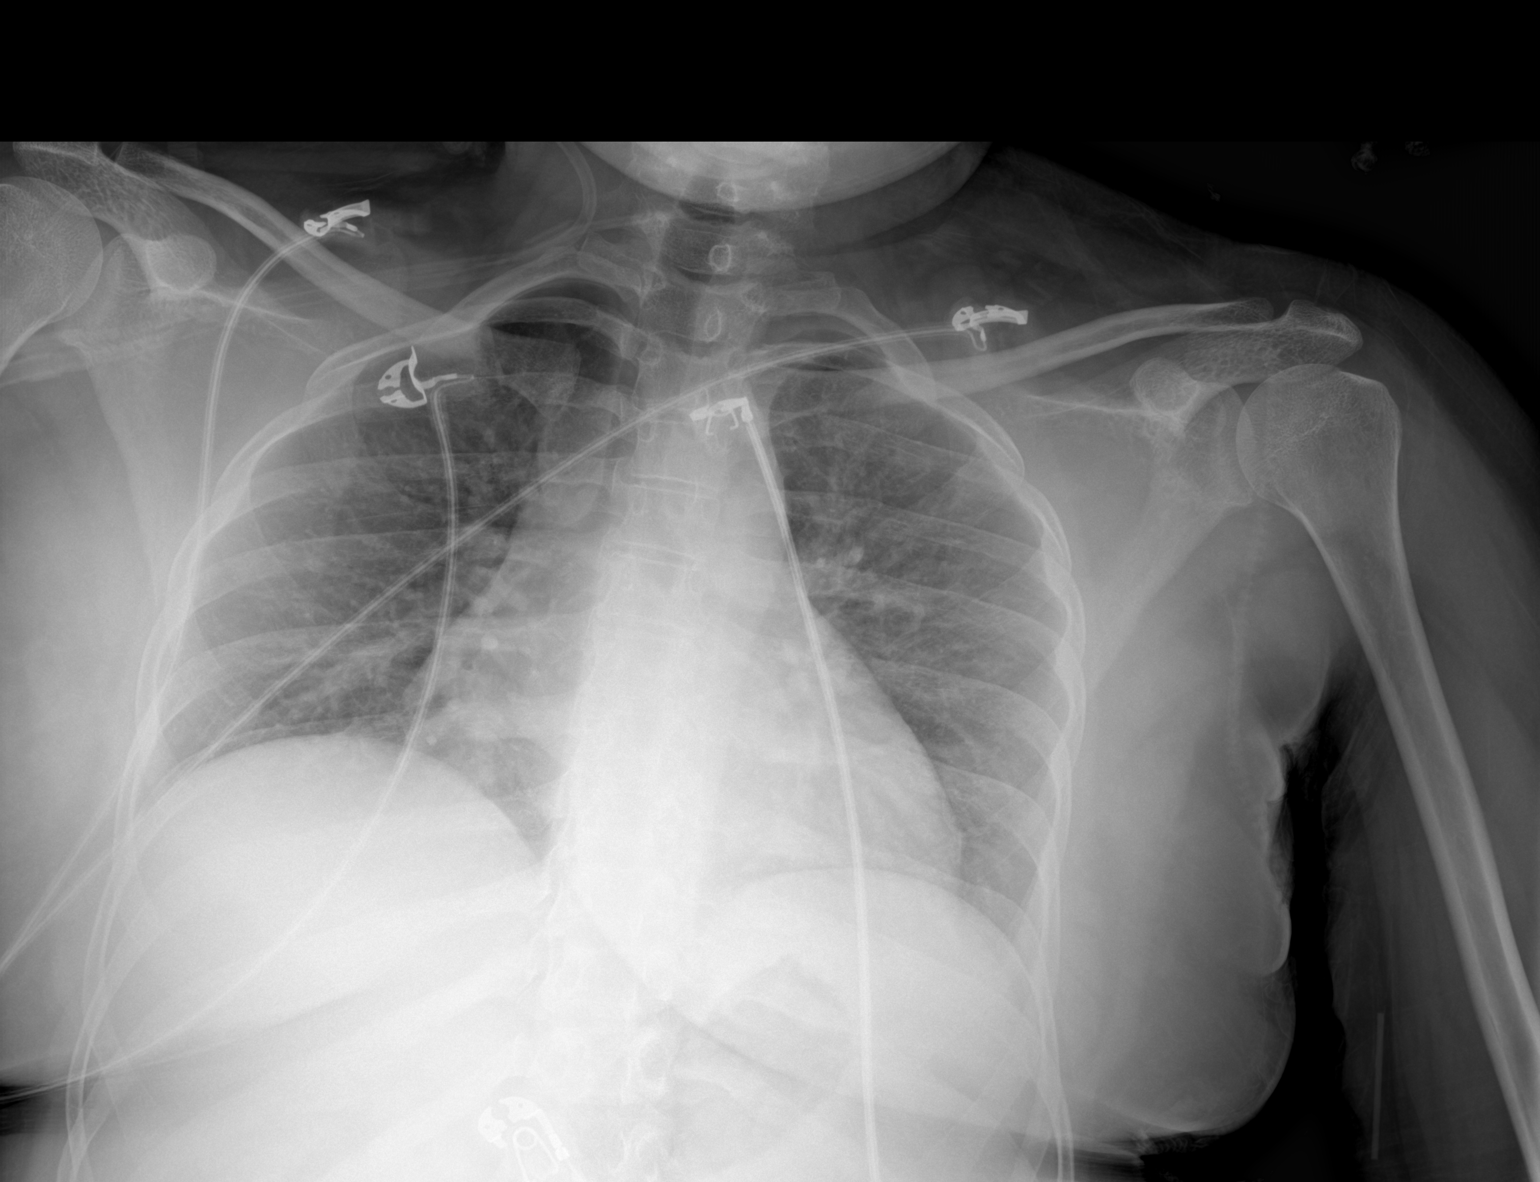

[1 of 1 positions shown; findings below may reference images not displayed]

FINDINGS: Lungs are clear.  No pleural effusion or pneumothorax.

The heart is normal in size.
IMPRESSION: No evidence of acute cardiopulmonary disease.

## 2023-01-20 DIAGNOSIS — Z419 Encounter for procedure for purposes other than remedying health state, unspecified: Secondary | ICD-10-CM | POA: Diagnosis not present

## 2023-01-30 DIAGNOSIS — Z0000 Encounter for general adult medical examination without abnormal findings: Secondary | ICD-10-CM | POA: Diagnosis not present

## 2023-01-30 DIAGNOSIS — Z113 Encounter for screening for infections with a predominantly sexual mode of transmission: Secondary | ICD-10-CM | POA: Diagnosis not present

## 2023-02-03 DIAGNOSIS — A64 Unspecified sexually transmitted disease: Secondary | ICD-10-CM | POA: Diagnosis not present

## 2023-02-03 DIAGNOSIS — A549 Gonococcal infection, unspecified: Secondary | ICD-10-CM | POA: Diagnosis not present

## 2023-02-11 DIAGNOSIS — Z20822 Contact with and (suspected) exposure to covid-19: Secondary | ICD-10-CM | POA: Diagnosis not present

## 2023-02-11 DIAGNOSIS — K529 Noninfective gastroenteritis and colitis, unspecified: Secondary | ICD-10-CM | POA: Diagnosis not present

## 2023-11-27 ENCOUNTER — Other Ambulatory Visit (HOSPITAL_COMMUNITY)
Admission: RE | Admit: 2023-11-27 | Discharge: 2023-11-27 | Disposition: A | Discharge status: Home / Self Care | Source: Ambulatory Visit | Attending: Family Medicine | Admitting: Family Medicine

## 2023-11-27 ENCOUNTER — Ambulatory Visit

## 2023-11-27 ENCOUNTER — Other Ambulatory Visit: Payer: Self-pay

## 2023-11-27 VITALS — BP 115/71 | HR 89 | Ht 60.0 in | Wt 166.1 lb

## 2023-11-27 DIAGNOSIS — Z202 Contact with and (suspected) exposure to infections with a predominantly sexual mode of transmission: Secondary | ICD-10-CM

## 2023-11-27 NOTE — Progress Notes (Signed)
 Pt presents today for STD testing due to new partner. Denies any symptoms. RN educated pt on self swab and pt voiced understanding. Pt informed that result would be available in MyChart. Pt stated no other questions or concerns for today.   Arnell Lange, RN

## 2023-11-28 LAB — CERVICOVAGINAL ANCILLARY ONLY
Bacterial Vaginitis (gardnerella): NEGATIVE
Candida Glabrata: NEGATIVE
Candida Vaginitis: NEGATIVE
Chlamydia: NEGATIVE
Comment: NEGATIVE
Comment: NEGATIVE
Comment: NEGATIVE
Comment: NEGATIVE
Comment: NEGATIVE
Comment: NORMAL
Neisseria Gonorrhea: NEGATIVE
Trichomonas: NEGATIVE

## 2023-12-01 ENCOUNTER — Encounter: Payer: Self-pay | Admitting: Family Medicine

## 2024-03-31 ENCOUNTER — Ambulatory Visit

## 2024-04-06 ENCOUNTER — Ambulatory Visit

## 2024-05-13 ENCOUNTER — Other Ambulatory Visit: Payer: Self-pay

## 2024-05-13 ENCOUNTER — Ambulatory Visit (INDEPENDENT_AMBULATORY_CARE_PROVIDER_SITE_OTHER): Admitting: *Deleted

## 2024-05-13 ENCOUNTER — Other Ambulatory Visit (HOSPITAL_COMMUNITY)
Admission: RE | Admit: 2024-05-13 | Discharge: 2024-05-13 | Disposition: A | Discharge status: Home / Self Care | Source: Ambulatory Visit | Attending: Family Medicine | Admitting: Family Medicine

## 2024-05-13 VITALS — BP 125/80 | HR 75 | Ht 60.0 in | Wt 183.0 lb

## 2024-05-13 DIAGNOSIS — N898 Other specified noninflammatory disorders of vagina: Secondary | ICD-10-CM | POA: Diagnosis present

## 2024-05-13 DIAGNOSIS — Z202 Contact with and (suspected) exposure to infections with a predominantly sexual mode of transmission: Secondary | ICD-10-CM | POA: Insufficient documentation

## 2024-05-13 LAB — CERVICOVAGINAL ANCILLARY ONLY
Bacterial Vaginitis (gardnerella): NEGATIVE
Candida Glabrata: NEGATIVE
Candida Vaginitis: NEGATIVE
Chlamydia: NEGATIVE
Comment: NEGATIVE
Comment: NEGATIVE
Comment: NEGATIVE
Comment: NEGATIVE
Comment: NEGATIVE
Comment: NORMAL
Neisseria Gonorrhea: NEGATIVE
Trichomonas: NEGATIVE

## 2024-05-13 NOTE — Progress Notes (Signed)
 She is here for a self swab due to having thick white/little yellow vaginal discharge with odor; also reports new partner. Would like to full wet prep . Explained if any positive results she will be contacted with treatment. Also informed her per chart review it appears it is time for her annual exam and instructed her to schedule at checkout. She voices understanding. Rock Skip PEAK

## 2024-05-28 ENCOUNTER — Emergency Department (HOSPITAL_COMMUNITY)
Admission: EM | Admit: 2024-05-28 | Discharge: 2024-05-29 | Disposition: A | Discharge status: Home / Self Care | Attending: Emergency Medicine | Admitting: Emergency Medicine

## 2024-05-28 ENCOUNTER — Emergency Department (HOSPITAL_COMMUNITY)

## 2024-05-28 ENCOUNTER — Encounter (HOSPITAL_COMMUNITY): Payer: Self-pay

## 2024-05-28 ENCOUNTER — Other Ambulatory Visit: Payer: Self-pay

## 2024-05-28 DIAGNOSIS — R8289 Other abnormal findings on cytological and histological examination of urine: Secondary | ICD-10-CM | POA: Diagnosis not present

## 2024-05-28 DIAGNOSIS — R112 Nausea with vomiting, unspecified: Secondary | ICD-10-CM | POA: Diagnosis not present

## 2024-05-28 DIAGNOSIS — R1031 Right lower quadrant pain: Secondary | ICD-10-CM | POA: Diagnosis not present

## 2024-05-28 DIAGNOSIS — R1032 Left lower quadrant pain: Secondary | ICD-10-CM | POA: Insufficient documentation

## 2024-05-28 DIAGNOSIS — R109 Unspecified abdominal pain: Secondary | ICD-10-CM | POA: Diagnosis present

## 2024-05-28 LAB — URINALYSIS, ROUTINE W REFLEX MICROSCOPIC
Bilirubin Urine: NEGATIVE
Glucose, UA: NEGATIVE mg/dL
Hgb urine dipstick: NEGATIVE
Ketones, ur: NEGATIVE mg/dL
Nitrite: NEGATIVE
Protein, ur: 30 mg/dL — AB
Specific Gravity, Urine: 1.029 (ref 1.005–1.030)
pH: 6 (ref 5.0–8.0)

## 2024-05-28 LAB — CBC
HCT: 40.5 % (ref 36.0–46.0)
Hemoglobin: 13.3 g/dL (ref 12.0–15.0)
MCH: 29.8 pg (ref 26.0–34.0)
MCHC: 32.8 g/dL (ref 30.0–36.0)
MCV: 90.8 fL (ref 80.0–100.0)
Platelets: 326 K/uL (ref 150–400)
RBC: 4.46 MIL/uL (ref 3.87–5.11)
RDW: 12.9 % (ref 11.5–15.5)
WBC: 8.3 K/uL (ref 4.0–10.5)
nRBC: 0 % (ref 0.0–0.2)

## 2024-05-28 LAB — COMPREHENSIVE METABOLIC PANEL WITH GFR
ALT: 17 U/L (ref 0–44)
AST: 20 U/L (ref 15–41)
Albumin: 4.2 g/dL (ref 3.5–5.0)
Alkaline Phosphatase: 112 U/L (ref 38–126)
Anion gap: 10 (ref 5–15)
BUN: 10 mg/dL (ref 6–20)
CO2: 23 mmol/L (ref 22–32)
Calcium: 9.6 mg/dL (ref 8.9–10.3)
Chloride: 108 mmol/L (ref 98–111)
Creatinine, Ser: 0.72 mg/dL (ref 0.44–1.00)
GFR, Estimated: >60 mL/min (ref >60)
Glucose, Bld: 93 mg/dL (ref 70–99)
Potassium: 4.1 mmol/L (ref 3.5–5.1)
Sodium: 141 mmol/L (ref 135–145)
Total Bilirubin: 0.6 mg/dL (ref 0.0–1.2)
Total Protein: 8.1 g/dL (ref 6.5–8.1)

## 2024-05-28 LAB — HCG, SERUM, QUALITATIVE: Preg, Serum: NEGATIVE

## 2024-05-28 LAB — LIPASE, BLOOD: Lipase: 27 U/L (ref 11–51)

## 2024-05-28 MED ORDER — SODIUM CHLORIDE 0.9 % IV BOLUS
500.0000 mL | Freq: Once | INTRAVENOUS | Status: AC
  Administered 2024-05-29: 500 mL via INTRAVENOUS

## 2024-05-28 MED ORDER — ONDANSETRON HCL 4 MG/2ML IJ SOLN
4.0000 mg | Freq: Once | INTRAMUSCULAR | Status: AC
  Administered 2024-05-28: 4 mg via INTRAVENOUS
  Filled 2024-05-28: qty 2

## 2024-05-28 MED ORDER — MORPHINE SULFATE (PF) 4 MG/ML IV SOLN
4.0000 mg | Freq: Once | INTRAVENOUS | Status: AC
  Administered 2024-05-29: 4 mg via INTRAVENOUS
  Filled 2024-05-28: qty 1

## 2024-05-28 MED ORDER — IOHEXOL 300 MG/ML  SOLN
100.0000 mL | Freq: Once | INTRAMUSCULAR | Status: AC | PRN
  Administered 2024-05-28: 100 mL via INTRAVENOUS

## 2024-05-28 MED ORDER — ONDANSETRON HCL 4 MG/2ML IJ SOLN
4.0000 mg | Freq: Once | INTRAMUSCULAR | Status: AC
  Administered 2024-05-29: 4 mg via INTRAVENOUS
  Filled 2024-05-28: qty 2

## 2024-05-28 MED ORDER — MORPHINE SULFATE (PF) 4 MG/ML IV SOLN
4.0000 mg | Freq: Once | INTRAVENOUS | Status: AC
  Administered 2024-05-28: 4 mg via INTRAVENOUS
  Filled 2024-05-28: qty 1

## 2024-05-28 MED ORDER — PANTOPRAZOLE SODIUM 40 MG IV SOLR
40.0000 mg | Freq: Once | INTRAVENOUS | Status: AC
  Administered 2024-05-29: 40 mg via INTRAVENOUS
  Filled 2024-05-28: qty 10

## 2024-05-28 NOTE — ED Provider Notes (Signed)
 [place] EMERGENCY DEPARTMENT AT [facility] Provider Note   CSN: [id number] Arrival date & time: 05/28/24  1853     Patient presents with: Abdominal Pain   Leah [name] is a 28 y.o. female with PMHx anemia, seizure, GERD, gall stones who presents to ED concerned for abdominal pain x5 days. Abdominal pain starts in thoracic area and radiates down towards pelvic area. Patient also with nausea and vomiting x5 days and also endorses an episode of diarrhea last Monday which has since resolved. Patient stating that she noticed a couple specks of blood in her vomit earlier today. Denies hematochezia. Denies dysuria, hematuria. Denies vaginal discharge or concern for STD - patient stating that she was actually tested for STD recently which was negative. Denies suspicious food intake or sick contact. Denies new medications.     Abdominal Pain      Prior to Admission medications   Medication Sig Start Date End Date Taking? Authorizing Provider  etonogestrel  (NEXPLANON ) 68 MG IMPL implant Inject 1 each into the skin once. 09/11/22   [provider]  OLANZapine  (ZYPREXA ) 5 MG tablet Take 5 mg by mouth at bedtime.    [provider]    Allergies: Bactrim  [sulfamethoxazole -trimethoprim ]    Review of Systems  Gastrointestinal:  Positive for abdominal pain.    Updated Vital Signs BP 114/76   Pulse 66   Temp 98 F (36.7 C)   Resp 16   Ht 5' (1.524 m)   Wt 81.6 kg   LMP 10/20/2021   SpO2 100%   BMI 35.15 kg/m   Physical Exam Vitals and nursing note reviewed.  Constitutional:      General: She is not in acute distress.    Appearance: She is not ill-appearing or toxic-appearing.  HENT:     Head: Normocephalic and atraumatic.     Mouth/Throat:     Mouth: Mucous membranes are moist.     Pharynx: No oropharyngeal exudate or posterior oropharyngeal erythema.  Eyes:     General: No scleral icterus.       Right eye: No discharge.        Left eye: No  discharge.     Conjunctiva/sclera: Conjunctivae normal.  Cardiovascular:     Rate and Rhythm: Normal rate and regular rhythm.     Pulses: Normal pulses.     Heart sounds: Normal heart sounds. No murmur heard. Pulmonary:     Effort: Pulmonary effort is normal. No respiratory distress.     Breath sounds: Normal breath sounds. No wheezing, rhonchi or rales.  Abdominal:     General: Abdomen is flat. Bowel sounds are normal. There is no distension.     Palpations: Abdomen is soft. There is no mass.     Tenderness: There is abdominal tenderness in the right lower quadrant, suprapubic area and left lower quadrant.  Musculoskeletal:     Right lower leg: No edema.     Left lower leg: No edema.  Skin:    General: Skin is warm and dry.     Findings: No rash.  Neurological:     General: No focal deficit present.     Mental Status: She is alert and oriented to person, place, and time. Mental status is at baseline.  Psychiatric:        Mood and Affect: Mood normal.        Behavior: Behavior normal.     (all labs ordered are listed, but only abnormal results are displayed) Labs  Reviewed  URINALYSIS, ROUTINE W REFLEX MICROSCOPIC - Abnormal; Notable for the following components:      Result Value   APPearance HAZY (*)    Protein, ur 30 (*)    Leukocytes,Ua TRACE (*)    Bacteria, UA RARE (*)    All other components within normal limits  LIPASE, BLOOD  COMPREHENSIVE METABOLIC PANEL WITH GFR  CBC  HCG, SERUM, QUALITATIVE    EKG: None  Radiology: US  PELVIC COMPLETE W TRANSVAGINAL AND TORSION R/O Result Date: 05/28/2024 EXAM: US  Pelvis, Complete Transvaginal and Transabdominal without Doppler TECHNIQUE: Transabdominal and transvaginal pelvic duplex ultrasound using B-mode/gray scaled imaging without Doppler spectral analysis and color flow was obtained. COMPARISON: None provided CLINICAL HISTORY: Pelvic pain. FINDINGS: UTERUS: The uterus measures 6.9 x 3.4 x 4.8 cm. The volume equals 59 ml.  Uterus demonstrates normal myometrial echotexture. ENDOMETRIAL STRIPE: The endometrium measures 1.9 mm. Endometrial stripe is within normal limits. RIGHT OVARY: Right ovary measures 2.6 x 1.8 x 1.7 cm. The volume is 4 ml. Normal arterial and venous flow is noted within the right ovary. LEFT OVARY: Left ovary measures 2.7 x 2.1 x 1.6 cm. The volume is 4.8 ml. Normal arterial and venous flow is noted within the left ovary. FREE FLUID: No free fluid. IMPRESSION: 1. No acute pelvic abnormality identified. Electronically signed by: Oneil Devonshire MD 05/28/2024 10:12 PM EST RP Workstation: [id number]     Procedures   Medications Ordered in the ED  iohexol  (OMNIPAQUE ) 300 MG/ML solution 100 mL (has no administration in time range)  sodium chloride  0.9 % bolus 500 mL (has no administration in time range)  morphine  (PF) 4 MG/ML injection 4 mg (has no administration in time range)  ondansetron  (ZOFRAN ) injection 4 mg (has no administration in time range)  pantoprazole  (PROTONIX ) injection 40 mg (has no administration in time range)  morphine  (PF) 4 MG/ML injection 4 mg (4 mg Intravenous Given 05/28/24 2201)  ondansetron  (ZOFRAN ) injection 4 mg (4 mg Intravenous Given 05/28/24 2201)                                    Medical Decision Making Amount and/or Complexity of Data Reviewed Labs: ordered. Radiology: ordered.  Risk Prescription drug management.    This patient presents to the ED for concern of abdominal pain, this involves an extensive number of treatment options, and is a complaint that carries with it a high risk of complications and morbidity.  The differential diagnosis includes gastroenteritis, colitis, small bowel obstruction, appendicitis, cholecystitis, pancreatitis, nephrolithiasis, UTI, pyleonephritis, ruptured ectopic pregnancy, PID, ovarian torsion.   Co morbidities that complicate the patient evaluation  anemia, seizure, GERD, gall stones   Additional history obtained:  Dr.  Vannie PCP/OBGYN   Problem List / ED Course / Critical interventions / Medication management  Patient presented for abdominal pain.  Pain starts in posterior/thoracic area and radiates along BL sides into her pelvic area. Also with nausea and vomiting. Endorses a little bit of blood in vomit earlier today. Physical exam with lower abdominal tenderness to palpation.  Rest of physical exam reassuring.  Patient afebrile with stable vitals. I Ordered, and personally interpreted labs.  hCG negative.  CBC without leukocytosis or anemia.  CMP reassuring.  Lipase within normal levels.  UA does have trace leukocytes and rare bacteria; however, patient denies any UTI symptoms. I ordered imaging studies including CT Abd/Pelvis and pelvic US . Pelvic US  without acute  concern. CT abd/pelv pending. I have reviewed the patients home medicines and have made adjustments as needed   Social Determinants of Health:  none  12AM Care of Adreona M Hoffman transferred to [employer] at the end of my shift as the patient will require reassessment once labs/imaging have resulted. Patient presentation, ED course, and plan of care discussed with review of all pertinent labs and imaging. Please see his/her note for further details regarding further ED course and disposition. Plan at time of handoff is reassess patient after CT scan and PO challenge. This may be altered or completely changed at the discretion of the oncoming team pending results of further workup.      Final diagnoses:  Nausea and vomiting, unspecified vomiting type    ED Discharge Orders     None          Hoy Nidia FALCON, [place] 05/28/24 2345    Cottie Donnice PARAS, MD 06/01/24 1325

## 2024-05-28 NOTE — ED Triage Notes (Addendum)
 Patient reports bilateral abdominal and pelvic pain. States the pain starts in her back and wraps around both sides. Today she said she had specs of blood in her vomit. Reports some diarrhea, no constipation. Denies issues with voiding. Reports being ale to eat and drink.

## 2024-05-29 MED ORDER — ONDANSETRON 4 MG PO TBDP: 4.0000 mg | ORAL_TABLET | Freq: Three times a day (TID) | ORAL | 0 refills | Status: DC | PRN

## 2024-05-29 MED ORDER — MORPHINE SULFATE (PF) 4 MG/ML IV SOLN
4.0000 mg | Freq: Once | INTRAVENOUS | Status: AC
  Administered 2024-05-29: 4 mg via INTRAVENOUS
  Filled 2024-05-29: qty 1

## 2024-05-29 NOTE — ED Provider Notes (Signed)
  Physical Exam  BP 120/74 (BP Location: Right Arm)   Pulse 65   Temp 98.6 F (37 C)   Resp 16   Ht 5' (1.524 m)   Wt 81.6 kg   LMP 10/20/2021   SpO2 99%   BMI 35.15 kg/m   Physical Exam  Procedures  Procedures  ED Course / MDM    Medical Decision Making Amount and/or Complexity of Data Reviewed Labs: ordered. Radiology: ordered.  Risk Prescription drug management.   Patient care assumed from [facility], PA-C at shift handoff.  Patient is a 28 year old female who presented complaining of 5 days of abdominal pain which was in the lower abdominal region.  She also endorsed 5 days of nausea and vomiting and 1 episode of diarrhea.  At time of shift handoff lab work been reassuring and CT abdomen pelvis has been ordered.  Initial CT was not completed due to apparent infiltration of IV so subsequent attempt was made.  Plan to discharge home if CT is negative with plans for nausea medication and primary care follow-up  CT results with no acute findings.  Plan to prescribe Zofran .  Patient requesting follow-up appointment with gastroenterology.  Follow-up information provided.  Patient is tolerating oral intake, eating a sandwich without difficulty.  Patient stable for discharge home.  Return precautions provided.       Logan Ubaldo KATHEE DEVONNA 05/29/24 ROGENE Midge Golas, MD 05/29/24 [phone]

## 2024-05-29 NOTE — Discharge Instructions (Addendum)
 Your workup this evening was reassuring.  I have prescribed Zofran  for your nausea.  I provided contact information for gastroenterology as requested.  Return to the emergency department if you develop any life-threatening symptoms.  Please schedule afollow-up appointment with your primary care provider.

## 2024-06-09 ENCOUNTER — Emergency Department (HOSPITAL_COMMUNITY)
Admission: EM | Admit: 2024-06-09 | Discharge: 2024-06-09 | Disposition: A | Discharge status: Home / Self Care | Attending: Emergency Medicine | Admitting: Emergency Medicine

## 2024-06-09 ENCOUNTER — Emergency Department (HOSPITAL_COMMUNITY)

## 2024-06-09 ENCOUNTER — Other Ambulatory Visit: Payer: Self-pay

## 2024-06-09 ENCOUNTER — Encounter (HOSPITAL_COMMUNITY): Payer: Self-pay

## 2024-06-09 DIAGNOSIS — R112 Nausea with vomiting, unspecified: Secondary | ICD-10-CM | POA: Diagnosis present

## 2024-06-09 LAB — COMPREHENSIVE METABOLIC PANEL WITH GFR
ALT: 55 U/L — ABNORMAL HIGH (ref 0–44)
AST: 33 U/L (ref 15–41)
Albumin: 4.4 g/dL (ref 3.5–5.0)
Alkaline Phosphatase: 130 U/L — ABNORMAL HIGH (ref 38–126)
Anion gap: 13 (ref 5–15)
BUN: 13 mg/dL (ref 6–20)
CO2: 23 mmol/L (ref 22–32)
Calcium: 10 mg/dL (ref 8.9–10.3)
Chloride: 103 mmol/L (ref 98–111)
Creatinine, Ser: 0.74 mg/dL (ref 0.44–1.00)
GFR, Estimated: >60 mL/min (ref >60)
Glucose, Bld: 81 mg/dL (ref 70–99)
Potassium: 3.7 mmol/L (ref 3.5–5.1)
Sodium: 140 mmol/L (ref 135–145)
Total Bilirubin: 0.6 mg/dL (ref 0.0–1.2)
Total Protein: 8.3 g/dL — ABNORMAL HIGH (ref 6.5–8.1)

## 2024-06-09 LAB — URINALYSIS, ROUTINE W REFLEX MICROSCOPIC
Bacteria, UA: NONE SEEN
Bilirubin Urine: NEGATIVE
Glucose, UA: NEGATIVE mg/dL
Hgb urine dipstick: NEGATIVE
Ketones, ur: 20 mg/dL — AB
Leukocytes,Ua: NEGATIVE
Nitrite: NEGATIVE
Protein, ur: 30 mg/dL — AB
Specific Gravity, Urine: 1.035 — ABNORMAL HIGH (ref 1.005–1.030)
pH: 5 (ref 5.0–8.0)

## 2024-06-09 LAB — CBC WITH DIFFERENTIAL/PLATELET
Abs Immature Granulocytes: 0.03 K/uL (ref 0.00–0.07)
Basophils Absolute: 0 K/uL (ref 0.0–0.1)
Basophils Relative: 0 %
Eosinophils Absolute: 0.1 K/uL (ref 0.0–0.5)
Eosinophils Relative: 1 %
HCT: 38.3 % (ref 36.0–46.0)
Hemoglobin: 12.4 g/dL (ref 12.0–15.0)
Immature Granulocytes: 0 %
Lymphocytes Relative: 27 %
Lymphs Abs: 2.9 K/uL (ref 0.7–4.0)
MCH: 29.5 pg (ref 26.0–34.0)
MCHC: 32.4 g/dL (ref 30.0–36.0)
MCV: 91.2 fL (ref 80.0–100.0)
Monocytes Absolute: 0.8 K/uL (ref 0.1–1.0)
Monocytes Relative: 8 %
Neutro Abs: 6.8 K/uL (ref 1.7–7.7)
Neutrophils Relative %: 64 %
Platelets: 346 K/uL (ref 150–400)
RBC: 4.2 MIL/uL (ref 3.87–5.11)
RDW: 13.1 % (ref 11.5–15.5)
WBC: 10.8 K/uL — ABNORMAL HIGH (ref 4.0–10.5)
nRBC: 0 % (ref 0.0–0.2)

## 2024-06-09 LAB — LIPASE, BLOOD: Lipase: 20 U/L (ref 11–51)

## 2024-06-09 MED ORDER — METOCLOPRAMIDE HCL 10 MG PO TABS
10.0000 mg | ORAL_TABLET | Freq: Four times a day (QID) | ORAL | 0 refills | Status: AC
Start: 2024-06-09 — End: ?

## 2024-06-09 MED ORDER — SUCRALFATE 1 G PO TABS: 1.0000 g | ORAL_TABLET | Freq: Three times a day (TID) | ORAL | 0 refills | Status: AC

## 2024-06-09 MED ORDER — OMEPRAZOLE 20 MG PO CPDR: 20.0000 mg | DELAYED_RELEASE_CAPSULE | Freq: Every day | ORAL | 0 refills | Status: AC

## 2024-06-09 MED ORDER — LACTATED RINGERS IV BOLUS
1000.0000 mL | Freq: Once | INTRAVENOUS | Status: AC
  Administered 2024-06-09: 1000 mL via INTRAVENOUS

## 2024-06-09 MED ORDER — DROPERIDOL 2.5 MG/ML IJ SOLN
1.2500 mg | Freq: Once | INTRAMUSCULAR | Status: AC
  Administered 2024-06-09: 1.25 mg via INTRAVENOUS
  Filled 2024-06-09: qty 2

## 2024-06-09 MED ORDER — METOCLOPRAMIDE HCL 5 MG/ML IJ SOLN
10.0000 mg | Freq: Once | INTRAMUSCULAR | Status: AC
  Administered 2024-06-09: 10 mg via INTRAVENOUS
  Filled 2024-06-09: qty 2

## 2024-06-09 MED ORDER — PANTOPRAZOLE SODIUM 40 MG IV SOLR
40.0000 mg | Freq: Once | INTRAVENOUS | Status: AC
  Administered 2024-06-09: 40 mg via INTRAVENOUS
  Filled 2024-06-09: qty 10

## 2024-06-09 NOTE — ED Provider Notes (Signed)
 [place] EMERGENCY DEPARTMENT AT [facility] Provider Note  CSN: [id number] Arrival date & time: 06/09/24 0825  Chief Complaint(s) Abdominal Pain and Emesis  HPI Leah [name] is a 28 y.o. female who is here today for nausea and vomiting for 2 weeks.  Patient reports that she was seen here on the seventh for the same.  States she has had issues with this for some time, has previously seen [facility], has had an endoscopy which she tells me was normal.  She does not currently take any medications for nausea.  Patient with a Nexplanon  implant, states she has not had a menstrual cycle since 2023.   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 Authorizing Provider  metoCLOPramide  (REGLAN ) 10 MG tablet Take 1 tablet (10 mg total) by mouth every 6 (six) hours. 06/09/24  Yes Mannie Pac T, DO  omeprazole  (PRILOSEC) 20 MG capsule Take 1 capsule (20 mg total) by mouth daily. 06/09/24 07/09/24 Yes Mannie Pac T, DO  sucralfate  (CARAFATE ) 1 g tablet Take 1 tablet (1 g total) by mouth 4 (four) times daily -  with meals and at bedtime. 06/09/24  Yes Mannie Pac T, DO  etonogestrel  (NEXPLANON ) 68 MG IMPL implant Inject 1 each into the skin once. 09/11/22   [provider]  OLANZapine  (ZYPREXA ) 5 MG tablet Take 5 mg by mouth at bedtime.    [provider]  ondansetron  (ZOFRAN -ODT) 4 MG disintegrating tablet Take 1 tablet (4 mg total) by mouth every  8 (eight) hours as needed for nausea or vomiting. 05/29/24   Logan Ubaldo KATHEE DEVONNA                                                                                                                                    Past Surgical History Past Surgical History:  Procedure Laterality Date   CHOLECYSTECTOMY N/A 09/28/2014   Procedure: LAPAROSCOPIC CHOLECYSTECTOMY;  Surgeon: Lynda Leos, MD;  Location: [facility] OR;  Service: General;  Laterality: N/A;   DIRECT LARYNGOSCOPY N/A 04/24/2014   Procedure: DIRECT LARYNGOSCOPY;  Surgeon: Ana LELON Moccasin, MD;  Location: [facility] OR;  Service: ENT;  Laterality: N/A;   FOREIGN BODY REMOVAL ESOPHAGEAL N/A 04/24/2014   Procedure: REMOVAL FOREIGN BODY ESOPHAGEAL;  Surgeon: Ana LELON Moccasin, MD;  [facility];  Service: ENT;  Laterality: N/A;   WISDOM  TOOTH EXTRACTION  2013   Family History Family History  Problem Relation Age of Onset   Arthritis Mother    Bronchitis Mother    Asthma Mother    Diabetes Neg Hx    Stomach cancer Neg Hx    Colon cancer Neg Hx     Social History Social History   Tobacco Use   Smoking status: Never   Smokeless tobacco: Never   Tobacco comments:    marjiuna  Vaping Use   Vaping status: Never Used  Substance Use Topics   Alcohol use: Not Currently    Alcohol/week: 6.0 standard drinks of alcohol    Types: 6 Cans of beer per week   Drug use: Not Currently    Types: Marijuana, Cocaine    Comment: last used before pregnancy   Allergies Bactrim  [sulfamethoxazole -trimethoprim ]  Review of Systems Review of Systems  Physical Exam Vital Signs  I have reviewed the triage vital signs BP 103/67   Pulse 98   Temp 98.6 F (37 C) (Oral)   Resp 18   LMP 10/20/2021   SpO2 100%   Physical Exam Vitals and nursing note reviewed.  Cardiovascular:     Rate and Rhythm: Normal rate.  Pulmonary:     Effort: Pulmonary effort is normal.  Abdominal:     General: Abdomen is flat.     Palpations: Abdomen is soft.     Tenderness: There is  no abdominal tenderness.  Skin:    General: Skin is warm.  Neurological:     General: No focal deficit present.     Mental Status: She is alert.     ED Results and Treatments Labs (all labs ordered are listed, but only abnormal results are displayed) Labs Reviewed  COMPREHENSIVE METABOLIC PANEL WITH GFR - Abnormal; Notable for the following components:      Result Value   Total Protein 8.3 (*)    ALT 55 (*)    Alkaline Phosphatase 130 (*)    All other components within normal limits  CBC WITH DIFFERENTIAL/PLATELET - Abnormal; Notable for the following components:   WBC 10.8 (*)    All other components within normal limits  URINALYSIS, ROUTINE W REFLEX MICROSCOPIC - Abnormal; Notable for the following components:   APPearance HAZY (*)    Specific Gravity, Urine 1.035 (*)    Ketones, ur 20 (*)    Protein, ur 30 (*)    All other components within normal limits  LIPASE, BLOOD                                                                                                                          Radiology US  Abdomen Limited RUQ (LIVER/GB) Result Date: 06/09/2024 CLINICAL DATA:  Previous cholecystectomy. Evaluate for bile duct stone. EXAM: ULTRASOUND ABDOMEN LIMITED RIGHT UPPER QUADRANT COMPARISON:  CT 05/28/2024 FINDINGS: Gallbladder: Previous cholecystectomy. Common bile duct: Diameter: 2.0 mm. Liver: Mild coarse increased echogenicity compatible with a degree of steatosis. No focal mass.  Portal vein is patent on color Doppler imaging with normal direction of blood flow towards the liver. Other: No free fluid. IMPRESSION: 1. No acute findings. 2. Previous cholecystectomy. 3. Mild hepatic steatosis. Electronically Signed   By: Toribio Agreste M.D.   On: 06/09/2024 13:35    Pertinent labs & imaging results that were available during my care of the patient were reviewed by me and considered in my medical decision making (see MDM for details).  Medications Ordered in ED Medications   metoCLOPramide  (REGLAN ) injection 10 mg (10 mg Intravenous Given 06/09/24 0936)  pantoprazole  (PROTONIX ) injection 40 mg (40 mg Intravenous Given 06/09/24 0933)  lactated ringers  bolus 1,000 mL (0 mLs Intravenous Stopped 06/09/24 1157)  droperidol  (INAPSINE ) 2.5 MG/ML injection 1.25 mg (1.25 mg Intravenous Given 06/09/24 1158)                                                                                                                                     Procedures Procedures  (including critical care time)  Medical Decision Making / ED Course   This patient presents to the ED for concern of abdominal pain and vomiting, this involves an extensive number of treatment options, and is a complaint that carries with it a high risk of complications and morbidity.  The differential diagnosis includes chronic abdominal pain, cyclic vomiting, gastritis, gastroparesis.  MDM: Patient is overall well-appearing.  She has normal vital signs.  Her abdomen is soft, nontender and nondistended.  Patient had CT imaging and ultrasound imaging on the seventh.  Given her reassuring vital signs, her history and her exam I do not believe it is warranted to repeat.  She denies any marijuana or EtOH use.  She has a history of a cholecystectomy, tubal ligation.  Will check labs, try some Reglan  and see if we get some improvement for the patient.  Reassessment 2:20 PM-patient had minimal improvement with Reglan , responded well to droperidol .  Obtain right upper quadrant ultrasound given patient's mild elevation in her LFTs and alk phos, showed mild hepatic steatosis, otherwise normal.   Additional history obtained:  -External records from outside source obtained and reviewed including: Chart review including previous notes, labs, imaging, consultation notes   Lab Tests: -I ordered, reviewed, and interpreted labs.   The pertinent results include:   Labs Reviewed  COMPREHENSIVE METABOLIC PANEL WITH GFR -  Abnormal; Notable for the following components:      Result Value   Total Protein 8.3 (*)    ALT 55 (*)    Alkaline Phosphatase 130 (*)    All other components within normal limits  CBC WITH DIFFERENTIAL/PLATELET - Abnormal; Notable for the following components:   WBC 10.8 (*)    All other components within normal limits  URINALYSIS, ROUTINE W REFLEX MICROSCOPIC - Abnormal; Notable for the following components:   APPearance HAZY (*)    Specific Gravity, Urine 1.035 (*)  Ketones, ur 20 (*)    Protein, ur 30 (*)    All other components within normal limits  LIPASE, BLOOD      EKG my independent review of the patient's EKG shows no ST segment depressions or elevations, no T wave inversions, no evidence of acute ischemia.  EKG Interpretation Date/Time:  Wednesday June 09 2024 09:55:51 EST Ventricular Rate:  89 PR Interval:  162 QRS Duration:  75 QT Interval:  393 QTC Calculation: 465 R Axis:   41  Text Interpretation: Sinus rhythm Ventricular premature complex Low voltage, precordial leads Borderline T abnormalities, anterior leads Confirmed by Mannie Pac [phone]) on 06/09/2024 10:44:42 AM         Imaging Studies ordered: I ordered imaging studies including right upper outer ultrasound I independently visualized and interpreted imaging. I agree with the radiologist interpretation   Medicines ordered and prescription drug management: Meds ordered this encounter  Medications   metoCLOPramide  (REGLAN ) injection 10 mg   pantoprazole  (PROTONIX ) injection 40 mg   lactated ringers  bolus 1,000 mL   droperidol  (INAPSINE ) 2.5 MG/ML injection 1.25 mg   metoCLOPramide  (REGLAN ) 10 MG tablet    Sig: Take 1 tablet (10 mg total) by mouth every 6 (six) hours.    Dispense:  30 tablet    Refill:  0   sucralfate  (CARAFATE ) 1 g tablet    Sig: Take 1 tablet (1 g total) by mouth 4 (four) times daily -  with meals and at bedtime.    Dispense:  120 tablet    Refill:  0    omeprazole  (PRILOSEC) 20 MG capsule    Sig: Take 1 capsule (20 mg total) by mouth daily.    Dispense:  30 capsule    Refill:  0    -I have reviewed the patients home medicines and have made adjustments as needed   Cardiac Monitoring: The patient was maintained on a cardiac monitor.  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     Final Clinical Impression(s) / ED Diagnoses Final diagnoses:  Nausea and vomiting, unspecified vomiting type     @PCDICTATION @    Mannie Pac T, DO 06/09/24 1430

## 2024-06-09 NOTE — Discharge Instructions (Signed)
 While you are in the emergency room, he had blood work done that overall was normal.  I have sent you a few prescriptions.  The first is Reglan .  You may take this medication for nausea once every 6 hours.  I have also sent your prescription for omeprazole , which is a medication to help with irritation of the stomach.  You may take this daily.  I have sent a prescription for medicine called Carafate , which may take up to 3 times per day after meals that can sometimes be helpful with abdominal pain and nausea.  I have included telephone number for a different GI group.  You may call them to make a follow-up appointment.

## 2024-06-09 NOTE — ED Triage Notes (Signed)
 Pt arrives via POV. PT reports abdominal pain, nausea, and vomiting for the past two weeks. States she was seen on the 7th for the same. PT is AxOx4.

## 2024-07-05 ENCOUNTER — Ambulatory Visit: Admitting: Obstetrics and Gynecology

## 2024-07-27 ENCOUNTER — Ambulatory Visit: Payer: Self-pay

## 2024-08-11 ENCOUNTER — Ambulatory Visit (INDEPENDENT_AMBULATORY_CARE_PROVIDER_SITE_OTHER): Payer: Self-pay

## 2024-08-11 DIAGNOSIS — F319 Bipolar disorder, unspecified: Secondary | ICD-10-CM | POA: Diagnosis not present

## 2024-08-11 NOTE — Progress Notes (Unsigned)
" ° °  THERAPIST PROGRESS NOTE  Virtual Visit via Video Note  I connected with Leah [name] on 08/11/24 at 10:00 AM EST by a video enabled telemedicine application and verified that I am speaking with the correct person using two identifiers.  Location: Patient: Location on file Provider: Remote office   I discussed the limitations of evaluation and management by telemedicine and the availability of in person appointments. The patient expressed understanding and agreed to proceed.   I discussed the assessment and treatment plan with the patient. The patient was provided an opportunity to ask questions and all were answered. The patient agreed with the plan and demonstrated an understanding of the instructions.   The patient was advised to call back or seek an in-person evaluation if the symptoms worsen or if the condition fails to improve as anticipated.  I provided 16 minutes of non-face-to-face time during this encounter.   Jacquetta Polhamus L. Raymona Boss, LCSW   Session Time: 9:00am - 9:16am  Participation Level: Minimal  Behavioral Response: NA Lethargic Negative  Type of Therapy: Individual Therapy  Treatment Goals addressed: Treatment plan will be developed at next session  ProgressTowards Goals: Treatment plan will be developed at next session  Interventions: CBT  Summary: Leah [name] is a 29 y.o. female who presents with ***.   Suicidal/Homicidal: No without intent/plan  Therapist Response: ***  Plan: Pt will call to schedule next appointment  Diagnosis: Bipolar 1 disorder, depressed (HCC)  Collaboration of Care: Medication Management AEB made referral to med mgmt for Bipolar  Patient/Guardian was advised Release of Information must be obtained prior to any record release in order to collaborate their care with an outside provider. Patient/Guardian was advised if they have not already done so to contact the registration department to sign all necessary forms in order for us  to  release information regarding their care.   Consent: Patient/Guardian gives verbal consent for treatment and assignment of benefits for services provided during this visit. Patient/Guardian expressed understanding and agreed to proceed.   Gean Larose L. Robynn, LCSW  08/11/2024  "

## 2024-08-24 ENCOUNTER — Other Ambulatory Visit: Payer: Self-pay

## 2024-08-24 ENCOUNTER — Ambulatory Visit: Admitting: Student

## 2024-08-24 ENCOUNTER — Other Ambulatory Visit (HOSPITAL_COMMUNITY): Admission: RE | Admit: 2024-08-24 | Discharge: 2024-08-24 | Disposition: A | Payer: Self-pay | Source: Ambulatory Visit

## 2024-08-24 ENCOUNTER — Encounter: Payer: Self-pay | Admitting: Student

## 2024-08-24 VITALS — BP 115/78 | HR 99 | Wt 174.4 lb

## 2024-08-24 DIAGNOSIS — Z113 Encounter for screening for infections with a predominantly sexual mode of transmission: Secondary | ICD-10-CM

## 2024-08-24 DIAGNOSIS — Z01419 Encounter for gynecological examination (general) (routine) without abnormal findings: Secondary | ICD-10-CM

## 2024-08-24 MED ORDER — ONDANSETRON 4 MG PO TBDP: 4.0000 mg | ORAL_TABLET | Freq: Three times a day (TID) | ORAL | 1 refills | Status: AC | PRN

## 2024-08-24 NOTE — Patient Instructions (Signed)
 AREA FAMILY PRACTICE PHYSICIANS  [place] ([id number]) [facility] [address] [place], [place] [address] [phone] Mon-Fri 8:30-12:30, 1:30-5:00 Accepting [facility] Medicine at [facility] [address], [place], [place] [address] [phone] Mon-Fri 8:00-5:30 [facility] [address] [place], [place] [address] [phone] [place] Has, Thur, Fri 8:30-5:00, Wed 10:00-7:00 (closed 1-2pm) Accepting [facility] [address], [place], [place]  [address] Phone - [phone]   Fax - [phone]  [place] [phone]) [facility] Medicine [address] [place], [place] [address] [phone] Mon-Fri 8:00-5:00 [facility] - [facility]s at [facility])  [address] Sherian Maroon [place], [place] [address] [phone] Mon-Fri 8:30-5:30, Sat (Oct.-Mar.) 9:00-1:00 Accepting Medicaid  [place] [phone]) [facility] Family [facility] at [facility] [address], [place], [place] [address] [phone] Mon-Fri 8:00-5:00  [place] [phone]) [facility] at [facility] [address] [place], [place] [address] [phone] Mon-Fri 8:00-5:00 [facility] [place] [address] [place], [place], [place] [address] [phone] Mon-Fri 8:00-5:00 [place] [facility] [address] [place] Cloud [place], [place] [address] [phone] Mon-Fri 8:00-5:00 [facility] [address] [place] [address] [phone] Mon-Fri 7:30-5:30  [place] [phone] & [phone]) [facility] [address], [place], [place] [address] [phone] Mon-Thur 8:00-6:00 Accepting [facility] [address] [place], [place] [address] [phone] Mon-Thur 7:30-7:30, Fri 7:30-4:30 Accepting  [facility] Family Medicine at [facility] [address] [place], [place]  [address] [phone]   Fax - [phone]  [place] [phone] & [phone]) Adult nurse HealthCare at [facility] [address] [place], [place] [address] [phone] Mon-Fri 7:00-5:00 [facility] Family Medicine [address], [place], [place] [address] [phone] Mon-Fri 8:00-5:00 Accepting Medicaid [facility] - [facility] [address] [place], [place], [place] [address] [phone] Mon-Fri 8:00-5:00 Accepting Medicaid  [place] [phone]) [facility] Primary Care at [facility] [address] [place], [place] [address] [phone] Mon-Fri 8:00-5:00 [facility] - [facility] at [facility]) [address], [place], [place] [address] [phone] Mon-Fri 8:00-5:00 Accepting Medicaid [facility] [employer] [facility]) [address], [place], [place] [address] [phone] Mon-Fri 8:00-5:30, Sat&Sun by appointment (phones open at 8:30) Accepting [facility] [phone] & [phone]) [facility] Family Medicine [address] [place], [place] [address] [phone] Mon-Thur 8:00-7:00, Fri 8:00-5:00, Sat 8:00-12:00, Sun 9:00-12:00 Accepting Medicaid [facility] - [facility] at [facility] [address] [place], [place] [address] [phone] Mon-Thur 8:00-5:00 Accepting Medicaid [facility] - [facility] Medicine at [place] [address] Sherian Maroon [place], [place] [address] [phone] Mon-Fri 8:00-5:30, Sat (Oct.-Mar.) 9:00-1:00 Accepting [facility]  [place] [phone]) [facility] Medicine [name] [place], [place] [address] [phone] Mon-Fri 8:00-5:00 Accepting [facility]   [place] [phone]) [address] [place] [facility] at [facility] [address], [place], [place] [address] [phone] Mon-Fri 8:00-5:00 [place] [facility] [address] [place], [place] [address] [phone] Mon-Fri 8:00-5:00 [facility] - [place] [address], [place], [place] [address] [phone] Mon-Fri 8:00-5:00 After hours clinic [facility] [place], [place] [address]) [phone] Mon-Fri 5:00-8:00, Sat 12:00-6:00, Sun 10:00-4:00 Accepting Medicaid [facility] at [facility]  [address], [place], [place]  [address] [phone]   Fax - [phone]  [place] [phone]) Adult nurse HealthCare at [facility] [address] [place], [place], [place] [address] [phone] Mon-Fri 8:00-5:00 [facility] Family Medicine - [place] [facility]) [address] [place] [address] [place], [place] [address] [phone] Mon-Thur 8:00-7:00, Fri 8:00-5:00, Sat 8:00-12:00

## 2024-08-25 LAB — HCV INTERPRETATION

## 2024-08-25 LAB — SYPHILIS: RPR W/REFLEX TO RPR TITER AND TREPONEMAL ANTIBODIES, TRADITIONAL SCREENING AND DIAGNOSIS ALGORITHM: RPR Ser Ql: NONREACTIVE

## 2024-08-25 LAB — HEPATITIS B SURFACE ANTIGEN: Hepatitis B Surface Ag: NEGATIVE

## 2024-08-25 LAB — HCV AB W REFLEX TO QUANT PCR: HCV Ab: NONREACTIVE

## 2024-08-25 LAB — HIV ANTIBODY (ROUTINE TESTING W REFLEX): HIV Screen 4th Generation wRfx: NONREACTIVE

## 2024-08-26 ENCOUNTER — Other Ambulatory Visit: Payer: Self-pay | Admitting: Student

## 2024-08-26 ENCOUNTER — Ambulatory Visit: Payer: Self-pay | Admitting: Student

## 2024-08-26 DIAGNOSIS — B3731 Acute candidiasis of vulva and vagina: Secondary | ICD-10-CM

## 2024-08-26 LAB — CERVICOVAGINAL ANCILLARY ONLY
Bacterial Vaginitis (gardnerella): NEGATIVE
Candida Glabrata: NEGATIVE
Candida Vaginitis: POSITIVE — AB
Chlamydia: NEGATIVE
Comment: NEGATIVE
Comment: NEGATIVE
Comment: NEGATIVE
Comment: NEGATIVE
Comment: NEGATIVE
Comment: NORMAL
Neisseria Gonorrhea: NEGATIVE
Trichomonas: NEGATIVE

## 2024-08-26 MED ORDER — FLUCONAZOLE 150 MG PO TABS: 150.0000 mg | ORAL_TABLET | Freq: Once | ORAL | 0 refills | Status: AC

## 2024-10-12 ENCOUNTER — Ambulatory Visit: Payer: Self-pay
